# Patient Record
Sex: Female | Born: 1945
Health system: Southern US, Community
[De-identification: ages and names within clinical notes are randomized; demographics above are authoritative.]

## PROBLEM LIST (undated history)

## (undated) DIAGNOSIS — E039 Hypothyroidism, unspecified: Secondary | ICD-10-CM

## (undated) DIAGNOSIS — M1990 Unspecified osteoarthritis, unspecified site: Secondary | ICD-10-CM

## (undated) DIAGNOSIS — R011 Cardiac murmur, unspecified: Secondary | ICD-10-CM

## (undated) DIAGNOSIS — E7800 Pure hypercholesterolemia, unspecified: Secondary | ICD-10-CM

## (undated) DIAGNOSIS — Z8489 Family history of other specified conditions: Secondary | ICD-10-CM

## (undated) DIAGNOSIS — J449 Chronic obstructive pulmonary disease, unspecified: Secondary | ICD-10-CM

## (undated) DIAGNOSIS — I10 Essential (primary) hypertension: Secondary | ICD-10-CM

## (undated) DIAGNOSIS — N189 Chronic kidney disease, unspecified: Secondary | ICD-10-CM

## (undated) DIAGNOSIS — K219 Gastro-esophageal reflux disease without esophagitis: Secondary | ICD-10-CM

## (undated) HISTORY — PX: HIP SURGERY: SHX245

## (undated) HISTORY — PX: THYROID SURGERY: SHX805

## (undated) HISTORY — PX: EYE SURGERY: SHX253

## (undated) HISTORY — PX: TUBAL LIGATION: SHX77

## (undated) HISTORY — PX: BREAST CYST EXCISION: SHX579

## (undated) HISTORY — PX: TONSILLECTOMY: SUR1361

## (undated) HISTORY — PX: JOINT REPLACEMENT: SHX530

## (undated) HISTORY — PX: HEMORRHOID SURGERY: SHX153

---

## 2004-01-17 ENCOUNTER — Encounter: Admission: RE | Admit: 2004-01-17 | Discharge: 2004-01-17 | Payer: Self-pay | Admitting: Orthopedic Surgery

## 2004-03-12 ENCOUNTER — Inpatient Hospital Stay (HOSPITAL_COMMUNITY): Admission: RE | Admit: 2004-03-12 | Discharge: 2004-03-16 | Payer: Self-pay | Admitting: Orthopedic Surgery

## 2004-03-12 ENCOUNTER — Encounter (INDEPENDENT_AMBULATORY_CARE_PROVIDER_SITE_OTHER): Payer: Self-pay | Admitting: *Deleted

## 2008-06-16 ENCOUNTER — Emergency Department (HOSPITAL_BASED_OUTPATIENT_CLINIC_OR_DEPARTMENT_OTHER): Admission: EM | Admit: 2008-06-16 | Discharge: 2008-06-16 | Payer: Self-pay | Admitting: Emergency Medicine

## 2010-08-01 ENCOUNTER — Emergency Department (HOSPITAL_BASED_OUTPATIENT_CLINIC_OR_DEPARTMENT_OTHER): Admission: EM | Admit: 2010-08-01 | Discharge: 2010-08-01 | Payer: Self-pay | Admitting: Emergency Medicine

## 2011-01-16 NOTE — Discharge Summary (Signed)
NAME:  Alexandria Durham, Alexandria Durham                          ACCOUNT NO.:  [id number]   MEDICAL RECORD NO.:  [id number]                   PATIENT TYPE:  INP   LOCATION:  5040                                 FACILITY:  [facility]   PHYSICIAN:  Dyke Brackett, M.D.                 DATE OF BIRTH:  December 08, 1945   DATE OF ADMISSION:  03/12/2004  DATE OF DISCHARGE:  03/16/2004                                 DISCHARGE SUMMARY   ADMISSION DIAGNOSES:  1.  Avascular necrosis, bilateral femoral heads.  2.  Uncontrolled hypertension.  3.  Gastroesophageal reflux disease.  4.  Hypercholesterolemia.  5.  Eczema.  6.  Thyroid goiter.  7.  Sinus congestion.   DISCHARGE DIAGNOSES:  1.  Bilateral avascular necrosis femoral heads, status post right hip      arthroplasty.  2.  Right lesser trochanteric fracture, nondisplaced.  3.  Acute blood loss anemia secondary to surgery.  4.  Hypertension, uncontrolled.  5.  Gastroesophageal reflux disease.  6.  Hypercholesterolemia.  7.  Eczema.  8.  Thyroid goiter.  9.  Sinus congestion.   SURGICAL PROCEDURE:  On March 12, 2004, [name] underwent a right total  hip arthroplasty by Dr. Marcie Mowers, assisted by Jamelle Rushing, P.A.  She had a Pinnacle 100 series acetabular cup, size 48-mm, with an Apex hole  eliminator and then a Pinnacle Marathon acetabular liner lipped 48-mm outer  diameter, 28-mm inner diameter.  A Prodigy hip stem with Porocoat 13.5-mm  diameter, small stature with an Articulose femoral head 28-mm plus 5 neck  1214 cone.   COMPLICATIONS:  She did have a lesser trochanter calcar fracture that was  nondisplaced on the right hip.   CONSULTS:  1.  On March 13, 2004, she had a case management, physical therapy, and rehab      medicine consult.  2.  Occupational therapy consult, March 15, 2004.   HISTORY OF PRESENT ILLNESS:  This 65 year old black female patient presented  to Dr. Madelon Lips with a history of bilateral avascular necrosis of her  femoral  heads.  She did have a vascularized fibular graft done by Dr. Jiles Garter at  [facility] five years ago but the hip became infected.  Right hip pain has been  worse since that time.  It is a throbbing, burning sensation with radiation  to her right ankle.  The leg gives way at times.  She is unable to walk or  stand for long periods of time.  She has failed conservative treatment and  because of that she wishes to proceed with a right hip replacement.   HOSPITAL COURSE:  [name] tolerated her surgical procedure well and was  subsequently transferred to 5,000.  Post-op day #1, she was afebrile, vitals  stable.  Hemoglobin 11.6, hematocrit 33.  Right hip incision was well  approximated with staples without drainage.  Hemoglobin was low at 11.6  with  hematocrit of 33 and that was monitored.  Post-op day #2, she was feeling  better.  T-max 100, vitals stable.  Hemoglobin 10, hematocrit 29.3.  Right  hip wound benign.  She was switched to p.o. pain meds and continued on  therapy.  Post-op day #3, she was having some difficulty with nausea and  reflux.  She was started on Protonix.  She was having some constipation  which was treated effectively with meds.  Continued on therapy.  She  continued to do well over the next day or two.  It was felt she was ready  for discharge home on March 16, 2004, and was D/C'd home later that day.   DISCHARGE INSTRUCTIONS:   DIET:  She may resume her regular pre-hospitalization diet.   MEDICATIONS:  She may resume her pre-hospitalization meds with the exception  of her Darvocet, Celebrex, Motrin, Advil, and Mobic.  Other home meds were:  1.  Lipitor 10 mg p.o. q.a.m.  2.  Benicar 20 mg p.o. q.a.m.  3.  Hydroxyzine as needed p.r.n.  4.  Triamcinolone 0.1% cream apply to effected areas 1-2 times a day.   Additional medications include:  1.  Lovenox 40 mg subcu q.a.m. at 10 a.m.  2.  Protonix 40 mg p.o. q.a.m.  3.  Percocet 5/325 mg 1-2 tablets p.o.  q.4-6h. p.r.n. for pain.  4.  Robaxin 500 mg 1-2 tablets p.o. q.6-8h. p.r.n. for spasms.  5.  OxyContin 10 mg one tablet p.o. q.12h.  6.  Iron 325 mg one tablet p.o. q.a.m. for 30 days.   ACTIVITY:  She can be out of bed, partial weightbearing 50% or less on the  right leg with the use of a walker.  She is arranged for hom health PT per  [facility].   WOUND CARE:  She is to keep the right hip incision clean and dry.  She needs  to change the dressing daily and may shower after no drainage from the wound  for two days.  Please notify Dr. Madelon Lips if temp greater than 101.5, chills,  pain unrelieved by pain meds, or foul-smelling drainage from the wound.   FOLLOW UP:  1.  She needs to follow up with Dr. Madelon Lips in our office in approximately      11 days and needs to call [phone] for that appointment.  2.  She needs to follow up with her primary care doctor for her heartburn      and anemia complaints.   LABORATORY DATA:  X-ray taken of her right femur, on March 12, 2004, showed  normal alignment of the femoral stem of the hip prosthesis with no bony  fracture, alignment at the level of the knee joint is anatomic.   On March 13, 2004, hemoglobin 11.6, hematocrit 33, on the 15th white count  10.9, hemoglobin 10, hematocrit 29.3.  On March 15, 2004, white count 10.2,  hemoglobin 10.2, hematocrit 29.5, and platelets 235.  On March 10, 2004,  glucose 116.  On March 13, 2004, sodium 134, potassium 4.2, glucose 129.  On  March 14, 2004, glucose 129.  All cultures taken, on March 12, 2004, showed no  white cells, no organisms.  All other laboratory studies were within normal  limits.      Legrand Pitts Duffy, Arnetha Courser, M.D.    KED/MEDQ  D:  04/29/2004  T:  04/29/2004  Job:  [id number]   cc:   Casey Burkitt  [address]  [place]  [place] [address]  Fax: [phone]

## 2011-01-16 NOTE — H&P (Signed)
NAME:  Alexandria Durham, Alexandria Durham NO.:  [id number]   MEDICAL RECORD NO.:  [id number]                   PATIENT TYPE:  INP   LOCATION:                                       FACILITY:  [facility]   PHYSICIAN:  Dyke Brackett, M.D.                 DATE OF BIRTH:  03-02-1946   DATE OF ADMISSION:  03/12/2004  DATE OF DISCHARGE:                                HISTORY & PHYSICAL   CHIEF COMPLAINT:  My right hip pain.   HISTORY OF PRESENT ILLNESS:  Ms.  Durham is a 65 year old African American  female with bilateral AVN of her hips.  The patient has a history of  vascularized fibula graft that was done by Dr. Patric Dykes at [facility]  approximately five years ago.  The hip subsequently became infected.  The  patient states that the right hip pain was worse after surgery.  She  describes the right hip pain as a throbbing burning pain that radiates down  to her right ankle.  She denies any numbness or tingling in the right lower  extremity.  The right hip does give way at times.  The pain is worse after  sitting for a prolonged period of time or lying in bed on the right side.  She is unable to walk or stand for long periods of time due to the right hip  pain.  The pain does awaken her at night.  The pain is better with heat, and  she did get fair relief with Vioxx; however, since it has been removed from  the market, she has found no other NSAIDs that help with her pain.  She uses  a cane and/or walker as needed to ambulate.  X-rays of the right hip show  progressive arthritic changes.  The patient, therefore, is to undergo a  right total hip replacement at [facility] on March 12, 2004.   ALLERGIES:  1. ASPIRIN.  2. CODEINE.  Both cause tachycardia and sweating.   MEDICATIONS:  1. Lipitor 10 mg one p.o. every day.  2. Benicar 20 mg one p.o. every day.  3. Propoxyphene 100 p.r.n. max is six tablets a day.  4. Triamcinolone 0.1% apply to effected areas once to twice a  day.  5. Hydroxyzine p.r.n.  6. Celebrex 200 mg 1-2 p.o. every day p.r.n.  7. NSAID p.r.n. i.e., Mobic, Advil.   PAST MEDICAL HISTORY:  1. Hypertension.  2. GERD.  3. Hypercholesterolemia.  4. Eczema.  5. Thyroid goiter.  6. Sinus complaints.  The patient denies any coronary artery disease, diabetes mellitus,  neurologic, or hematologic medical issues.   PAST SURGICAL HISTORY:  1. Tonsillectomy.  2. Cyst removal, right breast.  3. Surgery for hemorrhoids.  4. Tubal ligation x 2.  5. Vascularized fibula graft, right hip.  The patient denies any problems with anesthesia with any of the above  procedures and did not require any blood products.   SOCIAL HISTORY:  The patient smokes presently one pack of cigarettes a day.  She denies any alcohol use.  She is married with three grown children.  The  patient lives in a two-story home with five-steps into the usual entrance.  Bedroom on the second floor.  The patient will move to downstairs after  surgery.  The patient works as an Production designer, theatre/television/film  __________ is employed at  the present time.   PRIMARY CARE PHYSICIAN:  Dr. Casey Burkitt in [place], [place].   FAMILY HISTORY:  Mother deceased at age 90 due to a CVA.  She had a history  of heart failure, hypertension.  Father deceased at age 40.  He had a  gastric ulcer.  The patient has a total of six siblings, three brothers and  three sisters.  One brother deceased accidentally.  Of the other two  brothers, one has hypertension.  The patient has one sister who is deceased  due to CVA and two sisters one has hypertension, the other has eczema,  asthma, coma, cataracts both eyes, questionable diabetes mellitus.   REVIEW OF SYSTEMS:  The patient denies any recent cold, cough, fever, flu  like symptoms.  She denies any chest pain, shortness of breath, PND,  orthopnea.  She has severely dry nasal mucosa, has post nasal drainage and  itchy eyes.  She does have reflux, however,  has no problems with the reflux  since being on Aciphex.  The patient denies any other GI or GU problems.   PHYSICAL EXAMINATION:  GENERAL:  The patient is a well developed well  nourished female who walks with an antalgic gait and limp on the right.  The  patient's mood and affect are appropriate.  She talks easily with examiner.  VITAL SIGNS:  The patient's approximate height is 5'6, weight 186 pounds.  Temp is 97.2, blood pressure is 160/100, repeat blood pressure 160/92, pulse  78, respiratory rate 16.  CARDIAC:  Regular rate and rhythm.  No murmurs, rubs or gallops noted.  LUNGS:  Clear to auscultation bilaterally.  No wheezing, rhonchi, or rales  are noted.  ABDOMEN:  Soft, round, nontender.  Positive bowel sounds x 4 quadrants.  BACK:  Nontender to palpation over the thoracic and lumbar spine.  NECK:  Trachea is midline.  No lymphadenopathy is noted.  Carotids are 2+  without bruits.  No tenderness on the cervical spine.  The patient has full  range of motion of the cervical spine.  BREAST/GENITOURINARY/RECTAL:  Deferred at this time.  NEUROLOGIC:  The patient is alert and oriented  x 3.  Cranial nerves II-XII  are grossly intact.  Upper and lower extremity strength testing reveals 5/5  strength against resistance bilaterally.  Deep tendon reflexes 2+  bilaterally in the upper and lower extremities.  MUSCULOSKELETAL:  Upper extremities are equal in size and shape.  The  patient's right shoulder forward flexion 170 degrees, abduction 180 degrees,  however, it is painful.  External rotation of the right shoulder is also  painful.  The left shoulder forward flexion 180 degrees, abduction 180  degrees.  Elbows, wrists, hands full range of motion.  Upper extremities are  neuromotor and vascular intact.  Lower extremities:  Left hip full range of  motion.  Right hip limited internal rotation.  External rotation approximately 40-45 degrees.  The lower extremities bilaterally are   neuromotor and vascular intact.  The patient does have  some right greater  trochanteric region of pain and some right SI joint pain.  EHL and FHL  intact bilaterally.  Dorsal pedal pulses are 2+ bilaterally.  There is no  edema in either of the lower extremities.   IMPRESSION:  1. Bilateral avascular necrosis, right hip pain greater than left.  X-rays     of the right hip show progressive arthritic changes.  2. Hypertension, uncontrolled.  3. Gastroesophageal reflux disease.  4. Hypercholesterolemia.  5. Eczema.  6. Thyroid goiter.  7. __________ complaints.   PLAN:  The patient is to be admitted to [facility] on March 12, 2004.  The patient is to undergo all preoperative lab work and testing prior  to surgery.  The patient will follow up with her primary care physician, Dr.  Lorri Frederick, prior to surgery to gain better control of her hypertension.  The  patient then will undergo a right total hip arthroplasty by Dr. Madelon Lips.      Richardean Canal, Arnetha Courser, M.D.    GC/MEDQ  D:  03/04/2004  T:  03/04/2004  Job:  [id number]

## 2011-01-16 NOTE — Op Note (Signed)
NAME:  Alexandria Durham, Alexandria Durham NO.:  [id number]   MEDICAL RECORD NO.:  [id number]                   PATIENT TYPE:  INP   LOCATION:  2899                                 FACILITY:  [facility]   PHYSICIAN:  Thera Flake., M.D.             DATE OF BIRTH:  08-Apr-1946   DATE OF PROCEDURE:  03/12/2004  DATE OF DISCHARGE:                                 OPERATIVE REPORT   PREOPERATIVE DIAGNOSIS:  Status post vascularized fibular bone graft with  failure of above.   POSTOPERATIVE DIAGNOSIS:  Status post vascularized fibular bone graft with  failure of above.   OPERATION PERFORMED:  Conversion of vascularized bone graft with removal of  pin and vascularized fibula and conversion to total hip replacement, right-  sided (Depuy Prodigy stem 13.5 narrow, +5 mm neck length, 28 mm head with 48  mm acetabulum).   SURGEON:  Dyke Brackett, M.D.   ASSISTANT:  Jamelle Rushing, P.A.   ANESTHESIA:  General.   ESTIMATED BLOOD LOSS:  Approximately 200 mL.   DESCRIPTION OF PROCEDURE:  The patient was thoroughly prepped and draped in  the lateral position.  She had a curvilinear incision made, straight skin  incision on the thigh with posterior approach to the hip.  She had the  iliotibial band split, identification of the tendon to fibula which was  removed, pin in the femur from the fibular bone graft which was removed.  The neck was cut conservatively but there was really very little neck left  relative to collapse of the femur and the previous bone graft.  Again, there  was a lot of excessive bone from the cortical screw to the fibula requiring  careful removal of the fibular bone graft with reamers to avoid any  eccentric placement of the reamer down the canal.  The head was cut, sent to  pathology.  There was a previous history of the patient having had an  infection after a vascularized bone graft but frozen section of the tissue  showed only a few polys, nothing  significant for high powered field.  Additionally, there was no evidence of gross purulence; however,  representative tissue was sent for permanent culture as well as to pathology  in terms of the bone.  Progressive reaming and rasping up.  Prosthesis had  to be placed probably about 1/2 fingerbreadth proud due to very hard bone  and a good distal fit reamed up to 13.0 to accept a 13.5 mm stem.  Acetabulum was identified.  Excess soft tissue was removed from the  acetabulum.  The acetabulum itself was moderately degenerative.  Again, it  was reamed up 47 mm to accept a 48 mm prosthesis down to good bleeding bone  with an appropriate amount of anteversion and abduction.  Excellent  stability was obtained although obviously given the size of the acetabulum,  we could only use a 28  mm head but really it was very difficult to dislocate  this head once the trials were placed with a +5 mm neck length.  Final  components were inserted after the trial acetabular followed by femur.  On  insertion of the femoral prosthesis, there was a small crack noted in the  calcar; however, plain film examination of the entire femoral canal did not  demonstrate any propagation of this calcar crack into the shaft of bone.  Closure was effected on the capsule with #1 Ethibond on the iliotibial band,  2-0 Vicryl, skin clips, Marcaine with epinephrine infiltrated in the skin.  Lightly compressive sterile dressing applied.                                               Thera Flake., M.D.    WDC/MEDQ  D:  03/12/2004  T:  03/12/2004  Job:  [id number]

## 2011-03-31 ENCOUNTER — Encounter: Payer: Self-pay | Admitting: Emergency Medicine

## 2011-03-31 ENCOUNTER — Emergency Department (HOSPITAL_BASED_OUTPATIENT_CLINIC_OR_DEPARTMENT_OTHER)
Admission: EM | Admit: 2011-03-31 | Discharge: 2011-03-31 | Disposition: A | Discharge status: Home / Self Care | Payer: Self-pay | Attending: Emergency Medicine | Admitting: Emergency Medicine

## 2011-03-31 ENCOUNTER — Emergency Department (INDEPENDENT_AMBULATORY_CARE_PROVIDER_SITE_OTHER): Payer: Self-pay

## 2011-03-31 DIAGNOSIS — J029 Acute pharyngitis, unspecified: Secondary | ICD-10-CM | POA: Insufficient documentation

## 2011-03-31 DIAGNOSIS — I10 Essential (primary) hypertension: Secondary | ICD-10-CM

## 2011-03-31 DIAGNOSIS — R05 Cough: Secondary | ICD-10-CM | POA: Insufficient documentation

## 2011-03-31 DIAGNOSIS — R0989 Other specified symptoms and signs involving the circulatory and respiratory systems: Secondary | ICD-10-CM

## 2011-03-31 DIAGNOSIS — J40 Bronchitis, not specified as acute or chronic: Secondary | ICD-10-CM | POA: Insufficient documentation

## 2011-03-31 DIAGNOSIS — R059 Cough, unspecified: Secondary | ICD-10-CM | POA: Insufficient documentation

## 2011-03-31 DIAGNOSIS — R5381 Other malaise: Secondary | ICD-10-CM

## 2011-03-31 HISTORY — DX: Essential (primary) hypertension: I10

## 2011-03-31 HISTORY — DX: Pure hypercholesterolemia, unspecified: E78.00

## 2011-03-31 MED ORDER — AZITHROMYCIN 250 MG PO TABS: 250.0000 mg | ORAL_TABLET | Freq: Every day | ORAL | Status: AC

## 2011-03-31 NOTE — ED Notes (Signed)
Pt states she has been having productive cough x 1 month.  Sore throat since yesterday.  No known fever, No SOB.

## 2011-03-31 NOTE — ED Provider Notes (Signed)
Medical screening examination/treatment/procedure(s) were performed by non-physician practitioner and as supervising physician I was immediately available for consultation/collaboration.   Charles B. Bernette Mayers, MD 03/31/11 1336

## 2011-03-31 NOTE — ED Provider Notes (Signed)
History     Chief Complaint  Patient presents with  . Sore Throat  . Cough   Patient is a 65 y.o. female presenting with pharyngitis and cough. The history is provided by the patient.  Sore Throat This is a new problem. The current episode started in the past 7 days. The problem occurs constantly. The problem has been gradually worsening. Associated symptoms include congestion, coughing and a sore throat. Pertinent negatives include no fever. The symptoms are aggravated by nothing. She has tried nothing for the symptoms. The treatment provided no relief.  Cough This is a new problem. The current episode started more than 1 week ago. The problem occurs constantly. The problem has been gradually improving. There has been no fever. Associated symptoms include rhinorrhea and sore throat. She is not a smoker.    Past Medical History  Diagnosis Date  . Hypertension   . Diabetes mellitus   . Hypercholesteremia     Past Surgical History  Procedure Date  . Tonsillectomy   . Joint replacement     hip replacement  . Tubal ligation     History reviewed. No pertinent family history.  History  Substance Use Topics  . Smoking status: Current Everyday Smoker -- 0.5 packs/day for 25 years  . Smokeless tobacco: Not on file  . Alcohol Use: No    OB History    Grav Para Term Preterm Abortions TAB SAB Ect Mult Living                  Review of Systems  Constitutional: Negative for fever.  HENT: Positive for congestion, sore throat and rhinorrhea.   Respiratory: Positive for cough.   All other systems reviewed and are negative.    Physical Exam  BP 127/57  Pulse 87  Temp(Src) 98.1 F (36.7 C) (Oral)  SpO2 99%  Physical Exam  Constitutional: She is oriented to person, place, and time. She appears well-developed and well-nourished.  HENT:  Head: Normocephalic and atraumatic.  Eyes: Conjunctivae and EOM are normal. Pupils are equal, round, and reactive to light.  Neck: Normal  range of motion. Neck supple.  Cardiovascular: Normal rate.   Pulmonary/Chest: Effort normal and breath sounds normal.  Abdominal: Soft.  Musculoskeletal: Normal range of motion.  Neurological: She is alert and oriented to person, place, and time. She has normal reflexes.  Skin: Skin is warm and dry.  Psychiatric: She has a normal mood and affect.    ED Course  Procedures  MDM Chest xray no pneumonia    Results for orders placed during the hospital encounter of 08/01/10  GLUCOSE, CAPILLARY      Component Value Range   Glucose-Capillary 110 (*) 70 - 99 (mg/dL)   Comment 1 Notify RN     Dg Chest 2 View  03/31/2011  *RADIOLOGY REPORT*  Clinical Data: Coughing.  Congestion.  Weakness.  Hypertension. Diabetes.  Tobacco smoker.  CHEST - 2 VIEW  Comparison: 03/10/2004.  Findings: The cardiac silhouette is normal size and shape. Mediastinal and hilar contours appear stable. No pleural abnormality is evident.  Lungs are free of infiltrates.  There is minimal degenerative spondylosis.  IMPRESSION: No cardiopulmonary abnormalities are identified.  Original Report Authenticated By: Crawford Givens, M.D.      Langston Masker, [place] 03/31/11 1330

## 2011-04-04 ENCOUNTER — Encounter (HOSPITAL_BASED_OUTPATIENT_CLINIC_OR_DEPARTMENT_OTHER): Payer: Self-pay | Admitting: *Deleted

## 2011-04-04 ENCOUNTER — Emergency Department (HOSPITAL_BASED_OUTPATIENT_CLINIC_OR_DEPARTMENT_OTHER)
Admission: EM | Admit: 2011-04-04 | Discharge: 2011-04-04 | Disposition: A | Discharge status: Home / Self Care | Payer: Self-pay | Attending: Emergency Medicine | Admitting: Emergency Medicine

## 2011-04-04 DIAGNOSIS — E119 Type 2 diabetes mellitus without complications: Secondary | ICD-10-CM | POA: Insufficient documentation

## 2011-04-04 DIAGNOSIS — I10 Essential (primary) hypertension: Secondary | ICD-10-CM | POA: Insufficient documentation

## 2011-04-04 DIAGNOSIS — T363X5A Adverse effect of macrolides, initial encounter: Secondary | ICD-10-CM | POA: Insufficient documentation

## 2011-04-04 DIAGNOSIS — F17200 Nicotine dependence, unspecified, uncomplicated: Secondary | ICD-10-CM | POA: Insufficient documentation

## 2011-04-04 DIAGNOSIS — T783XXA Angioneurotic edema, initial encounter: Secondary | ICD-10-CM | POA: Insufficient documentation

## 2011-04-04 DIAGNOSIS — E7800 Pure hypercholesterolemia, unspecified: Secondary | ICD-10-CM | POA: Insufficient documentation

## 2011-04-04 MED ORDER — DIPHENHYDRAMINE HCL 25 MG PO CAPS
ORAL_CAPSULE | ORAL | Status: AC
  Filled 2011-04-04: qty 1

## 2011-04-04 MED ORDER — DIPHENHYDRAMINE HCL 25 MG PO CAPS
ORAL_CAPSULE | ORAL | Status: AC
  Administered 2011-04-04: 50 mg via ORAL
  Filled 2011-04-04: qty 2

## 2011-04-04 MED ORDER — PREDNISONE 20 MG PO TABS
60.0000 mg | ORAL_TABLET | Freq: Once | ORAL | Status: AC
  Administered 2011-04-04: 60 mg via ORAL
  Filled 2011-04-04: qty 3

## 2011-04-04 MED ORDER — DIPHENHYDRAMINE HCL 25 MG PO TABS: 25.0000 mg | ORAL_TABLET | Freq: Four times a day (QID) | ORAL | Status: AC

## 2011-04-04 MED ORDER — FAMOTIDINE 20 MG PO TABS: 20.0000 mg | ORAL_TABLET | Freq: Two times a day (BID) | ORAL | Status: DC

## 2011-04-04 MED ORDER — FAMOTIDINE 20 MG PO TABS
20.0000 mg | ORAL_TABLET | Freq: Once | ORAL | Status: AC
  Administered 2011-04-04: 20 mg via ORAL
  Filled 2011-04-04: qty 1

## 2011-04-04 MED ORDER — PREDNISONE 10 MG PO TABS: 20.0000 mg | ORAL_TABLET | Freq: Every day | ORAL | Status: AC

## 2011-04-04 MED ORDER — DIPHENHYDRAMINE HCL 50 MG PO CAPS
50.0000 mg | ORAL_CAPSULE | Freq: Once | ORAL | Status: AC
  Administered 2011-04-04: 50 mg via ORAL
  Filled 2011-04-04: qty 1

## 2011-04-04 NOTE — ED Notes (Signed)
Pt reports swelling to lips since yesterday. States seen at [facility] and given Zithromax, has been taking since Tuesday. No difficulty breathing, no rash reported.

## 2011-04-04 NOTE — ED Provider Notes (Signed)
History     CSN: [id number] Arrival date & time: 04/04/2011  2:42 AM  Chief Complaint  Patient presents with  . Allergic Reaction   Patient is a 65 y.o. female presenting with allergic reaction. The history is provided by the patient and a relative.  Allergic Reaction The primary symptoms are  angioedema. The primary symptoms do not include wheezing, shortness of breath, cough, abdominal pain, nausea, vomiting, diarrhea, dizziness, palpitations, altered mental status, rash or urticaria. The current episode started yesterday. The problem has not changed since onset. The angioedema is not associated with shortness of breath.  The onset of the reaction was associated with a new medication. Significant symptoms that are not present include eye redness, flushing or itching.  STARTED ZITHROMAX ON Tuesday BUT SX DID NOT START UNTIL Friday. ALSO STARTED LISINOPRIL 7 MONTHS AGO FOR HTN. SX ISOLATED TO JUST THE UPPER LIP BEING SWOLLEN. THE ZITHROMAX WAS FOR BRONCHITIS AND CXR WAS NEGATIVE. ONLY HAS ONE DOSE LAST.   Past Medical History  Diagnosis Date  . Hypertension   . Diabetes mellitus   . Hypercholesteremia     Past Surgical History  Procedure Date  . Tonsillectomy   . Joint replacement     hip replacement  . Tubal ligation     History reviewed. No pertinent family history.  History  Substance Use Topics  . Smoking status: Current Everyday Smoker -- 0.5 packs/day for 25 years  . Smokeless tobacco: Not on file  . Alcohol Use: No    OB History    Grav Para Term Preterm Abortions TAB SAB Ect Mult Living                  Review of Systems  Constitutional: Negative for fever.  HENT: Positive for facial swelling. Negative for congestion, sneezing and neck pain.   Eyes: Negative for redness.  Respiratory: Negative for cough, shortness of breath and wheezing.   Cardiovascular: Negative for chest pain and palpitations.  Gastrointestinal: Negative for nausea, vomiting, abdominal  pain and diarrhea.  Genitourinary: Negative for dysuria and hematuria.  Musculoskeletal: Negative for back pain, joint swelling and arthralgias.  Skin: Negative for flushing, itching and rash.  Neurological: Negative for dizziness, speech difficulty and headaches.  Hematological: Negative for adenopathy.  Psychiatric/Behavioral: Negative for altered mental status.    Physical Exam  BP 143/68  Pulse 89  Temp(Src) 98.8 F (37.1 C) (Oral)  Resp 17  Ht 5\' 4"  (1.626 m)  Wt 160 lb (72.576 kg)  BMI 27.46 kg/m2  SpO2 99%  Physical Exam  Constitutional: She is oriented to person, place, and time. She appears well-developed and well-nourished.  HENT:  Head: Normocephalic and atraumatic.  Mouth/Throat: Oropharynx is clear and moist.       EX FOR UPPER LIP SWELLING RIGHT GREATER THAN LEFT. NO TONGUE SWELLING.   Eyes: Conjunctivae and EOM are normal. Pupils are equal, round, and reactive to light.  Neck: Normal range of motion. Neck supple.  Cardiovascular: Normal rate, regular rhythm and normal heart sounds.   Pulmonary/Chest: Effort normal and breath sounds normal. No respiratory distress. She has no wheezes.  Abdominal: Soft. Bowel sounds are normal. There is no tenderness.  Musculoskeletal: Normal range of motion. She exhibits no edema and no tenderness.  Neurological: She is alert and oriented to person, place, and time. She displays normal reflexes. No cranial nerve deficit. She exhibits normal muscle tone.  Skin: Skin is warm and dry. No rash noted. No erythema.  ED Course  Procedures  MDM ANGIOEDEMA MAYBE RELATED TO ZITHRO, CAN STOP THAT. BUT I SUSPECT RELATED TO LISINOPRIL SINCE NOT OTHER ALLERGIC SYMPTOMS AND HAS BEEN ON THE ZITHRO FOR 4 DAYS ALREADY.       Shelda Jakes, MD 04/04/11 [phone]

## 2014-05-21 ENCOUNTER — Other Ambulatory Visit: Payer: Self-pay | Admitting: Internal Medicine

## 2014-05-21 DIAGNOSIS — M170 Bilateral primary osteoarthritis of knee: Secondary | ICD-10-CM

## 2014-05-30 ENCOUNTER — Ambulatory Visit
Admission: RE | Admit: 2014-05-30 | Discharge: 2014-05-30 | Disposition: A | Discharge status: Home / Self Care | Payer: No Typology Code available for payment source | Source: Ambulatory Visit | Attending: Unknown Physician Specialty | Admitting: Unknown Physician Specialty

## 2014-05-30 DIAGNOSIS — M170 Bilateral primary osteoarthritis of knee: Secondary | ICD-10-CM

## 2014-09-19 ENCOUNTER — Other Ambulatory Visit: Payer: Self-pay | Admitting: Internal Medicine

## 2014-09-19 DIAGNOSIS — M1990 Unspecified osteoarthritis, unspecified site: Secondary | ICD-10-CM

## 2014-10-04 ENCOUNTER — Ambulatory Visit
Admission: RE | Admit: 2014-10-04 | Discharge: 2014-10-04 | Disposition: A | Discharge status: Home / Self Care | Payer: No Typology Code available for payment source | Source: Ambulatory Visit | Attending: Unknown Physician Specialty | Admitting: Unknown Physician Specialty

## 2014-10-04 DIAGNOSIS — M1990 Unspecified osteoarthritis, unspecified site: Secondary | ICD-10-CM

## 2015-02-14 ENCOUNTER — Other Ambulatory Visit: Payer: Self-pay | Admitting: Internal Medicine

## 2015-02-14 DIAGNOSIS — M179 Osteoarthritis of knee, unspecified: Secondary | ICD-10-CM

## 2015-02-14 DIAGNOSIS — M1710 Unilateral primary osteoarthritis, unspecified knee: Secondary | ICD-10-CM

## 2015-02-28 ENCOUNTER — Ambulatory Visit
Admission: RE | Admit: 2015-02-28 | Discharge: 2015-02-28 | Disposition: A | Discharge status: Home / Self Care | Payer: No Typology Code available for payment source | Source: Ambulatory Visit | Attending: Internal Medicine | Admitting: Internal Medicine

## 2015-02-28 DIAGNOSIS — M179 Osteoarthritis of knee, unspecified: Secondary | ICD-10-CM

## 2015-02-28 DIAGNOSIS — M1710 Unilateral primary osteoarthritis, unspecified knee: Secondary | ICD-10-CM

## 2015-08-28 DIAGNOSIS — E119 Type 2 diabetes mellitus without complications: Secondary | ICD-10-CM

## 2015-08-28 DIAGNOSIS — E1142 Type 2 diabetes mellitus with diabetic polyneuropathy: Secondary | ICD-10-CM | POA: Diagnosis present

## 2015-08-28 DIAGNOSIS — L309 Dermatitis, unspecified: Secondary | ICD-10-CM | POA: Insufficient documentation

## 2015-11-21 DIAGNOSIS — I10 Essential (primary) hypertension: Secondary | ICD-10-CM | POA: Diagnosis present

## 2015-12-31 DIAGNOSIS — E559 Vitamin D deficiency, unspecified: Secondary | ICD-10-CM | POA: Diagnosis present

## 2016-01-01 DIAGNOSIS — I129 Hypertensive chronic kidney disease with stage 1 through stage 4 chronic kidney disease, or unspecified chronic kidney disease: Secondary | ICD-10-CM | POA: Insufficient documentation

## 2016-02-24 DIAGNOSIS — R011 Cardiac murmur, unspecified: Secondary | ICD-10-CM | POA: Insufficient documentation

## 2016-03-01 ENCOUNTER — Encounter (HOSPITAL_BASED_OUTPATIENT_CLINIC_OR_DEPARTMENT_OTHER): Payer: Self-pay

## 2016-03-01 ENCOUNTER — Emergency Department (HOSPITAL_BASED_OUTPATIENT_CLINIC_OR_DEPARTMENT_OTHER): Payer: Medicare Other

## 2016-03-01 ENCOUNTER — Emergency Department (HOSPITAL_BASED_OUTPATIENT_CLINIC_OR_DEPARTMENT_OTHER)
Admission: EM | Admit: 2016-03-01 | Discharge: 2016-03-02 | Disposition: A | Discharge status: Other Acute Inpatient Hospital | Payer: Medicare Other | Attending: Emergency Medicine | Admitting: Emergency Medicine

## 2016-03-01 DIAGNOSIS — R197 Diarrhea, unspecified: Secondary | ICD-10-CM | POA: Diagnosis not present

## 2016-03-01 DIAGNOSIS — Z7984 Long term (current) use of oral hypoglycemic drugs: Secondary | ICD-10-CM | POA: Diagnosis not present

## 2016-03-01 DIAGNOSIS — E119 Type 2 diabetes mellitus without complications: Secondary | ICD-10-CM | POA: Insufficient documentation

## 2016-03-01 DIAGNOSIS — F17200 Nicotine dependence, unspecified, uncomplicated: Secondary | ICD-10-CM | POA: Diagnosis not present

## 2016-03-01 DIAGNOSIS — R110 Nausea: Secondary | ICD-10-CM | POA: Insufficient documentation

## 2016-03-01 DIAGNOSIS — Z79899 Other long term (current) drug therapy: Secondary | ICD-10-CM | POA: Insufficient documentation

## 2016-03-01 DIAGNOSIS — I10 Essential (primary) hypertension: Secondary | ICD-10-CM | POA: Diagnosis not present

## 2016-03-01 DIAGNOSIS — R1084 Generalized abdominal pain: Secondary | ICD-10-CM | POA: Insufficient documentation

## 2016-03-01 LAB — URINE MICROSCOPIC-ADD ON

## 2016-03-01 LAB — URINALYSIS, ROUTINE W REFLEX MICROSCOPIC
BILIRUBIN URINE: NEGATIVE
Glucose, UA: NEGATIVE mg/dL
Ketones, ur: NEGATIVE mg/dL
Nitrite: NEGATIVE
PROTEIN: 30 mg/dL — AB
Specific Gravity, Urine: 1.019 (ref 1.005–1.030)
pH: 7 (ref 5.0–8.0)

## 2016-03-01 LAB — CBC WITH DIFFERENTIAL/PLATELET
Basophils Absolute: 0 10*3/uL (ref 0.0–0.1)
Basophils Relative: 0 %
Eosinophils Absolute: 0 10*3/uL (ref 0.0–0.7)
Eosinophils Relative: 0 %
HCT: 38.6 % (ref 36.0–46.0)
Hemoglobin: 13.2 g/dL (ref 12.0–15.0)
Lymphocytes Relative: 34 %
Lymphs Abs: 2.4 10*3/uL (ref 0.7–4.0)
MCH: 30.7 pg (ref 26.0–34.0)
MCHC: 34.2 g/dL (ref 30.0–36.0)
MCV: 89.8 fL (ref 78.0–100.0)
Monocytes Absolute: 0.4 10*3/uL (ref 0.1–1.0)
Monocytes Relative: 6 %
Neutro Abs: 4.3 10*3/uL (ref 1.7–7.7)
Neutrophils Relative %: 60 %
Platelets: 287 10*3/uL (ref 150–400)
RBC: 4.3 MIL/uL (ref 3.87–5.11)
RDW: 13.6 % (ref 11.5–15.5)
WBC: 7.1 10*3/uL (ref 4.0–10.5)

## 2016-03-01 LAB — COMPREHENSIVE METABOLIC PANEL
ALT: 29 U/L (ref 14–54)
AST: 26 U/L (ref 15–41)
Albumin: 4 g/dL (ref 3.5–5.0)
Alkaline Phosphatase: 50 U/L (ref 38–126)
Anion gap: 9 (ref 5–15)
BUN: 16 mg/dL (ref 6–20)
CO2: 25 mmol/L (ref 22–32)
Calcium: 9.9 mg/dL (ref 8.9–10.3)
Chloride: 103 mmol/L (ref 101–111)
Creatinine, Ser: 1.07 mg/dL — ABNORMAL HIGH (ref 0.44–1.00)
GFR calc Af Amer: >60 mL/min (ref >60)
GFR calc non Af Amer: 52 mL/min — ABNORMAL LOW (ref >60)
Glucose, Bld: 117 mg/dL — ABNORMAL HIGH (ref 65–99)
Potassium: 3.8 mmol/L (ref 3.5–5.1)
Sodium: 137 mmol/L (ref 135–145)
Total Bilirubin: 0.6 mg/dL (ref 0.3–1.2)
Total Protein: 8.3 g/dL — ABNORMAL HIGH (ref 6.5–8.1)

## 2016-03-01 LAB — LIPASE, BLOOD: Lipase: 28 U/L (ref 11–51)

## 2016-03-01 MED ORDER — MORPHINE SULFATE (PF) 2 MG/ML IV SOLN
2.0000 mg | Freq: Once | INTRAVENOUS | Status: AC
  Administered 2016-03-01: 2 mg via INTRAVENOUS
  Filled 2016-03-01: qty 1

## 2016-03-01 MED ORDER — DEXTROSE 5 % IV SOLN
1.0000 g | Freq: Once | INTRAVENOUS | Status: AC
  Administered 2016-03-01: 1 g via INTRAVENOUS
  Filled 2016-03-01: qty 10

## 2016-03-01 MED ORDER — FENTANYL CITRATE (PF) 100 MCG/2ML IJ SOLN
50.0000 ug | Freq: Once | INTRAMUSCULAR | Status: AC
  Administered 2016-03-01: 50 ug via INTRAVENOUS
  Filled 2016-03-01: qty 2

## 2016-03-01 MED ORDER — IOPAMIDOL (ISOVUE-300) INJECTION 61%
100.0000 mL | Freq: Once | INTRAVENOUS | Status: AC | PRN
  Administered 2016-03-01: 100 mL via INTRAVENOUS

## 2016-03-01 MED ORDER — PROMETHAZINE HCL 25 MG/ML IJ SOLN
12.5000 mg | Freq: Once | INTRAMUSCULAR | Status: AC
  Administered 2016-03-01: 12.5 mg via INTRAVENOUS
  Filled 2016-03-01: qty 1

## 2016-03-01 MED ORDER — SODIUM CHLORIDE 0.9 % IV BOLUS (SEPSIS)
1000.0000 mL | Freq: Once | INTRAVENOUS | Status: AC
  Administered 2016-03-01: 1000 mL via INTRAVENOUS

## 2016-03-01 MED ORDER — ONDANSETRON HCL 4 MG/2ML IJ SOLN
4.0000 mg | Freq: Once | INTRAMUSCULAR | Status: AC
  Administered 2016-03-01: 4 mg via INTRAVENOUS
  Filled 2016-03-01: qty 2

## 2016-03-01 NOTE — ED Notes (Signed)
Pt ambulated to bathroom and back with standby-assist from family.

## 2016-03-01 NOTE — ED Notes (Signed)
abd pain, nausea x 3 days-NAD-steady gait

## 2016-03-01 NOTE — ED Provider Notes (Signed)
CSN: [id number]     Arrival date & time 03/01/16  1628 History  By signing my name below, I, Soijett Blue, attest that this documentation has been prepared under the direction and in the presence of Burna FortsJeff Rylin Seavey, PA-C Electronically Signed: Soijett Blue, ED Scribe. 03/01/2016. 4:54 PM.   Chief Complaint  Patient presents with  . Abdominal Pain   The history is provided by the patient. No language interpreter was used.    HPI Comments: Alexandria Durham is a 70 y.o. female with a medical hx of HTN, DM, and hypercholesteremia, who presents to the Emergency Department complaining of constant, moderate, generalized, abdominal pain onset 3 days. Pt states that she had cold-like symptoms prior to the onset of her abdominal pain. Pt reports that she self-treated her cold-like symptoms with OTC medication with her last dose being 2 days ago. Pt states that her last colonoscopy at the medical center in [place] and she was informed that her next one would be in 10 years. She states that she is having associated symptoms of nausea, diarrhea, and appetite change due to pain. She states that she has not tried any medications for the relief for her symptoms. She denies constipation, dysuria, fever, vomiting, and any other symptoms. Denies drinking ETOH. Denies sick contacts. Pt notes that she has had two tubal ligations in the past.   Past Medical History  Diagnosis Date  . Hypertension   . Diabetes mellitus   . Hypercholesteremia    Past Surgical History  Procedure Laterality Date  . Tonsillectomy    . Joint replacement      hip replacement  . Tubal ligation    . Breast cyst excision    . Hemorrhoid surgery     No family history on file. Social History  Substance Use Topics  . Smoking status: Current Every Day Smoker -- 0.00 packs/day for 0 years  . Smokeless tobacco: None  . Alcohol Use: No   OB History    No data available     Review of Systems  Constitutional: Positive for appetite  change (due to pain). Negative for fever.  Gastrointestinal: Positive for nausea, abdominal pain and diarrhea. Negative for vomiting and constipation.  Genitourinary: Negative for dysuria.     Allergies  Aspirin; Codeine; and Penicillins  Home Medications   Prior to Admission medications   Medication Sig Start Date End Date Taking? Authorizing Provider  atorvastatin (LIPITOR) 20 MG tablet Take 20 mg by mouth daily.   Yes Historical Provider, MD  buPROPion (WELLBUTRIN SR) 150 MG 12 hr tablet Take 150 mg by mouth 2 (two) times daily.   Yes Historical Provider, MD  hydrochlorothiazide (HYDRODIURIL) 50 MG tablet Take 50 mg by mouth daily.   Yes Historical Provider, MD  metFORMIN (GLUMETZA) 500 MG (MOD) 24 hr tablet Take 500 mg by mouth daily with breakfast.     Yes Historical Provider, MD  omeprazole (PRILOSEC) 20 MG capsule Take 20 mg by mouth daily.   Yes Historical Provider, MD  promethazine-phenylephrine (PROMETHAZINE VC) 6.25-5 MG/5ML SYRP Take by mouth every 4 (four) hours as needed for congestion.   Yes Historical Provider, MD  valsartan (DIOVAN) 80 MG tablet Take 80 mg by mouth daily.   Yes Historical Provider, MD   BP 164/81 mmHg  Pulse 90  Temp(Src) 98.7 F (37.1 C) (Oral)  Resp 18  Ht 5\' 3"  (1.6 m)  Wt 78.019 kg  BMI 30.48 kg/m2  SpO2 96%   Physical Exam  Constitutional: She is oriented to person, place, and time. She appears well-developed and well-nourished. No distress.  HENT:  Head: Normocephalic and atraumatic.  Eyes: EOM are normal.  Neck: Neck supple.  Cardiovascular: Normal rate.   Pulmonary/Chest: Effort normal. No respiratory distress.  Abdominal: Soft. She exhibits no distension. There is tenderness.  Diffusely tender. No focal abnormalities.  Musculoskeletal: Normal range of motion.  Neurological: She is alert and oriented to person, place, and time.  Skin: Skin is warm and dry.  Psychiatric: She has a normal mood and affect. Her behavior is normal.   Nursing note and vitals reviewed.   ED Course  Procedures (including critical care time) DIAGNOSTIC STUDIES: Oxygen Saturation is 100% on RA, nl by my interpretation.    COORDINATION OF CARE: 4:51 PM Discussed treatment plan with pt at bedside which includes UA, CT abdomen pelvis, and labs, and pt agreed to plan.   Labs Review Labs Reviewed  URINALYSIS, ROUTINE W REFLEX MICROSCOPIC (NOT AT [facility]) - Abnormal; Notable for the following:    APPearance CLOUDY (*)    Hgb urine dipstick MODERATE (*)    Protein, ur 30 (*)    Leukocytes, UA MODERATE (*)    All other components within normal limits  COMPREHENSIVE METABOLIC PANEL - Abnormal; Notable for the following:    Glucose, Bld 117 (*)    Creatinine, Ser 1.07 (*)    Total Protein 8.3 (*)    GFR calc non Af Amer 52 (*)    All other components within normal limits  URINE MICROSCOPIC-ADD ON - Abnormal; Notable for the following:    Squamous Epithelial / LPF 6-30 (*)    Bacteria, UA MANY (*)    All other components within normal limits  CBC WITH DIFFERENTIAL/PLATELET  LIPASE, BLOOD   Imaging Review Ct Abdomen Pelvis W Contrast  03/01/2016  CLINICAL DATA:  Acute onset of generalized abdominal pain and nausea. Initial encounter. EXAM: CT ABDOMEN AND PELVIS WITH CONTRAST TECHNIQUE: Multidetector CT imaging of the abdomen and pelvis was performed using the standard protocol following bolus administration of intravenous contrast. CONTRAST:  ISOVUE-300 IOPAMIDOL (ISOVUE-300) INJECTION 61% COMPARISON:  Right renal ultrasound performed 06/06/2014 FINDINGS: The visualized lung bases are clear. The liver and spleen are unremarkable in appearance. The gallbladder is within normal limits. The pancreas and adrenal glands are unremarkable. The kidneys are unremarkable in appearance. There is no evidence of hydronephrosis. No renal or ureteral stones are seen. No perinephric stranding is appreciated. No free fluid is identified. The small bowel is  unremarkable in appearance. The stomach is within normal limits. No acute vascular abnormalities are seen. Scattered calcification is seen along the abdominal aorta and its branches. The appendix is normal in caliber, without evidence of appendicitis. Diffuse diverticulosis is noted along the entirety of the colon, without evidence of diverticulitis. The bladder is moderately distended and grossly unremarkable. The uterus is grossly unremarkable in appearance. The ovaries are relatively symmetric. No suspicious adnexal masses are seen. No inguinal lymphadenopathy is seen. No acute osseous abnormalities are identified. The patient's right hip arthroplasty is grossly unremarkable in appearance. Facet disease is noted at the lower lumbar spine, with grade 1 anterolisthesis of L4 on L5. IMPRESSION: 1. No acute abnormality seen within the abdomen or pelvis. 2. Diffuse diverticulosis along the entirety of the colon, without evidence of diverticulitis. 3. Scattered calcification along the abdominal aorta and its branches. Electronically Signed   By: Roanna Raider M.D.   On: 03/01/2016 18:57  I have personally reviewed and evaluated these images and lab results as part of my medical decision-making.    MDM   Final diagnoses:  Generalized abdominal pain    Labs: UA significant for cloudy appearance, moderate amount of hgb, elevated protien, many bacteria, and moderate amount of leukocytes.  Imaging: CT abdomen pelvis significant for 1. No acute abnormality. 2. Diffuse diverticulosis along entirety of colon without evidence of diverticulitis. 3. Scattered calcification along abdominal aorta and its branches.  Consults: [facility]   Therapeutics: IV fluids, morphine, zofran, iopamidol, fentanyl, rocephin, and phenergan  Discharge Meds:   Assessment/Plan:  Pt presents Today with abdominal pain, nausea and vomiting. Patient has been unable tolerate by mouth here in the ED after numerous  attempts at antiemetics. Patient labs significant for urinary tract infection, no other significant etiology noted on today's exam. Due to inability to tolerate by mouth continued discomfort, and vomiting patient will be [facility] at her request. Patient vital signs stable here, she is afebrile.  I personally performed the services described in this documentation, which was scribed in my presence. The recorded information has been reviewed and is accurate.    Eyvonne MechanicJeffrey Marleena Shubert, PA-C 03/02/16 [id number]  Loren Raceravid Yelverton, MD 03/03/16 [phone]

## 2016-03-01 NOTE — ED Notes (Signed)
PA at bedside.

## 2016-03-02 DIAGNOSIS — K219 Gastro-esophageal reflux disease without esophagitis: Secondary | ICD-10-CM | POA: Diagnosis present

## 2016-03-02 DIAGNOSIS — R197 Diarrhea, unspecified: Secondary | ICD-10-CM | POA: Diagnosis not present

## 2016-03-02 DIAGNOSIS — R1084 Generalized abdominal pain: Secondary | ICD-10-CM | POA: Diagnosis present

## 2016-03-02 DIAGNOSIS — R110 Nausea: Secondary | ICD-10-CM | POA: Diagnosis not present

## 2016-03-02 DIAGNOSIS — Z7984 Long term (current) use of oral hypoglycemic drugs: Secondary | ICD-10-CM | POA: Diagnosis not present

## 2016-03-02 DIAGNOSIS — Z79899 Other long term (current) drug therapy: Secondary | ICD-10-CM | POA: Diagnosis not present

## 2016-03-02 DIAGNOSIS — E119 Type 2 diabetes mellitus without complications: Secondary | ICD-10-CM | POA: Diagnosis not present

## 2016-03-02 DIAGNOSIS — F17200 Nicotine dependence, unspecified, uncomplicated: Secondary | ICD-10-CM | POA: Diagnosis not present

## 2016-03-02 DIAGNOSIS — I10 Essential (primary) hypertension: Secondary | ICD-10-CM | POA: Diagnosis not present

## 2016-03-02 MED ORDER — ONDANSETRON HCL 4 MG/2ML IJ SOLN
4.0000 mg | Freq: Once | INTRAMUSCULAR | Status: AC
Start: 2016-03-02 — End: 2016-03-02
  Administered 2016-03-02: 4 mg via INTRAVENOUS
  Filled 2016-03-02: qty 2

## 2016-03-06 ENCOUNTER — Emergency Department (HOSPITAL_BASED_OUTPATIENT_CLINIC_OR_DEPARTMENT_OTHER)
Admission: EM | Admit: 2016-03-06 | Discharge: 2016-03-06 | Disposition: A | Discharge status: Home / Self Care | Payer: Medicare Other | Attending: Emergency Medicine | Admitting: Emergency Medicine

## 2016-03-06 ENCOUNTER — Emergency Department (HOSPITAL_BASED_OUTPATIENT_CLINIC_OR_DEPARTMENT_OTHER): Payer: Medicare Other

## 2016-03-06 ENCOUNTER — Encounter (HOSPITAL_BASED_OUTPATIENT_CLINIC_OR_DEPARTMENT_OTHER): Payer: Self-pay | Admitting: *Deleted

## 2016-03-06 DIAGNOSIS — A599 Trichomoniasis, unspecified: Secondary | ICD-10-CM

## 2016-03-06 DIAGNOSIS — F17210 Nicotine dependence, cigarettes, uncomplicated: Secondary | ICD-10-CM | POA: Insufficient documentation

## 2016-03-06 DIAGNOSIS — E119 Type 2 diabetes mellitus without complications: Secondary | ICD-10-CM | POA: Diagnosis not present

## 2016-03-06 DIAGNOSIS — I10 Essential (primary) hypertension: Secondary | ICD-10-CM | POA: Diagnosis not present

## 2016-03-06 DIAGNOSIS — Z79899 Other long term (current) drug therapy: Secondary | ICD-10-CM | POA: Insufficient documentation

## 2016-03-06 DIAGNOSIS — K5732 Diverticulitis of large intestine without perforation or abscess without bleeding: Secondary | ICD-10-CM | POA: Diagnosis not present

## 2016-03-06 DIAGNOSIS — B373 Candidiasis of vulva and vagina: Secondary | ICD-10-CM

## 2016-03-06 DIAGNOSIS — Z7984 Long term (current) use of oral hypoglycemic drugs: Secondary | ICD-10-CM | POA: Diagnosis not present

## 2016-03-06 DIAGNOSIS — R1031 Right lower quadrant pain: Secondary | ICD-10-CM | POA: Diagnosis present

## 2016-03-06 DIAGNOSIS — B3731 Acute candidiasis of vulva and vagina: Secondary | ICD-10-CM

## 2016-03-06 LAB — URINALYSIS, ROUTINE W REFLEX MICROSCOPIC
BILIRUBIN URINE: NEGATIVE
GLUCOSE, UA: NEGATIVE mg/dL
KETONES UR: NEGATIVE mg/dL
NITRITE: NEGATIVE
PH: 6.5 (ref 5.0–8.0)
PROTEIN: NEGATIVE mg/dL
Specific Gravity, Urine: 1.012 (ref 1.005–1.030)

## 2016-03-06 LAB — COMPREHENSIVE METABOLIC PANEL
ALBUMIN: 3.9 g/dL (ref 3.5–5.0)
ALK PHOS: 46 U/L (ref 38–126)
ALT: 26 U/L (ref 14–54)
ANION GAP: 8 (ref 5–15)
AST: 27 U/L (ref 15–41)
BILIRUBIN TOTAL: 0.7 mg/dL (ref 0.3–1.2)
BUN: 11 mg/dL (ref 6–20)
CALCIUM: 9.6 mg/dL (ref 8.9–10.3)
CO2: 26 mmol/L (ref 22–32)
Chloride: 104 mmol/L (ref 101–111)
Creatinine, Ser: 1.15 mg/dL — ABNORMAL HIGH (ref 0.44–1.00)
GFR calc Af Amer: 55 mL/min — ABNORMAL LOW (ref >60)
GFR, EST NON AFRICAN AMERICAN: 48 mL/min — AB (ref >60)
GLUCOSE: 110 mg/dL — AB (ref 65–99)
POTASSIUM: 3.3 mmol/L — AB (ref 3.5–5.1)
Sodium: 138 mmol/L (ref 135–145)
TOTAL PROTEIN: 7.8 g/dL (ref 6.5–8.1)

## 2016-03-06 LAB — CBC
HEMATOCRIT: 36 % (ref 36.0–46.0)
HEMOGLOBIN: 12.8 g/dL (ref 12.0–15.0)
MCH: 31.3 pg (ref 26.0–34.0)
MCHC: 35.6 g/dL (ref 30.0–36.0)
MCV: 88 fL (ref 78.0–100.0)
Platelets: 290 10*3/uL (ref 150–400)
RBC: 4.09 MIL/uL (ref 3.87–5.11)
RDW: 13.2 % (ref 11.5–15.5)
WBC: 7.2 10*3/uL (ref 4.0–10.5)

## 2016-03-06 LAB — WET PREP, GENITAL
CLUE CELLS WET PREP: NONE SEEN
Sperm: NONE SEEN

## 2016-03-06 LAB — URINE MICROSCOPIC-ADD ON

## 2016-03-06 LAB — LIPASE, BLOOD: LIPASE: 22 U/L (ref 11–51)

## 2016-03-06 MED ORDER — HYDROMORPHONE HCL 1 MG/ML IJ SOLN
1.0000 mg | Freq: Once | INTRAMUSCULAR | Status: AC
  Administered 2016-03-06: 1 mg via INTRAVENOUS
  Filled 2016-03-06: qty 1

## 2016-03-06 MED ORDER — OXYCODONE HCL 5 MG PO TABS: 5.0000 mg | ORAL_TABLET | Freq: Four times a day (QID) | ORAL | Status: DC | PRN

## 2016-03-06 MED ORDER — METRONIDAZOLE 250 MG PO TABS: 250.0000 mg | ORAL_TABLET | Freq: Three times a day (TID) | ORAL | Status: DC

## 2016-03-06 MED ORDER — MORPHINE SULFATE (PF) 4 MG/ML IV SOLN
4.0000 mg | Freq: Once | INTRAVENOUS | Status: DC
  Filled 2016-03-06: qty 1

## 2016-03-06 MED ORDER — AZITHROMYCIN 250 MG PO TABS
1000.0000 mg | ORAL_TABLET | Freq: Once | ORAL | Status: AC
  Administered 2016-03-06: 1000 mg via ORAL
  Filled 2016-03-06: qty 4

## 2016-03-06 MED ORDER — CEFTRIAXONE SODIUM 250 MG IJ SOLR
250.0000 mg | Freq: Once | INTRAMUSCULAR | Status: AC
  Administered 2016-03-06: 250 mg via INTRAMUSCULAR
  Filled 2016-03-06: qty 250

## 2016-03-06 MED ORDER — FLUCONAZOLE 150 MG PO TABS: 150.0000 mg | ORAL_TABLET | Freq: Once | ORAL | Status: AC

## 2016-03-06 MED ORDER — LIDOCAINE HCL (PF) 1 % IJ SOLN
INTRAMUSCULAR | Status: AC
  Filled 2016-03-06: qty 5

## 2016-03-06 MED ORDER — SODIUM CHLORIDE 0.9 % IV BOLUS (SEPSIS)
1000.0000 mL | Freq: Once | INTRAVENOUS | Status: AC
  Administered 2016-03-06: 1000 mL via INTRAVENOUS

## 2016-03-06 MED ORDER — IOPAMIDOL (ISOVUE-300) INJECTION 61%
100.0000 mL | Freq: Once | INTRAVENOUS | Status: AC | PRN
  Administered 2016-03-06: 100 mL via INTRAVENOUS

## 2016-03-06 MED FILL — metroNIDAZOLE 250 MG TABS: 250 | 7 days supply | Qty: 21 | Fill #0

## 2016-03-06 MED FILL — FLUCONAZOLE 150 MG TABLET: 150 | 1 days supply | Qty: 1 | Fill #0

## 2016-03-06 MED FILL — oxyCODONE HCL 5 MG TABS: 5 | 1 days supply | Qty: 5 | Fill #0

## 2016-03-06 NOTE — ED Provider Notes (Signed)
CSN: [id number]     Arrival date & time 03/06/16  1191 History   First MD Initiated Contact with Patient 03/06/16 [phone]     No chief complaint on file.  (Consider location/radiation/quality/duration/timing/severity/associated sxs/prior Treatment) HPI 70 y.o. female with a hx of HTN, DM, Hypercholesterolemia, presents to the Emergency Department today complaining of right lower quadrant pain since Friday. Pt was seen on 03-02-16 for same complaint in ED. At the time, she was having N/V/D and appetite change due to pain. A CT scan was performed and showed no acute abnormalities. Due to continued pain and inability to tolerate PO, she was admitted to [facility] for further diagnostics and supportive care. She was [facility] with Dx gastroenteritis. Pt states that she still had symptoms when she left the hospital. No N/V/D. No fevers. No CP/SOB. No headaches. Notes pain is 10/10 and a dull ache all over. No other symptoms noted.    Past Medical History  Diagnosis Date  . Hypertension   . Diabetes mellitus   . Hypercholesteremia    Past Surgical History  Procedure Laterality Date  . Tonsillectomy    . Joint replacement      hip replacement  . Tubal ligation    . Breast cyst excision    . Hemorrhoid surgery     No family history on file. Social History  Substance Use Topics  . Smoking status: Current Every Day Smoker -- 0.50 packs/day for 0 years    Types: Cigarettes  . Smokeless tobacco: None  . Alcohol Use: No   OB History    No data available     Review of Systems ROS reviewed and all are negative for acute change except as noted in the HPI.  Allergies  Aspirin; Codeine; and Penicillins  Home Medications   Prior to Admission medications   Medication Sig Start Date End Date Taking? Authorizing Provider  atorvastatin (LIPITOR) 20 MG tablet Take 20 mg by mouth daily.   Yes Historical Provider, MD  buPROPion (WELLBUTRIN SR) 150 MG 12 hr tablet Take 150 mg by mouth 2 (two) times  daily.   Yes Historical Provider, MD  hydrochlorothiazide (HYDRODIURIL) 50 MG tablet Take 50 mg by mouth daily.   Yes Historical Provider, MD  metFORMIN (GLUMETZA) 500 MG (MOD) 24 hr tablet Take 500 mg by mouth daily with breakfast.     Yes Historical Provider, MD  omeprazole (PRILOSEC) 20 MG capsule Take 20 mg by mouth daily.   Yes Historical Provider, MD  valsartan (DIOVAN) 80 MG tablet Take 80 mg by mouth daily.   Yes Historical Provider, MD   BP 158/98 mmHg  Pulse 84  Temp(Src) 98.4 F (36.9 C) (Oral)  Resp 18  Ht  (1.6 m)  Wt 78.019 kg  BMI 30.48 kg/m2  SpO2 98%   Physical Exam  Constitutional: She is oriented to person, place, and time. She appears well-developed and well-nourished.  HENT:  Head: Normocephalic and atraumatic.  Eyes: EOM are normal. Pupils are equal, round, and reactive to light.  Neck: Normal range of motion. Neck supple. No tracheal deviation present.  Cardiovascular: Normal rate, regular rhythm, normal heart sounds and intact distal pulses.   No murmur heard. Pulmonary/Chest: Effort normal and breath sounds normal. No respiratory distress. She has no wheezes. She has no rales. She exhibits no tenderness.  Abdominal: Soft. Normal appearance and bowel sounds are normal. There is generalized tenderness.  Diffusely tender throughout abdomen. Soft to palpation  Genitourinary: Pelvic exam was  performed with patient prone. Cervix exhibits no motion tenderness. Right adnexum displays no mass, no tenderness and no fullness. Left adnexum displays no mass, no tenderness and no fullness. No signs of injury around the vagina. Vaginal discharge found.  Chaperone present. White discharge noted from Cervix   Musculoskeletal: Normal range of motion.  Neurological: She is alert and oriented to person, place, and time.  Skin: Skin is warm and dry.  Psychiatric: She has a normal mood and affect. Her behavior is normal. Thought content normal.  Nursing note and vitals  reviewed.  ED Course  Procedures (including critical care time) Labs Review Labs Reviewed  COMPREHENSIVE METABOLIC PANEL - Abnormal; Notable for the following:    Potassium 3.3 (*)    Glucose, Bld 110 (*)    Creatinine, Ser 1.15 (*)    GFR calc non Af Amer 48 (*)    GFR calc Af Amer 55 (*)    All other components within normal limits  URINALYSIS, ROUTINE W REFLEX MICROSCOPIC (NOT AT [facility]) - Abnormal; Notable for the following:    APPearance CLOUDY (*)    Hgb urine dipstick MODERATE (*)    Leukocytes, UA LARGE (*)    All other components within normal limits  URINE MICROSCOPIC-ADD ON - Abnormal; Notable for the following:    Squamous Epithelial / LPF 6-30 (*)    Bacteria, UA MANY (*)    All other components within normal limits  URINE CULTURE  WET PREP, GENITAL  CBC  LIPASE, BLOOD  GC/CHLAMYDIA PROBE AMP ([place]) NOT AT [facility]   Imaging Review Ct Abdomen Pelvis W Contrast  03/06/2016  CLINICAL DATA:  Right-sided abdominal pain beginning 1 week ago. Etiology indeterminate. EXAM: CT ABDOMEN AND PELVIS WITH CONTRAST TECHNIQUE: Multidetector CT imaging of the abdomen and pelvis was performed using the standard protocol following bolus administration of intravenous contrast. CONTRAST:  100mL ISOVUE-300 IOPAMIDOL (ISOVUE-300) INJECTION 61% COMPARISON:  03/01/2016 FINDINGS: Lower chest:  Normal Hepatobiliary: Normal Pancreas: Normal Spleen: Normal Adrenals/Urinary Tract: Normal Stomach/Bowel: No small bowel abnormality. Extensive diverticulosis throughout the entire colon including the right colon. No visible inflammatory changes. Low level diverticulitis can be in apparent at imaging. The appendix is well seen and appears normal. Vascular/Lymphatic: No adenopathy. Aortic atherosclerosis. No aneurysm. Reproductive: Normal for age. Other: No free fluid or air. Musculoskeletal: Chronic lumbar degenerative changes. No acute finding. IMPRESSION: No change. No specific cause of the presenting  symptoms is identified. Advanced diverticulosis of the colon without imaging evidence of diverticulitis. Low level diverticulitis can be in apparent at imaging. Aortic atherosclerosis. Lumbar spine degenerative change. Electronically Signed   By: Paulina FusiMark  Shogry M.D.   On: 03/06/2016 12:02   I have personally reviewed and evaluated these images and lab results as part of my medical decision-making.   EKG Interpretation None      MDM  I have reviewed and evaluated the relevant laboratory values I have reviewed and evaluated the relevant imaging studies.  I have reviewed the relevant previous healthcare records. I obtained HPI from historian. Patient discussed with supervising physician  ED Course:  Assessment: Pt is a 70yF with hx HTN, DM, Hypercholesterolemia who presents with abdominal pain since Friday. Seen in ED for same. CT negative. Admitted to [facility] due to inability to tolerate PO. Supportive care initiated. DCed yesterday with Dx acute gastritis. On exam, pt in NAD. Nontoxic/nonseptic appearing. VSS. Afebrile. Lungs CTA. Heart RRR. Abdomen diffusely tender/soft. Labs with no leukocytosis. UA positive for Trichomoniasis. No vaginal bleeding/discharge. Pelvic  exam with white discharge. GC obtained. No CMT/Adnexal tenderness. CT ABD/Pelvis unremarkable. Possible diverticulitis unable to be captured on CT? Given analgesia and fluids in ED. Will treat for GC prophylactic. Wet prep shows yeast and Trich. Will treat for Trich and Yeast.  Plan is to DC home with follow up to PCP. At time of discharge, Patient is in no acute distress. Vital Signs are stable. Patient is able to ambulate. Patient able to tolerate PO.    Disposition/Plan:  DC Home Additional Verbal discharge instructions given and discussed with patient.  Pt Instructed to f/u with PCP in the next week for evaluation and treatment of symptoms. Return precautions given Pt acknowledges and agrees with plan  Supervising Physician Pricilla LovelessScott  Goldston, MD   Final diagnoses:  Trichimoniasis  Diverticulitis of large intestine without perforation or abscess without bleeding      Audry Piliyler Elenie Coven, PA-C 03/06/16 1310  Pricilla LovelessScott Goldston, MD 03/07/16 [phone]

## 2016-03-06 NOTE — ED Notes (Addendum)
  Pt family came to desk to ask for socks for pt and coffee for herself, given.

## 2016-03-06 NOTE — Discharge Instructions (Signed)
Please read and follow all provided instructions.  Your diagnoses today include:  1. Trichimoniasis   2. Vulvovaginal candidiasis    Tests performed today include:  Vital signs. See below for your results today.   Medications prescribed:   Take as prescribed. Take the Diflucan AFTER you take the full course of Flagyl.  Home care instructions:  Follow any educational materials contained in this packet.  Follow-up instructions: Please follow-up with your primary care provider for further evaluation of symptoms and treatment   Return instructions:   Please return to the Emergency Department if you do not get better, if you get worse, or new symptoms OR  - Fever (temperature greater than 101.3F)  - Bleeding that does not stop with holding pressure to the area    -Severe pain (please note that you may be more sore the day after your accident)  - Chest Pain  - Difficulty breathing  - Severe nausea or vomiting  - Inability to tolerate food and liquids  - Passing out  - Skin becoming red around your wounds  - Change in mental status (confusion or lethargy)  - New numbness or weakness     Please return if you have any other emergent concerns.  Additional Information:  Your vital signs today were: BP 165/87 mmHg   Pulse 69   Temp(Src) 98.4 F (36.9 C) (Oral)   Resp 18   Ht 5\' 3"  (1.6 m)   Wt 78.019 kg   BMI 30.48 kg/m2   SpO2 97% If your blood pressure (BP) was elevated above 135/85 this visit, please have this repeated by your doctor within one month. ---------------

## 2016-03-06 NOTE — ED Notes (Signed)
PT back in room. Asked pt if she was allergic to Morphine and she states no " it doesn't do anything for me, I have to have delala." Asked if morphine made her have n/v. Pt states "no". PA aware.

## 2016-03-06 NOTE — ED Notes (Signed)
PT in BR, family in room states pt cannot take morphine it makes her have n/v. Talked with daughter on phone and daughter confirms this. PA aware.

## 2016-03-06 NOTE — ED Notes (Signed)
Pt has had abd pain since Friday and [facility] with [facility] and stomach viris and was discharged from hospital yesterday. C/o today of lower right abd pain with no other sx.

## 2016-03-07 LAB — URINE CULTURE: Culture: 10000 — AB

## 2016-03-08 ENCOUNTER — Emergency Department (HOSPITAL_COMMUNITY)
Admission: EM | Admit: 2016-03-08 | Discharge: 2016-03-08 | Disposition: A | Discharge status: Home / Self Care | Payer: Medicare Other | Attending: Emergency Medicine | Admitting: Emergency Medicine

## 2016-03-08 ENCOUNTER — Telehealth (HOSPITAL_BASED_OUTPATIENT_CLINIC_OR_DEPARTMENT_OTHER): Payer: Self-pay

## 2016-03-08 ENCOUNTER — Encounter (HOSPITAL_COMMUNITY): Payer: Self-pay

## 2016-03-08 ENCOUNTER — Emergency Department (HOSPITAL_COMMUNITY): Payer: Medicare Other

## 2016-03-08 DIAGNOSIS — Z7984 Long term (current) use of oral hypoglycemic drugs: Secondary | ICD-10-CM | POA: Diagnosis not present

## 2016-03-08 DIAGNOSIS — F17210 Nicotine dependence, cigarettes, uncomplicated: Secondary | ICD-10-CM | POA: Insufficient documentation

## 2016-03-08 DIAGNOSIS — R1031 Right lower quadrant pain: Secondary | ICD-10-CM | POA: Diagnosis not present

## 2016-03-08 DIAGNOSIS — Z96649 Presence of unspecified artificial hip joint: Secondary | ICD-10-CM | POA: Diagnosis not present

## 2016-03-08 DIAGNOSIS — E119 Type 2 diabetes mellitus without complications: Secondary | ICD-10-CM | POA: Diagnosis not present

## 2016-03-08 DIAGNOSIS — I10 Essential (primary) hypertension: Secondary | ICD-10-CM | POA: Diagnosis not present

## 2016-03-08 DIAGNOSIS — E876 Hypokalemia: Secondary | ICD-10-CM

## 2016-03-08 DIAGNOSIS — R7989 Other specified abnormal findings of blood chemistry: Secondary | ICD-10-CM

## 2016-03-08 DIAGNOSIS — Z79899 Other long term (current) drug therapy: Secondary | ICD-10-CM | POA: Insufficient documentation

## 2016-03-08 LAB — URINE MICROSCOPIC-ADD ON

## 2016-03-08 LAB — COMPREHENSIVE METABOLIC PANEL
ALBUMIN: 4.1 g/dL (ref 3.5–5.0)
ALT: 25 U/L (ref 14–54)
AST: 22 U/L (ref 15–41)
Alkaline Phosphatase: 44 U/L (ref 38–126)
Anion gap: 8 (ref 5–15)
BUN: 9 mg/dL (ref 6–20)
CHLORIDE: 103 mmol/L (ref 101–111)
CO2: 26 mmol/L (ref 22–32)
CREATININE: 1.24 mg/dL — AB (ref 0.44–1.00)
Calcium: 10 mg/dL (ref 8.9–10.3)
GFR calc Af Amer: 51 mL/min — ABNORMAL LOW (ref >60)
GFR calc non Af Amer: 44 mL/min — ABNORMAL LOW (ref >60)
GLUCOSE: 81 mg/dL (ref 65–99)
POTASSIUM: 3.1 mmol/L — AB (ref 3.5–5.1)
Sodium: 137 mmol/L (ref 135–145)
Total Bilirubin: 0.5 mg/dL (ref 0.3–1.2)
Total Protein: 8.2 g/dL — ABNORMAL HIGH (ref 6.5–8.1)

## 2016-03-08 LAB — URINALYSIS, ROUTINE W REFLEX MICROSCOPIC
BILIRUBIN URINE: NEGATIVE
GLUCOSE, UA: NEGATIVE mg/dL
KETONES UR: NEGATIVE mg/dL
Leukocytes, UA: NEGATIVE
Nitrite: NEGATIVE
PH: 7 (ref 5.0–8.0)
PROTEIN: NEGATIVE mg/dL
Specific Gravity, Urine: 1.013 (ref 1.005–1.030)

## 2016-03-08 LAB — CBC
HEMATOCRIT: 40.9 % (ref 36.0–46.0)
Hemoglobin: 13.9 g/dL (ref 12.0–15.0)
MCH: 30.7 pg (ref 26.0–34.0)
MCHC: 34 g/dL (ref 30.0–36.0)
MCV: 90.3 fL (ref 78.0–100.0)
PLATELETS: 337 10*3/uL (ref 150–400)
RBC: 4.53 MIL/uL (ref 3.87–5.11)
RDW: 13.6 % (ref 11.5–15.5)
WBC: 6.6 10*3/uL (ref 4.0–10.5)

## 2016-03-08 LAB — LIPASE, BLOOD: LIPASE: 38 U/L (ref 11–51)

## 2016-03-08 MED ORDER — SODIUM CHLORIDE 0.9 % IV BOLUS (SEPSIS)
1000.0000 mL | Freq: Once | INTRAVENOUS | Status: AC
  Administered 2016-03-08: 1000 mL via INTRAVENOUS

## 2016-03-08 MED ORDER — HYDROMORPHONE HCL 1 MG/ML IJ SOLN
0.5000 mg | Freq: Once | INTRAMUSCULAR | Status: AC
  Administered 2016-03-08: 0.5 mg via INTRAVENOUS
  Filled 2016-03-08: qty 1

## 2016-03-08 MED ORDER — POTASSIUM CHLORIDE CRYS ER 20 MEQ PO TBCR
40.0000 meq | EXTENDED_RELEASE_TABLET | Freq: Once | ORAL | Status: AC
  Administered 2016-03-08: 40 meq via ORAL
  Filled 2016-03-08: qty 2

## 2016-03-08 MED ORDER — POTASSIUM CHLORIDE CRYS ER 20 MEQ PO TBCR: 20.0000 meq | EXTENDED_RELEASE_TABLET | Freq: Every day | ORAL | Status: DC

## 2016-03-08 MED ORDER — OXYCODONE HCL 5 MG PO TABS: 5.0000 mg | ORAL_TABLET | Freq: Three times a day (TID) | ORAL | Status: DC | PRN

## 2016-03-08 NOTE — ED Notes (Signed)
PA hannah at bedside.

## 2016-03-08 NOTE — Discharge Instructions (Signed)
1. Medications: oxycodone, usual home medications 2. Treatment: rest, drink plenty of fluids, advance diet slowly; discussed concurrent use of stool softener or MiraLAX for constipation with narcotics 3. Follow Up: Please followup with your primary doctor in 2 days and gastroenterology as soon as possible for discussion of your diagnoses and further evaluation after today's visit; if you do not have a primary care doctor use the resource guide provided to find one; Please return to the ER for persistent vomiting, high fevers or worsening symptoms

## 2016-03-08 NOTE — Telephone Encounter (Signed)
Post ED Visit - Positive Culture Follow-up  Culture report reviewed by antimicrobial stewardship pharmacist:  []  Alexandria Durham, Pharm.D. []  Alexandria Durham, Pharm.D., BCPS []  Alexandria Durham, Pharm.D. []  Alexandria Durham, Pharm.D., BCPS []  Alexandria Durham, [place]D., BCPS, AAHIVP []  Alexandria Durham, Pharm.D., BCPS, AAHIVP []  Alexandria Durham, Pharm.D. []  Alexandria Durham, [place]arm.Novella Rob. Jennifer Marple Pharm D Positive urine culture Treated with Metronidazole and fluconazole, organism sensitive to the same and no further patient follow-up is required at this time.  Alexandria Durham, Alexandria Durham 03/08/2016, 11:05 AM

## 2016-03-08 NOTE — ED Provider Notes (Signed)
Complains of right-sided abdominal pain onset 10 days ago, constant. Not made with better or worse by anything. Last bowel movement yesterday, normal. No fever. Patient reports that she's been told she has diverticulitis however has had CT scans of the abdomen and pelvis on 03/01/2016 and 03/06/2016 showing diverticulosis without evidence of diverticulitis. Her pain is mild at present. She required oxycodone yesterday afternoon and took Tylenol PM last night has had no analgesics today. She is presently hungry. On exam alert and in no distress lungs clear auscultation heart regular rate and rhythm abdomen nondistended normal bowel sounds mild tenderness at right upper quadrant and right lower quadrants no guarding rigidity or rebound  Doug SouSam Margerite Impastato, MD 03/08/16 1802

## 2016-03-08 NOTE — ED Notes (Addendum)
Patient here with 10 days of ongoing abdominal pain. [facility] and then evaluated again at urgent care and taking meds for diverticulitis. Patient states that her pain is now more on right side with soreness across middle abdomen.Marland Kitchen. Describes the pain as dull, no further vomiting or diarrhea, here for the persistent pain.

## 2016-03-08 NOTE — ED Provider Notes (Signed)
CSN: [id number]     Arrival date & time 03/08/16  1604 History   First MD Initiated Contact with Patient 03/08/16 1645     Chief Complaint  Patient presents with  . Abdominal Pain     (Consider location/radiation/quality/duration/timing/severity/associated sxs/prior Treatment) The history is provided by the patient, medical records and a relative. No language interpreter was used.     Alexandria Durham is a 70 y.o. female  with a hx of HTN, diabetes, high cholesterol presents to the Emergency Department complaining of Persistent but somewhat improved right lower quadrant and epigastric abdominal pain. Patient reports that pain is constant. She has been taking oxycodone and Tylenol No. 3 with improvement. She reports that at the onset of this she had nausea and vomiting and was hospitalized but that has resolved.  She states she is currently hungry.  No aggravating or alleviating factors. Patient reports that pain does not increase after she eats.  She denies fever, chills, headache, neck pain, chest pain, shortness of breath, syncope, dysuria, hematuria, leg pain, back pain.  Family bedside reports frustration the patient continues to have pain and no answer has been obtained.  Record review shows that during patient's visit on 7/7 she received a CT scan which had no evidence of appendicitis or diverticulitis. She also received a pelvic exam for which numerous white blood cells and trichomonas were found along with white discharge. Patient was treated in the emergency department with azithromycin and Rocephin. She was discharged home with Flagyl and Diflucan. Her urinalysis cultured out yeast.  I do not see the results of the gonorrhea or chlamydia culture.  Patient also had CT scan on 7/2 which was also reassuring.   Past Medical History  Diagnosis Date  . Hypertension   . Diabetes mellitus   . Hypercholesteremia    Past Surgical History  Procedure Laterality Date  . Tonsillectomy    .  Joint replacement      hip replacement  . Tubal ligation    . Breast cyst excision    . Hemorrhoid surgery     No family history on file. Social History  Substance Use Topics  . Smoking status: Current Every Day Smoker -- 0.50 packs/day for 0 years    Types: Cigarettes  . Smokeless tobacco: None  . Alcohol Use: No   OB History    No data available     Review of Systems  Constitutional: Negative for fever, diaphoresis, appetite change, fatigue and unexpected weight change.  HENT: Negative for mouth sores.   Eyes: Negative for visual disturbance.  Respiratory: Negative for cough, chest tightness, shortness of breath and wheezing.   Cardiovascular: Negative for chest pain.  Gastrointestinal: Positive for abdominal pain. Negative for nausea, vomiting, diarrhea and constipation.  Endocrine: Negative for polydipsia, polyphagia and polyuria.  Genitourinary: Negative for dysuria, urgency, frequency and hematuria.  Musculoskeletal: Negative for back pain and neck stiffness.  Skin: Negative for rash.  Allergic/Immunologic: Negative for immunocompromised state.  Neurological: Negative for syncope, light-headedness and headaches.  Hematological: Does not bruise/bleed easily.  Psychiatric/Behavioral: Negative for sleep disturbance. The patient is not nervous/anxious.       Allergies  Ace inhibitors; Aspirin; Codeine; and Penicillins  Home Medications   Prior to Admission medications   Medication Sig Start Date End Date Taking? Authorizing Provider  acetaminophen (TYLENOL) 500 MG tablet Take 1,000 mg by mouth every 6 (six) hours as needed for moderate pain.   Yes Historical Provider, MD  atorvastatin (LIPITOR) 20  MG tablet Take 20 mg by mouth daily.   Yes Historical Provider, MD  buPROPion (WELLBUTRIN XL) 150 MG 24 hr tablet Take 150 mg by mouth every evening.   Yes Historical Provider, MD  diphenhydramine-acetaminophen (TYLENOL PM) 25-500 MG TABS tablet Take 2 tablets by mouth at  bedtime as needed.   Yes Historical Provider, MD  ergocalciferol (VITAMIN D2) 50000 units capsule Take 50,000 Units by mouth every Monday.   Yes Historical Provider, MD  hydrochlorothiazide (HYDRODIURIL) 50 MG tablet Take 50 mg by mouth daily.   Yes Historical Provider, MD  metFORMIN (GLUMETZA) 500 MG (MOD) 24 hr tablet Take 500 mg by mouth every evening.    Yes Historical Provider, MD  metroNIDAZOLE (FLAGYL) 250 MG tablet Take 1 tablet (250 mg total) by mouth 3 (three) times daily. 03/06/16  Yes Audry Piliyler Mohr, PA-C  omeprazole (PRILOSEC) 20 MG capsule Take 20 mg by mouth daily.   Yes Historical Provider, MD  valsartan (DIOVAN) 80 MG tablet Take 80 mg by mouth daily.   Yes Historical Provider, MD  fluconazole (DIFLUCAN) 150 MG tablet Take 1 tablet (150 mg total) by mouth once. 03/06/16 03/13/16  Audry Piliyler Mohr, PA-C  oxyCODONE (ROXICODONE) 5 MG immediate release tablet Take 1 tablet (5 mg total) by mouth every 8 (eight) hours as needed for severe pain. 03/08/16   Kambre Messner, PA-C  potassium chloride SA (K-DUR,KLOR-CON) 20 MEQ tablet Take 1 tablet (20 mEq total) by mouth daily. 03/08/16   Yasemin Rabon, PA-C   BP 157/69 mmHg  Pulse 80  Temp(Src) 98.5 F (36.9 C) (Oral)  Resp 16  Ht 5\' 4"  (1.626 m)  Wt 78.019 kg  BMI 29.51 kg/m2  SpO2 99% Physical Exam  Constitutional: She appears well-developed and well-nourished. No distress.  Awake, alert, nontoxic appearance  HENT:  Head: Normocephalic and atraumatic.  Mouth/Throat: Oropharynx is clear and moist. No oropharyngeal exudate.  Eyes: Conjunctivae are normal. No scleral icterus.  Neck: Normal range of motion. Neck supple.  Cardiovascular: Normal rate, regular rhythm and intact distal pulses.   Pulmonary/Chest: Effort normal and breath sounds normal. No respiratory distress. She has no wheezes.  Equal chest expansion  Abdominal: Soft. Bowel sounds are normal. She exhibits no distension and no mass. There is tenderness in the right upper  quadrant, right lower quadrant and epigastric area. There is guarding ( mild). There is no rebound.  Musculoskeletal: Normal range of motion. She exhibits no edema.  Neurological: She is alert.  Speech is clear and goal oriented Moves extremities without ataxia  Skin: Skin is warm and dry. She is not diaphoretic.  Psychiatric: She has a normal mood and affect.  Nursing note and vitals reviewed.   ED Course  Procedures (including critical care time) Labs Review Labs Reviewed  COMPREHENSIVE METABOLIC PANEL - Abnormal; Notable for the following:    Potassium 3.1 (*)    Creatinine, Ser 1.24 (*)    Total Protein 8.2 (*)    GFR calc non Af Amer 44 (*)    GFR calc Af Amer 51 (*)    All other components within normal limits  URINALYSIS, ROUTINE W REFLEX MICROSCOPIC (NOT AT [facility]) - Abnormal; Notable for the following:    Hgb urine dipstick SMALL (*)    All other components within normal limits  URINE MICROSCOPIC-ADD ON - Abnormal; Notable for the following:    Squamous Epithelial / LPF 0-5 (*)    Bacteria, UA RARE (*)    All other components within normal limits  LIPASE, BLOOD  CBC    Imaging Review Dg Abd Acute W/chest  03/08/2016  CLINICAL DATA:  Acute onset of right upper quadrant abdominal pain. Initial encounter. EXAM: DG ABDOMEN ACUTE W/ 1V CHEST COMPARISON:  Chest radiograph performed 03/31/2011, and CT of the abdomen and pelvis from 03/06/2016 FINDINGS: The lungs are well-aerated and clear. There is no evidence of focal opacification, pleural effusion or pneumothorax. The cardiomediastinal silhouette is within normal limits. The visualized bowel gas pattern is unremarkable. Scattered contrast, stool and air are seen within the colon; there is no evidence of small bowel dilatation to suggest obstruction. Diffuse diverticulosis is noted along the colon. No free intra-abdominal air is identified on the provided upright view. No acute osseous abnormalities are seen; the sacroiliac joints  are unremarkable in appearance. A right hip arthroplasty is grossly unremarkable in appearance, though incompletely imaged. IMPRESSION: 1. Diffuse diverticulosis noted about the colon, with contrast filled diverticula. Unremarkable bowel gas pattern; no free intra-abdominal air seen. 2. No acute cardiopulmonary process seen. Electronically Signed   By: Roanna Raider M.D.   On: 03/08/2016 18:35   I have personally reviewed and evaluated these images and lab results as part of my medical decision-making.    MDM   Final diagnoses:  Right lower quadrant abdominal pain  Elevated serum creatinine  Hypokalemia   Alexandria Durham presents with Persistent abdominal pain 10 days. Patient appears well. No clinical signs of dehydration. She's had multiple CT scans in the last week all without acute abnormalities.  At this time I do not want to repeat a CT scan as her clinical exam is not concerning for appendicitis, cholecystitis or intra-abdominal infection. Elevated serum creatinine likely secondary to multiple CT scans. Urinalysis without evidence of urinary tract infection. No leukocytosis. Patient is afebrile. No evidence of sepsis.   8:08 PM Patient reports she is feeling much better. Her abdominal pain has resolved. She is eating and drinking without difficulty. Potassium 3.1 and repleted in the emergency department. She will be discharged home with several days.  Acute abdominal films are without perforation or other acute abnormalities.  Discussed plan with patient and family.  They are comfortable with discharge home, gastro-neurology follow-up and return as needed. They are in agreement that this is the best option for the patient at this time.  The patient was discussed with and seen by Dr. Ethelda Chick who agrees with the treatment plan.  BP 146/76 mmHg  Pulse 73  Temp(Src) 98.5 F (36.9 C) (Oral)  Resp 16  Ht  (1.626 m)  Wt 78.019 kg  BMI 29.51 kg/m2  SpO2 100%    Dierdre Forth, PA-C 03/09/16 0056  Doug Sou, MD 03/09/16 [phone]

## 2016-03-09 LAB — GC/CHLAMYDIA PROBE AMP (~~LOC~~) NOT AT ARMC
Chlamydia: NEGATIVE
Neisseria Gonorrhea: NEGATIVE

## 2016-04-02 ENCOUNTER — Telehealth (HOSPITAL_BASED_OUTPATIENT_CLINIC_OR_DEPARTMENT_OTHER): Payer: Self-pay | Admitting: Emergency Medicine

## 2016-04-30 DIAGNOSIS — I517 Cardiomegaly: Secondary | ICD-10-CM | POA: Insufficient documentation

## 2016-10-16 ENCOUNTER — Emergency Department (HOSPITAL_BASED_OUTPATIENT_CLINIC_OR_DEPARTMENT_OTHER)
Admission: EM | Admit: 2016-10-16 | Discharge: 2016-10-16 | Disposition: A | Discharge status: Home / Self Care | Payer: Medicare Other | Attending: Emergency Medicine | Admitting: Emergency Medicine

## 2016-10-16 ENCOUNTER — Encounter (HOSPITAL_BASED_OUTPATIENT_CLINIC_OR_DEPARTMENT_OTHER): Payer: Self-pay

## 2016-10-16 ENCOUNTER — Emergency Department (HOSPITAL_BASED_OUTPATIENT_CLINIC_OR_DEPARTMENT_OTHER): Payer: Medicare Other

## 2016-10-16 DIAGNOSIS — Z7984 Long term (current) use of oral hypoglycemic drugs: Secondary | ICD-10-CM | POA: Insufficient documentation

## 2016-10-16 DIAGNOSIS — Z79899 Other long term (current) drug therapy: Secondary | ICD-10-CM | POA: Diagnosis not present

## 2016-10-16 DIAGNOSIS — I10 Essential (primary) hypertension: Secondary | ICD-10-CM | POA: Insufficient documentation

## 2016-10-16 DIAGNOSIS — E119 Type 2 diabetes mellitus without complications: Secondary | ICD-10-CM | POA: Diagnosis not present

## 2016-10-16 DIAGNOSIS — R05 Cough: Secondary | ICD-10-CM | POA: Diagnosis present

## 2016-10-16 DIAGNOSIS — J329 Chronic sinusitis, unspecified: Secondary | ICD-10-CM | POA: Diagnosis not present

## 2016-10-16 DIAGNOSIS — F17210 Nicotine dependence, cigarettes, uncomplicated: Secondary | ICD-10-CM | POA: Insufficient documentation

## 2016-10-16 LAB — CBG MONITORING, ED: GLUCOSE-CAPILLARY: 117 mg/dL — AB (ref 65–99)

## 2016-10-16 MED ORDER — FLUTICASONE PROPIONATE 50 MCG/ACT NA SUSP: 2.0000 | Freq: Every day | NASAL | 2 refills | Status: DC

## 2016-10-16 MED ORDER — DOXYCYCLINE HYCLATE 100 MG PO CAPS: 100.0000 mg | ORAL_CAPSULE | Freq: Two times a day (BID) | ORAL | 0 refills | Status: DC

## 2016-10-16 MED FILL — FLUTICASONE PROP 50 MCG SPR: 50 | 30 days supply | Qty: 16 | Fill #0

## 2016-10-16 MED FILL — DOXYCYCLINE HYC 100 MG CAP: 100 | 7 days supply | Qty: 14 | Fill #0

## 2016-10-16 NOTE — ED Provider Notes (Signed)
Complains of cough and nasal congestion and a pressure sensation in her head for the past 5 weeks. No fever. No other associated symptoms. Patient continues to smoke. On exam alert and in no distress. HEENT exam nasal congestion oropharynx normal bilateral tympanic membranes normal lungs clear to auscultation. In light of symptoms lasting 5 weeks, patient diabetic, we'll treat with antibiotic. I counseled patient for 5 minutes on smoking cessation chest x-ray viewed by me   Doug SouSam Morgen Linebaugh, MD 10/16/16 1313

## 2016-10-16 NOTE — Discharge Instructions (Addendum)
You may have a combination of inflammation in the nasal passages and a sinus infection. Please take all of your antibiotics until finished!   You may develop abdominal discomfort or diarrhea from the antibiotic.  You may help offset this with probiotics which you can buy or get in yogurt. Do not eat or take the probiotics until 2 hours after your antibiotic.    Be sure to stay well hydrated. Drink enough water and electrolyte drinks, such as Gatorade, to assure your urine is light yellow or almost clear.   Please follow up with your primary care provider as planned next week. Please also speak with your PCP on smoking cessation assistance as well.

## 2016-10-16 NOTE — ED Triage Notes (Signed)
C/o flu like sx x 5 weeks-NAD-steady gait

## 2016-10-16 NOTE — ED Provider Notes (Signed)
[facility]   CSN: [id number] Arrival date & time: 10/16/16  1057     History   Chief Complaint Chief Complaint  Patient presents with  . Cough    HPI Alexandria Durham is a 71 y.o. female.  HPI    Alexandria Durham is a 71 y.o. female, with a history of HTN and DM, presenting to the ED with nonproductive cough, nasal congestion, rhinorrhea, sneezing, and "ears stopped up," intermittently for the last 5 weeks. She has tried Tylenol cold and sinus intermittently without relief. She has also tried saline nasal spray. Pt has not seen her PCP for this issue, but has an appointment scheduled for Feb 21. Denies fever/chills, N/V/D, SOB, CP, or any other complaints.      Past Medical History:  Diagnosis Date  . Diabetes mellitus   . Hypercholesteremia   . Hypertension     There are no active problems to display for this patient.   Past Surgical History:  Procedure Laterality Date  . BREAST CYST EXCISION    . HEMORRHOID SURGERY    . HIP SURGERY    . JOINT REPLACEMENT     hip replacement  . TONSILLECTOMY    . TUBAL LIGATION      OB History    No data available       Home Medications    Prior to Admission medications   Medication Sig Start Date End Date Taking? Authorizing Provider  acetaminophen (TYLENOL) 500 MG tablet Take 1,000 mg by mouth every 6 (six) hours as needed for moderate pain.    Historical Provider, MD  atorvastatin (LIPITOR) 20 MG tablet Take 20 mg by mouth daily.    Historical Provider, MD  buPROPion (WELLBUTRIN XL) 150 MG 24 hr tablet Take 150 mg by mouth every evening.    Historical Provider, MD  diphenhydramine-acetaminophen (TYLENOL PM) 25-500 MG TABS tablet Take 2 tablets by mouth at bedtime as needed.    Historical Provider, MD  doxycycline (VIBRAMYCIN) 100 MG capsule Take 1 capsule (100 mg total) by mouth 2 (two) times daily. 10/16/16   Uilani Sanville C Karon Heckendorn, PA-C  ergocalciferol (VITAMIN D2) 50000 units capsule Take 50,000 Units  by mouth every Monday.    Historical Provider, MD  fluticasone (FLONASE) 50 MCG/ACT nasal spray Place 2 sprays into both nostrils daily. 10/16/16   Eduarda Scrivens C Jaekwon Mcclune, PA-C  hydrochlorothiazide (HYDRODIURIL) 50 MG tablet Take 50 mg by mouth daily.    Historical Provider, MD  metFORMIN (GLUMETZA) 500 MG (MOD) 24 hr tablet Take 500 mg by mouth every evening.     Historical Provider, MD  omeprazole (PRILOSEC) 20 MG capsule Take 20 mg by mouth daily.    Historical Provider, MD  valsartan (DIOVAN) 80 MG tablet Take 80 mg by mouth daily.    Historical Provider, MD    Family History No family history on file.  Social History Social History  Substance Use Topics  . Smoking status: Current Every Day Smoker    Packs/day: 0.50    Years: 0.00    Types: Cigarettes  . Smokeless tobacco: Never Used  . Alcohol use No     Allergies   Ace inhibitors; Aspirin; Codeine; and Penicillins   Review of Systems Review of Systems  Constitutional: Negative for chills and fever.  HENT: Positive for congestion, rhinorrhea, sinus pressure and sneezing. Negative for trouble swallowing.   Respiratory: Positive for cough. Negative for shortness of breath.   Cardiovascular: Negative for chest pain.  Gastrointestinal: Negative for abdominal pain, diarrhea, nausea and vomiting.  Neurological: Negative for dizziness, light-headedness and headaches.  All other systems reviewed and are negative.    Physical Exam Updated Vital Signs BP 126/66 (BP Location: Left Arm)   Pulse 84   Temp 98.5 F (36.9 C) (Oral)   Resp 18   Ht 5\' 5"  (1.651 m)   Wt 78.5 kg   SpO2 100%   BMI 28.79 kg/m   Physical Exam  Constitutional: She appears well-developed and well-nourished. No distress.  HENT:  Head: Normocephalic and atraumatic.  Nose: Mucosal edema and rhinorrhea present.  Mouth/Throat: Oropharynx is clear and moist.  Tenderness over the frontal sinuses.   Eyes: Conjunctivae and EOM are normal. Pupils are equal, round,  and reactive to light.  Neck: Normal range of motion. Neck supple.  Cardiovascular: Normal rate, regular rhythm, normal heart sounds and intact distal pulses.   Pulmonary/Chest: Effort normal and breath sounds normal. No respiratory distress.  Abdominal: Soft. There is no tenderness. There is no guarding.  Musculoskeletal: She exhibits no edema.  Lymphadenopathy:    She has no cervical adenopathy.  Neurological: She is alert.  Skin: Skin is warm and dry. She is not diaphoretic.  Psychiatric: She has a normal mood and affect. Her behavior is normal.  Nursing note and vitals reviewed.    ED Treatments / Results  Labs (all labs ordered are listed, but only abnormal results are displayed) Labs Reviewed  CBG MONITORING, ED - Abnormal; Notable for the following:       Result Value   Glucose-Capillary 117 (*)    All other components within normal limits    EKG  EKG Interpretation None       Radiology Dg Chest 2 View  Result Date: 10/16/2016 CLINICAL DATA:  Productive cough, smoker EXAM: CHEST  2 VIEW COMPARISON:  03/08/2016 FINDINGS: Cardiomediastinal silhouette is unremarkable. No infiltrate or pleural effusion. No pulmonary edema. Mild infrahilar bronchitic changes. Mild degenerative changes mid thoracic spine. IMPRESSION: No infiltrate or pulmonary edema. Mild infrahilar bronchitic changes. Electronically Signed   By: Natasha Mead M.D.   On: 10/16/2016 11:33    Procedures Procedures (including critical care time)  Medications Ordered in ED Medications - No data to display   Initial Impression / Assessment and Plan / ED Course  I have reviewed the triage vital signs and the nursing notes.  Pertinent labs & imaging results that were available during my care of the patient were reviewed by me and considered in my medical decision making (see chart for details).     Patient presents with nasal and sinus congestion as well as a nonproductive cough for the last 5 weeks.  Symptoms consistent with possible rhinosinusitis. Patient has close PCP follow-up. Further home care and return precautions discussed. Patient voices understanding of all instructions and is comfortable with discharge.  Findings and plan of care discussed with Doug Sou, MD. Dr. Ethelda Chick personally evaluated and examined this patient.   Final Clinical Impressions(s) / ED Diagnoses   Final diagnoses:  Rhinosinusitis    New Prescriptions Discharge Medication List as of 10/16/2016  1:15 PM    START taking these medications   Details  doxycycline (VIBRAMYCIN) 100 MG capsule Take 1 capsule (100 mg total) by mouth 2 (two) times daily., Starting Fri 10/16/2016, Print    fluticasone (FLONASE) 50 MCG/ACT nasal spray Place 2 sprays into both nostrils daily., Starting Fri 10/16/2016, Print         Anselm Pancoast,  PA-C 10/16/16 1644    Doug Sou, MD 10/16/16 1651

## 2016-11-11 ENCOUNTER — Encounter (HOSPITAL_BASED_OUTPATIENT_CLINIC_OR_DEPARTMENT_OTHER): Payer: Self-pay | Admitting: Emergency Medicine

## 2016-11-11 ENCOUNTER — Emergency Department (HOSPITAL_BASED_OUTPATIENT_CLINIC_OR_DEPARTMENT_OTHER): Payer: Medicare Other

## 2016-11-11 ENCOUNTER — Emergency Department (HOSPITAL_BASED_OUTPATIENT_CLINIC_OR_DEPARTMENT_OTHER)
Admission: EM | Admit: 2016-11-11 | Discharge: 2016-11-11 | Disposition: A | Discharge status: Home / Self Care | Payer: Medicare Other | Attending: Emergency Medicine | Admitting: Emergency Medicine

## 2016-11-11 DIAGNOSIS — M25562 Pain in left knee: Secondary | ICD-10-CM | POA: Diagnosis present

## 2016-11-11 DIAGNOSIS — M5416 Radiculopathy, lumbar region: Secondary | ICD-10-CM | POA: Diagnosis not present

## 2016-11-11 DIAGNOSIS — F17210 Nicotine dependence, cigarettes, uncomplicated: Secondary | ICD-10-CM | POA: Insufficient documentation

## 2016-11-11 DIAGNOSIS — I10 Essential (primary) hypertension: Secondary | ICD-10-CM | POA: Diagnosis not present

## 2016-11-11 DIAGNOSIS — E119 Type 2 diabetes mellitus without complications: Secondary | ICD-10-CM | POA: Diagnosis not present

## 2016-11-11 DIAGNOSIS — Z79899 Other long term (current) drug therapy: Secondary | ICD-10-CM | POA: Diagnosis not present

## 2016-11-11 HISTORY — DX: Unspecified osteoarthritis, unspecified site: M19.90

## 2016-11-11 MED ORDER — CYCLOBENZAPRINE HCL 10 MG PO TABS: 10.0000 mg | ORAL_TABLET | Freq: Three times a day (TID) | ORAL | 0 refills | Status: DC | PRN

## 2016-11-11 MED ORDER — METHOCARBAMOL 500 MG PO TABS: 500.0000 mg | ORAL_TABLET | Freq: Two times a day (BID) | ORAL | 0 refills | Status: DC

## 2016-11-11 MED ORDER — TRAMADOL HCL 50 MG PO TABS
50.0000 mg | ORAL_TABLET | Freq: Once | ORAL | Status: AC
  Administered 2016-11-11: 50 mg via ORAL
  Filled 2016-11-11: qty 1

## 2016-11-11 MED ORDER — TRAMADOL HCL 50 MG PO TABS: 50.0000 mg | ORAL_TABLET | Freq: Four times a day (QID) | ORAL | 0 refills | Status: DC | PRN

## 2016-11-11 MED FILL — traMADol HCL 50 MG TABS: 50 | 3 days supply | Qty: 15 | Fill #0

## 2016-11-11 MED FILL — CYCLOBENZAPRINE 10 MG TAB: 10 | 6 days supply | Qty: 20 | Fill #0

## 2016-11-11 NOTE — ED Provider Notes (Signed)
[facility] Provider Note   CSN: [id number] Arrival date & time: 11/11/16  [id number]     History   Chief Complaint Chief Complaint  Patient presents with  . Knee Pain    HPI Alexandria SellsMary E Stiverson is a 71 y.o. female.  HPI Alexandria Durham is a 71 y.o. female with history of hypertension, diabetes, osteoarthritis, presents to emergency department complaining of left back, left hip, left knee pain. Patient states all of this pain is chronic and she used to be seen by an [facility]opedic specialist [facility]. She states that symptoms that get worse over the last week. She denies any injuries. She states that she is having trouble ambulating due to pain. She is taking Tylenol 500 mg every 6 hours with no relief of her symptoms. She reports swelling to the left knee. Denies numbness or weakness in extremities. Denies any fever or chills. She has not seen her orthopedist doctor in over a year. Reports history of injections into the left knee, last about a year ago. Pain is sharp. Worse with walking. No other complaints.   Past Medical History:  Diagnosis Date  . Arthritis   . Diabetes mellitus   . Hypercholesteremia   . Hypertension     There are no active problems to display for this patient.   Past Surgical History:  Procedure Laterality Date  . BREAST CYST EXCISION    . HEMORRHOID SURGERY    . HIP SURGERY    . JOINT REPLACEMENT     hip replacement  . TONSILLECTOMY    . TUBAL LIGATION      OB History    No data available       Home Medications    Prior to Admission medications   Medication Sig Start Date End Date Taking? Authorizing Provider  acetaminophen (TYLENOL) 500 MG tablet Take 1,000 mg by mouth every 6 (six) hours as needed for moderate pain.    Historical Provider, MD  atorvastatin (LIPITOR) 20 MG tablet Take 20 mg by mouth daily.    Historical Provider, MD  buPROPion (WELLBUTRIN XL) 150 MG 24 hr tablet Take 150 mg by mouth every evening.    Historical  Provider, MD  diphenhydramine-acetaminophen (TYLENOL PM) 25-500 MG TABS tablet Take 2 tablets by mouth at bedtime as needed.    Historical Provider, MD  ergocalciferol (VITAMIN D2) 50000 units capsule Take 50,000 Units by mouth every Monday.    Historical Provider, MD  fluticasone (FLONASE) 50 MCG/ACT nasal spray Place 2 sprays into both nostrils daily. 10/16/16   Shawn C Joy, PA-C  hydrochlorothiazide (HYDRODIURIL) 50 MG tablet Take 50 mg by mouth daily.    Historical Provider, MD  metFORMIN (GLUMETZA) 500 MG (MOD) 24 hr tablet Take 500 mg by mouth every evening.     Historical Provider, MD  omeprazole (PRILOSEC) 20 MG capsule Take 20 mg by mouth daily.    Historical Provider, MD  valsartan (DIOVAN) 80 MG tablet Take 80 mg by mouth daily.    Historical Provider, MD    Family History No family history on file.  Social History Social History  Substance Use Topics  . Smoking status: Current Every Day Smoker    Packs/day: 0.50    Years: 0.00    Types: Cigarettes  . Smokeless tobacco: Never Used  . Alcohol use No     Allergies   Ace inhibitors; Aspirin; Codeine; and Penicillins   Review of Systems Review of Systems  Constitutional: Negative for chills  and fever.  Respiratory: Negative for cough, chest tightness and shortness of breath.   Cardiovascular: Negative for chest pain, palpitations and leg swelling.  Musculoskeletal: Positive for arthralgias and back pain. Negative for myalgias, neck pain and neck stiffness.  Skin: Negative for rash.  Neurological: Negative for dizziness, weakness, numbness and headaches.  All other systems reviewed and are negative.    Physical Exam Updated Vital Signs BP 125/70 (BP Location: Right Arm)   Pulse 82   Temp 99 F (37.2 C) (Oral)   Resp 18   Ht 5\' 4"  (1.626 m)   Wt 78 kg   SpO2 98%   BMI 29.52 kg/m   Physical Exam  Constitutional: She is oriented to person, place, and time. She appears well-developed and well-nourished. No  distress.  Eyes: Conjunctivae are normal.  Neck: Neck supple.  Musculoskeletal:  Mild swelling noted to the left knee. No erythema, no warmth to the touch. Patient is able to flex her knee up to 90 and extend completely with pain. Negative anterior-posterior drawer signs, no laxity of medial lateral stress. Diffuse tenderness over the joint, more specifically over the lateral and posterior knee. Tenderness to palpation over midline lumbar spine and left SI joint. Pain with left straight leg raise. No pain at the hip joint with internal/external rotation. No tenderness to palpation over the hip joint. Distal radial pulses intact and equal bilaterally.  Neurological: She is alert and oriented to person, place, and time.  5/5 and equal lower extremity strength. 2+ and equal patellar reflexes bilaterally. Pt able to dorsiflex bilateral toes and feet with good strength against resistance. Equal sensation bilaterally over thighs and lower legs.   Skin: Skin is warm and dry.  Nursing note and vitals reviewed.    ED Treatments / Results  Labs (all labs ordered are listed, but only abnormal results are displayed) Labs Reviewed - No data to display  EKG  EKG Interpretation None       Radiology Dg Knee Complete 4 Views Left  Result Date: 11/11/2016 CLINICAL DATA:  Four days of lateralknee pain with no known injury EXAM: LEFT KNEE - COMPLETE 4+ VIEW COMPARISON:  MRI of the left knee dated February 28, 2015 FINDINGS: The bones are subjectively adequately mineralized. There is very mild narrowing of both medial and lateral joint compartments. Small spurs arise from the articular margins of the tibial plateaus as well as from the tibial spines. Spurs arise from the superior and inferior articular margins of the patella. There is no joint effusion or acute fracture. There is calcification in the wall of the popliteal artery. IMPRESSION: Mild tricompartmental osteoarthritic change. No acute bony abnormality.  Electronically Signed   By: David  [place] M.D.   On: 11/11/2016 10:24    Procedures Procedures (including critical care time)  Medications Ordered in ED Medications  traMADol (ULTRAM) tablet 50 mg (not administered)     Initial Impression / Assessment and Plan / ED Course  I have reviewed the triage vital signs and the nursing notes.  Pertinent labs & imaging results that were available during my care of the patient were reviewed by me and considered in my medical decision making (see chart for details).     Patient in emergency department with acute on chronic left knee and lower back pain. No evidence of joint infection based on the exam, x-ray showing minimal tricompartmental arthritis. Patient was given tramadol emergency department and feels better. This is a chronic issue, we will treat with tramadol,  robaxin. discharge home with outpatient follow-up.  Vitals:   11/11/16 0855  BP: 125/70  Pulse: 82  Resp: 18  Temp: 99 F (37.2 C)  TempSrc: Oral  SpO2: 98%  Weight: 78 kg  Height: 5\' 4"  (1.626 m)     Final Clinical Impressions(s) / ED Diagnoses   Final diagnoses:  Acute pain of left knee  Lumbar radiculopathy    New Prescriptions New Prescriptions   METHOCARBAMOL (ROBAXIN) 500 MG TABLET    Take 1 tablet (500 mg total) by mouth 2 (two) times daily.   TRAMADOL (ULTRAM) 50 MG TABLET    Take 1 tablet (50 mg total) by mouth every 6 (six) hours as needed.     Jaynie Crumble, PA-C 11/11/16 1103    Pricilla Loveless, MD 11/11/16 1125

## 2016-11-11 NOTE — Discharge Instructions (Signed)
Ice and elevate your knee. Continue to take Tylenol for pain. Take tramadol for additional pain relief. Robaxin for muscle spasms. Follow-up with the orthopedic doctor for further evaluation and treatment.

## 2016-11-11 NOTE — ED Triage Notes (Signed)
Pt having left sided knee pain since Saturday.  Pt tried wearing a brace which only helped for one day.  Pt has history of knee pain.

## 2016-11-25 DIAGNOSIS — H25013 Cortical age-related cataract, bilateral: Secondary | ICD-10-CM | POA: Insufficient documentation

## 2017-03-16 DIAGNOSIS — Z9889 Other specified postprocedural states: Secondary | ICD-10-CM | POA: Insufficient documentation

## 2017-04-14 DIAGNOSIS — E890 Postprocedural hypothyroidism: Secondary | ICD-10-CM | POA: Diagnosis present

## 2017-05-06 DIAGNOSIS — Z87891 Personal history of nicotine dependence: Secondary | ICD-10-CM | POA: Insufficient documentation

## 2017-09-29 DIAGNOSIS — M5412 Radiculopathy, cervical region: Secondary | ICD-10-CM | POA: Insufficient documentation

## 2018-01-09 ENCOUNTER — Other Ambulatory Visit: Payer: Self-pay

## 2018-01-09 ENCOUNTER — Emergency Department (HOSPITAL_BASED_OUTPATIENT_CLINIC_OR_DEPARTMENT_OTHER)
Admission: EM | Admit: 2018-01-09 | Discharge: 2018-01-09 | Disposition: A | Discharge status: Home / Self Care | Payer: Medicare Other | Attending: Emergency Medicine | Admitting: Emergency Medicine

## 2018-01-09 ENCOUNTER — Encounter (HOSPITAL_BASED_OUTPATIENT_CLINIC_OR_DEPARTMENT_OTHER): Payer: Self-pay | Admitting: Emergency Medicine

## 2018-01-09 ENCOUNTER — Emergency Department (HOSPITAL_BASED_OUTPATIENT_CLINIC_OR_DEPARTMENT_OTHER): Payer: Medicare Other

## 2018-01-09 DIAGNOSIS — E119 Type 2 diabetes mellitus without complications: Secondary | ICD-10-CM | POA: Insufficient documentation

## 2018-01-09 DIAGNOSIS — I10 Essential (primary) hypertension: Secondary | ICD-10-CM | POA: Diagnosis not present

## 2018-01-09 DIAGNOSIS — J209 Acute bronchitis, unspecified: Secondary | ICD-10-CM | POA: Diagnosis not present

## 2018-01-09 DIAGNOSIS — R05 Cough: Secondary | ICD-10-CM | POA: Diagnosis present

## 2018-01-09 DIAGNOSIS — Z79899 Other long term (current) drug therapy: Secondary | ICD-10-CM | POA: Insufficient documentation

## 2018-01-09 DIAGNOSIS — F17210 Nicotine dependence, cigarettes, uncomplicated: Secondary | ICD-10-CM | POA: Diagnosis not present

## 2018-01-09 MED ORDER — HYDROCODONE-ACETAMINOPHEN 5-325 MG PO TABS
1.0000 | ORAL_TABLET | Freq: Once | ORAL | Status: AC
  Administered 2018-01-09: 1 via ORAL
  Filled 2018-01-09: qty 1

## 2018-01-09 MED ORDER — IPRATROPIUM BROMIDE 0.02 % IN SOLN
0.5000 mg | Freq: Once | RESPIRATORY_TRACT | Status: AC
  Administered 2018-01-09: 0.5 mg via RESPIRATORY_TRACT
  Filled 2018-01-09: qty 2.5

## 2018-01-09 MED ORDER — ALBUTEROL SULFATE (2.5 MG/3ML) 0.083% IN NEBU
5.0000 mg | INHALATION_SOLUTION | Freq: Once | RESPIRATORY_TRACT | Status: AC
  Administered 2018-01-09: 5 mg via RESPIRATORY_TRACT
  Filled 2018-01-09: qty 6

## 2018-01-09 MED ORDER — HYDROCODONE-HOMATROPINE 5-1.5 MG/5ML PO SYRP: 5.0000 mL | ORAL_SOLUTION | Freq: Four times a day (QID) | ORAL | 0 refills | Status: DC | PRN

## 2018-01-09 MED ORDER — AZITHROMYCIN 250 MG PO TABS: 250.0000 mg | ORAL_TABLET | Freq: Every day | ORAL | 0 refills | Status: DC

## 2018-01-09 MED ORDER — ALBUTEROL SULFATE HFA 108 (90 BASE) MCG/ACT IN AERS
2.0000 | INHALATION_SPRAY | RESPIRATORY_TRACT | Status: DC | PRN
  Administered 2018-01-09: 2 via RESPIRATORY_TRACT
  Filled 2018-01-09: qty 6.7

## 2018-01-09 MED ORDER — PREDNISONE 20 MG PO TABS: 40.0000 mg | ORAL_TABLET | Freq: Every day | ORAL | 0 refills | Status: DC

## 2018-01-09 NOTE — ED Notes (Signed)
Patient transported to X-ray 

## 2018-01-09 NOTE — ED Notes (Signed)
O2 sats 88% on room air. Pt repositioned and encouraged to cough and take a few deep breaths. Sats only increased to 91%. Placed on 2L [place]. Dr. Anitra Lauth made aware

## 2018-01-09 NOTE — ED Provider Notes (Addendum)
[facility] EMERGENCY DEPARTMENT Provider Note   CSN: [id number] Arrival date & time: 01/09/18  1119     History   Chief Complaint Chief Complaint  Patient presents with  . Cough    HPI Alexandria Durham is a 72 y.o. female.  The history is provided by the patient.  Cough  This is a new problem. Episode onset: 1 week. The problem occurs constantly. The problem has been gradually worsening. The cough is productive of sputum. There has been no fever. Associated symptoms include chest pain and shortness of breath. Pertinent negatives include no wheezing. Associated symptoms comments: Tightness and soreness with coughing.  All symptoms seem to be worse at night she is coughing all night long.. She has tried decongestants for the symptoms. The treatment provided no relief. She is a smoker. Her past medical history does not include COPD or asthma.    Past Medical History:  Diagnosis Date  . Arthritis   . Diabetes mellitus   . Hypercholesteremia   . Hypertension     There are no active problems to display for this patient.   Past Surgical History:  Procedure Laterality Date  . BREAST CYST EXCISION    . HEMORRHOID SURGERY    . HIP SURGERY    . JOINT REPLACEMENT     hip replacement  . TONSILLECTOMY    . TUBAL LIGATION       OB History   None      Home Medications    Prior to Admission medications   Medication Sig Start Date End Date Taking? Authorizing Provider  acetaminophen (TYLENOL) 500 MG tablet Take 1,000 mg by mouth every 6 (six) hours as needed for moderate pain.    [provider]  atorvastatin (LIPITOR) 20 MG tablet Take 20 mg by mouth daily.    [provider]  azithromycin (ZITHROMAX) 250 MG tablet Take 1 tablet (250 mg total) by mouth daily. Take first 2 tablets together, then 1 every day until finished. 01/09/18   Gwyneth Sprout, MD  buPROPion (WELLBUTRIN XL) 150 MG 24 hr tablet Take 150 mg by mouth every evening.    [provider]  cyclobenzaprine (FLEXERIL) 10 MG tablet Take 1 tablet (10 mg total) by mouth 3 (three) times daily as needed for muscle spasms. 11/11/16   Kirichenko, Tatyana, PA-C  diphenhydramine-acetaminophen (TYLENOL PM) 25-500 MG TABS tablet Take 2 tablets by mouth at bedtime as needed.    [provider]  ergocalciferol (VITAMIN D2) 50000 units capsule Take 50,000 Units by mouth every Monday.    [provider]  fluticasone (FLONASE) 50 MCG/ACT nasal spray Place 2 sprays into both nostrils daily. 10/16/16   Joy, Shawn C, PA-C  hydrochlorothiazide (HYDRODIURIL) 50 MG tablet Take 50 mg by mouth daily.    [provider]  HYDROcodone-homatropine (HYCODAN) 5-1.5 MG/5ML syrup Take 5 mLs by mouth every 6 (six) hours as needed for cough. 01/09/18   Gwyneth Sprout, MD  metFORMIN (GLUMETZA) 500 MG (MOD) 24 hr tablet Take 500 mg by mouth every evening.     [provider]  omeprazole (PRILOSEC) 20 MG capsule Take 20 mg by mouth daily.    [provider]  predniSONE (DELTASONE) 20 MG tablet Take 2 tablets (40 mg total) by mouth daily. 01/09/18   Gwyneth Sprout, MD  traMADol (ULTRAM) 50 MG tablet Take 1 tablet (50 mg total) by mouth every 6 (six) hours as needed. 11/11/16   Kirichenko, Tatyana, PA-C  valsartan (DIOVAN)  80 MG tablet Take 80 mg by mouth daily.    [provider]    Family History No family history on file.  Social History Social History   Tobacco Use  . Smoking status: Current Every Day Smoker    Packs/day: 0.50    Years: 0.00    Pack years: 0.00    Types: Cigarettes  . Smokeless tobacco: Never Used  Substance Use Topics  . Alcohol use: No  . Drug use: No     Allergies   Ace inhibitors; Aspirin; Codeine; and Penicillins   Review of Systems Review of Systems  Respiratory: Positive for cough and shortness of breath. Negative for wheezing.   Cardiovascular: Positive for chest pain.  All other systems reviewed and  are negative.    Physical Exam Updated Vital Signs BP (!) 113/56 (BP Location: Left Arm)   Pulse 74   Temp 98.1 F (36.7 C) (Oral)   Resp 14   Ht  (1.626 m)   Wt 73.5 kg (162 lb)   SpO2 96%   BMI 27.81 kg/m   Physical Exam  Constitutional: She is oriented to person, place, and time. She appears well-developed and well-nourished. No distress.  HENT:  Head: Normocephalic and atraumatic.  Mouth/Throat: Oropharynx is clear and moist.  Eyes: Pupils are equal, round, and reactive to light. Conjunctivae and EOM are normal.  Neck: Normal range of motion. Neck supple.  Cardiovascular: Normal rate, regular rhythm and intact distal pulses.  Murmur heard.  Systolic murmur is present with a grade of 2/6. Pulmonary/Chest: Effort normal. No respiratory distress. She has decreased breath sounds. She has no wheezes. She has no rales.  Abdominal: Soft. She exhibits no distension. There is no tenderness. There is no rebound and no guarding.  Musculoskeletal: Normal range of motion. She exhibits no edema or tenderness.  Neurological: She is alert and oriented to person, place, and time.  Skin: Skin is warm and dry. No rash noted. No erythema.  Psychiatric: She has a normal mood and affect. Her behavior is normal.  Nursing note and vitals reviewed.    ED Treatments / Results  Labs (all labs ordered are listed, but only abnormal results are displayed) Labs Reviewed - No data to display  EKG EKG Interpretation  Date/Time:  Sunday Jan 09 2018 11:29:40 EDT Ventricular Rate:  83 PR Interval:    QRS Duration: 89 QT Interval:  372 QTC Calculation: 438 R Axis:   -7 Text Interpretation:  Sinus rhythm Abnormal R-wave progression, early transition No significant change since last tracing Confirmed by Gwyneth Sprout ([id number]) on 01/09/2018 11:49:33 AM   Radiology Dg Chest 2 View  Result Date: 01/09/2018 CLINICAL DATA:  Cough and increased chest pressure for 1 week. EXAM: CHEST - 2 VIEW  COMPARISON:  October 16, 2016 FINDINGS: The heart size and mediastinal contours are within normal limits. Both lungs are clear. Mild degenerative joint changes of the spine are noted. IMPRESSION: No active cardiopulmonary disease. Electronically Signed   By: Sherian Rein M.D.   On: 01/09/2018 11:50    Procedures Procedures (including critical care time)  Medications Ordered in ED Medications  albuterol (PROVENTIL HFA;VENTOLIN HFA) 108 (90 Base) MCG/ACT inhaler 2 puff (has no administration in time range)  albuterol (PROVENTIL) (2.5 MG/3ML) 0.083% nebulizer solution 5 mg (5 mg Nebulization Given 01/09/18 1213)  ipratropium (ATROVENT) nebulizer solution 0.5 mg (0.5 mg Nebulization Given 01/09/18 1213)  HYDROcodone-acetaminophen (NORCO/VICODIN) 5-325 MG per tablet 1 tablet (1 tablet Oral Given 01/09/18  1347)     Initial Impression / Assessment and Plan / ED Course  I have reviewed the triage vital signs and the nursing notes.  Pertinent labs & imaging results that were available during my care of the patient were reviewed by me and considered in my medical decision making (see chart for details).     Patient is a 72 year old female with a history of diabetes/htn who is presenting today with 1 week of URI symptoms with chest congestion, cough and runny nose.  Her cough is been productive of a green and yellow sputum.  She states things seem to be okay during the day but everything is much worse at night.  She is coughing all night long and is making her chest sore.  Today she also experienced some shortness of breath.  She denies any lower extremity swelling, history of cardiac issues and no recent medication changes.  Patient given albuterol and Atrovent.  This made her cough continually which made her chest hurt worse and she started splinting with breathing.  Initially sats were 88 to 89% and she felt short of breath.  She was given oxygen with improvement.  She was given a Vicodin for her chest  pain.  Chest x-ray is negative for infiltrate.  Low suspicion for PE, dissection or ACS.  She had a screening EKG without acute findings. After nebulizer patient's breath sounds improved.  No wheezing.  After pain medication patient felt much better.  Oxygen was removed and oxygen saturation remained stable.  Patient was ambulated and O2 sats remained at 96%.  She states she felt much better.  Given her smoking history and symptoms will cover for possible atypical bacteria.  Patient given azithromycin, prednisone, and inhaler and cough suppressant. Final Clinical Impressions(s) / ED Diagnoses   Final diagnoses:  Acute bronchitis, unspecified organism    ED Discharge Orders        Ordered    azithromycin (ZITHROMAX) 250 MG tablet  Daily     01/09/18 1531    predniSONE (DELTASONE) 20 MG tablet  Daily     01/09/18 1531    HYDROcodone-homatropine (HYCODAN) 5-1.5 MG/5ML syrup  Every 6 hours PRN     01/09/18 1531       Gwyneth Sprout, MD 01/09/18 1542    Gwyneth Sprout, MD 01/09/18 1549

## 2018-01-09 NOTE — Discharge Instructions (Signed)
If symptoms worsen you develop more shortness of breath, high fever or nausea or vomiting please return for recheck

## 2018-01-09 NOTE — ED Notes (Signed)
ED Provider at bedside and removed 2L nasal cannula

## 2018-01-09 NOTE — ED Triage Notes (Signed)
States," I have a bad cold and it hurts in my chest when I cough" Productive cough, denies chest pain at present

## 2018-02-24 DIAGNOSIS — E782 Mixed hyperlipidemia: Secondary | ICD-10-CM | POA: Diagnosis present

## 2018-06-06 DIAGNOSIS — N1831 Chronic kidney disease, stage 3a: Secondary | ICD-10-CM | POA: Diagnosis present

## 2018-07-06 DIAGNOSIS — M47816 Spondylosis without myelopathy or radiculopathy, lumbar region: Secondary | ICD-10-CM | POA: Insufficient documentation

## 2018-07-06 DIAGNOSIS — M5416 Radiculopathy, lumbar region: Secondary | ICD-10-CM | POA: Insufficient documentation

## 2019-05-10 DIAGNOSIS — I6523 Occlusion and stenosis of bilateral carotid arteries: Secondary | ICD-10-CM | POA: Insufficient documentation

## 2019-05-10 DIAGNOSIS — I739 Peripheral vascular disease, unspecified: Secondary | ICD-10-CM | POA: Diagnosis present

## 2019-09-20 DIAGNOSIS — G5602 Carpal tunnel syndrome, left upper limb: Secondary | ICD-10-CM | POA: Insufficient documentation

## 2021-05-25 ENCOUNTER — Other Ambulatory Visit: Payer: Self-pay

## 2021-05-25 ENCOUNTER — Emergency Department (HOSPITAL_BASED_OUTPATIENT_CLINIC_OR_DEPARTMENT_OTHER): Payer: Medicare HMO

## 2021-05-25 ENCOUNTER — Emergency Department (HOSPITAL_BASED_OUTPATIENT_CLINIC_OR_DEPARTMENT_OTHER)
Admission: EM | Admit: 2021-05-25 | Discharge: 2021-05-25 | Disposition: A | Discharge status: Home / Self Care | Payer: Medicare HMO | Attending: Emergency Medicine | Admitting: Emergency Medicine

## 2021-05-25 ENCOUNTER — Encounter (HOSPITAL_BASED_OUTPATIENT_CLINIC_OR_DEPARTMENT_OTHER): Payer: Self-pay

## 2021-05-25 DIAGNOSIS — I10 Essential (primary) hypertension: Secondary | ICD-10-CM | POA: Insufficient documentation

## 2021-05-25 DIAGNOSIS — W010XXA Fall on same level from slipping, tripping and stumbling without subsequent striking against object, initial encounter: Secondary | ICD-10-CM | POA: Diagnosis not present

## 2021-05-25 DIAGNOSIS — Z7984 Long term (current) use of oral hypoglycemic drugs: Secondary | ICD-10-CM | POA: Diagnosis not present

## 2021-05-25 DIAGNOSIS — F17210 Nicotine dependence, cigarettes, uncomplicated: Secondary | ICD-10-CM | POA: Diagnosis not present

## 2021-05-25 DIAGNOSIS — Z79899 Other long term (current) drug therapy: Secondary | ICD-10-CM | POA: Diagnosis not present

## 2021-05-25 DIAGNOSIS — Z96649 Presence of unspecified artificial hip joint: Secondary | ICD-10-CM | POA: Diagnosis not present

## 2021-05-25 DIAGNOSIS — E119 Type 2 diabetes mellitus without complications: Secondary | ICD-10-CM | POA: Insufficient documentation

## 2021-05-25 DIAGNOSIS — Y9222 Religious institution as the place of occurrence of the external cause: Secondary | ICD-10-CM | POA: Diagnosis not present

## 2021-05-25 DIAGNOSIS — M25552 Pain in left hip: Secondary | ICD-10-CM | POA: Insufficient documentation

## 2021-05-25 DIAGNOSIS — M25562 Pain in left knee: Secondary | ICD-10-CM

## 2021-05-25 MED ORDER — MORPHINE SULFATE (PF) 2 MG/ML IV SOLN
2.0000 mg | Freq: Once | INTRAVENOUS | Status: AC
  Administered 2021-05-25: 2 mg via INTRAMUSCULAR
  Filled 2021-05-25: qty 1

## 2021-05-25 MED ORDER — MORPHINE SULFATE (PF) 4 MG/ML IV SOLN: 4.0000 mg | Freq: Once | INTRAVENOUS | Status: DC

## 2021-05-25 MED ORDER — ONDANSETRON 4 MG PO TBDP
8.0000 mg | ORAL_TABLET | Freq: Once | ORAL | Status: AC
  Administered 2021-05-25: 8 mg via ORAL
  Filled 2021-05-25: qty 2

## 2021-05-25 MED ORDER — METHOCARBAMOL 500 MG PO TABS: 500.0000 mg | ORAL_TABLET | Freq: Two times a day (BID) | ORAL | 0 refills | Status: AC | PRN

## 2021-05-25 NOTE — ED Notes (Signed)
PT DEVELOPED THIS PAIN 3 WEEKS AGO, NO INJURY.   PAIN FROM RT. HIP TO KNEE, POSSIBLE SCIATICA PER PT.   PT HAS TAKEN OXYCODONE PTA. HAD TO HAVE ASSISTANCE TRANSFERRING FROM WC TO BED & HAVING TO USE CANE TO AMBULATE

## 2021-05-25 NOTE — ED Triage Notes (Signed)
Pt arrives in wheelchair to triage with c/o pain to left hip and leg X3 weeks, states that she fell yesterday causing more pain to her left knee states that she took a hydrocodone at home with slight pain improvement.

## 2021-05-25 NOTE — Discharge Instructions (Addendum)
You can continue to take your oxycodone as prescribed.  Please make sure you take this with food to help prevent nausea and vomiting.   I am prescribing you a strong muscle relaxer called Robaxin.  You can take this up to 2 times a day for management of your pain.  This medication can be sedating so do not mix it with alcohol.  Do not drive a car after taking it.  Please follow-up with your orthopedist at your scheduled appointment in 4 days.  If you develop any new or worsening symptoms please come back to the emergency department.  It was a pleasure to meet you.

## 2021-05-25 NOTE — ED Provider Notes (Signed)
[facility] EMERGENCY DEPARTMENT Provider Note   CSN: [id number] Arrival date & time: 05/25/21  1209     History Chief Complaint  Patient presents with   Hip Pain    Alexandria Durham is a 75 y.o. female.  HPI Patient is a 75 year old female with a medical history as noted below.  She presents to the emergency department due to left hip and knee pain.  Patient states that her symptoms started about 3 weeks ago.  Patient notes that yesterday while at church she slipped and fell landing on her left knee and her symptoms began worsening.  She now reports difficulty bearing weight left leg due to her pain.  Denies any numbness.  Denies any other regions of pain or trauma.    Past Medical History:  Diagnosis Date   Arthritis    Diabetes mellitus    Hypercholesteremia    Hypertension     There are no problems to display for this patient.   Past Surgical History:  Procedure Laterality Date   BREAST CYST EXCISION     HEMORRHOID SURGERY     HIP SURGERY     JOINT REPLACEMENT     hip replacement   TONSILLECTOMY     TUBAL LIGATION       OB History   No obstetric history on file.     No family history on file.  Social History   Tobacco Use   Smoking status: Every Day    Packs/day: 0.50    Years: 0.00    Pack years: 0.00    Types: Cigarettes   Smokeless tobacco: Never  Substance Use Topics   Alcohol use: No   Drug use: No    Home Medications Prior to Admission medications   Medication Sig Start Date End Date Taking? Authorizing Provider  methocarbamol (ROBAXIN) 500 MG tablet Take 1 tablet (500 mg total) by mouth 2 (two) times daily as needed for muscle spasms. 05/25/21  Yes Placido Sou, PA-C  acetaminophen (TYLENOL) 500 MG tablet Take 1,000 mg by mouth every 6 (six) hours as needed for moderate pain.    [provider]  atorvastatin (LIPITOR) 20 MG tablet Take 20 mg by mouth daily.    [provider]  azithromycin (ZITHROMAX) 250 MG  tablet Take 1 tablet (250 mg total) by mouth daily. Take first 2 tablets together, then 1 every day until finished. 01/09/18   Gwyneth Sprout, MD  buPROPion (WELLBUTRIN XL) 150 MG 24 hr tablet Take 150 mg by mouth every evening.    [provider]  diphenhydramine-acetaminophen (TYLENOL PM) 25-500 MG TABS tablet Take 2 tablets by mouth at bedtime as needed.    [provider]  ergocalciferol (VITAMIN D2) 50000 units capsule Take 50,000 Units by mouth every Monday.    [provider]  fluticasone (FLONASE) 50 MCG/ACT nasal spray Place 2 sprays into both nostrils daily. 10/16/16   Joy, Shawn C, PA-C  hydrochlorothiazide (HYDRODIURIL) 50 MG tablet Take 50 mg by mouth daily.    [provider]  HYDROcodone-homatropine (HYCODAN) 5-1.5 MG/5ML syrup Take 5 mLs by mouth every 6 (six) hours as needed for cough. 01/09/18   Gwyneth Sprout, MD  metFORMIN (GLUMETZA) 500 MG (MOD) 24 hr tablet Take 500 mg by mouth every evening.     [provider]  omeprazole (PRILOSEC) 20 MG capsule Take 20 mg by mouth daily.    [provider]  predniSONE (DELTASONE) 20 MG tablet Take 2 tablets (40 mg  total) by mouth daily. 01/09/18   Gwyneth Sprout, MD  traMADol (ULTRAM) 50 MG tablet Take 1 tablet (50 mg total) by mouth every 6 (six) hours as needed. 11/11/16   Kirichenko, Tatyana, PA-C  valsartan (DIOVAN) 80 MG tablet Take 80 mg by mouth daily.    [provider]    Allergies    Ace inhibitors, Aspirin, Codeine, and Penicillins  Review of Systems   Review of Systems  All other systems reviewed and are negative. Ten systems reviewed and are negative for acute change, except as noted in the HPI.   Physical Exam Updated Vital Signs BP (!) 156/83 (BP Location: Right Arm)   Pulse 81   Temp 97.8 F (36.6 C) (Oral)   Resp 18   Ht 5\' 4"  (1.626 m)   Wt 73 kg   SpO2 100%   BMI 27.64 kg/m   Physical Exam Vitals and nursing note reviewed.   Constitutional:      General: She is not in acute distress.    Appearance: Normal appearance. She is not ill-appearing, toxic-appearing or diaphoretic.  HENT:     Head: Normocephalic and atraumatic.     Right Ear: External ear normal.     Left Ear: External ear normal.     Nose: Nose normal.     Mouth/Throat:     Mouth: Mucous membranes are moist.     Pharynx: Oropharynx is clear. No oropharyngeal exudate or posterior oropharyngeal erythema.  Eyes:     Extraocular Movements: Extraocular movements intact.  Cardiovascular:     Rate and Rhythm: Normal rate.     Pulses: Normal pulses.  Pulmonary:     Effort: Pulmonary effort is normal.  Abdominal:     General: Abdomen is flat. There is no distension.  Musculoskeletal:        General: Tenderness present. Normal range of motion.     Cervical back: Normal range of motion and neck supple. No tenderness.     Comments: Moderate tenderness appreciated along the left lateral hip.  Additional moderate tenderness noted diffusely along the anterior knee.  No visible or palpable effusion noted in either joint.  Patient is able to flex the left knee to about 30 degrees.  Otherwise unable to assess range of motion due to patient's pain.  2+ pedal pulses.  Distal sensation intact.  No tenderness appreciated in the left ankle or foot.  Skin:    General: Skin is warm and dry.  Neurological:     General: No focal deficit present.     Mental Status: She is alert and oriented to person, place, and time.  Psychiatric:        Mood and Affect: Mood normal.        Behavior: Behavior normal.   ED Results / Procedures / Treatments   Labs (all labs ordered are listed, but only abnormal results are displayed) Labs Reviewed - No data to display  EKG None  Radiology DG Knee Complete 4 Views Left  Result Date: 05/25/2021 CLINICAL DATA:  Fall, left knee pain EXAM: LEFT KNEE - COMPLETE 4+ VIEW COMPARISON:  11/11/2016 FINDINGS: No evidence of acute fracture.  No malalignment. Small knee joint effusion, nonspecific. Mild tricompartmental osteoarthritis, not significantly progressed from prior. No focal soft tissue swelling. Atherosclerotic vascular calcifications are noted. IMPRESSION: 1. No acute fracture or malalignment. 2. Mild tricompartmental osteoarthritis. 3. Small knee joint effusion, nonspecific. Electronically Signed   By: 11/13/2016 D.O.   On: 05/25/2021 13:02  DG Hip Unilat W or Wo Pelvis 2-3 Views Left  Result Date: 05/25/2021 CLINICAL DATA:  Fall with left hip pain EXAM: DG HIP (WITH OR WITHOUT PELVIS) 2-3V LEFT COMPARISON:  None. FINDINGS: No acute fracture or dislocation of the left hip. Joint space is relatively well preserved. 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 Pelvic bony ring appears intact. No soft tissue abnormality. Scattered vascular calcifications. IMPRESSION: No acute osseous abnormality of the left hip. Electronically Signed   By: Duanne Guess D.O.   On: 05/25/2021 13:03    Procedures Procedures   Medications Ordered in ED Medications  ondansetron (ZOFRAN-ODT) disintegrating tablet 8 mg (8 mg Oral Given 05/25/21 1515)  morphine 2 MG/ML injection 2 mg (2 mg Intramuscular Given 05/25/21 1516)    ED Course  I have reviewed the triage vital signs and the nursing notes.  Pertinent labs & imaging results that were available during my care of the patient were reviewed by me and considered in my medical decision making (see chart for details).    MDM Rules/Calculators/A&P                          Pt is a 75 y.o. female who presents to the emergency department due to acute on chronic left hip and knee pain  Imaging: X-ray of the left hip is negative. X-ray of the left knee shows no acute fracture or malalignment.  Mild tricompartmental osteoarthritis.  Small knee joint effusion, nonspecific.  I, Placido Sou, PA-C, personally reviewed and evaluated these images and lab results as  part of my medical decision-making.  Patient is neurovascularly bilateral lower extremities.  Tenderness appreciated to the left lateral hip as well as right anterior knee.  No visible or palpable joint effusion noted.  No increased warmth.  No skin changes.  Patient afebrile and not tachycardic.  Doubt septic joint.  Patient additionally notes pain radiating down from the left to the left posterior leg.  Unable to perform straight leg raise due to patient's pain.  Possible concomitant sciatica.  Patient has a history of diabetes mellitus so will not move forward with systemic steroids at this time.  Patient given a dose of IM morphine as well as p.o. Zofran.  Reassessed and notes moderate improvement in her symptoms.  She is now sleeping in bed.  Feel patient is stable for discharge at this time and she is agreeable.  Patient was prescribed 10 mg oxycodone by her PCP.  Urged her to continue taking it as prescribed for breakthrough symptoms.  Will also discharge on a short course of robaxin. Recommended that she follow-up with her orthopedist next week at her scheduled appointment.  We discussed return precautions.  Her questions were answered and she was amicable at the time of discharge.  Note: Portions of this report may have been transcribed using voice recognition software. Every effort was made to ensure accuracy; however, inadvertent computerized transcription errors may be present.   Final Clinical Impression(s) / ED Diagnoses Final diagnoses:  Left hip pain  Acute pain of left knee   Rx / DC Orders ED Discharge Orders          Ordered    methocarbamol (ROBAXIN) 500 MG tablet  2 times daily PRN        05/25/21 1640             Placido Sou, PA-C 05/25/21 1706    Alvira Monday, MD 05/26/21 [phone]

## 2022-11-27 ENCOUNTER — Encounter (HOSPITAL_BASED_OUTPATIENT_CLINIC_OR_DEPARTMENT_OTHER): Payer: Self-pay | Admitting: Emergency Medicine

## 2022-11-27 ENCOUNTER — Other Ambulatory Visit: Payer: Self-pay

## 2022-11-27 ENCOUNTER — Emergency Department (HOSPITAL_BASED_OUTPATIENT_CLINIC_OR_DEPARTMENT_OTHER)
Admission: EM | Admit: 2022-11-27 | Discharge: 2022-11-27 | Disposition: A | Discharge status: Home / Self Care | Payer: Medicare HMO | Attending: Emergency Medicine | Admitting: Emergency Medicine

## 2022-11-27 DIAGNOSIS — R252 Cramp and spasm: Secondary | ICD-10-CM | POA: Diagnosis present

## 2022-11-27 LAB — CBC WITH DIFFERENTIAL/PLATELET
Abs Immature Granulocytes: 0.02 10*3/uL (ref 0.00–0.07)
Basophils Absolute: 0 10*3/uL (ref 0.0–0.1)
Basophils Relative: 0 %
Eosinophils Absolute: 0.1 10*3/uL (ref 0.0–0.5)
Eosinophils Relative: 1 %
HCT: 39.7 % (ref 36.0–46.0)
Hemoglobin: 13.6 g/dL (ref 12.0–15.0)
Immature Granulocytes: 0 %
Lymphocytes Relative: 28 %
Lymphs Abs: 2.4 10*3/uL (ref 0.7–4.0)
MCH: 31 pg (ref 26.0–34.0)
MCHC: 34.3 g/dL (ref 30.0–36.0)
MCV: 90.4 fL (ref 80.0–100.0)
Monocytes Absolute: 0.7 10*3/uL (ref 0.1–1.0)
Monocytes Relative: 8 %
Neutro Abs: 5.3 10*3/uL (ref 1.7–7.7)
Neutrophils Relative %: 63 %
Platelets: 272 10*3/uL (ref 150–400)
RBC: 4.39 MIL/uL (ref 3.87–5.11)
RDW: 14.4 % (ref 11.5–15.5)
WBC: 8.5 10*3/uL (ref 4.0–10.5)
nRBC: 0 % (ref 0.0–0.2)

## 2022-11-27 LAB — COMPREHENSIVE METABOLIC PANEL
ALT: 19 U/L (ref 0–44)
AST: 24 U/L (ref 15–41)
Albumin: 3.9 g/dL (ref 3.5–5.0)
Alkaline Phosphatase: 63 U/L (ref 38–126)
Anion gap: 13 (ref 5–15)
BUN: 29 mg/dL — ABNORMAL HIGH (ref 8–23)
CO2: 20 mmol/L — ABNORMAL LOW (ref 22–32)
Calcium: 9.5 mg/dL (ref 8.9–10.3)
Chloride: 101 mmol/L (ref 98–111)
Creatinine, Ser: 1.58 mg/dL — ABNORMAL HIGH (ref 0.44–1.00)
GFR, Estimated: 34 mL/min — ABNORMAL LOW (ref >60)
Glucose, Bld: 121 mg/dL — ABNORMAL HIGH (ref 70–99)
Potassium: 3.9 mmol/L (ref 3.5–5.1)
Sodium: 134 mmol/L — ABNORMAL LOW (ref 135–145)
Total Bilirubin: 0.3 mg/dL (ref 0.3–1.2)
Total Protein: 8.1 g/dL (ref 6.5–8.1)

## 2022-11-27 LAB — TROPONIN I (HIGH SENSITIVITY): Troponin I (High Sensitivity): 6 ng/L (ref <18)

## 2022-11-27 MED ORDER — ONDANSETRON 4 MG PO TBDP
4.0000 mg | ORAL_TABLET | Freq: Once | ORAL | Status: DC
  Filled 2022-11-27: qty 1

## 2022-11-27 MED ORDER — DICLOFENAC SODIUM 1 % EX GEL: 2.0000 g | Freq: Four times a day (QID) | CUTANEOUS | 1 refills | Status: DC | PRN

## 2022-11-27 NOTE — Discharge Instructions (Addendum)
Your labs were overall reassuring. You do not have significant electrolyte abnormalities.  For your cramping pain, you can take Tylenol and use voltaren gel as needed.  Follow up with your primary care provider.   I would encourage you to eat regular meals and stay hydrated.

## 2022-11-27 NOTE — ED Triage Notes (Signed)
Patient here with cramps in bilateral feet, legs and hands.  Patient states that it has been going on all night long, states that the pain has not released after taking her magnesium pills.

## 2022-11-27 NOTE — ED Provider Notes (Signed)
[place] [facility]ER[facility] Provider Note   CSN: [id number] Arrival date & time: 11/27/22  0038     History  Chief Complaint  Patient presents with   Body Cramps    Alexandria Durham is a 77 y.o. female.  Patient presents for evaluation of intermittent cramping in her bilateral feet, legs, hands, arms.  Has been happening more often this last week.  Seems to be worse when laying in bed.  She typically takes magnesium oxide and does theraworx with relief but tonight it was not helping.  She admits to having decreased appetite lately but has been trying to work on hydration with water. She did notice that after she drank water she vomited it up.  She called over to [employer] and explained her symptoms of diffuse cramping and was advised to try 2 Tylenol tablets.  She feels that perhaps this has "kicked in" as her symptoms have resolved at this time.  She also notes some right-sided chest discomfort, but it is not a pain.  No associated shortness of breath, nausea, radiation. She reports having a cardiologist whom she follows up with, next appointment in May.       Home Medications Prior to Admission medications   Medication Sig Start Date End Date Taking? Authorizing Provider  diclofenac Sodium (VOLTAREN) 1 % GEL Apply 2 g topically 4 (four) times daily as needed (pain). 11/27/22  Yes Sharion Settler, DO  acetaminophen (TYLENOL) 500 MG tablet Take 1,000 mg by mouth every 6 (six) hours as needed for moderate pain.    [provider]  atorvastatin (LIPITOR) 20 MG tablet Take 20 mg by mouth daily.    [provider]  azithromycin (ZITHROMAX) 250 MG tablet Take 1 tablet (250 mg total) by mouth daily. Take first 2 tablets together, then 1 every day until finished. 01/09/18   Blanchie Dessert, MD  buPROPion (WELLBUTRIN XL) 150 MG 24 hr tablet Take 150 mg by mouth every evening.    [provider]  diphenhydramine-acetaminophen  (TYLENOL PM) 25-500 MG TABS tablet Take 2 tablets by mouth at bedtime as needed.    [provider]  ergocalciferol (VITAMIN D2) 50000 units capsule Take 50,000 Units by mouth every Monday.    [provider]  fluticasone (FLONASE) 50 MCG/ACT nasal spray Place 2 sprays into both nostrils daily. 10/16/16   Joy, Shawn C, PA-C  hydrochlorothiazide (HYDRODIURIL) 50 MG tablet Take 50 mg by mouth daily.    [provider]  HYDROcodone-homatropine (HYCODAN) 5-1.5 MG/5ML syrup Take 5 mLs by mouth every 6 (six) hours as needed for cough. 01/09/18   Blanchie Dessert, MD  metFORMIN (GLUMETZA) 500 MG (MOD) 24 hr tablet Take 500 mg by mouth every evening.     [provider]  methocarbamol (ROBAXIN) 500 MG tablet Take 1 tablet (500 mg total) by mouth 2 (two) times daily as needed for muscle spasms. 05/25/21   Rayna Sexton, PA-C  omeprazole (PRILOSEC) 20 MG capsule Take 20 mg by mouth daily.    [provider]  predniSONE (DELTASONE) 20 MG tablet Take 2 tablets (40 mg total) by mouth daily. 01/09/18   Blanchie Dessert, MD  traMADol (ULTRAM) 50 MG tablet Take 1 tablet (50 mg total) by mouth every 6 (six) hours as needed. 11/11/16   Kirichenko, Tatyana, PA-C  valsartan (DIOVAN) 80 MG tablet Take 80 mg by mouth daily.    [provider]      Allergies  Ace inhibitors, Aspirin, Codeine, and Penicillins    Review of Systems   Review of Systems  Respiratory:  Negative for shortness of breath.   Cardiovascular:  Negative for chest pain.  Musculoskeletal:  Positive for arthralgias.   Physical Exam Updated Vital Signs BP (!) 161/84 (BP Location: Left Arm)   Pulse 88   Temp 97.8 F (36.6 C) (Oral)   Resp 18   SpO2 96%  Physical Exam Constitutional:      General: She is not in acute distress.    Appearance: Normal appearance. She is not ill-appearing.  HENT:     Head: Normocephalic.  Eyes:     Conjunctiva/sclera: Conjunctivae normal.   Cardiovascular:     Rate and Rhythm: Normal rate and regular rhythm.     Pulses: Normal pulses.     Heart sounds: Normal heart sounds.  Pulmonary:     Effort: Pulmonary effort is normal. No respiratory distress.     Breath sounds: Normal breath sounds.  Abdominal:     General: Bowel sounds are normal. There is no distension.     Palpations: Abdomen is soft.  Musculoskeletal:        General: No swelling or deformity. Normal range of motion.     Cervical back: Neck supple.     Comments: 5/5 strength upper and lower extremities b/l Reproducible ttp to right-chest wall.   Skin:    General: Skin is warm and dry.     Capillary Refill: Capillary refill takes less than 2 seconds.  Neurological:     General: No focal deficit present.     Mental Status: She is alert and oriented to person, place, and time. Mental status is at baseline.  Psychiatric:        Mood and Affect: Mood normal.        Behavior: Behavior normal.     ED Results / Procedures / Treatments   Labs (all labs ordered are listed, but only abnormal results are displayed) Labs Reviewed  COMPREHENSIVE METABOLIC PANEL - Abnormal; Notable for the following components:      Result Value   Sodium 134 (*)    CO2 20 (*)    Glucose, Bld 121 (*)    BUN 29 (*)    Creatinine, Ser 1.58 (*)    GFR, Estimated 34 (*)    All other components within normal limits  CBC WITH DIFFERENTIAL/PLATELET  TROPONIN I (HIGH SENSITIVITY)    EKG EKG Interpretation  Date/Time:  Friday November 27 2022 02:19:44 EDT Ventricular Rate:  77 PR Interval:  176 QRS Duration: 92 QT Interval:  387 QTC Calculation: 438 R Axis:   -4 Text Interpretation: Sinus rhythm Confirmed by Randal Buba, April ([id number]) on 11/27/2022 3:36:33 AM  Radiology No results found.  Procedures Procedures    Medications Ordered in ED Medications  ondansetron (ZOFRAN-ODT) disintegrating tablet 4 mg (4 mg Oral Not Given 11/27/22 0222)    ED Course/ Medical Decision  Making/ A&P                             Medical Decision Making 77 year old female with history of hypertension, hyperlipidemia, well-controlled diabetes, acquired hypothyroidism s/p thyroidectomy who presents for diffuse intermittent cramping of body.  Vitals notable for hypertension 161/84.  Initial labs show reassuring CBC without leukocytosis, anemia.  CMP with normal liver function.  Her creatinine appears at her baseline.  Sodium is mildly low at 134. Labs largely reassuring. No urinary  symptoms. Examination benign and symptoms seemed to resolve with Tylenol. Most likely restless legs. Given her right-sided chest discomfort will order EKG, trop though low suspicion for ACS as pain seems more MSK. Low suspicion for PE given no pleuritic pain, no hypoxia or tachycardia. Will order dose of Zofran for her nausea. Reassess after labs.   3:26 AM Initial trop 6. EKG reassuring without ST changes.   3:42 AM discussed findings with patient. She has not had any recurrence of her symptoms since being here. Seems to have had good relief with Tylenol. Will not start ropinerole or other muscle spasm medication as she is already on zanaflex, to prevent polypharmacy given age. Recommend Tylenol and Voltaren gel PRN. F/u with PCP. Stable for d/c.    Amount and/or Complexity of Data Reviewed Labs: ordered.  Risk Prescription drug management.         Final Clinical Impression(s) / ED Diagnoses Final diagnoses:  Muscle cramping   Rx / DC Orders ED Discharge Orders          Ordered    diclofenac Sodium (VOLTAREN) 1 % GEL  4 times daily PRN        11/27/22 0341              Sharion Settler, DO 11/27/22 0344    Palumbo, April, MD 11/27/22 [id number]

## 2023-03-01 DEATH — deceased

## 2023-08-02 IMAGING — CR DG KNEE COMPLETE 4+V*L*
4 series · 4 of 4 positions shown · non-contrast
Comparison: 11/11/2016

CLINICAL DATA: Fall, left knee pain

EXAM:
LEFT KNEE - COMPLETE 4+ VIEW

[t knee ap left]
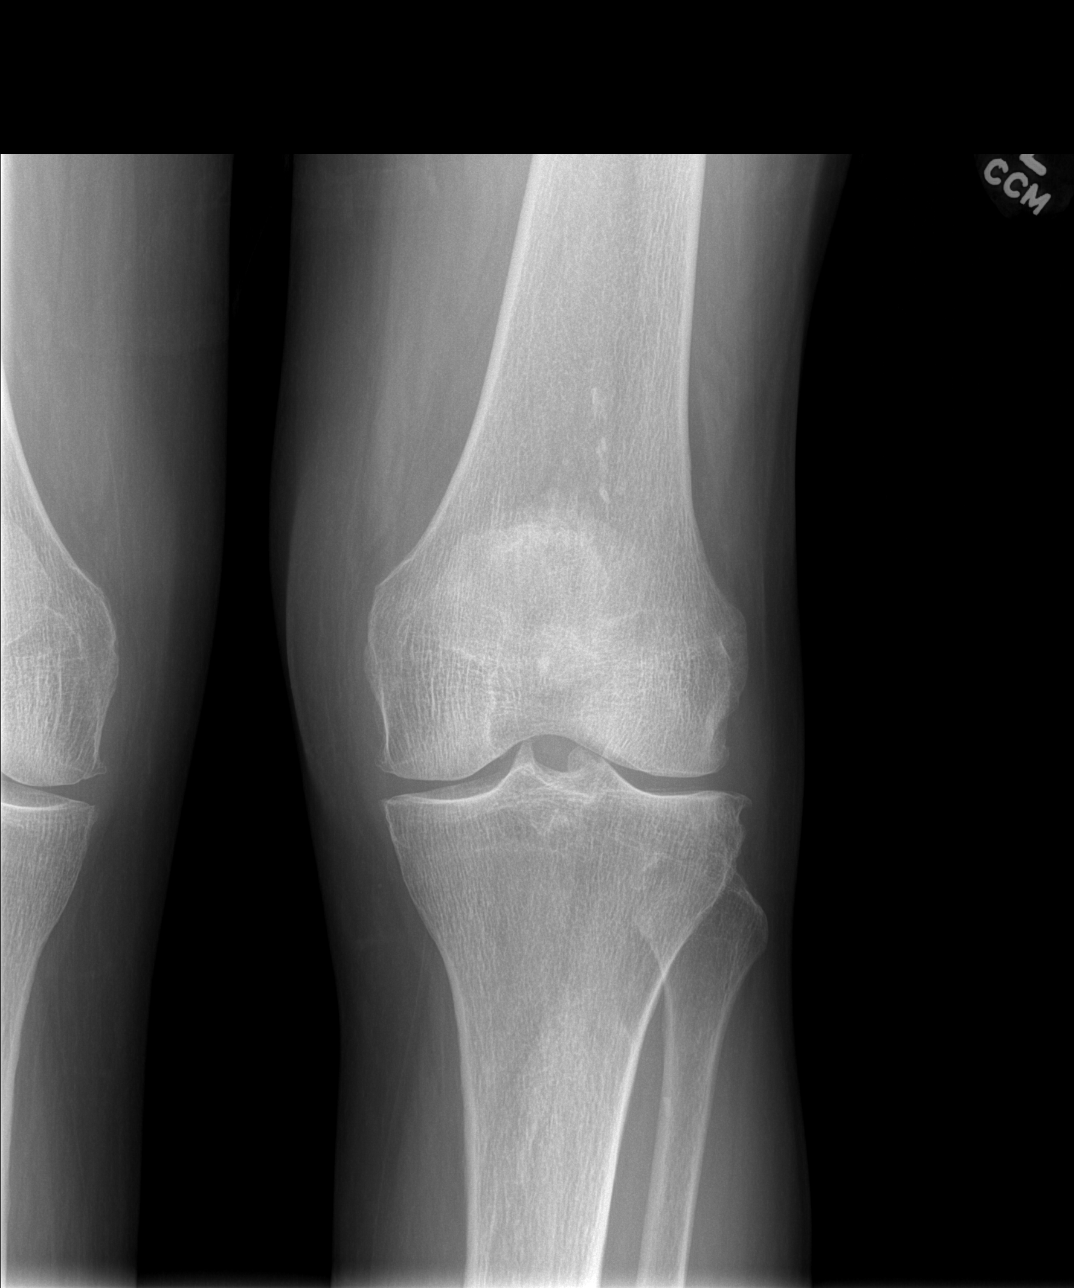

[t knee oblique left (1 of 2)]
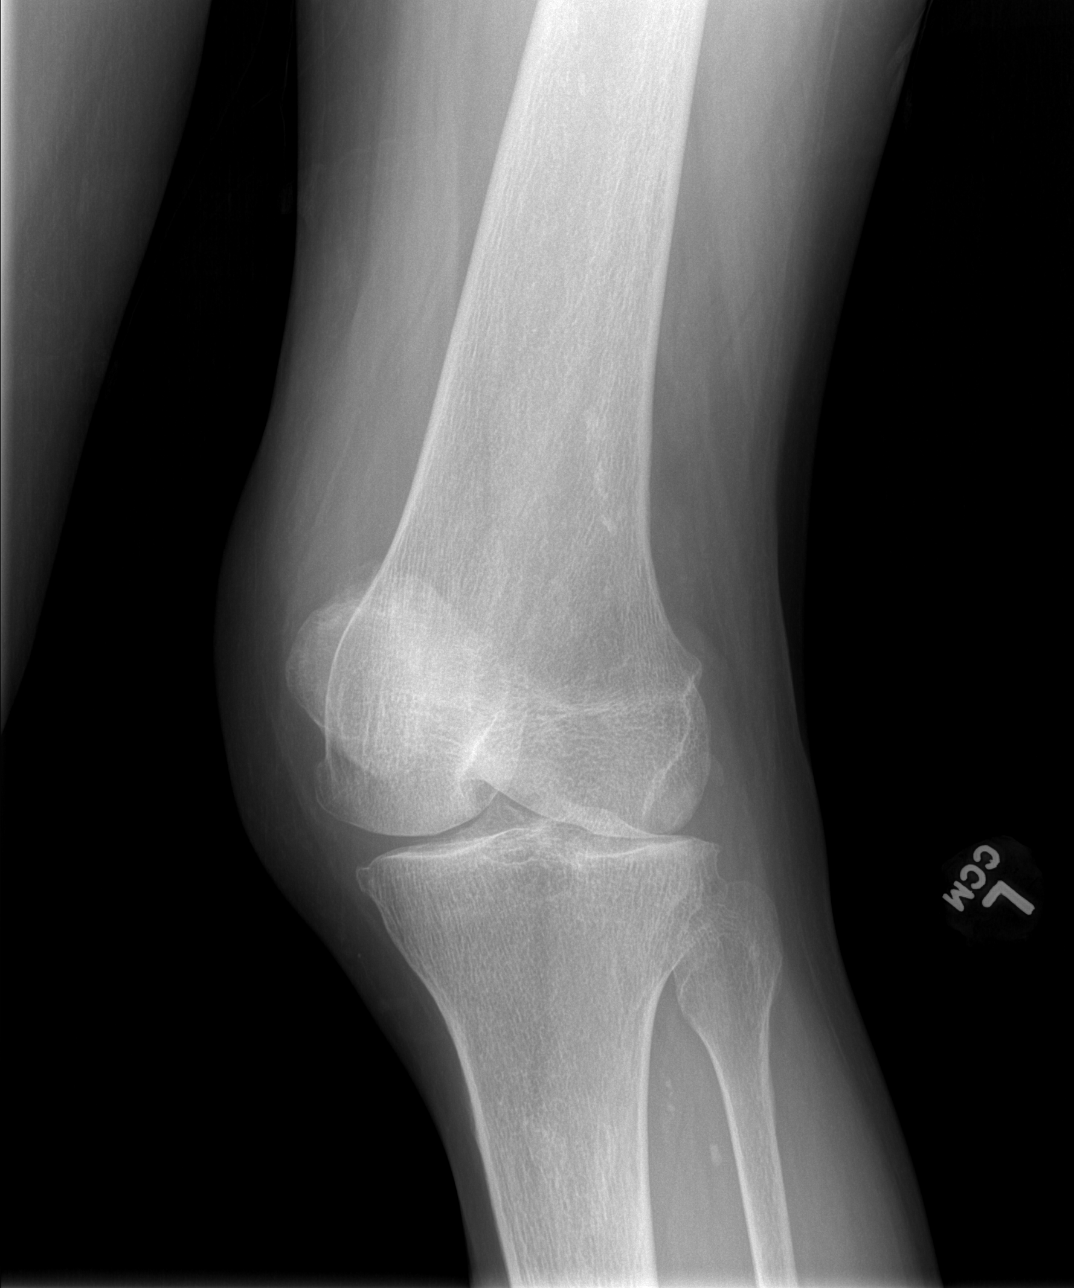

[t knee oblique left (2 of 2)]
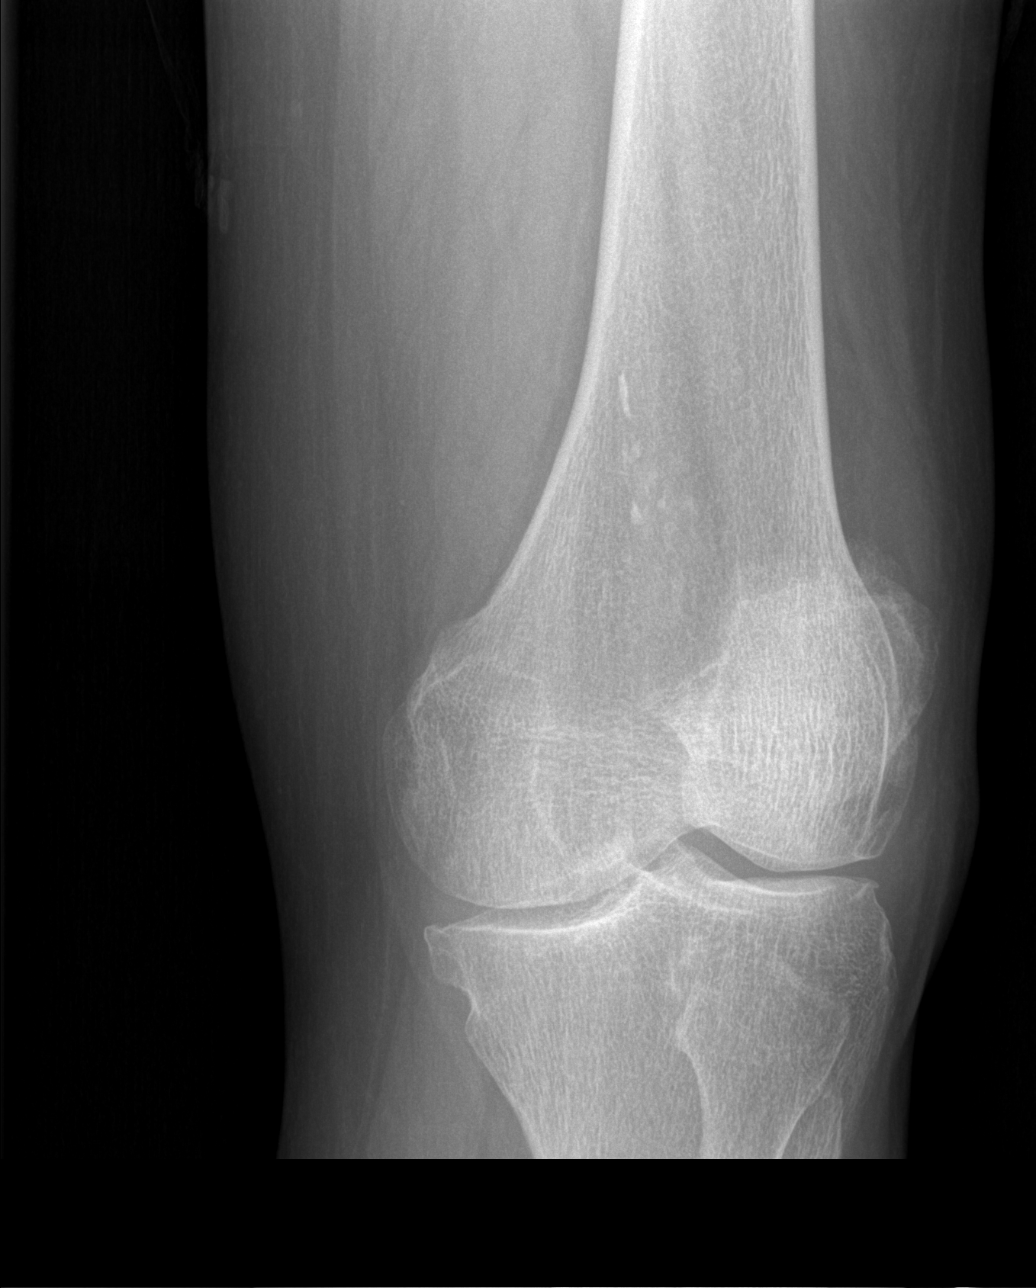

[t knee lat left]
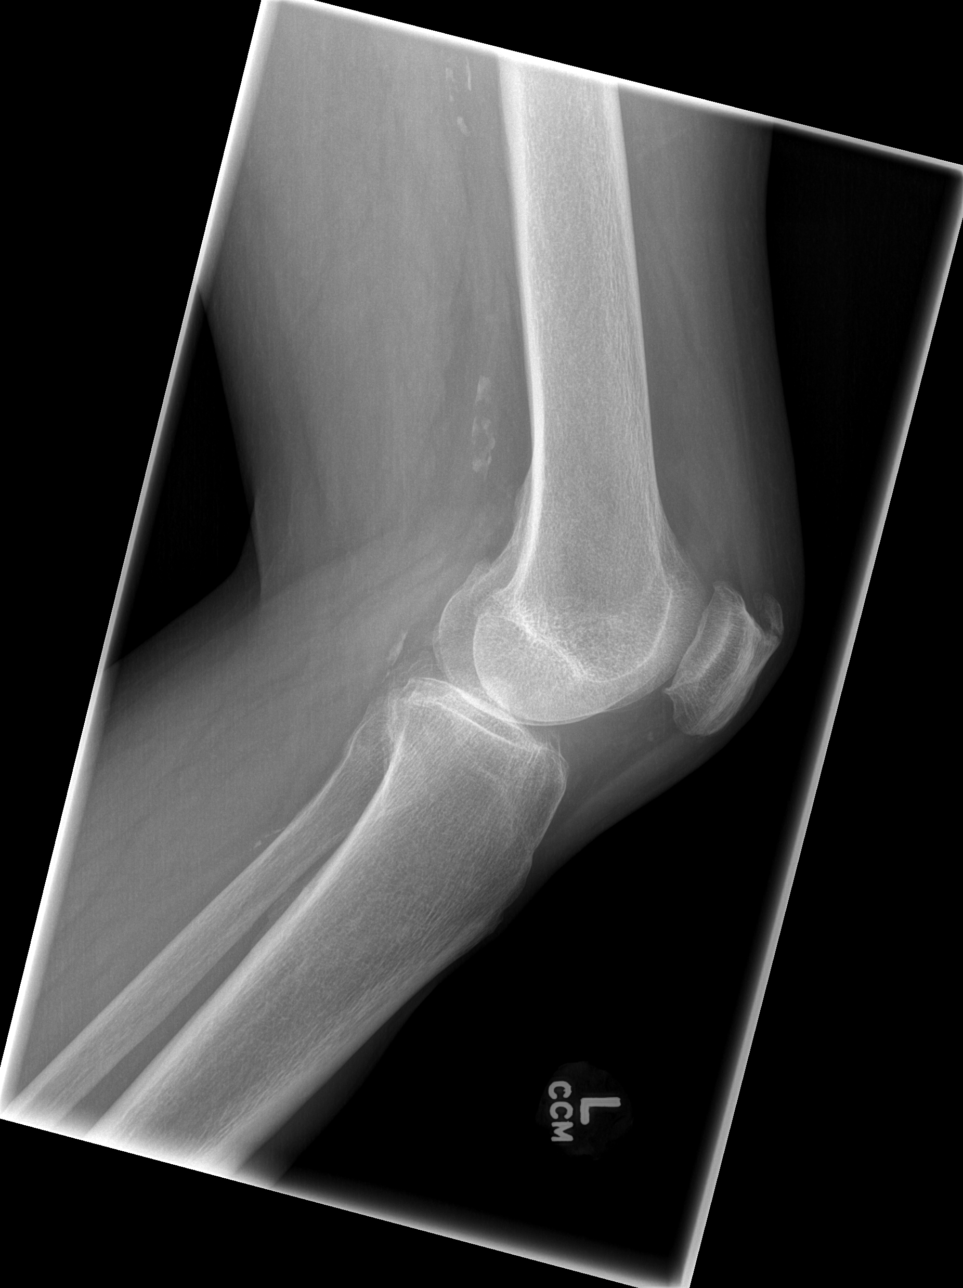

[4 of 4 positions shown; findings below may reference images not displayed]

FINDINGS: No evidence of acute fracture. No malalignment. Small knee joint
effusion, nonspecific. Mild tricompartmental osteoarthritis, not
significantly progressed from prior. No focal soft tissue swelling.
Atherosclerotic vascular calcifications are noted.
IMPRESSION: 1. No acute fracture or malalignment.
2. Mild tricompartmental osteoarthritis.
3. Small knee joint effusion, nonspecific.

## 2023-08-02 IMAGING — CR DG HIP (WITH OR WITHOUT PELVIS) 2-3V*L*
3 series · 3 of 3 positions shown · non-contrast
Comparison: None.

CLINICAL DATA: Fall with left hip pain

EXAM:
DG HIP (WITH OR WITHOUT PELVIS) 2-3V LEFT

[t pelvis a.p.]
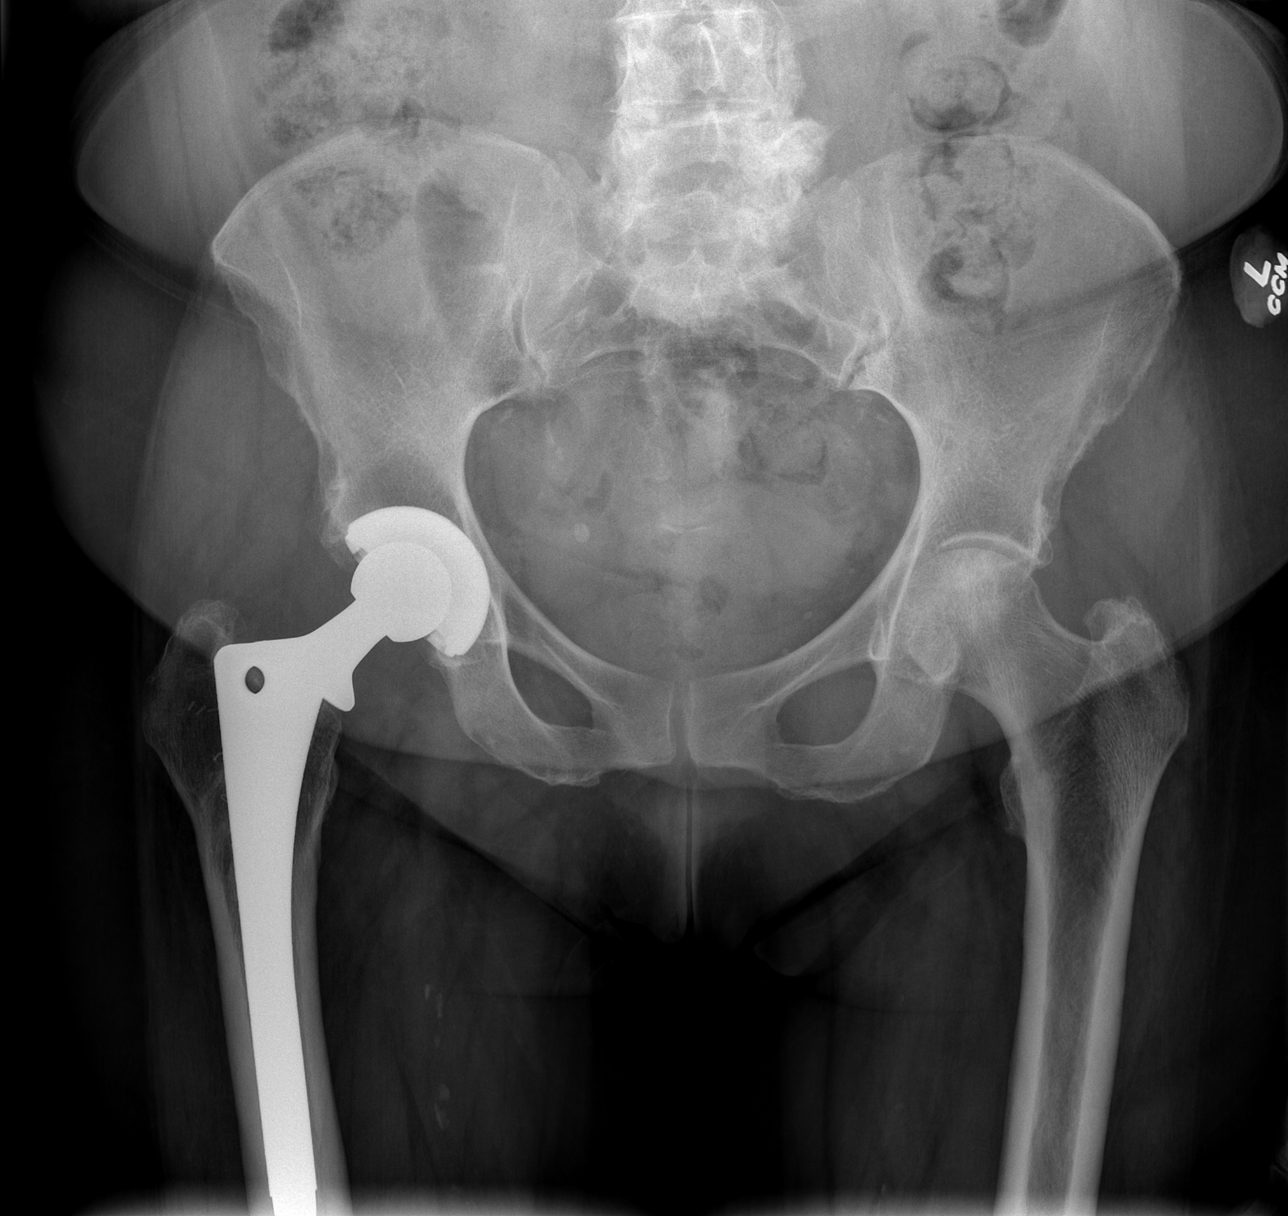

[t hip ap left]
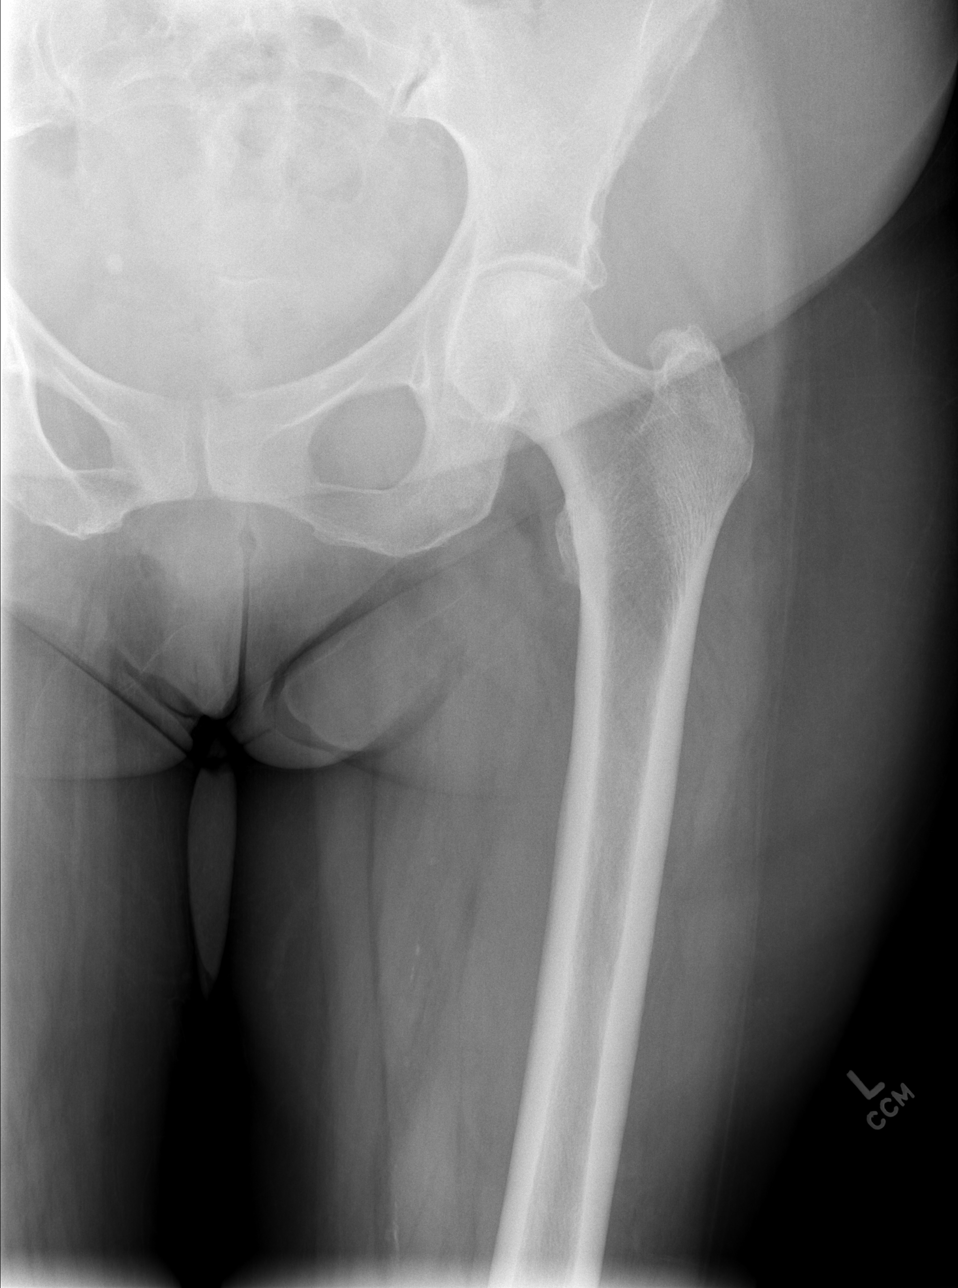

[t hip frog leg left]
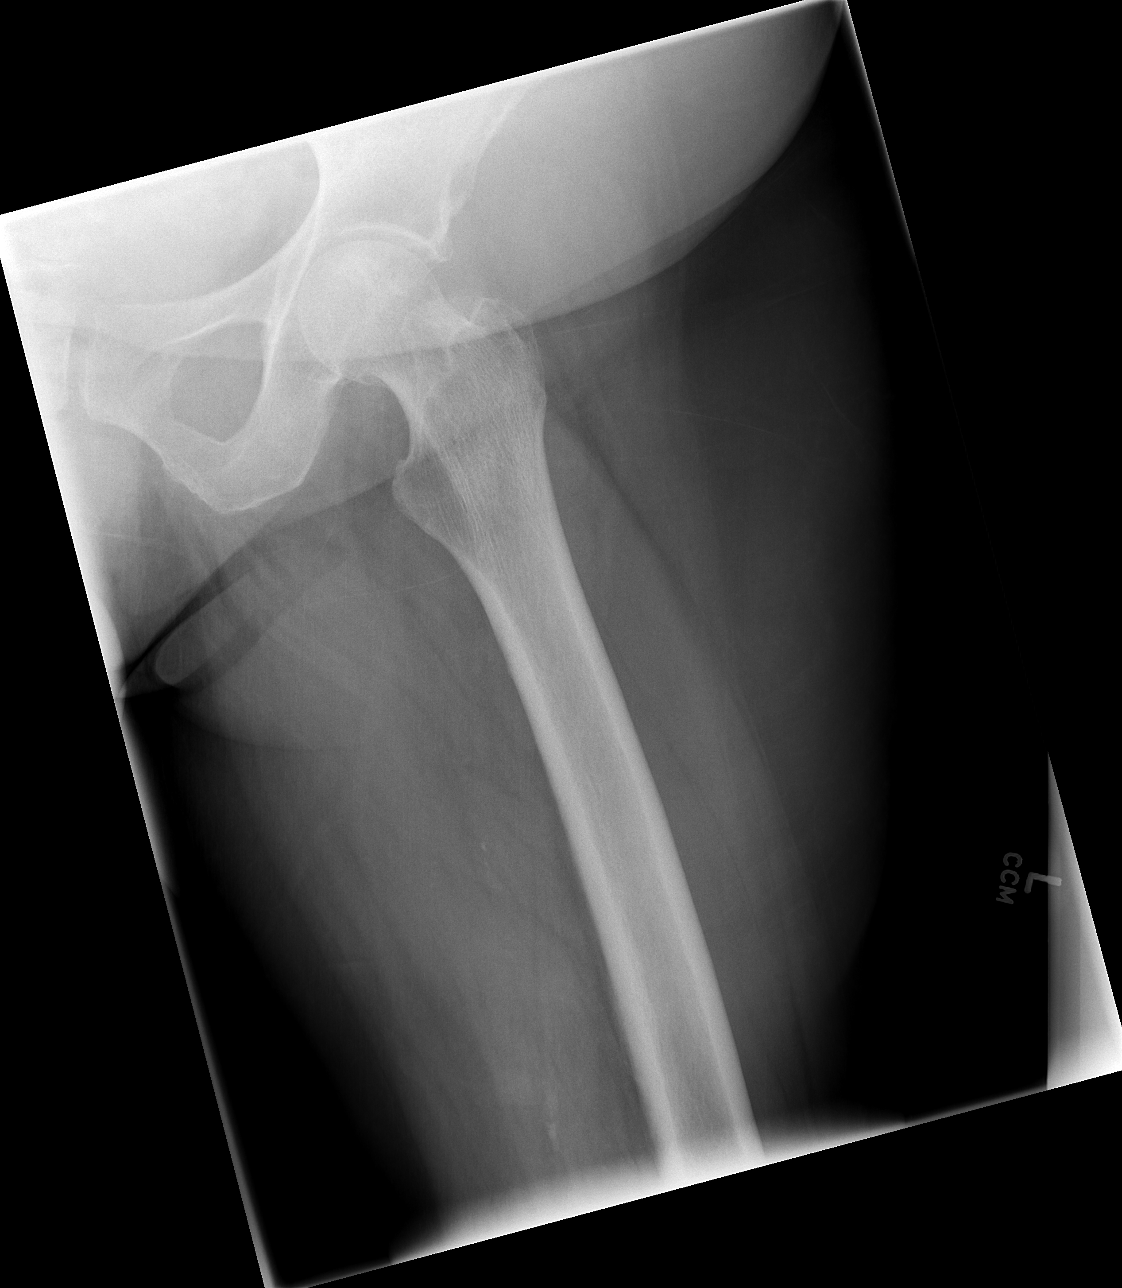

[3 of 3 positions shown; findings below may reference images not displayed]

FINDINGS: No acute fracture or dislocation of the left hip. Joint space is
relatively well preserved. Prior right total hip arthroplasty
without apparent complication. Pelvic bony ring appears intact. No
soft tissue abnormality. Scattered vascular calcifications.
IMPRESSION: No acute osseous abnormality of the left hip.

## 2023-12-23 DIAGNOSIS — M10072 Idiopathic gout, left ankle and foot: Secondary | ICD-10-CM | POA: Insufficient documentation

## 2023-12-23 DIAGNOSIS — M87052 Idiopathic aseptic necrosis of left femur: Secondary | ICD-10-CM | POA: Insufficient documentation

## 2024-01-02 ENCOUNTER — Emergency Department (HOSPITAL_BASED_OUTPATIENT_CLINIC_OR_DEPARTMENT_OTHER)

## 2024-01-02 ENCOUNTER — Inpatient Hospital Stay (HOSPITAL_BASED_OUTPATIENT_CLINIC_OR_DEPARTMENT_OTHER)
Admission: EM | Admit: 2024-01-02 | Discharge: 2024-01-09 | DRG: 940 | Disposition: A | Discharge status: Home Health | Attending: Internal Medicine | Admitting: Internal Medicine

## 2024-01-02 ENCOUNTER — Encounter (HOSPITAL_BASED_OUTPATIENT_CLINIC_OR_DEPARTMENT_OTHER): Payer: Self-pay | Admitting: Emergency Medicine

## 2024-01-02 DIAGNOSIS — E1142 Type 2 diabetes mellitus with diabetic polyneuropathy: Secondary | ICD-10-CM | POA: Diagnosis present

## 2024-01-02 DIAGNOSIS — E1122 Type 2 diabetes mellitus with diabetic chronic kidney disease: Secondary | ICD-10-CM | POA: Diagnosis present

## 2024-01-02 DIAGNOSIS — Z79899 Other long term (current) drug therapy: Secondary | ICD-10-CM

## 2024-01-02 DIAGNOSIS — I129 Hypertensive chronic kidney disease with stage 1 through stage 4 chronic kidney disease, or unspecified chronic kidney disease: Secondary | ICD-10-CM | POA: Diagnosis not present

## 2024-01-02 DIAGNOSIS — N9971 Accidental puncture and laceration of a genitourinary system organ or structure during a genitourinary system procedure: Secondary | ICD-10-CM | POA: Diagnosis not present

## 2024-01-02 DIAGNOSIS — R9389 Abnormal findings on diagnostic imaging of other specified body structures: Principal | ICD-10-CM | POA: Diagnosis present

## 2024-01-02 DIAGNOSIS — Z888 Allergy status to other drugs, medicaments and biological substances status: Secondary | ICD-10-CM

## 2024-01-02 DIAGNOSIS — K5900 Constipation, unspecified: Secondary | ICD-10-CM | POA: Diagnosis present

## 2024-01-02 DIAGNOSIS — K219 Gastro-esophageal reflux disease without esophagitis: Secondary | ICD-10-CM | POA: Diagnosis not present

## 2024-01-02 DIAGNOSIS — Z7984 Long term (current) use of oral hypoglycemic drugs: Secondary | ICD-10-CM

## 2024-01-02 DIAGNOSIS — R262 Difficulty in walking, not elsewhere classified: Secondary | ICD-10-CM | POA: Diagnosis present

## 2024-01-02 DIAGNOSIS — E782 Mixed hyperlipidemia: Secondary | ICD-10-CM | POA: Diagnosis present

## 2024-01-02 DIAGNOSIS — K660 Peritoneal adhesions (postprocedural) (postinfection): Secondary | ICD-10-CM | POA: Diagnosis present

## 2024-01-02 DIAGNOSIS — I739 Peripheral vascular disease, unspecified: Secondary | ICD-10-CM | POA: Diagnosis present

## 2024-01-02 DIAGNOSIS — Z885 Allergy status to narcotic agent status: Secondary | ICD-10-CM | POA: Diagnosis not present

## 2024-01-02 DIAGNOSIS — E890 Postprocedural hypothyroidism: Secondary | ICD-10-CM | POA: Diagnosis not present

## 2024-01-02 DIAGNOSIS — S3769XD Other injury of uterus, subsequent encounter: Secondary | ICD-10-CM

## 2024-01-02 DIAGNOSIS — Z880 Allergy status to penicillin: Secondary | ICD-10-CM

## 2024-01-02 DIAGNOSIS — N1831 Chronic kidney disease, stage 3a: Secondary | ICD-10-CM | POA: Diagnosis not present

## 2024-01-02 DIAGNOSIS — F17210 Nicotine dependence, cigarettes, uncomplicated: Secondary | ICD-10-CM | POA: Diagnosis not present

## 2024-01-02 DIAGNOSIS — K5732 Diverticulitis of large intestine without perforation or abscess without bleeding: Secondary | ICD-10-CM | POA: Diagnosis present

## 2024-01-02 DIAGNOSIS — E559 Vitamin D deficiency, unspecified: Secondary | ICD-10-CM | POA: Diagnosis present

## 2024-01-02 DIAGNOSIS — E1151 Type 2 diabetes mellitus with diabetic peripheral angiopathy without gangrene: Secondary | ICD-10-CM | POA: Diagnosis not present

## 2024-01-02 DIAGNOSIS — R102 Pelvic and perineal pain: Secondary | ICD-10-CM | POA: Diagnosis not present

## 2024-01-02 DIAGNOSIS — S3769XA Other injury of uterus, initial encounter: Secondary | ICD-10-CM | POA: Diagnosis present

## 2024-01-02 DIAGNOSIS — N719 Inflammatory disease of uterus, unspecified: Secondary | ICD-10-CM | POA: Diagnosis present

## 2024-01-02 DIAGNOSIS — Z886 Allergy status to analgesic agent status: Secondary | ICD-10-CM | POA: Diagnosis not present

## 2024-01-02 DIAGNOSIS — E119 Type 2 diabetes mellitus without complications: Secondary | ICD-10-CM

## 2024-01-02 DIAGNOSIS — F39 Unspecified mood [affective] disorder: Secondary | ICD-10-CM | POA: Diagnosis not present

## 2024-01-02 DIAGNOSIS — Z79890 Hormone replacement therapy (postmenopausal): Secondary | ICD-10-CM | POA: Diagnosis not present

## 2024-01-02 DIAGNOSIS — R109 Unspecified abdominal pain: Secondary | ICD-10-CM | POA: Diagnosis present

## 2024-01-02 DIAGNOSIS — I10 Essential (primary) hypertension: Secondary | ICD-10-CM | POA: Diagnosis present

## 2024-01-02 DIAGNOSIS — K5792 Diverticulitis of intestine, part unspecified, without perforation or abscess without bleeding: Principal | ICD-10-CM | POA: Insufficient documentation

## 2024-01-02 DIAGNOSIS — Z124 Encounter for screening for malignant neoplasm of cervix: Secondary | ICD-10-CM

## 2024-01-02 DIAGNOSIS — Z96649 Presence of unspecified artificial hip joint: Secondary | ICD-10-CM | POA: Diagnosis present

## 2024-01-02 DIAGNOSIS — Z602 Problems related to living alone: Secondary | ICD-10-CM | POA: Diagnosis present

## 2024-01-02 LAB — CBC WITH DIFFERENTIAL/PLATELET
Abs Immature Granulocytes: 0.05 10*3/uL (ref 0.00–0.07)
Basophils Absolute: 0 10*3/uL (ref 0.0–0.1)
Basophils Relative: 0 %
Eosinophils Absolute: 0.1 10*3/uL (ref 0.0–0.5)
Eosinophils Relative: 1 %
HCT: 43 % (ref 36.0–46.0)
Hemoglobin: 14.6 g/dL (ref 12.0–15.0)
Immature Granulocytes: 1 %
Lymphocytes Relative: 19 %
Lymphs Abs: 1.9 10*3/uL (ref 0.7–4.0)
MCH: 30.4 pg (ref 26.0–34.0)
MCHC: 34 g/dL (ref 30.0–36.0)
MCV: 89.6 fL (ref 80.0–100.0)
Monocytes Absolute: 0.7 10*3/uL (ref 0.1–1.0)
Monocytes Relative: 7 %
Neutro Abs: 7 10*3/uL (ref 1.7–7.7)
Neutrophils Relative %: 72 %
Platelets: 256 10*3/uL (ref 150–400)
RBC: 4.8 MIL/uL (ref 3.87–5.11)
RDW: 14.1 % (ref 11.5–15.5)
WBC: 9.7 10*3/uL (ref 4.0–10.5)
nRBC: 0 % (ref 0.0–0.2)

## 2024-01-02 LAB — COMPREHENSIVE METABOLIC PANEL WITH GFR
ALT: 29 U/L (ref 0–44)
AST: 19 U/L (ref 15–41)
Albumin: 4.2 g/dL (ref 3.5–5.0)
Alkaline Phosphatase: 70 U/L (ref 38–126)
Anion gap: 18 — ABNORMAL HIGH (ref 5–15)
BUN: 16 mg/dL (ref 8–23)
CO2: 21 mmol/L — ABNORMAL LOW (ref 22–32)
Calcium: 10.4 mg/dL — ABNORMAL HIGH (ref 8.9–10.3)
Chloride: 97 mmol/L — ABNORMAL LOW (ref 98–111)
Creatinine, Ser: 1.62 mg/dL — ABNORMAL HIGH (ref 0.44–1.00)
GFR, Estimated: 33 mL/min — ABNORMAL LOW (ref >60)
Glucose, Bld: 106 mg/dL — ABNORMAL HIGH (ref 70–99)
Potassium: 3.8 mmol/L (ref 3.5–5.1)
Sodium: 136 mmol/L (ref 135–145)
Total Bilirubin: 0.4 mg/dL (ref 0.0–1.2)
Total Protein: 8.5 g/dL — ABNORMAL HIGH (ref 6.5–8.1)

## 2024-01-02 LAB — URINALYSIS, MICROSCOPIC (REFLEX)

## 2024-01-02 LAB — URINALYSIS, ROUTINE W REFLEX MICROSCOPIC
Bilirubin Urine: NEGATIVE
Glucose, UA: NEGATIVE mg/dL
Ketones, ur: 15 mg/dL — AB
Leukocytes,Ua: NEGATIVE
Nitrite: NEGATIVE
Protein, ur: NEGATIVE mg/dL
Specific Gravity, Urine: 1.015 (ref 1.005–1.030)
pH: 5 (ref 5.0–8.0)

## 2024-01-02 LAB — LIPASE, BLOOD: Lipase: 32 U/L (ref 11–51)

## 2024-01-02 MED ORDER — ENOXAPARIN SODIUM 30 MG/0.3ML IJ SOSY
30.0000 mg | PREFILLED_SYRINGE | INTRAMUSCULAR | Status: DC
  Administered 2024-01-03: 30 mg via SUBCUTANEOUS
  Filled 2024-01-02: qty 0.3

## 2024-01-02 MED ORDER — LEVOTHYROXINE SODIUM 75 MCG PO TABS
75.0000 ug | ORAL_TABLET | Freq: Every day | ORAL | Status: DC
  Administered 2024-01-03 – 2024-01-09 (×7): 75 ug via ORAL
  Filled 2024-01-02 (×7): qty 1

## 2024-01-02 MED ORDER — FENTANYL CITRATE PF 50 MCG/ML IJ SOSY
12.5000 ug | PREFILLED_SYRINGE | INTRAMUSCULAR | Status: DC | PRN
  Administered 2024-01-06: 50 ug via INTRAVENOUS
  Filled 2024-01-02: qty 1

## 2024-01-02 MED ORDER — PANTOPRAZOLE SODIUM 40 MG PO TBEC
40.0000 mg | DELAYED_RELEASE_TABLET | Freq: Every day | ORAL | Status: DC
  Administered 2024-01-03 – 2024-01-09 (×7): 40 mg via ORAL
  Filled 2024-01-02 (×7): qty 1

## 2024-01-02 MED ORDER — ACETAMINOPHEN 500 MG PO TABS
1000.0000 mg | ORAL_TABLET | Freq: Once | ORAL | Status: AC
  Administered 2024-01-02: 1000 mg via ORAL
  Filled 2024-01-02: qty 2

## 2024-01-02 MED ORDER — ONDANSETRON HCL 4 MG/2ML IJ SOLN
4.0000 mg | Freq: Once | INTRAMUSCULAR | Status: AC
  Administered 2024-01-02: 4 mg via INTRAVENOUS
  Filled 2024-01-02: qty 2

## 2024-01-02 MED ORDER — SODIUM CHLORIDE 0.9 % IV BOLUS
500.0000 mL | Freq: Once | INTRAVENOUS | Status: AC
  Administered 2024-01-02: 500 mL via INTRAVENOUS

## 2024-01-02 MED ORDER — ACETAMINOPHEN 325 MG PO TABS
650.0000 mg | ORAL_TABLET | Freq: Four times a day (QID) | ORAL | Status: DC | PRN
  Administered 2024-01-03: 650 mg via ORAL
  Filled 2024-01-02: qty 2

## 2024-01-02 MED ORDER — ONDANSETRON HCL 4 MG PO TABS
4.0000 mg | ORAL_TABLET | Freq: Four times a day (QID) | ORAL | Status: DC | PRN
  Administered 2024-01-07 – 2024-01-08 (×2): 4 mg via ORAL
  Filled 2024-01-02 (×3): qty 1

## 2024-01-02 MED ORDER — VITAMIN D 25 MCG (1000 UNIT) PO TABS
1000.0000 [IU] | ORAL_TABLET | Freq: Every day | ORAL | Status: DC
  Administered 2024-01-03 – 2024-01-09 (×7): 1000 [IU] via ORAL
  Filled 2024-01-02 (×7): qty 1

## 2024-01-02 MED ORDER — FLUTICASONE PROPIONATE 50 MCG/ACT NA SUSP
2.0000 | Freq: Every day | NASAL | Status: DC
  Administered 2024-01-03 – 2024-01-09 (×7): 2 via NASAL
  Filled 2024-01-02: qty 16

## 2024-01-02 MED ORDER — METHOCARBAMOL 500 MG PO TABS
500.0000 mg | ORAL_TABLET | Freq: Two times a day (BID) | ORAL | Status: DC | PRN
  Administered 2024-01-07 – 2024-01-08 (×2): 500 mg via ORAL
  Filled 2024-01-02 (×2): qty 1

## 2024-01-02 MED ORDER — METOPROLOL TARTRATE 5 MG/5ML IV SOLN: 5.0000 mg | Freq: Four times a day (QID) | INTRAVENOUS | Status: DC | PRN

## 2024-01-02 MED ORDER — ACETAMINOPHEN 650 MG RE SUPP: 650.0000 mg | Freq: Four times a day (QID) | RECTAL | Status: DC | PRN

## 2024-01-02 MED ORDER — EZETIMIBE 10 MG PO TABS
10.0000 mg | ORAL_TABLET | Freq: Every day | ORAL | Status: DC
  Administered 2024-01-03 – 2024-01-09 (×7): 10 mg via ORAL
  Filled 2024-01-02 (×7): qty 1

## 2024-01-02 MED ORDER — MAGNESIUM OXIDE -MG SUPPLEMENT 400 (240 MG) MG PO TABS
400.0000 mg | ORAL_TABLET | Freq: Every day | ORAL | Status: DC
  Administered 2024-01-03 – 2024-01-09 (×7): 400 mg via ORAL
  Filled 2024-01-02 (×7): qty 1

## 2024-01-02 MED ORDER — SODIUM CHLORIDE 0.9 % IV SOLN
2.0000 g | Freq: Once | INTRAVENOUS | Status: AC
  Administered 2024-01-02: 2 g via INTRAVENOUS
  Filled 2024-01-02: qty 12.5

## 2024-01-02 MED ORDER — METRONIDAZOLE 500 MG/100ML IV SOLN
500.0000 mg | Freq: Two times a day (BID) | INTRAVENOUS | Status: DC
  Administered 2024-01-03 – 2024-01-08 (×11): 500 mg via INTRAVENOUS
  Filled 2024-01-02 (×11): qty 100

## 2024-01-02 MED ORDER — LOSARTAN POTASSIUM 50 MG PO TABS
50.0000 mg | ORAL_TABLET | Freq: Every day | ORAL | Status: DC
  Administered 2024-01-03 – 2024-01-09 (×7): 50 mg via ORAL
  Filled 2024-01-02 (×7): qty 1

## 2024-01-02 MED ORDER — METRONIDAZOLE 500 MG/100ML IV SOLN
500.0000 mg | Freq: Once | INTRAVENOUS | Status: AC
  Administered 2024-01-02: 500 mg via INTRAVENOUS
  Filled 2024-01-02: qty 100

## 2024-01-02 MED ORDER — LACTATED RINGERS IV SOLN: INTRAVENOUS | Status: AC

## 2024-01-02 MED ORDER — HYDROCHLOROTHIAZIDE 25 MG PO TABS
50.0000 mg | ORAL_TABLET | Freq: Every day | ORAL | Status: DC
  Administered 2024-01-03 – 2024-01-08 (×6): 50 mg via ORAL
  Filled 2024-01-02 (×6): qty 2

## 2024-01-02 MED ORDER — GABAPENTIN 100 MG PO CAPS
300.0000 mg | ORAL_CAPSULE | Freq: Three times a day (TID) | ORAL | Status: DC
  Administered 2024-01-02 – 2024-01-09 (×20): 300 mg via ORAL
  Filled 2024-01-02 (×20): qty 3

## 2024-01-02 MED ORDER — IOHEXOL 300 MG/ML  SOLN
60.0000 mL | Freq: Once | INTRAMUSCULAR | Status: AC | PRN
  Administered 2024-01-02: 60 mL via INTRAVENOUS

## 2024-01-02 MED ORDER — MORPHINE SULFATE (PF) 2 MG/ML IV SOLN
2.0000 mg | Freq: Once | INTRAVENOUS | Status: AC
  Administered 2024-01-02: 2 mg via INTRAVENOUS
  Filled 2024-01-02: qty 1

## 2024-01-02 MED ORDER — ONDANSETRON HCL 4 MG/2ML IJ SOLN
4.0000 mg | Freq: Four times a day (QID) | INTRAMUSCULAR | Status: DC | PRN
  Administered 2024-01-03 – 2024-01-07 (×5): 4 mg via INTRAVENOUS
  Filled 2024-01-02 (×5): qty 2

## 2024-01-02 MED ORDER — SODIUM CHLORIDE 0.9 % IV SOLN
2.0000 g | INTRAVENOUS | Status: DC
  Administered 2024-01-03 – 2024-01-08 (×6): 2 g via INTRAVENOUS
  Filled 2024-01-02 (×6): qty 20

## 2024-01-02 MED ORDER — INSULIN ASPART 100 UNIT/ML IJ SOLN
0.0000 [IU] | Freq: Three times a day (TID) | INTRAMUSCULAR | Status: DC
  Administered 2024-01-05 – 2024-01-08 (×5): 2 [IU] via SUBCUTANEOUS

## 2024-01-02 MED ORDER — LINAGLIPTIN 5 MG PO TABS
5.0000 mg | ORAL_TABLET | Freq: Every day | ORAL | Status: DC
  Administered 2024-01-03 – 2024-01-09 (×7): 5 mg via ORAL
  Filled 2024-01-02 (×7): qty 1

## 2024-01-02 MED ORDER — BUPROPION HCL ER (XL) 150 MG PO TB24
150.0000 mg | ORAL_TABLET | Freq: Every evening | ORAL | Status: DC
  Administered 2024-01-02 – 2024-01-08 (×7): 150 mg via ORAL
  Filled 2024-01-02 (×7): qty 1

## 2024-01-02 MED ORDER — HYDROCODONE-ACETAMINOPHEN 5-325 MG PO TABS
1.0000 | ORAL_TABLET | ORAL | Status: DC | PRN
Start: 2024-01-02 — End: 2024-01-07
  Administered 2024-01-03 – 2024-01-07 (×15): 2 via ORAL
  Filled 2024-01-02 (×17): qty 2

## 2024-01-02 NOTE — Assessment & Plan Note (Signed)
 Continue Januvia Sliding scale insulin

## 2024-01-02 NOTE — Assessment & Plan Note (Signed)
 Continue gabapentin.

## 2024-01-02 NOTE — Assessment & Plan Note (Signed)
 Continue ARB, hydrochlorothiazide Lopressor as needed

## 2024-01-02 NOTE — Assessment & Plan Note (Addendum)
-   Continue vitamin D repletion. 

## 2024-01-02 NOTE — ED Triage Notes (Signed)
 Pt c/o generalized abd pain since last Tues; some nausea; denies vomiting/diarrhea; last BM was this past Monday

## 2024-01-02 NOTE — ED Notes (Addendum)
 ED TO INPATIENT HANDOFF REPORT  ED Nurse Name and Phone #: Diedra Fowler RN [phone]  S Name/Age/Gender Alexandria Durham 78 y.o. female Room/Bed: MH07/MH07  Code Status   Code Status: Not on file  Home/SNF/Other Home Patient oriented to: self, place, time, and situation Is this baseline? Yes   Triage Complete: Triage complete  Chief Complaint Diverticulitis [K57.92]  Triage Note Pt c/o generalized abd pain since last Tues; some nausea; denies vomiting/diarrhea; last BM was this past Monday   Allergies Allergies  Allergen Reactions   Ace Inhibitors Hives and Itching   Aspirin Palpitations   Codeine Palpitations   Penicillins Rash    Level of Care/Admitting Diagnosis ED Disposition     ED Disposition  Admit   Condition  --   Comment  Hospital Area: [facility] [[id number]]  Level of Care: Med-Surg [16]  Interfacility transfer: Yes  May place patient in observation at [facility] or [facility] if equivalent level of care is available:: Yes  Covid Evaluation: Asymptomatic - no recent exposure (last 10 days) testing not required  Diagnosis: Diverticulitis [098119]  Admitting Physician: Lisette Ridgel  Attending Physician: Lisette Ridgel          B Medical/Surgery History Past Medical History:  Diagnosis Date   Arthritis    Diabetes mellitus    Hypercholesteremia    Hypertension    Past Surgical History:  Procedure Laterality Date   BREAST CYST EXCISION     HEMORRHOID SURGERY     HIP SURGERY     JOINT REPLACEMENT     hip replacement   TONSILLECTOMY     TUBAL LIGATION       A IV Location/Drains/Wounds Patient Lines/Drains/Airways Status     Active Line/Drains/Airways     Name Placement date Placement time Site Days   Peripheral IV 01/02/24 20 G Right Antecubital 01/02/24  1401  Antecubital  less than 1            Intake/Output Last 24 hours  Intake/Output Summary (Last 24 hours) at 01/02/2024  1943 Last data filed at 01/02/2024 1857 Gross per 24 hour  Intake 600.77 ml  Output --  Net 600.77 ml    Labs/Imaging Results for orders placed or performed during the hospital encounter of 01/02/24 (from the past 48 hours)  CBC with Differential     Status: None   Collection Time: 01/02/24  1:59 PM  Result Value Ref Range   WBC 9.7 4.0 - 10.5 K/uL   RBC 4.80 3.87 - 5.11 MIL/uL   Hemoglobin 14.6 12.0 - 15.0 g/dL   HCT 14.7 82.9 - 56.2 %   MCV 89.6 80.0 - 100.0 fL   MCH 30.4 26.0 - 34.0 pg   MCHC 34.0 30.0 - 36.0 g/dL   RDW 13.0 86.5 - 78.4 %   Platelets 256 150 - 400 K/uL   nRBC 0.0 0.0 - 0.2 %   Neutrophils Relative % 72 %   Neutro Abs 7.0 1.7 - 7.7 K/uL   Lymphocytes Relative 19 %   Lymphs Abs 1.9 0.7 - 4.0 K/uL   Monocytes Relative 7 %   Monocytes Absolute 0.7 0.1 - 1.0 K/uL   Eosinophils Relative 1 %   Eosinophils Absolute 0.1 0.0 - 0.5 K/uL   Basophils Relative 0 %   Basophils Absolute 0.0 0.0 - 0.1 K/uL   Immature Granulocytes 1 %   Abs Immature Granulocytes 0.05 0.00 - 0.07 K/uL    Comment:  Performed at [facility], [address] [place], [place] [address]  Comprehensive metabolic panel     Status: Abnormal   Collection Time: 01/02/24  1:59 PM  Result Value Ref Range   Sodium 136 135 - 145 mmol/L   Potassium 3.8 3.5 - 5.1 mmol/L   Chloride 97 (L) 98 - 111 mmol/L   CO2 21 (L) 22 - 32 mmol/L   Glucose, Bld 106 (H) 70 - 99 mg/dL    Comment: Glucose reference range applies only to samples taken after fasting for at least 8 hours.   BUN 16 8 - 23 mg/dL   Creatinine, Ser 6.04 (H) 0.44 - 1.00 mg/dL   Calcium 54.0 (H) 8.9 - 10.3 mg/dL   Total Protein 8.5 (H) 6.5 - 8.1 g/dL   Albumin 4.2 3.5 - 5.0 g/dL   AST 19 15 - 41 U/L   ALT 29 0 - 44 U/L   Alkaline Phosphatase 70 38 - 126 U/L   Total Bilirubin 0.4 0.0 - 1.2 mg/dL   GFR, Estimated 33 (L) >60 mL/min    Comment: (NOTE) Calculated using the CKD-EPI Creatinine Equation (2021)    Anion gap 18 (H) 5 -  15    Comment: Performed at [facility], [address] [place], [place] [address]  Lipase, blood     Status: None   Collection Time: 01/02/24  1:59 PM  Result Value Ref Range   Lipase 32 11 - 51 U/L    Comment: Performed at [facility], [address] [place], [place] [address]  Urinalysis, Routine w reflex microscopic -Urine, Clean Catch     Status: Abnormal   Collection Time: 01/02/24  5:15 PM  Result Value Ref Range   Color, Urine YELLOW YELLOW   APPearance CLEAR CLEAR   Specific Gravity, Urine 1.015 1.005 - 1.030   pH 5.0 5.0 - 8.0   Glucose, UA NEGATIVE NEGATIVE mg/dL   Hgb urine dipstick SMALL (A) NEGATIVE   Bilirubin Urine NEGATIVE NEGATIVE   Ketones, ur 15 (A) NEGATIVE mg/dL   Protein, ur NEGATIVE NEGATIVE mg/dL   Nitrite NEGATIVE NEGATIVE   Leukocytes,Ua NEGATIVE NEGATIVE    Comment: Performed at [facility], [address] [place], [place] [address]  Urinalysis, Microscopic (reflex)     Status: Abnormal   Collection Time: 01/02/24  5:15 PM  Result Value Ref Range   RBC / HPF 6-10 0 - 5 RBC/hpf   WBC, UA 0-5 0 - 5 WBC/hpf   Bacteria, UA FEW (A) NONE SEEN   Squamous Epithelial / HPF 11-20 0 - 5 /HPF    Comment: Performed at [facility], [address] [place], [place] [address]   CT ABDOMEN PELVIS W CONTRAST Result Date: 01/02/2024 CLINICAL DATA:  Diffuse abdominal pain and nausea for several days. EXAM: CT ABDOMEN AND PELVIS WITH CONTRAST TECHNIQUE: Multidetector CT imaging of the abdomen and pelvis was performed using the standard protocol following bolus administration of intravenous contrast. RADIATION DOSE REDUCTION: This exam was performed according to the departmental dose-optimization program which includes automated exposure control, adjustment of the mA and/or kV according to patient size and/or use of iterative reconstruction technique. CONTRAST:  60mL OMNIPAQUE IOHEXOL 300 MG/ML  SOLN COMPARISON:  12/07/2023  FINDINGS: Lower Chest: No acute findings. Hepatobiliary: No suspicious hepatic masses identified. Gallbladder is unremarkable. No evidence of biliary ductal dilatation. Pancreas:  No mass or inflammatory changes. Spleen: Within  normal limits in size and appearance. Adrenals/Urinary Tract: No suspicious masses identified. No evidence of ureteral calculi or hydronephrosis. Bladder is poorly visualized due to severe beam hardening artifact from left hip prosthesis. Stomach/Bowel: Severe diffuse colonic diverticulosis is again seen. Mild diverticulitis is seen near the junction of the descending and sigmoid colon. No evidence of perforation or abscess. Vascular/Lymphatic: No pathologically enlarged lymph nodes. No acute vascular findings. Retroaortic left renal vein again noted. Reproductive: Diffuse distension of the endometrial cavity is again seen which may be due to diffuse endometrial thickening or hydrometros. Adnexal regions are unremarkable. Other:  None. Musculoskeletal: No suspicious bone lesions identified. 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 IMPRESSION: Mild diverticulitis near the junction of the descending and sigmoid colon. No evidence of perforation or abscess. Distension of endometrial cavity again seen, which may be due to diffuse endometrial thickening or hydrometros. Endometrial carcinoma cannot be excluded in a postmenopausal female. GYN consultation is recommended. Electronically Signed   By: Marlyce Sine M.D.   On: 01/02/2024 16:18    Pending Labs Unresulted Labs (From admission, onward)    None       Vitals/Pain Today's Vitals   01/02/24 1526 01/02/24 1527 01/02/24 1607 01/02/24 1803  BP:    (!) 143/72  Pulse:    81  Resp:    18  Temp:    98.3 F (36.8 C)  TempSrc:    Oral  SpO2:    99%  Weight:      Height:      PainSc: 9  9  8       Isolation Precautions No active isolations  Medications Medications  ceFEPIme  (MAXIPIME) 2 g in sodium chloride  0.9 % 100 mL IVPB (0 g Intravenous Stopped 01/02/24 1857)    And  metroNIDAZOLE  (FLAGYL ) IVPB 500 mg (500 mg Intravenous New Bag/Given 01/02/24 1858)  morphine  (PF) 2 MG/ML injection 2 mg (2 mg Intravenous Given 01/02/24 1527)  ondansetron  (ZOFRAN ) injection 4 mg (4 mg Intravenous Given 01/02/24 1526)  acetaminophen  (TYLENOL ) tablet 1,000 mg (1,000 mg Oral Given 01/02/24 1526)  sodium chloride  0.9 % bolus 500 mL (0 mLs Intravenous Stopped 01/02/24 1801)  iohexol (OMNIPAQUE) 300 MG/ML solution 60 mL (60 mLs Intravenous Contrast Given 01/02/24 1549)  ondansetron  (ZOFRAN ) injection 4 mg (4 mg Intravenous Given 01/02/24 1804)    Mobility walks with device     Focused Assessments Abdominal pain - Diverticulitis- Unable to tolerate Po's    R Recommendations: See Admitting Provider Note  Report given to: Bishue , RN  Additional Notes:

## 2024-01-02 NOTE — ED Notes (Signed)
 Report Has been called to assigned nurse- Bishu. No questions from nurse at this time. [facility] in route .

## 2024-01-02 NOTE — Assessment & Plan Note (Signed)
 Continue PPI ?

## 2024-01-02 NOTE — Assessment & Plan Note (Signed)
 Continue levothyroxine at current dose Appears to be managed by her PCP and was normal 8 months ago.

## 2024-01-02 NOTE — Progress Notes (Signed)
 Plan of Care Note for accepted transfer   Patient: Alexandria Durham MRN: [id number]   DOA: 01/02/2024  Facility requesting transfer: [facility] Requesting Provider: Dorisann Garre, PA Reason for transfer: Diverticulitis Facility course: Patient is a 78 year old with history of OA, T2DM, HTN, HLD who presents with constipation for the last 5 days.  She notes abdominal pain and nausea.  She has had very poor p.o. intake for the last 5 to 7 days.  In the ED was noted to have no significant white count but a diffusely tender abdomen.  CT scan revealed diverticulitis.  She was given cefepime plus Flagyl  in the ED as well as Zofran  for p.o. challenge which she failed.  Patient lives alone and family thought it best that she be admitted to ensure improvement.  Plan of care: The patient is accepted for admission to Med-surg  unit, at [facility]..  Patient is placed in observation.  Author: Granville Layer, MD 01/02/2024  Check [email or web address] for on-call coverage.  Nursing staff, Please call [facility] Admits & Consults System-Wide number on Amion as soon as patient's arrival, so appropriate admitting provider can evaluate the pt.

## 2024-01-02 NOTE — Assessment & Plan Note (Signed)
 Continue lipid-lowering agents

## 2024-01-02 NOTE — Assessment & Plan Note (Signed)
 IV Rocephin  plus Flagyl  IV fluids Clear liquid diet Once able to tolerate p.o. and improved exam should be ready for discharge.

## 2024-01-02 NOTE — H&P (Signed)
 History and Physical    Patient: Alexandria Durham:[id number] DOB: 1946/04/22 DOA: 01/02/2024 DOS: the patient was seen and examined on 01/02/2024 PCP: Bayard Limbo, NP  Patient coming from: Home  Chief Complaint:  Chief Complaint  Patient presents with   Abdominal Pain   Constipation   HPI: Alexandria Durham is a 78 y.o. female with medical history significant of  T2DM, HTN, HLD, CKD stage IIIa, GERD who presents with 5-day history of lower abdominal pain.  Patient has been quite constipated and has had minimal appetite.  She reports that every time she tries to eat it makes her abdomen hurt so she has been avoiding all food.  She had nausea and vomiting this morning.  She denies any hematemesis.  She denies fever, chills. Patient was seen at the [facility] and noted to have acute diverticulitis and significant abdominal tenderness.  She was given IV antibiotics, antiemetics, IV fluids and a p.o. challenge.  The patient failed her p.o. challenge and we were asked to admit for continued pain control and IV antibiotics. Review of Systems: As mentioned in the history of present illness. All other systems reviewed and are negative. Past Medical History:  Diagnosis Date   Arthritis    Diabetes mellitus    Hypercholesteremia    Hypertension    Past Surgical History:  Procedure Laterality Date   BREAST CYST EXCISION     HEMORRHOID SURGERY     HIP SURGERY     JOINT REPLACEMENT     hip replacement   TONSILLECTOMY     TUBAL LIGATION     Social History:  reports that she has been smoking cigarettes. She has never used smokeless tobacco. She reports that she does not drink alcohol and does not use drugs.  Allergies  Allergen Reactions   Ace Inhibitors Hives and Itching   Aspirin Palpitations   Codeine Palpitations   Penicillins Rash    History reviewed. No pertinent family history.  Prior to Admission medications   Medication Sig Start Date End Date Taking? Authorizing  Provider  ammonium lactate (AMLACTIN) 12 % cream Apply 1 Application topically as needed for dry skin. 11/21/15  Yes [provider]  ezetimibe (ZETIA) 10 MG tablet Take 1 tablet by mouth daily. 10/29/23 10/28/24 Yes [provider]  gabapentin (NEURONTIN) 300 MG capsule Take 300 mg by mouth 3 (three) times daily. 08/03/20  Yes [provider]  hydrocortisone 2.5 % cream Apply 1 Application topically 2 (two) times daily as needed (dry hands). 09/29/18  Yes [provider]  levothyroxine (SYNTHROID) 75 MCG tablet Take 1 tablet by mouth daily. 09/22/22  Yes [provider]  magnesium oxide (MAG-OX) 400 MG tablet Take 400 mg by mouth daily. 03/01/17  Yes [provider]  mometasone (ELOCON) 0.1 % cream Apply 1 Application topically daily as needed. 10/27/23  Yes [provider]  polyethylene glycol powder (GLYCOLAX/MIRALAX) 17 GM/SCOOP powder Take 17 g by mouth 2 (two) times daily as needed for moderate constipation. 08/15/15  Yes [provider]  sitaGLIPtin (JANUVIA) 50 MG tablet Take 1 tablet by mouth daily. 10/28/22  Yes [provider]  acetaminophen  (TYLENOL ) 500 MG tablet Take 1,000 mg by mouth every 6 (six) hours as needed for moderate pain.    [provider]  buPROPion (WELLBUTRIN XL) 150 MG 24 hr tablet Take 150 mg by mouth every evening.    [provider]  Cholecalciferol (VITAMIN D3) 25 MCG (1000 UT) CAPS  Take 1,000 Units by mouth daily.    [provider]  diclofenac  Sodium (VOLTAREN ) 1 % GEL Apply 2 g topically 4 (four) times daily as needed (pain). 11/27/22   Espinoza, Alejandra, DO  diphenhydramine -acetaminophen  (TYLENOL  PM) 25-500 MG TABS tablet Take 2 tablets by mouth at bedtime as needed.    [provider]  ergocalciferol (VITAMIN D2) 50000 units capsule Take 50,000 Units by mouth every Monday.    [provider]  fluticasone  (FLONASE ) 50 MCG/ACT nasal spray Place 2  sprays into both nostrils daily. 10/16/16   Joy, Shawn C, PA-C  hydrochlorothiazide (HYDRODIURIL) 50 MG tablet Take 50 mg by mouth daily.    [provider]  HYDROcodone -homatropine (HYCODAN) 5-1.5 MG/5ML syrup Take 5 mLs by mouth every 6 (six) hours as needed for cough. 01/09/18   Almond Army, MD  metFORMIN (GLUMETZA) 500 MG (MOD) 24 hr tablet Take 500 mg by mouth every evening.     [provider]  methocarbamol  (ROBAXIN ) 500 MG tablet Take 1 tablet (500 mg total) by mouth 2 (two) times daily as needed for muscle spasms. 05/25/21   Joldersma, Logan, PA-C  omeprazole (PRILOSEC) 20 MG capsule Take 20 mg by mouth daily.    [provider]  predniSONE  (DELTASONE ) 20 MG tablet Take 2 tablets (40 mg total) by mouth daily. 01/09/18   Almond Army, MD  valsartan (DIOVAN) 80 MG tablet Take 80 mg by mouth daily.    [provider]    Physical Exam: Vitals:   01/02/24 1331 01/02/24 1803 01/02/24 1947 01/02/24 2041  BP: (!) 135/95 (!) 143/72 (!) 148/70 (!) 141/113  Pulse: 83 81 73 82  Resp: 20 18 18 16   Temp: 98.3 F (36.8 C) 98.3 F (36.8 C) 98 F (36.7 C) 98.2 F (36.8 C)  TempSrc:  Oral Oral Oral  SpO2: 99% 99% 99% 100%  Weight:    71.3 kg  Height:    5\' 4"  (1.626 m)   Physical Examination: General appearance - alert, well appearing, and in no distress Chest - clear to auscultation, no wheezes, rales or rhonchi, symmetric air entry Heart - normal rate, regular rhythm, normal S1, S2, no murmurs, rubs, clicks or gallops Abdomen - soft, fairly tender especially in left lower quadrant with guarding, negative rebound, nondistended, no masses or organomegaly Normal bowel sounds  Data Reviewed: Results for orders placed or performed during the hospital encounter of 01/02/24 (from the past 24 hours)  CBC with Differential     Status: None   Collection Time: 01/02/24  1:59 PM  Result Value Ref Range   WBC 9.7 4.0 - 10.5 K/uL   RBC 4.80 3.87 - 5.11  MIL/uL   Hemoglobin 14.6 12.0 - 15.0 g/dL   HCT 16.1 09.6 - 04.5 %   MCV 89.6 80.0 - 100.0 fL   MCH 30.4 26.0 - 34.0 pg   MCHC 34.0 30.0 - 36.0 g/dL   RDW 40.9 81.1 - 91.4 %   Platelets 256 150 - 400 K/uL   nRBC 0.0 0.0 - 0.2 %   Neutrophils Relative % 72 %   Neutro Abs 7.0 1.7 - 7.7 K/uL   Lymphocytes Relative 19 %   Lymphs Abs 1.9 0.7 - 4.0 K/uL   Monocytes Relative 7 %   Monocytes Absolute 0.7 0.1 - 1.0 K/uL   Eosinophils Relative 1 %   Eosinophils Absolute 0.1 0.0 - 0.5 K/uL   Basophils Relative 0 %   Basophils Absolute 0.0 0.0 - 0.1  K/uL   Immature Granulocytes 1 %   Abs Immature Granulocytes 0.05 0.00 - 0.07 K/uL  Comprehensive metabolic panel     Status: Abnormal   Collection Time: 01/02/24  1:59 PM  Result Value Ref Range   Sodium 136 135 - 145 mmol/L   Potassium 3.8 3.5 - 5.1 mmol/L   Chloride 97 (L) 98 - 111 mmol/L   CO2 21 (L) 22 - 32 mmol/L   Glucose, Bld 106 (H) 70 - 99 mg/dL   BUN 16 8 - 23 mg/dL   Creatinine, Ser 0.98 (H) 0.44 - 1.00 mg/dL   Calcium 11.9 (H) 8.9 - 10.3 mg/dL   Total Protein 8.5 (H) 6.5 - 8.1 g/dL   Albumin 4.2 3.5 - 5.0 g/dL   AST 19 15 - 41 U/L   ALT 29 0 - 44 U/L   Alkaline Phosphatase 70 38 - 126 U/L   Total Bilirubin 0.4 0.0 - 1.2 mg/dL   GFR, Estimated 33 (L) >60 mL/min   Anion gap 18 (H) 5 - 15  Lipase, blood     Status: None   Collection Time: 01/02/24  1:59 PM  Result Value Ref Range   Lipase 32 11 - 51 U/L  Urinalysis, Routine w reflex microscopic -Urine, Clean Catch     Status: Abnormal   Collection Time: 01/02/24  5:15 PM  Result Value Ref Range   Color, Urine YELLOW YELLOW   APPearance CLEAR CLEAR   Specific Gravity, Urine 1.015 1.005 - 1.030   pH 5.0 5.0 - 8.0   Glucose, UA NEGATIVE NEGATIVE mg/dL   Hgb urine dipstick SMALL (A) NEGATIVE   Bilirubin Urine NEGATIVE NEGATIVE   Ketones, ur 15 (A) NEGATIVE mg/dL   Protein, ur NEGATIVE NEGATIVE mg/dL   Nitrite NEGATIVE NEGATIVE   Leukocytes,Ua NEGATIVE NEGATIVE   Urinalysis, Microscopic (reflex)     Status: Abnormal   Collection Time: 01/02/24  5:15 PM  Result Value Ref Range   RBC / HPF 6-10 0 - 5 RBC/hpf   WBC, UA 0-5 0 - 5 WBC/hpf   Bacteria, UA FEW (A) NONE SEEN   Squamous Epithelial / HPF 11-20 0 - 5 /HPF   CT ABDOMEN PELVIS W CONTRAST Result Date: 01/02/2024 CLINICAL DATA:  Diffuse abdominal pain and nausea for several days. EXAM: CT ABDOMEN AND PELVIS WITH CONTRAST TECHNIQUE: Multidetector CT imaging of the abdomen and pelvis was performed using the standard protocol following bolus administration of intravenous contrast. RADIATION DOSE REDUCTION: This exam was performed according to the departmental dose-optimization program which includes automated exposure control, adjustment of the mA and/or kV according to patient size and/or use of iterative reconstruction technique. CONTRAST:  60mL OMNIPAQUE IOHEXOL 300 MG/ML  SOLN COMPARISON:  12/07/2023 FINDINGS: Lower Chest: No acute findings. Hepatobiliary: No suspicious hepatic masses identified. Gallbladder is unremarkable. No evidence of biliary ductal dilatation. Pancreas:  No mass or inflammatory changes. Spleen: Within normal limits in size and appearance. Adrenals/Urinary Tract: No suspicious masses identified. No evidence of ureteral calculi or hydronephrosis. Bladder is poorly visualized due to severe beam hardening artifact from left hip prosthesis. Stomach/Bowel: Severe diffuse colonic diverticulosis is again seen. Mild diverticulitis is seen near the junction of the descending and sigmoid colon. No evidence of perforation or abscess. Vascular/Lymphatic: No pathologically enlarged lymph nodes. No acute vascular findings. Retroaortic left renal vein again noted. Reproductive: Diffuse distension of the endometrial cavity is again seen which may be due to diffuse endometrial thickening or hydrometros. Adnexal regions are unremarkable. Other:  None. Musculoskeletal: No suspicious bone lesions identified.  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 IMPRESSION: Mild diverticulitis near the junction of the descending and sigmoid colon. No evidence of perforation or abscess. Distension of endometrial cavity again seen, which may be due to diffuse endometrial thickening or hydrometros. Endometrial carcinoma cannot be excluded in a postmenopausal female. GYN consultation is recommended. Electronically Signed   By: Marlyce Sine M.D.   On: 01/02/2024 16:18     Assessment and Plan: * Diverticulitis IV Rocephin  plus Flagyl  IV fluids Clear liquid diet Once able to tolerate p.o. and improved exam should be ready for discharge.  Vitamin D deficiency Continue vitamin D repletion  Type 2 diabetes mellitus with diabetic polyneuropathy, without long-term current use of insulin (HCC) Continue gabapentin  Type 2 diabetes mellitus (HCC) Continue Januvia Sliding scale insulin  PVD (peripheral vascular disease) (HCC) Continue lipid-lowering agents  Postoperative hypothyroidism Continue levothyroxine at current dose Appears to be managed by her PCP and was normal 8 months ago.  Mixed hyperlipidemia Has been intolerant of statins Continue Zetia  Gastroesophageal reflux disease without esophagitis Continue PPI  Essential hypertension Continue ARB, hydrochlorothiazide Lopressor as needed  Stage 3a chronic kidney disease (HCC) Serum creatinine appears at baseline at 1.62.  Patient was 1.58 approximately 1 year ago.   Avoid nephrotoxic agents Trend IV fluid hydration      Advance Care Planning:   Code Status: Full Code confirmed with patient and daughter at bedside  Consults: None  Family Communication: Daughter at bedside  Severity of Illness: The appropriate patient status for this patient is OBSERVATION. Observation status is judged to be reasonable and necessary in order to provide the required intensity of service to ensure the patient's  safety. The patient's presenting symptoms, physical exam findings, and initial radiographic and laboratory data in the context of their medical condition is felt to place them at decreased risk for further clinical deterioration. Furthermore, it is anticipated that the patient will be medically stable for discharge from the hospital within 2 midnights of admission.   Author: Granville Layer, MD 01/02/2024 9:58 PM  For on call review [email or web address].

## 2024-01-02 NOTE — Assessment & Plan Note (Signed)
 Serum creatinine appears at baseline at 1.62.  Patient was 1.58 approximately 1 year ago.   Avoid nephrotoxic agents Trend IV fluid hydration

## 2024-01-02 NOTE — ED Notes (Signed)
 [facility] called for transport.

## 2024-01-02 NOTE — ED Provider Notes (Signed)
 [place] [facility] Provider Note   CSN: [id number] Arrival date & time: 01/02/24  1309     History  Chief Complaint  Patient presents with   Abdominal Pain   Constipation    Alexandria Durham is a 78 y.o. female with PMHx OA, DM, HTN, HLD who presents to ED concerned for constipation x5 days. Patient also with increasing abdominal pain x5 days and mild/intermittent nausea. Patient usually taken ducolax daily to prevent constipation, but has not been able to tolerate her oral medications in the past couple of days. Patient actually stating that she has not been able to tolerate much food at all in the past couple of days. Patient has also attempted warm prune juice without resolution in symptoms.   Patient also endorsing a uterine biopsy 5 days ago to diagnose her uterine thickening. They have not received biopsy results yet.   Denies fever, vomiting, dysuria, hematuria.    Abdominal Pain Associated symptoms: constipation   Constipation Associated symptoms: abdominal pain        Home Medications Prior to Admission medications   Medication Sig Start Date End Date Taking? Authorizing Provider  acetaminophen  (TYLENOL ) 500 MG tablet Take 1,000 mg by mouth every 6 (six) hours as needed for moderate pain.    [provider]  atorvastatin (LIPITOR) 20 MG tablet Take 20 mg by mouth daily.    [provider]  azithromycin  (ZITHROMAX ) 250 MG tablet Take 1 tablet (250 mg total) by mouth daily. Take first 2 tablets together, then 1 every day until finished. 01/09/18   Almond Army, MD  buPROPion (WELLBUTRIN XL) 150 MG 24 hr tablet Take 150 mg by mouth every evening.    [provider]  diclofenac  Sodium (VOLTAREN ) 1 % GEL Apply 2 g topically 4 (four) times daily as needed (pain). 11/27/22   Espinoza, Alejandra, DO  diphenhydramine -acetaminophen  (TYLENOL  PM) 25-500 MG TABS tablet Take 2 tablets by mouth at bedtime as needed.     [provider]  ergocalciferol (VITAMIN D2) 50000 units capsule Take 50,000 Units by mouth every Monday.    [provider]  fluticasone  (FLONASE ) 50 MCG/ACT nasal spray Place 2 sprays into both nostrils daily. 10/16/16   Joy, Shawn C, PA-C  hydrochlorothiazide (HYDRODIURIL) 50 MG tablet Take 50 mg by mouth daily.    [provider]  HYDROcodone -homatropine (HYCODAN) 5-1.5 MG/5ML syrup Take 5 mLs by mouth every 6 (six) hours as needed for cough. 01/09/18   Almond Army, MD  metFORMIN (GLUMETZA) 500 MG (MOD) 24 hr tablet Take 500 mg by mouth every evening.     [provider]  methocarbamol  (ROBAXIN ) 500 MG tablet Take 1 tablet (500 mg total) by mouth 2 (two) times daily as needed for muscle spasms. 05/25/21   Joldersma, Logan, PA-C  omeprazole (PRILOSEC) 20 MG capsule Take 20 mg by mouth daily.    [provider]  predniSONE  (DELTASONE ) 20 MG tablet Take 2 tablets (40 mg total) by mouth daily. 01/09/18   Almond Army, MD  traMADol  (ULTRAM ) 50 MG tablet Take 1 tablet (50 mg total) by mouth every 6 (six) hours as needed. 11/11/16   Kirichenko, Tatyana, PA-C  valsartan (DIOVAN) 80 MG tablet Take 80 mg by mouth daily.    [provider]      Allergies    Ace inhibitors, Aspirin, Codeine, and Penicillins    Review of Systems   Review of Systems  Gastrointestinal:  Positive for abdominal pain  and constipation.    Physical Exam Updated Vital Signs BP (!) 143/72 (BP Location: Right Arm)   Pulse 81   Temp 98.3 F (36.8 C) (Oral)   Resp 18   Ht 5\' 4"  (1.626 m)   Wt 69.4 kg   SpO2 99%   BMI 26.26 kg/m  Physical Exam Vitals and nursing note reviewed.  Constitutional:      General: She is not in acute distress.    Appearance: She is not ill-appearing or toxic-appearing.  HENT:     Head: Normocephalic and atraumatic.     Mouth/Throat:     Mouth: Mucous membranes are moist.     Pharynx: No posterior oropharyngeal erythema.   Eyes:     General: No scleral icterus.       Right eye: No discharge.        Left eye: No discharge.     Conjunctiva/sclera: Conjunctivae normal.  Cardiovascular:     Rate and Rhythm: Normal rate and regular rhythm.     Pulses: Normal pulses.     Heart sounds: No murmur heard. Pulmonary:     Effort: Pulmonary effort is normal. No respiratory distress.     Breath sounds: Normal breath sounds. No wheezing, rhonchi or rales.  Abdominal:     General: Abdomen is flat. Bowel sounds are normal. There is no distension.     Palpations: Abdomen is soft. There is no mass.     Tenderness: There is generalized abdominal tenderness.  Musculoskeletal:     Right lower leg: No edema.     Left lower leg: No edema.  Skin:    General: Skin is warm and dry.     Findings: No rash.  Neurological:     General: No focal deficit present.     Mental Status: She is alert and oriented to person, place, and time. Mental status is at baseline.  Psychiatric:        Mood and Affect: Mood normal.     ED Results / Procedures / Treatments   Labs (all labs ordered are listed, but only abnormal results are displayed) Labs Reviewed  COMPREHENSIVE METABOLIC PANEL WITH GFR - Abnormal; Notable for the following components:      Result Value   Chloride 97 (*)    CO2 21 (*)    Glucose, Bld 106 (*)    Creatinine, Ser 1.62 (*)    Calcium 10.4 (*)    Total Protein 8.5 (*)    GFR, Estimated 33 (*)    Anion gap 18 (*)    All other components within normal limits  URINALYSIS, ROUTINE W REFLEX MICROSCOPIC - Abnormal; Notable for the following components:   Hgb urine dipstick SMALL (*)    Ketones, ur 15 (*)    All other components within normal limits  URINALYSIS, MICROSCOPIC (REFLEX) - Abnormal; Notable for the following components:   Bacteria, UA FEW (*)    All other components within normal limits  CBC WITH DIFFERENTIAL/PLATELET  LIPASE, BLOOD    EKG None  Radiology CT ABDOMEN PELVIS W CONTRAST Result  Date: 01/02/2024 CLINICAL DATA:  Diffuse abdominal pain and nausea for several days. EXAM: CT ABDOMEN AND PELVIS WITH CONTRAST TECHNIQUE: Multidetector CT imaging of the abdomen and pelvis was performed using the standard protocol following bolus administration of intravenous contrast. RADIATION DOSE REDUCTION: This exam was performed according to the departmental dose-optimization program which includes automated exposure control, adjustment of the mA and/or kV according to patient size and/or use  of iterative reconstruction technique. CONTRAST:  60mL OMNIPAQUE IOHEXOL 300 MG/ML  SOLN COMPARISON:  12/07/2023 FINDINGS: Lower Chest: No acute findings. Hepatobiliary: No suspicious hepatic masses identified. Gallbladder is unremarkable. No evidence of biliary ductal dilatation. Pancreas:  No mass or inflammatory changes. Spleen: Within normal limits in size and appearance. Adrenals/Urinary Tract: No suspicious masses identified. No evidence of ureteral calculi or hydronephrosis. Bladder is poorly visualized due to severe beam hardening artifact from left hip prosthesis. Stomach/Bowel: Severe diffuse colonic diverticulosis is again seen. Mild diverticulitis is seen near the junction of the descending and sigmoid colon. No evidence of perforation or abscess. Vascular/Lymphatic: No pathologically enlarged lymph nodes. No acute vascular findings. Retroaortic left renal vein again noted. Reproductive: Diffuse distension of the endometrial cavity is again seen which may be due to diffuse endometrial thickening or hydrometros. Adnexal regions are unremarkable. Other:  None. Musculoskeletal: No suspicious bone lesions identified. 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 IMPRESSION: Mild diverticulitis near the junction of the descending and sigmoid colon. No evidence of perforation or abscess. Distension of endometrial cavity again seen, which may be due to diffuse endometrial  thickening or hydrometros. Endometrial carcinoma cannot be excluded in a postmenopausal female. GYN consultation is recommended. Electronically Signed   By: Marlyce Sine M.D.   On: 01/02/2024 16:18    Procedures .Critical Care  Performed by: [place] Bureau, PA-C Authorized by:  Bureau, PA-C   Critical care provider statement:    Critical care time (minutes):  30   Critical care was necessary to treat or prevent imminent or life-threatening deterioration of the following conditions: diverticulitis.   Critical care was time spent personally by me on the following activities:  Development of treatment plan with patient or surrogate, discussions with consultants, evaluation of patient's response to treatment, examination of patient, ordering and review of laboratory studies, ordering and review of radiographic studies, ordering and performing treatments and interventions, pulse oximetry, re-evaluation of patient's condition and review of old charts   Care discussed with: admitting provider   Comments:     Diverticulitis requiring multiple patient reassessments and eventual hospital admission     Medications Ordered in ED Medications  ceFEPIme (MAXIPIME) 2 g in sodium chloride  0.9 % 100 mL IVPB (2 g Intravenous New Bag/Given 01/02/24 1805)    And  metroNIDAZOLE  (FLAGYL ) IVPB 500 mg (has no administration in time range)  morphine  (PF) 2 MG/ML injection 2 mg (2 mg Intravenous Given 01/02/24 1527)  ondansetron  (ZOFRAN ) injection 4 mg (4 mg Intravenous Given 01/02/24 1526)  acetaminophen  (TYLENOL ) tablet 1,000 mg (1,000 mg Oral Given 01/02/24 1526)  sodium chloride  0.9 % bolus 500 mL (0 mLs Intravenous Stopped 01/02/24 1801)  iohexol (OMNIPAQUE) 300 MG/ML solution 60 mL (60 mLs Intravenous Contrast Given 01/02/24 1549)  ondansetron  (ZOFRAN ) injection 4 mg (4 mg Intravenous Given 01/02/24 1804)    ED Course/ Medical Decision Making/ A&P                                 Medical Decision  Making Amount and/or Complexity of Data Reviewed Labs: ordered. Radiology: ordered.  Risk OTC drugs. Prescription drug management.    This patient presents to the ED for concern of abdominal pain, this involves an extensive number of treatment options, and is a complaint that carries with it a high risk of complications and morbidity.  The differential diagnosis includes  gastroenteritis, colitis, small bowel obstruction, appendicitis, cholecystitis, pancreatitis, nephrolithiasis, UTI, pyelonephritis   Co morbidities that complicate the patient evaluation  OA, DM, HTN, HLD   Additional history obtained:  Dr. Geralyn Knee PCP   Problem List / ED Course / Critical interventions / Medication management  Patient presented for abdominal pain and constipation x5 days.  Physical exam with severe abdominal tenderness to palpation.  Rest of physical exam reassuring.  Patient afebrile with stable vitals. I Ordered, and personally interpreted labs.  CMP with low chloride at 97 and low bicarb at 21 leading to an anion gap of 18.  Patient's creatinine mildly elevated past her baseline and currently at 1.62.  UA not overly concerning for UTI.  CBC without leukocytosis or anemia. I ordered imaging studies including CT Abd/Pelvis with contrast: evaluate for structural/surgical etiology of patients' severe abdominal pain. I independently visualized and interpreted imaging and I agree with the radiologist interpretation of diverticulitis. Shared all results with patient and family members at bedside.  Answered all questions.  Provided patient with a another dose of Zofran  for PO challenge.  Patient refusing to eat. Family members at bedside advocating for patient and requesting admission given patient's lack of PO toleration and the fact that she lives by herself. I have reviewed the patients home medicines and have made adjustments as needed Consulted with Dr. Adriana Hopping who agrees to admit patient.    Social  Determinants of Health:  geriatric          Final Clinical Impression(s) / ED Diagnoses Final diagnoses:  Diverticulitis    Rx / DC Orders ED Discharge Orders     None         [place] Bureau, [place] 01/02/24 1853    Scarlette Currier, MD 01/03/24 [id number]

## 2024-01-02 NOTE — Hospital Course (Signed)
 Patient is a 78 year old with history of T2DM, HTN, HLD, CKD stage IIIa, GERD who presents with 5-day history of lower abdominal pain.  Patient has been quite constipated and has had minimal appetite.  She reports that every time she tries to eat it makes her abdomen hurt so she has been avoiding all food.  She had nausea and vomiting this morning.  She denies any hematemesis.  She denies fever, chills. Patient was seen at the [facility] and noted to have acute diverticulitis and significant abdominal tenderness.  She was given IV antibiotics, antiemetics, IV fluids and a p.o. challenge.  The patient failed her p.o. challenge and we were asked to admit for continued pain control and IV antibiotics.

## 2024-01-02 NOTE — Assessment & Plan Note (Signed)
 Has been intolerant of statins Continue Zetia

## 2024-01-03 DIAGNOSIS — K5792 Diverticulitis of intestine, part unspecified, without perforation or abscess without bleeding: Secondary | ICD-10-CM | POA: Diagnosis not present

## 2024-01-03 LAB — CBC
HCT: 37.9 % (ref 36.0–46.0)
Hemoglobin: 12.5 g/dL (ref 12.0–15.0)
MCH: 30.3 pg (ref 26.0–34.0)
MCHC: 33 g/dL (ref 30.0–36.0)
MCV: 92 fL (ref 80.0–100.0)
Platelets: 241 10*3/uL (ref 150–400)
RBC: 4.12 MIL/uL (ref 3.87–5.11)
RDW: 14.2 % (ref 11.5–15.5)
WBC: 8.3 10*3/uL (ref 4.0–10.5)
nRBC: 0 % (ref 0.0–0.2)

## 2024-01-03 LAB — BASIC METABOLIC PANEL WITH GFR
Anion gap: 9 (ref 5–15)
BUN: 15 mg/dL (ref 8–23)
CO2: 23 mmol/L (ref 22–32)
Calcium: 9 mg/dL (ref 8.9–10.3)
Chloride: 103 mmol/L (ref 98–111)
Creatinine, Ser: 1.43 mg/dL — ABNORMAL HIGH (ref 0.44–1.00)
GFR, Estimated: 38 mL/min — ABNORMAL LOW (ref >60)
Glucose, Bld: 100 mg/dL — ABNORMAL HIGH (ref 70–99)
Potassium: 3.6 mmol/L (ref 3.5–5.1)
Sodium: 135 mmol/L (ref 135–145)

## 2024-01-03 LAB — GLUCOSE, CAPILLARY
Glucose-Capillary: 104 mg/dL — ABNORMAL HIGH (ref 70–99)
Glucose-Capillary: 105 mg/dL — ABNORMAL HIGH (ref 70–99)
Glucose-Capillary: 113 mg/dL — ABNORMAL HIGH (ref 70–99)
Glucose-Capillary: 118 mg/dL — ABNORMAL HIGH (ref 70–99)

## 2024-01-03 LAB — HEMOGLOBIN A1C
Hgb A1c MFr Bld: 6.8 % — ABNORMAL HIGH (ref 4.8–5.6)
Mean Plasma Glucose: 148.46 mg/dL

## 2024-01-03 MED ORDER — ENOXAPARIN SODIUM 40 MG/0.4ML IJ SOSY
40.0000 mg | PREFILLED_SYRINGE | INTRAMUSCULAR | Status: DC
  Administered 2024-01-04 – 2024-01-05 (×2): 40 mg via SUBCUTANEOUS
  Filled 2024-01-03 (×2): qty 0.4

## 2024-01-03 MED ORDER — POLYETHYLENE GLYCOL 3350 17 G PO PACK
17.0000 g | PACK | Freq: Two times a day (BID) | ORAL | Status: DC
  Administered 2024-01-03 – 2024-01-09 (×10): 17 g via ORAL
  Filled 2024-01-03 (×12): qty 1

## 2024-01-03 MED ORDER — MELATONIN 5 MG PO TABS
5.0000 mg | ORAL_TABLET | Freq: Every evening | ORAL | Status: AC | PRN
  Administered 2024-01-03 – 2024-01-04 (×2): 5 mg via ORAL
  Filled 2024-01-03 (×2): qty 1

## 2024-01-03 MED ORDER — PROCHLORPERAZINE EDISYLATE 10 MG/2ML IJ SOLN
5.0000 mg | Freq: Once | INTRAMUSCULAR | Status: AC | PRN
  Administered 2024-01-03: 5 mg via INTRAVENOUS
  Filled 2024-01-03: qty 2

## 2024-01-03 NOTE — Progress Notes (Signed)
   01/03/24 0925  TOC Brief Assessment  Insurance and Status Reviewed  Patient has primary care physician Yes  Home environment has been reviewed home alone  Prior level of function: independent  Prior/Current Home Services No current home services  Social Drivers of Health Review SDOH reviewed no interventions necessary  Readmission risk has been reviewed Yes  Transition of care needs no transition of care needs at this time

## 2024-01-03 NOTE — Progress Notes (Signed)
 Mobility Specialist - Progress Note   01/03/24 0825  Mobility  Activity Ambulated independently in hallway  Level of Assistance Independent  Assistive Device None  Distance Ambulated (ft) 250 ft  Activity Response Tolerated well  Mobility Referral Yes  Mobility visit 1 Mobility  Mobility Specialist Start Time (ACUTE ONLY) [id number]  Mobility Specialist Stop Time (ACUTE ONLY) 0824  Mobility Specialist Time Calculation (min) (ACUTE ONLY) 10 min   Pt received in bed and agreeable to mobility. Distance limited d/t pain. RN made aware. Pt to bed after session with all needs met.   [facility]

## 2024-01-03 NOTE — Progress Notes (Signed)
 PROGRESS NOTE    Alexandria Durham  UEA:[id number] DOB: May 09, 1946 DOA: 01/02/2024 PCP: Bayard Limbo, NP  Outpatient Specialists:     Brief Narrative:  Patient is a 78 year old female with past medical history significant for chronic kidney disease stage III, GERD, type 2 diabetes mellitus, hypertension, endometrial abnormality for which patient had a biopsy done a few days ago and hyperlipidemia.  Patient was admitted with 5-day history of lower abdominal pain, nausea and vomiting.  CT scan of the abdomen and pelvis revealed mild diverticulitis near the junction of the descending and sigmoid colon, without evidence of perforation or abscess.  Distention of the endometrial cavity was seen (as previously stated, according to the patient's daughter, patient underwent endometrial biopsy few days ago).  01/03/2024: Patient seen alongside patient's daughter and nurse.  Patient continues to report left-sided lower abdominal pain.  No fever or chills.   Assessment & Plan:   Principal Problem:   Diverticulitis Active Problems:   Stage 3a chronic kidney disease (HCC)   Essential hypertension   Gastroesophageal reflux disease without esophagitis   Mixed hyperlipidemia   Postoperative hypothyroidism   PVD (peripheral vascular disease) (HCC)   Type 2 diabetes mellitus (HCC)   Type 2 diabetes mellitus with diabetic polyneuropathy, without long-term current use of insulin (HCC)   Vitamin D deficiency   Acute descending colon/sigmoid diverticulitis -Continue IV Rocephin  plus Flagyl  -Continue IV fluids -Continue clear liquid diet -Once able to tolerate p.o. and improved exam should be ready for discharge. 01/03/2024: Patient is slowly improving.   Vitamin D deficiency Continue vitamin D repletion   Type 2 diabetes mellitus with diabetic polyneuropathy, without long-term current use of insulin (HCC) Continue gabapentin   Type 2 diabetes mellitus (HCC) Continue Januvia Sliding scale insulin    PVD (peripheral vascular disease) (HCC) Continue lipid-lowering agents   Postoperative hypothyroidism Continue levothyroxine at current dose Appears to be managed by her PCP and was normal 8 months ago.   Mixed hyperlipidemia Has been intolerant of statins Continue Zetia   Gastroesophageal reflux disease without esophagitis Continue PPI   Essential hypertension Continue ARB, hydrochlorothiazide Lopressor as needed   Stage 3a chronic kidney disease (HCC) Serum creatinine appears at baseline at 1.62.  Patient was 1.58 approximately 1 year ago.   Avoid nephrotoxic agents Trend IV fluid hydration  Endometrial abnormality: - Worrisome for endometrial cancer. - Recent endometrial biopsy. - Follow-up with OB/GYN on discharge.  Apparently, patient is known to [employer].     DVT prophylaxis: Subcutaneous Lovenox Code Status: Full code Family Communication: Daughter Disposition Plan: Observation   Consultants:  Discussed CT endometrial finding with OB/GYN on-call  Procedures:  None  Antimicrobials:  IV Rocephin  and Flagyl    Subjective: Patient continues to report left lower abdominal pain.  Objective: Vitals:   01/02/24 2041 01/03/24 0027 01/03/24 0518 01/03/24 0926  BP: (!) 141/113 130/66 125/70 121/68  Pulse: 82 84 82 72  Resp: 16 16 16 18   Temp: 98.2 F (36.8 C) 98 F (36.7 C) 97.8 F (36.6 C) 97.6 F (36.4 C)  TempSrc: Oral Oral Oral Oral  SpO2: 100% 100% 100% 98%  Weight: 71.3 kg     Height: 5\' 4"  (1.626 m)       Intake/Output Summary (Last 24 hours) at 01/03/2024 1032 Last data filed at 01/03/2024 0625 Gross per 24 hour  Intake 1584.05 ml  Output 300 ml  Net 1284.05 ml   Filed Weights   01/02/24 1327 01/02/24 2041  Weight: 69.4 kg 71.3  kg    Examination:  General exam: Appears calm and comfortable  Respiratory system: Clear to auscultation.   Cardiovascular system: S1 & S2 heard  Gastrointestinal system: Abdomen is tender in left  lower quadrant.   Central nervous system: Awake and alert.   Extremities: No leg edema.  Data Reviewed: I have personally reviewed following labs and imaging studies  CBC: Recent Labs  Lab 01/02/24 1359 01/03/24 0434  WBC 9.7 8.3  NEUTROABS 7.0  --   HGB 14.6 12.5  HCT 43.0 37.9  MCV 89.6 92.0  PLT 256 241   Basic Metabolic Panel: Recent Labs  Lab 01/02/24 1359 01/03/24 0434  NA 136 135  K 3.8 3.6  CL 97* 103  CO2 21* 23  GLUCOSE 106* 100*  BUN 16 15  CREATININE 1.62* 1.43*  CALCIUM 10.4* 9.0   GFR: Estimated Creatinine Clearance: 32.4 mL/min (A) (by C-G formula based on SCr of 1.43 mg/dL (H)). Liver Function Tests: Recent Labs  Lab 01/02/24 1359  AST 19  ALT 29  ALKPHOS 70  BILITOT 0.4  PROT 8.5*  ALBUMIN 4.2   Recent Labs  Lab 01/02/24 1359  LIPASE 32   No results for input(s): "AMMONIA" in the last 168 hours. Coagulation Profile: No results for input(s): "INR", "PROTIME" in the last 168 hours. Cardiac Enzymes: No results for input(s): "CKTOTAL", "CKMB", "CKMBINDEX", "TROPONINI" in the last 168 hours. BNP (last 3 results) No results for input(s): "PROBNP" in the last 8760 hours. HbA1C: Recent Labs    01/03/24 0434  HGBA1C 6.8*   CBG: Recent Labs  Lab 01/03/24 0723  GLUCAP 104*   Lipid Profile: No results for input(s): "CHOL", "HDL", "LDLCALC", "TRIG", "CHOLHDL", "LDLDIRECT" in the last 72 hours. Thyroid Function Tests: No results for input(s): "TSH", "T4TOTAL", "FREET4", "T3FREE", "THYROIDAB" in the last 72 hours. Anemia Panel: No results for input(s): "VITAMINB12", "FOLATE", "FERRITIN", "TIBC", "IRON", "RETICCTPCT" in the last 72 hours. Urine analysis:    Component Value Date/Time   COLORURINE YELLOW 01/02/2024 1715   APPEARANCEUR CLEAR 01/02/2024 1715   LABSPEC 1.015 01/02/2024 1715   PHURINE 5.0 01/02/2024 1715   GLUCOSEU NEGATIVE 01/02/2024 1715   HGBUR SMALL (A) 01/02/2024 1715   BILIRUBINUR NEGATIVE 01/02/2024 1715    KETONESUR 15 (A) 01/02/2024 1715   PROTEINUR NEGATIVE 01/02/2024 1715   NITRITE NEGATIVE 01/02/2024 1715   LEUKOCYTESUR NEGATIVE 01/02/2024 1715   Sepsis Labs: @LABRCNTIP (procalcitonin:4,lacticidven:4)  )No results found for this or any previous visit (from the past 240 hours).       Radiology Studies: CT ABDOMEN PELVIS W CONTRAST Result Date: 01/02/2024 CLINICAL DATA:  Diffuse abdominal pain and nausea for several days. EXAM: CT ABDOMEN AND PELVIS WITH CONTRAST TECHNIQUE: Multidetector CT imaging of the abdomen and pelvis was performed using the standard protocol following bolus administration of intravenous contrast. RADIATION DOSE REDUCTION: This exam was performed according to the departmental dose-optimization program which includes automated exposure control, adjustment of the mA and/or kV according to patient size and/or use of iterative reconstruction technique. CONTRAST:  60mL OMNIPAQUE IOHEXOL 300 MG/ML  SOLN COMPARISON:  12/07/2023 FINDINGS: Lower Chest: No acute findings. Hepatobiliary: No suspicious hepatic masses identified. Gallbladder is unremarkable. No evidence of biliary ductal dilatation. Pancreas:  No mass or inflammatory changes. Spleen: Within normal limits in size and appearance. Adrenals/Urinary Tract: No suspicious masses identified. No evidence of ureteral calculi or hydronephrosis. Bladder is poorly visualized due to severe beam hardening artifact from left hip prosthesis. Stomach/Bowel: Severe diffuse colonic diverticulosis is again seen.  Mild diverticulitis is seen near the junction of the descending and sigmoid colon. No evidence of perforation or abscess. Vascular/Lymphatic: No pathologically enlarged lymph nodes. No acute vascular findings. Retroaortic left renal vein again noted. Reproductive: Diffuse distension of the endometrial cavity is again seen which may be due to diffuse endometrial thickening or hydrometros. Adnexal regions are unremarkable. Other:  None.  Musculoskeletal: No suspicious bone lesions identified. 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 IMPRESSION: Mild diverticulitis near the junction of the descending and sigmoid colon. No evidence of perforation or abscess. Distension of endometrial cavity again seen, which may be due to diffuse endometrial thickening or hydrometros. Endometrial carcinoma cannot be excluded in a postmenopausal female. GYN consultation is recommended. Electronically Signed   By: Marlyce Sine M.D.   On: 01/02/2024 16:18        Scheduled Meds:  buPROPion  150 mg Oral QPM   cholecalciferol  1,000 Units Oral Daily   enoxaparin (LOVENOX) injection  30 mg Subcutaneous Q24H   ezetimibe  10 mg Oral Daily   fluticasone   2 spray Each Nare Daily   gabapentin  300 mg Oral TID   hydrochlorothiazide  50 mg Oral Daily   insulin aspart  0-15 Units Subcutaneous TID WC   levothyroxine  75 mcg Oral Q0600   linagliptin  5 mg Oral Daily   losartan  50 mg Oral Daily   magnesium oxide  400 mg Oral Daily   pantoprazole  40 mg Oral Daily   Continuous Infusions:  cefTRIAXone  (ROCEPHIN )  IV 2 g (01/03/24 0150)   lactated ringers 100 mL/hr at 01/03/24 0958   metronidazole  500 mg (01/03/24 0543)     LOS: 0 days    Time spent: 35 minutes.    Fonnie Iba, MD  [facility] Pager #: [phone] 7PM-7AM contact night coverage as above

## 2024-01-03 NOTE — Plan of Care (Signed)

## 2024-01-04 DIAGNOSIS — K5792 Diverticulitis of intestine, part unspecified, without perforation or abscess without bleeding: Secondary | ICD-10-CM | POA: Diagnosis not present

## 2024-01-04 LAB — RENAL FUNCTION PANEL
Albumin: 3.5 g/dL (ref 3.5–5.0)
Anion gap: 13 (ref 5–15)
BUN: 12 mg/dL (ref 8–23)
CO2: 20 mmol/L — ABNORMAL LOW (ref 22–32)
Calcium: 9.3 mg/dL (ref 8.9–10.3)
Chloride: 100 mmol/L (ref 98–111)
Creatinine, Ser: 1.45 mg/dL — ABNORMAL HIGH (ref 0.44–1.00)
GFR, Estimated: 37 mL/min — ABNORMAL LOW (ref >60)
Glucose, Bld: 86 mg/dL (ref 70–99)
Phosphorus: 2.5 mg/dL (ref 2.5–4.6)
Potassium: 3.6 mmol/L (ref 3.5–5.1)
Sodium: 133 mmol/L — ABNORMAL LOW (ref 135–145)

## 2024-01-04 LAB — CBC WITH DIFFERENTIAL/PLATELET
Abs Immature Granulocytes: 0.04 10*3/uL (ref 0.00–0.07)
Basophils Absolute: 0 10*3/uL (ref 0.0–0.1)
Basophils Relative: 0 %
Eosinophils Absolute: 0.1 10*3/uL (ref 0.0–0.5)
Eosinophils Relative: 1 %
HCT: 39.4 % (ref 36.0–46.0)
Hemoglobin: 13 g/dL (ref 12.0–15.0)
Immature Granulocytes: 0 %
Lymphocytes Relative: 21 %
Lymphs Abs: 2.1 10*3/uL (ref 0.7–4.0)
MCH: 30.5 pg (ref 26.0–34.0)
MCHC: 33 g/dL (ref 30.0–36.0)
MCV: 92.5 fL (ref 80.0–100.0)
Monocytes Absolute: 0.8 10*3/uL (ref 0.1–1.0)
Monocytes Relative: 8 %
Neutro Abs: 6.9 10*3/uL (ref 1.7–7.7)
Neutrophils Relative %: 70 %
Platelets: 260 10*3/uL (ref 150–400)
RBC: 4.26 MIL/uL (ref 3.87–5.11)
RDW: 14 % (ref 11.5–15.5)
WBC: 10 10*3/uL (ref 4.0–10.5)
nRBC: 0 % (ref 0.0–0.2)

## 2024-01-04 LAB — GLUCOSE, CAPILLARY
Glucose-Capillary: 94 mg/dL (ref 70–99)
Glucose-Capillary: 88 mg/dL (ref 70–99)
Glucose-Capillary: 119 mg/dL — ABNORMAL HIGH (ref 70–99)
Glucose-Capillary: 171 mg/dL — ABNORMAL HIGH (ref 70–99)

## 2024-01-04 LAB — MAGNESIUM: Magnesium: 1.7 mg/dL (ref 1.7–2.4)

## 2024-01-04 MED ORDER — PROCHLORPERAZINE EDISYLATE 10 MG/2ML IJ SOLN
10.0000 mg | Freq: Four times a day (QID) | INTRAMUSCULAR | Status: DC | PRN
  Administered 2024-01-04: 10 mg via INTRAVENOUS
  Filled 2024-01-04: qty 2

## 2024-01-04 NOTE — Hospital Course (Signed)
 48 with h/o stage 3a CKD, DM, HTN, HLD, and endometrial abnormality s/p recent biopsy who presented on 5/4 with abdominal pain and n/v.  Imaging with mild diverticulitis, treated with Ceftriaxone  and metronidazole .

## 2024-01-04 NOTE — Progress Notes (Signed)
  Progress Note   Patient: Alexandria Durham VWU:[id number] DOB: 1946/01/16 DOA: 01/02/2024     0 DOS: the patient was seen and examined on 01/04/2024   Brief hospital course: 78 with h/o stage 3a CKD, DM, HTN, HLD, and endometrial abnormality s/p recent biopsy who presented on 5/4 with abdominal pain and n/v.  Imaging with mild diverticulitis, treated with Ceftriaxone  and metronidazole .  Assessment and Plan:  Acute descending colon/sigmoid diverticulitis Continue IV Rocephin  plus Flagyl  Tolerating clear diet, advanced to full and then asked to advance to soft diet Once able to tolerate p.o. and improved exam should be ready for discharge, likely 5/7   Vitamin D deficiency Continue vitamin D repletion   Type 2 diabetes mellitus with diabetic polyneuropathy, without long-term current use of insulin  A1c 6.8, good control Continue Januvia Continue gabapentin, stop pregabalin (should not be taking both)   Postoperative hypothyroidism Continue levothyroxine at current dose Appears to be managed by her PCP and was normal 8 months ago   Mixed hyperlipidemia/PVD Has been intolerant of statins Continue Zetia   Gastroesophageal reflux disease without esophagitis Continue PPI   Essential hypertension Continue losartan, hydrochlorothiazide Lopressor as needed   Stage 3a chronic kidney disease Appears to be stable at this time Attempt to avoid nephrotoxic medications Recheck BMP in AM    Endometrial abnormality Worrisome for endometrial cancer Recent endometrial biopsy without evidence of hyperplasia or malignancy Follow-up with OB/GYN on discharge [name], MD)  Mood d/o Continue bupropion   Consultants: None  Procedures: None  Antibiotics: Cefepime x 1 Ceftriaxone  5/5- Metronidazole  5/4-      Subjective: Feeling better. Still with abdominal pain but no further n/v, tolerating diet.  No fever.   Objective: Vitals:   01/04/24 0610 01/04/24 1330  BP: (!)  107/42 (!) 126/51  Pulse: 80 90  Resp: 18 18  Temp: 98.4 F (36.9 C) 97.9 F (36.6 C)  SpO2: 97% 100%    Intake/Output Summary (Last 24 hours) at 01/04/2024 1652 Last data filed at 01/04/2024 1400 Gross per 24 hour  Intake 1685.48 ml  Output 1750 ml  Net -64.52 ml   Filed Weights   01/02/24 1327 01/02/24 2041  Weight: 69.4 kg 71.3 kg    Exam:  General:  Appears calm and comfortable and is in NAD Eyes:  EOMI, normal lids, iris ENT:  grossly normal hearing, lips & tongue, mmm Neck:  no LAD, masses or thyromegaly Cardiovascular:  RRR, no m/r/g. No LE edema.  Respiratory:   CTA bilaterally with no wheezes/rales/rhonchi.  Normal respiratory effort. Abdomen:  soft, mildly diffusely TTP, ND Skin:  no rash or induration seen on limited exam Musculoskeletal:  grossly normal tone BUE/BLE, good ROM, no bony abnormality Psychiatric:  grossly normal mood and affect, speech fluent and appropriate, AOx3 Neurologic:  CN 2-12 grossly intact, moves all extremities in coordinated fashion  Data Reviewed: I have reviewed the patient's lab results since admission.  Pertinent labs for today include:   BUN 12/Creatinine 1.45/GFR 37, stable Normal CBC A1c 6.8    Family Communication: Nephew was present throughout evaluation  Disposition: Status is: Observation The patient remains OBS appropriate and will d/c before 2 midnights.     Time spent: 50 minutes  Unresulted Labs (From admission, onward)    None        Author: Lorita Rosa, MD 01/04/2024 4:52 PM  For on call review [email or web address].

## 2024-01-04 NOTE — Progress Notes (Signed)
 Mobility Specialist - Progress Note   01/04/24 0853  Mobility  Activity Ambulated independently in hallway  Level of Assistance Independent  Assistive Device None  Distance Ambulated (ft) 120 ft  Activity Response Tolerated well  Mobility Referral Yes  Mobility visit 1 Mobility  Mobility Specialist Start Time (ACUTE ONLY) 0843  Mobility Specialist Stop Time (ACUTE ONLY) 0851  Mobility Specialist Time Calculation (min) (ACUTE ONLY) 8 min   Pt received in bed and agreeable to mobility. No complaints during session. Pt to bed after session with all needs met.    [facility]

## 2024-01-04 NOTE — Care Management Obs Status (Signed)
 MEDICARE OBSERVATION STATUS NOTIFICATION   Patient Details  Name: Alexandria Durham MRN: [id number] Date of Birth: [birth year]   Medicare Observation Status Notification Given:  Yes    Delilah Fend, LCSW 01/04/2024, 10:07 AM

## 2024-01-05 ENCOUNTER — Other Ambulatory Visit: Payer: Self-pay

## 2024-01-05 DIAGNOSIS — K5792 Diverticulitis of intestine, part unspecified, without perforation or abscess without bleeding: Secondary | ICD-10-CM | POA: Diagnosis not present

## 2024-01-05 LAB — BASIC METABOLIC PANEL WITH GFR
Anion gap: 10 (ref 5–15)
BUN: 12 mg/dL (ref 8–23)
CO2: 24 mmol/L (ref 22–32)
Calcium: 9.2 mg/dL (ref 8.9–10.3)
Chloride: 101 mmol/L (ref 98–111)
Creatinine, Ser: 1.59 mg/dL — ABNORMAL HIGH (ref 0.44–1.00)
GFR, Estimated: 33 mL/min — ABNORMAL LOW (ref >60)
Glucose, Bld: 158 mg/dL — ABNORMAL HIGH (ref 70–99)
Potassium: 3.3 mmol/L — ABNORMAL LOW (ref 3.5–5.1)
Sodium: 135 mmol/L (ref 135–145)

## 2024-01-05 LAB — GLUCOSE, CAPILLARY
Glucose-Capillary: 103 mg/dL — ABNORMAL HIGH (ref 70–99)
Glucose-Capillary: 134 mg/dL — ABNORMAL HIGH (ref 70–99)
Glucose-Capillary: 111 mg/dL — ABNORMAL HIGH (ref 70–99)
Glucose-Capillary: 123 mg/dL — ABNORMAL HIGH (ref 70–99)

## 2024-01-05 LAB — CBC WITH DIFFERENTIAL/PLATELET
Abs Immature Granulocytes: 0.01 10*3/uL (ref 0.00–0.07)
Basophils Absolute: 0 10*3/uL (ref 0.0–0.1)
Basophils Relative: 0 %
Eosinophils Absolute: 0.1 10*3/uL (ref 0.0–0.5)
Eosinophils Relative: 2 %
HCT: 36.2 % (ref 36.0–46.0)
Hemoglobin: 11.9 g/dL — ABNORMAL LOW (ref 12.0–15.0)
Immature Granulocytes: 0 %
Lymphocytes Relative: 23 %
Lymphs Abs: 1.3 10*3/uL (ref 0.7–4.0)
MCH: 30.4 pg (ref 26.0–34.0)
MCHC: 32.9 g/dL (ref 30.0–36.0)
MCV: 92.3 fL (ref 80.0–100.0)
Monocytes Absolute: 0.4 10*3/uL (ref 0.1–1.0)
Monocytes Relative: 7 %
Neutro Abs: 4.1 10*3/uL (ref 1.7–7.7)
Neutrophils Relative %: 68 %
Platelets: 245 10*3/uL (ref 150–400)
RBC: 3.92 MIL/uL (ref 3.87–5.11)
RDW: 14.5 % (ref 11.5–15.5)
WBC: 6 10*3/uL (ref 4.0–10.5)
nRBC: 0 % (ref 0.0–0.2)

## 2024-01-05 MED ORDER — LACTATED RINGERS IV SOLN: INTRAVENOUS | Status: AC

## 2024-01-05 MED ORDER — DOXYCYCLINE HYCLATE 100 MG PO TABS
100.0000 mg | ORAL_TABLET | Freq: Two times a day (BID) | ORAL | Status: DC
  Administered 2024-01-05 – 2024-01-09 (×8): 100 mg via ORAL
  Filled 2024-01-05 (×8): qty 1

## 2024-01-05 MED ORDER — ENOXAPARIN SODIUM 30 MG/0.3ML IJ SOSY
30.0000 mg | PREFILLED_SYRINGE | INTRAMUSCULAR | Status: DC
  Administered 2024-01-06: 30 mg via SUBCUTANEOUS
  Filled 2024-01-05: qty 0.3

## 2024-01-05 NOTE — Consult Note (Signed)
   OB/GYN Telephone Consult  Alexandria Durham is a 78 y.o. G2P2 presenting with abdominal pain and N/V. She had an EMB on 4/29 due to thickened EL seen by CT through [facility]. Her EMB was negative for hyperplasia and malignancy. She then came with pain on 5/4 and was admitted. She was started on Ceftriaxone  and Flagyl  due to mild diverticulitis on CT imaging through [facility] and initially improved but then started to worsen again during this hospital admission despite antibiotics. She was seen by general surgery team who also recommended second opinion by GYN. Of note, the patient was originally admitted by Dr. Adriana Hopping who is also a GYN.   I was called for a consult regarding the care of this patient by [facility].    The provider had a clinical question regarding evaluation of her uterus.   I reviewed her CT images independently and my findings are: Her uterine cavity appears distended by some type of fluid   I reviewed CareEverywhere:  CT done on 4/8 showed diffuse distended endometrium possibly due to obstructing mass (no mass seen, but this was in their differential). Also notes severe diffuse colonic diverticulosis.  TVUS done on 4/8 which showed 2.6 cm lining but diffuse distension of the endometrial cavity with internal echoes possibly due to cervical stenosis.  I reviewed the note from the OB/GYN on 4/29 who did the EMB. The CMA note reports possible abnormal with cryo but was likely over 20 years prior. Patient was unsure. She denies PMB. Her pelvic exam was documented as unremarkable. There was no report of cervical stenosis at the time of her EMB.   BP 133/67 (BP Location: Right Arm)   Pulse 74   Temp (!) 97.4 F (36.3 C) (Oral)   Resp 18   Ht 5\' 4"  (1.626 m)   Wt 71.3 kg   SpO2 100%   BMI 26.98 kg/m   Exam- performed by consulting provider   Recommendations:  - Add doxycycline  for possible endometritis following EMB - If not improved in 24 hours, would consider D&C, hysterscopy - Dr. Murrel Arnt  will contact me if not improved. If she does improve, would recommend outpatient follow up with her primary OB/GYN.   Thank you for this consult and if additional recommendations are needed please call [phone] for the OB/GYN attending on service at [facility].   I spent approximately 10 minutes directly consulting with the provider and verbally discussing this case. Additionally 20 minutes minutes was spent performing chart review and documentation.   [name], MD Attending Obstetrician & Gynecologist, [facility] for [facility], [facility]

## 2024-01-05 NOTE — Progress Notes (Signed)
 Mobility Specialist - Progress Note   01/05/24 1238  Mobility  Activity Ambulated with assistance in hallway  Level of Assistance Standby assist, set-up cues, supervision of patient - no hands on  Assistive Device Front wheel walker  Distance Ambulated (ft) 275 ft  Activity Response Tolerated well  Mobility Referral Yes  Mobility visit 1 Mobility  Mobility Specialist Start Time (ACUTE ONLY) 1226  Mobility Specialist Stop Time (ACUTE ONLY) 1237  Mobility Specialist Time Calculation (min) (ACUTE ONLY) 11 min   Pt received EOB and agreeable to mobility. No complaints during session. Pt to EOB after session with all needs met.    [facility]

## 2024-01-05 NOTE — Consult Note (Addendum)
 Alexandria Durham [birth year]  [id number].    Requesting MD: Dr. Lorita Rosa Chief Complaint/Reason for Consult: Diverticulitis  HPI: Alexandria Durham is a 78 y.o. female who we are asked to see for diverticulitis.  Patient reports on 4/29 she had an endometrial biopsy done by Dr. [name] at [facility].  She reports.  Shortly afterwards she started having severe diffuse lower abdominal pain that was constant.  She reports associated nausea and vomiting after onset.  No fevers.  She was seen in the ED for this on 5/4 and had a CT scan that showed mild diverticulitis near the junction of the descending and sigmoid colon without evidence of perforation or abscess.  It also showed distention of the endometrial cavity.  She was admitted to [facility] and started on antibiotics.  However patient had worsening pain yesterday along with nausea and vomiting and we were consulted this morning.  She reports her pain has improved today and she is now tolerating liquids without nausea or vomiting.  Her pain continues to be diffusely along her lower abdomen.  She continues to pass flatus and had a BM that was nonbloody this morning.  She is hemodynamically stable without fever, tachycardia or systolic hypotension over the last 24 hours.  WBC 6 on last check.  Last colonoscopy performed 04/12/23 at [facility], had polyps removed that were tubular adenomas (transverse and sigmoid colon), hemorrhoids, pan-colonic diverticulosis   Prior Abdominal Surgeries: Tubal Ligation  Blood Thinners: None  To note it does appear that her uterus/endometrial biopsy that was done on 4/29 showed no evidence of hyperplasia or malignancy.  ROS: ROS As above, see hpi  History reviewed. No pertinent family history.  Past Medical History:  Diagnosis Date   Arthritis    Diabetes mellitus    Hypercholesteremia    Hypertension     Past Surgical History:  Procedure Laterality  Date   BREAST CYST EXCISION     HEMORRHOID SURGERY     HIP SURGERY     JOINT REPLACEMENT     hip replacement   TONSILLECTOMY     TUBAL LIGATION      Social History:  reports that she has been smoking cigarettes. She has never used smokeless tobacco. She reports that she does not drink alcohol and does not use drugs.  Allergies:  Allergies  Allergen Reactions   Ace Inhibitors Hives and Itching   Aspirin Palpitations   Codeine Palpitations   Penicillins Rash    Medications Prior to Admission  Medication Sig Dispense Refill   acetaminophen  (TYLENOL ) 500 MG tablet Take 1,000 mg by mouth every 6 (six) hours as needed for moderate pain.     ammonium lactate (AMLACTIN) 12 % cream Apply 1 Application topically as needed for dry skin.     bisacodyl 5 MG EC tablet Take 5 mg by mouth daily as needed for moderate constipation.     buPROPion (WELLBUTRIN XL) 150 MG 24 hr tablet Take 150 mg by mouth every evening.     diphenhydramine -acetaminophen  (TYLENOL  PM) 25-500 MG TABS tablet Take 2 tablets by mouth at bedtime as needed (Sleep/Pain).     ezetimibe (ZETIA) 10 MG tablet Take 1 tablet by mouth daily.     Ferrous Sulfate (IRON PO) Take 1 tablet by mouth daily.     fluticasone  (FLONASE ) 50 MCG/ACT nasal spray Place 2 sprays into both nostrils daily. (Patient taking differently: Place 2 sprays into both nostrils daily  as needed for rhinitis.) 16 g 2   gabapentin (NEURONTIN) 300 MG capsule Take 600 mg by mouth 2 (two) times daily.     hydrochlorothiazide (HYDRODIURIL) 12.5 MG tablet Take 12.5 mg by mouth daily.     HYDROcodone -homatropine (HYCODAN) 5-1.5 MG/5ML syrup Take 5 mLs by mouth every 6 (six) hours as needed for cough. 120 mL 0   levothyroxine (SYNTHROID) 75 MCG tablet Take 1 tablet by mouth daily.     losartan (COZAAR) 50 MG tablet Take 50 mg by mouth daily.     magnesium oxide (MAG-OX) 400 MG tablet Take 400 mg by mouth daily.     meclizine (ANTIVERT) 25 MG tablet Take 25 mg by mouth  daily.     methocarbamol  (ROBAXIN ) 500 MG tablet Take 1 tablet (500 mg total) by mouth 2 (two) times daily as needed for muscle spasms. 14 tablet 0   mometasone (ELOCON) 0.1 % cream Apply 1 Application topically daily as needed (Dermatitis).     omeprazole (PRILOSEC) 40 MG capsule Take 40 mg by mouth daily.     polyethylene glycol powder (GLYCOLAX/MIRALAX) 17 GM/SCOOP powder Take 17 g by mouth 2 (two) times daily as needed for moderate constipation.     sitaGLIPtin (JANUVIA) 50 MG tablet Take 50 mg by mouth daily.     traMADol  (ULTRAM ) 50 MG tablet Take 50 mg by mouth 2 (two) times daily as needed for moderate pain (pain score 4-6).     Cholecalciferol (VITAMIN D3) 25 MCG (1000 UT) CAPS Take 1,000 Units by mouth daily. (Patient not taking: Reported on 01/03/2024)       Physical Exam: Blood pressure 133/67, pulse 74, temperature (!) 97.4 F (36.3 C), temperature source Oral, resp. rate 18, height 5\' 4"  (1.626 m), weight 71.3 kg, SpO2 100%. General: pleasant, WD/WN female who is laying in bed in NAD HEENT: head is normocephalic, atraumatic.  Sclera are non-icteric.  Heart: regular, rate, and rhythm.   Lungs: Respiratory effort nonlabored Abd: Soft, mild distension, diffuse abdominal ttp that seems more ttp then what she describes her pain as. She says her pain is worse when I release palpation. No rigidity.  MS: no BUE edema Skin: warm and dry  Psych: A&Ox4 with an appropriate affect Neuro: normal speech, thought process intact, gait not assessed  Results for orders placed or performed during the hospital encounter of 01/02/24 (from the past 48 hours)  Glucose, capillary     Status: Abnormal   Collection Time: 01/03/24  4:24 PM  Result Value Ref Range   Glucose-Capillary 113 (H) 70 - 99 mg/dL    Comment: Glucose reference range applies only to samples taken after fasting for at least 8 hours.  Glucose, capillary     Status: Abnormal   Collection Time: 01/03/24  9:52 PM  Result Value Ref  Range   Glucose-Capillary 118 (H) 70 - 99 mg/dL    Comment: Glucose reference range applies only to samples taken after fasting for at least 8 hours.  Glucose, capillary     Status: None   Collection Time: 01/04/24  7:19 AM  Result Value Ref Range   Glucose-Capillary 94 70 - 99 mg/dL    Comment: Glucose reference range applies only to samples taken after fasting for at least 8 hours.  Renal function panel     Status: Abnormal   Collection Time: 01/04/24 11:50 AM  Result Value Ref Range   Sodium 133 (L) 135 - 145 mmol/L   Potassium 3.6 3.5 - 5.1  mmol/L   Chloride 100 98 - 111 mmol/L   CO2 20 (L) 22 - 32 mmol/L   Glucose, Bld 86 70 - 99 mg/dL    Comment: Glucose reference range applies only to samples taken after fasting for at least 8 hours.   BUN 12 8 - 23 mg/dL   Creatinine, Ser 1.61 (H) 0.44 - 1.00 mg/dL   Calcium 9.3 8.9 - 09.6 mg/dL   Phosphorus 2.5 2.5 - 4.6 mg/dL   Albumin 3.5 3.5 - 5.0 g/dL   GFR, Estimated 37 (L) >60 mL/min    Comment: (NOTE) Calculated using the CKD-EPI Creatinine Equation (2021)    Anion gap 13 5 - 15    Comment: Performed at [facility], [address] [place], [place] [address]  CBC with Differential/Platelet     Status: None   Collection Time: 01/04/24 11:50 AM  Result Value Ref Range   WBC 10.0 4.0 - 10.5 K/uL   RBC 4.26 3.87 - 5.11 MIL/uL   Hemoglobin 13.0 12.0 - 15.0 g/dL   HCT 98.1 19.1 - 47.8 %   MCV 92.5 80.0 - 100.0 fL   MCH 30.5 26.0 - 34.0 pg   MCHC 33.0 30.0 - 36.0 g/dL   RDW 29.5 62.1 - 30.8 %   Platelets 260 150 - 400 K/uL   nRBC 0.0 0.0 - 0.2 %   Neutrophils Relative % 70 %   Neutro Abs 6.9 1.7 - 7.7 K/uL   Lymphocytes Relative 21 %   Lymphs Abs 2.1 0.7 - 4.0 K/uL   Monocytes Relative 8 %   Monocytes Absolute 0.8 0.1 - 1.0 K/uL   Eosinophils Relative 1 %   Eosinophils Absolute 0.1 0.0 - 0.5 K/uL   Basophils Relative 0 %   Basophils Absolute 0.0 0.0 - 0.1 K/uL   Immature Granulocytes 0 %   Abs Immature  Granulocytes 0.04 0.00 - 0.07 K/uL    Comment: Performed at [facility], [address] [place], [place] [address]  Magnesium     Status: None   Collection Time: 01/04/24 11:50 AM  Result Value Ref Range   Magnesium 1.7 1.7 - 2.4 mg/dL    Comment: Performed at [facility], [address] [place], [place] [address]  Glucose, capillary     Status: None   Collection Time: 01/04/24 11:59 AM  Result Value Ref Range   Glucose-Capillary 88 70 - 99 mg/dL    Comment: Glucose reference range applies only to samples taken after fasting for at least 8 hours.  Glucose, capillary     Status: Abnormal   Collection Time: 01/04/24  4:42 PM  Result Value Ref Range   Glucose-Capillary 119 (H) 70 - 99 mg/dL    Comment: Glucose reference range applies only to samples taken after fasting for at least 8 hours.  Glucose, capillary     Status: Abnormal   Collection Time: 01/04/24  8:19 PM  Result Value Ref Range   Glucose-Capillary 171 (H) 70 - 99 mg/dL    Comment: Glucose reference range applies only to samples taken after fasting for at least 8 hours.  Glucose, capillary     Status: Abnormal   Collection Time: 01/05/24  7:27 AM  Result Value Ref Range   Glucose-Capillary 103 (H) 70 - 99 mg/dL    Comment: Glucose reference range applies only to samples taken after fasting for at least 8 hours.  CBC with Differential/Platelet     Status: Abnormal  Collection Time: 01/05/24 11:05 AM  Result Value Ref Range   WBC 6.0 4.0 - 10.5 K/uL   RBC 3.92 3.87 - 5.11 MIL/uL   Hemoglobin 11.9 (L) 12.0 - 15.0 g/dL   HCT 40.9 81.1 - 91.4 %   MCV 92.3 80.0 - 100.0 fL   MCH 30.4 26.0 - 34.0 pg   MCHC 32.9 30.0 - 36.0 g/dL   RDW 78.2 95.6 - 21.3 %   Platelets 245 150 - 400 K/uL   nRBC 0.0 0.0 - 0.2 %   Neutrophils Relative % 68 %   Neutro Abs 4.1 1.7 - 7.7 K/uL   Lymphocytes Relative 23 %   Lymphs Abs 1.3 0.7 - 4.0 K/uL   Monocytes Relative 7 %   Monocytes Absolute 0.4 0.1  - 1.0 K/uL   Eosinophils Relative 2 %   Eosinophils Absolute 0.1 0.0 - 0.5 K/uL   Basophils Relative 0 %   Basophils Absolute 0.0 0.0 - 0.1 K/uL   Immature Granulocytes 0 %   Abs Immature Granulocytes 0.01 0.00 - 0.07 K/uL    Comment: Performed at [facility], [address], [place], [place] [address]  Basic metabolic panel     Status: Abnormal   Collection Time: 01/05/24 11:05 AM  Result Value Ref Range   Sodium 135 135 - 145 mmol/L   Potassium 3.3 (L) 3.5 - 5.1 mmol/L   Chloride 101 98 - 111 mmol/L   CO2 24 22 - 32 mmol/L   Glucose, Bld 158 (H) 70 - 99 mg/dL    Comment: Glucose reference range applies only to samples taken after fasting for at least 8 hours.   BUN 12 8 - 23 mg/dL   Creatinine, Ser 8.46 (H) 0.44 - 1.00 mg/dL   Calcium 9.2 8.9 - 96.2 mg/dL   GFR, Estimated 33 (L) >60 mL/min    Comment: (NOTE) Calculated using the CKD-EPI Creatinine Equation (2021)    Anion gap 10 5 - 15    Comment: Performed at [facility], [address], [place], [place] [address]  Glucose, capillary     Status: Abnormal   Collection Time: 01/05/24 11:28 AM  Result Value Ref Range   Glucose-Capillary 134 (H) 70 - 99 mg/dL    Comment: Glucose reference range applies only to samples taken after fasting for at least 8 hours.   No results found.  Anti-infectives (From admission, onward)    Start     Dose/Rate Route Frequency Ordered Stop   01/03/24 0600  metroNIDAZOLE  (FLAGYL ) IVPB 500 mg        500 mg 100 mL/hr over 60 Minutes Intravenous Every 12 hours 01/02/24 2157     01/03/24 0200  cefTRIAXone  (ROCEPHIN ) 2 g in sodium chloride  0.9 % 100 mL IVPB        2 g 200 mL/hr over 30 Minutes Intravenous Every 24 hours 01/02/24 2157     01/02/24 1730  ceFEPIme (MAXIPIME) 2 g in sodium chloride  0.9 % 100 mL IVPB       Placed in "And" Linked Group   2 g 200 mL/hr over 30 Minutes Intravenous  Once 01/02/24 1727 01/02/24 1857   01/02/24 1730  metroNIDAZOLE   (FLAGYL ) IVPB 500 mg       Placed in "And" Linked Group   500 mg 100 mL/hr over 60 Minutes Intravenous  Once 01/02/24 1727 01/02/24 1948       Assessment/Plan Alexandria Durham is a 78 year old female who we are asked to  see for diverticulitis.  She reports that her abdominal pain began most immediately following her endometrial biopsy that was done at a outside hospital on 4/29.  Her CT scan that was done on 5/4 showed mild diverticulitis near the junction of the descending and sigmoid colon without evidence of perforation or abscess.  She is hemodynamically stable here without fever, tachycardia or hypotension.  Her WBC has normalized.  I do not think she needs emergency surgery at this time, however I do agree with [facility] that I am concerned about her abdominal exam.  After discussion with my attending I think the best first step would be to get GYN involved given the timeline of her abdominal pain onset to see if this is related all to her recent endometrial biopsy.  Could consider a repeat CT scan in the near future pending their input.  She would back down to n.p.o. until GYN has seen her.  I would not advance past liquids from our standpoint.  Continue antibiotics.  I discussed above with [facility] over the phone.  FEN - NPO as above, IVF per primary  VTE - SCDs, okay for chem ppx from a general surgery standpoint ID - Rocephin /Flagyl   I reviewed nursing notes, hospitalist notes, last 24 h vitals and pain scores, last 48 h intake and output, last 24 h labs and trends, and last 24 h imaging results  Delton Filbert, [facility] 01/05/2024, 3:04 PM Please see Amion for pager number during day hours 7:00am-4:30pm

## 2024-01-05 NOTE — Plan of Care (Signed)
 ?  Problem: Clinical Measurements: ?Goal: Will remain free from infection ?Outcome: Progressing ?  ?

## 2024-01-05 NOTE — Progress Notes (Signed)
 Progress Note   Patient: Alexandria Durham UJW:[id number] DOB: 1946-07-21 DOA: 01/02/2024     0 DOS: the patient was seen and examined on 01/05/2024   Brief hospital course: 65 with h/o stage 3a CKD, DM, HTN, HLD, and endometrial abnormality s/p recent biopsy who presented on 5/4 with abdominal pain and n/v.  Imaging with mild diverticulitis, treated with Ceftriaxone  and metronidazole .  Assessment and Plan:  Acute descending colon/sigmoid diverticulitis Continue IV Rocephin  plus Flagyl  Tolerating clear diet, advanced to full and then asked to advance to soft diet Advanced possibly too quickly, got n/v and worsening pain so backed down again   Vitamin D deficiency Continue vitamin D repletion   Type 2 diabetes mellitus with diabetic polyneuropathy, without long-term current use of insulin  A1c 6.8, good control Continue Januvia Continue gabapentin, stop pregabalin (should not be taking both)   Postoperative hypothyroidism Continue levothyroxine at current dose Appears to be managed by her PCP and was normal 8 months ago   Mixed hyperlipidemia/PVD Has been intolerant of statins Continue Zetia   Gastroesophageal reflux disease without esophagitis Continue PPI   Essential hypertension Continue losartan, hydrochlorothiazide Lopressor as needed   Stage 3a chronic kidney disease Appears to be stable at this time Attempt to avoid nephrotoxic medications Recheck BMP in AM    Endometrial abnormality Worrisome for endometrial cancer Recent endometrial biopsy without evidence of hyperplasia or malignancy Follow-up with OB/GYN on discharge [name], MD), likely needs outpatient D&C with hysteroscopy Discussed with GYN - recommend adding oral doxy to see if this helps for possible endometritis Possibly would benefit from [facility] Unlikely to benefit from further imaging If not quickly improving, can transfer over to [facility] for D&C   Mood d/o Continue bupropion        Consultants: None   Procedures: None   Antibiotics: Cefepime x 1 Ceftriaxone  5/5- Metronidazole  5/4- Doxycycline  5/7-       Subjective: Continuing to have significant and worsening abdominal pain.  No further n/v since yesterday.   Objective: Vitals:   01/05/24 0511 01/05/24 1410  BP: 131/65 133/67  Pulse: 86 74  Resp: 19 18  Temp: 97.7 F (36.5 C) (!) 97.4 F (36.3 C)  SpO2: 100% 100%    Intake/Output Summary (Last 24 hours) at 01/05/2024 1511 Last data filed at 01/05/2024 1400 Gross per 24 hour  Intake 1039.86 ml  Output 1400 ml  Net -360.14 ml   Filed Weights   01/02/24 1327 01/02/24 2041  Weight: 69.4 kg 71.3 kg    Exam:  General:  Appears calm and comfortable and is in NAD Eyes:  EOMI, normal lids, iris ENT:  grossly normal hearing, lips & tongue, mmm Neck:  no LAD, masses or thyromegaly Cardiovascular:  RRR, no m/r/g. No LE edema.  Respiratory:   CTA bilaterally with no wheezes/rales/rhonchi.  Normal respiratory effort. Abdomen:  soft, mildly diffusely TTP, ND Skin:  no rash or induration seen on limited exam Musculoskeletal:  grossly normal tone BUE/BLE, good ROM, no bony abnormality Psychiatric:  grossly normal mood and affect, speech fluent and appropriate, AOx3 Neurologic:  CN 2-12 grossly intact, moves all extremities in coordinated fashion  Data Reviewed: I have reviewed the patient's lab results since admission.  Pertinent labs for today include:    Stable on 5/6      Family Communication: None present today  Disposition: Status is: Observation The patient remains OBS appropriate and will d/c before 2 midnights.     Time spent: 50 minutes  Unresulted  Labs (From admission, onward)     Start     Ordered   01/06/24 0500  CBC with Differential/Platelet  Tomorrow morning,   R        01/05/24 1511   01/06/24 0500  Basic metabolic panel with GFR  Tomorrow morning,   R        01/05/24 1511             Author: Lorita Rosa, MD 01/05/2024 3:11 PM  For on call review [email or web address].

## 2024-01-06 ENCOUNTER — Other Ambulatory Visit: Payer: Self-pay

## 2024-01-06 ENCOUNTER — Inpatient Hospital Stay (HOSPITAL_COMMUNITY): Admitting: Certified Registered Nurse Anesthetist

## 2024-01-06 ENCOUNTER — Encounter (HOSPITAL_COMMUNITY): Payer: Self-pay | Admitting: Internal Medicine

## 2024-01-06 ENCOUNTER — Encounter (HOSPITAL_COMMUNITY)
Admission: EM | Disposition: A | Discharge status: Home Health | Payer: Self-pay | Source: Home / Self Care | Attending: Internal Medicine

## 2024-01-06 DIAGNOSIS — E1142 Type 2 diabetes mellitus with diabetic polyneuropathy: Secondary | ICD-10-CM | POA: Diagnosis present

## 2024-01-06 DIAGNOSIS — N719 Inflammatory disease of uterus, unspecified: Secondary | ICD-10-CM | POA: Diagnosis present

## 2024-01-06 DIAGNOSIS — R9389 Abnormal findings on diagnostic imaging of other specified body structures: Secondary | ICD-10-CM | POA: Diagnosis present

## 2024-01-06 DIAGNOSIS — K5900 Constipation, unspecified: Secondary | ICD-10-CM | POA: Diagnosis present

## 2024-01-06 DIAGNOSIS — N9971 Accidental puncture and laceration of a genitourinary system organ or structure during a genitourinary system procedure: Secondary | ICD-10-CM | POA: Diagnosis not present

## 2024-01-06 DIAGNOSIS — F17210 Nicotine dependence, cigarettes, uncomplicated: Secondary | ICD-10-CM | POA: Diagnosis present

## 2024-01-06 DIAGNOSIS — Z888 Allergy status to other drugs, medicaments and biological substances status: Secondary | ICD-10-CM | POA: Diagnosis not present

## 2024-01-06 DIAGNOSIS — E119 Type 2 diabetes mellitus without complications: Secondary | ICD-10-CM

## 2024-01-06 DIAGNOSIS — K5792 Diverticulitis of intestine, part unspecified, without perforation or abscess without bleeding: Secondary | ICD-10-CM | POA: Diagnosis present

## 2024-01-06 DIAGNOSIS — S3769XA Other injury of uterus, initial encounter: Secondary | ICD-10-CM | POA: Diagnosis present

## 2024-01-06 DIAGNOSIS — N1831 Chronic kidney disease, stage 3a: Secondary | ICD-10-CM | POA: Diagnosis present

## 2024-01-06 DIAGNOSIS — I129 Hypertensive chronic kidney disease with stage 1 through stage 4 chronic kidney disease, or unspecified chronic kidney disease: Secondary | ICD-10-CM | POA: Diagnosis present

## 2024-01-06 DIAGNOSIS — Z7984 Long term (current) use of oral hypoglycemic drugs: Secondary | ICD-10-CM | POA: Diagnosis not present

## 2024-01-06 DIAGNOSIS — E1122 Type 2 diabetes mellitus with diabetic chronic kidney disease: Secondary | ICD-10-CM | POA: Diagnosis present

## 2024-01-06 DIAGNOSIS — Z885 Allergy status to narcotic agent status: Secondary | ICD-10-CM | POA: Diagnosis not present

## 2024-01-06 DIAGNOSIS — I10 Essential (primary) hypertension: Secondary | ICD-10-CM

## 2024-01-06 DIAGNOSIS — Z880 Allergy status to penicillin: Secondary | ICD-10-CM | POA: Diagnosis not present

## 2024-01-06 DIAGNOSIS — F39 Unspecified mood [affective] disorder: Secondary | ICD-10-CM | POA: Diagnosis present

## 2024-01-06 DIAGNOSIS — E890 Postprocedural hypothyroidism: Secondary | ICD-10-CM | POA: Diagnosis present

## 2024-01-06 DIAGNOSIS — Z79899 Other long term (current) drug therapy: Secondary | ICD-10-CM | POA: Diagnosis not present

## 2024-01-06 DIAGNOSIS — E1151 Type 2 diabetes mellitus with diabetic peripheral angiopathy without gangrene: Secondary | ICD-10-CM | POA: Diagnosis present

## 2024-01-06 DIAGNOSIS — K219 Gastro-esophageal reflux disease without esophagitis: Secondary | ICD-10-CM | POA: Diagnosis present

## 2024-01-06 DIAGNOSIS — K5732 Diverticulitis of large intestine without perforation or abscess without bleeding: Secondary | ICD-10-CM | POA: Diagnosis present

## 2024-01-06 DIAGNOSIS — R262 Difficulty in walking, not elsewhere classified: Secondary | ICD-10-CM | POA: Diagnosis present

## 2024-01-06 DIAGNOSIS — Z886 Allergy status to analgesic agent status: Secondary | ICD-10-CM | POA: Diagnosis not present

## 2024-01-06 DIAGNOSIS — R102 Pelvic and perineal pain: Secondary | ICD-10-CM | POA: Diagnosis present

## 2024-01-06 DIAGNOSIS — R109 Unspecified abdominal pain: Secondary | ICD-10-CM | POA: Diagnosis present

## 2024-01-06 DIAGNOSIS — E782 Mixed hyperlipidemia: Secondary | ICD-10-CM | POA: Diagnosis present

## 2024-01-06 DIAGNOSIS — K660 Peritoneal adhesions (postprocedural) (postinfection): Secondary | ICD-10-CM

## 2024-01-06 DIAGNOSIS — Z79890 Hormone replacement therapy (postmenopausal): Secondary | ICD-10-CM | POA: Diagnosis not present

## 2024-01-06 HISTORY — PX: HYSTEROSCOPY WITH D & C: SHX1775

## 2024-01-06 HISTORY — PX: LAPAROSCOPIC LYSIS OF ADHESIONS: SHX5905

## 2024-01-06 HISTORY — PX: LAPAROSCOPY: SHX197

## 2024-01-06 LAB — CBC WITH DIFFERENTIAL/PLATELET
Abs Immature Granulocytes: 0.02 10*3/uL (ref 0.00–0.07)
Basophils Absolute: 0 10*3/uL (ref 0.0–0.1)
Basophils Relative: 0 %
Eosinophils Absolute: 0.1 10*3/uL (ref 0.0–0.5)
Eosinophils Relative: 2 %
HCT: 33.6 % — ABNORMAL LOW (ref 36.0–46.0)
Hemoglobin: 11 g/dL — ABNORMAL LOW (ref 12.0–15.0)
Immature Granulocytes: 0 %
Lymphocytes Relative: 33 %
Lymphs Abs: 1.7 10*3/uL (ref 0.7–4.0)
MCH: 30.5 pg (ref 26.0–34.0)
MCHC: 32.7 g/dL (ref 30.0–36.0)
MCV: 93.1 fL (ref 80.0–100.0)
Monocytes Absolute: 0.6 10*3/uL (ref 0.1–1.0)
Monocytes Relative: 11 %
Neutro Abs: 2.7 10*3/uL (ref 1.7–7.7)
Neutrophils Relative %: 54 %
Platelets: 240 10*3/uL (ref 150–400)
RBC: 3.61 MIL/uL — ABNORMAL LOW (ref 3.87–5.11)
RDW: 14.3 % (ref 11.5–15.5)
WBC: 5.1 10*3/uL (ref 4.0–10.5)
nRBC: 0 % (ref 0.0–0.2)

## 2024-01-06 LAB — BASIC METABOLIC PANEL WITH GFR
Anion gap: 6 (ref 5–15)
BUN: 8 mg/dL (ref 8–23)
CO2: 26 mmol/L (ref 22–32)
Calcium: 9 mg/dL (ref 8.9–10.3)
Chloride: 106 mmol/L (ref 98–111)
Creatinine, Ser: 1.19 mg/dL — ABNORMAL HIGH (ref 0.44–1.00)
GFR, Estimated: 47 mL/min — ABNORMAL LOW (ref >60)
Glucose, Bld: 96 mg/dL (ref 70–99)
Potassium: 3.5 mmol/L (ref 3.5–5.1)
Sodium: 138 mmol/L (ref 135–145)

## 2024-01-06 LAB — GLUCOSE, CAPILLARY
Glucose-Capillary: 125 mg/dL — ABNORMAL HIGH (ref 70–99)
Glucose-Capillary: 87 mg/dL (ref 70–99)
Glucose-Capillary: 105 mg/dL — ABNORMAL HIGH (ref 70–99)
Glucose-Capillary: 133 mg/dL — ABNORMAL HIGH (ref 70–99)
Glucose-Capillary: 147 mg/dL — ABNORMAL HIGH (ref 70–99)
Glucose-Capillary: 102 mg/dL — ABNORMAL HIGH (ref 70–99)

## 2024-01-06 SURGERY — DILATATION AND CURETTAGE /HYSTEROSCOPY
Anesthesia: General | Site: Abdomen

## 2024-01-06 MED ORDER — PHENYLEPHRINE 80 MCG/ML (10ML) SYRINGE FOR IV PUSH (FOR BLOOD PRESSURE SUPPORT)
PREFILLED_SYRINGE | INTRAVENOUS | Status: DC | PRN
  Administered 2024-01-06 (×2): 160 ug via INTRAVENOUS

## 2024-01-06 MED ORDER — FENTANYL CITRATE (PF) 100 MCG/2ML IJ SOLN
INTRAMUSCULAR | Status: AC
  Filled 2024-01-06: qty 2

## 2024-01-06 MED ORDER — FENTANYL CITRATE PF 50 MCG/ML IJ SOSY
PREFILLED_SYRINGE | INTRAMUSCULAR | Status: AC
  Filled 2024-01-06: qty 3

## 2024-01-06 MED ORDER — ROCURONIUM BROMIDE 10 MG/ML (PF) SYRINGE
PREFILLED_SYRINGE | INTRAVENOUS | Status: DC | PRN
  Administered 2024-01-06: 60 mg via INTRAVENOUS

## 2024-01-06 MED ORDER — ONDANSETRON HCL 4 MG/2ML IJ SOLN
INTRAMUSCULAR | Status: DC | PRN
  Administered 2024-01-06: 4 mg via INTRAVENOUS

## 2024-01-06 MED ORDER — SUGAMMADEX SODIUM 200 MG/2ML IV SOLN
INTRAVENOUS | Status: DC | PRN
Start: 2024-01-06 — End: 2024-01-06
  Administered 2024-01-06: 200 mg via INTRAVENOUS

## 2024-01-06 MED ORDER — PROPOFOL 10 MG/ML IV BOLUS
INTRAVENOUS | Status: DC | PRN
  Administered 2024-01-06: 150 mg via INTRAVENOUS

## 2024-01-06 MED ORDER — ENOXAPARIN SODIUM 40 MG/0.4ML IJ SOSY
40.0000 mg | PREFILLED_SYRINGE | INTRAMUSCULAR | Status: DC
  Administered 2024-01-07 – 2024-01-09 (×3): 40 mg via SUBCUTANEOUS
  Filled 2024-01-06 (×3): qty 0.4

## 2024-01-06 MED ORDER — FENTANYL CITRATE PF 50 MCG/ML IJ SOSY
25.0000 ug | PREFILLED_SYRINGE | INTRAMUSCULAR | Status: DC | PRN
  Administered 2024-01-06 (×3): 50 ug via INTRAVENOUS

## 2024-01-06 MED ORDER — PHENYLEPHRINE HCL (PRESSORS) 10 MG/ML IV SOLN
INTRAVENOUS | Status: AC
  Filled 2024-01-06: qty 1

## 2024-01-06 MED ORDER — OXYCODONE HCL 5 MG/5ML PO SOLN
ORAL | Status: AC
Start: 2024-01-06 — End: 2024-01-07
  Filled 2024-01-06: qty 5

## 2024-01-06 MED ORDER — FENTANYL CITRATE (PF) 100 MCG/2ML IJ SOLN
INTRAMUSCULAR | Status: DC | PRN
  Administered 2024-01-06: 50 ug via INTRAVENOUS
  Administered 2024-01-06: 25 ug via INTRAVENOUS
  Administered 2024-01-06: 50 ug via INTRAVENOUS
  Administered 2024-01-06: 25 ug via INTRAVENOUS
  Administered 2024-01-06 (×2): 50 ug via INTRAVENOUS

## 2024-01-06 MED ORDER — OXYCODONE HCL 5 MG PO TABS: 5.0000 mg | ORAL_TABLET | Freq: Once | ORAL | Status: AC | PRN

## 2024-01-06 MED ORDER — HEMOSTATIC AGENTS (NO CHARGE) OPTIME
TOPICAL | Status: DC | PRN
  Administered 2024-01-06: 1 via TOPICAL

## 2024-01-06 MED ORDER — 0.9 % SODIUM CHLORIDE (POUR BTL) OPTIME
TOPICAL | Status: DC | PRN
  Administered 2024-01-06: 1000 mL

## 2024-01-06 MED ORDER — ONDANSETRON HCL 4 MG/2ML IJ SOLN
INTRAMUSCULAR | Status: AC
  Filled 2024-01-06: qty 2

## 2024-01-06 MED ORDER — EPHEDRINE SULFATE-NACL 50-0.9 MG/10ML-% IV SOSY
PREFILLED_SYRINGE | INTRAVENOUS | Status: DC | PRN
  Administered 2024-01-06 (×2): 5 mg via INTRAVENOUS

## 2024-01-06 MED ORDER — CEFAZOLIN SODIUM 1 G IJ SOLR
INTRAMUSCULAR | Status: AC
  Filled 2024-01-06: qty 20

## 2024-01-06 MED ORDER — OXYCODONE HCL 5 MG/5ML PO SOLN
5.0000 mg | Freq: Once | ORAL | Status: AC | PRN
  Administered 2024-01-06: 5 mg via ORAL

## 2024-01-06 MED ORDER — FENTANYL CITRATE (PF) 250 MCG/5ML IJ SOLN
INTRAMUSCULAR | Status: AC
  Filled 2024-01-06: qty 5

## 2024-01-06 MED ORDER — PROPOFOL 10 MG/ML IV BOLUS
INTRAVENOUS | Status: AC
  Filled 2024-01-06: qty 20

## 2024-01-06 MED ORDER — LIDOCAINE HCL (PF) 2 % IJ SOLN
INTRAMUSCULAR | Status: DC | PRN
  Administered 2024-01-06: 20 mg via INTRADERMAL

## 2024-01-06 MED ORDER — CHLORHEXIDINE GLUCONATE 0.12 % MT SOLN
15.0000 mL | Freq: Once | OROMUCOSAL | Status: AC
  Administered 2024-01-06: 15 mL via OROMUCOSAL
  Filled 2024-01-06: qty 15

## 2024-01-06 MED ORDER — LIDOCAINE 2% (20 MG/ML) 5 ML SYRINGE: INTRAMUSCULAR | Status: DC | PRN

## 2024-01-06 MED ORDER — LIDOCAINE HCL (PF) 2 % IJ SOLN
INTRAMUSCULAR | Status: AC
  Filled 2024-01-06: qty 5

## 2024-01-06 MED ORDER — POVIDONE-IODINE 10 % EX SWAB
2.0000 | Freq: Once | CUTANEOUS | Status: AC
  Administered 2024-01-06: 2 via TOPICAL

## 2024-01-06 MED ORDER — BUPIVACAINE HCL 0.25 % IJ SOLN
INTRAMUSCULAR | Status: DC | PRN
  Administered 2024-01-06: 10 mL

## 2024-01-06 MED ORDER — EPHEDRINE 5 MG/ML INJ
INTRAVENOUS | Status: AC
  Filled 2024-01-06: qty 5

## 2024-01-06 MED ORDER — PHENYLEPHRINE 80 MCG/ML (10ML) SYRINGE FOR IV PUSH (FOR BLOOD PRESSURE SUPPORT)
PREFILLED_SYRINGE | INTRAVENOUS | Status: AC
  Filled 2024-01-06: qty 10

## 2024-01-06 MED ORDER — SODIUM CHLORIDE 0.9 % IR SOLN
Status: DC | PRN
  Administered 2024-01-06: 3000 mL

## 2024-01-06 MED ORDER — MIDAZOLAM HCL 2 MG/2ML IJ SOLN: 0.5000 mg | Freq: Once | INTRAMUSCULAR | Status: DC | PRN

## 2024-01-06 MED ORDER — INSULIN ASPART 100 UNIT/ML IJ SOLN
0.0000 [IU] | INTRAMUSCULAR | Status: DC | PRN
Start: 2024-01-06 — End: 2024-01-06

## 2024-01-06 MED ORDER — CEFAZOLIN SODIUM-DEXTROSE 2-4 GM/100ML-% IV SOLN
2.0000 g | Freq: Once | INTRAVENOUS | Status: AC
Start: 2024-01-06 — End: 2024-01-06
  Administered 2024-01-06: 2 g via INTRAVENOUS

## 2024-01-06 MED ORDER — LACTATED RINGERS IV SOLN: INTRAVENOUS | Status: AC

## 2024-01-06 SURGICAL SUPPLY — 34 items
BAG COUNTER SPONGE SURGICOUNT (BAG) ×3 IMPLANT
CABLE HIGH FREQUENCY MONO STRZ (ELECTRODE) IMPLANT
CHLORAPREP W/TINT 26 (MISCELLANEOUS) IMPLANT
DEVICE MYOSURE LITE (MISCELLANEOUS) IMPLANT
DEVICE MYOSURE REACH (MISCELLANEOUS) IMPLANT
DILATOR CANAL MILEX (MISCELLANEOUS) IMPLANT
GAUZE 4X4 16PLY ~~LOC~~+RFID DBL (SPONGE) IMPLANT
GLOVE BIO SURGEON STRL SZ 6 (GLOVE) ×6 IMPLANT
GOWN STRL REUS W/ TWL LRG LVL3 (GOWN DISPOSABLE) ×3 IMPLANT
IRRIGATION SUCT STRKRFLW 2 WTP (MISCELLANEOUS) IMPLANT
IV NS IRRIG 3000ML ARTHROMATIC (IV SOLUTION) ×3 IMPLANT
KIT BASIN OR (CUSTOM PROCEDURE TRAY) IMPLANT
KIT PROCEDURE FLUENT (KITS) IMPLANT
KIT TURNOVER KIT A (KITS) IMPLANT
LOOP CUTTING BIPOLAR 21FR (ELECTRODE) IMPLANT
NDL INSUFFLATION 14GA 120MM (NEEDLE) IMPLANT
NEEDLE INSUFFLATION 14GA 120MM (NEEDLE) ×3 IMPLANT
PACK GENERAL/GYN (CUSTOM PROCEDURE TRAY) IMPLANT
PACK VAGINAL WOMENS (CUSTOM PROCEDURE TRAY) ×3 IMPLANT
PAD OB MATERNITY 11 LF (PERSONAL CARE ITEMS) IMPLANT
POWDER SURGICEL 3.0 GRAM (HEMOSTASIS) IMPLANT
SCISSORS LAP 5X35 DISP (ENDOMECHANICALS) IMPLANT
SEAL ROD LENS SCOPE MYOSURE (ABLATOR) IMPLANT
SET TUBE SMOKE EVAC HIGH FLOW (TUBING) IMPLANT
SLEEVE ADV FIXATION 5X100MM (TROCAR) IMPLANT
SLEEVE Z-THREAD 5X100MM (TROCAR) IMPLANT
SUT VIC AB 4-0 PS2 18 (SUTURE) IMPLANT
SYR BULB IRRIG 60ML STRL (SYRINGE) ×3 IMPLANT
SYSTEM TISS REMOVAL MYOSURE XL (MISCELLANEOUS) IMPLANT
TIP ENDOSCOPIC SURGICEL (TIP) IMPLANT
TOWEL OR 17X26 10 PK STRL BLUE (TOWEL DISPOSABLE) ×3 IMPLANT
TROCAR XCEL NON-BLD 5MMX100MML (ENDOMECHANICALS) IMPLANT
TROCAR Z-THREAD OPTICAL 5X100M (TROCAR) IMPLANT
WATER STERILE IRR 500ML POUR (IV SOLUTION) ×3 IMPLANT

## 2024-01-06 NOTE — Anesthesia Preprocedure Evaluation (Addendum)
 Anesthesia Evaluation  Patient identified by MRN, date of birth, ID band Patient awake    Reviewed: Allergy & Precautions, NPO status , Patient's Chart, lab work & pertinent test results  History of Anesthesia Complications Negative for: history of anesthetic complications  Airway Mallampati: II  TM Distance: >3 FB Neck ROM: Full    Dental  (+) Dental Advisory Given, Missing   Pulmonary Current Smoker and Patient abstained from smoking.   breath sounds clear to auscultation       Cardiovascular hypertension, Pt. on medications (-) angina + Peripheral Vascular Disease   Rhythm:Regular Rate:Normal     Neuro/Psych negative neurological ROS     GI/Hepatic Neg liver ROS,GERD  Medicated and Controlled,,  Endo/Other  diabetes (glu 87), Oral Hypoglycemic AgentsHypothyroidism    Renal/GU Renal InsufficiencyRenal disease     Musculoskeletal   Abdominal   Peds  Hematology Hb 11.0, plt 240k   Anesthesia Other Findings   Reproductive/Obstetrics                             Anesthesia Physical Anesthesia Plan  ASA: 2  Anesthesia Plan: General   Post-op Pain Management: Tylenol  PO (pre-op)*   Induction: Intravenous  PONV Risk Score and Plan: 2 and Dexamethasone and Ondansetron   Airway Management Planned: LMA  Additional Equipment: None  Intra-op Plan:   Post-operative Plan:   Informed Consent: I have reviewed the patients History and Physical, chart, labs and discussed the procedure including the risks, benefits and alternatives for the proposed anesthesia with the patient or authorized representative who has indicated his/her understanding and acceptance.     Dental advisory given  Plan Discussed with: CRNA and Surgeon  Anesthesia Plan Comments:         Anesthesia Quick Evaluation

## 2024-01-06 NOTE — Consult Note (Signed)
 OBSTETRICS AND GYNECOLOGY ATTENDING CONSULT NOTE  Consult Date: 01/06/2024  Assessment/Plan: Plan is for [facility], hysteroscopy and pap smear, with EUA. Discussed surgery with patient and all questions answered. Reviewed risks/benefits.  Reviewed multiple specimens would be taken. Reviewed that her EMB was negative for hyperplasia and malignancy but that we would re-confirm that today.    Appreciate care of Okey Berg by her primary team  [name] MD, FACOG Obstetrician & Gynecologist, [facility] for [facility], [facility] Consult Phone: [phone]  History of Present Illness: Alexandria Durham is an 78 y.o. [phone] female who was admitted with abdominal pain and N/V. She had an EMB on 4/29 due to thickened EL seen by CT through [facility]. Her EMB was negative for hyperplasia and malignancy. She then came with pain on 5/4 and was admitted. She was started on Ceftriaxone  and Flagyl  due to mild diverticulitis on CT imaging through [facility] and initially improved but then started to worsen again during this hospital admission despite antibiotics. She was seen by general surgery team who also recommended second opinion by GYN.  She had ongoing and worse pain today despite the addition of Doxycycline . She was NPO overnight.   I reviewed her CT images independently and my findings are: Her uterine cavity appears distended by some type of fluid    I reviewed CareEverywhere:  CT done on 4/8 showed diffuse distended endometrium possibly due to obstructing mass (no mass seen, but this was in their differential). Also notes severe diffuse colonic diverticulosis.  TVUS done on 4/8 which showed 2.6 cm lining but diffuse distension of the endometrial cavity with internal echoes possibly due to cervical stenosis.  I reviewed the note from the OB/GYN on 4/29 who did the EMB. The CMA note reports possible abnormal with cryo but was likely over 20 years prior. Patient was unsure. She  denies PMB. Her pelvic exam was documented as unremarkable. There was no report of cervical stenosis at the time of her EMB.   Pertinent OB/GYN History: No LMP recorded. Patient is postmenopausal. OB History  Gravida Para Term Preterm AB Living  4 2 2  0 2 2  SAB IAB Ectopic Multiple Live Births  0 0 2 0 2    # Outcome Date GA Lbr Len/2nd Weight Sex Type Anes PTL Lv  4 Ectopic           3 Ectopic           2 Term           1 Term             GYN issues - h/o abnormal pap for which she had cryotherapy. She had 2 ectopic pregnancies and had to have an ex lap for at least one of them.   Patient Active Problem List   Diagnosis Date Noted   Abdominal pain 01/06/2024   Diverticulitis 01/02/2024   Acute idiopathic gout of left foot 12/23/2023   Avascular necrosis of bone of hip, left (HCC) 12/23/2023   Carpal tunnel syndrome of left wrist 09/20/2019   Bilateral carotid artery stenosis 05/10/2019   PVD (peripheral vascular disease) (HCC) 05/10/2019   Lumbar radiculopathy 07/06/2018   Lumbar spondylosis 07/06/2018   Stage 3a chronic kidney disease (HCC) 06/06/2018   Mixed hyperlipidemia 02/24/2018   Cervical radiculopathy 09/29/2017   Former smoker 05/06/2017   Postoperative hypothyroidism 04/14/2017   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   Cortical age-related cataract of both eyes 11/25/2016  Asymmetric septal hypertrophy 04/30/2016   Gastroesophageal reflux disease without esophagitis 03/02/2016   Cardiac murmur, unspecified 02/24/2016   Benign hypertension with CKD (chronic kidney disease) stage III (HCC) 01/01/2016   Vitamin D deficiency 12/31/2015   Essential hypertension 11/21/2015   Eczema 08/28/2015   Type 2 diabetes mellitus (HCC) 08/28/2015   Type 2 diabetes mellitus with diabetic polyneuropathy, without long-term current use of insulin (HCC) 08/28/2015    Past Medical History:  Diagnosis Date   Arthritis    Diabetes mellitus    Hypercholesteremia    Hypertension      Past Surgical History:  Procedure Laterality Date   BREAST CYST EXCISION     HEMORRHOID SURGERY     HIP SURGERY     JOINT REPLACEMENT     hip replacement   TONSILLECTOMY     TUBAL LIGATION      History reviewed. No pertinent family history.  Social History:  reports that she has been smoking cigarettes. She has never been exposed to tobacco smoke. She has never used smokeless tobacco. She reports that she does not drink alcohol and does not use drugs.  Allergies:  Allergies  Allergen Reactions   Ace Inhibitors Hives and Itching   Aspirin Palpitations   Codeine Palpitations   Penicillins Rash    Medications: I have reviewed the patient's current medications.  Review of Systems: Pertinent items are noted in HPI.  Focused Physical Examination: BP (!) 168/75 (BP Location: Right Arm)   Pulse 76   Temp 97.9 F (36.6 C) (Oral)   Resp 16   Ht 5\' 4"  (1.626 m)   Wt 71.3 kg   SpO2 97%   BMI 26.98 kg/m  CONSTITUTIONAL: Well-developed, well-nourished female in no acute distress.  NEUROLOGIC: Alert and oriented to person, place, and time. Normal reflexes, muscle tone coordination. No cranial nerve deficit noted. PSYCHIATRIC: Normal mood and affect. Normal behavior. Normal judgment and thought content. CARDIOVASCULAR: Normal heart rate noted, regular rhythm RESPIRATORY: Clear to auscultation bilaterally. Effort and breath sounds normal, no problems with respiration noted. ABDOMEN: Soft, normal bowel sounds, no distention noted.  Tenderness noted PELVIC: Not examined MUSCULOSKELETAL: Normal range of motion. No tenderness.  No cyanosis, clubbing, or edema.  2+ distal pulses.  Labs and Imaging: Results for orders placed or performed during the hospital encounter of 01/02/24 (from the past 72 hours)  Glucose, capillary     Status: Abnormal   Collection Time: 01/03/24 11:51 AM  Result Value Ref Range   Glucose-Capillary 105 (H) 70 - 99 mg/dL    Comment: Glucose reference  range applies only to samples taken after fasting for at least 8 hours.  Glucose, capillary     Status: Abnormal   Collection Time: 01/03/24  4:24 PM  Result Value Ref Range   Glucose-Capillary 113 (H) 70 - 99 mg/dL    Comment: Glucose reference range applies only to samples taken after fasting for at least 8 hours.  Glucose, capillary     Status: Abnormal   Collection Time: 01/03/24  9:52 PM  Result Value Ref Range   Glucose-Capillary 118 (H) 70 - 99 mg/dL    Comment: Glucose reference range applies only to samples taken after fasting for at least 8 hours.  Glucose, capillary     Status: None   Collection Time: 01/04/24  7:19 AM  Result Value Ref Range   Glucose-Capillary 94 70 - 99 mg/dL    Comment: Glucose reference range applies only to samples taken after fasting for  at least 8 hours.  Renal function panel     Status: Abnormal   Collection Time: 01/04/24 11:50 AM  Result Value Ref Range   Sodium 133 (L) 135 - 145 mmol/L   Potassium 3.6 3.5 - 5.1 mmol/L   Chloride 100 98 - 111 mmol/L   CO2 20 (L) 22 - 32 mmol/L   Glucose, Bld 86 70 - 99 mg/dL    Comment: Glucose reference range applies only to samples taken after fasting for at least 8 hours.   BUN 12 8 - 23 mg/dL   Creatinine, Ser 7.82 (H) 0.44 - 1.00 mg/dL   Calcium 9.3 8.9 - 95.6 mg/dL   Phosphorus 2.5 2.5 - 4.6 mg/dL   Albumin 3.5 3.5 - 5.0 g/dL   GFR, Estimated 37 (L) >60 mL/min    Comment: (NOTE) Calculated using the CKD-EPI Creatinine Equation (2021)    Anion gap 13 5 - 15    Comment: Performed at [facility], [address] [place], [place] [address]  CBC with Differential/Platelet     Status: None   Collection Time: 01/04/24 11:50 AM  Result Value Ref Range   WBC 10.0 4.0 - 10.5 K/uL   RBC 4.26 3.87 - 5.11 MIL/uL   Hemoglobin 13.0 12.0 - 15.0 g/dL   HCT 65.7 84.6 - 96.2 %   MCV 92.5 80.0 - 100.0 fL   MCH 30.5 26.0 - 34.0 pg   MCHC 33.0 30.0 - 36.0 g/dL   RDW 95.2 84.1 - 32.4 %    Platelets 260 150 - 400 K/uL   nRBC 0.0 0.0 - 0.2 %   Neutrophils Relative % 70 %   Neutro Abs 6.9 1.7 - 7.7 K/uL   Lymphocytes Relative 21 %   Lymphs Abs 2.1 0.7 - 4.0 K/uL   Monocytes Relative 8 %   Monocytes Absolute 0.8 0.1 - 1.0 K/uL   Eosinophils Relative 1 %   Eosinophils Absolute 0.1 0.0 - 0.5 K/uL   Basophils Relative 0 %   Basophils Absolute 0.0 0.0 - 0.1 K/uL   Immature Granulocytes 0 %   Abs Immature Granulocytes 0.04 0.00 - 0.07 K/uL    Comment: Performed at [facility], [address] [place], [place] [address]  Magnesium     Status: None   Collection Time: 01/04/24 11:50 AM  Result Value Ref Range   Magnesium 1.7 1.7 - 2.4 mg/dL    Comment: Performed at [facility] [place], [address] [place], [place] [address]  Glucose, capillary     Status: None   Collection Time: 01/04/24 11:59 AM  Result Value Ref Range   Glucose-Capillary 88 70 - 99 mg/dL    Comment: Glucose reference range applies only to samples taken after fasting for at least 8 hours.  Glucose, capillary     Status: Abnormal   Collection Time: 01/04/24  4:42 PM  Result Value Ref Range   Glucose-Capillary 119 (H) 70 - 99 mg/dL    Comment: Glucose reference range applies only to samples taken after fasting for at least 8 hours.  Glucose, capillary     Status: Abnormal   Collection Time: 01/04/24  8:19 PM  Result Value Ref Range   Glucose-Capillary 171 (H) 70 - 99 mg/dL    Comment: Glucose reference range applies only to samples taken after fasting for at least 8 hours.  Glucose, capillary     Status: Abnormal   Collection Time: 01/05/24  7:27 AM  Result  Value Ref Range   Glucose-Capillary 103 (H) 70 - 99 mg/dL    Comment: Glucose reference range applies only to samples taken after fasting for at least 8 hours.  CBC with Differential/Platelet     Status: Abnormal   Collection Time: 01/05/24 11:05 AM  Result Value Ref Range   WBC 6.0 4.0 - 10.5 K/uL   RBC 3.92 3.87  - 5.11 MIL/uL   Hemoglobin 11.9 (L) 12.0 - 15.0 g/dL   HCT 16.1 09.6 - 04.5 %   MCV 92.3 80.0 - 100.0 fL   MCH 30.4 26.0 - 34.0 pg   MCHC 32.9 30.0 - 36.0 g/dL   RDW 40.9 81.1 - 91.4 %   Platelets 245 150 - 400 K/uL   nRBC 0.0 0.0 - 0.2 %   Neutrophils Relative % 68 %   Neutro Abs 4.1 1.7 - 7.7 K/uL   Lymphocytes Relative 23 %   Lymphs Abs 1.3 0.7 - 4.0 K/uL   Monocytes Relative 7 %   Monocytes Absolute 0.4 0.1 - 1.0 K/uL   Eosinophils Relative 2 %   Eosinophils Absolute 0.1 0.0 - 0.5 K/uL   Basophils Relative 0 %   Basophils Absolute 0.0 0.0 - 0.1 K/uL   Immature Granulocytes 0 %   Abs Immature Granulocytes 0.01 0.00 - 0.07 K/uL    Comment: Performed at [facility], [address], [place], [place] [address]  Basic metabolic panel     Status: Abnormal   Collection Time: 01/05/24 11:05 AM  Result Value Ref Range   Sodium 135 135 - 145 mmol/L   Potassium 3.3 (L) 3.5 - 5.1 mmol/L   Chloride 101 98 - 111 mmol/L   CO2 24 22 - 32 mmol/L   Glucose, Bld 158 (H) 70 - 99 mg/dL    Comment: Glucose reference range applies only to samples taken after fasting for at least 8 hours.   BUN 12 8 - 23 mg/dL   Creatinine, Ser 6.21 (H) 0.44 - 1.00 mg/dL   Calcium 9.2 8.9 - 30.8 mg/dL   GFR, Estimated 33 (L) >60 mL/min    Comment: (NOTE) Calculated using the CKD-EPI Creatinine Equation (2021)    Anion gap 10 5 - 15    Comment: Performed at [facility], [address] [place], [place] [address]  Glucose, capillary     Status: Abnormal   Collection Time: 01/05/24 11:28 AM  Result Value Ref Range   Glucose-Capillary 134 (H) 70 - 99 mg/dL    Comment: Glucose reference range applies only to samples taken after fasting for at least 8 hours.  Glucose, capillary     Status: Abnormal   Collection Time: 01/05/24  4:19 PM  Result Value Ref Range   Glucose-Capillary 111 (H) 70 - 99 mg/dL    Comment: Glucose reference range applies only to samples taken after  fasting for at least 8 hours.  Glucose, capillary     Status: Abnormal   Collection Time: 01/05/24 10:45 PM  Result Value Ref Range   Glucose-Capillary 123 (H) 70 - 99 mg/dL    Comment: Glucose reference range applies only to samples taken after fasting for at least 8 hours.  CBC with Differential/Platelet     Status: Abnormal   Collection Time: 01/06/24  5:05 AM  Result Value Ref Range   WBC 5.1 4.0 - 10.5 K/uL   RBC 3.61 (L) 3.87 - 5.11 MIL/uL   Hemoglobin 11.0 (L) 12.0 - 15.0 g/dL  HCT 33.6 (L) 36.0 - 46.0 %   MCV 93.1 80.0 - 100.0 fL   MCH 30.5 26.0 - 34.0 pg   MCHC 32.7 30.0 - 36.0 g/dL   RDW 28.4 13.2 - 44.0 %   Platelets 240 150 - 400 K/uL   nRBC 0.0 0.0 - 0.2 %   Neutrophils Relative % 54 %   Neutro Abs 2.7 1.7 - 7.7 K/uL   Lymphocytes Relative 33 %   Lymphs Abs 1.7 0.7 - 4.0 K/uL   Monocytes Relative 11 %   Monocytes Absolute 0.6 0.1 - 1.0 K/uL   Eosinophils Relative 2 %   Eosinophils Absolute 0.1 0.0 - 0.5 K/uL   Basophils Relative 0 %   Basophils Absolute 0.0 0.0 - 0.1 K/uL   Immature Granulocytes 0 %   Abs Immature Granulocytes 0.02 0.00 - 0.07 K/uL    Comment: Performed at [facility], [address] [place], [place] [address]  Basic metabolic panel with GFR     Status: Abnormal   Collection Time: 01/06/24  5:05 AM  Result Value Ref Range   Sodium 138 135 - 145 mmol/L   Potassium 3.5 3.5 - 5.1 mmol/L   Chloride 106 98 - 111 mmol/L   CO2 26 22 - 32 mmol/L   Glucose, Bld 96 70 - 99 mg/dL    Comment: Glucose reference range applies only to samples taken after fasting for at least 8 hours.   BUN 8 8 - 23 mg/dL   Creatinine, Ser 5.36 (H) 0.44 - 1.00 mg/dL   Calcium 9.0 8.9 - 64.4 mg/dL   GFR, Estimated 47 (L) >60 mL/min    Comment: (NOTE) Calculated using the CKD-EPI Creatinine Equation (2021)    Anion gap 6 5 - 15    Comment: Performed at [facility], [address] [place], [place] [address]  Glucose, capillary      Status: Abnormal   Collection Time: 01/06/24  7:08 AM  Result Value Ref Range   Glucose-Capillary 125 (H) 70 - 99 mg/dL    Comment: Glucose reference range applies only to samples taken after fasting for at least 8 hours.  Glucose, capillary     Status: None   Collection Time: 01/06/24 10:32 AM  Result Value Ref Range   Glucose-Capillary 87 70 - 99 mg/dL    Comment: Glucose reference range applies only to samples taken after fasting for at least 8 hours.   Comment 1 Notify RN    Comment 2 Document in Chart     No results found.

## 2024-01-06 NOTE — Op Note (Signed)
 Alexandria Durham PROCEDURE DATE: 01/06/2024   PREOPERATIVE DIAGNOSIS:  Thickened endometrium, pelvic pain  POSTOPERATIVE DIAGNOSIS:  Same plus uterine perforation  PROCEDURE:  D&C, hysteroscopy, Pap smear, EUA, Diagnostic laparoscopy.   SURGEON:  Dr. [name]  ASSISTANT:  None  ANESTHESIA:  LMA converted to General endotracheal  COMPLICATIONS:  None immediate.  ESTIMATED BLOOD LOSS:  50 ml.  FLUIDS: 1300 ml LR.  URINE OUTPUT:  350 ml of clear urine.  INDICATIONS: 78 y.o. [id number] with thickened endometrium and pelvic pain despite antibiotics with recent EMB that was negative for hyperplasia/malignancy. She does not have a recent pap smear.     FINDINGS:  Cervix has a small brown growth on it about 1-2 mm in size. On hysteroscopy, at some point after I had performed curettings, there was a clear uterine perforation. Camera was advanced through this defect and minimal bleeding and no evidence of bowel injury. On laparoscopy, normal uterus, fallopian tubes, and ovaries. Small defect in anterior left side of the uterus with very little bleeding. Bowel inspected around this area and no evidence of bowel injury or trauma to surrounding area. Small bowel noted to be adherent in the periumbilical area close to my umbilical port. Otherwise, all findings within her abdominal survey were normal.   TECHNIQUE:  The patient was taken to the operating room where LMA anesthesia was obtained without difficulty.  She was then placed in the dorsal lithotomy position and prepared and draped in sterile fashion.  An adequate time out was performed.   An open-sided speculum was inserted into her vagina. Cervix was grasped with the single tooth tenaculum at the 12 oclock position. The cervix was easily dilated with the [facility] dilators. 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 I removed the camera and noted a  fluid deficit for 800 cc, however, the fluid was leaking throughout onto the floor as the bag was not set up correctly. We reset the device and machine at this time as there was no evidence of perforation at this time by hysteroscopy. I then performed an ECC and cervical biopsies. I then performed a gentle curetting with minimal tissue return. I then dilated the cervix with the 19 Pratt. I inserted the hysteroscope again and then noted a perforation at what appeared to be the top of the uterus (the fundus) - Fluid deficit for this portion of the procedure was 415 cc. Given it being unclear when the perforation was made, I made the decision to proceed with diagnostic laparoscopy. I collected a pap smear as well and placed a foley catheter.   The patient was then placed in dorsal supine position. She was prepared and draped in the normal sterile fashion. A skin incision was made with the 11 blade scalpel in the umbilicus. I entered her abdominal cavity with a veress needle using closed technique. Opening pressure was 6.  Pneumoperitoneum achieved to a pressure of 15.  The 5 mm optiview port was inserted into the abdomen under direct visualization. Below the point of entry and the pelvis was inspected with no evidence of injury however it appeared there was small bowel posterolateral to my port. At this point I placed a LLQ and RLQ 5mm port under direct visualization and my umbilical port was directly adjacent to some scarred small bowel. Intra-op general surgery consult was done. While awaiting general surgery, I inspected the perforation and suctioned out  the fluid from the pelvis (about 300 cc c/w the fluid deficit for the second part of the procedure). The surrounding bowel was inspected and no evidence of any surrounding injury. Slight ooze noted from the uterus but no active bleeding. Inspected posterior cul-de-sac and no abnormal findings noted and no injury.   At this point Dr. Andy Bannister arrived to inspect the  small bowel. Please refer to her operative note for details for her portion of the procedure.   After she left, I placed surgiseal over the perforation site and no bleeding noted. I removed the ports under direct visualization. Gas removed from the abdomen. Port sites injected with 0.25% marcaine for total of 10 cc (she had 5 total L/S incisions -- umbilical, LUQ, LLQ, suprapubic and RLQ). The incisions were closed with 4-0 vicryl in subcuticular fashion and the incisions were covered with dermabond.   I reviewed all postoperative findings and complications with the patient's daughter as well as Dr. Lorita Rosa. Reviewed postoperative recommendations at this time would be for doxcycline for 2 weeks and reevaluate with her regular GYN. Consider repeat D&C, hysteroscopy once healed from this perforation vs hysterectomy. Message also sent to Dr. Orvil Bland for her opinion.     [name], MD Attending Obstetrician & Gynecologist, [facility] for [facility], [facility]

## 2024-01-06 NOTE — Anesthesia Postprocedure Evaluation (Signed)
 Anesthesia Post Note  Patient: Alexandria Durham  Procedure(s) Performed: DILATATION AND CURETTAGE /HYSTEROSCOPY LAPAROSCOPY, DIAGNOSTIC (Abdomen) LYSIS, ADHESIONS, LAPAROSCOPIC (Abdomen)     Patient location during evaluation: PACU Anesthesia Type: General Level of consciousness: awake and alert Pain management: pain level controlled Vital Signs Assessment: post-procedure vital signs reviewed and stable Respiratory status: spontaneous breathing, nonlabored ventilation, respiratory function stable and patient connected to nasal cannula oxygen Cardiovascular status: blood pressure returned to baseline and stable Postop Assessment: no apparent nausea or vomiting Anesthetic complications: no  No notable events documented.  Last Vitals:  Vitals:   01/06/24 1515 01/06/24 1530  BP: (!) 148/63 (!) 146/80  Pulse: 85 87  Resp: 10 12  Temp:    SpO2: 96% 98%    Last Pain:  Vitals:   01/06/24 1530  TempSrc:   PainSc: 3                  Cherril Hett L Hayven Croy

## 2024-01-06 NOTE — Transfer of Care (Signed)
 Immediate Anesthesia Transfer of Care Note  Patient: Alexandria Durham  Procedure(s) Performed: DILATATION AND CURETTAGE /HYSTEROSCOPY LAPAROSCOPY, DIAGNOSTIC (Abdomen) LYSIS, ADHESIONS, LAPAROSCOPIC (Abdomen)  Patient Location: PACU  Anesthesia Type:General  Level of Consciousness: awake, alert , and oriented  Airway & Oxygen Therapy: Patient Spontanous Breathing and Patient connected to face mask oxygen  Post-op Assessment: Report given to RN and Post -op Vital signs reviewed and stable  Post vital signs: Reviewed and stable  Last Vitals:  Vitals Value Taken Time  BP 187/94 01/06/24 1433  Temp    Pulse 87 01/06/24 1437  Resp 15 01/06/24 1437  SpO2 100 % 01/06/24 1437  Vitals shown include unfiled device data.  Last Pain:  Vitals:   01/06/24 1032  TempSrc:   PainSc: 0-No pain      Patients Stated Pain Goal: 6 (01/06/24 1032)  Complications: No notable events documented.

## 2024-01-06 NOTE — Plan of Care (Signed)

## 2024-01-06 NOTE — Progress Notes (Signed)
 Progress Note   Patient: Alexandria Durham WGN:[id number] DOB: June 19, 1946 DOA: 01/02/2024     0 DOS: the patient was seen and examined on 01/06/2024   Brief hospital course: 81 with h/o stage 3a CKD, DM, HTN, HLD, and endometrial abnormality s/p recent biopsy who presented on 5/4 with abdominal pain and n/v.  Imaging with mild diverticulitis, treated with Ceftriaxone  and metronidazole .  Persistent significant abdominal TTP so surgery and then GYN were called.  Added doxy for possible endometritis.  Not improving after 24 hours so underwent D&C on 5/8, complicated by uterine perforation.  Assessment and Plan:  Acute descending colon/sigmoid diverticulitis Continue IV Rocephin  plus Flagyl  Tolerating clear diet, advanced to full and then asked to advance to soft diet Advanced possibly too quickly, got n/v and worsening pain so backed down again Due to lack of improvement with antibiotics, surgery and then GYN were consulted   Endometrial abnormality Worrisome for endometrial cancer Recent endometrial biopsy without evidence of hyperplasia or malignancy Discussed with GYN - recommend adding oral doxy on 5/8 to see if this helps for possible endometritis Underwent D&C with unfortunate complication of uterine perforation Needs prolonged course of doxycycline  x 14 days Pap, EMB, and endocervical curette were performed Follow-up with OB/GYN on discharge [name], MD) She may need an additional ultrasound-guided D&C and hysteroscopy vs. Hysterectomy if cervical tests are negative  Vitamin D deficiency Continue vitamin D repletion   Type 2 diabetes mellitus with diabetic polyneuropathy, without long-term current use of insulin  A1c 6.8, good control Continue Januvia Continue gabapentin, stop pregabalin (should not be taking both)   Postoperative hypothyroidism Continue levothyroxine at current dose Appears to be managed by her PCP and was normal 8 months ago   Mixed  hyperlipidemia/PVD Has been intolerant of statins Continue Zetia   Gastroesophageal reflux disease without esophagitis Continue PPI   Essential hypertension Continue losartan, hydrochlorothiazide Lopressor as needed   Stage 3a chronic kidney disease Appears to be stable at this time Attempt to avoid nephrotoxic medications Recheck BMP in AM     Mood d/o Continue bupropion         Consultants: None   Procedures: None   Antibiotics: Cefepime x 1 Ceftriaxone  5/5- Metronidazole  5/4- Doxycycline  5/7-        Subjective: Still having significant abdominal TTP, wants to have procedure to get this taken care of.   Objective: Vitals:   01/06/24 0924 01/06/24 1028  BP: (!) 143/69 (!) 168/75  Pulse: 69 76  Resp:  16  Temp:  97.9 F (36.6 C)  SpO2: 100% 97%    Intake/Output Summary (Last 24 hours) at 01/06/2024 1450 Last data filed at 01/06/2024 1437 Gross per 24 hour  Intake 2496.85 ml  Output 1150 ml  Net 1346.85 ml   Filed Weights   01/02/24 1327 01/02/24 2041 01/06/24 1032  Weight: 69.4 kg 71.3 kg 71.3 kg    Exam:  General:  Appears calm and comfortable and is in NAD Eyes:  EOMI, normal lids, iris ENT:  grossly normal hearing, lips & tongue, mmm Neck:  no LAD, masses or thyromegaly Cardiovascular:  RRR, no m/r/g. No LE edema.  Respiratory:   CTA bilaterally with no wheezes/rales/rhonchi.  Normal respiratory effort. Abdomen:  soft, moderately diffusely TTP, ND Skin:  no rash or induration seen on limited exam Musculoskeletal:  grossly normal tone BUE/BLE, good ROM, no bony abnormality Psychiatric:  grossly normal mood and affect, speech fluent and appropriate, AOx3 Neurologic:  CN 2-12 grossly intact, moves  all extremities in coordinated fashion  Data Reviewed: I have reviewed the patient's lab results since admission.  Pertinent labs for today include:  BUN 8/Creatinine 1.19/GFR 47, stable WBC 5.1 Hgb 11, down from 13 on 5/6    Family  Communication: None present; we spoke with her daughter by telephone in the room  Disposition: Status is: Inpatient Admit - It is my clinical opinion that admission to INPATIENT is reasonable and necessary because of the expectation that this patient will require hospital care that crosses at least 2 midnights to treat this condition based on the medical complexity of the problems presented.  Given the aforementioned information, the predictability of an adverse outcome is felt to be significant.      Time spent: 50 minutes  Unresulted Labs (From admission, onward)     Start     Ordered   01/07/24 0500  CBC with Differential/Platelet  Tomorrow morning,   R        01/06/24 1450   01/07/24 0500  Basic metabolic panel with GFR  Tomorrow morning,   R        01/06/24 1450             Author: Lorita Rosa, MD 01/06/2024 2:50 PM  For on call review [email or web address].

## 2024-01-06 NOTE — Anesthesia Procedure Notes (Signed)
 Procedure Name: LMA Insertion Date/Time: 01/06/2024 12:13 PM  Performed by: Jenene Miser, CRNAPre-anesthesia Checklist: Patient identified, Emergency Drugs available, Suction available and Patient being monitored Patient Re-evaluated:Patient Re-evaluated prior to induction Oxygen Delivery Method: Circle system utilized Preoxygenation: Pre-oxygenation with 100% oxygen Induction Type: IV induction LMA: LMA with gastric port inserted LMA Size: 4.0 Number of attempts: 1 Placement Confirmation: positive ETCO2 and breath sounds checked- equal and bilateral Tube secured with: Tape Dental Injury: Teeth and Oropharynx as per pre-operative assessment

## 2024-01-06 NOTE — Anesthesia Procedure Notes (Signed)
 Procedure Name: Intubation Date/Time: 01/06/2024 12:49 PM  Performed by: Jenene Miser, CRNAPre-anesthesia Checklist: Patient identified, Emergency Drugs available, Suction available and Patient being monitored Patient Re-evaluated:Patient Re-evaluated prior to induction Oxygen Delivery Method: Circle system utilized Preoxygenation: Pre-oxygenation with 100% oxygen Induction Type: IV induction Ventilation: Mask ventilation without difficulty Laryngoscope Size: Mac and 4 Grade View: Grade II Tube type: Oral Tube size: 7.0 mm Number of attempts: 1 Airway Equipment and Method: Stylet Placement Confirmation: ETT inserted through vocal cords under direct vision, positive ETCO2 and breath sounds checked- equal and bilateral Secured at: 21 cm Tube secured with: Tape Dental Injury: Teeth and Oropharynx as per pre-operative assessment

## 2024-01-06 NOTE — Op Note (Signed)
 01/02/2024 - 01/06/2024  2:07 PM  PATIENT:  Alexandria Durham  78 y.o. female  Patient Care Team: Bayard Limbo, NP as PCP - General (Nurse Practitioner)  PRE-OPERATIVE DIAGNOSIS:  THICKENED ENDOMETRIAL LINING  POST-OPERATIVE DIAGNOSIS:  THICKENED ENDOMETRIAL LINING  PROCEDURE:  LYSIS, ADHESIONS, LAPAROSCOPIC  SURGEON:  Surgeon(s): [name], MD Joyce Nixon, MD  ASSISTANT: Dr Vallarie Gauze   ANESTHESIA:   general  EBL: min Total I/O In: 1100 [I.V.:1000; IV Piggyback:100] Out: 500 [Urine:400; Blood:100]   INDICATION: Patient undergoing D&C with hysteroscopy.  Uterus was perforated and a diagnostic laparoscopy was performed to inspect for any other injury.   OR FINDINGS: Adhesions to the abdominal wall at the umbilicus.  Trocar site nearby but did not appear to penetrate the small bowel  DESCRIPTION: I was called to the OR for an urgent consult by Dr. Vallarie Gauze.  She was doing a diagnostic laparoscopy to evaluate a uterine perforation.  A trocar was placed at the umbilicus near an area of adhesed small bowel.  There was concern for perforation of the bowel and I was asked to evaluate.  We laparoscopically evaluated the abdomen.  The port appeared to be within 2 loops of adhesed small bowel at the umbilicus.  There was no active bleeding or drainage of GI contents.  A couple more 5 mm ports were placed for additional visualization.  The small bowel adhesions were taken down using laparoscopic scissors.  Both loops were evaluated thoroughly.  There were no full-thickness injuries noted.  There was no leak of GI contents or active bleeding noted.  The case was turned back over to Dr. Vallarie Gauze for completion.  Fernande Howells, MD  Colorectal and General Surgery [facility]

## 2024-01-06 NOTE — Progress Notes (Signed)
 Subjective: CC: Lower abdominal pain worse since I saw her yesterday.  NPO currently. No n/v. Non-bloody bm yesterday.   Afebrile. No tachycardia or hypotension. WBC wnl.   Objective: Vital signs in last 24 hours: Temp:  [97.4 F (36.3 C)-97.9 F (36.6 C)] 97.9 F (36.6 C) (05/08 0545) Pulse Rate:  [74-87] 85 (05/08 0545) Resp:  [17-18] 17 (05/08 0545) BP: (112-137)/(67-75) 112/71 (05/08 0545) SpO2:  [95 %-100 %] 95 % (05/08 0545) Last BM Date : 01/05/24  Intake/Output from previous day: 05/07 0701 - 05/08 0700 In: 2030.5 [P.O.:840; I.V.:874.4; IV Piggyback:316.1] Out: 950 [Urine:950] Intake/Output this shift: No intake/output data recorded.  PE: Gen:  Alert, NAD, pleasant Abd: Soft, mild distension, she is fairly ttp in the lower abdomen, +BS  Lab Results:  Recent Labs    01/05/24 1105 01/06/24 0505  WBC 6.0 5.1  HGB 11.9* 11.0*  HCT 36.2 33.6*  PLT 245 240   BMET Recent Labs    01/05/24 1105 01/06/24 0505  NA 135 138  K 3.3* 3.5  CL 101 106  CO2 24 26  GLUCOSE 158* 96  BUN 12 8  CREATININE 1.59* 1.19*  CALCIUM 9.2 9.0   PT/INR No results for input(s): "LABPROT", "INR" in the last 72 hours. CMP     Component Value Date/Time   NA 138 01/06/2024 0505   K 3.5 01/06/2024 0505   CL 106 01/06/2024 0505   CO2 26 01/06/2024 0505   GLUCOSE 96 01/06/2024 0505   BUN 8 01/06/2024 0505   CREATININE 1.19 (H) 01/06/2024 0505   CALCIUM 9.0 01/06/2024 0505   PROT 8.5 (H) 01/02/2024 1359   ALBUMIN 3.5 01/04/2024 1150   AST 19 01/02/2024 1359   ALT 29 01/02/2024 1359   ALKPHOS 70 01/02/2024 1359   BILITOT 0.4 01/02/2024 1359   GFRNONAA 47 (L) 01/06/2024 0505   GFRAA 51 (L) 03/08/2016 1618   Lipase     Component Value Date/Time   LIPASE 32 01/02/2024 1359    Studies/Results: No results found.  Anti-infectives: Anti-infectives (From admission, onward)    Start     Dose/Rate Route Frequency Ordered Stop   01/05/24 2200  doxycycline   (VIBRA -TABS) tablet 100 mg        100 mg Oral Every 12 hours 01/05/24 1511     01/03/24 0600  metroNIDAZOLE  (FLAGYL ) IVPB 500 mg        500 mg 100 mL/hr over 60 Minutes Intravenous Every 12 hours 01/02/24 2157     01/03/24 0200  cefTRIAXone  (ROCEPHIN ) 2 g in sodium chloride  0.9 % 100 mL IVPB        2 g 200 mL/hr over 30 Minutes Intravenous Every 24 hours 01/02/24 2157     01/02/24 1730  ceFEPIme (MAXIPIME) 2 g in sodium chloride  0.9 % 100 mL IVPB       Placed in "And" Linked Group   2 g 200 mL/hr over 30 Minutes Intravenous  Once 01/02/24 1727 01/02/24 1857   01/02/24 1730  metroNIDAZOLE  (FLAGYL ) IVPB 500 mg       Placed in "And" Linked Group   500 mg 100 mL/hr over 60 Minutes Intravenous  Once 01/02/24 1727 01/02/24 1948        Assessment/Plan Kourtnie Monestime is a 78 year old female who we are asked to see for diverticulitis.  She reports that her abdominal pain began most immediately following her endometrial biopsy that was done at a outside hospital on 4/29.  Her CT  scan that was done on 5/4 showed mild diverticulitis near the junction of the descending and sigmoid colon without evidence of perforation or abscess.  She is hemodynamically stable here without fever, tachycardia or hypotension.  Her WBC has normalized.  I do not think she needs emergency surgery at this time and agree w/ further w/u by GYN for possible endometritis following EMB. She is currently on appropriate abx that would cover her diverticulitis noted on CT 5/4 and GYN has added Doxy for possible endometritis coverage. GYN considering D&C, hysterscopy. If this w/u is inconclusive or negative would repeat CT A/P. Further recs to follow following w/u by GYN.    FEN - NPO till final GYN recs, IVF per primary  VTE - SCDs, okay for chem ppx from a general surgery standpoint ID - Rocephin /Flagyl /Doxy  I reviewed nursing notes, Consultant (GYN) notes, hospitalist notes, last 24 h vitals and pain scores, last 48 h intake and  output, last 24 h labs and trends, and last 24 h imaging results.   LOS: 0 days    Delton Filbert, [facility] 01/06/2024, 8:18 AM Please see Amion for pager number during day hours 7:00am-4:30pm

## 2024-01-07 ENCOUNTER — Encounter (HOSPITAL_COMMUNITY): Payer: Self-pay | Admitting: Obstetrics and Gynecology

## 2024-01-07 DIAGNOSIS — K5792 Diverticulitis of intestine, part unspecified, without perforation or abscess without bleeding: Secondary | ICD-10-CM | POA: Diagnosis not present

## 2024-01-07 LAB — CBC WITH DIFFERENTIAL/PLATELET
Abs Immature Granulocytes: 0.02 10*3/uL (ref 0.00–0.07)
Basophils Absolute: 0 10*3/uL (ref 0.0–0.1)
Basophils Relative: 0 %
Eosinophils Absolute: 0.1 10*3/uL (ref 0.0–0.5)
Eosinophils Relative: 1 %
HCT: 33.9 % — ABNORMAL LOW (ref 36.0–46.0)
Hemoglobin: 10.8 g/dL — ABNORMAL LOW (ref 12.0–15.0)
Immature Granulocytes: 0 %
Lymphocytes Relative: 27 %
Lymphs Abs: 1.5 10*3/uL (ref 0.7–4.0)
MCH: 29.8 pg (ref 26.0–34.0)
MCHC: 31.9 g/dL (ref 30.0–36.0)
MCV: 93.6 fL (ref 80.0–100.0)
Monocytes Absolute: 0.5 10*3/uL (ref 0.1–1.0)
Monocytes Relative: 9 %
Neutro Abs: 3.6 10*3/uL (ref 1.7–7.7)
Neutrophils Relative %: 63 %
Platelets: 253 10*3/uL (ref 150–400)
RBC: 3.62 MIL/uL — ABNORMAL LOW (ref 3.87–5.11)
RDW: 14.5 % (ref 11.5–15.5)
WBC: 5.7 10*3/uL (ref 4.0–10.5)
nRBC: 0 % (ref 0.0–0.2)

## 2024-01-07 LAB — BASIC METABOLIC PANEL WITH GFR
Anion gap: 8 (ref 5–15)
BUN: 6 mg/dL — ABNORMAL LOW (ref 8–23)
CO2: 23 mmol/L (ref 22–32)
Calcium: 8.5 mg/dL — ABNORMAL LOW (ref 8.9–10.3)
Chloride: 104 mmol/L (ref 98–111)
Creatinine, Ser: 1.06 mg/dL — ABNORMAL HIGH (ref 0.44–1.00)
GFR, Estimated: 54 mL/min — ABNORMAL LOW (ref >60)
Glucose, Bld: 103 mg/dL — ABNORMAL HIGH (ref 70–99)
Potassium: 3.4 mmol/L — ABNORMAL LOW (ref 3.5–5.1)
Sodium: 135 mmol/L (ref 135–145)

## 2024-01-07 LAB — GLUCOSE, CAPILLARY
Glucose-Capillary: 99 mg/dL (ref 70–99)
Glucose-Capillary: 135 mg/dL — ABNORMAL HIGH (ref 70–99)
Glucose-Capillary: 106 mg/dL — ABNORMAL HIGH (ref 70–99)
Glucose-Capillary: 145 mg/dL — ABNORMAL HIGH (ref 70–99)

## 2024-01-07 MED ORDER — CALCIUM POLYCARBOPHIL 625 MG PO TABS
625.0000 mg | ORAL_TABLET | Freq: Two times a day (BID) | ORAL | Status: DC
  Administered 2024-01-07 – 2024-01-09 (×5): 625 mg via ORAL
  Filled 2024-01-07 (×5): qty 1

## 2024-01-07 MED ORDER — OXYCODONE HCL 5 MG PO TABS
5.0000 mg | ORAL_TABLET | ORAL | Status: DC | PRN
  Administered 2024-01-07 – 2024-01-09 (×3): 5 mg via ORAL
  Filled 2024-01-07 (×3): qty 1

## 2024-01-07 MED ORDER — POTASSIUM CHLORIDE CRYS ER 20 MEQ PO TBCR
40.0000 meq | EXTENDED_RELEASE_TABLET | Freq: Once | ORAL | Status: AC
  Administered 2024-01-07: 40 meq via ORAL
  Filled 2024-01-07: qty 2

## 2024-01-07 MED ORDER — ACETAMINOPHEN 500 MG PO TABS
500.0000 mg | ORAL_TABLET | Freq: Three times a day (TID) | ORAL | Status: DC
  Administered 2024-01-07 – 2024-01-09 (×9): 500 mg via ORAL
  Filled 2024-01-07 (×9): qty 1

## 2024-01-07 NOTE — Progress Notes (Signed)
 Progress Note   Patient: Alexandria Durham WGN:[id number] DOB: [birth year] DOA: 01/02/2024     1 DOS: the patient was seen and examined on 01/07/2024   Brief hospital course: 38 with h/o stage 3a CKD, DM, HTN, HLD, and endometrial abnormality s/p recent biopsy who presented on 5/4 with abdominal pain and n/v.  Imaging with mild diverticulitis, treated with Ceftriaxone  and metronidazole .  Persistent significant abdominal TTP so surgery and then GYN were called.  Added doxy for possible endometritis.  Not improving after 24 hours so underwent D&C on 5/8, complicated by uterine perforation.  Assessment and Plan:  Endometrial abnormality Worrisome for endometrial cancer Recent endometrial biopsy without evidence of hyperplasia or malignancy Discussed with GYN - recommend adding oral doxy on 5/8 to see if this helps for possible endometritis Underwent D&C with unfortunate complication of uterine perforation Needs prolonged course of doxycycline  x 14 days Pap, EMB, and endocervical curette were performed Follow-up with OB/GYN on discharge [name], MD) She may need an additional ultrasound-guided D&C and hysteroscopy vs. Hysterectomy if cervical tests are negative  Acute descending colon/sigmoid diverticulitis Continue IV Rocephin  plus Flagyl  Tolerating clear diet, advanced to full and then asked to advance to soft diet Advanced possibly too quickly, got n/v and worsening pain so backed down again Due to lack of improvement with antibiotics, surgery and then GYN were consulted   Vitamin D  deficiency Continue vitamin D  repletion   Type 2 diabetes mellitus with diabetic polyneuropathy, without long-term current use of insulin   A1c 6.8, good control Continue Januvia Continue gabapentin , stop pregabalin (should not be taking both)   Postoperative hypothyroidism Continue levothyroxine  at current dose Appears to be managed by her PCP and was normal 8 months ago   Mixed  hyperlipidemia/PVD Has been intolerant of statins Continue Zetia    Gastroesophageal reflux disease without esophagitis Continue PPI   Essential hypertension Continue losartan , hydrochlorothiazide  Lopressor  as needed   Stage 3a chronic kidney disease Appears to be stable at this time Attempt to avoid nephrotoxic medications Recheck BMP in AM     Mood d/o Continue bupropion          Consultants: None   Procedures: None   Antibiotics: Cefepime  x 1 Ceftriaxone  5/5- Metronidazole  5/4- Doxycycline  5/7-      30 Day Unplanned Readmission Risk Score    Flowsheet Row ED to Hosp-Admission (Current) from 01/02/2024 in [facility] [place] [facility]  30 Day Unplanned Readmission Risk Score (%) 18.87 Filed at 01/07/2024 0401       This score is the patient's risk of an unplanned readmission within 30 days of being discharged (0 -100%). The score is based on dignosis, age, lab data, medications, orders, and past utilization.   Low:  0-14.9   Medium: 15-21.9   High: 22-29.9   Extreme: 30 and above           Subjective: Still having significant abdominal pain.  N/V this AM so diet was scaled back.  + BMs.   Objective: Vitals:   01/07/24 0534 01/07/24 1336  BP: (!) 142/64 139/60  Pulse: 82 91  Resp: 18 18  Temp: (!) 97.3 F (36.3 C) 98 F (36.7 C)  SpO2: 96% 96%    Intake/Output Summary (Last 24 hours) at 01/07/2024 1652 Last data filed at 01/07/2024 1400 Gross per 24 hour  Intake 997.61 ml  Output 300 ml  Net 697.61 ml   Filed Weights   01/02/24 1327 01/02/24 2041 01/06/24 1032  Weight: 69.4 kg 71.3 kg 71.3  kg    Exam:  General:  Appears calm and comfortable and is in NAD Eyes:  EOMI, normal lids, iris ENT:  grossly normal hearing, lips & tongue, mmm Neck:  no LAD, masses or thyromegaly Cardiovascular:  RRR, no m/r/g. No LE edema.  Respiratory:   CTA bilaterally with no wheezes/rales/rhonchi.  Normal respiratory effort. Abdomen:  soft, significantly  diffusely TTP, ND Skin:  no rash or induration seen on limited exam Musculoskeletal:  grossly normal tone BUE/BLE, good ROM, no bony abnormality Psychiatric:  grossly normal mood and affect, speech fluent and appropriate, AOx3 Neurologic:  CN 2-12 grossly intact, moves all extremities in coordinated fashion  Data Reviewed: I have reviewed the patient's lab results since admission.  Pertinent labs for today include:   K+ 3.4 Glucose 103 BUN 6/Creatinine 1.06/GFR 54, stable, improving WBC 5.7 Hgb 10.8     Family Communication: None present  Disposition: Status is: Inpatient Remains inpatient appropriate because: ongoing monitoring     Time spent: 50 minutes  Unresulted Labs (From admission, onward)     Start     Ordered   01/08/24 0500  CBC with Differential/Platelet  Tomorrow morning,   R        01/07/24 1652   01/08/24 0500  Basic metabolic panel with GFR  Tomorrow morning,   R        01/07/24 1652             Author: Lorita Rosa, MD 01/07/2024 4:52 PM  For on call review [email or web address].

## 2024-01-07 NOTE — Progress Notes (Signed)
 01/07/2024  Alexandria Durham [id number] 1946-04-01  CARE TEAM: PCP: Bayard Limbo, NP  Outpatient Care Team: Patient Care Team: Bayard Limbo, NP as PCP - General (Nurse Practitioner)  Inpatient Treatment Team: Treatment Team:  Lorita Rosa, MD Arley Bending, RN Harrell Lima, MD [facility], Md, MD Gae Jointer, RN Spurgeon Dyer, RPH Kusnitz, Lacinda Pica, RN Holley Lung, NT Kristine Phalen, RN   Problem List:   Principal Problem:   Diverticulitis Active Problems:   Stage 3a chronic kidney disease [facility])   Essential hypertension   Gastroesophageal reflux disease without esophagitis   Mixed hyperlipidemia   Postoperative hypothyroidism   PVD (peripheral vascular disease) (HCC)   Type 2 diabetes mellitus (HCC)   Type 2 diabetes mellitus with diabetic polyneuropathy, without long-term current use of insulin  (HCC)   Vitamin D  deficiency   Abdominal pain   Thickened endometrium   Uterine perforation  01/06/2024  POST-OPERATIVE DIAGNOSIS:  THICKENED ENDOMETRIAL LINING   PROCEDURE:  LAPAROSCOPIC LYSIS OF ADHESIONS   SURGEON:  Joyce Nixon, MD  OR FINDINGS: Adhesions to the abdominal wall at the umbilicus.  Trocar site nearby but did not appear to penetrate the small bowel     01/06/2024  PREOPERATIVE DIAGNOSIS:  Thickened endometrium, pelvic pain   POSTOPERATIVE DIAGNOSIS:  Same plus uterine perforation   PROCEDURE:  D&C, hysteroscopy, Pap smear, EUA, Diagnostic laparoscopy.    SURGEON:  Dr. [name]  FINDINGS:   Cervix has a small brown growth on it about 1-2 mm in size. On hysteroscopy, at some point after I had performed curettings, there was a clear uterine perforation. Camera was advanced through this defect and minimal bleeding and no evidence of bowel injury. On laparoscopy, normal uterus, fallopian tubes, and ovaries. Small defect in anterior left side of the uterus with very little bleeding. Bowel inspected around this area and no evidence of  bowel injury or trauma to surrounding area. Small bowel noted to be adherent in the periumbilical area close to my umbilical port. Otherwise, all findings within her abdominal survey were normal.      Assessment [facility] Stay = 1 days) 1 Day Post-Op    Stable    Plan:  -Tolerating liquids.  Follow diet as tolerated.  Multimodal pain control.  Made some low-dose scheduled Tylenol .  Encouraged her to try oral meds.  She thinks hydrocodone  is helping.  Bowel regimen.  -monitor electrolytes & replace as needed  Keep K>4, Mg>2, Phos>3  -VTE prophylaxis- SCDs.  Anticoagulation prophyllaxis SQ as appropriate  -mobilize as tolerated to help recovery.  Enlist therapies in moderate/high risk patients as appropriate  I updated the patient's status to the patient and nurse  Recommendations were made.  Questions were answered.  They expressed understanding & appreciation.  -Disposition: To be determined by primary service.  Okay to discharge from surgery standpoint if tolerates solid diet later today.      I reviewed nursing notes, last 24 h vitals and pain scores, last 48 h intake and output, last 24 h labs and trends, and last 24 h imaging results.  I have reviewed this patient's available data, including medical history, events of note, test results, etc as part of my evaluation.   A significant portion of that time was spent in counseling. Care during the described time interval was provided by me.  This care required moderate level of medical decision making.  01/07/2024    Subjective: (Chief complaint)  No major events last night.  Some  soreness.  Trying cortisone.  Tolerating liquids.  No nausea or vomiting.  Objective:  Vital signs:  Vitals:   01/06/24 1750 01/06/24 2119 01/07/24 0127 01/07/24 0534  BP: (!) 153/75 (!) 146/62 (!) 131/59 (!) 142/64  Pulse: 92 90 87 82  Resp:  18 18 18   Temp: (!) 97.4 F (36.3 C) 98.1 F (36.7 C) 98.1 F (36.7 C) (!) 97.3 F  (36.3 C)  TempSrc: Oral Oral Oral Oral  SpO2: 95% 94% 95% 96%  Weight:      Height:        Last BM Date : 01/05/24  Intake/Output   Yesterday:  05/08 0701 - 05/09 0700 In: 2042.6 [P.O.:340; I.V.:1339.7; IV Piggyback:362.9] Out: 1300 [Urine:1250; Blood:50] This shift:  No intake/output data recorded.  Bowel function:  Flatus: YES  BM:  No  Drain:    Physical Exam:  General: Pt awake/alert in no acute distress Eyes: PERRL, normal EOM.  Sclera clear.  No icterus Neuro: CN II-XII intact w/o focal sensory/motor deficits. Lymph: No head/neck/groin lymphadenopathy Psych:  No delerium/psychosis/paranoia.  Oriented x 4 HENT: Normocephalic, Mucus membranes moist.  No thrush Neck: Supple, No tracheal deviation.  No obvious thyromegaly Chest: No pain to chest wall compression.  Good respiratory excursion.  No audible wheezing CV:  Pulses intact.  Regular rhythm.  No major extremity edema MS: Normal AROM mjr joints.  No obvious deformity  Abdomen: Soft.  Nondistended.  Mildly tender at incisions only.  No evidence of peritonitis.  No incarcerated hernias.  Ext:   No deformity.  No mjr edema.  No cyanosis Skin: No petechiae / purpurea.  No major sores.  Warm and dry    Results:   Cultures: No results found for this or any previous visit (from the past 720 hours).  Labs: Results for orders placed or performed during the hospital encounter of 01/02/24 (from the past 48 hours)  Glucose, capillary     Status: Abnormal   Collection Time: 01/05/24  7:27 AM  Result Value Ref Range   Glucose-Capillary 103 (H) 70 - 99 mg/dL    Comment: Glucose reference range applies only to samples taken after fasting for at least 8 hours.  CBC with Differential/Platelet     Status: Abnormal   Collection Time: 01/05/24 11:05 AM  Result Value Ref Range   WBC 6.0 4.0 - 10.5 K/uL   RBC 3.92 3.87 - 5.11 MIL/uL   Hemoglobin 11.9 (L) 12.0 - 15.0 g/dL   HCT 62.1 30.8 - 65.7 %   MCV 92.3 80.0 -  100.0 fL   MCH 30.4 26.0 - 34.0 pg   MCHC 32.9 30.0 - 36.0 g/dL   RDW 84.6 96.2 - 95.2 %   Platelets 245 150 - 400 K/uL   nRBC 0.0 0.0 - 0.2 %   Neutrophils Relative % 68 %   Neutro Abs 4.1 1.7 - 7.7 K/uL   Lymphocytes Relative 23 %   Lymphs Abs 1.3 0.7 - 4.0 K/uL   Monocytes Relative 7 %   Monocytes Absolute 0.4 0.1 - 1.0 K/uL   Eosinophils Relative 2 %   Eosinophils Absolute 0.1 0.0 - 0.5 K/uL   Basophils Relative 0 %   Basophils Absolute 0.0 0.0 - 0.1 K/uL   Immature Granulocytes 0 %   Abs Immature Granulocytes 0.01 0.00 - 0.07 K/uL    Comment: Performed at [facility], [address] [place], [place] [address]  Basic metabolic panel     Status: Abnormal  Collection Time: 01/05/24 11:05 AM  Result Value Ref Range   Sodium 135 135 - 145 mmol/L   Potassium 3.3 (L) 3.5 - 5.1 mmol/L   Chloride 101 98 - 111 mmol/L   CO2 24 22 - 32 mmol/L   Glucose, Bld 158 (H) 70 - 99 mg/dL    Comment: Glucose reference range applies only to samples taken after fasting for at least 8 hours.   BUN 12 8 - 23 mg/dL   Creatinine, Ser 1.61 (H) 0.44 - 1.00 mg/dL   Calcium 9.2 8.9 - 09.6 mg/dL   GFR, Estimated 33 (L) >60 mL/min    Comment: (NOTE) Calculated using the CKD-EPI Creatinine Equation (2021)    Anion gap 10 5 - 15    Comment: Performed at [facility], [address], [place], [place] [address]  Glucose, capillary     Status: Abnormal   Collection Time: 01/05/24 11:28 AM  Result Value Ref Range   Glucose-Capillary 134 (H) 70 - 99 mg/dL    Comment: Glucose reference range applies only to samples taken after fasting for at least 8 hours.  Glucose, capillary     Status: Abnormal   Collection Time: 01/05/24  4:19 PM  Result Value Ref Range   Glucose-Capillary 111 (H) 70 - 99 mg/dL    Comment: Glucose reference range applies only to samples taken after fasting for at least 8 hours.  Glucose, capillary     Status: Abnormal   Collection Time: 01/05/24  10:45 PM  Result Value Ref Range   Glucose-Capillary 123 (H) 70 - 99 mg/dL    Comment: Glucose reference range applies only to samples taken after fasting for at least 8 hours.  CBC with Differential/Platelet     Status: Abnormal   Collection Time: 01/06/24  5:05 AM  Result Value Ref Range   WBC 5.1 4.0 - 10.5 K/uL   RBC 3.61 (L) 3.87 - 5.11 MIL/uL   Hemoglobin 11.0 (L) 12.0 - 15.0 g/dL   HCT 98.1 (L) 19.1 - 47.8 %   MCV 93.1 80.0 - 100.0 fL   MCH 30.5 26.0 - 34.0 pg   MCHC 32.7 30.0 - 36.0 g/dL   RDW 29.5 62.1 - 30.8 %   Platelets 240 150 - 400 K/uL   nRBC 0.0 0.0 - 0.2 %   Neutrophils Relative % 54 %   Neutro Abs 2.7 1.7 - 7.7 K/uL   Lymphocytes Relative 33 %   Lymphs Abs 1.7 0.7 - 4.0 K/uL   Monocytes Relative 11 %   Monocytes Absolute 0.6 0.1 - 1.0 K/uL   Eosinophils Relative 2 %   Eosinophils Absolute 0.1 0.0 - 0.5 K/uL   Basophils Relative 0 %   Basophils Absolute 0.0 0.0 - 0.1 K/uL   Immature Granulocytes 0 %   Abs Immature Granulocytes 0.02 0.00 - 0.07 K/uL    Comment: Performed at [facility], [address] [place], [place] [address]  Basic metabolic panel with GFR     Status: Abnormal   Collection Time: 01/06/24  5:05 AM  Result Value Ref Range   Sodium 138 135 - 145 mmol/L   Potassium 3.5 3.5 - 5.1 mmol/L   Chloride 106 98 - 111 mmol/L   CO2 26 22 - 32 mmol/L   Glucose, Bld 96 70 - 99 mg/dL    Comment: Glucose reference range applies only to samples taken after fasting for at least 8 hours.   BUN 8 8 - 23  mg/dL   Creatinine, Ser 1.61 (H) 0.44 - 1.00 mg/dL   Calcium 9.0 8.9 - 09.6 mg/dL   GFR, Estimated 47 (L) >60 mL/min    Comment: (NOTE) Calculated using the CKD-EPI Creatinine Equation (2021)    Anion gap 6 5 - 15    Comment: Performed at [facility], [address] [place], [place] [address]  Glucose, capillary     Status: Abnormal   Collection Time: 01/06/24  7:08 AM  Result Value Ref Range   Glucose-Capillary  125 (H) 70 - 99 mg/dL    Comment: Glucose reference range applies only to samples taken after fasting for at least 8 hours.  Glucose, capillary     Status: None   Collection Time: 01/06/24 10:32 AM  Result Value Ref Range   Glucose-Capillary 87 70 - 99 mg/dL    Comment: Glucose reference range applies only to samples taken after fasting for at least 8 hours.   Comment 1 Notify RN    Comment 2 Document in Chart   Glucose, capillary     Status: Abnormal   Collection Time: 01/06/24 12:29 PM  Result Value Ref Range   Glucose-Capillary 105 (H) 70 - 99 mg/dL    Comment: Glucose reference range applies only to samples taken after fasting for at least 8 hours.   Comment 1 Document in Chart   Glucose, capillary     Status: Abnormal   Collection Time: 01/06/24  2:40 PM  Result Value Ref Range   Glucose-Capillary 133 (H) 70 - 99 mg/dL    Comment: Glucose reference range applies only to samples taken after fasting for at least 8 hours.  Glucose, capillary     Status: Abnormal   Collection Time: 01/06/24  4:25 PM  Result Value Ref Range   Glucose-Capillary 147 (H) 70 - 99 mg/dL    Comment: Glucose reference range applies only to samples taken after fasting for at least 8 hours.  Glucose, capillary     Status: Abnormal   Collection Time: 01/06/24  9:15 PM  Result Value Ref Range   Glucose-Capillary 102 (H) 70 - 99 mg/dL    Comment: Glucose reference range applies only to samples taken after fasting for at least 8 hours.  CBC with Differential/Platelet     Status: Abnormal   Collection Time: 01/07/24  4:31 AM  Result Value Ref Range   WBC 5.7 4.0 - 10.5 K/uL   RBC 3.62 (L) 3.87 - 5.11 MIL/uL   Hemoglobin 10.8 (L) 12.0 - 15.0 g/dL   HCT 98.1 (L) 19.1 - 47.8 %   MCV 93.6 80.0 - 100.0 fL   MCH 29.8 26.0 - 34.0 pg   MCHC 31.9 30.0 - 36.0 g/dL   RDW 29.5 62.1 - 30.8 %   Platelets 253 150 - 400 K/uL   nRBC 0.0 0.0 - 0.2 %   Neutrophils Relative % 63 %   Neutro Abs 3.6 1.7 - 7.7 K/uL    Lymphocytes Relative 27 %   Lymphs Abs 1.5 0.7 - 4.0 K/uL   Monocytes Relative 9 %   Monocytes Absolute 0.5 0.1 - 1.0 K/uL   Eosinophils Relative 1 %   Eosinophils Absolute 0.1 0.0 - 0.5 K/uL   Basophils Relative 0 %   Basophils Absolute 0.0 0.0 - 0.1 K/uL   Immature Granulocytes 0 %   Abs Immature Granulocytes 0.02 0.00 - 0.07 K/uL    Comment: Performed at [facility], [address] [place],  [place] [address]  Basic metabolic panel with GFR     Status: Abnormal   Collection Time: 01/07/24  4:31 AM  Result Value Ref Range   Sodium 135 135 - 145 mmol/L   Potassium 3.4 (L) 3.5 - 5.1 mmol/L   Chloride 104 98 - 111 mmol/L   CO2 23 22 - 32 mmol/L   Glucose, Bld 103 (H) 70 - 99 mg/dL    Comment: Glucose reference range applies only to samples taken after fasting for at least 8 hours.   BUN 6 (L) 8 - 23 mg/dL   Creatinine, Ser 0.98 (H) 0.44 - 1.00 mg/dL   Calcium 8.5 (L) 8.9 - 10.3 mg/dL   GFR, Estimated 54 (L) >60 mL/min    Comment: (NOTE) Calculated using the CKD-EPI Creatinine Equation (2021)    Anion gap 8 5 - 15    Comment: Performed at [facility], [address], [place], [place] [address]    Imaging / Studies: No results found.  Medications / Allergies: per chart  Antibiotics: Anti-infectives (From admission, onward)    Start     Dose/Rate Route Frequency Ordered Stop   01/06/24 1315  ceFAZolin  (ANCEF ) IVPB 2g/100 mL premix        2 g 200 mL/hr over 30 Minutes Intravenous  Once 01/06/24 1312 01/06/24 1309   01/05/24 2200  doxycycline  (VIBRA -TABS) tablet 100 mg        100 mg Oral Every 12 hours 01/05/24 1511     01/03/24 0600  metroNIDAZOLE  (FLAGYL ) IVPB 500 mg        500 mg 100 mL/hr over 60 Minutes Intravenous Every 12 hours 01/02/24 2157     01/03/24 0200  cefTRIAXone  (ROCEPHIN ) 2 g in sodium chloride  0.9 % 100 mL IVPB        2 g 200 mL/hr over 30 Minutes Intravenous Every 24 hours 01/02/24 2157     01/02/24 1730   ceFEPIme  (MAXIPIME ) 2 g in sodium chloride  0.9 % 100 mL IVPB       Placed in "And" Linked Group   2 g 200 mL/hr over 30 Minutes Intravenous  Once 01/02/24 1727 01/02/24 1857   01/02/24 1730  metroNIDAZOLE  (FLAGYL ) IVPB 500 mg       Placed in "And" Linked Group   500 mg 100 mL/hr over 60 Minutes Intravenous  Once 01/02/24 1727 01/02/24 1948         Note: Portions of this report may have been transcribed using voice recognition software. Every effort was made to ensure accuracy; however, inadvertent computerized transcription errors may be present.   Any transcriptional errors that result from this process are unintentional.    Eddye Goodie, MD, FACS, MASCRS Esophageal, Gastrointestinal & Colorectal Surgery Robotic and Minimally Invasive Surgery  [facility] [address] [place], [place] [address] [phone] Fax [phone] Main  CONTACT INFORMATION: Weekday (9AM-5PM): Call [facility] main office at [phone] Weeknight (5PM-9AM) or Weekend/Holiday: Check EPIC "Web Links" tab & use "AMION" (password " [email or web address]") for General Surgery [facility] coverage  Please, DO NOT use SecureChat  (it is not reliable communication to reach operating surgeons & will lead to a delay in care).   Epic staff messaging available for outptient concerns needing 1-2 business day response.      01/07/2024  7:18 AM

## 2024-01-07 NOTE — Progress Notes (Signed)
 Gynecology Progress Note  Admission Date: 01/02/2024 Current Date: 01/07/2024 3:17 PM  Alexandria Durham is a 78 y.o. [phone] admitted for N/V abdominal pain with thickened endometrium with fluid. Recent EMB in April was negative for hyperplasia and malignancy.   History complicated by: Patient Active Problem List   Diagnosis Date Noted   Abdominal pain 01/06/2024   Thickened endometrium 01/06/2024   Uterine perforation 01/06/2024   Diverticulitis 01/02/2024   Acute idiopathic gout of left foot 12/23/2023   Avascular necrosis of bone of hip, left (HCC) 12/23/2023   Carpal tunnel syndrome of left wrist 09/20/2019   Bilateral carotid artery stenosis 05/10/2019   PVD (peripheral vascular disease) (HCC) 05/10/2019   Lumbar radiculopathy 07/06/2018   Lumbar spondylosis 07/06/2018   Stage 3a chronic kidney disease (HCC) 06/06/2018   Mixed hyperlipidemia 02/24/2018   Cervical radiculopathy 09/29/2017   Former smoker 05/06/2017   Postoperative hypothyroidism 04/14/2017   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   Cortical age-related cataract of both eyes 11/25/2016   Asymmetric septal hypertrophy 04/30/2016   Gastroesophageal reflux disease without esophagitis 03/02/2016   Cardiac murmur, unspecified 02/24/2016   Benign hypertension with CKD (chronic kidney disease) stage III (HCC) 01/01/2016   Vitamin D  deficiency 12/31/2015   Essential hypertension 11/21/2015   Eczema 08/28/2015   Type 2 diabetes mellitus (HCC) 08/28/2015   Type 2 diabetes mellitus with diabetic polyneuropathy, without long-term current use of insulin  (HCC) 08/28/2015    ROS and patient/family/surgical history, located on admission H&P note dated 01/02/2024, have been reviewed, and there are no changes except as noted below Yesterday/Overnight Events:  POD#1 s/p D&C, hysteroscopy, pap c/b uterine perf >> Dx laparoscopy, LOA  Subjective:  Patient doing okay today. Still reports ongoing emesis but unchanged from before surgery.  Reports mild pain. Incisions are not bothering her.   Objective:     24h Vital Sign Ranges  T 98 F (36.7 C) Temp  Avg: 97.8 F (36.6 C)  Min: 97.3 F (36.3 C)  Max: 98.1 F (36.7 C)  BP 139/60 BP  Min: 131/59  Max: 169/71  HR 91 Pulse  Avg: 89  Min: 82  Max: 93  RR 18 Resp  Avg: 16.8  Min: 12  Max: 18  SaO2 96 % Room Air SpO2  Avg: 95.8 %  Min: 94 %  Max: 98 %       24 Hour I/O Current Shift I/O  Time Ins Outs 05/08 0701 - 05/09 0700 In: 2042.6 [P.O.:340; I.V.:1339.7] Out: 1300 [Urine:1250] 05/09 0701 - 05/09 1900 In: 340 [P.O.:340] Out: -     Patient Vitals for the past 24 hrs:  BP Temp Temp src Pulse Resp SpO2  01/07/24 1336 139/60 98 F (36.7 C) Oral 91 18 96 %  01/07/24 0534 (!) 142/64 (!) 97.3 F (36.3 C) Oral 82 18 96 %  01/07/24 0127 (!) 131/59 98.1 F (36.7 C) Oral 87 18 95 %  01/06/24 2119 (!) 146/62 98.1 F (36.7 C) Oral 90 18 94 %  01/06/24 1750 (!) 153/75 (!) 97.4 F (36.3 C) Oral 92 -- 95 %  01/06/24 1716 (!) 169/71 -- -- 93 -- 97 %  01/06/24 1617 (!) 149/90 -- -- 90 -- 95 %  01/06/24 1530 (!) 146/80 -- -- 87 12 98 %    Physical exam: General appearance: alert, cooperative, and appears stated age Abdomen: normal findings: bowel sounds normal, no masses palpable, no organomegaly, and soft and abnormal findings:  mildly tender to palpation GU: Not examined  Lungs: Normal effort Heart: Regular rate Extremities: no lower extremity edema Incisions: healing well, dermabond doing well.  Psych: appropriate Neurologic: Grossly normal  Medications Current Facility-Administered Medications  Medication Dose Route Frequency Provider Last Rate Last Admin   acetaminophen  (TYLENOL ) tablet 500 mg  500 mg Oral TID WC & HS Candyce Champagne, MD   500 mg at 01/07/24 1200   buPROPion  (WELLBUTRIN  XL) 24 hr tablet 150 mg  150 mg Oral QPM Pratt, Tanya S, MD   150 mg at 01/06/24 1714   cefTRIAXone  (ROCEPHIN ) 2 g in sodium chloride  0.9 % 100 mL IVPB  2 g Intravenous Q24H  Pratt, Tanya S, MD 200 mL/hr at 01/07/24 0254 2 g at 01/07/24 0254   cholecalciferol  (VITAMIN D3) 25 MCG (1000 UNIT) tablet 1,000 Units  1,000 Units Oral Daily Pratt, Tanya S, MD   1,000 Units at 01/07/24 1610   doxycycline  (VIBRA -TABS) tablet 100 mg  100 mg Oral Q12H Yates, Jennifer, MD   100 mg at 01/07/24 0958   enoxaparin  (LOVENOX ) injection 40 mg  40 mg Subcutaneous Q24H Scheryl Cushing, RPH   40 mg at 01/07/24 9604   ezetimibe  (ZETIA ) tablet 10 mg  10 mg Oral Daily Pratt, Tanya S, MD   10 mg at 01/07/24 0957   fentaNYL  (SUBLIMAZE ) injection 12.5-50 mcg  12.5-50 mcg Intravenous Q2H PRN Pratt, Tanya S, MD   50 mcg at 01/06/24 1759   fluticasone  (FLONASE ) 50 MCG/ACT nasal spray 2 spray  2 spray Each Nare Daily Granville Layer, MD   2 spray at 01/07/24 1019   gabapentin  (NEURONTIN ) capsule 300 mg  300 mg Oral TID Pratt, Tanya S, MD   300 mg at 01/07/24 0957   hydrochlorothiazide  (HYDRODIURIL ) tablet 50 mg  50 mg Oral Daily Pratt, Tanya S, MD   50 mg at 01/07/24 0957   HYDROcodone -acetaminophen  (NORCO/VICODIN) 5-325 MG per tablet 1-2 tablet  1-2 tablet Oral Q4H PRN Pratt, Tanya S, MD   2 tablet at 01/07/24 1115   insulin  aspart (novoLOG ) injection 0-15 Units  0-15 Units Subcutaneous TID WC Pratt, Tanya S, MD   2 Units at 01/07/24 1201   levothyroxine  (SYNTHROID ) tablet 75 mcg  75 mcg Oral Q0600 Pratt, Tanya S, MD   75 mcg at 01/07/24 0600   linagliptin  (TRADJENTA ) tablet 5 mg  5 mg Oral Daily Pratt, Tanya S, MD   5 mg at 01/07/24 0957   losartan  (COZAAR ) tablet 50 mg  50 mg Oral Daily Pratt, Tanya S, MD   50 mg at 01/07/24 0957   magnesium  oxide (MAG-OX) tablet 400 mg  400 mg Oral Daily Pratt, Tanya S, MD   400 mg at 01/07/24 5409   methocarbamol  (ROBAXIN ) tablet 500 mg  500 mg Oral BID PRN Pratt, Tanya S, MD   500 mg at 01/07/24 1116   metoprolol  tartrate (LOPRESSOR ) injection 5 mg  5 mg Intravenous Q6H PRN Pratt, Tanya S, MD       metroNIDAZOLE  (FLAGYL ) IVPB 500 mg  500 mg Intravenous Q12H  [place] Layer, MD 100 mL/hr at 01/07/24 0602 500 mg at 01/07/24 0602   ondansetron  (ZOFRAN ) tablet 4 mg  4 mg Oral Q6H PRN Pratt, Tanya S, MD   4 mg at 01/07/24 0955   Or   ondansetron  (ZOFRAN ) injection 4 mg  4 mg Intravenous Q6H PRN Pratt, Tanya S, MD   4 mg at 01/04/24 1715   pantoprazole  (PROTONIX ) EC tablet 40 mg  40 mg Oral Daily Tiffany Foerster  S, MD   40 mg at 01/07/24 0958   polycarbophil (FIBERCON) tablet 625 mg  625 mg Oral BID Candyce Champagne, MD   625 mg at 01/07/24 1610   polyethylene glycol (MIRALAX  / GLYCOLAX ) packet 17 g  17 g Oral BID Fonnie Iba I, MD   17 g at 01/06/24 2125   prochlorperazine  (COMPAZINE ) injection 10 mg  10 mg Intravenous Q6H PRN Lorita Rosa, MD   10 mg at 01/04/24 1752      Labs  Recent Labs  Lab 01/05/24 1105 01/06/24 0505 01/07/24 0431  WBC 6.0 5.1 5.7  HGB 11.9* 11.0* 10.8*  HCT 36.2 33.6* 33.9*  PLT 245 240 253    Recent Labs  Lab 01/02/24 1359 01/03/24 0434 01/05/24 1105 01/06/24 0505 01/07/24 0431  NA 136   < > 135 138 135  K 3.8   < > 3.3* 3.5 3.4*  CL 97*   < > 101 106 104  CO2 21*   < > 24 26 23   BUN 16   < > 12 8 6*  CREATININE 1.62*   < > 1.59* 1.19* 1.06*  CALCIUM 10.4*   < > 9.2 9.0 8.5*  PROT 8.5*  --   --   --   --   BILITOT 0.4  --   --   --   --   ALKPHOS 70  --   --   --   --   ALT 29  --   --   --   --   AST 19  --   --   --   --   GLUCOSE 106*   < > 158* 96 103*   < > = values in this interval not displayed.    Assessment & Plan:  POD#1 s/p D&C, hysteroscopy, pap c/b uterine perf >> Dx laparoscopy, LOA *GYN:  - Reviewed intraoperative complication and subsequent findings with Adriana Hopping, - daughter Elliot Gutting was present as well and is the daughter I spoke with yesterday regarding these findings. Discussed uterus unfortunately still has fluid in it. Spoke with Dr. Orvil Bland who would be happy to see her at the 4 week mark. She agreed with the doxycycline  for possible endometritis for 2 weeks and repeat US  at the 3-4  week mark with the appointment with Dr. Orvil Bland to follow.  - Reviewed pathology and pap are still pending as of now but I did place it as "rush" - Discussed that once able to tolerate PO, that doxycycline  works just as well PO as IV.  - I put in the outpatient order for US  and have contacted my office to arrange out pt US  for her.   *Pain: Controlled and comfortable.  *FEN/GI: Still with issues with emesis. Main barrier to discharge at this time.  *PPx: SCDs *Dispo: per primary team.   We will follow along peripherally until pathology is back.   Code Status: Full Code  Total time taking care of the patient was 30 minutes, with greater than 50% of the time spent in face to face interaction with the patient.  [name], MD Attending Center for [facility])

## 2024-01-08 DIAGNOSIS — S3769XA Other injury of uterus, initial encounter: Secondary | ICD-10-CM | POA: Diagnosis not present

## 2024-01-08 LAB — CBC WITH DIFFERENTIAL/PLATELET
Abs Immature Granulocytes: 0.02 10*3/uL (ref 0.00–0.07)
Basophils Absolute: 0 10*3/uL (ref 0.0–0.1)
Basophils Relative: 0 %
Eosinophils Absolute: 0.1 10*3/uL (ref 0.0–0.5)
Eosinophils Relative: 2 %
HCT: 33 % — ABNORMAL LOW (ref 36.0–46.0)
Hemoglobin: 10.9 g/dL — ABNORMAL LOW (ref 12.0–15.0)
Immature Granulocytes: 0 %
Lymphocytes Relative: 32 %
Lymphs Abs: 1.6 10*3/uL (ref 0.7–4.0)
MCH: 30.5 pg (ref 26.0–34.0)
MCHC: 33 g/dL (ref 30.0–36.0)
MCV: 92.4 fL (ref 80.0–100.0)
Monocytes Absolute: 0.6 10*3/uL (ref 0.1–1.0)
Monocytes Relative: 11 %
Neutro Abs: 2.8 10*3/uL (ref 1.7–7.7)
Neutrophils Relative %: 55 %
Platelets: 271 10*3/uL (ref 150–400)
RBC: 3.57 MIL/uL — ABNORMAL LOW (ref 3.87–5.11)
RDW: 14.6 % (ref 11.5–15.5)
WBC: 5.2 10*3/uL (ref 4.0–10.5)
nRBC: 0 % (ref 0.0–0.2)

## 2024-01-08 LAB — BASIC METABOLIC PANEL WITH GFR
Anion gap: 8 (ref 5–15)
BUN: 6 mg/dL — ABNORMAL LOW (ref 8–23)
CO2: 25 mmol/L (ref 22–32)
Calcium: 9 mg/dL (ref 8.9–10.3)
Chloride: 104 mmol/L (ref 98–111)
Creatinine, Ser: 1.14 mg/dL — ABNORMAL HIGH (ref 0.44–1.00)
GFR, Estimated: 50 mL/min — ABNORMAL LOW (ref >60)
Glucose, Bld: 88 mg/dL (ref 70–99)
Potassium: 3.7 mmol/L (ref 3.5–5.1)
Sodium: 137 mmol/L (ref 135–145)

## 2024-01-08 LAB — GLUCOSE, CAPILLARY
Glucose-Capillary: 80 mg/dL (ref 70–99)
Glucose-Capillary: 94 mg/dL (ref 70–99)
Glucose-Capillary: 142 mg/dL — ABNORMAL HIGH (ref 70–99)
Glucose-Capillary: 126 mg/dL — ABNORMAL HIGH (ref 70–99)

## 2024-01-08 MED ORDER — HYDROCHLOROTHIAZIDE 25 MG PO TABS
25.0000 mg | ORAL_TABLET | Freq: Every day | ORAL | Status: DC
  Administered 2024-01-09: 25 mg via ORAL
  Filled 2024-01-08: qty 1

## 2024-01-08 NOTE — Progress Notes (Signed)
CBG 126

## 2024-01-08 NOTE — Progress Notes (Signed)
 Progress Note   Patient: Alexandria Durham:[id number] DOB: 03/16/1946 DOA: 01/02/2024     2 DOS: the patient was seen and examined on 01/08/2024   Brief hospital course: 78 with h/o stage 3a CKD, DM, HTN, HLD, and endometrial abnormality s/p recent biopsy who presented on 5/4 with abdominal pain and n/v.  Imaging with mild diverticulitis, treated with Ceftriaxone  and metronidazole .  Persistent significant abdominal TTP so surgery and then GYN were called.  Added doxy for possible endometritis.  Not improving after 24 hours so underwent D&C on 5/8, complicated by uterine perforation.  Assessment and Plan:  Endometrial abnormality Worrisome for endometrial cancer Recent endometrial biopsy without evidence of hyperplasia or malignancy Discussed with GYN - recommend adding oral doxy on 5/8 for endometritis, x 14 days Underwent D&C with unfortunate complication of uterine perforation Pap, EMB, and endocervical curette were performed, pathology still pending Follow-up with OB/GYN on discharge Alexandria Maize, MD) She may need an additional ultrasound-guided D&C and hysteroscopy vs. Hysterectomy if cervical tests are negative   Acute descending colon/sigmoid diverticulitis Continue IV Rocephin  plus Flagyl  Due to lack of improvement with antibiotics, surgery and then GYN were consulted Given mild findings on imaging, abdominal symptoms are primarily thought to be related to endometrial issues She has completed 5 days of Ceftriaxone  and Flagyl , which is likely sufficient so will stop   Vitamin D  deficiency Continue vitamin D  repletion   Type 2 diabetes mellitus with diabetic polyneuropathy, without long-term current use of insulin   A1c 6.8, good control Continue Januvia Continue gabapentin , stop pregabalin (should not be taking both)   Postoperative hypothyroidism Continue levothyroxine  at current dose Appears to be managed by her PCP and was normal 8 months ago   Mixed  hyperlipidemia/PVD Has been intolerant of statins Continue Zetia    Gastroesophageal reflux disease without esophagitis Continue PPI   Essential hypertension Continue losartan , hydrochlorothiazide  (dose changed from 50 mg to 25 mg as there is no significant increased utility with the higher dose but it is associated with significantly more side effects) Lopressor  as needed   Stage 3a chronic kidney disease Appears to be stable at this time Attempt to avoid nephrotoxic medications    Mood d/o Continue bupropion          Consultants: None   Procedures: None   Antibiotics: Cefepime  x 1 Ceftriaxone  5/5-10 Metronidazole  5/4-10 Doxycycline  5/7-21     30 Day Unplanned Readmission Risk Score    Flowsheet Row ED to Hosp-Admission (Current) from 01/02/2024 in [facility] [place] [facility]  30 Day Unplanned Readmission Risk Score (%) 17.42 Filed at 01/08/2024 1200       This score is the patient's risk of an unplanned readmission within 30 days of being discharged (0 -100%). The score is based on dignosis, age, lab data, medications, orders, and past utilization.   Low:  0-14.9   Medium: 15-21.9   High: 22-29.9   Extreme: 30 and above           Subjective: Still with some abdominal aching and periodic spasmotic pain.  Ate turkey sausage and 1/2 biscuit this AM without difficulty.  Had a BM last night and another this AM.  Thought she was staying in the hospital for 2 more weeks of antibiotics so she does not feel ready for dc today.   Objective: Vitals:   01/08/24 0509 01/08/24 1304  BP: (!) 152/72 (!) 158/72  Pulse: 87 80  Resp: 16   Temp: 98 F (36.7 C)   SpO2: 100%  99%    Intake/Output Summary (Last 24 hours) at 01/08/2024 1512 Last data filed at 01/08/2024 1500 Gross per 24 hour  Intake 1450.43 ml  Output 3000 ml  Net -1549.57 ml   Filed Weights   01/02/24 1327 01/02/24 2041 01/06/24 1032  Weight: 69.4 kg 71.3 kg 71.3 kg    Exam:  General:  Appears  calm and comfortable and is in NAD Eyes:  EOMI, normal lids, iris ENT:  grossly normal hearing, lips & tongue, mmm Neck:  no LAD, masses or thyromegaly Cardiovascular:  RRR, no m/r/g. No LE edema.  Respiratory:   CTA bilaterally with no wheezes/rales/rhonchi.  Normal respiratory effort. Abdomen:  soft, improving but still significant diffuse TTP, ND Skin:  no rash or induration seen on limited exam Musculoskeletal:  grossly normal tone BUE/BLE, good ROM, no bony abnormality Psychiatric:  grossly normal mood and affect, speech fluent and appropriate, AOx3 Neurologic:  CN 2-12 grossly intact, moves all extremities in coordinated fashion  Data Reviewed: I have reviewed the patient's lab results since admission.  Pertinent labs for today include:   BUN 6/Creatinine 1.14/GFR 50 WBC 5.2 WBC 10.9     Family Communication: None present  Disposition: Status is: Inpatient Remains inpatient appropriate because: transitioning to all PO medications, anticipate dc on 5/11     Time spent: 50 minutes  Unresulted Labs (From admission, onward)    None        Author: Lorita Rosa, MD 01/08/2024 3:12 PM  For on call review [email or web address].

## 2024-01-08 NOTE — Progress Notes (Signed)
 2 Days Post-Op   Subjective/Chief Complaint: Pain is improving   Objective: Vital signs in last 24 hours: Temp:  [98 F (36.7 C)] 98 F (36.7 C) (05/10 0509) Pulse Rate:  [87-95] 87 (05/10 0509) Resp:  [16-19] 16 (05/10 0509) BP: (139-152)/(60-72) 152/72 (05/10 0509) SpO2:  [93 %-100 %] 100 % (05/10 0509) Last BM Date : 01/07/24  Intake/Output from previous day: 05/09 0701 - 05/10 0700 In: 1025.5 [P.O.:610; IV Piggyback:415.5] Out: 1600 [Urine:1600] Intake/Output this shift: Total I/O In: 640 [P.O.:640] Out: 500 [Urine:500]  General appearance: alert and cooperative Resp: clear to auscultation bilaterally Cardio: regular rate and rhythm GI: Soft with mild to moderate tenderness.  Good bowel sounds.  Lab Results:  Recent Labs    01/07/24 0431 01/08/24 0544  WBC 5.7 5.2  HGB 10.8* 10.9*  HCT 33.9* 33.0*  PLT 253 271   BMET Recent Labs    01/07/24 0431 01/08/24 0544  NA 135 137  K 3.4* 3.7  CL 104 104  CO2 23 25  GLUCOSE 103* 88  BUN 6* 6*  CREATININE 1.06* 1.14*  CALCIUM 8.5* 9.0   PT/INR No results for input(s): "LABPROT", "INR" in the last 72 hours. ABG No results for input(s): "PHART", "HCO3" in the last 72 hours.  Invalid input(s): "PCO2", "PO2"  Studies/Results: No results found.  Anti-infectives: Anti-infectives (From admission, onward)    Start     Dose/Rate Route Frequency Ordered Stop   01/06/24 1315  ceFAZolin  (ANCEF ) IVPB 2g/100 mL premix        2 g 200 mL/hr over 30 Minutes Intravenous  Once 01/06/24 1312 01/06/24 1309   01/05/24 2200  doxycycline  (VIBRA -TABS) tablet 100 mg        100 mg Oral Every 12 hours 01/05/24 1511     01/03/24 0600  metroNIDAZOLE  (FLAGYL ) IVPB 500 mg        500 mg 100 mL/hr over 60 Minutes Intravenous Every 12 hours 01/02/24 2157     01/03/24 0200  cefTRIAXone  (ROCEPHIN ) 2 g in sodium chloride  0.9 % 100 mL IVPB        2 g 200 mL/hr over 30 Minutes Intravenous Every 24 hours 01/02/24 2157     01/02/24  1730  ceFEPIme  (MAXIPIME ) 2 g in sodium chloride  0.9 % 100 mL IVPB       Placed in "And" Linked Group   2 g 200 mL/hr over 30 Minutes Intravenous  Once 01/02/24 1727 01/02/24 1857   01/02/24 1730  metroNIDAZOLE  (FLAGYL ) IVPB 500 mg       Placed in "And" Linked Group   500 mg 100 mL/hr over 60 Minutes Intravenous  Once 01/02/24 1727 01/02/24 1948       Assessment/Plan: s/p Procedure(s) with comments: DILATATION AND CURETTAGE /HYSTEROSCOPY (N/A) - PAP SMEAR (DR TO BRING SUPPLIES)  TUCKERS CARD LAPAROSCOPY, DIAGNOSTIC (N/A) LYSIS, ADHESIONS, LAPAROSCOPIC Advance diet Ambulate No obvious bowel injury at time of surgery. Okay for discharge once she passes flatus and tolerates a regular diet from surgery standpoint.  Will sign off  LOS: 2 days    Alexandria Durham 01/08/2024

## 2024-01-08 NOTE — Plan of Care (Signed)

## 2024-01-09 DIAGNOSIS — S3769XA Other injury of uterus, initial encounter: Secondary | ICD-10-CM | POA: Diagnosis not present

## 2024-01-09 LAB — GLUCOSE, CAPILLARY
Glucose-Capillary: 92 mg/dL (ref 70–99)
Glucose-Capillary: 73 mg/dL (ref 70–99)

## 2024-01-09 MED ORDER — DOXYCYCLINE HYCLATE 100 MG PO TABS: 100.0000 mg | ORAL_TABLET | Freq: Two times a day (BID) | ORAL | 0 refills | Status: DC

## 2024-01-09 MED ORDER — HYDROCHLOROTHIAZIDE 25 MG PO TABS: 25.0000 mg | ORAL_TABLET | Freq: Every day | ORAL | 0 refills | Status: AC

## 2024-01-09 MED ORDER — OXYCODONE HCL 5 MG PO TABS: 5.0000 mg | ORAL_TABLET | ORAL | 0 refills | Status: DC | PRN

## 2024-01-09 MED ORDER — ONDANSETRON HCL 4 MG PO TABS: 4.0000 mg | ORAL_TABLET | Freq: Four times a day (QID) | ORAL | 0 refills | Status: DC | PRN

## 2024-01-09 MED ORDER — CALCIUM POLYCARBOPHIL 625 MG PO TABS: 625.0000 mg | ORAL_TABLET | Freq: Two times a day (BID) | ORAL | 0 refills | Status: DC

## 2024-01-09 NOTE — Evaluation (Addendum)
 Physical Therapy Evaluation Patient Details Name: Alexandria Durham MRN: [id number] DOB: [birth year] Today's Date: 01/09/2024  History of Present Illness  "50 with h/o stage 3a CKD, DM, HTN, HLD, and endometrial abnormality s/p recent biopsy who presented on 5/4 with abdominal pain and n/v.  Imaging with mild diverticulitis, treated with Ceftriaxone  and metronidazole .  Persistent significant abdominal TTP so surgery and then GYN were called.  Added doxy for possible endometritis.  Not improving after 24 hours so underwent D&C on 5/8, complicated by uterine perforation."  Clinical Impression  Pt admitted with above diagnosis.  Pt currently with functional limitations due to the deficits listed below (see PT Problem List). Pt will benefit from acute skilled PT to increase their independence and safety with mobility to allow discharge.  Pt assisted with ambulating in hallway and practicing stairs.  Pt plans to d/c home with her daughter and will have to perform 6-8 steps to enter.  Pt provided with gait belt for safety with stairs upon d/c.  Pt would benefit from HHPT, BSC, and RW.         If plan is discharge home, recommend the following: Help with stairs or ramp for entrance;Assistance with cooking/housework   Can travel by private vehicle        Equipment Recommendations Rolling walker (2 wheels);BSC/3in1  Recommendations for Other Services       Functional Status Assessment Patient has had a recent decline in their functional status and demonstrates the ability to make significant improvements in function in a reasonable and predictable amount of time.     Precautions / Restrictions Precautions Precautions: Fall      Mobility  Bed Mobility Overal bed mobility: Needs Assistance Bed Mobility: Supine to Sit, Sit to Supine     Supine to sit: Supervision, Used rails, HOB elevated Sit to supine: Supervision, HOB elevated, Used rails   General bed mobility comments: increased time and  effort due to pain    Transfers Overall transfer level: Needs assistance Equipment used: Rolling walker (2 wheels) Transfers: Sit to/from Stand Sit to Stand: Contact guard assist           General transfer comment: cues for hand placement    Ambulation/Gait Ambulation/Gait assistance: Contact guard assist Gait Distance (Feet): 160 Feet Assistive device: Rolling walker (2 wheels) Gait Pattern/deviations: Step-through pattern, Decreased stride length, Decreased stance time - left, Antalgic, Trunk flexed Gait velocity: decr     General Gait Details: pt reports chronic left hip pain after compensating so long (s/p R THA per pt) so antalgic gait observed but pt steady with RW, reliant on UE support for endurance and pain control of abdomen (surgical areas per pt); cues for upright posture as tolerated  Stairs Stairs: Yes Stairs assistance: Contact guard assist Stair Management: Step to pattern, Forwards, One rail Left Number of Stairs: 10 General stair comments: verbal cues for sequence and safety, pt held left rail and therapist provided support for pt's R hand; pt reliant on bil support due to abdominal pain  Wheelchair Mobility     Tilt Bed    Modified Rankin (Stroke Patients Only)       Balance Overall balance assessment: Mild deficits observed, not formally tested                                           Pertinent Vitals/Pain Pain Assessment Pain Assessment: Faces  Faces Pain Scale: Hurts even more Pain Location: abdomen Pain Descriptors / Indicators: Sore, Grimacing, Guarding Pain Intervention(s): Repositioned, Monitored during session    Home Living Family/patient expects to be discharged to:: Private residence Living Arrangements: Alone Available Help at Discharge: Family   Home Access: Stairs to enter Entrance Stairs-Rails: Lawyer of Steps: 6-8   Home Layout: One level Home Equipment: Cane - single  point Additional Comments: pt plans to d/c home with her daughter (as above)    Prior Function Prior Level of Function : Independent/Modified Independent                     Extremity/Trunk Assessment   Upper Extremity Assessment Upper Extremity Assessment: LUE deficits/detail LUE Deficits / Details: pt reports chronic R shoulder strength and ROM issues as pt reports she needs a R shoulder surgery    Lower Extremity Assessment Lower Extremity Assessment: Generalized weakness    Cervical / Trunk Assessment Cervical / Trunk Assessment: Normal  Communication   Communication Communication: No apparent difficulties    Cognition Arousal: Alert Behavior During Therapy: WFL for tasks assessed/performed   PT - Cognitive impairments: No apparent impairments                         Following commands: Intact       Cueing       General Comments      Exercises     Assessment/Plan    PT Assessment Patient needs continued PT services  PT Problem List Decreased strength;Decreased activity tolerance;Decreased mobility;Decreased knowledge of use of DME       PT Treatment Interventions DME instruction;Gait training;Balance training;Stair training;Functional mobility training;Patient/family education;Therapeutic activities;Therapeutic exercise    PT Goals (Current goals can be found in the Care Plan section)  Acute Rehab PT Goals PT Goal Formulation: With patient Time For Goal Achievement: 01/23/24 Potential to Achieve Goals: Good    Frequency Min 2X/week     Co-evaluation               AM-PAC PT "6 Clicks" Mobility  Outcome Measure Help needed turning from your back to your side while in a flat bed without using bedrails?: A Little Help needed moving from lying on your back to sitting on the side of a flat bed without using bedrails?: A Little Help needed moving to and from a bed to a chair (including a wheelchair)?: A Little Help needed  standing up from a chair using your arms (e.g., wheelchair or bedside chair)?: A Little Help needed to walk in hospital room?: A Little Help needed climbing 3-5 steps with a railing? : A Little 6 Click Score: 18    End of Session Equipment Utilized During Treatment: Gait belt Activity Tolerance: Patient tolerated treatment well Patient left: in bed;with call bell/phone within reach   PT Visit Diagnosis: Difficulty in walking, not elsewhere classified (R26.2)    Time: 1610-9604 PT Time Calculation (min) (ACUTE ONLY): 20 min   Charges:   PT Evaluation $PT Eval Low Complexity: 1 Low   PT General Charges $$ ACUTE PT VISIT: 1 Visit       Blanch Bunde, DPT Physical Therapist Acute Rehabilitation Services Office: [phone]  Myna Asal Payson 01/09/2024, 4:10 PM

## 2024-01-09 NOTE — TOC Transition Note (Deleted)
 Transition of Care [facility]) - Discharge Note   Patient Details  Name: Alexandria Durham MRN: [id number] Date of Birth: 29-Sep-1945  Transition of Care [facility] CM/SW Contact:  Levie Ream, RN Phone Number: 01/09/2024, 2:27 PM   Clinical Narrative:        Barriers to Discharge: No Barriers Identified   Patient Goals and CMS Choice            Discharge Placement                       Discharge Plan and Services Additional resources added to the After Visit Summary for                  DME Arranged: 3-N-1, Walker rolling DME Agency: [employer] Date DME Agency Contacted: 01/09/24 Time DME Agency Contacted: 1415 Representative spoke with at DME Agency: Zula Hitch            Social Drivers of Health (SDOH) Interventions SDOH Screenings   Food Insecurity: No Food Insecurity (01/03/2024)  Housing: Low Risk  (01/03/2024)  Transportation Needs: No Transportation Needs (01/03/2024)  Utilities: Not At Risk (01/03/2024)  Social Connections: Unknown (01/04/2024)  Tobacco Use: High Risk (01/06/2024)     Readmission Risk Interventions     No data to display

## 2024-01-09 NOTE — Progress Notes (Addendum)
 Qualifying diagnosis for bedside commode: ambulatory dysfunction associated with endometritis/diverticulitis Patient confined to one room due to above PT recc HHPT; orders placed.

## 2024-01-09 NOTE — Discharge Summary (Addendum)
 Physician Discharge Summary   Patient: Alexandria Durham MRN: [id number] DOB: Mar 21, 1946  Admit date:     01/02/2024  Discharge date: 01/09/24  Discharge Physician: Lorita Rosa   PCP: Bayard Limbo, NP   Recommendations at discharge:   Complete antibiotics (doxycycline  twice daily for 2 total weeks) You are being prescribed a limited number of narcotic pain pills; take as directed and do not drive or make important decisions while taking this medication  Follow up with Dr. Orvil Bland from [facility]; you should be contacted for an appointment Increase hydrochlorothiazide  to 25 mg daily (new rx provided) Take fibercon twice daily Follow up with NP Scott in 1-2 weeks  Discharge Diagnoses: Principal Problem:   Uterine perforation Active Problems:   Stage 3a chronic kidney disease (HCC)   Essential hypertension   Gastroesophageal reflux disease without esophagitis   Mixed hyperlipidemia   Postoperative hypothyroidism   PVD (peripheral vascular disease) (HCC)   Type 2 diabetes mellitus (HCC)   Type 2 diabetes mellitus with diabetic polyneuropathy, without long-term current use of insulin  (HCC)   Vitamin D  deficiency   Abdominal pain   Thickened endometrium   Hospital Course: 47 with h/o stage 3a CKD, DM, HTN, HLD, and endometrial abnormality s/p recent biopsy who presented on 5/4 with abdominal pain and n/v.  Imaging with mild diverticulitis, treated with Ceftriaxone  and metronidazole .  Persistent significant abdominal TTP so surgery and then GYN were called.  Added doxy for possible endometritis.  Not improving after 24 hours so underwent D&C on 5/8, complicated by uterine perforation.  Assessment and Plan:  Endometrial abnormality Worrisome for endometrial cancer Recent endometrial biopsy without evidence of hyperplasia or malignancy Discussed with GYN - recommend adding oral doxy on 5/8 for endometritis, x 14 days Underwent D&C with unfortunate complication of uterine  perforation Pap, EMB, and endocervical curette were performed, pathology still pending Follow-up with OB/GYN on discharge [name], MD) She may need an additional ultrasound-guided D&C and hysteroscopy vs. Hysterectomy if cervical tests are negative   Acute descending colon/sigmoid diverticulitis Continue IV Rocephin  plus Flagyl  Due to lack of improvement with antibiotics, surgery and then GYN were consulted Given mild findings on imaging, abdominal symptoms are primarily thought to be related to endometrial issues She has completed 5 days of Ceftriaxone  and Flagyl    Vitamin D  deficiency Continue vitamin D  repletion   Type 2 diabetes mellitus with diabetic polyneuropathy, without long-term current use of insulin   A1c 6.8, good control Continue Januvia Continue pregabalin (should not also be taking gabapentin )   Postoperative hypothyroidism Continue levothyroxine  at current dose Appears to be managed by her PCP and was normal 8 months ago   Mixed hyperlipidemia/PVD Has been intolerant of statins Continue Zetia    Gastroesophageal reflux disease without esophagitis Continue PPI   Essential hypertension Continue losartan , hydrochlorothiazide  (dose changed from 12.5 to 25 mg daily) Lopressor  as needed   Stage 3a chronic kidney disease Appears to be stable at this time Attempt to avoid nephrotoxic medications    Mood d/o Continue bupropion   Ambulatory dysfunction PT consulted Recommended to have RW and BSC as well as home health        Consultants: None   Procedures: None   Antibiotics: Cefepime  x 1 Ceftriaxone  5/5-10 Metronidazole  5/4-10 Doxycycline  5/7-21        Pain control - [place]  Controlled Substance Reporting System database was reviewed. and patient was instructed, not to drive, operate heavy machinery, perform activities at heights, swimming or participation in water activities or  provide baby-sitting services while on Pain, Sleep and  Anxiety Medications; until their outpatient Physician has advised to do so again. Also recommended to not to take more than prescribed Pain, Sleep and Anxiety Medications.   Disposition: Home Diet recommendation:  Carb modified diet DISCHARGE MEDICATION: Allergies as of 01/09/2024       Reactions   Ace Inhibitors Hives, Itching   Aspirin Palpitations   Codeine Palpitations   Penicillins Rash        Medication List     TAKE these medications    acetaminophen  500 MG tablet Commonly known as: TYLENOL  Take 1,000 mg by mouth every 6 (six) hours as needed for moderate pain.   ammonium lactate 12 % cream Commonly known as: AMLACTIN Apply 1 Application topically as needed for dry skin.   bisacodyl 5 MG EC tablet Generic drug: bisacodyl Take 5 mg by mouth daily as needed for moderate constipation.   buPROPion  150 MG 24 hr tablet Commonly known as: WELLBUTRIN  XL Take 150 mg by mouth every evening.   diphenhydramine -acetaminophen  25-500 MG Tabs tablet Commonly known as: TYLENOL  PM Take 2 tablets by mouth at bedtime as needed (Sleep/Pain).   doxycycline  100 MG tablet Commonly known as: VIBRA -TABS Take 1 tablet (100 mg total) by mouth every 12 (twelve) hours for 10 days.   ezetimibe  10 MG tablet Commonly known as: ZETIA  Take 1 tablet by mouth daily.   fluticasone  50 MCG/ACT nasal spray Commonly known as: FLONASE  Place 2 sprays into both nostrils daily. What changed:  when to take this reasons to take this   gabapentin  300 MG capsule Commonly known as: NEURONTIN  Take 600 mg by mouth 2 (two) times daily.   hydrochlorothiazide  25 MG tablet Commonly known as: HYDRODIURIL  Take 1 tablet (25 mg total) by mouth daily. Start taking on: Jan 10, 2024 What changed:  medication strength how much to take   HYDROcodone -homatropine 5-1.5 MG/5ML syrup Commonly known as: HYCODAN Take 5 mLs by mouth every 6 (six) hours as needed for cough.   IRON PO Take 1 tablet by mouth  daily.   Januvia 50 MG tablet Generic drug: sitaGLIPtin Take 50 mg by mouth daily.   levothyroxine  75 MCG tablet Commonly known as: SYNTHROID  Take 1 tablet by mouth daily.   losartan  50 MG tablet Commonly known as: COZAAR  Take 50 mg by mouth daily.   magnesium  oxide 400 MG tablet Commonly known as: MAG-OX Take 400 mg by mouth daily.   meclizine 25 MG tablet Commonly known as: ANTIVERT Take 25 mg by mouth daily.   methocarbamol  500 MG tablet Commonly known as: ROBAXIN  Take 1 tablet (500 mg total) by mouth 2 (two) times daily as needed for muscle spasms.   mometasone 0.1 % cream Commonly known as: ELOCON Apply 1 Application topically daily as needed (Dermatitis).   omeprazole 40 MG capsule Commonly known as: PRILOSEC Take 40 mg by mouth daily.   ondansetron  4 MG tablet Commonly known as: ZOFRAN  Take 1 tablet (4 mg total) by mouth every 6 (six) hours as needed for nausea.   oxyCODONE  5 MG immediate release tablet Commonly known as: Oxy IR/ROXICODONE  Take 1 tablet (5 mg total) by mouth every 4 (four) hours as needed for severe pain (pain score 7-10).   polycarbophil 625 MG tablet Commonly known as: FIBERCON Take 1 tablet (625 mg total) by mouth 2 (two) times daily.   polyethylene glycol powder 17 GM/SCOOP powder Commonly known as: GLYCOLAX /MIRALAX  Take 17 g by mouth 2 (two) times daily  as needed for moderate constipation.   traMADol  50 MG tablet Commonly known as: ULTRAM  Take 50 mg by mouth 2 (two) times daily as needed for moderate pain (pain score 4-6).   Vitamin D3 25 MCG (1000 UT) Caps Take 1,000 Units by mouth daily.               Durable Medical Equipment  (From admission, onward)           Start     Ordered   01/09/24 1400  DME Walker  Once       Question Answer Comment  Walker: With 5 Inch Wheels   Patient needs a walker to treat with the following condition Ambulatory dysfunction      01/09/24 1400   01/09/24 1400  DME 3-in-1  Once         01/09/24 1400            Follow-up Information     [facility] Follow up.   Specialty: Home Health Services Contact information: [address] [place] [address] [phone]                Discharge Exam:   Subjective: Feeling ok.  Ambulating with a walker, which she did not use at home PTA.  PT consulted.   Objective: Vitals:   01/09/24 0533 01/09/24 1304  BP: 131/65 (!) 146/62  Pulse: 82 79  Resp: 16 18  Temp: 98 F (36.7 C) 97.9 F (36.6 C)  SpO2: 100% 99%    Intake/Output Summary (Last 24 hours) at 01/09/2024 1541 Last data filed at 01/09/2024 1255 Gross per 24 hour  Intake 900 ml  Output 1150 ml  Net -250 ml   Filed Weights   01/02/24 1327 01/02/24 2041 01/06/24 1032  Weight: 69.4 kg 71.3 kg 71.3 kg    Exam:  General:  Appears calm and comfortable and is in NAD Eyes:  EOMI, normal lids, iris ENT:  grossly normal hearing, lips & tongue, mmm Neck:  no LAD, masses or thyromegaly Cardiovascular:  RRR, no m/r/g. No LE edema.  Respiratory:   CTA bilaterally with no wheezes/rales/rhonchi.  Normal respiratory effort. Abdomen:  soft, improving but still significant diffuse TTP, ND Skin:  no rash or induration seen on limited exam Musculoskeletal:  grossly normal tone BUE/BLE, good ROM, no bony abnormality Psychiatric:  grossly normal mood and affect, speech fluent and appropriate, AOx3 Neurologic:  CN 2-12 grossly intact, moves all extremities in coordinated fashion   Data Reviewed: I have reviewed the patient's lab results since admission.  Pertinent labs for today include:   None today    Condition at discharge: improving  The results of significant diagnostics from this hospitalization (including imaging, microbiology, ancillary and laboratory) are listed below for reference.   Imaging Studies: CT ABDOMEN PELVIS W CONTRAST Result Date: 01/02/2024 CLINICAL DATA:  Diffuse abdominal pain and nausea for  several days. EXAM: CT ABDOMEN AND PELVIS WITH CONTRAST TECHNIQUE: Multidetector CT imaging of the abdomen and pelvis was performed using the standard protocol following bolus administration of intravenous contrast. RADIATION DOSE REDUCTION: This exam was performed according to the departmental dose-optimization program which includes automated exposure control, adjustment of the mA and/or kV according to patient size and/or use of iterative reconstruction technique. CONTRAST:  60mL OMNIPAQUE  IOHEXOL  300 MG/ML  SOLN COMPARISON:  12/07/2023 FINDINGS: Lower Chest: No acute findings. Hepatobiliary: No suspicious hepatic masses identified. Gallbladder is unremarkable. No evidence of biliary ductal dilatation. Pancreas:  No mass or inflammatory changes. Spleen: Within normal limits in size and appearance. Adrenals/Urinary Tract: No suspicious masses identified. No evidence of ureteral calculi or hydronephrosis. Bladder is poorly visualized due to severe beam hardening artifact from left hip prosthesis. Stomach/Bowel: Severe diffuse colonic diverticulosis is again seen. Mild diverticulitis is seen near the junction of the descending and sigmoid colon. No evidence of perforation or abscess. Vascular/Lymphatic: No pathologically enlarged lymph nodes. No acute vascular findings. Retroaortic left renal vein again noted. Reproductive: Diffuse distension of the endometrial cavity is again seen which may be due to diffuse endometrial thickening or hydrometros. Adnexal regions are unremarkable. Other:  None. Musculoskeletal: No suspicious bone lesions identified. 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 IMPRESSION: Mild diverticulitis near the junction of the descending and sigmoid colon. No evidence of perforation or abscess. Distension of endometrial cavity again seen, which may be due to diffuse endometrial thickening or hydrometros. Endometrial carcinoma cannot be excluded in  a postmenopausal female. GYN consultation is recommended. Electronically Signed   By: Marlyce Sine M.D.   On: 01/02/2024 16:18    Microbiology: Results for orders placed or performed during the hospital encounter of 03/06/16  Urine culture     Status: Abnormal   Collection Time: 03/06/16 10:15 AM   Specimen: Urine, Clean Catch  Result Value Ref Range Status   Specimen Description URINE, CLEAN CATCH  Final   Special Requests NONE  Final   Culture 10,000 COLONIES/mL YEAST (A)  Final   Report Status 03/07/2016 FINAL  Final  Wet prep, genital     Status: Abnormal   Collection Time: 03/06/16 12:35 PM   Specimen: Cervical/Vaginal swab; Genital  Result Value Ref Range Status   Yeast Wet Prep HPF POC PRESENT (A) NONE SEEN Final   Trich, Wet Prep PRESENT (A) NONE SEEN Final   Clue Cells Wet Prep HPF POC NONE SEEN NONE SEEN Final   WBC, Wet Prep HPF POC MANY (A) NONE SEEN Final   Sperm NONE SEEN  Final    Labs: CBC: Recent Labs  Lab 01/04/24 1150 01/05/24 1105 01/06/24 0505 01/07/24 0431 01/08/24 0544  WBC 10.0 6.0 5.1 5.7 5.2  NEUTROABS 6.9 4.1 2.7 3.6 2.8  HGB 13.0 11.9* 11.0* 10.8* 10.9*  HCT 39.4 36.2 33.6* 33.9* 33.0*  MCV 92.5 92.3 93.1 93.6 92.4  PLT 260 245 240 253 271   Basic Metabolic Panel: Recent Labs  Lab 01/04/24 1150 01/05/24 1105 01/06/24 0505 01/07/24 0431 01/08/24 0544  NA 133* 135 138 135 137  K 3.6 3.3* 3.5 3.4* 3.7  CL 100 101 106 104 104  CO2 20* 24 26 23 25   GLUCOSE 86 158* 96 103* 88  BUN 12 12 8  6* 6*  CREATININE 1.45* 1.59* 1.19* 1.06* 1.14*  CALCIUM 9.3 9.2 9.0 8.5* 9.0  MG 1.7  --   --   --   --   PHOS 2.5  --   --   --   --    Liver Function Tests: Recent Labs  Lab 01/04/24 1150  ALBUMIN 3.5   CBG: Recent Labs  Lab 01/08/24 1118 01/08/24 1544 01/08/24 2102 01/09/24 0730 01/09/24 1126  GLUCAP 94 142* 126* 92 73    Discharge time spent: greater than 30 minutes.  Signed: Lorita Rosa, MD [facility] 01/09/2024

## 2024-01-09 NOTE — TOC Initial Note (Addendum)
 Transition of Care [facility]) - Initial/Assessment Note    Patient Details  Name: Alexandria Durham MRN: [id number] Date of Birth: [birth year]  [facility] CM/SW Contact:    Levie Ream, RN Phone Number: 01/09/2024, 2:49 PM  Clinical Narrative:                 Orders received for HHPT, 3-n-1, and RW; spoke w/ pt in room; pt agrees to recc DME, and HH services; she does not have an agency preference for either; pt says she had HH services in the past but she does not remember name of providing agency; pt says she has a cane at home; pt says she will d/c to her dtr's home [facility] [phone]) [address] [place], [place] [address]; called Randel Buss at [facility]; he says agency can provide service; agency contact info placed in follow up provider section of d/c instructions; spoke w/ Ada at [employer]; she says DME will be delivered to pt's room; pt notified and verbalized understanding; no TOC needs. Expected Discharge Plan: Home w Home Health Services Barriers to Discharge: No Barriers Identified   Patient Goals and CMS Choice Patient states their goals for this hospitalization and ongoing recovery are:: home CMS Medicare.gov Compare Post Acute Care list provided to:: Patient   [place] ownership interest in [facility].provided to:: Patient    Expected Discharge Plan and Services   Discharge Planning Services: CM Consult   Living arrangements for the past 2 months: Single Family Home Expected Discharge Date: 01/09/24               DME Arranged: 3-N-1, Otho Blitz rolling DME Agency: [facility] Date DME Agency Contacted: 01/09/24   Representative spoke with at DME Agency: LVM at weekend contact; awaiting return call HH Arranged: PT HH Agency: [facility] Date [facility] Agency Contacted: 01/09/24 Time HH Agency Contacted: [phone] Representative spoke with at [facility] Agency: Randel Buss  Prior Living Arrangements/Services Living arrangements for the past 2 months: Single Family  Home Lives with:: Self Patient language and need for interpreter reviewed:: Yes Do you feel safe going back to the place where you live?: Yes      Need for Family Participation in Patient Care: Yes (Comment) Care giver support system in place?: Yes (comment) Current home services: DME (cane) Criminal Activity/Legal Involvement Pertinent to Current Situation/Hospitalization: No - Comment as needed  Activities of Daily Living   ADL Screening (condition at time of admission) Independently performs ADLs?: Yes (appropriate for developmental age) Is the patient deaf or have difficulty hearing?: No Does the patient have difficulty seeing, even when wearing glasses/contacts?: No Does the patient have difficulty concentrating, remembering, or making decisions?: No  Permission Sought/Granted Permission sought to share information with : Case Manager Permission granted to share information with : Yes, Verbal Permission Granted  Share Information with NAME: Case Manager     Permission granted to share info w Relationship: Terence Fend (dtr) [phone]     Emotional Assessment Appearance:: Appears stated age Attitude/Demeanor/Rapport: Gracious Affect (typically observed): Accepting Orientation: : Oriented to Self, Oriented to Place, Oriented to  Time, Oriented to Situation Alcohol / Substance Use: Not Applicable Psych Involvement: No (comment)  Admission diagnosis:  Diverticulitis [K57.92] Abdominal pain [R10.9] Patient Active Problem List   Diagnosis Date Noted   Abdominal pain 01/06/2024   Thickened endometrium 01/06/2024   Uterine perforation 01/06/2024   Diverticulitis 01/02/2024   Acute idiopathic gout of left foot 12/23/2023   Avascular necrosis of  bone of hip, left (HCC) 12/23/2023   Carpal tunnel syndrome of left wrist 09/20/2019   Bilateral carotid artery stenosis 05/10/2019   PVD (peripheral vascular disease) (HCC) 05/10/2019   Lumbar radiculopathy 07/06/2018   Lumbar  spondylosis 07/06/2018   Stage 3a chronic kidney disease (HCC) 06/06/2018   Mixed hyperlipidemia 02/24/2018   Cervical radiculopathy 09/29/2017   Former smoker 05/06/2017   Postoperative hypothyroidism 04/14/2017   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   Cortical age-related cataract of both eyes 11/25/2016   Asymmetric septal hypertrophy 04/30/2016   Gastroesophageal reflux disease without esophagitis 03/02/2016   Cardiac murmur, unspecified 02/24/2016   Benign hypertension with CKD (chronic kidney disease) stage III (HCC) 01/01/2016   Vitamin D  deficiency 12/31/2015   Essential hypertension 11/21/2015   Eczema 08/28/2015   Type 2 diabetes mellitus (HCC) 08/28/2015   Type 2 diabetes mellitus with diabetic polyneuropathy, without long-term current use of insulin  (HCC) 08/28/2015   PCP:  Bayard Limbo, NP Pharmacy:   [facility] [phone] - [place], [place] - [address] [place] [address] Phone: [phone] Fax: [phone]     Social Drivers of Health (SDOH) Social History: SDOH Screenings   Food Insecurity: No Food Insecurity (01/03/2024)  Housing: Low Risk  (01/03/2024)  Transportation Needs: No Transportation Needs (01/03/2024)  Utilities: Not At Risk (01/03/2024)  Social Connections: Unknown (01/04/2024)  Tobacco Use: High Risk (01/06/2024)   SDOH Interventions:     Readmission Risk Interventions     No data to display

## 2024-01-10 ENCOUNTER — Encounter: Payer: Self-pay | Admitting: Obstetrics and Gynecology

## 2024-01-10 ENCOUNTER — Telehealth: Payer: Self-pay

## 2024-01-10 ENCOUNTER — Other Ambulatory Visit (HOSPITAL_COMMUNITY): Payer: Self-pay

## 2024-01-10 LAB — SURGICAL PATHOLOGY

## 2024-01-10 NOTE — Telephone Encounter (Signed)
 Called patient regarding Dr. Harrel Lim request to get US  scheduled in 3 weeks. Notified patient of US  appointment on 01/31/24 at 11:00 AM at [facility]. Advised patient to arrive at 10:45 AM and to have a full bladder. Patient voiced understanding. Also provided patient with US  number if she needs to rescheduled at [phone].  Moira Andrews, RN

## 2024-01-11 ENCOUNTER — Telehealth: Payer: Self-pay

## 2024-01-11 ENCOUNTER — Encounter (HOSPITAL_COMMUNITY): Payer: Self-pay | Admitting: Emergency Medicine

## 2024-01-11 ENCOUNTER — Inpatient Hospital Stay (HOSPITAL_COMMUNITY)
Admission: EM | Admit: 2024-01-11 | Discharge: 2024-01-21 | DRG: 417 | Disposition: A | Discharge status: Home Health | Attending: Family Medicine | Admitting: Family Medicine

## 2024-01-11 ENCOUNTER — Other Ambulatory Visit: Payer: Self-pay

## 2024-01-11 ENCOUNTER — Emergency Department (HOSPITAL_COMMUNITY)

## 2024-01-11 ENCOUNTER — Inpatient Hospital Stay (HOSPITAL_COMMUNITY)

## 2024-01-11 DIAGNOSIS — N854 Malposition of uterus: Secondary | ICD-10-CM | POA: Diagnosis present

## 2024-01-11 DIAGNOSIS — Z886 Allergy status to analgesic agent status: Secondary | ICD-10-CM

## 2024-01-11 DIAGNOSIS — K8012 Calculus of gallbladder with acute and chronic cholecystitis without obstruction: Secondary | ICD-10-CM | POA: Diagnosis present

## 2024-01-11 DIAGNOSIS — Z794 Long term (current) use of insulin: Secondary | ICD-10-CM

## 2024-01-11 DIAGNOSIS — R1010 Upper abdominal pain, unspecified: Secondary | ICD-10-CM

## 2024-01-11 DIAGNOSIS — Z885 Allergy status to narcotic agent status: Secondary | ICD-10-CM

## 2024-01-11 DIAGNOSIS — K5732 Diverticulitis of large intestine without perforation or abscess without bleeding: Secondary | ICD-10-CM | POA: Diagnosis present

## 2024-01-11 DIAGNOSIS — E1122 Type 2 diabetes mellitus with diabetic chronic kidney disease: Secondary | ICD-10-CM | POA: Diagnosis present

## 2024-01-11 DIAGNOSIS — J479 Bronchiectasis, uncomplicated: Secondary | ICD-10-CM | POA: Diagnosis present

## 2024-01-11 DIAGNOSIS — F17210 Nicotine dependence, cigarettes, uncomplicated: Secondary | ICD-10-CM | POA: Diagnosis present

## 2024-01-11 DIAGNOSIS — Z96641 Presence of right artificial hip joint: Secondary | ICD-10-CM | POA: Diagnosis present

## 2024-01-11 DIAGNOSIS — K859 Acute pancreatitis without necrosis or infection, unspecified: Principal | ICD-10-CM | POA: Diagnosis present

## 2024-01-11 DIAGNOSIS — N719 Inflammatory disease of uterus, unspecified: Secondary | ICD-10-CM | POA: Diagnosis not present

## 2024-01-11 DIAGNOSIS — J42 Unspecified chronic bronchitis: Secondary | ICD-10-CM | POA: Diagnosis present

## 2024-01-11 DIAGNOSIS — N1831 Chronic kidney disease, stage 3a: Secondary | ICD-10-CM | POA: Diagnosis present

## 2024-01-11 DIAGNOSIS — E1142 Type 2 diabetes mellitus with diabetic polyneuropathy: Secondary | ICD-10-CM | POA: Diagnosis present

## 2024-01-11 DIAGNOSIS — I700 Atherosclerosis of aorta: Secondary | ICD-10-CM | POA: Diagnosis present

## 2024-01-11 DIAGNOSIS — I129 Hypertensive chronic kidney disease with stage 1 through stage 4 chronic kidney disease, or unspecified chronic kidney disease: Secondary | ICD-10-CM | POA: Diagnosis present

## 2024-01-11 DIAGNOSIS — K851 Biliary acute pancreatitis without necrosis or infection: Principal | ICD-10-CM | POA: Diagnosis present

## 2024-01-11 DIAGNOSIS — M1990 Unspecified osteoarthritis, unspecified site: Secondary | ICD-10-CM | POA: Diagnosis present

## 2024-01-11 DIAGNOSIS — Z884 Allergy status to anesthetic agent status: Secondary | ICD-10-CM

## 2024-01-11 DIAGNOSIS — Z7984 Long term (current) use of oral hypoglycemic drugs: Secondary | ICD-10-CM | POA: Diagnosis not present

## 2024-01-11 DIAGNOSIS — R9389 Abnormal findings on diagnostic imaging of other specified body structures: Secondary | ICD-10-CM | POA: Diagnosis present

## 2024-01-11 DIAGNOSIS — R1084 Generalized abdominal pain: Secondary | ICD-10-CM | POA: Diagnosis present

## 2024-01-11 DIAGNOSIS — K219 Gastro-esophageal reflux disease without esophagitis: Secondary | ICD-10-CM | POA: Diagnosis present

## 2024-01-11 DIAGNOSIS — Z888 Allergy status to other drugs, medicaments and biological substances status: Secondary | ICD-10-CM

## 2024-01-11 DIAGNOSIS — E876 Hypokalemia: Secondary | ICD-10-CM | POA: Diagnosis not present

## 2024-01-11 DIAGNOSIS — Z79890 Hormone replacement therapy (postmenopausal): Secondary | ICD-10-CM | POA: Diagnosis not present

## 2024-01-11 DIAGNOSIS — Z880 Allergy status to penicillin: Secondary | ICD-10-CM

## 2024-01-11 DIAGNOSIS — K812 Acute cholecystitis with chronic cholecystitis: Secondary | ICD-10-CM | POA: Diagnosis not present

## 2024-01-11 DIAGNOSIS — E890 Postprocedural hypothyroidism: Secondary | ICD-10-CM | POA: Diagnosis present

## 2024-01-11 DIAGNOSIS — E7800 Pure hypercholesterolemia, unspecified: Secondary | ICD-10-CM | POA: Diagnosis present

## 2024-01-11 DIAGNOSIS — R5382 Chronic fatigue, unspecified: Secondary | ICD-10-CM | POA: Diagnosis present

## 2024-01-11 DIAGNOSIS — Z79899 Other long term (current) drug therapy: Secondary | ICD-10-CM

## 2024-01-11 DIAGNOSIS — K8511 Biliary acute pancreatitis with uninfected necrosis: Secondary | ICD-10-CM | POA: Diagnosis not present

## 2024-01-11 LAB — CBC
HCT: 40.3 % (ref 36.0–46.0)
Hemoglobin: 13.2 g/dL (ref 12.0–15.0)
MCH: 30.3 pg (ref 26.0–34.0)
MCHC: 32.8 g/dL (ref 30.0–36.0)
MCV: 92.4 fL (ref 80.0–100.0)
Platelets: 453 10*3/uL — ABNORMAL HIGH (ref 150–400)
RBC: 4.36 MIL/uL (ref 3.87–5.11)
RDW: 14.7 % (ref 11.5–15.5)
WBC: 12.1 10*3/uL — ABNORMAL HIGH (ref 4.0–10.5)
nRBC: 0 % (ref 0.0–0.2)

## 2024-01-11 LAB — URINALYSIS, ROUTINE W REFLEX MICROSCOPIC
Bacteria, UA: NONE SEEN
Bilirubin Urine: NEGATIVE
Glucose, UA: 50 mg/dL — AB
Ketones, ur: 5 mg/dL — AB
Leukocytes,Ua: NEGATIVE
Nitrite: NEGATIVE
Protein, ur: NEGATIVE mg/dL
Specific Gravity, Urine: 1.01 (ref 1.005–1.030)
pH: 5 (ref 5.0–8.0)

## 2024-01-11 LAB — COMPREHENSIVE METABOLIC PANEL WITH GFR
ALT: 107 U/L — ABNORMAL HIGH (ref 0–44)
AST: 297 U/L — ABNORMAL HIGH (ref 15–41)
Albumin: 3.6 g/dL (ref 3.5–5.0)
Alkaline Phosphatase: 245 U/L — ABNORMAL HIGH (ref 38–126)
Anion gap: 12 (ref 5–15)
BUN: 13 mg/dL (ref 8–23)
CO2: 24 mmol/L (ref 22–32)
Calcium: 9.9 mg/dL (ref 8.9–10.3)
Chloride: 99 mmol/L (ref 98–111)
Creatinine, Ser: 1.2 mg/dL — ABNORMAL HIGH (ref 0.44–1.00)
GFR, Estimated: 47 mL/min — ABNORMAL LOW (ref >60)
Glucose, Bld: 225 mg/dL — ABNORMAL HIGH (ref 70–99)
Potassium: 3.5 mmol/L (ref 3.5–5.1)
Sodium: 135 mmol/L (ref 135–145)
Total Bilirubin: 1.2 mg/dL (ref 0.0–1.2)
Total Protein: 8 g/dL (ref 6.5–8.1)

## 2024-01-11 LAB — CYTOLOGY - PAP: Adequacy: ABNORMAL

## 2024-01-11 LAB — LIPASE, BLOOD: Lipase: 6697 U/L — ABNORMAL HIGH (ref 11–51)

## 2024-01-11 LAB — GLUCOSE, CAPILLARY
Glucose-Capillary: 114 mg/dL — ABNORMAL HIGH (ref 70–99)
Glucose-Capillary: 132 mg/dL — ABNORMAL HIGH (ref 70–99)
Glucose-Capillary: 137 mg/dL — ABNORMAL HIGH (ref 70–99)
Glucose-Capillary: 142 mg/dL — ABNORMAL HIGH (ref 70–99)

## 2024-01-11 LAB — TROPONIN I (HIGH SENSITIVITY): Troponin I (High Sensitivity): 7 ng/L (ref <18)

## 2024-01-11 LAB — I-STAT CG4 LACTIC ACID, ED: Lactic Acid, Venous: 1.3 mmol/L (ref 0.5–1.9)

## 2024-01-11 MED ORDER — HYDROMORPHONE HCL 1 MG/ML IJ SOLN
0.5000 mg | Freq: Once | INTRAMUSCULAR | Status: AC
  Administered 2024-01-11: 0.5 mg via INTRAVENOUS
  Filled 2024-01-11: qty 1

## 2024-01-11 MED ORDER — HYDROMORPHONE HCL 1 MG/ML IJ SOLN
1.0000 mg | Freq: Once | INTRAMUSCULAR | Status: AC
  Administered 2024-01-11: 1 mg via INTRAVENOUS
  Filled 2024-01-11: qty 1

## 2024-01-11 MED ORDER — IOHEXOL 300 MG/ML  SOLN
75.0000 mL | Freq: Once | INTRAMUSCULAR | Status: AC | PRN
  Administered 2024-01-11: 75 mL via INTRAVENOUS

## 2024-01-11 MED ORDER — SODIUM CHLORIDE 0.9 % IV SOLN: 2.0000 g | INTRAVENOUS | Status: DC

## 2024-01-11 MED ORDER — ENOXAPARIN SODIUM 40 MG/0.4ML IJ SOSY
40.0000 mg | PREFILLED_SYRINGE | INTRAMUSCULAR | Status: DC
  Administered 2024-01-11 – 2024-01-21 (×10): 40 mg via SUBCUTANEOUS
  Filled 2024-01-11 (×10): qty 0.4

## 2024-01-11 MED ORDER — INSULIN ASPART 100 UNIT/ML IJ SOLN
0.0000 [IU] | INTRAMUSCULAR | Status: DC
  Administered 2024-01-11 – 2024-01-15 (×5): 2 [IU] via SUBCUTANEOUS
  Administered 2024-01-16: 3 [IU] via SUBCUTANEOUS
  Administered 2024-01-17: 2 [IU] via SUBCUTANEOUS
  Administered 2024-01-17: 5 [IU] via SUBCUTANEOUS
  Administered 2024-01-18: 2 [IU] via SUBCUTANEOUS
  Administered 2024-01-18: 3 [IU] via SUBCUTANEOUS
  Administered 2024-01-19 – 2024-01-21 (×3): 2 [IU] via SUBCUTANEOUS
  Filled 2024-01-11: qty 0.15

## 2024-01-11 MED ORDER — ONDANSETRON HCL 4 MG/2ML IJ SOLN
4.0000 mg | Freq: Once | INTRAMUSCULAR | Status: AC | PRN
Start: 2024-01-11 — End: 2024-01-11
  Administered 2024-01-11: 4 mg via INTRAVENOUS
  Filled 2024-01-11: qty 2

## 2024-01-11 MED ORDER — SODIUM CHLORIDE 0.9 % IV BOLUS
1000.0000 mL | Freq: Once | INTRAVENOUS | Status: AC
  Administered 2024-01-11: 1000 mL via INTRAVENOUS

## 2024-01-11 MED ORDER — ONDANSETRON HCL 4 MG PO TABS: 4.0000 mg | ORAL_TABLET | Freq: Four times a day (QID) | ORAL | Status: DC | PRN

## 2024-01-11 MED ORDER — SODIUM CHLORIDE (PF) 0.9 % IJ SOLN
INTRAMUSCULAR | Status: AC
  Filled 2024-01-11: qty 50

## 2024-01-11 MED ORDER — SODIUM CHLORIDE 0.9 % IV SOLN
2.0000 g | Freq: Once | INTRAVENOUS | Status: AC
  Administered 2024-01-11: 2 g via INTRAVENOUS
  Filled 2024-01-11: qty 12.5

## 2024-01-11 MED ORDER — SODIUM CHLORIDE 0.9 % IV SOLN
INTRAVENOUS | Status: AC
Start: 2024-01-11 — End: 2024-01-12

## 2024-01-11 MED ORDER — METRONIDAZOLE 500 MG/100ML IV SOLN
500.0000 mg | Freq: Once | INTRAVENOUS | Status: AC
  Administered 2024-01-11: 500 mg via INTRAVENOUS
  Filled 2024-01-11: qty 100

## 2024-01-11 MED ORDER — HYDROMORPHONE HCL 1 MG/ML IJ SOLN
0.5000 mg | INTRAMUSCULAR | Status: DC | PRN
  Administered 2024-01-11 (×3): 1 mg via INTRAVENOUS
  Administered 2024-01-11: 0.5 mg via INTRAVENOUS
  Administered 2024-01-12 – 2024-01-14 (×10): 1 mg via INTRAVENOUS
  Administered 2024-01-14: 0.5 mg via INTRAVENOUS
  Administered 2024-01-15 – 2024-01-16 (×9): 1 mg via INTRAVENOUS
  Administered 2024-01-17: 0.5 mg via INTRAVENOUS
  Administered 2024-01-17 – 2024-01-19 (×4): 1 mg via INTRAVENOUS
  Filled 2024-01-11 (×29): qty 1

## 2024-01-11 MED ORDER — METRONIDAZOLE 500 MG/100ML IV SOLN
500.0000 mg | Freq: Two times a day (BID) | INTRAVENOUS | Status: DC
  Administered 2024-01-11 – 2024-01-18 (×13): 500 mg via INTRAVENOUS
  Filled 2024-01-11 (×14): qty 100

## 2024-01-11 MED ORDER — OXYCODONE HCL 5 MG PO TABS
10.0000 mg | ORAL_TABLET | ORAL | Status: DC | PRN
  Administered 2024-01-13 – 2024-01-16 (×8): 10 mg via ORAL
  Filled 2024-01-11 (×8): qty 2

## 2024-01-11 MED ORDER — SODIUM CHLORIDE 0.9 % IV SOLN: 2.0000 g | Freq: Once | INTRAVENOUS | Status: DC

## 2024-01-11 MED ORDER — SODIUM CHLORIDE 0.9 % IV SOLN
2.0000 g | Freq: Every day | INTRAVENOUS | Status: DC
  Administered 2024-01-11 – 2024-01-18 (×8): 2 g via INTRAVENOUS
  Filled 2024-01-11 (×8): qty 20

## 2024-01-11 MED ORDER — ALBUTEROL SULFATE (2.5 MG/3ML) 0.083% IN NEBU: 2.5000 mg | INHALATION_SOLUTION | RESPIRATORY_TRACT | Status: DC | PRN

## 2024-01-11 MED ORDER — DOXYCYCLINE HYCLATE 100 MG PO TABS
100.0000 mg | ORAL_TABLET | Freq: Two times a day (BID) | ORAL | Status: AC
  Administered 2024-01-12 – 2024-01-19 (×15): 100 mg via ORAL
  Filled 2024-01-11 (×16): qty 1

## 2024-01-11 MED ORDER — ONDANSETRON HCL 4 MG/2ML IJ SOLN
4.0000 mg | Freq: Four times a day (QID) | INTRAMUSCULAR | Status: DC | PRN
  Administered 2024-01-11 – 2024-01-12 (×3): 4 mg via INTRAVENOUS
  Filled 2024-01-11 (×3): qty 2

## 2024-01-11 MED ORDER — LEVOTHYROXINE SODIUM 75 MCG PO TABS
75.0000 ug | ORAL_TABLET | Freq: Every day | ORAL | Status: DC
  Administered 2024-01-12 – 2024-01-21 (×9): 75 ug via ORAL
  Filled 2024-01-11 (×9): qty 1

## 2024-01-11 MED ORDER — ONDANSETRON HCL 4 MG/2ML IJ SOLN
4.0000 mg | Freq: Once | INTRAMUSCULAR | Status: AC
  Administered 2024-01-11: 4 mg via INTRAVENOUS
  Filled 2024-01-11: qty 2

## 2024-01-11 MED ORDER — HYDRALAZINE HCL 20 MG/ML IJ SOLN
5.0000 mg | Freq: Four times a day (QID) | INTRAMUSCULAR | Status: DC | PRN
  Administered 2024-01-11 – 2024-01-21 (×3): 5 mg via INTRAVENOUS
  Filled 2024-01-11 (×3): qty 1

## 2024-01-11 MED ORDER — MORPHINE SULFATE (PF) 4 MG/ML IV SOLN
4.0000 mg | Freq: Once | INTRAVENOUS | Status: AC
  Administered 2024-01-11: 4 mg via INTRAVENOUS
  Filled 2024-01-11: qty 1

## 2024-01-11 MED ORDER — BUPROPION HCL ER (XL) 150 MG PO TB24
150.0000 mg | ORAL_TABLET | Freq: Every evening | ORAL | Status: DC
  Administered 2024-01-12 – 2024-01-20 (×9): 150 mg via ORAL
  Filled 2024-01-11 (×10): qty 1

## 2024-01-11 MED ORDER — LACTATED RINGERS IV SOLN: INTRAVENOUS | Status: DC

## 2024-01-11 MED ORDER — HYDROCHLOROTHIAZIDE 25 MG PO TABS
25.0000 mg | ORAL_TABLET | Freq: Every day | ORAL | Status: DC
  Administered 2024-01-12 – 2024-01-19 (×7): 25 mg via ORAL
  Filled 2024-01-11 (×7): qty 1

## 2024-01-11 NOTE — H&P (Signed)
 History and Physical  Alexandria Durham HYQ:[id number] DOB: 12/09/1945 DOA: 01/11/2024  PCP: Bayard Limbo, NP   Chief Complaint: Abdominal pain, nausea  HPI: Alexandria Durham is a 78 y.o. female with medical history significant for hypertension, CKD stage III, DM, hyperlipidemia, hypothyroidism who was recently discharged on 01/09/2024 from [facility] after a stay for suspected acute diverticulitis and significant abdominal pain requiring D&C complicated by perforation who is now being admitted to the hospital with severe abdominal pain, nausea and vomiting found to have acute cholecystitis and suspected gallstone pancreatitis.  History is provided by the patient as well as her daughter who is at the bedside.  She has been doing well at home, though she has still been having diffuse abdominal pain since discharge from the hospital which has not resolved.  Last evening, she had sudden worsening of the abdominal pain, with associated nausea and vomiting.  She had some subjective fevers, but denies any hematemesis, blood in her stools, measured fevers, chills, chest pain, shortness of breath or other concerns.  States that she has just had diffuse abdominal pain the whole time, denies any right upper quadrant pain, or pain in her back, though she does have bilateral flank pain currently.  Review of Systems: Please see HPI for pertinent positives and negatives. A complete 10 system review of systems are otherwise negative.  Past Medical History:  Diagnosis Date   Arthritis    Diabetes mellitus    Hypercholesteremia    Hypertension    Past Surgical History:  Procedure Laterality Date   BREAST CYST EXCISION     HEMORRHOID SURGERY     HIP SURGERY     HYSTEROSCOPY WITH D & C N/A 01/06/2024   Procedure: DILATATION AND CURETTAGE /HYSTEROSCOPY;  Surgeon: [name], MD;  Location: [facility];  Service: Gynecology;  Laterality: N/A;  PAP SMEAR (DR TO BRING SUPPLIES)  TUCKERS CARD   JOINT REPLACEMENT     hip  replacement   LAPAROSCOPIC LYSIS OF ADHESIONS  01/06/2024   Procedure: LYSIS, ADHESIONS, LAPAROSCOPIC;  Surgeon: [name], MD;  Location: [facility];  Service: Gynecology;;   LAPAROSCOPY N/A 01/06/2024   Procedure: LAPAROSCOPY, DIAGNOSTIC;  Surgeon: [name], MD;  Location: [facility];  Service: Gynecology;  Laterality: N/A;   TONSILLECTOMY     TUBAL LIGATION     Social History:  reports that she has been smoking cigarettes. She has never been exposed to tobacco smoke. She has never used smokeless tobacco. She reports that she does not drink alcohol and does not use drugs.  Allergies  Allergen Reactions   Ace Inhibitors Hives and Itching   Aspirin Palpitations   Codeine Palpitations   Penicillins Rash    History reviewed. No pertinent family history.   Prior to Admission medications   Medication Sig Start Date End Date Taking? Authorizing Provider  acetaminophen  (TYLENOL ) 500 MG tablet Take 1,000 mg by mouth every 6 (six) hours as needed for moderate pain.    [provider]  ammonium lactate (AMLACTIN) 12 % cream Apply 1 Application topically as needed for dry skin. 11/21/15   [provider]  bisacodyl 5 MG EC tablet Take 5 mg by mouth daily as needed for moderate constipation.    [provider]  buPROPion  (WELLBUTRIN  XL) 150 MG 24 hr tablet Take 150 mg by mouth every evening.    [provider]  Cholecalciferol  (VITAMIN D3) 25 MCG (1000 UT) CAPS Take 1,000 Units by mouth daily. Patient not taking: Reported on  01/03/2024    [provider]  diphenhydramine -acetaminophen  (TYLENOL  PM) 25-500 MG TABS tablet Take 2 tablets by mouth at bedtime as needed (Sleep/Pain).    [provider]  doxycycline  (VIBRA -TABS) 100 MG tablet Take 1 tablet (100 mg total) by mouth every 12 (twelve) hours for 10 days. 01/09/24 01/19/24  Lorita Rosa, MD  ezetimibe  (ZETIA ) 10 MG tablet Take 1 tablet by mouth daily. 10/29/23 10/28/24  [provider]   Ferrous Sulfate (IRON PO) Take 1 tablet by mouth daily.    [provider]  fluticasone  (FLONASE ) 50 MCG/ACT nasal spray Place 2 sprays into both nostrils daily. Patient taking differently: Place 2 sprays into both nostrils daily as needed for rhinitis. 10/16/16   Joy, Shawn C, PA-C  gabapentin  (NEURONTIN ) 300 MG capsule Take 600 mg by mouth 2 (two) times daily. 08/03/20   [provider]  hydrochlorothiazide  (HYDRODIURIL ) 25 MG tablet Take 1 tablet (25 mg total) by mouth daily. 01/10/24   Lorita Rosa, MD  HYDROcodone -homatropine (HYCODAN) 5-1.5 MG/5ML syrup Take 5 mLs by mouth every 6 (six) hours as needed for cough. 01/09/18   Almond Army, MD  levothyroxine  (SYNTHROID ) 75 MCG tablet Take 1 tablet by mouth daily. 09/22/22   [provider]  losartan  (COZAAR ) 50 MG tablet Take 50 mg by mouth daily.    [provider]  magnesium  oxide (MAG-OX) 400 MG tablet Take 400 mg by mouth daily. 03/01/17   [provider]  meclizine (ANTIVERT) 25 MG tablet Take 25 mg by mouth daily.    [provider]  methocarbamol  (ROBAXIN ) 500 MG tablet Take 1 tablet (500 mg total) by mouth 2 (two) times daily as needed for muscle spasms. 05/25/21   Joldersma, Logan, PA-C  mometasone (ELOCON) 0.1 % cream Apply 1 Application topically daily as needed (Dermatitis). 10/27/23   [provider]  omeprazole (PRILOSEC) 40 MG capsule Take 40 mg by mouth daily.    [provider]  ondansetron  (ZOFRAN ) 4 MG tablet Take 1 tablet (4 mg total) by mouth every 6 (six) hours as needed for nausea. 01/09/24   Lorita Rosa, MD  oxyCODONE  (OXY IR/ROXICODONE ) 5 MG immediate release tablet Take 1 tablet (5 mg total) by mouth every 4 (four) hours as needed for severe pain (pain score 7-10). 01/09/24   Lorita Rosa, MD  polycarbophil (FIBERCON) 625 MG tablet Take 1 tablet (625 mg total) by mouth 2 (two) times daily. 01/09/24   Lorita Rosa, MD  polyethylene glycol  powder (GLYCOLAX /MIRALAX ) 17 GM/SCOOP powder Take 17 g by mouth 2 (two) times daily as needed for moderate constipation. 08/15/15   [provider]  sitaGLIPtin (JANUVIA) 50 MG tablet Take 50 mg by mouth daily. 10/28/22   [provider]  traMADol  (ULTRAM ) 50 MG tablet Take 50 mg by mouth 2 (two) times daily as needed for moderate pain (pain score 4-6).    [provider]    Physical Exam: BP (!) 197/97 (BP Location: Left Arm)   Pulse 89   Temp 97.6 F (36.4 C) (Oral)   Resp 15   SpO2 94%  General:  Alert, oriented, calm, in no acute distress, appears her stated age, in some mild discomfort from abdominal pain Cardiovascular: RRR, no murmurs or rubs, no peripheral edema  Respiratory: clear to auscultation bilaterally, no wheezes, no crackles  Abdomen: soft, diffusely quite tender with voluntary guarding, minimally distended, normal bowel tones heard  Skin: dry, no rashes  Musculoskeletal: no joint effusions, normal range of motion  Psychiatric: appropriate affect, normal speech  Neurologic: extraocular muscles intact, clear speech, moving all extremities with intact sensorium         Labs on Admission:  Basic Metabolic Panel: Recent Labs  Lab 01/04/24 1150 01/05/24 1105 01/06/24 0505 01/07/24 0431 01/08/24 0544 01/11/24 0356  NA 133* 135 138 135 137 135  K 3.6 3.3* 3.5 3.4* 3.7 3.5  CL 100 101 106 104 104 99  CO2 20* 24 26 23 25 24   GLUCOSE 86 158* 96 103* 88 225*  BUN 12 12 8  6* 6* 13  CREATININE 1.45* 1.59* 1.19* 1.06* 1.14* 1.20*  CALCIUM 9.3 9.2 9.0 8.5* 9.0 9.9  MG 1.7  --   --   --   --   --   PHOS 2.5  --   --   --   --   --    Liver Function Tests: Recent Labs  Lab 01/04/24 1150 01/11/24 0356  AST  --  297*  ALT  --  107*  ALKPHOS  --  245*  BILITOT  --  1.2  PROT  --  8.0  ALBUMIN 3.5 3.6   Recent Labs  Lab 01/11/24 0356  LIPASE 6,697*   No results for input(s): "AMMONIA" in the last 168 hours. CBC: Recent Labs  Lab  01/04/24 1150 01/05/24 1105 01/06/24 0505 01/07/24 0431 01/08/24 0544 01/11/24 0356  WBC 10.0 6.0 5.1 5.7 5.2 12.1*  NEUTROABS 6.9 4.1 2.7 3.6 2.8  --   HGB 13.0 11.9* 11.0* 10.8* 10.9* 13.2  HCT 39.4 36.2 33.6* 33.9* 33.0* 40.3  MCV 92.5 92.3 93.1 93.6 92.4 92.4  PLT 260 245 240 253 271 453*   Cardiac Enzymes: No results for input(s): "CKTOTAL", "CKMB", "CKMBINDEX", "TROPONINI" in the last 168 hours. BNP (last 3 results) No results for input(s): "BNP" in the last 8760 hours.  ProBNP (last 3 results) No results for input(s): "PROBNP" in the last 8760 hours.  CBG: Recent Labs  Lab 01/08/24 1118 01/08/24 1544 01/08/24 2102 01/09/24 0730 01/09/24 1126  GLUCAP 94 142* 126* 92 73    Radiological Exams on Admission: CT ABDOMEN PELVIS W CONTRAST Result Date: 01/11/2024 CLINICAL DATA:  Abdominal pain acute nonlocalized EXAM: CT ABDOMEN AND PELVIS WITH CONTRAST TECHNIQUE: Multidetector CT imaging of the abdomen and pelvis was performed using the standard protocol following bolus administration of intravenous contrast. RADIATION DOSE REDUCTION: This exam was performed according to the departmental dose-optimization program which includes automated exposure control, adjustment of the mA and/or kV according to patient size and/or use of iterative reconstruction technique. CONTRAST:  75mL OMNIPAQUE  IOHEXOL  300 MG/ML  SOLN COMPARISON:  Jan 02, 2024 FINDINGS: Lower chest: Chronic basilar hypoventilatory atelectatic changes with mild bronchial dilatation could correlate with chronic bronchitis. Hepatobiliary: Liver normal size shape position without parenchymal lesions or biliary dilatation gallbladder appears mildly distended with gallstones in the neck of the gallbladder and mild pericholecystic fluid which could correlate with a acute cholecystitis. Inflammatory changes extend from the neck of the gallbladder inferiorly and surrounds the second portion of the duodenal. Small amount of fluid  along the anterior lateral paracolic fossa stents inferiorly into the lower abdomen. The pancreas appears also slightly enlarged prominent along the head of the pancreas with a small amount of fluid surrounding the inferior margin of the body of the pancreas findings that correlate also with pancreatitis. Gallbladder ultrasound as well as biliary study recommended for evaluation of gallbladder exclude acute cholecystitis. No obvious choledocholithiasis noted on these images. No drainable  peripancreatic fluid collections no pseudocyst formation Spleen: Normal in size without focal abnormality. Adrenals/Urinary Tract: Adrenal glands are unremarkable. Kidneys are normal, without renal calculi, focal lesion, or hydronephrosis. Bladder is unremarkable. Stomach/Bowel: Stomach is within normal limits. Appendix appears normal. No evidence of bowel wall thickening, distention, or inflammatory changes. Diffuse diverticulosis of the descending colon without diverticulitis Vascular/Lymphatic: Aortic atherosclerosis. No enlarged abdominal or pelvic lymph nodes. Reproductive: The uterus demonstrates a hypodense mass in the central portion of the uterus a 3.2 by 3.3 by 4.1 cm which could correlate with a intramural fibroid. Other: No abdominal wall hernia or abnormality. No abdominopelvic ascites. Musculoskeletal: No fracture is seen.  Prior right hip arthroplasty IMPRESSION: *Findings consistent with acute cholecystitis and pancreatitis. Gallbladder ultrasound as well as biliary study recommended for evaluation of gallbladder exclude acute cholecystitis. *No obvious choledocholithiasis noted on these images. *No drainable peripancreatic fluid collections or pseudocyst formation. *Diverticulosis of the descending colon without diverticulitis. *Uterine fibroid. *Chronic basilar hypoventilatory atelectatic changes with mild bronchial dilatation could correlate with chronic bronchitis. *Aortic atherosclerosis. Electronically Signed    By: Fredrich Jefferson M.D.   On: 01/11/2024 07:38   Assessment/Plan Alexandria Durham is a 78 y.o. female with medical history significant for hypertension, CKD stage III, DM, hyperlipidemia, hypothyroidism who was recently discharged on 01/09/2024 from [facility] after a stay for suspected acute diverticulitis and significant abdominal pain requiring D&C complicated by perforation who is now being admitted to the hospital with severe abdominal pain, nausea and vomiting found to have acute cholecystitis and suspected gallstone pancreatitis.   Acute pancreatitis-with significantly elevated lipase, CT findings as above with acute cholecystitis and pancreatitis.  While no choledocholithiasis is evident on CT scan, there are stones in the neck of the gallbladder with evidence of acute cholecystitis as well.  Presumably be gallstone pancreatitis. -Inpatient admission -N.p.o. except for ice chips and meds -IV fluids -Pain and nausea medication as needed -ER provider has consulted gastroenterology.  She may require MRCP and potentially ERCP.  Acute cholecystitis-suspected due to imaging appearance as above, patient does not have clear right upper quadrant pain. -Empiric IV Rocephin  and Flagyl  -ER provider has consulted general surgery -Anticipate she may require cholecystectomy eventually  Abnormal LFTs-likely due to suspected gallstone pancreatitis, will trend with daily labs, avoid hepatotoxins such as Tylenol  in the meantime  CKD stage III-renal function appears to be at baseline  DM2-not insulin -dependent -Sliding scale insulin  every 4 hours while n.p.o.  Hypertension-continue home HCTZ, blood pressure currently elevated likely due to pain  Hypothyroidism-Synthroid   Suspected endometritis-she was discharged home on a 10-day course of oral doxycycline , we will continue this for the time being  DVT prophylaxis: Lovenox      Code Status: Full Code  Consults called: Gastroenterology, general  surgery  Admission status: The appropriate patient status for this patient is INPATIENT. Inpatient status is judged to be reasonable and necessary in order to provide the required intensity of service to ensure the patient's safety. The patient's presenting symptoms, physical exam findings, and initial radiographic and laboratory data in the context of their chronic comorbidities is felt to place them at high risk for further clinical deterioration. Furthermore, it is not anticipated that the patient will be medically stable for discharge from the hospital within 2 midnights of admission.    I certify that at the point of admission it is my clinical judgment that the patient will require inpatient hospital care spanning beyond 2 midnights from the point of admission due to high intensity of  service, high risk for further deterioration and high frequency of surveillance required  Time spent: 58 minutes  Derrius Furtick Rickey Charm MD [facility] Pager [phone]  If 7PM-7AM, please contact night-coverage [email or web address] Password [facility]  01/11/2024, 9:23 AM

## 2024-01-11 NOTE — ED Provider Notes (Signed)
  Physical Exam  BP (!) 165/138 (BP Location: Left Arm) Comment: Pt wont keep arm still  Pulse 88   Temp 97.7 F (36.5 C) (Oral)   Resp 16   SpO2 100%   Physical Exam  Procedures  Procedures  ED Course / MDM    Medical Decision Making Amount and/or Complexity of Data Reviewed Labs: ordered. Radiology: ordered.  Risk Prescription drug management. Decision regarding hospitalization.   78 yo female with recent admission for diverticulitis and uterus perforated withprocedure, D/c's on doxy, now has pancreatitis, lipase 7,000. ABX started-cefepime  and flagyl  Awaiting ct and will need admission VSS Patient with peritoneal signs IV fluids infused CT results reviewed Discussed with general surgery- will see in consult Plan consult to hospitalist for admission and on call gi 9:03 AM  Discussed with Dr. Lowell Rude on for hospitalist and will see for admission GI paged 9:05 AM Discussed with gastroenterology Discussed with Dr. Alberteen Aloe who will see in consultation.        Auston Blush, MD 01/11/24 [phone]

## 2024-01-11 NOTE — Consult Note (Signed)
 Consult Note  Alexandria Durham 1945/09/28  [id number].    Requesting MD: Auston Blush, MD Chief Complaint/Reason for Consult: Biliary pancreatitis  HPI:  Patient is a 78 year old female who was just discharged from the hospital 5/11 after uterine perforation and possible diverticulitis. She started having more upper abdominal pain and nausea and vomiting Monday night leading her daughter to bring her to the ED for evaluation early this AM. She had still been having stools that were loose but denies any bloody stools or hematemesis. He pain currently is all over but more intense in sides at the time of my examination. During her last admission general surgery was initially consulted for possible diverticulitis, she ultimately was found to have a uterine perforation after endometrial biopsy and was taken to the OR with GYN for repair of this on 5/8. General surgery assisted with laparoscopic lysis of adhesions. Tubal ligation is the only other abdominal surgery. PMH otherwise significant for HTN, HLD, and T2DM. Patient does not take any blood thinners. She is allergic to ASA, PCNs, codeine and ACE-I. He daughter is at bedside this AM.   ROS: Negative other than HPI  History reviewed. No pertinent family history.  Past Medical History:  Diagnosis Date   Arthritis    Diabetes mellitus    Hypercholesteremia    Hypertension     Past Surgical History:  Procedure Laterality Date   BREAST CYST EXCISION     HEMORRHOID SURGERY     HIP SURGERY     HYSTEROSCOPY WITH D & C N/A 01/06/2024   Procedure: DILATATION AND CURETTAGE /HYSTEROSCOPY;  Surgeon: [name], MD;  Location: [facility];  Service: Gynecology;  Laterality: N/A;  PAP SMEAR (DR TO BRING SUPPLIES)  TUCKERS CARD   JOINT REPLACEMENT     hip replacement   LAPAROSCOPIC LYSIS OF ADHESIONS  01/06/2024   Procedure: LYSIS, ADHESIONS, LAPAROSCOPIC;  Surgeon: [name], MD;  Location: [facility];  Service: Gynecology;;   LAPAROSCOPY N/A  01/06/2024   Procedure: LAPAROSCOPY, DIAGNOSTIC;  Surgeon: [name], MD;  Location: [facility];  Service: Gynecology;  Laterality: N/A;   TONSILLECTOMY     TUBAL LIGATION      Social History:  reports that she has been smoking cigarettes. She has never been exposed to tobacco smoke. She has never used smokeless tobacco. She reports that she does not drink alcohol and does not use drugs.  Allergies:  Allergies  Allergen Reactions   Ace Inhibitors Hives and Itching   Aspirin Palpitations   Codeine Palpitations   Penicillins Rash    Medications Prior to Admission  Medication Sig Dispense Refill   acetaminophen  (TYLENOL ) 500 MG tablet Take 1,000 mg by mouth every 6 (six) hours as needed for moderate pain.     ammonium lactate (AMLACTIN) 12 % cream Apply 1 Application topically as needed for dry skin.     bisacodyl 5 MG EC tablet Take 5 mg by mouth daily as needed for moderate constipation.     buPROPion  (WELLBUTRIN  XL) 150 MG 24 hr tablet Take 150 mg by mouth every evening.     Cholecalciferol  (VITAMIN D3) 25 MCG (1000 UT) CAPS Take 1,000 Units by mouth daily. (Patient not taking: Reported on 01/03/2024)     diphenhydramine -acetaminophen  (TYLENOL  PM) 25-500 MG TABS tablet Take 2 tablets by mouth at bedtime as needed (Sleep/Pain).     doxycycline  (VIBRA -TABS) 100 MG tablet Take 1 tablet (100 mg total) by mouth every 12 (twelve) hours  for 10 days. 20 tablet 0   ezetimibe  (ZETIA ) 10 MG tablet Take 1 tablet by mouth daily.     Ferrous Sulfate (IRON PO) Take 1 tablet by mouth daily.     fluticasone  (FLONASE ) 50 MCG/ACT nasal spray Place 2 sprays into both nostrils daily. (Patient taking differently: Place 2 sprays into both nostrils daily as needed for rhinitis.) 16 g 2   gabapentin  (NEURONTIN ) 300 MG capsule Take 600 mg by mouth 2 (two) times daily.     hydrochlorothiazide  (HYDRODIURIL ) 25 MG tablet Take 1 tablet (25 mg total) by mouth daily. 30 tablet 0   HYDROcodone -homatropine (HYCODAN) 5-1.5  MG/5ML syrup Take 5 mLs by mouth every 6 (six) hours as needed for cough. 120 mL 0   levothyroxine  (SYNTHROID ) 75 MCG tablet Take 1 tablet by mouth daily.     losartan  (COZAAR ) 50 MG tablet Take 50 mg by mouth daily.     magnesium  oxide (MAG-OX) 400 MG tablet Take 400 mg by mouth daily.     meclizine (ANTIVERT) 25 MG tablet Take 25 mg by mouth daily.     methocarbamol  (ROBAXIN ) 500 MG tablet Take 1 tablet (500 mg total) by mouth 2 (two) times daily as needed for muscle spasms. 14 tablet 0   mometasone (ELOCON) 0.1 % cream Apply 1 Application topically daily as needed (Dermatitis).     omeprazole (PRILOSEC) 40 MG capsule Take 40 mg by mouth daily.     ondansetron  (ZOFRAN ) 4 MG tablet Take 1 tablet (4 mg total) by mouth every 6 (six) hours as needed for nausea. 20 tablet 0   oxyCODONE  (OXY IR/ROXICODONE ) 5 MG immediate release tablet Take 1 tablet (5 mg total) by mouth every 4 (four) hours as needed for severe pain (pain score 7-10). 20 tablet 0   polycarbophil (FIBERCON) 625 MG tablet Take 1 tablet (625 mg total) by mouth 2 (two) times daily. 60 tablet 0   polyethylene glycol powder (GLYCOLAX /MIRALAX ) 17 GM/SCOOP powder Take 17 g by mouth 2 (two) times daily as needed for moderate constipation.     sitaGLIPtin (JANUVIA) 50 MG tablet Take 50 mg by mouth daily.     traMADol  (ULTRAM ) 50 MG tablet Take 50 mg by mouth 2 (two) times daily as needed for moderate pain (pain score 4-6).      Blood pressure (!) 203/87, pulse 84, temperature 97.9 F (36.6 C), temperature source Oral, resp. rate 20, SpO2 96%. Physical Exam:  General: pleasant, WD, WN female who is laying in bed and appears uncomfortable HEENT: head is normocephalic, atraumatic.  Sclera are anicteric Heart: regular, rate, and rhythm. Palpable radial and pedal pulses bilaterally Lungs: CTAB, no wheezes, rhonchi, or rales noted.  Respiratory effort nonlabored Abd: soft, generalized ttp, mild distention  MS: all 4 extremities are symmetrical  with no cyanosis, clubbing, or edema. Skin: warm and dry with no masses, lesions, or rashes Neuro: Cranial nerves 2-12 grossly intact, sensation is normal throughout Psych: A&Ox3 with an appropriate affect.   Results for orders placed or performed during the hospital encounter of 01/11/24 (from the past 48 hours)  Lipase, blood     Status: Abnormal   Collection Time: 01/11/24  3:56 AM  Result Value Ref Range   Lipase 6,697 (H) 11 - 51 U/L    Comment: RESULT CONFIRMED BY MANUAL DILUTION Performed at [facility], [address], [place], [place] [address]   Comprehensive metabolic panel     Status: Abnormal   Collection Time: 01/11/24  3:56 AM  Result Value Ref Range   Sodium 135 135 - 145 mmol/L   Potassium 3.5 3.5 - 5.1 mmol/L   Chloride 99 98 - 111 mmol/L   CO2 24 22 - 32 mmol/L   Glucose, Bld 225 (H) 70 - 99 mg/dL    Comment: Glucose reference range applies only to samples taken after fasting for at least 8 hours.   BUN 13 8 - 23 mg/dL   Creatinine, Ser 5.28 (H) 0.44 - 1.00 mg/dL   Calcium 9.9 8.9 - 41.3 mg/dL   Total Protein 8.0 6.5 - 8.1 g/dL   Albumin 3.6 3.5 - 5.0 g/dL   AST 244 (H) 15 - 41 U/L   ALT 107 (H) 0 - 44 U/L   Alkaline Phosphatase 245 (H) 38 - 126 U/L   Total Bilirubin 1.2 0.0 - 1.2 mg/dL   GFR, Estimated 47 (L) >60 mL/min    Comment: (NOTE) Calculated using the CKD-EPI Creatinine Equation (2021)    Anion gap 12 5 - 15    Comment: Performed at [facility], [address], [place], [place] [address]  CBC     Status: Abnormal   Collection Time: 01/11/24  3:56 AM  Result Value Ref Range   WBC 12.1 (H) 4.0 - 10.5 K/uL   RBC 4.36 3.87 - 5.11 MIL/uL   Hemoglobin 13.2 12.0 - 15.0 g/dL   HCT 25.3 66.4 - 40.3 %   MCV 92.4 80.0 - 100.0 fL   MCH 30.3 26.0 - 34.0 pg   MCHC 32.8 30.0 - 36.0 g/dL   RDW 47.4 25.9 - 56.3 %   Platelets 453 (H) 150 - 400 K/uL   nRBC 0.0 0.0 - 0.2 %    Comment: Performed at [facility], [address] [place], [place] [address]  Troponin I (High Sensitivity)     Status: None   Collection Time: 01/11/24  3:56 AM  Result Value Ref Range   Troponin I (High Sensitivity) 7 <18 ng/L    Comment: (NOTE) Elevated high sensitivity troponin I (hsTnI) values and significant  changes across serial measurements may suggest ACS but many other  chronic and acute conditions are known to elevate hsTnI results.  Refer to the "Links" section for chest pain algorithms and additional  guidance. Performed at [facility], [address], [place], [place] [address]   I-Stat Lactic Acid     Status: None   Collection Time: 01/11/24  7:05 AM  Result Value Ref Range   Lactic Acid, Venous 1.3 0.5 - 1.9 mmol/L  Urinalysis, Routine w reflex microscopic -Urine, Clean Catch     Status: Abnormal   Collection Time: 01/11/24  7:11 AM  Result Value Ref Range   Color, Urine YELLOW YELLOW   APPearance CLEAR CLEAR   Specific Gravity, Urine 1.010 1.005 - 1.030   pH 5.0 5.0 - 8.0   Glucose, UA 50 (A) NEGATIVE mg/dL   Hgb urine dipstick MODERATE (A) NEGATIVE   Bilirubin Urine NEGATIVE NEGATIVE   Ketones, ur 5 (A) NEGATIVE mg/dL   Protein, ur NEGATIVE NEGATIVE mg/dL   Nitrite NEGATIVE NEGATIVE   Leukocytes,Ua NEGATIVE NEGATIVE   RBC / HPF 0-5 0 - 5 RBC/hpf   WBC, UA 0-5 0 - 5 WBC/hpf   Bacteria, UA NONE SEEN NONE SEEN   Squamous Epithelial / HPF 6-10 0 - 5 /HPF   Mucus PRESENT    Hyaline Casts, UA PRESENT  Comment: Performed at [facility], [address], [place], [place] [address]  Glucose, capillary     Status: Abnormal   Collection Time: 01/11/24 11:26 AM  Result Value Ref Range   Glucose-Capillary 137 (H) 70 - 99 mg/dL    Comment: Glucose reference range applies only to samples taken after fasting for at least 8 hours.   Comment 1 Notify RN    US  ABDOMEN LIMITED RUQ (LIVER/GB) Result Date: 01/11/2024 CLINICAL DATA:  6216 Acute  pancreatitis 6216 EXAM: ULTRASOUND ABDOMEN LIMITED RIGHT UPPER QUADRANT COMPARISON:  CT scan abdomen and pelvis from earlier the same day FINDINGS: The technologist noted limited exam due to body habitus, bowel gas and other patient related factors. Gallbladder: The gallbladder is physiologically distended. There is mild to moderate diffuse circumferential wall thickening. No gallstones or sludge seen. No pericholecystic free fluid. The technologist noted positive sonographic Murphy's sign. Common bile duct: Diameter: Up to 2.5 mm.  No intrahepatic bile duct dilation. Liver: There is poor sound beam penetration to the deep / posterior aspects of the liver as a result of increased hepatic echogenicity which reduces the sensitivity of ultrasound for the detection of focal masses. That being said, no focal mass is identified. Portal vein is patent on color Doppler imaging with normal direction of blood flow towards the liver. Other: None. IMPRESSION: 1. There is mild-to-moderate diffuse circumferential wall thickening of the gallbladder. No gallstones or sludge seen. No pericholecystic free fluid. The technologist noted positive sonographic Murphy's sign. Findings are equivocal for acute cholecystitis. If clinical suspicion is high, further evaluation with nuclear medicine HIDA scan is recommended. 2. Increased hepatic echogenicity, a nonspecific finding that is most commonly seen on the basis of steatosis in the absence of known liver disease. 3. Otherwise unremarkable exam. Electronically Signed   By: Beula Brunswick M.D.   On: 01/11/2024 09:57   CT ABDOMEN PELVIS W CONTRAST Result Date: 01/11/2024 CLINICAL DATA:  Abdominal pain acute nonlocalized EXAM: CT ABDOMEN AND PELVIS WITH CONTRAST TECHNIQUE: Multidetector CT imaging of the abdomen and pelvis was performed using the standard protocol following bolus administration of intravenous contrast. RADIATION DOSE REDUCTION: This exam was performed according to the  departmental dose-optimization program which includes automated exposure control, adjustment of the mA and/or kV according to patient size and/or use of iterative reconstruction technique. CONTRAST:  75mL OMNIPAQUE  IOHEXOL  300 MG/ML  SOLN COMPARISON:  Jan 02, 2024 FINDINGS: Lower chest: Chronic basilar hypoventilatory atelectatic changes with mild bronchial dilatation could correlate with chronic bronchitis. Hepatobiliary: Liver normal size shape position without parenchymal lesions or biliary dilatation gallbladder appears mildly distended with gallstones in the neck of the gallbladder and mild pericholecystic fluid which could correlate with a acute cholecystitis. Inflammatory changes extend from the neck of the gallbladder inferiorly and surrounds the second portion of the duodenal. Small amount of fluid along the anterior lateral paracolic fossa stents inferiorly into the lower abdomen. The pancreas appears also slightly enlarged prominent along the head of the pancreas with a small amount of fluid surrounding the inferior margin of the body of the pancreas findings that correlate also with pancreatitis. Gallbladder ultrasound as well as biliary study recommended for evaluation of gallbladder exclude acute cholecystitis. No obvious choledocholithiasis noted on these images. No drainable peripancreatic fluid collections no pseudocyst formation Spleen: Normal in size without focal abnormality. Adrenals/Urinary Tract: Adrenal glands are unremarkable. Kidneys are normal, without renal calculi, focal lesion, or hydronephrosis. Bladder is unremarkable. Stomach/Bowel: Stomach is within normal limits. Appendix appears  normal. No evidence of bowel wall thickening, distention, or inflammatory changes. Diffuse diverticulosis of the descending colon without diverticulitis Vascular/Lymphatic: Aortic atherosclerosis. No enlarged abdominal or pelvic lymph nodes. Reproductive: The uterus demonstrates a hypodense mass in the  central portion of the uterus a 3.2 by 3.3 by 4.1 cm which could correlate with a intramural fibroid. Other: No abdominal wall hernia or abnormality. No abdominopelvic ascites. Musculoskeletal: No fracture is seen.  Prior right hip arthroplasty IMPRESSION: *Findings consistent with acute cholecystitis and pancreatitis. Gallbladder ultrasound as well as biliary study recommended for evaluation of gallbladder exclude acute cholecystitis. *No obvious choledocholithiasis noted on these images. *No drainable peripancreatic fluid collections or pseudocyst formation. *Diverticulosis of the descending colon without diverticulitis. *Uterine fibroid. *Chronic basilar hypoventilatory atelectatic changes with mild bronchial dilatation could correlate with chronic bronchitis. *Aortic atherosclerosis. Electronically Signed   By: Fredrich Jefferson M.D.   On: 01/11/2024 07:38      Assessment/Plan  Biliary pancreatitis and probable acute cholecystitis  - CT with gallstones at the neck of the gallbladder, mild pericholecystic fluid, possible cholecystitis; enlarged and prominent pancreas with small amount of fluid surrounding the inferior margin of the body of the pancreas; diverticulosis without active diverticulitis.  - RUQ US  with mild to moderate gallbladder wall thickening, no gallstones or sludge noted - lipase 6697 on admit, Alk Phos/AST/ALT all mildly elevated. Tbili is 1.2 - leukocytosis of 12.1, afebrile and HD stable  - in light of stones noted in gallbladder neck would recommend abx for cholecystitis  - not ready for laparoscopic cholecystectomy at this time but we will follow for surgical planning  - GI following as well   FEN: NPO, IVF per primary team  VTE: ok to have LMWH or SQH from surgery standpoint  ID: [email or web address]    - per [facility] -  HTN HLD T2DM Recent uterine perforation s/p repair 5/8 by Dr. Vallarie Gauze   LOS: 0 days   I reviewed ED provider notes, Consultant GI notes, hospitalist notes, last 24 h  vitals and pain scores, last 48 h intake and output, last 24 h labs and trends, and last 24 h imaging results.  This care required high  level of medical decision making.    Annetta Killian, [facility] Surgery 01/11/2024, 12:15 PM Please see Amion for pager number during day hours 7:00am-4:30pm

## 2024-01-11 NOTE — Consult Note (Signed)
 Referring Provider: ED Primary Care Physician:  Bayard Limbo, NP Primary Gastroenterologist: Para Bold  Reason for Consultation: Abdominal pain, pancreatitis  HPI: Alexandria Durham is a 78 y.o. female who was admitted recently to the hospital with [facility] related complication with uterine perforation, mild diverticulitis at the junction of descending and sigmoid colon who was discharged on Jan 09, 2024 and treated with antibiotics presented back to the hospital today with worsening abdominal pain. Upon initial evaluation, she was found to have significant elevated lipase at 6697, mildly elevated LFTs with AST 297, ALT 107, alk phos 245 and normal T. bili.  LFTs were normal during last admission.  Normal CBC except for mild elevated white count of 12.1.  CT abdomen pelvis with IV contrast showed mildly distended gallbladder with gallstone in the neck of the gallbladder with pericholecystic fluid with inflammatory changes extending to the second portion of the duodenum with prominent head of the pancreas with small amount of fluid surrounding the body of the pancreas concerning for acute cholecystitis along with pancreatitis.  Patient seen and examined at bedside.  Family member at bedside.  She is complaining of worsening abdominal pain which is mainly located in the epigastric area but also has generalized abdominal pain.  Has some nausea.  Denies any vomiting.  Denies diarrhea or constipation.  Denies blood in the stool or black stool.  Past Medical History:  Diagnosis Date   Arthritis    Diabetes mellitus    Hypercholesteremia    Hypertension     Past Surgical History:  Procedure Laterality Date   BREAST CYST EXCISION     HEMORRHOID SURGERY     HIP SURGERY     HYSTEROSCOPY WITH D & C N/A 01/06/2024   Procedure: DILATATION AND CURETTAGE /HYSTEROSCOPY;  Surgeon: [name], MD;  Location: [facility];  Service: Gynecology;  Laterality: N/A;  PAP SMEAR (DR TO BRING SUPPLIES)  TUCKERS CARD    JOINT REPLACEMENT     hip replacement   LAPAROSCOPIC LYSIS OF ADHESIONS  01/06/2024   Procedure: LYSIS, ADHESIONS, LAPAROSCOPIC;  Surgeon: [name], MD;  Location: [facility];  Service: Gynecology;;   LAPAROSCOPY N/A 01/06/2024   Procedure: LAPAROSCOPY, DIAGNOSTIC;  Surgeon: [name], MD;  Location: [facility];  Service: Gynecology;  Laterality: N/A;   TONSILLECTOMY     TUBAL LIGATION      Prior to Admission medications   Medication Sig Start Date End Date Taking? Authorizing Provider  acetaminophen  (TYLENOL ) 500 MG tablet Take 1,000 mg by mouth every 6 (six) hours as needed for moderate pain.    [provider]  ammonium lactate (AMLACTIN) 12 % cream Apply 1 Application topically as needed for dry skin. 11/21/15   [provider]  bisacodyl 5 MG EC tablet Take 5 mg by mouth daily as needed for moderate constipation.    [provider]  buPROPion  (WELLBUTRIN  XL) 150 MG 24 hr tablet Take 150 mg by mouth every evening.    [provider]  Cholecalciferol  (VITAMIN D3) 25 MCG (1000 UT) CAPS Take 1,000 Units by mouth daily. Patient not taking: Reported on 01/03/2024    [provider]  diphenhydramine -acetaminophen  (TYLENOL  PM) 25-500 MG TABS tablet Take 2 tablets by mouth at bedtime as needed (Sleep/Pain).    [provider]  doxycycline  (VIBRA -TABS) 100 MG tablet Take 1 tablet (100 mg total) by mouth every 12 (twelve) hours for 10 days. 01/09/24 01/19/24  Lorita Rosa, MD  ezetimibe  (ZETIA ) 10 MG tablet Take 1 tablet by  mouth daily. 10/29/23 10/28/24  [provider]  Ferrous Sulfate (IRON PO) Take 1 tablet by mouth daily.    [provider]  fluticasone  (FLONASE ) 50 MCG/ACT nasal spray Place 2 sprays into both nostrils daily. Patient taking differently: Place 2 sprays into both nostrils daily as needed for rhinitis. 10/16/16   Joy, Shawn C, PA-C  gabapentin  (NEURONTIN ) 300 MG capsule Take 600 mg by mouth 2 (two) times daily.  08/03/20   [provider]  hydrochlorothiazide  (HYDRODIURIL ) 25 MG tablet Take 1 tablet (25 mg total) by mouth daily. 01/10/24   Lorita Rosa, MD  HYDROcodone -homatropine (HYCODAN) 5-1.5 MG/5ML syrup Take 5 mLs by mouth every 6 (six) hours as needed for cough. 01/09/18   Almond Army, MD  levothyroxine  (SYNTHROID ) 75 MCG tablet Take 1 tablet by mouth daily. 09/22/22   [provider]  losartan  (COZAAR ) 50 MG tablet Take 50 mg by mouth daily.    [provider]  magnesium  oxide (MAG-OX) 400 MG tablet Take 400 mg by mouth daily. 03/01/17   [provider]  meclizine (ANTIVERT) 25 MG tablet Take 25 mg by mouth daily.    [provider]  methocarbamol  (ROBAXIN ) 500 MG tablet Take 1 tablet (500 mg total) by mouth 2 (two) times daily as needed for muscle spasms. 05/25/21   Joldersma, Logan, PA-C  mometasone (ELOCON) 0.1 % cream Apply 1 Application topically daily as needed (Dermatitis). 10/27/23   [provider]  omeprazole (PRILOSEC) 40 MG capsule Take 40 mg by mouth daily.    [provider]  ondansetron  (ZOFRAN ) 4 MG tablet Take 1 tablet (4 mg total) by mouth every 6 (six) hours as needed for nausea. 01/09/24   Lorita Rosa, MD  oxyCODONE  (OXY IR/ROXICODONE ) 5 MG immediate release tablet Take 1 tablet (5 mg total) by mouth every 4 (four) hours as needed for severe pain (pain score 7-10). 01/09/24   Lorita Rosa, MD  polycarbophil (FIBERCON) 625 MG tablet Take 1 tablet (625 mg total) by mouth 2 (two) times daily. 01/09/24   Lorita Rosa, MD  polyethylene glycol powder (GLYCOLAX /MIRALAX ) 17 GM/SCOOP powder Take 17 g by mouth 2 (two) times daily as needed for moderate constipation. 08/15/15   [provider]  sitaGLIPtin (JANUVIA) 50 MG tablet Take 50 mg by mouth daily. 10/28/22   [provider]  traMADol  (ULTRAM ) 50 MG tablet Take 50 mg by mouth 2 (two) times daily as needed for moderate pain (pain score 4-6).     [provider]    Scheduled Meds: Continuous Infusions:  metronidazole  500 mg (01/11/24 0830)   PRN Meds:.  Allergies as of 01/11/2024 - Review Complete 01/11/2024  Allergen Reaction Noted   Ace inhibitors Hives and Itching 03/08/2016   Aspirin Palpitations 03/31/2011   Codeine Palpitations 03/31/2011   Penicillins Rash 03/31/2011    History reviewed. No pertinent family history.  Social History   Socioeconomic History   Marital status: Single    Spouse name: Not on file   Number of children: Not on file   Years of education: Not on file   Highest education level: Not on file  Occupational History   Not on file  Tobacco Use   Smoking status: Every Day    Current packs/day: 0.50    Types: Cigarettes    Passive exposure: Never   Smokeless tobacco: Never  Substance and Sexual Activity   Alcohol use: No   Drug use: No   Sexual activity: Not on file  Other  Topics Concern   Not on file  Social History Narrative   Not on file   Social Drivers of Health   Financial Resource Strain: Not on file  Food Insecurity: No Food Insecurity (01/03/2024)   Hunger Vital Sign    Worried About Running Out of Food in the Last Year: Never true    Ran Out of Food in the Last Year: Never true  Transportation Needs: No Transportation Needs (01/03/2024)   PRAPARE - Administrator, Civil Service (Medical): No    Lack of Transportation (Non-Medical): No  Physical Activity: Not on file  Stress: Not on file  Social Connections: Unknown (01/04/2024)   Social Connection and Isolation Panel [NHANES]    Frequency of Communication with Friends and Family: Twice a week    Frequency of Social Gatherings with Friends and Family: Twice a week    Attends Religious Services: 1 to 4 times per year    Active Member of [employer] or Organizations: No    Attends Banker Meetings: 1 to 4 times per year    Marital Status: Patient declined  Intimate Partner Violence: Not At Risk  (01/03/2024)   Humiliation, Afraid, Rape, and Kick questionnaire    Fear of Current or Ex-Partner: No    Emotionally Abused: No    Physically Abused: No    Sexually Abused: No    Review of Systems: All negative except as stated above in HPI.  Physical Exam: Vital signs: Vitals:   01/11/24 0600 01/11/24 0732  BP: (!) 165/138 (!) 197/97  Pulse: 88 89  Resp: 16 15  Temp:  97.6 F (36.4 C)  SpO2: 100% 94%     General:   Alert,  Well-developed, well-nourished, pleasant and cooperative in NAD NS, atraumatic, extraocular movement intact Lungs: No visible respiratory distress Heart:  Regular rate and rhythm; no murmurs, clicks, rubs,  or gallops. Abdomen: Epigastric tenderness to palpation with guarding, generalized abdominal discomfort on palpation, bowel sounds present Somewhat anxious Alert and oriented x 3 Rectal:  Deferred  GI:  Lab Results: Recent Labs    01/11/24 0356  WBC 12.1*  HGB 13.2  HCT 40.3  PLT 453*   BMET Recent Labs    01/11/24 0356  NA 135  K 3.5  CL 99  CO2 24  GLUCOSE 225*  BUN 13  CREATININE 1.20*  CALCIUM 9.9   LFT Recent Labs    01/11/24 0356  PROT 8.0  ALBUMIN 3.6  AST 297*  ALT 107*  ALKPHOS 245*  BILITOT 1.2   PT/INR No results for input(s): "LABPROT", "INR" in the last 72 hours.   Studies/Results: CT ABDOMEN PELVIS W CONTRAST Result Date: 01/11/2024 CLINICAL DATA:  Abdominal pain acute nonlocalized EXAM: CT ABDOMEN AND PELVIS WITH CONTRAST TECHNIQUE: Multidetector CT imaging of the abdomen and pelvis was performed using the standard protocol following bolus administration of intravenous contrast. RADIATION DOSE REDUCTION: This exam was performed according to the departmental dose-optimization program which includes automated exposure control, adjustment of the mA and/or kV according to patient size and/or use of iterative reconstruction technique. CONTRAST:  75mL OMNIPAQUE  IOHEXOL  300 MG/ML  SOLN COMPARISON:  Jan 02, 2024  FINDINGS: Lower chest: Chronic basilar hypoventilatory atelectatic changes with mild bronchial dilatation could correlate with chronic bronchitis. Hepatobiliary: Liver normal size shape position without parenchymal lesions or biliary dilatation gallbladder appears mildly distended with gallstones in the neck of the gallbladder and mild pericholecystic fluid which could correlate with a acute cholecystitis. Inflammatory changes  extend from the neck of the gallbladder inferiorly and surrounds the second portion of the duodenal. Small amount of fluid along the anterior lateral paracolic fossa stents inferiorly into the lower abdomen. The pancreas appears also slightly enlarged prominent along the head of the pancreas with a small amount of fluid surrounding the inferior margin of the body of the pancreas findings that correlate also with pancreatitis. Gallbladder ultrasound as well as biliary study recommended for evaluation of gallbladder exclude acute cholecystitis. No obvious choledocholithiasis noted on these images. No drainable peripancreatic fluid collections no pseudocyst formation Spleen: Normal in size without focal abnormality. Adrenals/Urinary Tract: Adrenal glands are unremarkable. Kidneys are normal, without renal calculi, focal lesion, or hydronephrosis. Bladder is unremarkable. Stomach/Bowel: Stomach is within normal limits. Appendix appears normal. No evidence of bowel wall thickening, distention, or inflammatory changes. Diffuse diverticulosis of the descending colon without diverticulitis Vascular/Lymphatic: Aortic atherosclerosis. No enlarged abdominal or pelvic lymph nodes. Reproductive: The uterus demonstrates a hypodense mass in the central portion of the uterus a 3.2 by 3.3 by 4.1 cm which could correlate with a intramural fibroid. Other: No abdominal wall hernia or abnormality. No abdominopelvic ascites. Musculoskeletal: No fracture is seen.  Prior right hip arthroplasty IMPRESSION: *Findings  consistent with acute cholecystitis and pancreatitis. Gallbladder ultrasound as well as biliary study recommended for evaluation of gallbladder exclude acute cholecystitis. *No obvious choledocholithiasis noted on these images. *No drainable peripancreatic fluid collections or pseudocyst formation. *Diverticulosis of the descending colon without diverticulitis. *Uterine fibroid. *Chronic basilar hypoventilatory atelectatic changes with mild bronchial dilatation could correlate with chronic bronchitis. *Aortic atherosclerosis. Electronically Signed   By: Fredrich Jefferson M.D.   On: 01/11/2024 07:38    Impression/Plan: -Acute pancreatitis could be gallstone versus medication induced. - Possible acute cholecystitis - Recent history of uterine perforation during D&C - History of diverticulitis earlier this month  Recommendations ------------------------- - Recommend to start IV hydration with normal saline to 250 cc/h for 2 L followed by 150 cc/h until discontinued - Check ultrasound right upper quadrant - Keep n.p.o. for now - Repeat labs in the morning - Surgery consult has been placed by ED physician - GI will follow - Discussed with patient and family at bedside.    LOS: 0 days   Felecia Hopper  MD, FACP 01/11/2024, 9:11 AM  Contact #  [phone]

## 2024-01-11 NOTE — ED Notes (Signed)
 Pt attempted to void without success via bedpan.

## 2024-01-11 NOTE — Plan of Care (Signed)

## 2024-01-11 NOTE — Telephone Encounter (Signed)
 Left message on voicemail for patient to call back and schedule new patient visit.  We are trying place her on 6/6 with Dr Orvil Bland.

## 2024-01-11 NOTE — ED Provider Notes (Signed)
 Alexandria Durham EMERGENCY DEPARTMENT AT [facility] Provider Note   CSN: [id number] Arrival date & time: 01/11/24  0340     History  Chief Complaint  Patient presents with   Abdominal Pain    Alexandria Durham is a 78 y.o. female.  The history is provided by the patient, a relative and medical records.  Abdominal Pain Alexandria Durham is a 78 y.o. female who presents to the Emergency Department complaining of abdominal pain.  She presents to the emergency department for evaluation of severe and generalized abdominal pain.  She was just discharged from the hospital on Sunday following admission for a diverticulitis as well as D&C complicated by uterine perforation.  She was discharged home on antibiotics but was not able to fill them until today.  She has been experiencing vomiting, diarrhea.  No fever, dysuria, vaginal discharge.  She has been taking oxycodone  with no improvement in her pain.      Home Medications Prior to Admission medications   Medication Sig Start Date End Date Taking? Authorizing Provider  acetaminophen  (TYLENOL ) 500 MG tablet Take 1,000 mg by mouth every 6 (six) hours as needed for moderate pain.    [provider]  ammonium lactate (AMLACTIN) 12 % cream Apply 1 Application topically as needed for dry skin. 11/21/15   [provider]  bisacodyl 5 MG EC tablet Take 5 mg by mouth daily as needed for moderate constipation.    [provider]  buPROPion  (WELLBUTRIN  XL) 150 MG 24 hr tablet Take 150 mg by mouth every evening.    [provider]  Cholecalciferol  (VITAMIN D3) 25 MCG (1000 UT) CAPS Take 1,000 Units by mouth daily. Patient not taking: Reported on 01/03/2024    [provider]  diphenhydramine -acetaminophen  (TYLENOL  PM) 25-500 MG TABS tablet Take 2 tablets by mouth at bedtime as needed (Sleep/Pain).    [provider]  doxycycline  (VIBRA -TABS) 100 MG tablet Take 1 tablet (100 mg total) by mouth every 12  (twelve) hours for 10 days. 01/09/24 01/19/24  Lorita Rosa, MD  ezetimibe  (ZETIA ) 10 MG tablet Take 1 tablet by mouth daily. 10/29/23 10/28/24  [provider]  Ferrous Sulfate (IRON PO) Take 1 tablet by mouth daily.    [provider]  fluticasone  (FLONASE ) 50 MCG/ACT nasal spray Place 2 sprays into both nostrils daily. Patient taking differently: Place 2 sprays into both nostrils daily as needed for rhinitis. 10/16/16   Joy, Shawn C, PA-C  gabapentin  (NEURONTIN ) 300 MG capsule Take 600 mg by mouth 2 (two) times daily. 08/03/20   [provider]  hydrochlorothiazide  (HYDRODIURIL ) 25 MG tablet Take 1 tablet (25 mg total) by mouth daily. 01/10/24   Lorita Rosa, MD  HYDROcodone -homatropine (HYCODAN) 5-1.5 MG/5ML syrup Take 5 mLs by mouth every 6 (six) hours as needed for cough. 01/09/18   Almond Army, MD  levothyroxine  (SYNTHROID ) 75 MCG tablet Take 1 tablet by mouth daily. 09/22/22   [provider]  losartan  (COZAAR ) 50 MG tablet Take 50 mg by mouth daily.    [provider]  magnesium  oxide (MAG-OX) 400 MG tablet Take 400 mg by mouth daily. 03/01/17   [provider]  meclizine (ANTIVERT) 25 MG tablet Take 25 mg by mouth daily.    [provider]  methocarbamol  (ROBAXIN ) 500 MG tablet Take 1 tablet (500 mg total) by mouth 2 (two) times daily as needed for muscle spasms. 05/25/21   Joldersma, Logan, PA-C  mometasone (ELOCON) 0.1 % cream  Apply 1 Application topically daily as needed (Dermatitis). 10/27/23   [provider]  omeprazole (PRILOSEC) 40 MG capsule Take 40 mg by mouth daily.    [provider]  ondansetron  (ZOFRAN ) 4 MG tablet Take 1 tablet (4 mg total) by mouth every 6 (six) hours as needed for nausea. 01/09/24   Lorita Rosa, MD  oxyCODONE  (OXY IR/ROXICODONE ) 5 MG immediate release tablet Take 1 tablet (5 mg total) by mouth every 4 (four) hours as needed for severe pain (pain score 7-10). 01/09/24   Lorita Rosa, MD  polycarbophil (FIBERCON) 625 MG tablet Take 1 tablet (625 mg total) by mouth 2 (two) times daily. 01/09/24   Lorita Rosa, MD  polyethylene glycol powder (GLYCOLAX /MIRALAX ) 17 GM/SCOOP powder Take 17 g by mouth 2 (two) times daily as needed for moderate constipation. 08/15/15   [provider]  sitaGLIPtin (JANUVIA) 50 MG tablet Take 50 mg by mouth daily. 10/28/22   [provider]  traMADol  (ULTRAM ) 50 MG tablet Take 50 mg by mouth 2 (two) times daily as needed for moderate pain (pain score 4-6).    [provider]      Allergies    Ace inhibitors, Aspirin, Codeine, and Penicillins    Review of Systems   Review of Systems  Gastrointestinal:  Positive for abdominal pain.  All other systems reviewed and are negative.   Physical Exam Updated Vital Signs BP (!) 197/97 (BP Location: Left Arm)   Pulse 89   Temp 97.6 F (36.4 C) (Oral)   Resp 15   SpO2 94%  Physical Exam Vitals and nursing note reviewed.  Constitutional:      Appearance: She is well-developed.  HENT:     Head: Normocephalic and atraumatic.  Cardiovascular:     Rate and Rhythm: Normal rate and regular rhythm.  Pulmonary:     Effort: Pulmonary effort is normal. No respiratory distress.  Abdominal:     Palpations: Abdomen is soft.     Tenderness: There is abdominal tenderness. There is guarding. There is no rebound.     Comments: Moderate generalized abdominal tenderness  Musculoskeletal:        General: No swelling or tenderness.  Skin:    General: Skin is warm and dry.  Neurological:     Mental Status: She is alert and oriented to person, place, and time.  Psychiatric:        Behavior: Behavior normal.     ED Results / Procedures / Treatments   Labs (all labs ordered are listed, but only abnormal results are displayed) Labs Reviewed  LIPASE, BLOOD - Abnormal; Notable for the following components:      Result Value   Lipase 6,697 (*)    All other components  within normal limits  COMPREHENSIVE METABOLIC PANEL WITH GFR - Abnormal; Notable for the following components:   Glucose, Bld 225 (*)    Creatinine, Ser 1.20 (*)    AST 297 (*)    ALT 107 (*)    Alkaline Phosphatase 245 (*)    GFR, Estimated 47 (*)    All other components within normal limits  CBC - Abnormal; Notable for the following components:   WBC 12.1 (*)    Platelets 453 (*)    All other components within normal limits  URINALYSIS, ROUTINE W REFLEX MICROSCOPIC - Abnormal; Notable for the following components:   Glucose, UA 50 (*)    Hgb urine dipstick MODERATE (*)    Ketones, ur 5 (*)  All other components within normal limits  I-STAT CG4 LACTIC ACID, ED  TROPONIN I (HIGH SENSITIVITY)    EKG EKG Interpretation Date/Time:  Tuesday Jan 11 2024 03:57:25 EDT Ventricular Rate:  89 PR Interval:  174 QRS Duration:  93 QT Interval:  363 QTC Calculation: 442 R Axis:   200  Text Interpretation: Sinus rhythm Abnormal R-wave progression, early transition Inferior infarct, 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  Critical care was necessary to treat or prevent imminent or life-threatening deterioration.  Critical care was time spent personally by me on the following activities: development of treatment plan with patient and/or surrogate as well as nursing, discussions with consultants, evaluation of patient's response to treatment, examination of patient, obtaining history from patient or surrogate, ordering and performing treatments and interventions, ordering and review of laboratory studies, ordering and review of radiographic studies, pulse oximetry and re-evaluation of patient's  condition.  Medications Ordered in ED Medications  ondansetron  (ZOFRAN ) injection 4 mg (has no administration in time range)  ceFEPIme  (MAXIPIME ) 2 g in sodium chloride  0.9 % 100 mL IVPB (2 g Intravenous New Bag/Given 01/11/24 0646)    And  metroNIDAZOLE  (FLAGYL ) IVPB 500 mg (has no administration in time range)  morphine  (PF) 4 MG/ML injection 4 mg (4 mg Intravenous Given 01/11/24 0507)  ondansetron  (ZOFRAN ) injection 4 mg (4 mg Intravenous Given 01/11/24 0514)  sodium chloride  0.9 % bolus 1,000 mL (0 mLs Intravenous Stopped 01/11/24 0646)  HYDROmorphone  (DILAUDID ) injection 0.5 mg (0.5 mg Intravenous Given 01/11/24 0601)  iohexol  (OMNIPAQUE ) 300 MG/ML solution 75 mL (75 mLs Intravenous Contrast Given 01/11/24 0703)  HYDROmorphone  (DILAUDID ) injection 1 mg (1 mg Intravenous Given 01/11/24 8657)    ED Course/ Medical Decision Making/ A&P                                 Medical Decision Making Amount and/or Complexity of Data Reviewed Labs: ordered. Radiology: ordered.  Risk Prescription drug management.   Patient with recent admission for diverticulitis, endometrial biopsy with complication of uterine perforation here for evaluation of worsening abdominal pain, nausea, vomiting, diarrhea.  She has generalized tenderness on examination.  She was treated with multiple rounds of medications for pain control.  CBC with mild leukocytosis.  Transaminases are mildly elevated and lipase is significantly elevated.  She was started on antibiotics for possible biliary obstruction.  Plan to obtain a CT abdomen pelvis to further evaluate.  Patient care transferred pending additional imaging.        Final Clinical Impression(s) / ED Diagnoses Final diagnoses:  None    Rx / DC Orders ED Discharge Orders     None         Kelsey Patricia, MD 01/11/24 0745

## 2024-01-11 NOTE — ED Triage Notes (Signed)
 Pt reports ongoing generalized abdominal pain that has been ongoing since her 5/8 following a D&C/Hysteroscopy. Pt reports worsening unbearable pain that started around midnight. Pain associated with NVD.

## 2024-01-12 ENCOUNTER — Ambulatory Visit: Payer: Self-pay | Admitting: Obstetrics and Gynecology

## 2024-01-12 DIAGNOSIS — K851 Biliary acute pancreatitis without necrosis or infection: Secondary | ICD-10-CM | POA: Diagnosis not present

## 2024-01-12 LAB — CBC
HCT: 35.3 % — ABNORMAL LOW (ref 36.0–46.0)
Hemoglobin: 11.6 g/dL — ABNORMAL LOW (ref 12.0–15.0)
MCH: 30.6 pg (ref 26.0–34.0)
MCHC: 32.9 g/dL (ref 30.0–36.0)
MCV: 93.1 fL (ref 80.0–100.0)
Platelets: 388 10*3/uL (ref 150–400)
RBC: 3.79 MIL/uL — ABNORMAL LOW (ref 3.87–5.11)
RDW: 14.9 % (ref 11.5–15.5)
WBC: 12.5 10*3/uL — ABNORMAL HIGH (ref 4.0–10.5)
nRBC: 0 % (ref 0.0–0.2)

## 2024-01-12 LAB — GLUCOSE, CAPILLARY
Glucose-Capillary: 101 mg/dL — ABNORMAL HIGH (ref 70–99)
Glucose-Capillary: 105 mg/dL — ABNORMAL HIGH (ref 70–99)
Glucose-Capillary: 128 mg/dL — ABNORMAL HIGH (ref 70–99)
Glucose-Capillary: 93 mg/dL (ref 70–99)
Glucose-Capillary: 94 mg/dL (ref 70–99)
Glucose-Capillary: 96 mg/dL (ref 70–99)

## 2024-01-12 LAB — COMPREHENSIVE METABOLIC PANEL WITH GFR
ALT: 55 U/L — ABNORMAL HIGH (ref 0–44)
AST: 43 U/L — ABNORMAL HIGH (ref 15–41)
Albumin: 2.9 g/dL — ABNORMAL LOW (ref 3.5–5.0)
Alkaline Phosphatase: 155 U/L — ABNORMAL HIGH (ref 38–126)
Anion gap: 9 (ref 5–15)
BUN: 10 mg/dL (ref 8–23)
CO2: 22 mmol/L (ref 22–32)
Calcium: 8.2 mg/dL — ABNORMAL LOW (ref 8.9–10.3)
Chloride: 107 mmol/L (ref 98–111)
Creatinine, Ser: 1.01 mg/dL — ABNORMAL HIGH (ref 0.44–1.00)
GFR, Estimated: 58 mL/min — ABNORMAL LOW (ref >60)
Glucose, Bld: 102 mg/dL — ABNORMAL HIGH (ref 70–99)
Potassium: 3.2 mmol/L — ABNORMAL LOW (ref 3.5–5.1)
Sodium: 138 mmol/L (ref 135–145)
Total Bilirubin: 0.7 mg/dL (ref 0.0–1.2)
Total Protein: 6.5 g/dL (ref 6.5–8.1)

## 2024-01-12 LAB — LIPASE, BLOOD: Lipase: 265 U/L — ABNORMAL HIGH (ref 11–51)

## 2024-01-12 MED ORDER — PROCHLORPERAZINE EDISYLATE 10 MG/2ML IJ SOLN
10.0000 mg | Freq: Four times a day (QID) | INTRAMUSCULAR | Status: DC | PRN
Start: 2024-01-12 — End: 2024-01-21
  Administered 2024-01-12 – 2024-01-17 (×6): 10 mg via INTRAVENOUS
  Filled 2024-01-12 (×6): qty 2

## 2024-01-12 MED ORDER — SODIUM CHLORIDE 0.9 % IV SOLN: 8.0000 mg | Freq: Four times a day (QID) | INTRAVENOUS | Status: DC | PRN

## 2024-01-12 MED ORDER — ONDANSETRON HCL 4 MG PO TABS
8.0000 mg | ORAL_TABLET | Freq: Four times a day (QID) | ORAL | Status: DC | PRN
  Administered 2024-01-17: 8 mg via ORAL
  Filled 2024-01-12: qty 2

## 2024-01-12 MED ORDER — POTASSIUM CHLORIDE 10 MEQ/100ML IV SOLN
10.0000 meq | INTRAVENOUS | Status: AC
  Administered 2024-01-12 (×4): 10 meq via INTRAVENOUS
  Filled 2024-01-12 (×2): qty 100

## 2024-01-12 NOTE — Progress Notes (Signed)
 Subjective/Chief Complaint: Patient still with significant epigastric abdominal pain with nausea and vomiting.   Objective: Vital signs in last 24 hours: Temp:  [97.9 F (36.6 C)-98.9 F (37.2 C)] 98.9 F (37.2 C) (05/14 0410) Pulse Rate:  [83-98] 93 (05/14 0410) Resp:  [16-20] 18 (05/14 0410) BP: (149-203)/(72-87) 176/73 (05/14 0410) SpO2:  [96 %-100 %] 100 % (05/14 0410) Weight:  [71 kg] 71 kg (05/13 1042) Last BM Date : 01/10/24  Intake/Output from previous day: 05/13 0701 - 05/14 0700 In: 2506.6 [I.V.:2206.6; IV Piggyback:300] Out: -  Intake/Output this shift: No intake/output data recorded.  Abdomen: Tender epigastrium.  Lab Results:  Recent Labs    01/11/24 0356 01/12/24 0642  WBC 12.1* 12.5*  HGB 13.2 11.6*  HCT 40.3 35.3*  PLT 453* 388   BMET Recent Labs    01/11/24 0356 01/12/24 0642  NA 135 138  K 3.5 3.2*  CL 99 107  CO2 24 22  GLUCOSE 225* 102*  BUN 13 10  CREATININE 1.20* 1.01*  CALCIUM 9.9 8.2*   PT/INR No results for input(s): "LABPROT", "INR" in the last 72 hours. ABG No results for input(s): "PHART", "HCO3" in the last 72 hours.  Invalid input(s): "PCO2", "PO2"  Studies/Results: US  ABDOMEN LIMITED RUQ (LIVER/GB) Result Date: 01/11/2024 CLINICAL DATA:  6216 Acute pancreatitis 6216 EXAM: ULTRASOUND ABDOMEN LIMITED RIGHT UPPER QUADRANT COMPARISON:  CT scan abdomen and pelvis from earlier the same day FINDINGS: The technologist noted limited exam due to body habitus, bowel gas and other patient related factors. Gallbladder: The gallbladder is physiologically distended. There is mild to moderate diffuse circumferential wall thickening. No gallstones or sludge seen. No pericholecystic free fluid. The technologist noted positive sonographic Murphy's sign. Common bile duct: Diameter: Up to 2.5 mm.  No intrahepatic bile duct dilation. Liver: There is poor sound beam penetration to the deep / posterior aspects of the liver as a result of  increased hepatic echogenicity which reduces the sensitivity of ultrasound for the detection of focal masses. That being said, no focal mass is identified. Portal vein is patent on color Doppler imaging with normal direction of blood flow towards the liver. Other: None. IMPRESSION: 1. There is mild-to-moderate diffuse circumferential wall thickening of the gallbladder. No gallstones or sludge seen. No pericholecystic free fluid. The technologist noted positive sonographic Murphy's sign. Findings are equivocal for acute cholecystitis. If clinical suspicion is high, further evaluation with nuclear medicine HIDA scan is recommended. 2. Increased hepatic echogenicity, a nonspecific finding that is most commonly seen on the basis of steatosis in the absence of known liver disease. 3. Otherwise unremarkable exam. Electronically Signed   By: Beula Brunswick M.D.   On: 01/11/2024 09:57   CT ABDOMEN PELVIS W CONTRAST Result Date: 01/11/2024 CLINICAL DATA:  Abdominal pain acute nonlocalized EXAM: CT ABDOMEN AND PELVIS WITH CONTRAST TECHNIQUE: Multidetector CT imaging of the abdomen and pelvis was performed using the standard protocol following bolus administration of intravenous contrast. RADIATION DOSE REDUCTION: This exam was performed according to the departmental dose-optimization program which includes automated exposure control, adjustment of the mA and/or kV according to patient size and/or use of iterative reconstruction technique. CONTRAST:  75mL OMNIPAQUE  IOHEXOL  300 MG/ML  SOLN COMPARISON:  Jan 02, 2024 FINDINGS: Lower chest: Chronic basilar hypoventilatory atelectatic changes with mild bronchial dilatation could correlate with chronic bronchitis. Hepatobiliary: Liver normal size shape position without parenchymal lesions or biliary dilatation gallbladder appears mildly distended with gallstones in the neck of the gallbladder and mild pericholecystic fluid  which could correlate with a acute cholecystitis.  Inflammatory changes extend from the neck of the gallbladder inferiorly and surrounds the second portion of the duodenal. Small amount of fluid along the anterior lateral paracolic fossa stents inferiorly into the lower abdomen. The pancreas appears also slightly enlarged prominent along the head of the pancreas with a small amount of fluid surrounding the inferior margin of the body of the pancreas findings that correlate also with pancreatitis. Gallbladder ultrasound as well as biliary study recommended for evaluation of gallbladder exclude acute cholecystitis. No obvious choledocholithiasis noted on these images. No drainable peripancreatic fluid collections no pseudocyst formation Spleen: Normal in size without focal abnormality. Adrenals/Urinary Tract: Adrenal glands are unremarkable. Kidneys are normal, without renal calculi, focal lesion, or hydronephrosis. Bladder is unremarkable. Stomach/Bowel: Stomach is within normal limits. Appendix appears normal. No evidence of bowel wall thickening, distention, or inflammatory changes. Diffuse diverticulosis of the descending colon without diverticulitis Vascular/Lymphatic: Aortic atherosclerosis. No enlarged abdominal or pelvic lymph nodes. Reproductive: The uterus demonstrates a hypodense mass in the central portion of the uterus a 3.2 by 3.3 by 4.1 cm which could correlate with a intramural fibroid. Other: No abdominal wall hernia or abnormality. No abdominopelvic ascites. Musculoskeletal: No fracture is seen.  Prior right hip arthroplasty IMPRESSION: *Findings consistent with acute cholecystitis and pancreatitis. Gallbladder ultrasound as well as biliary study recommended for evaluation of gallbladder exclude acute cholecystitis. *No obvious choledocholithiasis noted on these images. *No drainable peripancreatic fluid collections or pseudocyst formation. *Diverticulosis of the descending colon without diverticulitis. *Uterine fibroid. *Chronic basilar  hypoventilatory atelectatic changes with mild bronchial dilatation could correlate with chronic bronchitis. *Aortic atherosclerosis. Electronically Signed   By: Fredrich Jefferson M.D.   On: 01/11/2024 07:38    Anti-infectives: Anti-infectives (From admission, onward)    Start     Dose/Rate Route Frequency Ordered Stop   01/11/24 2200  metroNIDAZOLE  (FLAGYL ) IVPB 500 mg       Placed in "And" Linked Group   500 mg 100 mL/hr over 60 Minutes Intravenous Every 12 hours 01/11/24 0943     01/11/24 1300  doxycycline  (VIBRA -TABS) tablet 100 mg        100 mg Oral Every 12 hours 01/11/24 0937     01/11/24 1200  cefTRIAXone  (ROCEPHIN ) 2 g in sodium chloride  0.9 % 100 mL IVPB        2 g 200 mL/hr over 30 Minutes Intravenous Daily 01/11/24 1058     01/11/24 0945  cefTRIAXone  (ROCEPHIN ) 2 g in sodium chloride  0.9 % 100 mL IVPB  Status:  Discontinued        2 g 200 mL/hr over 30 Minutes Intravenous  Once 01/11/24 0943 01/11/24 1057   01/11/24 0945  ceFEPIme  (MAXIPIME ) 2 g in sodium chloride  0.9 % 100 mL IVPB  Status:  Discontinued       Placed in "And" Linked Group   2 g 200 mL/hr over 30 Minutes Intravenous Every 24 hours 01/11/24 0943 01/11/24 0944   01/11/24 0630  ceFEPIme  (MAXIPIME ) 2 g in sodium chloride  0.9 % 100 mL IVPB       Placed in "And" Linked Group   2 g 200 mL/hr over 30 Minutes Intravenous  Once 01/11/24 0622 01/11/24 0822   01/11/24 0630  metroNIDAZOLE  (FLAGYL ) IVPB 500 mg       Placed in "And" Linked Group   500 mg 100 mL/hr over 60 Minutes Intravenous  Once 01/11/24 0622 01/11/24 1017       Assessment/Plan: Gallstone pancreatitis-patient  has severe pancreatitis and still has pain with nausea and vomiting.  Once her symptoms improve, consider cholecystectomy.     LOS: 1 day    Rodrigo Clara MD  01/12/2024 Moderate complexity

## 2024-01-12 NOTE — Progress Notes (Signed)
 PROGRESS NOTE    Alexandria Durham  ZOX:[id number]  DOB: 09-21-1945  DOA: 01/11/2024 PCP: Bayard Limbo, NP Outpatient Specialists:   Hospital course:  78 year old F with DM2, HTN, CKD stage III was discharged last week after treatment for uterine perforation s/p D&C was admitted for acute gallstone pancreatitis with lipase 6700 and likely acute cholecystitis.   Subjective:  Patient states that she is doing better today compared to yesterday.  Finds antinausea medication very helpful.  Notes she does not really have abdominal pain unless she is nauseated.   Objective: Vitals:   01/11/24 1430 01/11/24 2251 01/12/24 0410 01/12/24 1144  BP: (!) 167/72 (!) 174/84 (!) 176/73 (!) 153/68  Pulse: 83 98 93 93  Resp: 16 19 18    Temp: 98.4 F (36.9 C) 98.4 F (36.9 C) 98.9 F (37.2 C) 99.3 F (37.4 C)  TempSrc: Oral  Oral Oral  SpO2: 100% 97% 100% 92%  Weight:      Height:        Intake/Output Summary (Last 24 hours) at 01/12/2024 1525 Last data filed at 01/12/2024 1500 Gross per 24 hour  Intake 2098.42 ml  Output --  Net 2098.42 ml   Filed Weights   01/11/24 1042  Weight: 71 kg     Exam:  General: Patient lying in bed appearing comfortable but holding her abdomen still Eyes: sclera anicteric, conjuctiva mild injection bilaterally CVS: S1-S2, regular  Respiratory:  decreased air entry bilaterally secondary to decreased inspiratory effort, rales at bases  GI: Diminished bowel sounds but present, normal pitch, abdomen is firm but not hard, she has minimal tenderness to light palpation in epigastric area. LE: Warm and well-perfused Neuro: A/O x 3,  grossly nonfocal.  Psych: patient is logical and coherent, judgement and insight appear normal, mood and affect appropriate to situation.  Data Reviewed:  Basic Metabolic Panel: Recent Labs  Lab 01/06/24 0505 01/07/24 0431 01/08/24 0544 01/11/24 0356 01/12/24 0642  NA 138 135 137 135 138  K 3.5 3.4* 3.7 3.5 3.2*   CL 106 104 104 99 107  CO2 26 23 25 24 22   GLUCOSE 96 103* 88 225* 102*  BUN 8 6* 6* 13 10  CREATININE 1.19* 1.06* 1.14* 1.20* 1.01*  CALCIUM 9.0 8.5* 9.0 9.9 8.2*    CBC: Recent Labs  Lab 01/06/24 0505 01/07/24 0431 01/08/24 0544 01/11/24 0356 01/12/24 0642  WBC 5.1 5.7 5.2 12.1* 12.5*  NEUTROABS 2.7 3.6 2.8  --   --   HGB 11.0* 10.8* 10.9* 13.2 11.6*  HCT 33.6* 33.9* 33.0* 40.3 35.3*  MCV 93.1 93.6 92.4 92.4 93.1  PLT 240 253 271 453* 388     Scheduled Meds:  buPROPion   150 mg Oral QPM   doxycycline   100 mg Oral Q12H   enoxaparin  (LOVENOX ) injection  40 mg Subcutaneous Q24H   hydrochlorothiazide   25 mg Oral Daily   insulin  aspart  0-15 Units Subcutaneous Q4H   levothyroxine   75 mcg Oral Q0600   Continuous Infusions:  cefTRIAXone  (ROCEPHIN )  IV 2 g (01/12/24 1108)   metronidazole  500 mg (01/12/24 0958)   ondansetron  (ZOFRAN ) IV       Assessment & Plan:   Acute pancreatitis--possibly secondary to gallstones Acute cholecystitis CT reveals stones in the neck of the gallbladder with acute cholecystitis Admission lipase 6700 She is being treated conservatively with aggressive fluid resuscitation, nausea and pain management.  Diet has been advanced to clear liquids today per GI. Continue empiric ceftriaxone  and Flagyl  day #2  General surgery will consider acute cholecystectomy after pancreatitis has improved  Hypokalemia Will replete and recheck  Suspected endometritis S/p D&C with uterine perforation last week Continue doxycycline  to complete 10-day course per GYN prior to admission  DM2 Under good control on present regimen Anticipate increase in blood glucose when she starts to eat  HTN Continue home doses of hydrochlorothiazide     DVT prophylaxis: Lovenox  Code Status: Full code Family Communication: None today     Studies: US  ABDOMEN LIMITED RUQ (LIVER/GB) Result Date: 01/11/2024 CLINICAL DATA:  6216 Acute pancreatitis 6216 EXAM: ULTRASOUND  ABDOMEN LIMITED RIGHT UPPER QUADRANT COMPARISON:  CT scan abdomen and pelvis from earlier the same day FINDINGS: The technologist noted limited exam due to body habitus, bowel gas and other patient related factors. Gallbladder: The gallbladder is physiologically distended. There is mild to moderate diffuse circumferential wall thickening. No gallstones or sludge seen. No pericholecystic free fluid. The technologist noted positive sonographic Murphy's sign. Common bile duct: Diameter: Up to 2.5 mm.  No intrahepatic bile duct dilation. Liver: There is poor sound beam penetration to the deep / posterior aspects of the liver as a result of increased hepatic echogenicity which reduces the sensitivity of ultrasound for the detection of focal masses. That being said, no focal mass is identified. Portal vein is patent on color Doppler imaging with normal direction of blood flow towards the liver. Other: None. IMPRESSION: 1. There is mild-to-moderate diffuse circumferential wall thickening of the gallbladder. No gallstones or sludge seen. No pericholecystic free fluid. The technologist noted positive sonographic Murphy's sign. Findings are equivocal for acute cholecystitis. If clinical suspicion is high, further evaluation with nuclear medicine HIDA scan is recommended. 2. Increased hepatic echogenicity, a nonspecific finding that is most commonly seen on the basis of steatosis in the absence of known liver disease. 3. Otherwise unremarkable exam. Electronically Signed   By: Beula Brunswick M.D.   On: 01/11/2024 09:57   CT ABDOMEN PELVIS W CONTRAST Result Date: 01/11/2024 CLINICAL DATA:  Abdominal pain acute nonlocalized EXAM: CT ABDOMEN AND PELVIS WITH CONTRAST TECHNIQUE: Multidetector CT imaging of the abdomen and pelvis was performed using the standard protocol following bolus administration of intravenous contrast. RADIATION DOSE REDUCTION: This exam was performed according to the departmental dose-optimization program  which includes automated exposure control, adjustment of the mA and/or kV according to patient size and/or use of iterative reconstruction technique. CONTRAST:  75mL OMNIPAQUE  IOHEXOL  300 MG/ML  SOLN COMPARISON:  Jan 02, 2024 FINDINGS: Lower chest: Chronic basilar hypoventilatory atelectatic changes with mild bronchial dilatation could correlate with chronic bronchitis. Hepatobiliary: Liver normal size shape position without parenchymal lesions or biliary dilatation gallbladder appears mildly distended with gallstones in the neck of the gallbladder and mild pericholecystic fluid which could correlate with a acute cholecystitis. Inflammatory changes extend from the neck of the gallbladder inferiorly and surrounds the second portion of the duodenal. Small amount of fluid along the anterior lateral paracolic fossa stents inferiorly into the lower abdomen. The pancreas appears also slightly enlarged prominent along the head of the pancreas with a small amount of fluid surrounding the inferior margin of the body of the pancreas findings that correlate also with pancreatitis. Gallbladder ultrasound as well as biliary study recommended for evaluation of gallbladder exclude acute cholecystitis. No obvious choledocholithiasis noted on these images. No drainable peripancreatic fluid collections no pseudocyst formation Spleen: Normal in size without focal abnormality. Adrenals/Urinary Tract: Adrenal glands are unremarkable. Kidneys are normal, without renal calculi, focal lesion, or hydronephrosis. Bladder is  unremarkable. Stomach/Bowel: Stomach is within normal limits. Appendix appears normal. No evidence of bowel wall thickening, distention, or inflammatory changes. Diffuse diverticulosis of the descending colon without diverticulitis Vascular/Lymphatic: Aortic atherosclerosis. No enlarged abdominal or pelvic lymph nodes. Reproductive: The uterus demonstrates a hypodense mass in the central portion of the uterus a 3.2 by 3.3  by 4.1 cm which could correlate with a intramural fibroid. Other: No abdominal wall hernia or abnormality. No abdominopelvic ascites. Musculoskeletal: No fracture is seen.  Prior right hip arthroplasty IMPRESSION: *Findings consistent with acute cholecystitis and pancreatitis. Gallbladder ultrasound as well as biliary study recommended for evaluation of gallbladder exclude acute cholecystitis. *No obvious choledocholithiasis noted on these images. *No drainable peripancreatic fluid collections or pseudocyst formation. *Diverticulosis of the descending colon without diverticulitis. *Uterine fibroid. *Chronic basilar hypoventilatory atelectatic changes with mild bronchial dilatation could correlate with chronic bronchitis. *Aortic atherosclerosis. Electronically Signed   By: Fredrich Jefferson M.D.   On: 01/11/2024 07:38    Principal Problem:   Acute pancreatitis     [employer]ene [employer], [facility]  If 7PM-7AM, please contact night-coverage [email or web address]   LOS: 1 day

## 2024-01-12 NOTE — Telephone Encounter (Signed)
 Left message on voicemail about scheduling new patient visit.  Need to schedule for 02/04/24

## 2024-01-12 NOTE — Plan of Care (Signed)

## 2024-01-12 NOTE — Progress Notes (Signed)
 [facility] Gastroenterology Progress Note  Alexandria Durham 78 y.o. Apr 30, 1946  CC: Gallstone pancreatitis   Subjective: Patient seen and examined at bedside.  Feeling better today.  Abdominal pain has improved but not resolved.  No bowel movement since admission.  ROS : Afebrile, negative for chest pain   Objective: Vital signs in last 24 hours: Vitals:   01/11/24 2251 01/12/24 0410  BP: (!) 174/84 (!) 176/73  Pulse: 98 93  Resp: 19 18  Temp: 98.4 F (36.9 C) 98.9 F (37.2 C)  SpO2: 97% 100%    Physical Exam:  General:   Alert,  Well-developed, well-nourished, pleasant and cooperative in NAD NS, atraumatic, extraocular movement intact Lungs: No visible respiratory distress Heart:  Regular rate and rhythm; no murmurs, clicks, rubs,  or gallops. Abdomen: Epigastric tenderness to palpation with guarding, generalized abdominal discomfort on palpation, bowel sounds present, no rebound, no peritoneal signs Somewhat anxious Alert and oriented x 3   Lab Results: Recent Labs    01/11/24 0356 01/12/24 0642  NA 135 138  K 3.5 3.2*  CL 99 107  CO2 24 22  GLUCOSE 225* 102*  BUN 13 10  CREATININE 1.20* 1.01*  CALCIUM 9.9 8.2*   Recent Labs    01/11/24 0356 01/12/24 0642  AST 297* 43*  ALT 107* 55*  ALKPHOS 245* 155*  BILITOT 1.2 0.7  PROT 8.0 6.5  ALBUMIN 3.6 2.9*   Recent Labs    01/11/24 0356 01/12/24 0642  WBC 12.1* 12.5*  HGB 13.2 11.6*  HCT 40.3 35.3*  MCV 92.4 93.1  PLT 453* 388   No results for input(s): "LABPROT", "INR" in the last 72 hours.    Assessment/Plan: -Acute pancreatitis -likely biliary pancreatitis. ?  Medication induced.  Normal triglyceride in August 2024.  No alcohol use. - Possible acute cholecystitis - Recent history of uterine perforation during D&C - History of diverticulitis earlier this month   Recommendations ------------------------- - Okay to start clear liquid diet and advance to full liquid diet if tolerating clears. -  Continue IV hydration - Timing for cholecystectomy per surgical team - GI will follow   Felecia Hopper MD, FACP 01/12/2024, 9:43 AM  Contact #  [phone]

## 2024-01-13 DIAGNOSIS — K851 Biliary acute pancreatitis without necrosis or infection: Secondary | ICD-10-CM | POA: Diagnosis not present

## 2024-01-13 LAB — CBC
HCT: 34.1 % — ABNORMAL LOW (ref 36.0–46.0)
Hemoglobin: 11.2 g/dL — ABNORMAL LOW (ref 12.0–15.0)
MCH: 30.1 pg (ref 26.0–34.0)
MCHC: 32.8 g/dL (ref 30.0–36.0)
MCV: 91.7 fL (ref 80.0–100.0)
Platelets: 392 10*3/uL (ref 150–400)
RBC: 3.72 MIL/uL — ABNORMAL LOW (ref 3.87–5.11)
RDW: 15.1 % (ref 11.5–15.5)
WBC: 11.5 10*3/uL — ABNORMAL HIGH (ref 4.0–10.5)
nRBC: 0 % (ref 0.0–0.2)

## 2024-01-13 LAB — COMPREHENSIVE METABOLIC PANEL WITH GFR
ALT: 35 U/L (ref 0–44)
AST: 26 U/L (ref 15–41)
Albumin: 2.6 g/dL — ABNORMAL LOW (ref 3.5–5.0)
Alkaline Phosphatase: 112 U/L (ref 38–126)
Anion gap: 7 (ref 5–15)
BUN: 12 mg/dL (ref 8–23)
CO2: 24 mmol/L (ref 22–32)
Calcium: 8.3 mg/dL — ABNORMAL LOW (ref 8.9–10.3)
Chloride: 105 mmol/L (ref 98–111)
Creatinine, Ser: 1.06 mg/dL — ABNORMAL HIGH (ref 0.44–1.00)
GFR, Estimated: 54 mL/min — ABNORMAL LOW (ref >60)
Glucose, Bld: 90 mg/dL (ref 70–99)
Potassium: 3.8 mmol/L (ref 3.5–5.1)
Sodium: 136 mmol/L (ref 135–145)
Total Bilirubin: 0.9 mg/dL (ref 0.0–1.2)
Total Protein: 6 g/dL — ABNORMAL LOW (ref 6.5–8.1)

## 2024-01-13 LAB — GLUCOSE, CAPILLARY
Glucose-Capillary: 102 mg/dL — ABNORMAL HIGH (ref 70–99)
Glucose-Capillary: 99 mg/dL (ref 70–99)
Glucose-Capillary: 112 mg/dL — ABNORMAL HIGH (ref 70–99)
Glucose-Capillary: 110 mg/dL — ABNORMAL HIGH (ref 70–99)
Glucose-Capillary: 101 mg/dL — ABNORMAL HIGH (ref 70–99)
Glucose-Capillary: 97 mg/dL (ref 70–99)

## 2024-01-13 LAB — LIPID PANEL
Cholesterol: 112 mg/dL (ref 0–200)
HDL: 47 mg/dL (ref >40)
LDL Cholesterol: 52 mg/dL (ref 0–99)
Total CHOL/HDL Ratio: 2.4 ratio
Triglycerides: 64 mg/dL (ref <150)
VLDL: 13 mg/dL (ref 0–40)

## 2024-01-13 LAB — LIPASE, BLOOD: Lipase: 68 U/L — ABNORMAL HIGH (ref 11–51)

## 2024-01-13 MED ORDER — PANCRELIPASE (LIP-PROT-AMYL) 36000-114000 UNITS PO CPEP
72000.0000 [IU] | ORAL_CAPSULE | Freq: Three times a day (TID) | ORAL | Status: DC
  Administered 2024-01-13 – 2024-01-21 (×19): 72000 [IU] via ORAL
  Filled 2024-01-13 (×19): qty 2

## 2024-01-13 MED ORDER — POLYETHYLENE GLYCOL 3350 17 G PO PACK
17.0000 g | PACK | Freq: Every day | ORAL | Status: DC
  Administered 2024-01-14 – 2024-01-21 (×7): 17 g via ORAL
  Filled 2024-01-13 (×7): qty 1

## 2024-01-13 NOTE — Progress Notes (Signed)
 [facility] Gastroenterology Progress Note  Alexandria Durham 78 y.o. 1946-03-25  CC: Gallstone pancreatitis   Subjective: Patient seen and examined at bedside.  Feeling better today but continues to have epigastric abdominal pain.  Not able to tolerate liquid diet.  No bowel movement since admission.  ROS : Afebrile, negative for chest pain   Objective: Vital signs in last 24 hours: Vitals:   01/12/24 2004 01/13/24 0423  BP: (!) 159/77 (!) 156/86  Pulse: 97 98  Resp: 18 18  Temp: 99 F (37.2 C) 98.8 F (37.1 C)  SpO2: 97% 94%    Physical Exam:  General:   Alert,  Well-developed, well-nourished, pleasant and cooperative in NAD NS, atraumatic, extraocular movement intact Lungs: No visible respiratory distress Heart:  Regular rate and rhythm; no murmurs, clicks, rubs,  or gallops. Abdomen: Epigastric tenderness to palpation with guarding, generalized abdominal discomfort on palpation, bowel sounds present, no rebound, no peritoneal signs Somewhat anxious Alert and oriented x 3   Lab Results: Recent Labs    01/12/24 0642 01/13/24 0435  NA 138 136  K 3.2* 3.8  CL 107 105  CO2 22 24  GLUCOSE 102* 90  BUN 10 12  CREATININE 1.01* 1.06*  CALCIUM 8.2* 8.3*   Recent Labs    01/12/24 0642 01/13/24 0435  AST 43* 26  ALT 55* 35  ALKPHOS 155* 112  BILITOT 0.7 0.9  PROT 6.5 6.0*  ALBUMIN 2.9* 2.6*   Recent Labs    01/12/24 0642 01/13/24 0621  WBC 12.5* 11.5*  HGB 11.6* 11.2*  HCT 35.3* 34.1*  MCV 93.1 91.7  PLT 388 392   No results for input(s): "LABPROT", "INR" in the last 72 hours.    Assessment/Plan: -Acute pancreatitis -likely biliary pancreatitis. ?  Medication induced.  Normal triglyceride in August 2024.  No alcohol use. - Possible acute cholecystitis - Recent history of uterine perforation during D&C - History of diverticulitis earlier this month   Recommendations ------------------------- - Continue liquid diet as tolerated -Add Creon -Add  MiraLAX  - Continue IV hydration - Timing for cholecystectomy per surgical team - GI will follow   Felecia Hopper MD, FACP 01/13/2024, 11:45 AM  Contact #  [phone]

## 2024-01-13 NOTE — Progress Notes (Signed)
 Progress Note     Subjective: Pt still with significant abdominal pain, nausea. Not passing flatus.   Objective: Vital signs in last 24 hours: Temp:  [98.8 F (37.1 C)-99.2 F (37.3 C)] 99.2 F (37.3 C) (05/15 1315) Pulse Rate:  [91-98] 91 (05/15 1315) Resp:  [18] 18 (05/15 1315) BP: (151-159)/(77-86) 151/84 (05/15 1315) SpO2:  [94 %-100 %] 100 % (05/15 1315) Last BM Date : 01/10/24  Intake/Output from previous day: 05/14 0701 - 05/15 0700 In: 644.9 [P.O.:120; IV Piggyback:524.9] Out: 300 [Urine:300] Intake/Output this shift: Total I/O In: 480 [P.O.:480] Out: -   PE: General: pleasant, WD, WN female who is laying in bed and appears uncomfortable HEENT: Sclera are anicteric Heart: regular, rate, and rhythm.  Lungs:  Respiratory effort nonlabored Abd: soft, generalized ttp, mild distention  Psych: A&Ox3 with an appropriate affect.   Lab Results:  Recent Labs    01/12/24 0642 01/13/24 0621  WBC 12.5* 11.5*  HGB 11.6* 11.2*  HCT 35.3* 34.1*  PLT 388 392   BMET Recent Labs    01/12/24 0642 01/13/24 0435  NA 138 136  K 3.2* 3.8  CL 107 105  CO2 22 24  GLUCOSE 102* 90  BUN 10 12  CREATININE 1.01* 1.06*  CALCIUM 8.2* 8.3*   PT/INR No results for input(s): "LABPROT", "INR" in the last 72 hours. CMP     Component Value Date/Time   NA 136 01/13/2024 0435   K 3.8 01/13/2024 0435   CL 105 01/13/2024 0435   CO2 24 01/13/2024 0435   GLUCOSE 90 01/13/2024 0435   BUN 12 01/13/2024 0435   CREATININE 1.06 (H) 01/13/2024 0435   CALCIUM 8.3 (L) 01/13/2024 0435   PROT 6.0 (L) 01/13/2024 0435   ALBUMIN 2.6 (L) 01/13/2024 0435   AST 26 01/13/2024 0435   ALT 35 01/13/2024 0435   ALKPHOS 112 01/13/2024 0435   BILITOT 0.9 01/13/2024 0435   GFRNONAA 54 (L) 01/13/2024 0435   GFRAA 51 (L) 03/08/2016 1618   Lipase     Component Value Date/Time   LIPASE 68 (H) 01/13/2024 0435       Studies/Results: No results found.  Anti-infectives: Anti-infectives  (From admission, onward)    Start     Dose/Rate Route Frequency Ordered Stop   01/11/24 2200  metroNIDAZOLE  (FLAGYL ) IVPB 500 mg       Placed in "And" Linked Group   500 mg 100 mL/hr over 60 Minutes Intravenous Every 12 hours 01/11/24 0943     01/11/24 1300  doxycycline  (VIBRA -TABS) tablet 100 mg        100 mg Oral Every 12 hours 01/11/24 0937     01/11/24 1200  cefTRIAXone  (ROCEPHIN ) 2 g in sodium chloride  0.9 % 100 mL IVPB        2 g 200 mL/hr over 30 Minutes Intravenous Daily 01/11/24 1058     01/11/24 0945  cefTRIAXone  (ROCEPHIN ) 2 g in sodium chloride  0.9 % 100 mL IVPB  Status:  Discontinued        2 g 200 mL/hr over 30 Minutes Intravenous  Once 01/11/24 0943 01/11/24 1057   01/11/24 0945  ceFEPIme  (MAXIPIME ) 2 g in sodium chloride  0.9 % 100 mL IVPB  Status:  Discontinued       Placed in "And" Linked Group   2 g 200 mL/hr over 30 Minutes Intravenous Every 24 hours 01/11/24 0943 01/11/24 0944   01/11/24 0630  ceFEPIme  (MAXIPIME ) 2 g in sodium chloride  0.9 % 100 mL  IVPB       Placed in "And" Linked Group   2 g 200 mL/hr over 30 Minutes Intravenous  Once 01/11/24 0622 01/11/24 0822   01/11/24 0630  metroNIDAZOLE  (FLAGYL ) IVPB 500 mg       Placed in "And" Linked Group   500 mg 100 mL/hr over 60 Minutes Intravenous  Once 01/11/24 0622 01/11/24 1017        Assessment/Plan  Biliary pancreatitis and probable acute cholecystitis  - CT with gallstones at the neck of the gallbladder, mild pericholecystic fluid, possible cholecystitis; enlarged and prominent pancreas with small amount of fluid surrounding the inferior margin of the body of the pancreas; diverticulosis without active diverticulitis.  - RUQ US  with mild to moderate gallbladder wall thickening, no gallstones or sludge noted - lipase 6697 on admit, Alk Phos/AST/ALT all mildly elevated. Tbili is 1.2 - leukocytosis of 11.5, afebrile and HD stable  - in light of stones noted in gallbladder neck would recommend abx for  cholecystitis  - LFTs and lipase improving but patient still with significant pain - not ready for laparoscopic cholecystectomy at this time but we will follow for surgical planning, will re-evaluate early next week - GI following as well    FEN: FLD, IVF per primary team  VTE: LMWH ID: rocephin /flagyl    - per TRH -  HTN HLD T2DM Recent uterine perforation s/p repair 5/8 by Dr. Vallarie Gauze    LOS: 2 days   I reviewed Consultant GI notes, hospitalist notes, last 24 h vitals and pain scores, last 48 h intake and output, last 24 h labs and trends, and last 24 h imaging results.  This care required moderate level of medical decision making.    Annetta Killian, [facility] 01/13/2024, 4:09 PM Please see Amion for pager number during day hours 7:00am-4:30pm

## 2024-01-13 NOTE — TOC Initial Note (Signed)
 Transition of Care [facility]) - Initial/Assessment Note    Patient Details  Name: Alexandria Durham MRN: [id number] Date of Birth: [birth year]  Transition of Care [facility]) CM/SW Contact:    Kathryn Parish, RN Phone Number: 01/13/2024, 4:17 PM  Clinical Narrative:                  NCM spoke w/ pt in room; pt states she has DME 3in1, rolling walker, cane and HH services with [facility] that was to start on 5/12/ 2025.  pt says she will d/c to her dtr's home [facility] [phone]) [address] [place], [place] [address]; [facility] will follow for progression to discharge. MD order required to continue [facility].  Expected Discharge Plan: Home w Home Health Services Barriers to Discharge: Continued Medical Work up   Patient Goals and CMS Choice Patient states their goals for this hospitalization and ongoing recovery are:: Home with daughter   Choice offered to / list presented to : NA      Expected Discharge Plan and Services   Discharge Planning Services: CM Consult Post Acute Care Choice: NA Living arrangements for the past 2 months: Single Family Home                 DME Arranged: N/A DME Agency: NA                  Prior Living Arrangements/Services Living arrangements for the past 2 months: Single Family Home Lives with:: Self Patient language and need for interpreter reviewed:: Yes Do you feel safe going back to the place where you live?: Yes      Need for Family Participation in Patient Care: Yes (Comment) Care giver support system in place?: Yes (comment) Current home services: DME, Home PT (3in 1, cane, roller walker, [facility]) Criminal Activity/Legal Involvement Pertinent to Current Situation/Hospitalization: No - Comment as needed  Activities of Daily Living   ADL Screening (condition at time of admission) Independently performs ADLs?: Yes (appropriate for developmental age) Is the patient deaf or have difficulty hearing?: No Does the patient have difficulty seeing, even when  wearing glasses/contacts?: No Does the patient have difficulty concentrating, remembering, or making decisions?: No  Permission Sought/Granted Permission sought to share information with : Case Manager Permission granted to share information with : Yes, Verbal Permission Granted  Share Information with NAME: [name]  Permission granted to share info w AGENCY: [facility]  Permission granted to share info w Relationship: daughter  Permission granted to share info w Contact Information: [phone]  Emotional Assessment Appearance:: Appears stated age Attitude/Demeanor/Rapport: Engaged Affect (typically observed): Appropriate Orientation: : Oriented to Self, Oriented to Place, Oriented to  Time, Oriented to Situation Alcohol / Substance Use: Not Applicable Psych Involvement: No (comment)  Admission diagnosis:  Acute pancreatitis [K85.90] Patient Active Problem List   Diagnosis Date Noted   Acute pancreatitis 01/11/2024   Abdominal pain 01/06/2024   Thickened endometrium 01/06/2024   Uterine perforation 01/06/2024   Diverticulitis 01/02/2024   Acute idiopathic gout of left foot 12/23/2023   Avascular necrosis of bone of hip, left (HCC) 12/23/2023   Carpal tunnel syndrome of left wrist 09/20/2019   Bilateral carotid artery stenosis 05/10/2019   PVD (peripheral vascular disease) (HCC) 05/10/2019   Lumbar radiculopathy 07/06/2018   Lumbar spondylosis 07/06/2018   Stage 3a chronic kidney disease (HCC) 06/06/2018   Mixed hyperlipidemia 02/24/2018   Cervical radiculopathy 09/29/2017   Former smoker 05/06/2017   Postoperative hypothyroidism 04/14/2017   H/O total  thyroidectomy 03/16/2017   Cortical age-related cataract of both eyes 11/25/2016   Asymmetric septal hypertrophy 04/30/2016   Gastroesophageal reflux disease without esophagitis 03/02/2016   Cardiac murmur, unspecified 02/24/2016   Benign hypertension with CKD (chronic kidney disease) stage III (HCC) 01/01/2016   Vitamin D   deficiency 12/31/2015   Essential hypertension 11/21/2015   Eczema 08/28/2015   Type 2 diabetes mellitus (HCC) 08/28/2015   Type 2 diabetes mellitus with diabetic polyneuropathy, without long-term current use of insulin  (HCC) 08/28/2015   PCP:  Bayard Limbo, NP Pharmacy:   [facility] [phone] - [place], [place] - [address] [place] [address] Phone: [phone] Fax: [phone]  [id number] - [place], [place] - [address] AT [facility] [address] [place] [address] Phone: [phone] Fax: [phone]  [place] [facility] [address] [place] [address] Phone: [phone] Fax: [phone]     Social Drivers of Health (SDOH) Social History: SDOH Screenings   Food Insecurity: No Food Insecurity (01/11/2024)  Housing: Low Risk  (01/11/2024)  Transportation Needs: No Transportation Needs (01/11/2024)  Utilities: Not At Risk (01/11/2024)  Social Connections: Unknown (01/11/2024)  Tobacco Use: High Risk (01/11/2024)   SDOH Interventions:     Readmission Risk Interventions     No data to display

## 2024-01-13 NOTE — Plan of Care (Signed)

## 2024-01-13 NOTE — Progress Notes (Signed)
 PROGRESS NOTE    DNYA SILVERMAN  JXB:[id number]  DOB: Nov 21, 1945  DOA: 01/11/2024 PCP: Bayard Limbo, NP Outpatient Specialists:   Hospital course:  78 year old F with DM2, HTN, CKD stage III was discharged last week after treatment for uterine perforation s/p D&C was admitted for acute gallstone pancreatitis with lipase 6700 and likely acute cholecystitis.   Subjective:  Patient states she was feeling better until she tried to drink liquid diet and then she had recurrent abdominal pain and nausea.   Objective: Vitals:   01/12/24 1144 01/12/24 2004 01/13/24 0423 01/13/24 1315  BP: (!) 153/68 (!) 159/77 (!) 156/86 (!) 151/84  Pulse: 93 97 98 91  Resp:  18 18 18   Temp: 99.3 F (37.4 C) 99 F (37.2 C) 98.8 F (37.1 C) 99.2 F (37.3 C)  TempSrc: Oral Oral Oral Oral  SpO2: 92% 97% 94% 100%  Weight:      Height:        Intake/Output Summary (Last 24 hours) at 01/13/2024 1732 Last data filed at 01/13/2024 1030 Gross per 24 hour  Intake 480 ml  Output 300 ml  Net 180 ml   Filed Weights   01/11/24 1042  Weight: 71 kg     Exam:  General: Somewhat uncomfortable appearing patient lying in bed with eyes closed Eyes: sclera anicteric, conjuctiva mild injection bilaterally CVS: S1-S2, regular  Respiratory:  decreased air entry bilaterally secondary to decreased inspiratory effort, rales at bases  GI: Diminished bowel sounds but present, normal pitch, tenderness to light palpation in epigastric area, no guarding, no rebound. LE: Warm and well-perfused Neuro: A/O x 3,  grossly nonfocal.  Psych: patient is logical and coherent, judgement and insight appear normal, mood and affect appropriate to situation.  Data Reviewed:  Basic Metabolic Panel: Recent Labs  Lab 01/07/24 0431 01/08/24 0544 01/11/24 0356 01/12/24 0642 01/13/24 0435  NA 135 137 135 138 136  K 3.4* 3.7 3.5 3.2* 3.8  CL 104 104 99 107 105  CO2 23 25 24 22 24   GLUCOSE 103* 88 225* 102* 90  BUN 6*  6* 13 10 12   CREATININE 1.06* 1.14* 1.20* 1.01* 1.06*  CALCIUM 8.5* 9.0 9.9 8.2* 8.3*    CBC: Recent Labs  Lab 01/07/24 0431 01/08/24 0544 01/11/24 0356 01/12/24 0642 01/13/24 0621  WBC 5.7 5.2 12.1* 12.5* 11.5*  NEUTROABS 3.6 2.8  --   --   --   HGB 10.8* 10.9* 13.2 11.6* 11.2*  HCT 33.9* 33.0* 40.3 35.3* 34.1*  MCV 93.6 92.4 92.4 93.1 91.7  PLT 253 271 453* 388 392     Scheduled Meds:  buPROPion   150 mg Oral QPM   doxycycline   100 mg Oral Q12H   enoxaparin  (LOVENOX ) injection  40 mg Subcutaneous Q24H   hydrochlorothiazide   25 mg Oral Daily   insulin  aspart  0-15 Units Subcutaneous Q4H   levothyroxine   75 mcg Oral Q0600   lipase/protease/amylase  72,000 Units Oral TID AC   polyethylene glycol  17 g Oral Daily   Continuous Infusions:  cefTRIAXone  (ROCEPHIN )  IV 2 g (01/13/24 0738)   metronidazole  500 mg (01/13/24 1158)   ondansetron  (ZOFRAN ) IV       Assessment & Plan:   Acute pancreatitis--possibly secondary to gallstones Acute cholecystitis CT reveals stones in the neck of the gallbladder with acute cholecystitis Admission lipase markedly improved down to 68 today but clinically she has worsened abdominal pain after attempts at clear liquid diet today. Will continue conservative management with  aggressive fluid resuscitation, nausea and pain management.  She remains on clear liquid diet if she can tolerate. Continue empiric ceftriaxone  and Flagyl  day #3 General surgery notes she is not a candidate for laparoscopic cholecystectomy today, will reevaluate next week.  Hypokalemia Resolved with repletion  Suspected endometritis S/p D&C with uterine perforation last week Continue doxycycline  to complete 10-day course per GYN prior to admission  DM2 Under good control on present regimen Anticipate increase in blood glucose when she starts to eat  HTN Continue home doses of hydrochlorothiazide --HCTZ can rarely be associated with pancreatitis but her pancreatitis  appears to be secondary to gallstones rather than medication related.    DVT prophylaxis: Lovenox  Code Status: Full code Family Communication: None today     Studies: No results found.   Principal Problem:   Acute pancreatitis     Magdalene School, [facility]  If 7PM-7AM, please contact night-coverage [email or web address]   LOS: 2 days

## 2024-01-14 ENCOUNTER — Telehealth: Payer: Self-pay

## 2024-01-14 DIAGNOSIS — K851 Biliary acute pancreatitis without necrosis or infection: Secondary | ICD-10-CM | POA: Diagnosis not present

## 2024-01-14 LAB — COMPREHENSIVE METABOLIC PANEL WITH GFR
ALT: 28 U/L (ref 0–44)
AST: 19 U/L (ref 15–41)
Albumin: 2.6 g/dL — ABNORMAL LOW (ref 3.5–5.0)
Alkaline Phosphatase: 101 U/L (ref 38–126)
Anion gap: 9 (ref 5–15)
BUN: 14 mg/dL (ref 8–23)
CO2: 25 mmol/L (ref 22–32)
Calcium: 8.2 mg/dL — ABNORMAL LOW (ref 8.9–10.3)
Chloride: 97 mmol/L — ABNORMAL LOW (ref 98–111)
Creatinine, Ser: 1.03 mg/dL — ABNORMAL HIGH (ref 0.44–1.00)
GFR, Estimated: 56 mL/min — ABNORMAL LOW (ref >60)
Glucose, Bld: 84 mg/dL (ref 70–99)
Potassium: 2.7 mmol/L — CL (ref 3.5–5.1)
Sodium: 131 mmol/L — ABNORMAL LOW (ref 135–145)
Total Bilirubin: 0.8 mg/dL (ref 0.0–1.2)
Total Protein: 6.4 g/dL — ABNORMAL LOW (ref 6.5–8.1)

## 2024-01-14 LAB — GLUCOSE, CAPILLARY
Glucose-Capillary: 85 mg/dL (ref 70–99)
Glucose-Capillary: 114 mg/dL — ABNORMAL HIGH (ref 70–99)
Glucose-Capillary: 98 mg/dL (ref 70–99)
Glucose-Capillary: 98 mg/dL (ref 70–99)
Glucose-Capillary: 108 mg/dL — ABNORMAL HIGH (ref 70–99)

## 2024-01-14 LAB — MAGNESIUM: Magnesium: 1.3 mg/dL — ABNORMAL LOW (ref 1.7–2.4)

## 2024-01-14 LAB — LIPASE, BLOOD: Lipase: 61 U/L — ABNORMAL HIGH (ref 11–51)

## 2024-01-14 MED ORDER — POTASSIUM CHLORIDE CRYS ER 20 MEQ PO TBCR
40.0000 meq | EXTENDED_RELEASE_TABLET | Freq: Once | ORAL | Status: AC
  Administered 2024-01-14: 40 meq via ORAL
  Filled 2024-01-14: qty 2

## 2024-01-14 MED ORDER — ALUM & MAG HYDROXIDE-SIMETH 200-200-20 MG/5ML PO SUSP
30.0000 mL | ORAL | Status: DC | PRN
  Administered 2024-01-14: 30 mL via ORAL
  Filled 2024-01-14: qty 30

## 2024-01-14 MED ORDER — POTASSIUM CHLORIDE CRYS ER 20 MEQ PO TBCR
40.0000 meq | EXTENDED_RELEASE_TABLET | ORAL | Status: AC
  Administered 2024-01-14: 40 meq via ORAL

## 2024-01-14 MED ORDER — FAMOTIDINE IN NACL 20-0.9 MG/50ML-% IV SOLN
20.0000 mg | Freq: Once | INTRAVENOUS | Status: AC
  Administered 2024-01-14: 20 mg via INTRAVENOUS
  Filled 2024-01-14: qty 50

## 2024-01-14 MED ORDER — POTASSIUM CHLORIDE CRYS ER 20 MEQ PO TBCR
40.0000 meq | EXTENDED_RELEASE_TABLET | ORAL | Status: DC
  Filled 2024-01-14: qty 2

## 2024-01-14 MED ORDER — POTASSIUM CHLORIDE CRYS ER 20 MEQ PO TBCR
40.0000 meq | EXTENDED_RELEASE_TABLET | Freq: Four times a day (QID) | ORAL | Status: DC
  Administered 2024-01-14: 40 meq via ORAL
  Filled 2024-01-14: qty 2

## 2024-01-14 MED ORDER — POTASSIUM CHLORIDE 10 MEQ/100ML IV SOLN
10.0000 meq | INTRAVENOUS | Status: DC
  Administered 2024-01-14: 10 meq via INTRAVENOUS
  Filled 2024-01-14: qty 100

## 2024-01-14 NOTE — Progress Notes (Signed)
 [facility] Gastroenterology Progress Note  Alexandria Durham 78 y.o. 06-21-1946  CC: Gallstone pancreatitis   Subjective: Patient seen and examined at bedside.  Feeling better today.  Just had a small bowel movement.  ROS : Afebrile, negative for chest pain   Objective: Vital signs in last 24 hours: Vitals:   01/13/24 1943 01/14/24 0338  BP: 135/70 136/70  Pulse: 86 85  Resp: 18 18  Temp: 98.9 F (37.2 C) 99 F (37.2 C)  SpO2: 94% 100%    Physical Exam:  General:   Alert,  Well-developed, well-nourished, pleasant and cooperative in NAD NS, atraumatic, extraocular movement intact Lungs: No visible respiratory distress Heart:  Regular rate and rhythm; no murmurs, clicks, rubs,  or gallops. Abdomen: Epigastric tenderness to palpation with guarding, generalized abdominal discomfort on palpation, bowel sounds present, no rebound, no peritoneal signs Somewhat anxious Alert and oriented x 3   Lab Results: Recent Labs    01/13/24 0435 01/14/24 0402  NA 136 131*  K 3.8 2.7*  CL 105 97*  CO2 24 25  GLUCOSE 90 84  BUN 12 14  CREATININE 1.06* 1.03*  CALCIUM 8.3* 8.2*  MG  --  1.3*   Recent Labs    01/13/24 0435 01/14/24 0402  AST 26 19  ALT 35 28  ALKPHOS 112 101  BILITOT 0.9 0.8  PROT 6.0* 6.4*  ALBUMIN 2.6* 2.6*   Recent Labs    01/12/24 0642 01/13/24 0621  WBC 12.5* 11.5*  HGB 11.6* 11.2*  HCT 35.3* 34.1*  MCV 93.1 91.7  PLT 388 392   No results for input(s): "LABPROT", "INR" in the last 72 hours.    Assessment/Plan: -Acute pancreatitis -likely biliary pancreatitis. ?  Medication induced.  Normal triglyceride in August 2024.  No alcohol use. - Possible acute cholecystitis - Recent history of uterine perforation during D&C - History of diverticulitis earlier this month   Recommendations ------------------------- - Continue current management.  Continue liquid diet for now. - Surgery planning for cholecystectomy tentatively on Sunday. - No inpatient  GI workup planned.  GI will sign off.  Call us  back if needed.   Felecia Hopper MD, FACP 01/14/2024, 10:39 AM  Contact #  [phone]

## 2024-01-14 NOTE — Progress Notes (Signed)
 PROGRESS NOTE    Alexandria Durham  WGN:[id number]  DOB: [birth year]  DOA: 01/11/2024 PCP: Bayard Limbo, NP Outpatient Specialists:   Hospital course:  78 year old F with DM2, HTN, CKD stage III was discharged last week after treatment for uterine perforation s/p D&C was admitted for acute gallstone pancreatitis with lipase 6700 and likely acute cholecystitis.   Subjective:  Patient notes that her arm is burning where the potassium is going in.  She is wondering if she can have oral supplementation instead.  Patient also notes she has continued abdominal pain as before.  Nausea somewhat improved   Objective: Vitals:   01/13/24 1315 01/13/24 1943 01/14/24 0338 01/14/24 1211  BP: (!) 151/84 135/70 136/70 132/77  Pulse: 91 86 85 78  Resp: 18 18 18 17   Temp: 99.2 F (37.3 C) 98.9 F (37.2 C) 99 F (37.2 C) 98.4 F (36.9 C)  TempSrc: Oral     SpO2: 100% 94% 100% 94%  Weight:      Height:        Intake/Output Summary (Last 24 hours) at 01/14/2024 1512 Last data filed at 01/14/2024 1328 Gross per 24 hour  Intake 480 ml  Output --  Net 480 ml   Filed Weights   01/11/24 1042  Weight: 71 kg     Exam:  General: Somewhat uncomfortable appearing patient lying in bed with eyes closed Eyes: sclera anicteric, conjuctiva mild injection bilaterally CVS: S1-S2, regular  Respiratory:  decreased air entry bilaterally secondary to decreased inspiratory effort, rales at bases  GI: Abdomen is minimally distended, soft,  she has mild to moderate tenderness.  With light palpation although there is an element of anticipatory tenderness this patient tenses up before even touch her.  No apparent rebound.  There is some voluntary guarding diffusely. LE: Warm and well-perfused Neuro: A/O x 3,  grossly nonfocal.  Psych: patient is logical and coherent, judgement and insight appear normal, mood and affect appropriate to situation.  Data Reviewed:  Basic Metabolic Panel: Recent Labs   Lab 01/08/24 0544 01/11/24 0356 01/12/24 0642 01/13/24 0435 01/14/24 0402  NA 137 135 138 136 131*  K 3.7 3.5 3.2* 3.8 2.7*  CL 104 99 107 105 97*  CO2 25 24 22 24 25   GLUCOSE 88 225* 102* 90 84  BUN 6* 13 10 12 14   CREATININE 1.14* 1.20* 1.01* 1.06* 1.03*  CALCIUM 9.0 9.9 8.2* 8.3* 8.2*  MG  --   --   --   --  1.3*    CBC: Recent Labs  Lab 01/08/24 0544 01/11/24 0356 01/12/24 0642 01/13/24 0621  WBC 5.2 12.1* 12.5* 11.5*  NEUTROABS 2.8  --   --   --   HGB 10.9* 13.2 11.6* 11.2*  HCT 33.0* 40.3 35.3* 34.1*  MCV 92.4 92.4 93.1 91.7  PLT 271 453* 388 392     Scheduled Meds:  buPROPion   150 mg Oral QPM   doxycycline   100 mg Oral Q12H   enoxaparin  (LOVENOX ) injection  40 mg Subcutaneous Q24H   hydrochlorothiazide   25 mg Oral Daily   insulin  aspart  0-15 Units Subcutaneous Q4H   levothyroxine   75 mcg Oral Q0600   lipase/protease/amylase  72,000 Units Oral TID AC   polyethylene glycol  17 g Oral Daily   potassium chloride   40 mEq Oral Q4H   Continuous Infusions:  cefTRIAXone  (ROCEPHIN )  IV 2 g (01/14/24 1303)   metronidazole  500 mg (01/14/24 1154)   ondansetron  (ZOFRAN ) IV  Assessment & Plan:   Acute pancreatitis--possibly secondary to gallstones Acute cholecystitis CT reveals stones in the neck of the gallbladder with acute cholecystitis Admission lipase markedly decreased but clinically she remains uncomfortable and did not tolerate advancing of diet yesterday. Being followed by GI and general surgery. General surgery notes that they may consider laparoscopic cholecystectomy on Sunday. She remains on clear liquid diet if she can tolerate. Continue empiric ceftriaxone  and Flagyl  day #  Hypokalemia Unable to tolerate IV repletion, will replete orally and recheck tomorrow  Suspected endometritis S/p D&C with uterine perforation last week Continue doxycycline  to complete 10-day course per GYN prior to admission  DM2 Under good control on present  regimen Anticipate increase in blood glucose when she starts to eat  HTN Continue home doses of hydrochlorothiazide --HCTZ can rarely be associated with pancreatitis but her pancreatitis appears to be secondary to gallstones rather than medication related.    DVT prophylaxis: Lovenox  Code Status: Full code Family Communication: None today     Studies: No results found.   Principal Problem:   Acute pancreatitis     Magdalene School, [facility]  If 7PM-7AM, please contact night-coverage [email or web address]   LOS: 3 days

## 2024-01-14 NOTE — Plan of Care (Signed)

## 2024-01-14 NOTE — Telephone Encounter (Signed)
 Left 3rd message for patient to contact us  about scheduling her new patient visit.

## 2024-01-14 NOTE — Progress Notes (Signed)
 Subjective/Chief Complaint: Pt with abdominal pain but better than yesterday Less N/V   Objective: Vital signs in last 24 hours: Temp:  [98.9 F (37.2 C)-99.2 F (37.3 C)] 99 F (37.2 C) (05/16 0338) Pulse Rate:  [85-91] 85 (05/16 0338) Resp:  [18] 18 (05/16 0338) BP: (135-151)/(70-84) 136/70 (05/16 0338) SpO2:  [94 %-100 %] 100 % (05/16 0338) Last BM Date : 01/10/24  Intake/Output from previous day: 05/15 0701 - 05/16 0700 In: 960 [P.O.:960] Out: -  Intake/Output this shift: No intake/output data recorded.  ABD: TENDER EPIGASTRIUM   Lab Results:  Recent Labs    01/12/24 0642 01/13/24 0621  WBC 12.5* 11.5*  HGB 11.6* 11.2*  HCT 35.3* 34.1*  PLT 388 392   BMET Recent Labs    01/13/24 0435 01/14/24 0402  NA 136 131*  K 3.8 2.7*  CL 105 97*  CO2 24 25  GLUCOSE 90 84  BUN 12 14  CREATININE 1.06* 1.03*  CALCIUM 8.3* 8.2*   PT/INR No results for input(s): "LABPROT", "INR" in the last 72 hours. ABG No results for input(s): "PHART", "HCO3" in the last 72 hours.  Invalid input(s): "PCO2", "PO2"  Studies/Results: No results found.  Anti-infectives: Anti-infectives (From admission, onward)    Start     Dose/Rate Route Frequency Ordered Stop   01/11/24 2200  metroNIDAZOLE  (FLAGYL ) IVPB 500 mg       Placed in "And" Linked Group   500 mg 100 mL/hr over 60 Minutes Intravenous Every 12 hours 01/11/24 0943     01/11/24 1300  doxycycline  (VIBRA -TABS) tablet 100 mg        100 mg Oral Every 12 hours 01/11/24 0937     01/11/24 1200  cefTRIAXone  (ROCEPHIN ) 2 g in sodium chloride  0.9 % 100 mL IVPB        2 g 200 mL/hr over 30 Minutes Intravenous Daily 01/11/24 1058     01/11/24 0945  cefTRIAXone  (ROCEPHIN ) 2 g in sodium chloride  0.9 % 100 mL IVPB  Status:  Discontinued        2 g 200 mL/hr over 30 Minutes Intravenous  Once 01/11/24 0943 01/11/24 1057   01/11/24 0945  ceFEPIme  (MAXIPIME ) 2 g in sodium chloride  0.9 % 100 mL IVPB  Status:  Discontinued        Placed in "And" Linked Group   2 g 200 mL/hr over 30 Minutes Intravenous Every 24 hours 01/11/24 0943 01/11/24 0944   01/11/24 0630  ceFEPIme  (MAXIPIME ) 2 g in sodium chloride  0.9 % 100 mL IVPB       Placed in "And" Linked Group   2 g 200 mL/hr over 30 Minutes Intravenous  Once 01/11/24 0622 01/11/24 0822   01/11/24 0630  metroNIDAZOLE  (FLAGYL ) IVPB 500 mg       Placed in "And" Linked Group   500 mg 100 mL/hr over 60 Minutes Intravenous  Once 01/11/24 0622 01/11/24 1017       Assessment/Plan:  Biliary pancreatitis and probable acute cholecystitis  - CT with gallstones at the neck of the gallbladder, mild pericholecystic fluid, possible cholecystitis; enlarged and prominent pancreas with small amount of fluid surrounding the inferior margin of the body of the pancreas; diverticulosis without active diverticulitis.  - RUQ US  with mild to moderate gallbladder wall thickening, no gallstones or sludge noted - lipase 6697 on admit,now 61 - leukocytosis of 11.5, afebrile and HD stable  - in light of stones noted in gallbladder neck would recommend abx for cholecystitis  -  LFTs and lipase improving but patient still with significant pain - not ready for laparoscopic cholecystectomy at this time but we will follow for surgical planning- check on this weekend to see if ready for LGB possibly Sunday  - GI following as well    FEN: FLD, IVF per primary team  VTE: LMWH ID: [email or web address]    - per [facility] -  HTN HLD T2DM Recent uterine perforation s/p repair 5/8 by Dr. Vallarie Gauze     LOS: 3 days    Brandy Cal Allex Madia MD  01/14/2024 Moderate complexity

## 2024-01-15 ENCOUNTER — Inpatient Hospital Stay (HOSPITAL_COMMUNITY)

## 2024-01-15 DIAGNOSIS — K851 Biliary acute pancreatitis without necrosis or infection: Secondary | ICD-10-CM | POA: Diagnosis not present

## 2024-01-15 LAB — CBC WITH DIFFERENTIAL/PLATELET
Abs Immature Granulocytes: 0.06 10*3/uL (ref 0.00–0.07)
Basophils Absolute: 0 10*3/uL (ref 0.0–0.1)
Basophils Relative: 0 %
Eosinophils Absolute: 0.1 10*3/uL (ref 0.0–0.5)
Eosinophils Relative: 1 %
HCT: 32.1 % — ABNORMAL LOW (ref 36.0–46.0)
Hemoglobin: 10.8 g/dL — ABNORMAL LOW (ref 12.0–15.0)
Immature Granulocytes: 1 %
Lymphocytes Relative: 16 %
Lymphs Abs: 1.5 10*3/uL (ref 0.7–4.0)
MCH: 30.3 pg (ref 26.0–34.0)
MCHC: 33.6 g/dL (ref 30.0–36.0)
MCV: 89.9 fL (ref 80.0–100.0)
Monocytes Absolute: 0.8 10*3/uL (ref 0.1–1.0)
Monocytes Relative: 9 %
Neutro Abs: 6.7 10*3/uL (ref 1.7–7.7)
Neutrophils Relative %: 73 %
Platelets: 453 10*3/uL — ABNORMAL HIGH (ref 150–400)
RBC: 3.57 MIL/uL — ABNORMAL LOW (ref 3.87–5.11)
RDW: 14.3 % (ref 11.5–15.5)
WBC: 9.1 10*3/uL (ref 4.0–10.5)
nRBC: 0 % (ref 0.0–0.2)

## 2024-01-15 LAB — COMPREHENSIVE METABOLIC PANEL WITH GFR
ALT: 24 U/L (ref 0–44)
AST: 14 U/L — ABNORMAL LOW (ref 15–41)
Albumin: 2.7 g/dL — ABNORMAL LOW (ref 3.5–5.0)
Alkaline Phosphatase: 90 U/L (ref 38–126)
Anion gap: 10 (ref 5–15)
BUN: 14 mg/dL (ref 8–23)
CO2: 24 mmol/L (ref 22–32)
Calcium: 8.7 mg/dL — ABNORMAL LOW (ref 8.9–10.3)
Chloride: 97 mmol/L — ABNORMAL LOW (ref 98–111)
Creatinine, Ser: 1.01 mg/dL — ABNORMAL HIGH (ref 0.44–1.00)
GFR, Estimated: 58 mL/min — ABNORMAL LOW (ref >60)
Glucose, Bld: 97 mg/dL (ref 70–99)
Potassium: 3.9 mmol/L (ref 3.5–5.1)
Sodium: 131 mmol/L — ABNORMAL LOW (ref 135–145)
Total Bilirubin: 0.9 mg/dL (ref 0.0–1.2)
Total Protein: 6.6 g/dL (ref 6.5–8.1)

## 2024-01-15 LAB — BASIC METABOLIC PANEL WITH GFR
Anion gap: 7 (ref 5–15)
BUN: 14 mg/dL (ref 8–23)
CO2: 25 mmol/L (ref 22–32)
Calcium: 8.7 mg/dL — ABNORMAL LOW (ref 8.9–10.3)
Chloride: 102 mmol/L (ref 98–111)
Creatinine, Ser: 1.02 mg/dL — ABNORMAL HIGH (ref 0.44–1.00)
GFR, Estimated: 57 mL/min — ABNORMAL LOW (ref >60)
Glucose, Bld: 106 mg/dL — ABNORMAL HIGH (ref 70–99)
Potassium: 4 mmol/L (ref 3.5–5.1)
Sodium: 134 mmol/L — ABNORMAL LOW (ref 135–145)

## 2024-01-15 LAB — GLUCOSE, CAPILLARY
Glucose-Capillary: 100 mg/dL — ABNORMAL HIGH (ref 70–99)
Glucose-Capillary: 108 mg/dL — ABNORMAL HIGH (ref 70–99)
Glucose-Capillary: 112 mg/dL — ABNORMAL HIGH (ref 70–99)
Glucose-Capillary: 114 mg/dL — ABNORMAL HIGH (ref 70–99)
Glucose-Capillary: 119 mg/dL — ABNORMAL HIGH (ref 70–99)
Glucose-Capillary: 126 mg/dL — ABNORMAL HIGH (ref 70–99)
Glucose-Capillary: 94 mg/dL (ref 70–99)

## 2024-01-15 LAB — LIPASE, BLOOD: Lipase: 52 U/L — ABNORMAL HIGH (ref 11–51)

## 2024-01-15 MED ORDER — IOHEXOL 300 MG/ML  SOLN
100.0000 mL | Freq: Once | INTRAMUSCULAR | Status: AC | PRN
  Administered 2024-01-15: 100 mL via INTRAVENOUS

## 2024-01-15 MED ORDER — SODIUM CHLORIDE 0.9% FLUSH: 10.0000 mL | INTRAVENOUS | Status: DC | PRN

## 2024-01-15 NOTE — Progress Notes (Signed)
 Subjective/Chief Complaint: Patient complains that central pain is worse than yesterday and is more bloated.     Objective: Vital signs in last 24 hours: Temp:  [98.4 F (36.9 C)-98.9 F (37.2 C)] 98.9 F (37.2 C) (05/17 0430) Pulse Rate:  [78-86] 86 (05/17 0430) Resp:  [17-18] 18 (05/17 0430) BP: (132-137)/(76-77) 132/77 (05/17 0430) SpO2:  [94 %-98 %] 98 % (05/17 0430) Last BM Date : 01/10/24  Intake/Output from previous day: 05/16 0701 - 05/17 0700 In: 960 [P.O.:960] Out: -  Intake/Output this shift: No intake/output data recorded.  ABD: TENDER periumbilical region.  Lab Results:  Recent Labs    01/13/24 0621  WBC 11.5*  HGB 11.2*  HCT 34.1*  PLT 392   BMET Recent Labs    01/14/24 0402 01/15/24 0522  NA 131* 134*  K 2.7* 4.0  CL 97* 102  CO2 25 25  GLUCOSE 84 106*  BUN 14 14  CREATININE 1.03* 1.02*  CALCIUM  8.2* 8.7*   PT/INR No results for input(s): "LABPROT", "INR" in the last 72 hours. ABG No results for input(s): "PHART", "HCO3" in the last 72 hours.  Invalid input(s): "PCO2", "PO2"  Studies/Results: No results found.  Anti-infectives: Anti-infectives (From admission, onward)    Start     Dose/Rate Route Frequency Ordered Stop   01/11/24 2200  metroNIDAZOLE  (FLAGYL ) IVPB 500 mg       Placed in "And" Linked Group   500 mg 100 mL/hr over 60 Minutes Intravenous Every 12 hours 01/11/24 0943     01/11/24 1300  doxycycline  (VIBRA -TABS) tablet 100 mg        100 mg Oral Every 12 hours 01/11/24 0937     01/11/24 1200  cefTRIAXone  (ROCEPHIN ) 2 g in sodium chloride  0.9 % 100 mL IVPB        2 g 200 mL/hr over 30 Minutes Intravenous Daily 01/11/24 1058     01/11/24 0945  cefTRIAXone  (ROCEPHIN ) 2 g in sodium chloride  0.9 % 100 mL IVPB  Status:  Discontinued        2 g 200 mL/hr over 30 Minutes Intravenous  Once 01/11/24 0943 01/11/24 1057   01/11/24 0945  ceFEPIme  (MAXIPIME ) 2 g in sodium chloride  0.9 % 100 mL IVPB  Status:  Discontinued        Placed in "And" Linked Group   2 g 200 mL/hr over 30 Minutes Intravenous Every 24 hours 01/11/24 0943 01/11/24 0944   01/11/24 0630  ceFEPIme  (MAXIPIME ) 2 g in sodium chloride  0.9 % 100 mL IVPB       Placed in "And" Linked Group   2 g 200 mL/hr over 30 Minutes Intravenous  Once 01/11/24 0622 01/11/24 0822   01/11/24 0630  metroNIDAZOLE  (FLAGYL ) IVPB 500 mg       Placed in "And" Linked Group   500 mg 100 mL/hr over 60 Minutes Intravenous  Once 01/11/24 0622 01/11/24 1017       Assessment/Plan:  Biliary pancreatitis and probable acute cholecystitis, CT 5/4 with mild diverticulitis - CT 5/13 with gallstones at the neck of the gallbladder, mild pericholecystic fluid, possible cholecystitis; enlarged and prominent pancreas with small amount of fluid surrounding the inferior margin of the body of the pancreas; diverticulosis without active diverticulitis.  CT 5/4 with mild diverticulitis at junction of descending and sigmoid colon.   - RUQ US  with mild to moderate gallbladder wall thickening, no gallstones or sludge noted - lipase 6697 on admit,now 61 - leukocytosis of 11.5 5/15 - in  light of stones noted in gallbladder neck would recommend abx for cholecystitis  - LFTs and lipase improved but still with sig pain.  - given prior panc fluid and mild diverticulitis as well as now periumbilical pain and bloating will repeat CT to assess for potential for increased peripancreatic fluid collections or worsening diverticulitis.  Will also repeat labs.  Potential for lap chole tomorrow vs Monday depending on results.  Will make npo after midnight pending decision.  - GI following as well    FEN: FLD, IVF per primary team. Npo after midnight for possible lap chole tomorrow.   VTE: LMWH ID: [email or web address]    - per [facility] -  HTN HLD T2DM Recent uterine perforation s/p repair 5/8 by Dr. Vallarie Gauze     LOS: 4 days    Lockie Rima MD  01/15/2024 Moderate complexity

## 2024-01-15 NOTE — Plan of Care (Signed)

## 2024-01-15 NOTE — Progress Notes (Signed)
 PROGRESS NOTE    CARLIN MAMONE  UEA:[id number]  DOB: March 22, 1946  DOA: 01/11/2024 PCP: Bayard Limbo, NP Outpatient Specialists:   Hospital course:  As per prior documentation: "78 year old F with DM2, HTN, CKD stage III was discharged last week after treatment for uterine perforation s/p D&C.  Patient was admitted for acute gallstone pancreatitis with lipase 6700 and likely acute cholecystitis".  01/15/2024: Patient seen.  No new complaints.  Lipase is down to 52.  Input from GI team is highly appreciated.  Repeat CT abdomen and pelvis with contrast done today, 01/15/2024, revealed: 1. Acute pancreatitis, with increased edema and inflammatory changes in the pancreatic head and uncinate process since prior study. No fluid collection, pseudocyst, or abscess. 2. Increasing globular hyperdense material within the gallbladder lumen, differential would include blood clot versus tumefactive sludge. No evidence of acute cholecystitis on this exam. Short interval follow-up ultrasound or CT could be performed. 3. Mild wall thickening of the distal thoracic esophagus at the gastroesophageal junction, new since prior study, which could reflect esophagitis or sequela of reflux/emesis. 4. Diffuse colonic diverticulosis without diverticulitis. 5. Distended fluid-filled endometrial cavity, unchanged since prior CT and ultrasound. Leading diagnostic consideration would be cervical mass or stenosis, and gynecologic follow-up is recommended. 6.  Aortic Atherosclerosis (ICD10-I70.0).     Electronically Signed   By: Bobbye Burrow M.D.   On: 01/15/2024 12:52    Subjective: Patient seen. No new complaints.  Objective: Vitals:   01/14/24 0338 01/14/24 1211 01/14/24 2013 01/15/24 0430  BP: 136/70 132/77 137/76 132/77  Pulse: 85 78 82 86  Resp: 18 17 18 18   Temp: 99 F (37.2 C) 98.4 F (36.9 C) 98.7 F (37.1 C) 98.9 F (37.2 C)  TempSrc:   Oral Oral  SpO2: 100% 94% 96% 98%  Weight:       Height:        Intake/Output Summary (Last 24 hours) at 01/15/2024 1248 Last data filed at 01/14/2024 1800 Gross per 24 hour  Intake 960 ml  Output --  Net 960 ml   Filed Weights   01/11/24 1042  Weight: 71 kg     Exam:  General: Not in any distress.  Awake and alert. Eyes: No pallor.   CVS: S1-S2 Respiratory: Clear to auscultation. GI: Vague abdominal tenderness. Neuro: Awake and alert.  Data Reviewed:  Basic Metabolic Panel: Recent Labs  Lab 01/12/24 0642 01/13/24 0435 01/14/24 0402 01/15/24 0522 01/15/24 0947  NA 138 136 131* 134* 131*  K 3.2* 3.8 2.7* 4.0 3.9  CL 107 105 97* 102 97*  CO2 22 24 25 25 24   GLUCOSE 102* 90 84 106* 97  BUN 10 12 14 14 14   CREATININE 1.01* 1.06* 1.03* 1.02* 1.01*  CALCIUM  8.2* 8.3* 8.2* 8.7* 8.7*  MG  --   --  1.3*  --   --     CBC: Recent Labs  Lab 01/11/24 0356 01/12/24 0642 01/13/24 0621 01/15/24 0947  WBC 12.1* 12.5* 11.5* 9.1  NEUTROABS  --   --   --  6.7  HGB 13.2 11.6* 11.2* 10.8*  HCT 40.3 35.3* 34.1* 32.1*  MCV 92.4 93.1 91.7 89.9  PLT 453* 388 392 453*     Scheduled Meds:  buPROPion   150 mg Oral QPM   doxycycline   100 mg Oral Q12H   enoxaparin  (LOVENOX ) injection  40 mg Subcutaneous Q24H   hydrochlorothiazide   25 mg Oral Daily   insulin  aspart  0-15 Units Subcutaneous Q4H   levothyroxine   75 mcg Oral Q0600   lipase/protease/amylase  72,000 Units Oral TID AC   polyethylene glycol  17 g Oral Daily   Continuous Infusions:  cefTRIAXone  (ROCEPHIN )  IV 2 g (01/15/24 0945)   metronidazole  500 mg (01/15/24 1027)   ondansetron  (ZOFRAN ) IV       Assessment & Plan:   Acute pancreatitis--possibly secondary to gallstones Acute cholecystitis CT reveals stones in the neck of the gallbladder with acute cholecystitis Admission lipase markedly decreased but clinically she remains uncomfortable and did not tolerate advancing of diet yesterday. Being followed by GI and general surgery. General surgery  notes that they may consider laparoscopic cholecystectomy on Sunday. She remains on clear liquid diet if she can tolerate. Continue empiric ceftriaxone  and Flagyl  day # 01/15/2024: Repeat CTs as documented above.  GI input is appreciated.  Possible cholecystectomy tomorrow.  Hypokalemia Resolved. Potassium of 3.9 today.  Suspected endometritis S/p D&C with uterine perforation last week Continue doxycycline  to complete 10-day course per GYN prior to admission  DM2 Continue to optimize.  HTN Continue to optimize.   DVT prophylaxis: Lovenox  Code Status: Full code Family Communication: None today     Studies: No results found.   Principal Problem:   Acute pancreatitis     Doroteo Gasmen, [facility]  If 7PM-7AM, please contact night-coverage [email or web address]   LOS: 4 days

## 2024-01-16 DIAGNOSIS — K851 Biliary acute pancreatitis without necrosis or infection: Secondary | ICD-10-CM | POA: Diagnosis not present

## 2024-01-16 LAB — GLUCOSE, CAPILLARY
Glucose-Capillary: 95 mg/dL (ref 70–99)
Glucose-Capillary: 88 mg/dL (ref 70–99)
Glucose-Capillary: 171 mg/dL — ABNORMAL HIGH (ref 70–99)
Glucose-Capillary: 85 mg/dL (ref 70–99)
Glucose-Capillary: 93 mg/dL (ref 70–99)
Glucose-Capillary: 87 mg/dL (ref 70–99)

## 2024-01-16 MED ORDER — PANTOPRAZOLE SODIUM 40 MG IV SOLR
40.0000 mg | Freq: Every day | INTRAVENOUS | Status: DC
  Administered 2024-01-16 – 2024-01-17 (×2): 40 mg via INTRAVENOUS
  Filled 2024-01-16 (×2): qty 10

## 2024-01-16 NOTE — Progress Notes (Signed)
 PROGRESS NOTE    Alexandria Durham  ZOX:[id number]  DOB: 1946/06/01  DOA: 01/11/2024 PCP: Bayard Limbo, NP Outpatient Specialists:   Hospital course:  As per prior documentation: "78 year old F with DM2, HTN, CKD stage III was discharged last week after treatment for uterine perforation s/p D&C.  Patient was admitted for acute gallstone pancreatitis with lipase 6700 and likely acute cholecystitis".  01/15/2024: Patient seen.  No new complaints.  Lipase is down to 52.  Input from GI team is highly appreciated.   Repeat CT abdomen and pelvis with contrast done today, 01/15/2024, revealed: 1. Acute pancreatitis, with increased edema and inflammatory changes in the pancreatic head and uncinate process since prior study. No fluid collection, pseudocyst, or abscess. 2. Increasing globular hyperdense material within the gallbladder lumen, differential would include blood clot versus tumefactive sludge. No evidence of acute cholecystitis on this exam. Short interval follow-up ultrasound or CT could be performed. 3. Mild wall thickening of the distal thoracic esophagus at the gastroesophageal junction, new since prior study, which could reflect esophagitis or sequela of reflux/emesis. 4. Diffuse colonic diverticulosis without diverticulitis. 5. Distended fluid-filled endometrial cavity, unchanged since prior CT and ultrasound. Leading diagnostic consideration would be cervical mass or stenosis, and gynecologic follow-up is recommended. 6.  Aortic Atherosclerosis (ICD10-I70.0).     Electronically Signed   By: Bobbye Burrow M.D.   On: 01/15/2024 12:52   01/16/2024: Patient seen.  Patient continues to report abdominal pain.  Surgical input is appreciated.  Surgery is on hold due to CT abdomen/pelvis findings.    Subjective: Patient seen. Patient continues to report abdominal pain.  Objective: Vitals:   01/15/24 1952 01/15/24 2132 01/16/24 0348 01/16/24 1158  BP: (!) 143/76 (!)  147/79 (!) 155/72 (!) 164/66  Pulse: 80 90 89 86  Resp: 18 18 18 20   Temp: 98.3 F (36.8 C) 98.4 F (36.9 C) 98.3 F (36.8 C) 98.3 F (36.8 C)  TempSrc: Oral Oral Oral   SpO2: 96% 98% 98% 93%  Weight:      Height:       No intake or output data in the 24 hours ending 01/16/24 1625  Filed Weights   01/11/24 1042  Weight: 71 kg     Exam:  General: Not in any distress.  Awake and alert. Eyes: No pallor.   CVS: S1-S2 Respiratory: Clear to auscultation. GI: Abdominal tenderness. Neuro: Awake and alert.  Data Reviewed:  Basic Metabolic Panel: Recent Labs  Lab 01/12/24 0642 01/13/24 0435 01/14/24 0402 01/15/24 0522 01/15/24 0947  NA 138 136 131* 134* 131*  K 3.2* 3.8 2.7* 4.0 3.9  CL 107 105 97* 102 97*  CO2 22 24 25 25 24   GLUCOSE 102* 90 84 106* 97  BUN 10 12 14 14 14   CREATININE 1.01* 1.06* 1.03* 1.02* 1.01*  CALCIUM  8.2* 8.3* 8.2* 8.7* 8.7*  MG  --   --  1.3*  --   --     CBC: Recent Labs  Lab 01/11/24 0356 01/12/24 0642 01/13/24 0621 01/15/24 0947  WBC 12.1* 12.5* 11.5* 9.1  NEUTROABS  --   --   --  6.7  HGB 13.2 11.6* 11.2* 10.8*  HCT 40.3 35.3* 34.1* 32.1*  MCV 92.4 93.1 91.7 89.9  PLT 453* 388 392 453*     Scheduled Meds:  buPROPion   150 mg Oral QPM   doxycycline   100 mg Oral Q12H   enoxaparin  (LOVENOX ) injection  40 mg Subcutaneous Q24H   hydrochlorothiazide   25  mg Oral Daily   insulin  aspart  0-15 Units Subcutaneous Q4H   levothyroxine   75 mcg Oral Q0600   lipase/protease/amylase  72,000 Units Oral TID AC   pantoprazole  (PROTONIX ) IV  40 mg Intravenous QHS   polyethylene glycol  17 g Oral Daily   Continuous Infusions:  cefTRIAXone  (ROCEPHIN )  IV 2 g (01/16/24 0859)   metronidazole  500 mg (01/16/24 0949)   ondansetron  (ZOFRAN ) IV       Assessment & Plan:   Acute pancreatitis--possibly secondary to gallstones Acute cholecystitis CT reveals stones in the neck of the gallbladder with acute cholecystitis Admission lipase markedly  decreased but clinically she remains uncomfortable and did not tolerate advancing of diet yesterday. Being followed by GI and general surgery. General surgery notes that they may consider laparoscopic cholecystectomy on Sunday. She remains on clear liquid diet if she can tolerate. Continue empiric ceftriaxone  and Flagyl  day # 01/15/2024: Repeat CTs as documented above.  GI input is appreciated.  Possible cholecystectomy tomorrow.  Hypokalemia Resolved. Potassium of 3.9 today.  Suspected endometritis S/p D&C with uterine perforation last week Continue doxycycline  to complete 10-day course per GYN prior to admission  DM2 Continue to optimize.  HTN Continue to optimize.   DVT prophylaxis: Lovenox  Code Status: Full code Family Communication: None today     Studies: CT ABDOMEN PELVIS W CONTRAST Result Date: 01/15/2024 CLINICAL DATA:  Pancreatitis, acute, severe pt with cholecystitis/pancreatitis and prior diverticulitis. worsening periumbilical pain EXAM: CT ABDOMEN AND PELVIS WITH CONTRAST TECHNIQUE: Multidetector CT imaging of the abdomen and pelvis was performed using the standard protocol following bolus administration of intravenous contrast. RADIATION DOSE REDUCTION: This exam was performed according to the departmental dose-optimization program which includes automated exposure control, adjustment of the mA and/or kV according to patient size and/or use of iterative reconstruction technique. CONTRAST:  OMNIPAQUE  IOHEXOL  300 MG/ML  SOLN COMPARISON:  01/11/2024 CT, 01/02/2024 CT, 12/07/2023 ultrasound FINDINGS: Lower chest: Bibasilar subsegmental atelectasis. No acute pleural or parenchymal lung disease. Hepatobiliary: Increased globular high attenuation material within the gallbladder lumen, which could reflect blood products given significant increased since the 01/11/2024 exam and new appearance since 01/02/2024. This could reflect blood clot or tumefactive sludge. The  pericholecystic fat stranding noted previously has improved in the interim. No biliary duct dilation. Liver is unremarkable. Pancreas: Progressive inflammatory changes are seen within the head and uncinate process of the pancreas consistent with acute pancreatitis. There is no evidence of fluid collection, pseudocyst, or abscess at this time. No pancreatic duct dilation. Spleen: Normal in size without focal abnormality. Adrenals/Urinary Tract: Kidneys enhance normally and symmetrically. No urinary tract calculi or obstructive uropathy within either kidney. The adrenals are stable. Bladder is unremarkable. Stomach/Bowel: No bowel obstruction or ileus. Normal appendix right lower quadrant. Diffuse colonic diverticulosis with no evidence of acute diverticulitis. Mild wall thickening of the distal esophagus at the gastroesophageal junction, which could reflect sequela of reflux or esophagitis. Vascular/Lymphatic: Splenic vein, SMV, and portal vein are patent. Atherosclerosis of the abdominal aorta again noted. No pathologic adenopathy. Reproductive: Persistent fluid-filled endometrial cavity, measuring up to 3 cm in thickness, unchanged in appearance. Gynecologic consultation again recommended to exclude underlying cervical or endometrial cancer. No adnexal masses. Other: No free fluid or free intraperitoneal gas. No abdominal wall hernia. Musculoskeletal: No acute or destructive bony abnormalities. 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 Reconstructed images demonstrate no additional findings. IMPRESSION: 1. Acute pancreatitis, with increased edema and inflammatory changes in  the pancreatic head and uncinate process since prior study. No fluid collection, pseudocyst, or abscess. 2. Increasing globular hyperdense material within the gallbladder lumen, differential would include blood clot versus tumefactive sludge. No evidence of acute cholecystitis on this exam.  Short interval follow-up ultrasound or CT could be performed. 3. Mild wall thickening of the distal thoracic esophagus at the gastroesophageal junction, new since prior study, which could reflect esophagitis or sequela of reflux/emesis. 4. Diffuse colonic diverticulosis without diverticulitis. 5. Distended fluid-filled endometrial cavity, unchanged since prior CT and ultrasound. Leading diagnostic consideration would be cervical mass or stenosis, and gynecologic follow-up is recommended. 6.  Aortic Atherosclerosis (ICD10-I70.0). Electronically Signed   By: Bobbye Burrow M.D.   On: 01/15/2024 12:52     Principal Problem:   Acute pancreatitis     Doroteo Gasmen, [facility]  If 7PM-7AM, please contact night-coverage [email or web address]   LOS: 5 days

## 2024-01-16 NOTE — Plan of Care (Signed)

## 2024-01-16 NOTE — Progress Notes (Signed)
 Subjective/Chief Complaint: Patient complains that central pain is worse than yesterday and is more bloated.     Objective: Vital signs in last 24 hours: Temp:  [98 F (36.7 C)-98.4 F (36.9 C)] 98.3 F (36.8 C) (05/18 0348) Pulse Rate:  [76-90] 89 (05/18 0348) Resp:  [16-18] 18 (05/18 0348) BP: (143-159)/(72-84) 155/72 (05/18 0348) SpO2:  [96 %-98 %] 98 % (05/18 0348) Last BM Date : 01/10/24  Intake/Output from previous day: No intake/output data recorded. Intake/Output this shift: No intake/output data recorded.  ABD: TENDER periumbilical region.  Lab Results:  Recent Labs    01/15/24 0947  WBC 9.1  HGB 10.8*  HCT 32.1*  PLT 453*   BMET Recent Labs    01/15/24 0522 01/15/24 0947  NA 134* 131*  K 4.0 3.9  CL 102 97*  CO2 25 24  GLUCOSE 106* 97  BUN 14 14  CREATININE 1.02* 1.01*  CALCIUM  8.7* 8.7*   PT/INR No results for input(s): "LABPROT", "INR" in the last 72 hours. ABG No results for input(s): "PHART", "HCO3" in the last 72 hours.  Invalid input(s): "PCO2", "PO2"  Studies/Results: CT ABDOMEN PELVIS W CONTRAST Result Date: 01/15/2024 CLINICAL DATA:  Pancreatitis, acute, severe pt with cholecystitis/pancreatitis and prior diverticulitis. worsening periumbilical pain EXAM: CT ABDOMEN AND PELVIS WITH CONTRAST TECHNIQUE: Multidetector CT imaging of the abdomen and pelvis was performed using the standard protocol following bolus administration of intravenous contrast. RADIATION DOSE REDUCTION: This exam was performed according to the departmental dose-optimization program which includes automated exposure control, adjustment of the mA and/or kV according to patient size and/or use of iterative reconstruction technique. CONTRAST:  OMNIPAQUE  IOHEXOL  300 MG/ML  SOLN COMPARISON:  01/11/2024 CT, 01/02/2024 CT, 12/07/2023 ultrasound FINDINGS: Lower chest: Bibasilar subsegmental atelectasis. No acute pleural or parenchymal lung disease. Hepatobiliary:  Increased globular high attenuation material within the gallbladder lumen, which could reflect blood products given significant increased since the 01/11/2024 exam and new appearance since 01/02/2024. This could reflect blood clot or tumefactive sludge. The pericholecystic fat stranding noted previously has improved in the interim. No biliary duct dilation. Liver is unremarkable. Pancreas: Progressive inflammatory changes are seen within the head and uncinate process of the pancreas consistent with acute pancreatitis. There is no evidence of fluid collection, pseudocyst, or abscess at this time. No pancreatic duct dilation. Spleen: Normal in size without focal abnormality. Adrenals/Urinary Tract: Kidneys enhance normally and symmetrically. No urinary tract calculi or obstructive uropathy within either kidney. The adrenals are stable. Bladder is unremarkable. Stomach/Bowel: No bowel obstruction or ileus. Normal appendix right lower quadrant. Diffuse colonic diverticulosis with no evidence of acute diverticulitis. Mild wall thickening of the distal esophagus at the gastroesophageal junction, which could reflect sequela of reflux or esophagitis. Vascular/Lymphatic: Splenic vein, SMV, and portal vein are patent. Atherosclerosis of the abdominal aorta again noted. No pathologic adenopathy. Reproductive: Persistent fluid-filled endometrial cavity, measuring up to 3 cm in thickness, unchanged in appearance. Gynecologic consultation again recommended to exclude underlying cervical or endometrial cancer. No adnexal masses. Other: No free fluid or free intraperitoneal gas. No abdominal wall hernia. Musculoskeletal: No acute or destructive bony abnormalities. 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 Reconstructed images demonstrate no additional findings. IMPRESSION: 1. Acute pancreatitis, with increased edema and inflammatory changes in the pancreatic head and uncinate  process since prior study. No fluid collection, pseudocyst, or abscess. 2. Increasing globular hyperdense material within the gallbladder lumen, differential would include blood clot versus tumefactive  sludge. No evidence of acute cholecystitis on this exam. Short interval follow-up ultrasound or CT could be performed. 3. Mild wall thickening of the distal thoracic esophagus at the gastroesophageal junction, new since prior study, which could reflect esophagitis or sequela of reflux/emesis. 4. Diffuse colonic diverticulosis without diverticulitis. 5. Distended fluid-filled endometrial cavity, unchanged since prior CT and ultrasound. Leading diagnostic consideration would be cervical mass or stenosis, and gynecologic follow-up is recommended. 6.  Aortic Atherosclerosis (ICD10-I70.0). Electronically Signed   By: Bobbye Burrow M.D.   On: 01/15/2024 12:52    Anti-infectives: Anti-infectives (From admission, onward)    Start     Dose/Rate Route Frequency Ordered Stop   01/11/24 2200  metroNIDAZOLE  (FLAGYL ) IVPB 500 mg       Placed in "And" Linked Group   500 mg 100 mL/hr over 60 Minutes Intravenous Every 12 hours 01/11/24 0943     01/11/24 1300  doxycycline  (VIBRA -TABS) tablet 100 mg        100 mg Oral Every 12 hours 01/11/24 0937     01/11/24 1200  cefTRIAXone  (ROCEPHIN ) 2 g in sodium chloride  0.9 % 100 mL IVPB        2 g 200 mL/hr over 30 Minutes Intravenous Daily 01/11/24 1058     01/11/24 0945  cefTRIAXone  (ROCEPHIN ) 2 g in sodium chloride  0.9 % 100 mL IVPB  Status:  Discontinued        2 g 200 mL/hr over 30 Minutes Intravenous  Once 01/11/24 0943 01/11/24 1057   01/11/24 0945  ceFEPIme  (MAXIPIME ) 2 g in sodium chloride  0.9 % 100 mL IVPB  Status:  Discontinued       Placed in "And" Linked Group   2 g 200 mL/hr over 30 Minutes Intravenous Every 24 hours 01/11/24 0943 01/11/24 0944   01/11/24 0630  ceFEPIme  (MAXIPIME ) 2 g in sodium chloride  0.9 % 100 mL IVPB       Placed in "And" Linked Group    2 g 200 mL/hr over 30 Minutes Intravenous  Once 01/11/24 0622 01/11/24 0822   01/11/24 0630  metroNIDAZOLE  (FLAGYL ) IVPB 500 mg       Placed in "And" Linked Group   500 mg 100 mL/hr over 60 Minutes Intravenous  Once 01/11/24 0622 01/11/24 1017       Assessment/Plan:  Biliary pancreatitis and probable acute cholecystitis, CT 5/4 with mild diverticulitis - CT 5/13 with gallstones at the neck of the gallbladder, mild pericholecystic fluid, possible cholecystitis; enlarged and prominent pancreas with small amount of fluid surrounding the inferior margin of the body of the pancreas; diverticulosis without active diverticulitis.  CT 5/4 with mild diverticulitis at junction of descending and sigmoid colon.   - RUQ US  with mild to moderate gallbladder wall thickening, no gallstones or sludge noted - lipase 6697 on admit  Repeat labs 5/17 were improved with no leukocytosis and lipase continuing to come down.  LFTs normal.  Albumin very low.  Unfortunately, patient did have increased edema and inflammatory changes around the pancreatic head and uncinate process which was consistent with clinical exam and history.  Patient also had some thickening of the esophagus consistent with some esophagitis.\  Given the worsening pancreatitis, will plan on holding off on laparoscopic cholecystectomy right now for increased risk of injuring adjacent structures, bleeding, and open operation.  Recommend working on nutrition.  Will allow clear and full liquids, but would not be surprised if patient needs core track or TPN for nutrition.  Patient reports significant nausea.  Patient may also need some Carafate.    Discussed situation with the patient and her daughter.  Continue pain medication and antiemetics.      FEN: FLD, IVF per primary team. Npo after midnight for possible lap chole tomorrow.   VTE: LMWH ID: rocephin /flagyl    - per [facility] -  HTN HLD T2DM Recent uterine perforation s/p repair 5/8 by Dr.  Vallarie Gauze     LOS: 5 days    Lockie Rima MD  01/16/2024  Highly complex medical decision making

## 2024-01-17 ENCOUNTER — Inpatient Hospital Stay (HOSPITAL_COMMUNITY)
Admit: 2024-01-17 | Discharge: 2024-01-17 | Disposition: A | Discharge status: Home / Self Care | Attending: Surgery | Admitting: Surgery

## 2024-01-17 ENCOUNTER — Inpatient Hospital Stay (HOSPITAL_COMMUNITY): Admitting: Registered Nurse

## 2024-01-17 ENCOUNTER — Encounter (HOSPITAL_COMMUNITY)
Admission: EM | Disposition: A | Discharge status: Home Health | Payer: Self-pay | Source: Home / Self Care | Attending: Internal Medicine

## 2024-01-17 ENCOUNTER — Encounter (HOSPITAL_COMMUNITY): Payer: Self-pay | Admitting: Internal Medicine

## 2024-01-17 DIAGNOSIS — F17210 Nicotine dependence, cigarettes, uncomplicated: Secondary | ICD-10-CM

## 2024-01-17 DIAGNOSIS — R9389 Abnormal findings on diagnostic imaging of other specified body structures: Secondary | ICD-10-CM

## 2024-01-17 DIAGNOSIS — K851 Biliary acute pancreatitis without necrosis or infection: Secondary | ICD-10-CM | POA: Diagnosis not present

## 2024-01-17 DIAGNOSIS — K812 Acute cholecystitis with chronic cholecystitis: Secondary | ICD-10-CM | POA: Diagnosis not present

## 2024-01-17 DIAGNOSIS — I129 Hypertensive chronic kidney disease with stage 1 through stage 4 chronic kidney disease, or unspecified chronic kidney disease: Secondary | ICD-10-CM

## 2024-01-17 DIAGNOSIS — N1831 Chronic kidney disease, stage 3a: Secondary | ICD-10-CM

## 2024-01-17 HISTORY — PX: CHOLECYSTECTOMY: SHX55

## 2024-01-17 HISTORY — PX: DILATION AND CURETTAGE OF UTERUS: SHX78

## 2024-01-17 LAB — COMPREHENSIVE METABOLIC PANEL WITH GFR
ALT: 17 U/L (ref 0–44)
AST: 14 U/L — ABNORMAL LOW (ref 15–41)
Albumin: 2.8 g/dL — ABNORMAL LOW (ref 3.5–5.0)
Alkaline Phosphatase: 83 U/L (ref 38–126)
Anion gap: 13 (ref 5–15)
BUN: 14 mg/dL (ref 8–23)
CO2: 25 mmol/L (ref 22–32)
Calcium: 9.1 mg/dL (ref 8.9–10.3)
Chloride: 94 mmol/L — ABNORMAL LOW (ref 98–111)
Creatinine, Ser: 1.17 mg/dL — ABNORMAL HIGH (ref 0.44–1.00)
GFR, Estimated: 48 mL/min — ABNORMAL LOW (ref >60)
Glucose, Bld: 88 mg/dL (ref 70–99)
Potassium: 3.6 mmol/L (ref 3.5–5.1)
Sodium: 132 mmol/L — ABNORMAL LOW (ref 135–145)
Total Bilirubin: 1 mg/dL (ref 0.0–1.2)
Total Protein: 6.8 g/dL (ref 6.5–8.1)

## 2024-01-17 LAB — GLUCOSE, CAPILLARY
Glucose-Capillary: 82 mg/dL (ref 70–99)
Glucose-Capillary: 85 mg/dL (ref 70–99)
Glucose-Capillary: 92 mg/dL (ref 70–99)
Glucose-Capillary: 148 mg/dL — ABNORMAL HIGH (ref 70–99)
Glucose-Capillary: 234 mg/dL — ABNORMAL HIGH (ref 70–99)

## 2024-01-17 LAB — CBC
HCT: 33.3 % — ABNORMAL LOW (ref 36.0–46.0)
Hemoglobin: 11.2 g/dL — ABNORMAL LOW (ref 12.0–15.0)
MCH: 30.6 pg (ref 26.0–34.0)
MCHC: 33.6 g/dL (ref 30.0–36.0)
MCV: 91 fL (ref 80.0–100.0)
Platelets: 485 10*3/uL — ABNORMAL HIGH (ref 150–400)
RBC: 3.66 MIL/uL — ABNORMAL LOW (ref 3.87–5.11)
RDW: 14.4 % (ref 11.5–15.5)
WBC: 8.1 10*3/uL (ref 4.0–10.5)
nRBC: 0 % (ref 0.0–0.2)

## 2024-01-17 LAB — LIPASE, BLOOD: Lipase: 63 U/L — ABNORMAL HIGH (ref 11–51)

## 2024-01-17 SURGERY — LAPAROSCOPIC CHOLECYSTECTOMY WITH INTRAOPERATIVE CHOLANGIOGRAM
Anesthesia: General

## 2024-01-17 MED ORDER — MENTHOL 3 MG MT LOZG: 1.0000 | LOZENGE | OROMUCOSAL | Status: DC | PRN

## 2024-01-17 MED ORDER — FENTANYL CITRATE (PF) 100 MCG/2ML IJ SOLN
INTRAMUSCULAR | Status: DC | PRN
  Administered 2024-01-17: 100 ug via INTRAVENOUS
  Administered 2024-01-17 (×2): 50 ug via INTRAVENOUS

## 2024-01-17 MED ORDER — HYDROMORPHONE HCL 1 MG/ML IJ SOLN
INTRAMUSCULAR | Status: AC
  Filled 2024-01-17: qty 1

## 2024-01-17 MED ORDER — INSULIN ASPART 100 UNIT/ML IJ SOLN: 0.0000 [IU] | INTRAMUSCULAR | Status: DC | PRN

## 2024-01-17 MED ORDER — BUPIVACAINE-EPINEPHRINE 0.25% -1:200000 IJ SOLN
INTRAMUSCULAR | Status: DC | PRN
  Administered 2024-01-17: 50 mL

## 2024-01-17 MED ORDER — SUGAMMADEX SODIUM 200 MG/2ML IV SOLN
INTRAVENOUS | Status: DC | PRN
  Administered 2024-01-17: 200 mg via INTRAVENOUS

## 2024-01-17 MED ORDER — PROPOFOL 10 MG/ML IV BOLUS
INTRAVENOUS | Status: AC
  Filled 2024-01-17: qty 20

## 2024-01-17 MED ORDER — SUCCINYLCHOLINE CHLORIDE 200 MG/10ML IV SOSY
PREFILLED_SYRINGE | INTRAVENOUS | Status: AC
  Filled 2024-01-17: qty 10

## 2024-01-17 MED ORDER — PROPOFOL 10 MG/ML IV BOLUS
INTRAVENOUS | Status: DC | PRN
Start: 2024-01-17 — End: 2024-01-17
  Administered 2024-01-17: 20 mg via INTRAVENOUS
  Administered 2024-01-17: 90 mg via INTRAVENOUS

## 2024-01-17 MED ORDER — PHENYLEPHRINE 80 MCG/ML (10ML) SYRINGE FOR IV PUSH (FOR BLOOD PRESSURE SUPPORT)
PREFILLED_SYRINGE | INTRAVENOUS | Status: AC
Start: 2024-01-17 — End: ?
  Filled 2024-01-17: qty 10

## 2024-01-17 MED ORDER — LACTATED RINGERS IV SOLN: INTRAVENOUS | Status: DC

## 2024-01-17 MED ORDER — BUPIVACAINE LIPOSOME 1.3 % IJ SUSP
INTRAMUSCULAR | Status: AC
  Filled 2024-01-17: qty 20

## 2024-01-17 MED ORDER — DEXAMETHASONE SODIUM PHOSPHATE 10 MG/ML IJ SOLN
INTRAMUSCULAR | Status: DC | PRN
  Administered 2024-01-17: 6 mg via INTRAVENOUS

## 2024-01-17 MED ORDER — SUCCINYLCHOLINE CHLORIDE 200 MG/10ML IV SOSY
PREFILLED_SYRINGE | INTRAVENOUS | Status: DC | PRN
Start: 2024-01-17 — End: 2024-01-17
  Administered 2024-01-17: 100 mg via INTRAVENOUS

## 2024-01-17 MED ORDER — ACETAMINOPHEN 10 MG/ML IV SOLN
INTRAVENOUS | Status: AC
  Filled 2024-01-17: qty 100

## 2024-01-17 MED ORDER — HYDROMORPHONE HCL 1 MG/ML IJ SOLN
0.2500 mg | INTRAMUSCULAR | Status: DC | PRN
  Administered 2024-01-17 (×4): 0.5 mg via INTRAVENOUS

## 2024-01-17 MED ORDER — CHLORHEXIDINE GLUCONATE 0.12 % MT SOLN
15.0000 mL | Freq: Once | OROMUCOSAL | Status: AC
  Administered 2024-01-17: 15 mL via OROMUCOSAL

## 2024-01-17 MED ORDER — SODIUM CHLORIDE 0.9 % IV SOLN
INTRAVENOUS | Status: DC | PRN
  Administered 2024-01-17: 9 mL

## 2024-01-17 MED ORDER — LACTATED RINGERS IV BOLUS
1000.0000 mL | Freq: Once | INTRAVENOUS | Status: AC
  Administered 2024-01-17: 1000 mL via INTRAVENOUS

## 2024-01-17 MED ORDER — ENSURE PRE-SURGERY PO LIQD: 296.0000 mL | Freq: Once | ORAL | Status: DC

## 2024-01-17 MED ORDER — CHLORHEXIDINE GLUCONATE CLOTH 2 % EX PADS
6.0000 | MEDICATED_PAD | Freq: Once | CUTANEOUS | Status: DC
Start: 2024-01-17 — End: 2024-01-17

## 2024-01-17 MED ORDER — SIMETHICONE 40 MG/0.6ML PO SUSP: 80.0000 mg | Freq: Four times a day (QID) | ORAL | Status: DC | PRN

## 2024-01-17 MED ORDER — FENTANYL CITRATE (PF) 100 MCG/2ML IJ SOLN
INTRAMUSCULAR | Status: AC
  Filled 2024-01-17: qty 2

## 2024-01-17 MED ORDER — DIPHENHYDRAMINE HCL 50 MG/ML IJ SOLN: 12.5000 mg | Freq: Four times a day (QID) | INTRAMUSCULAR | Status: DC | PRN

## 2024-01-17 MED ORDER — PHENOL 1.4 % MT LIQD: 2.0000 | OROMUCOSAL | Status: DC | PRN

## 2024-01-17 MED ORDER — ROCURONIUM BROMIDE 10 MG/ML (PF) SYRINGE
PREFILLED_SYRINGE | INTRAVENOUS | Status: DC | PRN
  Administered 2024-01-17: 40 mg via INTRAVENOUS

## 2024-01-17 MED ORDER — SALINE SPRAY 0.65 % NA SOLN: 1.0000 | Freq: Four times a day (QID) | NASAL | Status: DC | PRN

## 2024-01-17 MED ORDER — ROCURONIUM BROMIDE 10 MG/ML (PF) SYRINGE
PREFILLED_SYRINGE | INTRAVENOUS | Status: AC
  Filled 2024-01-17: qty 10

## 2024-01-17 MED ORDER — BUPIVACAINE-EPINEPHRINE (PF) 0.25% -1:200000 IJ SOLN
INTRAMUSCULAR | Status: AC
  Filled 2024-01-17: qty 30

## 2024-01-17 MED ORDER — CALCIUM POLYCARBOPHIL 625 MG PO TABS
625.0000 mg | ORAL_TABLET | Freq: Two times a day (BID) | ORAL | Status: DC
  Administered 2024-01-17 – 2024-01-21 (×8): 625 mg via ORAL
  Filled 2024-01-17 (×8): qty 1

## 2024-01-17 MED ORDER — ACETAMINOPHEN 10 MG/ML IV SOLN
INTRAVENOUS | Status: DC | PRN
  Administered 2024-01-17: 1000 mg via INTRAVENOUS

## 2024-01-17 MED ORDER — ONDANSETRON HCL 4 MG/2ML IJ SOLN
INTRAMUSCULAR | Status: AC
  Filled 2024-01-17: qty 2

## 2024-01-17 MED ORDER — GABAPENTIN 300 MG PO CAPS: 300.0000 mg | ORAL_CAPSULE | ORAL | Status: DC

## 2024-01-17 MED ORDER — ACETAMINOPHEN 500 MG PO TABS: 1000.0000 mg | ORAL_TABLET | ORAL | Status: DC

## 2024-01-17 MED ORDER — 0.9 % SODIUM CHLORIDE (POUR BTL) OPTIME
TOPICAL | Status: DC | PRN
  Administered 2024-01-17: 1000 mL

## 2024-01-17 MED ORDER — LABETALOL HCL 5 MG/ML IV SOLN: 5.0000 mg | INTRAVENOUS | Status: DC | PRN

## 2024-01-17 MED ORDER — BISACODYL 10 MG RE SUPP: 10.0000 mg | Freq: Two times a day (BID) | RECTAL | Status: DC | PRN

## 2024-01-17 MED ORDER — ACETAMINOPHEN 500 MG PO TABS
500.0000 mg | ORAL_TABLET | Freq: Three times a day (TID) | ORAL | Status: DC
  Administered 2024-01-17: 500 mg via ORAL
  Filled 2024-01-17: qty 1

## 2024-01-17 MED ORDER — CYCLOBENZAPRINE HCL 5 MG PO TABS: 5.0000 mg | ORAL_TABLET | Freq: Three times a day (TID) | ORAL | Status: DC | PRN

## 2024-01-17 MED ORDER — LIDOCAINE HCL (PF) 2 % IJ SOLN
INTRAMUSCULAR | Status: AC
Start: 2024-01-17 — End: ?
  Filled 2024-01-17: qty 5

## 2024-01-17 MED ORDER — LACTATED RINGERS IV BOLUS: 1000.0000 mL | Freq: Three times a day (TID) | INTRAVENOUS | Status: AC | PRN

## 2024-01-17 MED ORDER — CHLORHEXIDINE GLUCONATE CLOTH 2 % EX PADS: 6.0000 | MEDICATED_PAD | Freq: Once | CUTANEOUS | Status: DC

## 2024-01-17 MED ORDER — LABETALOL HCL 5 MG/ML IV SOLN
5.0000 mg | INTRAVENOUS | Status: AC | PRN
  Administered 2024-01-17 (×2): 5 mg via INTRAVENOUS

## 2024-01-17 MED ORDER — BUPIVACAINE LIPOSOME 1.3 % IJ SUSP: 20.0000 mL | Freq: Once | INTRAMUSCULAR | Status: DC

## 2024-01-17 MED ORDER — DEXAMETHASONE SODIUM PHOSPHATE 10 MG/ML IJ SOLN
INTRAMUSCULAR | Status: AC
Start: 2024-01-17 — End: ?
  Filled 2024-01-17: qty 1

## 2024-01-17 MED ORDER — ONDANSETRON HCL 4 MG/2ML IJ SOLN
INTRAMUSCULAR | Status: DC | PRN
  Administered 2024-01-17: 4 mg via INTRAVENOUS

## 2024-01-17 MED ORDER — LIDOCAINE HCL (PF) 2 % IJ SOLN
INTRAMUSCULAR | Status: DC | PRN
  Administered 2024-01-17: 100 mg via INTRADERMAL

## 2024-01-17 MED ORDER — SODIUM CHLORIDE 0.9 % IR SOLN
Status: DC | PRN
  Administered 2024-01-17: 1000 mL

## 2024-01-17 MED ORDER — HYDROMORPHONE HCL 1 MG/ML IJ SOLN
0.2500 mg | INTRAMUSCULAR | Status: DC | PRN
  Administered 2024-01-17: 0.5 mg via INTRAVENOUS

## 2024-01-17 MED ORDER — LABETALOL HCL 5 MG/ML IV SOLN
INTRAVENOUS | Status: AC
  Filled 2024-01-17: qty 4

## 2024-01-17 MED ORDER — NAPHAZOLINE-GLYCERIN 0.012-0.25 % OP SOLN
1.0000 [drp] | Freq: Four times a day (QID) | OPHTHALMIC | Status: DC | PRN
Start: 2024-01-17 — End: 2024-01-21

## 2024-01-17 MED ORDER — METRONIDAZOLE 500 MG/100ML IV SOLN
INTRAVENOUS | Status: DC | PRN
Start: 2024-01-17 — End: 2024-01-17
  Administered 2024-01-17: 500 mg via INTRAVENOUS

## 2024-01-17 MED ORDER — SODIUM CHLORIDE 0.9 % IV SOLN: 2.0000 g | INTRAVENOUS | Status: DC

## 2024-01-17 MED ORDER — MAGIC MOUTHWASH: 15.0000 mL | Freq: Four times a day (QID) | ORAL | Status: DC | PRN

## 2024-01-17 SURGICAL SUPPLY — 36 items
BAG COUNTER SPONGE SURGICOUNT (BAG) ×1 IMPLANT
CABLE HIGH FREQUENCY MONO STRZ (ELECTRODE) IMPLANT
CLIP APPLIE 5 13 M/L LIGAMAX5 (MISCELLANEOUS) IMPLANT
CLIP APPLIE ROT 10 11.4 M/L (STAPLE) IMPLANT
COVER MAYO STAND XLG (MISCELLANEOUS) ×1 IMPLANT
COVER SURGICAL LIGHT HANDLE (MISCELLANEOUS) ×1 IMPLANT
DRAPE 3/4 80X56 (DRAPES) IMPLANT
DRAPE C-ARM 42X120 X-RAY (DRAPES) ×1 IMPLANT
DRAPE UTILITY XL STRL (DRAPES) ×1 IMPLANT
DRAPE WARM FLUID 44X44 (DRAPES) ×1 IMPLANT
DRSG TEGADERM 2-3/8X2-3/4 SM (GAUZE/BANDAGES/DRESSINGS) ×2 IMPLANT
DRSG TEGADERM 6X8 (GAUZE/BANDAGES/DRESSINGS) ×1 IMPLANT
ELECT REM PT RETURN 15FT ADLT (MISCELLANEOUS) ×1 IMPLANT
ENDOLOOP SUT PDS II 0 18 (SUTURE) IMPLANT
GAUZE SPONGE 2X2 8PLY STRL LF (GAUZE/BANDAGES/DRESSINGS) IMPLANT
GLOVE ECLIPSE 8.0 STRL XLNG CF (GLOVE) ×1 IMPLANT
GLOVE INDICATOR 8.0 STRL GRN (GLOVE) ×1 IMPLANT
GOWN STRL REUS W/ TWL XL LVL3 (GOWN DISPOSABLE) ×1 IMPLANT
IRRIGATION SUCT STRKRFLW 2 WTP (MISCELLANEOUS) ×1 IMPLANT
KIT BASIN OR (CUSTOM PROCEDURE TRAY) ×1 IMPLANT
KIT TURNOVER KIT A (KITS) IMPLANT
PENCIL SMOKE EVACUATOR (MISCELLANEOUS) IMPLANT
POUCH RETRIEVAL ECOSAC 10 (ENDOMECHANICALS) ×1 IMPLANT
SCISSORS LAP 5X35 DISP (ENDOMECHANICALS) ×1 IMPLANT
SET CHOLANGIOGRAPH MIX (MISCELLANEOUS) ×1 IMPLANT
SET TUBE SMOKE EVAC HIGH FLOW (TUBING) ×1 IMPLANT
SLEEVE ADV FIXATION 5X100MM (TROCAR) ×1 IMPLANT
SPIKE FLUID TRANSFER (MISCELLANEOUS) ×1 IMPLANT
SUT MNCRL AB 4-0 PS2 18 (SUTURE) ×1 IMPLANT
SUT PDS AB 1 CT 36 (SUTURE) ×1 IMPLANT
SYR 20ML LL LF (SYRINGE) ×1 IMPLANT
TOWEL OR 17X26 10 PK STRL BLUE (TOWEL DISPOSABLE) ×1 IMPLANT
TRAY LAPAROSCOPIC (CUSTOM PROCEDURE TRAY) ×1 IMPLANT
TROCAR ADV FIXATION 12X100MM (TROCAR) ×1 IMPLANT
TROCAR ADV FIXATION 5X100MM (TROCAR) ×1 IMPLANT
TROCAR Z-THREAD OPTICAL 5X100M (TROCAR) ×1 IMPLANT

## 2024-01-17 NOTE — Anesthesia Preprocedure Evaluation (Signed)
 Anesthesia Evaluation  Patient identified by MRN, date of birth, ID band Patient awake    Reviewed: Allergy & Precautions, NPO status , Patient's Chart, lab work & pertinent test results, reviewed documented beta blocker date and time   History of Anesthesia Complications Negative for: history of anesthetic complications  Airway Mallampati: III  TM Distance: >3 FB     Dental no notable dental hx.    Pulmonary neg COPD, Current Smoker and Patient abstained from smoking.   breath sounds clear to auscultation       Cardiovascular hypertension, (-) angina + Peripheral Vascular Disease  (-) CAD and (-) Past MI + Valvular Problems/Murmurs  Rhythm:Regular Rate:Normal     Neuro/Psych neg Seizures  Neuromuscular disease    GI/Hepatic ,GERD  ,,(+) neg Cirrhosis      Pancreatitis/cholecystitis   Endo/Other  diabetes, Type 2Hypothyroidism    Renal/GU CRFRenal disease     Musculoskeletal  (+) Arthritis , Osteoarthritis,    Abdominal   Peds  Hematology  (+) Blood dyscrasia, anemia   Anesthesia Other Findings   Reproductive/Obstetrics                              Anesthesia Physical Anesthesia Plan  ASA: 3  Anesthesia Plan: General   Post-op Pain Management:    Induction: Intravenous and Rapid sequence  PONV Risk Score and Plan: 2 and Ondansetron  and Dexamethasone   Airway Management Planned: Oral ETT  Additional Equipment:   Intra-op Plan:   Post-operative Plan: Extubation in OR  Informed Consent: I have reviewed the patients History and Physical, chart, labs and discussed the procedure including the risks, benefits and alternatives for the proposed anesthesia with the patient or authorized representative who has indicated his/her understanding and acceptance.     Dental advisory given  Plan Discussed with: CRNA  Anesthesia Plan Comments:          Anesthesia Quick  Evaluation

## 2024-01-17 NOTE — H&P (Signed)
 Gynecologic Oncology H&P  Alexandria Durham 78 y.o. female  CC:  Chief Complaint  Patient presents with   Abdominal Pain    HPI: Alexandria Durham is a 78 year old female currently admitted for lap chole today with Dr. Hershell Lose who presented to the ER with abdominal pain, nausea, and emesis on 01/11/2024 starting after recent D&C/hysteroscopy on 01/06/24 with Dr. Vallarie Gauze. She underwent CT AP with contrast on 01/11/2024 with *Findings consistent with acute cholecystitis and pancreatitis. Gallbladder ultrasound as well as biliary study recommended for evaluation of gallbladder exclude acute cholecystitis. *No obvious choledocholithiasis noted on these images. *No drainable peripancreatic fluid collections or pseudocyst formation. *Diverticulosis of the descending colon without diverticulitis. *Uterine fibroid. *Chronic basilar hypoventilatory atelectatic changes with mild bronchial dilatation could correlate with chronic bronchitis. *Aortic atherosclerosis.  Labs in the ER included lipase at 6,697, glucose 225, creatinine 1.20, AST 297, ALT 107, alk phos 245, WBC 12.1, PLT 453. US  abdominal was then performed on 01/11/24 returning with 1. There is mild-to-moderate diffuse circumferential wall thickening of the gallbladder. No gallstones or sludge seen. No pericholecystic free fluid. The technologist noted positive sonographic Murphy's sign. Findings are equivocal for acute cholecystitis. If clinical suspicion is high, further evaluation with nuclear medicine HIDA scan is recommended. 2. Increased hepatic echogenicity, a nonspecific finding that is most commonly seen on the basis of steatosis in the absence of known liver disease. 3. Otherwise unremarkable exam.  Most recent CT scan was on 01/15/2024: 1. Acute pancreatitis, with increased edema and inflammatory changes in the pancreatic head and uncinate process since prior study. No fluid collection, pseudocyst, or abscess. 2. Increasing globular hyperdense material  within the gallbladder lumen, differential would include blood clot versus tumefactive sludge. No evidence of acute cholecystitis on this exam. Short interval follow-up ultrasound or CT could be performed. 3. Mild wall thickening of the distal thoracic esophagus at the gastroesophageal junction, new since prior study, which could reflect esophagitis or sequela of reflux/emesis. 4. Diffuse colonic diverticulosis without diverticulitis. 5. Distended fluid-filled endometrial cavity, unchanged since prior CT and ultrasound. Leading diagnostic consideration would be cervical mass or stenosis, and gynecologic follow-up is recommended. 6.  Aortic Atherosclerosis  She has had previous CT imaging for abdominal pain and nausea, first on 01/02/2024 returning with mild diverticulitis near the junction of the descending and sigmoid colon. No evidence of perforation or abscess. Distension of endometrial cavity again seen, which may be due to diffuse endometrial thickening or hydrometros. Endometrial carcinoma cannot be excluded in a postmenopausal female. GYN consultation is recommended. She was admitted at this time with IV antibiotics for diverticulitis and was seen by GYN as well given CT findings. She went to the OR on 01/06/2024 for thickened endometrium and underwent D&C, hysteroscopy, Pap smear, EUA, Diagnostic laparoscopy with Dr. Vallarie Gauze and Dr. Andy Bannister with concern for uterine perforation during the case.   Pathology returned with: A. ENDOCERVICAL CURETTAGE:  - Rare atypical squamous epithelial groups with focal p16 staining,  consistent with low-grade squamous intraepithelial lesion (LSIL/CIN-1).  - Predominantly blood and mucus.  - Rare tiny fragments of unremarkable endocervical glandular epithelium.   B. ENDOMETRIAL CURETTINGS:  - Rare atypical squamous epithelial groups with focal p16 staining, consistent with low-grade squamous intraepithelial lesion (LSIL/CIN-1).  - Fragments of unremarkable myometrium.  -  Unremarkable endocervical glandular epithelium.  - Definite endometrial tissue is not identified.   C. CERVICAL BIOPSY:  - Low-grade squamous intraepithelial lesion (LSIL/CIN-1).  - Underlying fibrous stroma with non-specific necroinflammation.   Past  medical hx includes CKD III, DM, hyperlipidemia, hypothyroidism.  Interval History:  Patient seen in short stay. She reports having a poor appetite for the past week prior to admission. She has had +intermittent nausea/emesis. Her bladder has been functioning without difficulty. She has had vaginal discharge after the [facility] with no bleeding. She said she was told there was something placed in her uterus and would have to be surgically removed. She initially thought she had appendicitis and has been through several scans, multiple medical evaluations.  Review of Systems: See interval.   Current Meds: Current inpatient and outpatient medications reviewed.  Allergy:  Allergies  Allergen Reactions   Ace Inhibitors Hives and Itching   Aspirin Palpitations   Codeine Palpitations   Penicillins Rash    Social Hx:   Social History   Socioeconomic History   Marital status: Single    Spouse name: Not on file   Number of children: Not on file   Years of education: Not on file   Highest education level: Not on file  Occupational History   Not on file  Tobacco Use   Smoking status: Every Day    Current packs/day: 0.50    Types: Cigarettes    Passive exposure: Never   Smokeless tobacco: Never  Substance and Sexual Activity   Alcohol use: No   Drug use: No   Sexual activity: Not on file  Other Topics Concern   Not on file  Social History Narrative   Not on file   Social Drivers of Health   Financial Resource Strain: Not on file  Food Insecurity: No Food Insecurity (01/11/2024)   Hunger Vital Sign    Worried About Running Out of Food in the Last Year: Never true    Ran Out of Food in the Last Year: Never true  Transportation  Needs: No Transportation Needs (01/11/2024)   PRAPARE - Administrator, Civil Service (Medical): No    Lack of Transportation (Non-Medical): No  Physical Activity: Not on file  Stress: Not on file  Social Connections: Unknown (01/11/2024)   Social Connection and Isolation Panel [NHANES]    Frequency of Communication with Friends and Family: Twice a week    Frequency of Social Gatherings with Friends and Family: Twice a week    Attends Religious Services: 1 to 4 times per year    Active Member of [employer] or Organizations: No    Attends Banker Meetings: 1 to 4 times per year    Marital Status: Patient declined  Intimate Partner Violence: Not At Risk (01/11/2024)   Humiliation, Afraid, Rape, and Kick questionnaire    Fear of Current or Ex-Partner: No    Emotionally Abused: No    Physically Abused: No    Sexually Abused: No    Past Surgical Hx:  Past Surgical History:  Procedure Laterality Date   BREAST CYST EXCISION     HEMORRHOID SURGERY     HIP SURGERY     HYSTEROSCOPY WITH D & C N/A 01/06/2024   Procedure: DILATATION AND CURETTAGE /HYSTEROSCOPY;  Surgeon: [name], MD;  Location: [facility];  Service: Gynecology;  Laterality: N/A;  PAP SMEAR (DR TO BRING SUPPLIES)  TUCKERS CARD   JOINT REPLACEMENT     hip replacement   LAPAROSCOPIC LYSIS OF ADHESIONS  01/06/2024   Procedure: LYSIS, ADHESIONS, LAPAROSCOPIC;  Surgeon: [name], MD;  Location: [facility];  Service: Gynecology;;   LAPAROSCOPY N/A 01/06/2024   Procedure: LAPAROSCOPY, DIAGNOSTIC;  Surgeon:  [name], MD;  Location: [facility];  Service: Gynecology;  Laterality: N/A;   TONSILLECTOMY     TUBAL LIGATION      Past Medical Hx:  Past Medical History:  Diagnosis Date   Arthritis    Diabetes mellitus    Hypercholesteremia    Hypertension     Family Hx: History reviewed. No pertinent family history.  Vitals:  Blood pressure (!) 172/74, pulse 79, temperature 98.4 F (36.9 C), temperature source  Oral, resp. rate 16, height 5\' 4"  (1.626 m), weight 156 lb 8.4 oz (71 kg), SpO2 97%.  Physical Exam:  Alert, oriented, resting in bed comfortably in no acute distress. Breathing unlabored.  Assessment/Plan: 78 year old female currently admitted with plans to undergo laparoscopic cholecystectomy today with Dr. Hershell Lose for cholecystitis. On previous imaging, she has been noted to have endometrial thickening with a fluid filled cavity. She underwent D&C hysteroscopy on 01/06/2024 with Dr. Vallarie Gauze during a previous admission with pathology returning with definite endometrial tissue not identified. Dr. Daisey Dryer would like to attempt another dilation and curettage at the time of her lap chole today to obtain repeat sampling on the endometrium given concern for possible malignancy Discussed the plan with the patient who agrees. Dr. Daisey Dryer to see the patient later this am to discuss further.    Suellyn Emory, NP 01/17/2024, 10:13 AM

## 2024-01-17 NOTE — Discharge Instructions (Signed)
 ################################################################  LAPAROSCOPIC SURGERY: POST OP INSTRUCTIONS  ######################################################################  EAT Gradually transition to a high fiber diet with a fiber supplement over the next few weeks after discharge.  Start with a pureed / full liquid diet (see below)  WALK Walk an hour a day.  Control your pain to do that.    CONTROL PAIN Control pain so that you can walk, sleep, tolerate sneezing/coughing, go up/down stairs.  HAVE A BOWEL MOVEMENT DAILY Keep your bowels regular to avoid problems.  OK to try a laxative to override constipation.  OK to use an antidairrheal to slow down diarrhea.  Call if not better after 2 tries  CALL IF YOU HAVE PROBLEMS/CONCERNS Call if you are still struggling despite following these instructions. Call if you have concerns not answered by these instructions  ######################################################################    DIET: Follow a light bland diet & liquids the first 24 hours after arrival home, such as soup, liquids, starches, etc.  Be sure to drink plenty of fluids.  Quickly advance to a usual solid diet within a few days.  Avoid fast food or heavy meals as your are more likely to get nauseated or have irregular bowels.  A low-fat, high-fiber diet for the rest of your life is ideal.  Take your usually prescribed home medications unless otherwise directed. Blood thinners:  You can restart any strong blood thinners after the second postoperative day  for example: COUMADIN (warfarin), XERELTO (rivaroxaban), ELIQUIS (apixaban), PLAVIX (clopidigrel), BRILINTA (ticagrelor), EFFIENT (prasugrel), PRADAXA (dabigatran), etc  Continue aspirin before & after surgery..     Some oozing/bleeding the first 1-2 weeks is common but should taper down & be small volume.    If you are passing many large clots or having uncontrolling bleeding, call your surgeon  PAIN  CONTROL: Pain is best controlled by a usual combination of three different methods TOGETHER: Ice/Heat Over the counter pain medication Prescription pain medication Most patients will experience some swelling and bruising around the incisions.  Ice packs or heating pads (30-60 minutes up to 6 times a day) will help. Use ice for the first few days to help decrease swelling and bruising, then switch to heat to help relax tight/sore spots and speed recovery.  Some people prefer to use ice alone, heat alone, alternating between ice & heat.  Experiment to what works for you.  Swelling and bruising can take several weeks to resolve.   It is helpful to take an over-the-counter pain medication regularly for the first few weeks.  Choose one of the following that works best for you: Naproxen (Aleve, etc)  Two 220mg  tabs twice a day Ibuprofen (Advil, etc) Three 200mg  tabs four times a day (every meal & bedtime) Acetaminophen  (Tylenol , etc) 500-650mg  four times a day (every meal & bedtime) A  prescription for pain medication (such as oxycodone , hydrocodone , tramadol , gabapentin , methocarbamol , etc) should be given to you upon discharge.  Take your pain medication as prescribed.  If you are having problems/concerns with the prescription medicine (does not control pain, nausea, vomiting, rash, itching, etc), please call us  [phone] to see if we need to switch you to a different pain medicine that will work better for you and/or control your side effect better. If you need a refill on your pain medication, please give us  48 hour notice.  contact your pharmacy.  They will contact our office to request authorization. Prescriptions will not be filled after 5 pm or on week-ends  AVOID GETTING CONSTIPATED.   a.  Between the surgery and the pain medications, it is common to experience some constipation.  b.  Drink plenty of liquids c   ake a fiber supplement 2 times day (such as Metamucil, Citrucel, FiberCon,  MiraLax , etc) to have a bowel movement every day. d.  If you have not had a BM by 2 days after surgery: -drink liquids only until you have a bowel movement - take MiraLAX  2 doses every 2 hours until you have a bowel movement   Watch out for diarrhea.   If you have many loose bowel movements, simplify your diet to bland foods & liquids for a few days.   Stop any stool softeners and decrease your fiber supplement.   Switching to mild anti-diarrheal medications (Kayopectate, Pepto Bismol) can help.   If this worsens or does not improve, please call us .  Wash / shower every day.  You may shower over the dressings as they are waterproof.  Continue to shower over incision(s) after the dressing is off.  It is good for closed incisions and even open wounds to be washed every day.  Shower every day.  Short baths are fine.  Wash the incisions and wounds clean with soap & water.    You may leave closed incisions open to air if it is dry.   You may cover the incision with clean gauze & replace it after your daily shower for comfort.  TEGADERM:  You have clear gauze band-aid dressings over your closed incision(s).  Remove the dressings 2 days after surgery = 5/21 Wednesday.    ACTIVITIES as tolerated:   You may resume regular (light) daily activities beginning the next day--such as daily self-care, walking, climbing stairs--gradually increasing activities as tolerated.  If you can walk 30 minutes without difficulty, it is safe to try more intense activity such as jogging, treadmill, bicycling, low-impact aerobics, swimming, etc. Save the most intensive and strenuous activity for last such as sit-ups, heavy lifting, contact sports, etc  Refrain from any heavy lifting or straining until you are off narcotics for pain control.   DO NOT PUSH THROUGH PAIN.  Let pain be your guide: If it hurts to do something, don't do it.  Pain is your body warning you to avoid that activity for another week until the pain goes  down. You may drive when you are no longer taking prescription pain medication, you can comfortably wear a seatbelt, and you can safely maneuver your car and apply brakes. You may have sexual intercourse when it is comfortable.  FOLLOW UP in our office Please call [facility] at [phone] to set up an appointment to see your surgeon in the office for a follow-up appointment approximately 2-3 weeks after your surgery. Make sure that you call for this appointment the day you arrive home to insure a convenient appointment time.  10. IF YOU HAVE DISABILITY OR FAMILY LEAVE FORMS, BRING THEM TO THE OFFICE FOR PROCESSING.  DO NOT GIVE THEM TO YOUR DOCTOR.   WHEN TO CALL US  [phone]: Poor pain control Reactions / problems with new medications (rash/itching, nausea, etc)  Fever over 101.5 F (38.5 C) Inability to urinate Nausea and/or vomiting Worsening swelling or bruising Continued bleeding from incision. Increased pain, redness, or drainage from the incision   The clinic staff is available to answer your questions during regular business hours (8:30am-5pm).  Please don't hesitate to call and ask to speak to one of our nurses for clinical concerns.   If you have  a medical emergency, go to the nearest emergency room or call 911.  A surgeon from [facility] is always on call at the [facility], [place] [address], [address], [place], [place]  [address] ? MAIN: [phone] ? TOLL FREE: [phone] ?  FAX [phone] [email or web address]  ##############################################################

## 2024-01-17 NOTE — Anesthesia Procedure Notes (Signed)
 Procedure Name: Intubation Date/Time: 01/17/2024 12:46 PM  Performed by: Marshall Skeeter, CRNAPre-anesthesia Checklist: Patient identified, Emergency Drugs available, Suction available, Patient being monitored and Timeout performed Patient Re-evaluated:Patient Re-evaluated prior to induction Oxygen Delivery Method: Circle system utilized Preoxygenation: Pre-oxygenation with 100% oxygen Induction Type: IV induction, Rapid sequence and Cricoid Pressure applied Laryngoscope Size: Glidescope and 3 Grade View: Grade I Tube type: Parker flex tip Number of attempts: 1 Airway Equipment and Method: Rigid stylet and Video-laryngoscopy Placement Confirmation: ETT inserted through vocal cords under direct vision, positive ETCO2 and breath sounds checked- equal and bilateral Secured at: 22 cm Tube secured with: Tape Dental Injury: Teeth and Oropharynx as per pre-operative assessment  Comments: Elective Glidescope intubation performed byCarter, [place]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

## 2024-01-17 NOTE — Plan of Care (Signed)

## 2024-01-17 NOTE — Interval H&P Note (Signed)
 History and Physical Interval Note:  01/17/2024 12:17 PM  Alexandria Durham  has presented today for surgery, with the diagnosis of GALLSTONES.  The various methods of treatment have been discussed with the patient and family. After consideration of risks, benefits and other options for treatment, the patient has consented to  Procedure(s): LAPAROSCOPIC CHOLECYSTECTOMY WITH INTRAOPERATIVE CHOLANGIOGRAM (N/A) DILATION AND CURETTAGE (N/A) as a surgical intervention.  The patient's history has been reviewed, patient examined, no change in status, stable for surgery.  I have reviewed the patient's chart and labs.  Questions were answered to the patient's satisfaction.     Sabriel Borromeo

## 2024-01-17 NOTE — Progress Notes (Signed)
 Progress Note  * Day of Surgery *  Subjective: Pt reports pain improving but still having some epigastric pain and some lower chest upper abdominal pain with swallowing. Nausea and vomiting have improved some over the weekend.   Objective: Vital signs in last 24 hours: Temp:  [98.3 F (36.8 C)-98.5 F (36.9 C)] 98.5 F (36.9 C) (05/19 0334) Pulse Rate:  [77-86] 78 (05/19 0334) Resp:  [18-20] 18 (05/19 0334) BP: (133-164)/(62-66) 133/62 (05/19 0334) SpO2:  [93 %-100 %] 96 % (05/19 0334) Last BM Date : 01/10/24  Intake/Output from previous day: No intake/output data recorded. Intake/Output this shift: No intake/output data recorded.  PE: General: pleasant, WD, WN female who is laying in bed in NAD HEENT: Sclera are anicteric Heart: regular, rate, and rhythm.  Lungs:  Respiratory effort nonlabored Abd: soft, mild ttp in epigastric abdomen with no peritonitis, mild distention  Psych: A&Ox3 with an appropriate affect.   Lab Results:  Recent Labs    01/15/24 0947  WBC 9.1  HGB 10.8*  HCT 32.1*  PLT 453*   BMET Recent Labs    01/15/24 0522 01/15/24 0947  NA 134* 131*  K 4.0 3.9  CL 102 97*  CO2 25 24  GLUCOSE 106* 97  BUN 14 14  CREATININE 1.02* 1.01*  CALCIUM  8.7* 8.7*   PT/INR No results for input(s): "LABPROT", "INR" in the last 72 hours. CMP     Component Value Date/Time   NA 131 (L) 01/15/2024 0947   K 3.9 01/15/2024 0947   CL 97 (L) 01/15/2024 0947   CO2 24 01/15/2024 0947   GLUCOSE 97 01/15/2024 0947   BUN 14 01/15/2024 0947   CREATININE 1.01 (H) 01/15/2024 0947   CALCIUM  8.7 (L) 01/15/2024 0947   PROT 6.6 01/15/2024 0947   ALBUMIN 2.7 (L) 01/15/2024 0947   AST 14 (L) 01/15/2024 0947   ALT 24 01/15/2024 0947   ALKPHOS 90 01/15/2024 0947   BILITOT 0.9 01/15/2024 0947   GFRNONAA 58 (L) 01/15/2024 0947   GFRAA 51 (L) 03/08/2016 1618   Lipase     Component Value Date/Time   LIPASE 52 (H) 01/15/2024 0947       Studies/Results: CT  ABDOMEN PELVIS W CONTRAST Result Date: 01/15/2024 CLINICAL DATA:  Pancreatitis, acute, severe pt with cholecystitis/pancreatitis and prior diverticulitis. worsening periumbilical pain EXAM: CT ABDOMEN AND PELVIS WITH CONTRAST TECHNIQUE: Multidetector CT imaging of the abdomen and pelvis was performed using the standard protocol following bolus administration of intravenous contrast. RADIATION DOSE REDUCTION: This exam was performed according to the departmental dose-optimization program which includes automated exposure control, adjustment of the mA and/or kV according to patient size and/or use of iterative reconstruction technique. CONTRAST:  OMNIPAQUE  IOHEXOL  300 MG/ML  SOLN COMPARISON:  01/11/2024 CT, 01/02/2024 CT, 12/07/2023 ultrasound FINDINGS: Lower chest: Bibasilar subsegmental atelectasis. No acute pleural or parenchymal lung disease. Hepatobiliary: Increased globular high attenuation material within the gallbladder lumen, which could reflect blood products given significant increased since the 01/11/2024 exam and new appearance since 01/02/2024. This could reflect blood clot or tumefactive sludge. The pericholecystic fat stranding noted previously has improved in the interim. No biliary duct dilation. Liver is unremarkable. Pancreas: Progressive inflammatory changes are seen within the head and uncinate process of the pancreas consistent with acute pancreatitis. There is no evidence of fluid collection, pseudocyst, or abscess at this time. No pancreatic duct dilation. Spleen: Normal in size without focal abnormality. Adrenals/Urinary Tract: Kidneys enhance normally and symmetrically. No urinary tract calculi  or obstructive uropathy within either kidney. The adrenals are stable. Bladder is unremarkable. Stomach/Bowel: No bowel obstruction or ileus. Normal appendix right lower quadrant. Diffuse colonic diverticulosis with no evidence of acute diverticulitis. Mild wall thickening of the distal  esophagus at the gastroesophageal junction, which could reflect sequela of reflux or esophagitis. Vascular/Lymphatic: Splenic vein, SMV, and portal vein are patent. Atherosclerosis of the abdominal aorta again noted. No pathologic adenopathy. Reproductive: Persistent fluid-filled endometrial cavity, measuring up to 3 cm in thickness, unchanged in appearance. Gynecologic consultation again recommended to exclude underlying cervical or endometrial cancer. No adnexal masses. Other: No free fluid or free intraperitoneal gas. No abdominal wall hernia. Musculoskeletal: No acute or destructive bony abnormalities. 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 Reconstructed images demonstrate no additional findings. IMPRESSION: 1. Acute pancreatitis, with increased edema and inflammatory changes in the pancreatic head and uncinate process since prior study. No fluid collection, pseudocyst, or abscess. 2. Increasing globular hyperdense material within the gallbladder lumen, differential would include blood clot versus tumefactive sludge. No evidence of acute cholecystitis on this exam. Short interval follow-up ultrasound or CT could be performed. 3. Mild wall thickening of the distal thoracic esophagus at the gastroesophageal junction, new since prior study, which could reflect esophagitis or sequela of reflux/emesis. 4. Diffuse colonic diverticulosis without diverticulitis. 5. Distended fluid-filled endometrial cavity, unchanged since prior CT and ultrasound. Leading diagnostic consideration would be cervical mass or stenosis, and gynecologic follow-up is recommended. 6.  Aortic Atherosclerosis (ICD10-I70.0). Electronically Signed   By: Bobbye Burrow M.D.   On: 01/15/2024 12:52    Anti-infectives: Anti-infectives (From admission, onward)    Start     Dose/Rate Route Frequency Ordered Stop   01/11/24 2200  metroNIDAZOLE  (FLAGYL ) IVPB 500 mg       Placed in "And"  Linked Group   500 mg 100 mL/hr over 60 Minutes Intravenous Every 12 hours 01/11/24 0943     01/11/24 1300  doxycycline  (VIBRA -TABS) tablet 100 mg        100 mg Oral Every 12 hours 01/11/24 0937     01/11/24 1200  cefTRIAXone  (ROCEPHIN ) 2 g in sodium chloride  0.9 % 100 mL IVPB        2 g 200 mL/hr over 30 Minutes Intravenous Daily 01/11/24 1058     01/11/24 0945  cefTRIAXone  (ROCEPHIN ) 2 g in sodium chloride  0.9 % 100 mL IVPB  Status:  Discontinued        2 g 200 mL/hr over 30 Minutes Intravenous  Once 01/11/24 0943 01/11/24 1057   01/11/24 0945  ceFEPIme  (MAXIPIME ) 2 g in sodium chloride  0.9 % 100 mL IVPB  Status:  Discontinued       Placed in "And" Linked Group   2 g 200 mL/hr over 30 Minutes Intravenous Every 24 hours 01/11/24 0943 01/11/24 0944   01/11/24 0630  ceFEPIme  (MAXIPIME ) 2 g in sodium chloride  0.9 % 100 mL IVPB       Placed in "And" Linked Group   2 g 200 mL/hr over 30 Minutes Intravenous  Once 01/11/24 0622 01/11/24 0822   01/11/24 0630  metroNIDAZOLE  (FLAGYL ) IVPB 500 mg       Placed in "And" Linked Group   500 mg 100 mL/hr over 60 Minutes Intravenous  Once 01/11/24 0622 01/11/24 1017        Assessment/Plan  Biliary pancreatitis and probable acute cholecystitis - CT 5/13 with gallstones at the neck of the gallbladder, mild pericholecystic fluid,  possible cholecystitis; enlarged and prominent pancreas with small amount of fluid surrounding the inferior margin of the body of the pancreas; diverticulosis without active diverticulitis.  CT 5/4 with mild diverticulitis at junction of descending and sigmoid colon.   - RUQ US  with mild to moderate gallbladder wall thickening, no gallstones or sludge noted - lipase [id number] on admit   Repeat labs 5/17 were improved with no leukocytosis and lipase continuing to come down.  LFTs normal.  Albumin very low.   Unfortunately, patient did have increased edema and inflammatory changes around the pancreatic head and uncinate process  which was consistent with clinical exam and history.  Patient also had some thickening of the esophagus consistent with some esophagitis.  Pain is significantly improved from last week on exam this AM, however she does still have some mild epigastric pain. Repeating labs this AM. Will discuss with MD if patient appropriate for OR today vs waiting later this week. Keep NPO for now.      FEN: NPO, IVF per [facility] VTE: LMWH ID: rocephin /flagyl    - per [facility] -  HTN HLD T2DM Recent uterine perforation s/p repair 5/8 by Dr. Vallarie Gauze     LOS: 6 days   I reviewed hospitalist notes, last 24 h vitals and pain scores, last 48 h intake and output, last 24 h labs and trends, and last 24 h imaging results.  This care required high  level of medical decision making.    Annetta Killian, [facility] 01/17/2024, 8:21 AM Please see Amion for pager number during day hours 7:00am-4:30pm

## 2024-01-17 NOTE — Anesthesia Postprocedure Evaluation (Signed)
 Anesthesia Post Note  Patient: Alexandria Durham  Procedure(s) Performed: LAPAROSCOPIC CHOLECYSTECTOMY WITH INTRAOPERATIVE CHOLANGIOGRAM DILATION AND CURETTAGE     Patient location during evaluation: PACU Anesthesia Type: General Level of consciousness: sedated and patient cooperative Pain management: pain level controlled Vital Signs Assessment: post-procedure vital signs reviewed and stable Respiratory status: spontaneous breathing Cardiovascular status: stable Anesthetic complications: no   No notable events documented.  Last Vitals:  Vitals:   01/17/24 1535 01/17/24 1601  BP: (!) 184/86 (!) 181/87  Pulse: 76 68  Resp: 16   Temp:  (!) 36.3 C  SpO2: 98% 100%    Last Pain:  Vitals:   01/17/24 1601  TempSrc: Oral  PainSc:                  Gorman Laughter

## 2024-01-17 NOTE — Progress Notes (Signed)
 PROGRESS NOTE    Alexandria Durham  ION:[id number]  DOB: 04-Jun-1946  DOA: 01/11/2024 PCP: Bayard Limbo, NP Outpatient Specialists:   Hospital course:  As per prior documentation: "78 year old F with DM2, HTN, CKD stage III was discharged last week after treatment for uterine perforation s/p D&C.  Patient was admitted for acute gallstone pancreatitis with lipase 6700 and likely acute cholecystitis".  01/15/2024: Patient seen.  No new complaints.  Lipase is down to 52.  Input from GI team is highly appreciated.   Repeat CT abdomen and pelvis with contrast done today, 01/15/2024, revealed: 1. Acute pancreatitis, with increased edema and inflammatory changes in the pancreatic head and uncinate process since prior study. No fluid collection, pseudocyst, or abscess. 2. Increasing globular hyperdense material within the gallbladder lumen, differential would include blood clot versus tumefactive sludge. No evidence of acute cholecystitis on this exam. Short interval follow-up ultrasound or CT could be performed. 3. Mild wall thickening of the distal thoracic esophagus at the gastroesophageal junction, new since prior study, which could reflect esophagitis or sequela of reflux/emesis. 4. Diffuse colonic diverticulosis without diverticulitis. 5. Distended fluid-filled endometrial cavity, unchanged since prior CT and ultrasound. Leading diagnostic consideration would be cervical mass or stenosis, and gynecologic follow-up is recommended. 6.  Aortic Atherosclerosis (ICD10-I70.0).     Electronically Signed   By: Bobbye Burrow M.D.   On: 01/15/2024 12:52   01/16/2024: Patient seen.  Patient continues to report abdominal pain.  Surgical input is appreciated.  Surgery is on hold due to CT abdomen/pelvis findings.  01/17/2024: Patient seen alongside patient's daughter.  Patient underwent laparoscopic cholecystectomy with intraoperative cholangiogram today.  Gallbladder was said to be boggy,  with thickening and edema consistent with acute and chronic cholecystitis.  No new complaints.  Pain seems controlled.  Subjective: Patient seen. No new complaints.  Objective: Vitals:   01/17/24 1535 01/17/24 1601 01/17/24 1805 01/17/24 1813  BP: (!) 184/86 (!) 181/87 (!) 189/81 (!) 189/81  Pulse: 76 68 76   Resp: 16     Temp:  (!) 97.4 F (36.3 C)    TempSrc:  Oral    SpO2: 98% 100%    Weight:      Height:        Intake/Output Summary (Last 24 hours) at 01/17/2024 1857 Last data filed at 01/17/2024 1710 Gross per 24 hour  Intake 1611 ml  Output 20 ml  Net 1591 ml    Filed Weights   01/11/24 1042 01/17/24 0909  Weight: 71 kg 71 kg     Exam:  General: Not in any distress.  Awake and alert. Eyes: No pallor.   CVS: S1-S2 Respiratory: Clear to auscultation. GI: Significant abdominal tenderness. Neuro: Awake and alert.  Data Reviewed:  Basic Metabolic Panel: Recent Labs  Lab 01/13/24 0435 01/14/24 0402 01/15/24 0522 01/15/24 0947 01/17/24 0832  NA 136 131* 134* 131* 132*  K 3.8 2.7* 4.0 3.9 3.6  CL 105 97* 102 97* 94*  CO2 24 25 25 24 25   GLUCOSE 90 84 106* 97 88  BUN 12 14 14 14 14   CREATININE 1.06* 1.03* 1.02* 1.01* 1.17*  CALCIUM  8.3* 8.2* 8.7* 8.7* 9.1  MG  --  1.3*  --   --   --     CBC: Recent Labs  Lab 01/11/24 0356 01/12/24 0642 01/13/24 0621 01/15/24 0947 01/17/24 0832  WBC 12.1* 12.5* 11.5* 9.1 8.1  NEUTROABS  --   --   --  6.7  --  HGB 13.2 11.6* 11.2* 10.8* 11.2*  HCT 40.3 35.3* 34.1* 32.1* 33.3*  MCV 92.4 93.1 91.7 89.9 91.0  PLT 453* 388 392 453* 485*     Scheduled Meds:  acetaminophen   500 mg Oral TID WC & HS   buPROPion   150 mg Oral QPM   Chlorhexidine  Gluconate Cloth  6 each Topical Once   doxycycline   100 mg Oral Q12H   enoxaparin  (LOVENOX ) injection  40 mg Subcutaneous Q24H   hydrochlorothiazide   25 mg Oral Daily   insulin  aspart  0-15 Units Subcutaneous Q4H   levothyroxine   75 mcg Oral Q0600    lipase/protease/amylase  72,000 Units Oral TID AC   pantoprazole  (PROTONIX ) IV  40 mg Intravenous QHS   polycarbophil  625 mg Oral BID   polyethylene glycol  17 g Oral Daily   Continuous Infusions:  cefTRIAXone  (ROCEPHIN )  IV 2 g (01/16/24 0859)   lactated ringers      metronidazole  500 mg (01/16/24 2140)   ondansetron  (ZOFRAN ) IV       Assessment & Plan:   Acute pancreatitis--possibly secondary to gallstones Acute cholecystitis CT reveals stones in the neck of the gallbladder with acute cholecystitis Admission lipase markedly decreased but clinically she remains uncomfortable and did not tolerate advancing of diet yesterday. Being followed by GI and general surgery. General surgery notes that they may consider laparoscopic cholecystectomy on Sunday. She remains on clear liquid diet if she can tolerate. Continue empiric ceftriaxone  and Flagyl  day # 01/15/2024: Repeat CTs as documented above.  GI input is appreciated.  Possible cholecystectomy tomorrow. 01/17/2024: Patient underwent laparoscopic cholecystectomy and intraoperative cholangiogram today.  Acute on chronic cholecystitis noted.  Hypokalemia Resolved. Potassium of 3.6 today.  Suspected endometritis S/p D&C with uterine perforation last week Continue doxycycline  to complete 10-day course per GYN prior to admission  DM2 Continue to optimize.  HTN Continue to optimize.   DVT prophylaxis: Lovenox  Code Status: Full code Family Communication: None today     Studies: DG Cholangiogram Operative Result Date: 01/17/2024 CLINICAL DATA:  Intraoperative cholangiogram. EXAM: INTRAOPERATIVE CHOLANGIOGRAM TECHNIQUE: Cholangiographic images from the C-arm fluoroscopic device were submitted for interpretation post-operatively. Please see the procedural report for the amount of contrast and the fluoroscopy time utilized. FLUOROSCOPY: Radiation Exposure Index (as provided by the fluoroscopic device): 4.41 mGy COMPARISON:   Preoperative imaging FINDINGS: Single cine run provided during intraoperative cholangiogram. Contrast injected into the distal cystic duct. There is opacification of the common bile duct, common hepatic duct and intrahepatic ducts. No visible filling defect or visible choledocholithiasis. Air bubbles are seen in the distal common bile duct. Contrast flows freely into the duodenum. Fluoroscopy time 8 seconds. IMPRESSION: Intraoperative cholangiogram without visible choledocholithiasis. Electronically Signed   By: Chadwick Colonel M.D.   On: 01/17/2024 17:40     Principal Problem:   Acute pancreatitis     Doroteo Gasmen, [facility]  If 7PM-7AM, please contact night-coverage [email or web address]   LOS: 6 days

## 2024-01-17 NOTE — Op Note (Signed)
 GYNECOLOGIC ONCOLOGY OPERATIVE NOTE  Date of Service: 01/11/2024  Preoperative Diagnosis: Thickened endometrium  Postoperative Diagnosis: Same  Procedures: DILATION AND CURETTAGE  Surgeon: Derrel Flies, MD  Assistants: None  Anesthesia: General  Estimated Blood Loss: Minimal  Findings: On bimanual exam, mildly enlarged, globular anteverted uterus. Sounds to approximately 7cm. Predominantly blood with curettage. Smooth walls that took much curetting to reach gritty texture. Pelvis visualized with instrument to fundus without evidence of perforation.  Specimens:  ID Type Source Tests Collected by Time Destination  1 : endometrial curettings Tissue PATH Other SURGICAL PATHOLOGY Derrel Flies, MD 01/17/2024 1328     Complications:  None  Indications for Procedure: Alexandria Durham is a 78 y.o. woman who is admitted for cholecystitis, previously know to thickened endometrium, hematometra. Previously underwent attempt at sampling with another provider but uterine perforation occurred. Given pt was proceeding to OR for surgical management of cholecystitis, recommended attempt at endometrial sampling under laparoscopic guidance.  Prior to the procedure, all risks, benefits, and alternatives were discussed and informed surgical consent was signed.  Procedure: Patient was taken to the operating room where general anesthesia was achieved.  She was positioned in dorsal lithotomy and prepped and draped in sterile fashion.    Laparoscopic entry was performed by Dr. Hershell Lose.  A speculum was placed in the vagina. A tenaculum was placed on the posterior lip of the cervix. The cervix was dilated. The curette was introduced into the uterine fundus. The patient was placed into trendelenburg and the uterus was visualized with no concern for perforation. The curettage was then performed until a gritty texture was achieved. Mostly blood was expelled from the uterus and several passes of the curette was  required to achieve a gritty texture. The tenaculum was removed. Hemostasis was achieved. The speculum was removed and the procedure was continued with Dr. Vann Gent portion. See separate operative note for the remainder of the procedure.  Derrel Flies, MD Gynecologic Oncology

## 2024-01-17 NOTE — TOC Progression Note (Signed)
 Transition of Care [facility] - Progression Note    Patient Details  Name: Alexandria Durham MRN: [id number] Date of Birth: [birth year]  Transition of Care [facility]) CM/SW Contact  Kathryn Parish, RN Phone Number: 01/17/2024, 9:36 AM  Clinical Narrative:    NCM secure chat Dr. Sandria Cruise requesting PT/OT consult. Patient was to start [facility] PT from prior discharge.   Expected Discharge Plan: Home w Home Health Services Barriers to Discharge: Continued Medical Work up  Expected Discharge Plan and Services   Discharge Planning Services: CM Consult Post Acute Care Choice: NA Living arrangements for the past 2 months: Single Family Home                 DME Arranged: N/A DME Agency: NA                   Social Determinants of Health (SDOH) Interventions SDOH Screenings   Food Insecurity: No Food Insecurity (01/11/2024)  Housing: Low Risk  (01/11/2024)  Transportation Needs: No Transportation Needs (01/11/2024)  Utilities: Not At Risk (01/11/2024)  Social Connections: Unknown (01/11/2024)  Tobacco Use: High Risk (01/17/2024)    Readmission Risk Interventions     No data to display

## 2024-01-17 NOTE — Progress Notes (Signed)
 PT Cancellation Note  Patient Details Name: Alexandria Durham MRN: [id number] DOB: November 11, 1945   Cancelled Treatment:    Reason Eval/Treat Not Completed: Patient at procedure or test/unavailable--surgery today. Will check back on tomorrow.    Tanda Falter, PT Acute Rehabilitation  Office: [phone]

## 2024-01-17 NOTE — Progress Notes (Signed)
 OT Cancellation Note  Patient Details Name: Alexandria Durham MRN: [id number] DOB: 10-05-1945   Cancelled Treatment:    Reason Eval/Treat Not Completed: Patient at procedure or test/ unavailable Patient is off hall for surgical intervention. OT to continue to follow and check back on 5/20.  Wynette Heckler, MS Acute Rehabilitation Department Office# [phone]  01/17/2024, 11:58 AM

## 2024-01-17 NOTE — Transfer of Care (Signed)
 Immediate Anesthesia Transfer of Care Note  Patient: Alexandria Durham  Procedure(s) Performed: LAPAROSCOPIC CHOLECYSTECTOMY WITH INTRAOPERATIVE CHOLANGIOGRAM DILATION AND CURETTAGE  Patient Location: PACU  Anesthesia Type:General  Level of Consciousness: awake, alert , oriented, and patient cooperative  Airway & Oxygen Therapy: Patient Spontanous Breathing and Patient connected to face mask oxygen  Post-op Assessment: Report given to RN, Post -op Vital signs reviewed and stable, and Patient moving all extremities  Post vital signs: Reviewed and stable  Last Vitals:  Vitals Value Taken Time  BP    Temp    Pulse    Resp    SpO2      Last Pain:  Vitals:   01/17/24 0919  TempSrc: Oral  PainSc:       Patients Stated Pain Goal: 0 (01/13/24 0732)  Complications: No notable events documented.

## 2024-01-17 NOTE — Op Note (Signed)
 01/17/2024  PATIENT:  Alexandria Durham  78 y.o. female  Patient Care Team: Bayard Limbo, NP as PCP - General (Nurse Practitioner) Candyce Champagne, MD as Consulting Physician (General Surgery) Shelah Derry, MD as Referring Physician (Endocrinology)  PRE-OPERATIVE DIAGNOSIS:    Chronic Calculus cholecystitis Gallstone Pancreatitis  POST-OPERATIVE DIAGNOSIS:   Acute on Chronic Calculus cholecystitis Gallstone Pancreatitis  PROCEDURE:  Laparoscopic cholecystectomy with intraoperative cholangiogram (CPT code 16109)  SURGEON:  Eddye Goodie, MD, FACS.  ASSISTANT:  (n/a)   ANESTHESIA:  General endotracheal intubation anesthesia (GETA) and Local & regional field block at incision(s) for perioperative & postoperative pain control provided with liposomal bupivacaine  (Experel) 20mL mixed with 30mL of bupivicaine 0.25% with epinephrine   Estimated Blood Loss (EBL):   Total I/O In: 1200 [I.V.:1000; IV Piggyback:200] Out: 20 [Blood:20].   (See anesthesia record)  Delay start of Pharmacological VTE agent (>24hrs) due to concerns of significant anemia, surgical blood loss, or risk of bleeding?:  no  DRAINS: (None)  SPECIMEN:  Gallbladder  DISPOSITION OF SPECIMEN:  Pathology  COUNTS:  Sponge, needle, & instrument counts CORRECT at the conclusion of the case.      PLAN OF CARE: Admit to inpatient   PATIENT DISPOSITION:  PACU - hemodynamically stable.  INDICATION: Elderly woman with abdominal pain found to have pancreatitis and stones.  Suspected gallbladder etiology given an underwhelming differential diagnosis.  Significant pain and elevated lipase but gradually improved through hospitalization.  Felt she would benefit from interval cholecystectomy to avoid recurrent episodes of pancreatitis.  The anatomy & physiology of hepatobiliary & pancreatic function was discussed.  The pathophysiology of gallbladder dysfunction was discussed.  Natural history risks without surgery was discussed.    I feel the risks of no intervention will lead to serious problems that outweigh the operative risks; therefore, I recommended cholecystectomy to remove the pathology.  I explained laparoscopic techniques with possible need for an open approach.  Probable cholangiogram to evaluate the bilary tract was explained as well.    Risks such as bleeding, infection, abscess, leak, injury to other organs, need for further treatment, heart attack, death, and other risks were discussed.  I noted a good likelihood this will help address the problem.  Possibility that this will not correct all abdominal symptoms was explained.  Goals of post-operative recovery were discussed as well.  We will work to minimize complications.  An educational handout further explaining the pathology and treatment options was given as well.  Questions were answered.  The patient expresses understanding & wishes to proceed with surgery.  Patient followed by gynecological oncology.  Felt to benefit from dilatation and curettage.  And for combination surgery.  Dr. Daisey Dryer available and discussed with patient.  Please see her separate note and operative report.  OR FINDINGS:   Boggy gallbladder with thickening and edema consistent with acute on chronic cholecystitis.  Cholangiogram with mildly narrowed but normal biliary system.  Some sludge within the common bile duct easily flushed out.  No evidence of any persistent nor obstructing choledocholithiasis.  No obvious stricture or leak  Liver: normal  DESCRIPTION:   Informed consent was confirmed.  The patient underwent general anaesthesia without difficulty.  The patient was positioned appropriately.  VTE prevention in place.  The patient's abdomen was clipped, prepped, & draped in a sterile fashion.  Surgical timeout confirmed our plan.  Peritoneal entry with a laparoscopic port was obtained using optical entry technique in the right upper abdomen as the patient was positioned in  reverse Trendelenburg.  Entry was clean.  I induced carbon dioxide insufflation.  Camera inspection revealed no injury.  Extra ports were carefully placed under direct laparoscopic visualization.  Per Dr. Ona Bidding request, patient was placed in steep Trendelenburg and was able to expose the pelvis and confirm an intact uterus with no evidence of perforation, abscess or hematoma..  Reassuring that there was no breach during the dilatation and curettage.  Patient was repositioned reverse Trendelenburg per standard cholecystectomy positioning.  I turned attention to the right upper quadrant.  The gallbladder fundus was elevated cephalad.  Thickened with edema but no evidence of any empyema or gangrene.  I used hook cautery to free the peritoneal coverings between the gallbladder and the liver on the posteriolateral and anteriomedial walls.   I used careful blunt and hook dissection to help get a good critical view of the cystic artery and cystic duct. I did further dissection to free 50%of the gallbladder off the liver bed to get a good critical view of the infundibulum and cystic duct.  I dissected out the cystic artery; and, after getting a good 360 view, ligated the anterior & posterior branches of the cystic artery close on the infundibulum using the Harmonic ultrasonic dissection.  I skeletonized the cystic duct.  I placed a clip on the infundibulum. I did a partial cystic duct-otomy and ensured patency. I placed a 5 [place] cholangiocatheter through a puncture site at the right subcostal ridge of the abdominal wall and directed it into the cystic duct.  We ran a cholangiogram with dilute radio-opaque contrast and continuous fluoroscopy. Contrast flowed from a side branch consistent with cystic duct cannulization. Contrast flowed up the common hepatic duct into the right and left intrahepatic chains out to secondary radicals.  Some swirls of hyperlucency consistent with some remaining sludge that easily  flushed down the common bile duct easily across the normal ampulla into the duodenum.  No evidence of biliary dilatation, stricture, filling defect, nor contrast extravasation.This was consistent with a normal cholangiogram.  I removed the cholangiocatheter. I placed clips on the cystic duct x4.  I completed cystic duct transection. I freed the gallbladder from its remaining attachments to the liver. I ensured hemostasis on the gallbladder fossa of the liver and elsewhere. I inspected the rest of the abdomen & detected no injury nor bleeding elsewhere.  I placed the gallbladder inside and EcoSac & removed the gallbladder. I closed the extraction port site fascia transversely using #1 PDS interrupted stitches under laparoscopic visualization with the help of a suture passer.. I closed the skin using 4-0 monocryl stitch.  Sterile dressings were applied. The patient was extubated & arrived in the PACU in stable condition..  I had discussed postoperative care with the patient in the holding area.  Do not think she needs extra antibiotics from surgical standpoint.  Hopefully can go home as soon as tomorrow if does well.  I tried to call both the daughter Alexandria Durham and daughter Alexandria Durham after surgery without success at first.  Few minutes later her daughter Alexandria Durham called back.  Updated interoperative findings.  Gave postop recommendations and goals of care.  Questions answered.  She expressed understanding appreciation.   Eddye Goodie, M.D., F.A.C.S. Gastrointestinal and Minimally Invasive Surgery [facility] [address], [address] [place], [place] [address] [phone] Main / Paging  01/17/2024 2:16 PM

## 2024-01-18 ENCOUNTER — Other Ambulatory Visit: Payer: Self-pay

## 2024-01-18 ENCOUNTER — Encounter (HOSPITAL_COMMUNITY): Payer: Self-pay | Admitting: Surgery

## 2024-01-18 DIAGNOSIS — R1010 Upper abdominal pain, unspecified: Secondary | ICD-10-CM | POA: Diagnosis not present

## 2024-01-18 DIAGNOSIS — K851 Biliary acute pancreatitis without necrosis or infection: Secondary | ICD-10-CM | POA: Diagnosis not present

## 2024-01-18 LAB — GLUCOSE, CAPILLARY
Glucose-Capillary: 115 mg/dL — ABNORMAL HIGH (ref 70–99)
Glucose-Capillary: 174 mg/dL — ABNORMAL HIGH (ref 70–99)
Glucose-Capillary: 84 mg/dL (ref 70–99)
Glucose-Capillary: 84 mg/dL (ref 70–99)

## 2024-01-18 LAB — COMPREHENSIVE METABOLIC PANEL WITH GFR
ALT: 22 U/L (ref 0–44)
AST: 36 U/L (ref 15–41)
Albumin: 2.6 g/dL — ABNORMAL LOW (ref 3.5–5.0)
Alkaline Phosphatase: 70 U/L (ref 38–126)
Anion gap: 11 (ref 5–15)
BUN: 13 mg/dL (ref 8–23)
CO2: 23 mmol/L (ref 22–32)
Calcium: 8.8 mg/dL — ABNORMAL LOW (ref 8.9–10.3)
Chloride: 99 mmol/L (ref 98–111)
Creatinine, Ser: 1.12 mg/dL — ABNORMAL HIGH (ref 0.44–1.00)
GFR, Estimated: 51 mL/min — ABNORMAL LOW (ref >60)
Glucose, Bld: 88 mg/dL (ref 70–99)
Potassium: 3.3 mmol/L — ABNORMAL LOW (ref 3.5–5.1)
Sodium: 133 mmol/L — ABNORMAL LOW (ref 135–145)
Total Bilirubin: 0.5 mg/dL (ref 0.0–1.2)
Total Protein: 6.1 g/dL — ABNORMAL LOW (ref 6.5–8.1)

## 2024-01-18 LAB — CBC
HCT: 31.9 % — ABNORMAL LOW (ref 36.0–46.0)
Hemoglobin: 10.7 g/dL — ABNORMAL LOW (ref 12.0–15.0)
MCH: 30.4 pg (ref 26.0–34.0)
MCHC: 33.5 g/dL (ref 30.0–36.0)
MCV: 90.6 fL (ref 80.0–100.0)
Platelets: 453 10*3/uL — ABNORMAL HIGH (ref 150–400)
RBC: 3.52 MIL/uL — ABNORMAL LOW (ref 3.87–5.11)
RDW: 14.2 % (ref 11.5–15.5)
WBC: 7.3 10*3/uL (ref 4.0–10.5)
nRBC: 0 % (ref 0.0–0.2)

## 2024-01-18 LAB — SURGICAL PATHOLOGY

## 2024-01-18 LAB — LIPASE, BLOOD: Lipase: 61 U/L — ABNORMAL HIGH (ref 11–51)

## 2024-01-18 MED ORDER — POTASSIUM CHLORIDE CRYS ER 20 MEQ PO TBCR
40.0000 meq | EXTENDED_RELEASE_TABLET | Freq: Once | ORAL | Status: AC
  Administered 2024-01-18: 40 meq via ORAL
  Filled 2024-01-18: qty 2

## 2024-01-18 MED ORDER — SENNOSIDES-DOCUSATE SODIUM 8.6-50 MG PO TABS
1.0000 | ORAL_TABLET | Freq: Two times a day (BID) | ORAL | Status: DC
  Administered 2024-01-18 – 2024-01-21 (×6): 1 via ORAL
  Filled 2024-01-18 (×6): qty 1

## 2024-01-18 MED ORDER — GABAPENTIN 300 MG PO CAPS
600.0000 mg | ORAL_CAPSULE | Freq: Two times a day (BID) | ORAL | Status: DC
  Administered 2024-01-18 – 2024-01-21 (×7): 600 mg via ORAL
  Filled 2024-01-18 (×7): qty 2

## 2024-01-18 MED ORDER — ACETAMINOPHEN 500 MG PO TABS
1000.0000 mg | ORAL_TABLET | Freq: Three times a day (TID) | ORAL | Status: DC
  Administered 2024-01-18 – 2024-01-21 (×14): 1000 mg via ORAL
  Filled 2024-01-18 (×14): qty 2

## 2024-01-18 MED ORDER — CYCLOBENZAPRINE HCL 5 MG PO TABS
5.0000 mg | ORAL_TABLET | Freq: Three times a day (TID) | ORAL | Status: DC
  Administered 2024-01-18 – 2024-01-21 (×11): 5 mg via ORAL
  Filled 2024-01-18 (×11): qty 1

## 2024-01-18 MED ORDER — TRAMADOL HCL 50 MG PO TABS
50.0000 mg | ORAL_TABLET | Freq: Four times a day (QID) | ORAL | Status: DC | PRN
  Administered 2024-01-18: 50 mg via ORAL
  Filled 2024-01-18: qty 1

## 2024-01-18 MED ORDER — LIDOCAINE 5 % EX PTCH
2.0000 | MEDICATED_PATCH | CUTANEOUS | Status: DC
  Administered 2024-01-18 – 2024-01-21 (×4): 2 via TRANSDERMAL
  Filled 2024-01-18 (×5): qty 2

## 2024-01-18 MED ORDER — PANTOPRAZOLE SODIUM 40 MG PO TBEC
40.0000 mg | DELAYED_RELEASE_TABLET | Freq: Every day | ORAL | Status: DC
  Administered 2024-01-18 – 2024-01-21 (×4): 40 mg via ORAL
  Filled 2024-01-18 (×4): qty 1

## 2024-01-18 NOTE — Plan of Care (Signed)
  Problem: Education: Goal: Ability to describe self-care measures that may prevent or decrease complications (Diabetes Survival Skills Education) will improve Outcome: Progressing   Problem: Education: Goal: Knowledge of General Education information will improve Description: Including pain rating scale, medication(s)/side effects and non-pharmacologic comfort measures Outcome: Progressing   Problem: Pain Managment: Goal: General experience of comfort will improve and/or be controlled Outcome: Progressing   Problem: Safety: Goal: Ability to remain free from injury will improve Outcome: Progressing   Problem: Skin Integrity: Goal: Risk for impaired skin integrity will decrease Outcome: Progressing

## 2024-01-18 NOTE — Progress Notes (Signed)
 GYN Oncology Progress Note  78 year old s/p D&C on 01/17/2024 for thickened endometrium at the same time as lap chole with Dr. Hershell Lose. Patient is sitting on the side of the bed sipping on liquids.  She reports intermittent dry heaving with no emesis.  She reports having moderate upper abdominal pain overnight and this morning.  No flatus reported. No needs voiced at this time. Continue with current plan of care per Dr. Hershell Lose. Patient advised she would be notified when pathology from endometrial sampling returns.

## 2024-01-18 NOTE — Addendum Note (Signed)
 Addendum  created 01/18/24 2013 by Leslye Rast, MD   Intraprocedure Staff edited

## 2024-01-18 NOTE — Evaluation (Signed)
 Occupational Therapy Evaluation Patient Details Name: Alexandria Durham MRN: [id number] DOB: Sep 12, 1945 Today's Date: 01/18/2024   History of Present Illness   Patient is a 78 year old female who presented on 5/13 with abdominal pain, emesis and nausea. CT revealed acute cholecystitis and pancreatitis.   Patient underwent laparoscopic cholecystectomy with intraoperative cholangiogram on 5/19. PMH: D&C hysteroscopy 01/06/24, DM II, HTN, CKD III.     Clinical Impressions Patient is a 78 year old female who was admitted for above. Patient was living at home with daughter prior level. Currently, patient was minA  for bed mobility and functional mobility around room with TD for LB Dressing/bathing tasks. Patient was noted to have decreased functional activity tolerance, decreased endurance, decreased standing balance, decreased safety awareness, and decreased knowledge of AD/AE impacting participation in ADLs. Patient plans to d/c home with daughter support and [facility] services when medically stable.      If plan is discharge home, recommend the following:   A lot of help with bathing/dressing/bathroom;A little help with walking and/or transfers;Direct supervision/assist for medications management;Assist for transportation;Assistance with cooking/housework;Direct supervision/assist for financial management;Help with stairs or ramp for entrance     Functional Status Assessment   Patient has had a recent decline in their functional status and demonstrates the ability to make significant improvements in function in a reasonable and predictable amount of time.     Equipment Recommendations   None recommended by OT      Precautions/Restrictions   Precautions Precautions: Fall Precaution/Restrictions Comments: multiple  small abd incisions, pt. reported bleeding from most caudal incision Restrictions Weight Bearing Restrictions Per Provider Order: No     Mobility Bed Mobility Overal bed  mobility: Needs Assistance Bed Mobility: Supine to Sit, Sit to Supine, Rolling Rolling: Supervision, Used rails   Supine to sit: Min assist, Used rails Sit to supine: Min assist   General bed mobility comments: increased time and effort due to pain, assist with trunk to sit upright and  for legs back to sidelying            Balance Overall balance assessment: Mild deficits observed, not formally tested             ADL either performed or assessed with clinical judgement   ADL Overall ADL's : Needs assistance/impaired Eating/Feeding: Sitting;Set up Eating/Feeding Details (indicate cue type and reason): small sip of water. Grooming: Sitting;Set up   Upper Body Bathing: Sitting;Moderate assistance   Lower Body Bathing: Sitting/lateral leans;Total assistance   Upper Body Dressing : Sitting;Moderate assistance   Lower Body Dressing: Sitting/lateral leans;Maximal assistance   Toilet Transfer: Minimal assistance;Ambulation;Rolling walker (2 wheels) Toilet Transfer Details (indicate cue type and reason): from one side of bed and back with min A Toileting- Clothing Manipulation and Hygiene: Maximal assistance;Sit to/from stand               Vision   Vision Assessment?: No apparent visual deficits            Pertinent Vitals/Pain Pain Assessment Pain Assessment: Faces Faces Pain Scale: Hurts whole lot Pain Location: abdomen Pain Descriptors / Indicators: Sore, Grimacing, Guarding Pain Intervention(s): Limited activity within patient's tolerance, Monitored during session, Premedicated before session, Repositioned     Extremity/Trunk Assessment Upper Extremity Assessment Upper Extremity Assessment: RUE deficits/detail RUE Deficits / Details: pt reports chronic R shoulder strength and ROM issues as pt reports she needs a R shoulder surgery RUE: Unable to fully assess due to pain;Shoulder pain with ROM   Lower  Extremity Assessment Lower Extremity Assessment:  Defer to PT evaluation   Cervical / Trunk Assessment Cervical / Trunk Assessment: Normal   Communication Communication Communication: No apparent difficulties   Cognition Arousal: Alert Behavior During Therapy: Flat affect Cognition: Difficult to assess             OT - Cognition Comments: patients current pain level effecting ammout of talking patient completed. patient with abdominal pain with breathing                 Following commands: Intact                  Home Living Family/patient expects to be discharged to:: Private residence Living Arrangements: Children Available Help at Discharge: Family;Available 24 hours/day Type of Home: House Home Access: Stairs to enter [employer] of Steps: 6-8 Entrance Stairs-Rails: Right Home Layout: Multi-level Alternate Level Stairs-Number of Steps: 1 step up to laundry and kitchen   Bathroom Shower/Tub: Tub/shower unit         Home Equipment: Cane - single Librarian, academic (2 wheels)   Additional Comments: pt plans to d/c home with her daughter (as above)      Prior Functioning/Environment               Mobility Comments: was ambulating  w/ RW at last DC from [facility] on 5/11 ADLs Comments: dtr assisting since DC fr. [facility]    OT Problem List: Impaired balance (sitting and/or standing);Decreased knowledge of precautions;Decreased safety awareness;Decreased knowledge of use of DME or AE;Decreased activity tolerance   OT Treatment/Interventions: Self-care/ADL training;DME and/or AE instruction;Therapeutic activities;Balance training;Energy conservation;Patient/family education      OT Goals(Current goals can be found in the care plan section)   Acute Rehab OT Goals Patient Stated Goal: to get pain under control OT Goal Formulation: With patient Time For Goal Achievement: 02/01/24 Potential to Achieve Goals: Fair   OT Frequency:  Min 2X/week    Co-evaluation PT/OT/SLP  Co-Evaluation/Treatment: Yes            AM-PAC OT "6 Clicks" Daily Activity     Outcome Measure Help from another person eating meals?: A Little Help from another person taking care of personal grooming?: A Little Help from another person toileting, which includes using toliet, bedpan, or urinal?: A Lot Help from another person bathing (including washing, rinsing, drying)?: A Lot Help from another person to put on and taking off regular upper body clothing?: A Little Help from another person to put on and taking off regular lower body clothing?: A Lot 6 Click Score: 15   End of Session Equipment Utilized During Treatment: Rolling walker (2 wheels)  Activity Tolerance: Patient limited by pain Patient left: in bed;with call bell/phone within reach  OT Visit Diagnosis: Unsteadiness on feet (R26.81);Other abnormalities of gait and mobility (R26.89);Pain                Time: 1914-7829 OT Time Calculation (min): 19 min Charges:  OT General Charges $OT Visit: 1 Visit OT Evaluation $OT Eval Moderate Complexity: 1 Mod  Wadie Liew OTR/L, MS Acute Rehabilitation Department Office# [phone]   Jame Maze 01/18/2024, 12:20 PM

## 2024-01-18 NOTE — Evaluation (Signed)
 Physical Therapy Evaluation Patient Details Name: Alexandria Durham MRN: [id number] DOB: March 21, 1946 Today's Date: 01/18/2024  History of Present Illness  "78 yo female   who presented on 5/13 with abdominal pain and n/v, pancreatitis.  S/P lap chelecystectomy and /d&C on 01/17/24. PMH: stage 3a CKD, DM, HTN, HLD, and endometrial abnormality s/p recent D and C 5/8 with uterine perforation  Clinical Impression  Pt admitted with above diagnosis.  Pt currently with functional limitations due to the deficits listed below (see PT Problem List). Pt will benefit from acute skilled PT to increase their independence and safety with mobility to allow discharge.     The patient  repoting abdominal pain with mobility. Patient given  pain medication. Patient ambulated x 30' using the RW. Patient should progress to return home with daughter.       If plan is discharge home, recommend the following: Help with stairs or ramp for entrance;Assistance with cooking/housework;A little help with bathing/dressing/bathroom   Can travel by private vehicle        Equipment Recommendations None recommended by PT  Recommendations for Other Services       Functional Status Assessment Patient has had a recent decline in their functional status and demonstrates the ability to make significant improvements in function in a reasonable and predictable amount of time.     Precautions / Restrictions Precautions Precaution/Restrictions Comments: multiple  small abd incisions, pt. reported bleeding from most caudal incision Restrictions Weight Bearing Restrictions Per Provider Order: No      Mobility  Bed Mobility   Bed Mobility: Supine to Sit, Sit to Supine, Rolling Rolling: Supervision, Used rails   Supine to sit: Min assist, Used rails Sit to supine: Min assist   General bed mobility comments: increased time and effort due to pain, assist with trunk to sit upright and  for legs back to sidelying     Transfers Overall transfer level: Needs assistance Equipment used: Rolling walker (2 wheels) Transfers: Sit to/from Stand Sit to Stand: Min assist                Ambulation/Gait Ambulation/Gait assistance: Editor, commissioning (Feet): 30 Feet   Gait Pattern/deviations: Step-through pattern, Decreased stride length, Decreased stance time - left, Antalgic, Trunk flexed Gait velocity: decr     General Gait Details: slow  speed, guarded with turning  Stairs            Wheelchair Mobility     Tilt Bed    Modified Rankin (Stroke Patients Only)       Balance Overall balance assessment: Mild deficits observed, not formally tested                                           Pertinent Vitals/Pain Pain Assessment Pain Assessment: Faces Faces Pain Scale: Hurts whole lot Pain Location: abdomen Pain Descriptors / Indicators: Sore, Grimacing, Guarding Pain Intervention(s): Limited activity within patient's tolerance, Monitored during session, Premedicated before session, Repositioned    Home Living Family/patient expects to be discharged to:: Private residence Living Arrangements: Children Available Help at Discharge: Family;Available 24 hours/day Type of Home: House Home Access: Stairs to enter Entrance Stairs-Rails: Right Entrance Stairs-Number of Steps: 6-8 Alternate Level Stairs-Number of Steps: 1 step up to laundry and kitchen Home Layout: Multi-level Home Equipment: Cane - single Librarian, academic (2 wheels) Additional Comments: pt plans to d/c home  with her daughter (as above)    Prior Function Prior Level of Function : Independent/Modified Independent             Mobility Comments: was ambulating  w/ RW at last DC from [facility] on 5/11 ADLs Comments: dtr assisting since DC fr. [facility]     Extremity/Trunk Assessment        Lower Extremity Assessment Lower Extremity Assessment: Generalized weakness    Cervical / Trunk  Assessment Cervical / Trunk Assessment: Normal  Communication   Communication Communication: No apparent difficulties    Cognition Arousal: Alert Behavior During Therapy: Flat affect   PT - Cognitive impairments: No apparent impairments                         Following commands: Intact       Cueing       General Comments      Exercises     Assessment/Plan    PT Assessment Patient needs continued PT services  PT Problem List Decreased strength;Decreased activity tolerance;Decreased mobility;Decreased knowledge of use of DME       PT Treatment Interventions DME instruction;Gait training;Balance training;Stair training;Functional mobility training;Patient/family education;Therapeutic activities;Therapeutic exercise    PT Goals (Current goals can be found in the Care Plan section)  Acute Rehab PT Goals Patient Stated Goal: agreed to ambulate PT Goal Formulation: With patient Time For Goal Achievement: 02/01/24 Potential to Achieve Goals: Good    Frequency Min 3X/week     Co-evaluation               AM-PAC PT "6 Clicks" Mobility  Outcome Measure Help needed turning from your back to your side while in a flat bed without using bedrails?: A Little Help needed moving from lying on your back to sitting on the side of a flat bed without using bedrails?: A Little Help needed moving to and from a bed to a chair (including a wheelchair)?: A Little Help needed standing up from a chair using your arms (e.g., wheelchair or bedside chair)?: A Little Help needed to walk in hospital room?: A Little Help needed climbing 3-5 steps with a railing? : A Lot 6 Click Score: 17    End of Session   Activity Tolerance: Patient limited by pain Patient left: in bed;with call bell/phone within reach;with bed alarm set Nurse Communication: Mobility status PT Visit Diagnosis: Difficulty in walking, not elsewhere classified (R26.2)    Time: 5409-8119 PT Time  Calculation (min) (ACUTE ONLY): 20 min   Charges:   PT Evaluation $PT Eval Low Complexity: 1 Low   PT General Charges $$ ACUTE PT VISIT: 1 Visit         Abelina Hoes PT Acute Rehabilitation Services Office [phone]    Dareen Ebbing 01/18/2024, 9:30 AM

## 2024-01-18 NOTE — TOC Progression Note (Signed)
 Transition of Care [facility]) - Progression Note    Patient Details  Name: Alexandria Durham MRN: [id number] Date of Birth: 08-24-1946  [facility]) CM/SW Contact  Kathryn Parish, RN Phone Number: 01/18/2024, 12:48 PM  Clinical Narrative:     Patient is still not medically ready for discharge.  Expected Discharge Plan: Home w Home Health Services Barriers to Discharge: Continued Medical Work up  Expected Discharge Plan and Services   Discharge Planning Services: CM Consult Post Acute Care Choice: NA Living arrangements for the past 2 months: Single Family Home                 DME Arranged: N/A DME Agency: NA                   Social Determinants of Health (SDOH) Interventions SDOH Screenings   Food Insecurity: No Food Insecurity (01/11/2024)  Housing: Low Risk  (01/11/2024)  Transportation Needs: No Transportation Needs (01/11/2024)  Utilities: Not At Risk (01/11/2024)  Social Connections: Unknown (01/11/2024)  Tobacco Use: High Risk (01/17/2024)    Readmission Risk Interventions     No data to display

## 2024-01-18 NOTE — Progress Notes (Signed)
 [name]                                                                              Alexandria Durham, is a 78 y.o. female, DOB - April 18, 1946, NGE:[id number] Admit date - 01/11/2024    Outpatient Primary MD for the patient is Bayard Limbo, NP  LOS - 7  days  Chief Complaint  Patient presents with   Abdominal Pain       Brief summary   Patient is a 78 year old female with DM type II, HTN, CKD stage III who was discharged last week after treatment for uterine perforation status post D&C.  She was admitted again with acute gallstone pancreatitis with lipase 6700 and likely acute cholecystitis.   Assessment & Plan      Acute pancreatitis--possibly secondary to gallstones Acute cholecystitis - CT reveals stones in the neck of the gallbladder with acute cholecystitis, lipase on admission markedly elevated 6700 -GI and general surgery was consulted. -Repeat CT abdomen on 5/17 showed acute pancreatitis with increased edema and inflammatory changes in the pancreatic head and uncinate process, no fluid collection pseudocyst or abscess.  Mild wall thickening of the distal thoracic esophagus at the GE junction, new could reflect esophagitis -Underwent laparoscopic cholecystectomy and intraoperative cholangiogram on 5/19, noted acute on chronic cholecystitis, postop day #1 - Tolerating clear liquid diet, surgery following, increased to soft solids - No flatus or BM yet per patient, added stool softeners, continue Senokot-S, MiraLAX , Dulcolax pulsatory  Suspected endometritis - Status post D&C on 01/17/2024 for thickened endometrium at the same time as lap chole with Dr. Hershell Lose, postop day #1 -GynOnc following, pathology pending -Continue doxycycline  to complete 10-day course per previous GYN recommendations   Hypokalemia K3.3, replaced      Diabetes mellitus type 2, NIDDM --hold glipizide, Januvia -Continue sliding scale insulin , moderate  CBG (last 3)  Recent Labs     01/18/24 0406 01/18/24 0733 01/18/24 1211  GLUCAP 84 84 174*      HTN BP stable    Estimated body mass index is 26.87 kg/m as calculated from the following:   Height as of this encounter: 5\' 4"  (1.626 m).   Weight as of this encounter: 71 kg.  Code Status: Full code DVT Prophylaxis:  enoxaparin  (LOVENOX ) injection 40 mg Start: 01/11/24 1200   Level of Care: Level of care: Med-Surg Family Communication: Updated patient Disposition Plan:      Remains inpatient appropriate: Per surgery, possible DC tomorrow if continues to improve   Procedures:   laparoscopic cholecystectomy and intraoperative cholangiogram on 5/19 D&C  Consultants:   General Surgery Gyn Onc  Antimicrobials:   Anti-infectives (From admission, onward)    Start     Dose/Rate Route Frequency Ordered Stop   01/17/24 1215  cefTRIAXone  (ROCEPHIN ) 2 g in sodium chloride  0.9 % 100 mL IVPB  Status:  Discontinued        2 g 200 mL/hr over 30 Minutes Intravenous On call to O.R. 01/17/24 1203 01/17/24 1545   01/11/24 2200  metroNIDAZOLE  (FLAGYL ) IVPB 500 mg  Status:  Discontinued       Placed in "And" Linked Group  500 mg 100 mL/hr over 60 Minutes Intravenous Every 12 hours 01/11/24 0943 01/18/24 1158   01/11/24 1300  doxycycline  (VIBRA -TABS) tablet 100 mg        100 mg Oral Every 12 hours 01/11/24 0937     01/11/24 1200  cefTRIAXone  (ROCEPHIN ) 2 g in sodium chloride  0.9 % 100 mL IVPB  Status:  Discontinued        2 g 200 mL/hr over 30 Minutes Intravenous Daily 01/11/24 1058 01/18/24 1158   01/11/24 0945  cefTRIAXone  (ROCEPHIN ) 2 g in sodium chloride  0.9 % 100 mL IVPB  Status:  Discontinued        2 g 200 mL/hr over 30 Minutes Intravenous  Once 01/11/24 0943 01/11/24 1057   01/11/24 0945  ceFEPIme  (MAXIPIME ) 2 g in sodium chloride  0.9 % 100 mL IVPB  Status:  Discontinued       Placed in "And" Linked Group   2 g 200 mL/hr over 30 Minutes Intravenous Every 24 hours 01/11/24 0943 01/11/24 0944   01/11/24  0630  ceFEPIme  (MAXIPIME ) 2 g in sodium chloride  0.9 % 100 mL IVPB       Placed in "And" Linked Group   2 g 200 mL/hr over 30 Minutes Intravenous  Once 01/11/24 0622 01/11/24 0822   01/11/24 0630  metroNIDAZOLE  (FLAGYL ) IVPB 500 mg       Placed in "And" Linked Group   500 mg 100 mL/hr over 60 Minutes Intravenous  Once 01/11/24 0622 01/11/24 1017          Medications  acetaminophen   1,000 mg Oral TID WC & HS   buPROPion   150 mg Oral QPM   Chlorhexidine  Gluconate Cloth  6 each Topical Once   cyclobenzaprine   5 mg Oral TID   doxycycline   100 mg Oral Q12H   enoxaparin  (LOVENOX ) injection  40 mg Subcutaneous Q24H   gabapentin   600 mg Oral BID   hydrochlorothiazide   25 mg Oral Daily   insulin  aspart  0-15 Units Subcutaneous Q4H   levothyroxine   75 mcg Oral Q0600   lidocaine   2 patch Transdermal Q24H   lipase/protease/amylase  72,000 Units Oral TID AC   pantoprazole   40 mg Oral Q1200   polycarbophil  625 mg Oral BID   polyethylene glycol  17 g Oral Daily      Subjective:   Briseyda Fehr was seen and examined today.  Per patient, no flatus or BM yet, having no bleeding or oozing.  No nausea or vomiting.  Very weak   Objective:   Vitals:   01/17/24 2158 01/17/24 2355 01/18/24 0410 01/18/24 1214  BP: (!) 167/77 (!) 152/70 (!) 119/42 (!) 143/68  Pulse: 84 74 76 84  Resp: 20 18 16 18   Temp: 98.6 F (37 C) 98.5 F (36.9 C) 98 F (36.7 C) 98 F (36.7 C)  TempSrc:    Oral  SpO2: 99% 99% 96% 100%  Weight:      Height:        Intake/Output Summary (Last 24 hours) at 01/18/2024 1541 Last data filed at 01/18/2024 0900 Gross per 24 hour  Intake 1463.67 ml  Output --  Net 1463.67 ml     Wt Readings from Last 3 Encounters:  01/17/24 71 kg  01/06/24 71.3 kg  05/25/21 73 kg     Exam General: Alert and oriented x 3, NAD Cardiovascular: S1 S2 auscultated,  RRR Respiratory: Clear to auscultation bilaterally Gastrointestinal: Appropriately tender, incisions CDI,  hypoactive BS Ext: no pedal edema  bilaterally Neuro: Strength 5/5 upper and lower extremities bilaterally Psych: Normal affect     Data Reviewed:  I have personally reviewed following labs    CBC Lab Results  Component Value Date   WBC 7.3 01/18/2024   RBC 3.52 (L) 01/18/2024   HGB 10.7 (L) 01/18/2024   HCT 31.9 (L) 01/18/2024   MCV 90.6 01/18/2024   MCH 30.4 01/18/2024   PLT 453 (H) 01/18/2024   MCHC 33.5 01/18/2024   RDW 14.2 01/18/2024   LYMPHSABS 1.5 01/15/2024   MONOABS 0.8 01/15/2024   EOSABS 0.1 01/15/2024   BASOSABS 0.0 01/15/2024     Last metabolic panel Lab Results  Component Value Date   NA 133 (L) 01/18/2024   K 3.3 (L) 01/18/2024   CL 99 01/18/2024   CO2 23 01/18/2024   BUN 13 01/18/2024   CREATININE 1.12 (H) 01/18/2024   GLUCOSE 88 01/18/2024   GFRNONAA 51 (L) 01/18/2024   GFRAA 51 (L) 03/08/2016   CALCIUM  8.8 (L) 01/18/2024   PHOS 2.5 01/04/2024   PROT 6.1 (L) 01/18/2024   ALBUMIN 2.6 (L) 01/18/2024   BILITOT 0.5 01/18/2024   ALKPHOS 70 01/18/2024   AST 36 01/18/2024   ALT 22 01/18/2024   ANIONGAP 11 01/18/2024    CBG (last 3)  Recent Labs    01/18/24 0406 01/18/24 0733 01/18/24 1211  GLUCAP 84 84 174*      Coagulation Profile: No results for input(s): "INR", "PROTIME" in the last 168 hours.   Radiology Studies: I have personally reviewed the imaging studies  DG Cholangiogram Operative Result Date: 01/17/2024 CLINICAL DATA:  Intraoperative cholangiogram. EXAM: INTRAOPERATIVE CHOLANGIOGRAM TECHNIQUE: Cholangiographic images from the C-arm fluoroscopic device were submitted for interpretation post-operatively. Please see the procedural report for the amount of contrast and the fluoroscopy time utilized. FLUOROSCOPY: Radiation Exposure Index (as provided by the fluoroscopic device): 4.41 mGy COMPARISON:  Preoperative imaging FINDINGS: Single cine run provided during intraoperative cholangiogram. Contrast injected into the distal cystic  duct. There is opacification of the common bile duct, common hepatic duct and intrahepatic ducts. No visible filling defect or visible choledocholithiasis. Air bubbles are seen in the distal common bile duct. Contrast flows freely into the duodenum. Fluoroscopy time 8 seconds. IMPRESSION: Intraoperative cholangiogram without visible choledocholithiasis. Electronically Signed   By: Chadwick Colonel M.D.   On: 01/17/2024 17:40       Terriah Reggio M.D. [name] 01/18/2024, 3:41 PM  Available via Epic secure chat 7am-7pm After 7 pm, please refer to night coverage provider listed on amion.

## 2024-01-18 NOTE — Progress Notes (Signed)
 [facility] Progress Note  1 Day Post-Op  Subjective: CC: says she "bled out" from her abdominal incision yesterday Reports abdominal pain that is different than the pain from pancreatitis - worse with breathing and movement. Denies nausea or vomiting. She does say that when she drinks liquids she has epigastric pain. Denies flatus or BM yet. Voiding without reported symptoms.   Objective: Vital signs in last 24 hours: Temp:  [97.4 F (36.3 C)-98.6 F (37 C)] 98 F (36.7 C) (05/20 0410) Pulse Rate:  [67-85] 76 (05/20 0410) Resp:  [11-22] 16 (05/20 0410) BP: (119-212)/(42-91) 119/42 (05/20 0410) SpO2:  [92 %-100 %] 96 % (05/20 0410) Weight:  [71 kg] 71 kg (05/19 0909) Last BM Date : 01/10/24  Intake/Output from previous day: 05/19 0701 - 05/20 0700 In: 1611 [I.V.:1411; IV Piggyback:200] Out: 20 [Blood:20] Intake/Output this shift: Total I/O In: 983.7 [IV Piggyback:983.7] Out: -   PE: Gen:  Alert, NAD, cooperative Card:  Regular rate and rhythm Pulm:  Normal effort ORA Abd: Soft, appropriately tender, incisions c/d/I with tegaderm, no peritonitis.  Skin: warm and dry, no rashes  Psych: A&Ox3   Lab Results:  Recent Labs    01/17/24 0832 01/18/24 0442  WBC 8.1 7.3  HGB 11.2* 10.7*  HCT 33.3* 31.9*  PLT 485* 453*   BMET Recent Labs    01/17/24 0832 01/18/24 0442  NA 132* 133*  K 3.6 3.3*  CL 94* 99  CO2 25 23  GLUCOSE 88 88  BUN 14 13  CREATININE 1.17* 1.12*  CALCIUM  9.1 8.8*   PT/INR No results for input(s): "LABPROT", "INR" in the last 72 hours. CMP     Component Value Date/Time   NA 133 (L) 01/18/2024 0442   K 3.3 (L) 01/18/2024 0442   CL 99 01/18/2024 0442   CO2 23 01/18/2024 0442   GLUCOSE 88 01/18/2024 0442   BUN 13 01/18/2024 0442   CREATININE 1.12 (H) 01/18/2024 0442   CALCIUM  8.8 (L) 01/18/2024 0442   PROT 6.1 (L) 01/18/2024 0442   ALBUMIN 2.6 (L) 01/18/2024 0442   AST 36 01/18/2024 0442   ALT 22 01/18/2024 0442   ALKPHOS  70 01/18/2024 0442   BILITOT 0.5 01/18/2024 0442   GFRNONAA 51 (L) 01/18/2024 0442   GFRAA 51 (L) 03/08/2016 1618   Lipase     Component Value Date/Time   LIPASE 61 (H) 01/18/2024 0442       Studies/Results: DG Cholangiogram Operative Result Date: 01/17/2024 CLINICAL DATA:  Intraoperative cholangiogram. EXAM: INTRAOPERATIVE CHOLANGIOGRAM TECHNIQUE: Cholangiographic images from the C-arm fluoroscopic device were submitted for interpretation post-operatively. Please see the procedural report for the amount of contrast and the fluoroscopy time utilized. FLUOROSCOPY: Radiation Exposure Index (as provided by the fluoroscopic device): 4.41 mGy COMPARISON:  Preoperative imaging FINDINGS: Single cine run provided during intraoperative cholangiogram. Contrast injected into the distal cystic duct. There is opacification of the common bile duct, common hepatic duct and intrahepatic ducts. No visible filling defect or visible choledocholithiasis. Air bubbles are seen in the distal common bile duct. Contrast flows freely into the duodenum. Fluoroscopy time 8 seconds. IMPRESSION: Intraoperative cholangiogram without visible choledocholithiasis. Electronically Signed   By: Chadwick Colonel M.D.   On: 01/17/2024 17:40    Anti-infectives: Anti-infectives (From admission, onward)    Start     Dose/Rate Route Frequency Ordered Stop   01/17/24 1215  cefTRIAXone  (ROCEPHIN ) 2 g in sodium chloride  0.9 % 100 mL IVPB  Status:  Discontinued  2 g 200 mL/hr over 30 Minutes Intravenous On call to O.R. 01/17/24 1203 01/17/24 1545   01/11/24 2200  metroNIDAZOLE  (FLAGYL ) IVPB 500 mg       Placed in "And" Linked Group   500 mg 100 mL/hr over 60 Minutes Intravenous Every 12 hours 01/11/24 0943     01/11/24 1300  doxycycline  (VIBRA -TABS) tablet 100 mg        100 mg Oral Every 12 hours 01/11/24 0937     01/11/24 1200  cefTRIAXone  (ROCEPHIN ) 2 g in sodium chloride  0.9 % 100 mL IVPB        2 g 200 mL/hr over 30  Minutes Intravenous Daily 01/11/24 1058     01/11/24 0945  cefTRIAXone  (ROCEPHIN ) 2 g in sodium chloride  0.9 % 100 mL IVPB  Status:  Discontinued        2 g 200 mL/hr over 30 Minutes Intravenous  Once 01/11/24 0943 01/11/24 1057   01/11/24 0945  ceFEPIme  (MAXIPIME ) 2 g in sodium chloride  0.9 % 100 mL IVPB  Status:  Discontinued       Placed in "And" Linked Group   2 g 200 mL/hr over 30 Minutes Intravenous Every 24 hours 01/11/24 0943 01/11/24 0944   01/11/24 0630  ceFEPIme  (MAXIPIME ) 2 g in sodium chloride  0.9 % 100 mL IVPB       Placed in "And" Linked Group   2 g 200 mL/hr over 30 Minutes Intravenous  Once 01/11/24 0622 01/11/24 0822   01/11/24 0630  metroNIDAZOLE  (FLAGYL ) IVPB 500 mg       Placed in "And" Linked Group   500 mg 100 mL/hr over 60 Minutes Intravenous  Once 01/11/24 0622 01/11/24 1017        Assessment/Plan Biliary pancreatitis Acute cholecystitis  POD#1 s/p laparoscopic cholecystectomy with IOC 5/19 Dr. Hershell Lose  - AFVSS, WBC 7.3, hgb 10.7 from 11.2 (stable) - appears to have incisional pain that is not adequately controlled: increase tylenol  to 1,000 mg TID, schedule her flexeril  starting with 5 mg TID, add lidoderm  patches to abdominal wall, add PRN tramdol  - also reports epigastric discomfort worse with PO intake. This is consistent with ongoing pancreatitis.lipase is stable at 61. Would continue a CLD for today and not advance. - if her pain is better controlled tomorrow and PO intake improving then she would be stable for D/C from a surgical perspective.   FEN: CLD, IVF per [facility] VTE: LMWH ID: rocephin /flagyl    - per [facility] -  HTN HLD T2DM Recent uterine perforation s/p repair 5/8 by Dr. Vallarie Gauze      LOS: 7 days   I reviewed hospitalist notes, last 24 h vitals and pain scores, last 48 h intake and output, last 24 h labs and trends, and last 24 h imaging results.  This care required moderate level of medical decision making.   Michial Akin, PA-C [facility] Please see Amion for pager number during day hours 7:00am-4:30pm

## 2024-01-19 DIAGNOSIS — N719 Inflammatory disease of uterus, unspecified: Secondary | ICD-10-CM | POA: Diagnosis present

## 2024-01-19 DIAGNOSIS — K851 Biliary acute pancreatitis without necrosis or infection: Secondary | ICD-10-CM | POA: Diagnosis not present

## 2024-01-19 DIAGNOSIS — R1010 Upper abdominal pain, unspecified: Secondary | ICD-10-CM | POA: Diagnosis not present

## 2024-01-19 LAB — CBC
HCT: 32.3 % — ABNORMAL LOW (ref 36.0–46.0)
Hemoglobin: 10.8 g/dL — ABNORMAL LOW (ref 12.0–15.0)
MCH: 30.3 pg (ref 26.0–34.0)
MCHC: 33.4 g/dL (ref 30.0–36.0)
MCV: 90.5 fL (ref 80.0–100.0)
Platelets: 505 10*3/uL — ABNORMAL HIGH (ref 150–400)
RBC: 3.57 MIL/uL — ABNORMAL LOW (ref 3.87–5.11)
RDW: 14.6 % (ref 11.5–15.5)
WBC: 6.7 10*3/uL (ref 4.0–10.5)
nRBC: 0 % (ref 0.0–0.2)

## 2024-01-19 LAB — GLUCOSE, CAPILLARY
Glucose-Capillary: 146 mg/dL — ABNORMAL HIGH (ref 70–99)
Glucose-Capillary: 81 mg/dL (ref 70–99)
Glucose-Capillary: 83 mg/dL (ref 70–99)
Glucose-Capillary: 92 mg/dL (ref 70–99)
Glucose-Capillary: 85 mg/dL (ref 70–99)
Glucose-Capillary: 110 mg/dL — ABNORMAL HIGH (ref 70–99)

## 2024-01-19 LAB — RENAL FUNCTION PANEL
Albumin: 2.7 g/dL — ABNORMAL LOW (ref 3.5–5.0)
Anion gap: 9 (ref 5–15)
BUN: 14 mg/dL (ref 8–23)
CO2: 23 mmol/L (ref 22–32)
Calcium: 8.5 mg/dL — ABNORMAL LOW (ref 8.9–10.3)
Chloride: 99 mmol/L (ref 98–111)
Creatinine, Ser: 1.14 mg/dL — ABNORMAL HIGH (ref 0.44–1.00)
GFR, Estimated: 50 mL/min — ABNORMAL LOW (ref >60)
Glucose, Bld: 84 mg/dL (ref 70–99)
Phosphorus: 2.3 mg/dL — ABNORMAL LOW (ref 2.5–4.6)
Potassium: 3.7 mmol/L (ref 3.5–5.1)
Sodium: 131 mmol/L — ABNORMAL LOW (ref 135–145)

## 2024-01-19 MED ORDER — TRAMADOL HCL 50 MG PO TABS
50.0000 mg | ORAL_TABLET | Freq: Four times a day (QID) | ORAL | Status: DC | PRN
  Administered 2024-01-21: 50 mg via ORAL
  Filled 2024-01-19: qty 1

## 2024-01-19 MED ORDER — OXYCODONE HCL 5 MG PO TABS: 5.0000 mg | ORAL_TABLET | ORAL | Status: DC | PRN

## 2024-01-19 NOTE — Progress Notes (Addendum)
 [name]                                                                              Alexandria Durham, is a 78 y.o. female, DOB - 04/06/1946, WUJ:[id number] Admit date - 01/11/2024    Outpatient Primary MD for the patient is Bayard Limbo, NP  LOS - 8  days  Chief Complaint  Patient presents with   Abdominal Pain       Brief summary   Patient is a 78 year old female with DM type II, HTN, CKD stage III who was discharged last week after treatment for uterine perforation status post D&C.  She was admitted again with acute gallstone pancreatitis with lipase 6700 and likely acute cholecystitis.   Assessment & Plan      Acute pancreatitis--possibly secondary to gallstones Acute cholecystitis - CT reveals stones in the neck of the gallbladder with acute cholecystitis, lipase on admission markedly elevated 6700 -GI and general surgery was consulted. -Repeat CT abdomen on 5/17 showed acute pancreatitis with increased edema and inflammatory changes in the pancreatic head and uncinate process, no fluid collection pseudocyst or abscess.  Mild wall thickening of the distal thoracic esophagus at the GE junction, new could reflect esophagitis -Underwent laparoscopic cholecystectomy and intraoperative cholangiogram on 5/19, noted acute on chronic cholecystitis, postop day # 2 - Tolerating diet, need to mobilize more.  - Still having a lot of pain, continue stool softeners and bowel regimen   Suspected endometritis - Status post D&C on 01/17/2024 for thickened endometrium at the same time as lap chole with Dr. Hershell Lose, postop day # 2 -GynOnc following, pathology pending - Finishing doxycycline  to complete 10-day course per previous GYN recommendations, started on 5/12   Hypokalemia -Replace as needed      Diabetes mellitus type 2, NIDDM --hold glipizide, Januvia -Continue sliding scale insulin , moderate  CBG (last 3)  Recent Labs    01/19/24 0249 01/19/24 0746  01/19/24 1220  GLUCAP 83 85 110*      HTN BP stable  Chronic kidney disease, stage IIIa - Baseline creatinine 1.0-1.1 - Creatinine at baseline   Estimated body mass index is 26.87 kg/m as calculated from the following:   Height as of this encounter: 5\' 4"  (1.626 m).   Weight as of this encounter: 71 kg.  Code Status: Full code DVT Prophylaxis:  enoxaparin  (LOVENOX ) injection 40 mg Start: 01/11/24 1200   Level of Care: Level of care: Med-Surg Family Communication: Updated patient Disposition Plan:      Remains inpatient appropriate: Hopefully DC next 24 to 48 hours if improving  Procedures:   laparoscopic cholecystectomy and intraoperative cholangiogram on 5/19 D&C  Consultants:   General Surgery Gyn Onc  Antimicrobials:   Anti-infectives (From admission, onward)    Start     Dose/Rate Route Frequency Ordered Stop   01/17/24 1215  cefTRIAXone  (ROCEPHIN ) 2 g in sodium chloride  0.9 % 100 mL IVPB  Status:  Discontinued        2 g 200 mL/hr over 30 Minutes Intravenous On call to O.R. 01/17/24 1203 01/17/24 1545   01/11/24 2200  metroNIDAZOLE  (FLAGYL ) IVPB 500 mg  Status:  Discontinued       Placed in "And" Linked Group   500 mg 100 mL/hr over 60 Minutes Intravenous Every 12 hours 01/11/24 0943 01/18/24 1158   01/11/24 1300  doxycycline  (VIBRA -TABS) tablet 100 mg        100 mg Oral Every 12 hours 01/11/24 0937 01/19/24 2359   01/11/24 1200  cefTRIAXone  (ROCEPHIN ) 2 g in sodium chloride  0.9 % 100 mL IVPB  Status:  Discontinued        2 g 200 mL/hr over 30 Minutes Intravenous Daily 01/11/24 1058 01/18/24 1158   01/11/24 0945  cefTRIAXone  (ROCEPHIN ) 2 g in sodium chloride  0.9 % 100 mL IVPB  Status:  Discontinued        2 g 200 mL/hr over 30 Minutes Intravenous  Once 01/11/24 0943 01/11/24 1057   01/11/24 0945  ceFEPIme  (MAXIPIME ) 2 g in sodium chloride  0.9 % 100 mL IVPB  Status:  Discontinued       Placed in "And" Linked Group   2 g 200 mL/hr over 30 Minutes  Intravenous Every 24 hours 01/11/24 0943 01/11/24 0944   01/11/24 0630  ceFEPIme  (MAXIPIME ) 2 g in sodium chloride  0.9 % 100 mL IVPB       Placed in "And" Linked Group   2 g 200 mL/hr over 30 Minutes Intravenous  Once 01/11/24 0622 01/11/24 0822   01/11/24 0630  metroNIDAZOLE  (FLAGYL ) IVPB 500 mg       Placed in "And" Linked Group   500 mg 100 mL/hr over 60 Minutes Intravenous  Once 01/11/24 0622 01/11/24 1017          Medications  acetaminophen   1,000 mg Oral TID WC & HS   buPROPion   150 mg Oral QPM   Chlorhexidine  Gluconate Cloth  6 each Topical Once   cyclobenzaprine   5 mg Oral TID   doxycycline   100 mg Oral Q12H   enoxaparin  (LOVENOX ) injection  40 mg Subcutaneous Q24H   gabapentin   600 mg Oral BID   hydrochlorothiazide   25 mg Oral Daily   insulin  aspart  0-15 Units Subcutaneous Q4H   levothyroxine   75 mcg Oral Q0600   lidocaine   2 patch Transdermal Q24H   lipase/protease/amylase  72,000 Units Oral TID AC   pantoprazole   40 mg Oral Q1200   polycarbophil  625 mg Oral BID   polyethylene glycol  17 g Oral Daily   senna-docusate  1 tablet Oral BID      Subjective:   Alexandria Durham was seen and examined today.  Very weak and deconditioned, states still in significant pain, not eating very well or ambulating.    Objective:   Vitals:   01/18/24 1214 01/18/24 1953 01/19/24 0338 01/19/24 1220  BP: (!) 143/68 (!) 144/63 136/70 (!) 148/73  Pulse: 84 78 79 73  Resp: 18 16 18    Temp: 98 F (36.7 C) 97.8 F (36.6 C) 97.9 F (36.6 C) 97.8 F (36.6 C)  TempSrc: Oral Oral Oral Oral  SpO2: 100% 98% 96% 98%  Weight:      Height:        Intake/Output Summary (Last 24 hours) at 01/19/2024 1310 Last data filed at 01/19/2024 1610 Gross per 24 hour  Intake 480 ml  Output --  Net 480 ml     Wt Readings from Last 3 Encounters:  01/17/24 71 kg  01/06/24 71.3 kg  05/25/21 73 kg    Physical Exam General: Alert and oriented x 3, NAD Cardiovascular: S1 S2 clear,  RRR.   Respiratory: CTAB, no wheezing Gastrointestinal: Soft, appropriately tender, mild distention, incisions CDI Ext: no pedal edema bilaterally Neuro: no new deficits Psych: Normal affect      Data Reviewed:  I have personally reviewed following labs    CBC Lab Results  Component Value Date   WBC 6.7 01/19/2024   RBC 3.57 (L) 01/19/2024   HGB 10.8 (L) 01/19/2024   HCT 32.3 (L) 01/19/2024   MCV 90.5 01/19/2024   MCH 30.3 01/19/2024   PLT 505 (H) 01/19/2024   MCHC 33.4 01/19/2024   RDW 14.6 01/19/2024   LYMPHSABS 1.5 01/15/2024   MONOABS 0.8 01/15/2024   EOSABS 0.1 01/15/2024   BASOSABS 0.0 01/15/2024     Last metabolic panel Lab Results  Component Value Date   NA 131 (L) 01/19/2024   K 3.7 01/19/2024   CL 99 01/19/2024   CO2 23 01/19/2024   BUN 14 01/19/2024   CREATININE 1.14 (H) 01/19/2024   GLUCOSE 84 01/19/2024   GFRNONAA 50 (L) 01/19/2024   GFRAA 51 (L) 03/08/2016   CALCIUM  8.5 (L) 01/19/2024   PHOS 2.3 (L) 01/19/2024   PROT 6.1 (L) 01/18/2024   ALBUMIN 2.7 (L) 01/19/2024   BILITOT 0.5 01/18/2024   ALKPHOS 70 01/18/2024   AST 36 01/18/2024   ALT 22 01/18/2024   ANIONGAP 9 01/19/2024    CBG (last 3)  Recent Labs    01/19/24 0249 01/19/24 0746 01/19/24 1220  GLUCAP 83 85 110*      Coagulation Profile: No results for input(s): "INR", "PROTIME" in the last 168 hours.   Radiology Studies: I have personally reviewed the imaging studies  DG Cholangiogram Operative Result Date: 01/17/2024 CLINICAL DATA:  Intraoperative cholangiogram. EXAM: INTRAOPERATIVE CHOLANGIOGRAM TECHNIQUE: Cholangiographic images from the C-arm fluoroscopic device were submitted for interpretation post-operatively. Please see the procedural report for the amount of contrast and the fluoroscopy time utilized. FLUOROSCOPY: Radiation Exposure Index (as provided by the fluoroscopic device): 4.41 mGy COMPARISON:  Preoperative imaging FINDINGS: Single cine run provided during  intraoperative cholangiogram. Contrast injected into the distal cystic duct. There is opacification of the common bile duct, common hepatic duct and intrahepatic ducts. No visible filling defect or visible choledocholithiasis. Air bubbles are seen in the distal common bile duct. Contrast flows freely into the duodenum. Fluoroscopy time 8 seconds. IMPRESSION: Intraoperative cholangiogram without visible choledocholithiasis. Electronically Signed   By: Chadwick Colonel M.D.   On: 01/17/2024 17:40       Harlin Mazzoni M.D. [name] 01/19/2024, 1:10 PM  Available via Epic secure chat 7am-7pm After 7 pm, please refer to night coverage provider listed on amion.

## 2024-01-19 NOTE — Plan of Care (Signed)

## 2024-01-19 NOTE — Progress Notes (Signed)
 Progress Note  2 Days Post-Op  Subjective: Pt reports she is still having fairly severe pain, does not appear she has really taken much for pain since yesterday. Nausea is better and no vomiting. She reports difficulty taking pills due to heartburn, appears she has several things available for this as well. Having more bowel function. Discussed trying to mobilize more today once pain under better control.   Objective: Vital signs in last 24 hours: Temp:  [97.8 F (36.6 C)-98 F (36.7 C)] 97.9 F (36.6 C) (05/21 0338) Pulse Rate:  [78-84] 79 (05/21 0338) Resp:  [16-18] 18 (05/21 0338) BP: (136-144)/(63-70) 136/70 (05/21 0338) SpO2:  [96 %-100 %] 96 % (05/21 0338) Last BM Date : 01/10/24  Intake/Output from previous day: 05/20 0701 - 05/21 0700 In: 1943.7 [P.O.:960; IV Piggyback:983.7] Out: -  Intake/Output this shift: No intake/output data recorded.  PE: General: pleasant, WD, WN female who is laying in bed in NAD HEENT: sclera anicteric Heart: regular, rate, and rhythm.  Lungs: Respiratory effort nonlabored Abd: soft, appropriately ttp, mild distention, incisions C/D/I Psych: A&Ox3 with an appropriate affect.    Lab Results:  Recent Labs    01/18/24 0442 01/19/24 0451  WBC 7.3 6.7  HGB 10.7* 10.8*  HCT 31.9* 32.3*  PLT 453* 505*   BMET Recent Labs    01/18/24 0442 01/19/24 0451  NA 133* 131*  K 3.3* 3.7  CL 99 99  CO2 23 23  GLUCOSE 88 84  BUN 13 14  CREATININE 1.12* 1.14*  CALCIUM  8.8* 8.5*   PT/INR No results for input(s): "LABPROT", "INR" in the last 72 hours. CMP     Component Value Date/Time   NA 131 (L) 01/19/2024 0451   K 3.7 01/19/2024 0451   CL 99 01/19/2024 0451   CO2 23 01/19/2024 0451   GLUCOSE 84 01/19/2024 0451   BUN 14 01/19/2024 0451   CREATININE 1.14 (H) 01/19/2024 0451   CALCIUM  8.5 (L) 01/19/2024 0451   PROT 6.1 (L) 01/18/2024 0442   ALBUMIN 2.7 (L) 01/19/2024 0451   AST 36 01/18/2024 0442   ALT 22 01/18/2024 0442    ALKPHOS 70 01/18/2024 0442   BILITOT 0.5 01/18/2024 0442   GFRNONAA 50 (L) 01/19/2024 0451   GFRAA 51 (L) 03/08/2016 1618   Lipase     Component Value Date/Time   LIPASE 61 (H) 01/18/2024 0442       Studies/Results: DG Cholangiogram Operative Result Date: 01/17/2024 CLINICAL DATA:  Intraoperative cholangiogram. EXAM: INTRAOPERATIVE CHOLANGIOGRAM TECHNIQUE: Cholangiographic images from the C-arm fluoroscopic device were submitted for interpretation post-operatively. Please see the procedural report for the amount of contrast and the fluoroscopy time utilized. FLUOROSCOPY: Radiation Exposure Index (as provided by the fluoroscopic device): 4.41 mGy COMPARISON:  Preoperative imaging FINDINGS: Single cine run provided during intraoperative cholangiogram. Contrast injected into the distal cystic duct. There is opacification of the common bile duct, common hepatic duct and intrahepatic ducts. No visible filling defect or visible choledocholithiasis. Air bubbles are seen in the distal common bile duct. Contrast flows freely into the duodenum. Fluoroscopy time 8 seconds. IMPRESSION: Intraoperative cholangiogram without visible choledocholithiasis. Electronically Signed   By: Chadwick Colonel M.D.   On: 01/17/2024 17:40    Anti-infectives: Anti-infectives (From admission, onward)    Start     Dose/Rate Route Frequency Ordered Stop   01/17/24 1215  cefTRIAXone  (ROCEPHIN ) 2 g in sodium chloride  0.9 % 100 mL IVPB  Status:  Discontinued        2  g 200 mL/hr over 30 Minutes Intravenous On call to O.R. 01/17/24 1203 01/17/24 1545   01/11/24 2200  metroNIDAZOLE  (FLAGYL ) IVPB 500 mg  Status:  Discontinued       Placed in "And" Linked Group   500 mg 100 mL/hr over 60 Minutes Intravenous Every 12 hours 01/11/24 0943 01/18/24 1158   01/11/24 1300  doxycycline  (VIBRA -TABS) tablet 100 mg        100 mg Oral Every 12 hours 01/11/24 0937     01/11/24 1200  cefTRIAXone  (ROCEPHIN ) 2 g in sodium chloride  0.9 %  100 mL IVPB  Status:  Discontinued        2 g 200 mL/hr over 30 Minutes Intravenous Daily 01/11/24 1058 01/18/24 1158   01/11/24 0945  cefTRIAXone  (ROCEPHIN ) 2 g in sodium chloride  0.9 % 100 mL IVPB  Status:  Discontinued        2 g 200 mL/hr over 30 Minutes Intravenous  Once 01/11/24 0943 01/11/24 1057   01/11/24 0945  ceFEPIme  (MAXIPIME ) 2 g in sodium chloride  0.9 % 100 mL IVPB  Status:  Discontinued       Placed in "And" Linked Group   2 g 200 mL/hr over 30 Minutes Intravenous Every 24 hours 01/11/24 0943 01/11/24 0944   01/11/24 0630  ceFEPIme  (MAXIPIME ) 2 g in sodium chloride  0.9 % 100 mL IVPB       Placed in "And" Linked Group   2 g 200 mL/hr over 30 Minutes Intravenous  Once 01/11/24 0622 01/11/24 0822   01/11/24 0630  metroNIDAZOLE  (FLAGYL ) IVPB 500 mg       Placed in "And" Linked Group   500 mg 100 mL/hr over 60 Minutes Intravenous  Once 01/11/24 0622 01/11/24 1017        Assessment/Plan  Biliary pancreatitis Acute cholecystitis  POD#2 s/p laparoscopic cholecystectomy with IOC Dr. Hershell Lose  - AFVSS, WBC 6.7, Hgb stable at 10.8 - appears to have incisional pain that is not adequately controlled: continue tylenol  1,000 mg TID, continue flexeril  5 mg TID, continue lidoderm  patches to abdominal wall, PRN tramadol  for moderate pain and oxycodone  for more severe pain, IV dilaudid  for breakthrough pain - needs to mobilize more - nausea improved but still having poor pain control  - if her pain is better controlled later today vs tomorrow and PO intake improving then she would be stable for D/C from a surgical perspective.   FEN: soft, IVF per [facility] VTE: LMWH ID: PO doxy - no further need for abx from general surgery standpoint    - per [facility] -  HTN HLD T2DM Recent uterine perforation s/p repair 5/8 by Dr. Vallarie Gauze       LOS: 8 days     Annetta Killian, [facility] 01/19/2024, 8:30 AM Please see Amion for pager number during day hours 7:00am-4:30pm

## 2024-01-19 NOTE — Progress Notes (Signed)
 Mobility Specialist - Progress Note   01/19/24 0959  Mobility  Activity Ambulated with assistance in hallway  Level of Assistance Contact guard assist, steadying assist  Assistive Device Front wheel walker  Distance Ambulated (ft) 200 ft  Activity Response Tolerated well  Mobility Referral Yes  Mobility visit 1 Mobility  Mobility Specialist Start Time (ACUTE ONLY) 0946  Mobility Specialist Stop Time (ACUTE ONLY) 0958  Mobility Specialist Time Calculation (min) (ACUTE ONLY) 12 min   Pt received in bed and agreeable to mobility. Pt was minA from supine>sitting & getting back into bed. Pt took x1 standing rest break d/t fatigue. No complaints during session. Pt to bed after session with all needs met.    [facility]

## 2024-01-20 DIAGNOSIS — R1010 Upper abdominal pain, unspecified: Secondary | ICD-10-CM | POA: Diagnosis not present

## 2024-01-20 DIAGNOSIS — E890 Postprocedural hypothyroidism: Secondary | ICD-10-CM | POA: Diagnosis not present

## 2024-01-20 DIAGNOSIS — K8511 Biliary acute pancreatitis with uninfected necrosis: Secondary | ICD-10-CM

## 2024-01-20 DIAGNOSIS — N719 Inflammatory disease of uterus, unspecified: Secondary | ICD-10-CM | POA: Diagnosis not present

## 2024-01-20 LAB — RENAL FUNCTION PANEL
Albumin: 2.9 g/dL — ABNORMAL LOW (ref 3.5–5.0)
Anion gap: 9 (ref 5–15)
BUN: 18 mg/dL (ref 8–23)
CO2: 24 mmol/L (ref 22–32)
Calcium: 8.8 mg/dL — ABNORMAL LOW (ref 8.9–10.3)
Chloride: 98 mmol/L (ref 98–111)
Creatinine, Ser: 1.31 mg/dL — ABNORMAL HIGH (ref 0.44–1.00)
GFR, Estimated: 42 mL/min — ABNORMAL LOW (ref >60)
Glucose, Bld: 94 mg/dL (ref 70–99)
Phosphorus: 2.9 mg/dL (ref 2.5–4.6)
Potassium: 3.7 mmol/L (ref 3.5–5.1)
Sodium: 131 mmol/L — ABNORMAL LOW (ref 135–145)

## 2024-01-20 LAB — GLUCOSE, CAPILLARY
Glucose-Capillary: 101 mg/dL — ABNORMAL HIGH (ref 70–99)
Glucose-Capillary: 113 mg/dL — ABNORMAL HIGH (ref 70–99)
Glucose-Capillary: 137 mg/dL — ABNORMAL HIGH (ref 70–99)
Glucose-Capillary: 80 mg/dL (ref 70–99)
Glucose-Capillary: 98 mg/dL (ref 70–99)
Glucose-Capillary: 109 mg/dL — ABNORMAL HIGH (ref 70–99)

## 2024-01-20 LAB — CBC
HCT: 36.6 % (ref 36.0–46.0)
Hemoglobin: 11.9 g/dL — ABNORMAL LOW (ref 12.0–15.0)
MCH: 29.8 pg (ref 26.0–34.0)
MCHC: 32.5 g/dL (ref 30.0–36.0)
MCV: 91.7 fL (ref 80.0–100.0)
Platelets: 551 10*3/uL — ABNORMAL HIGH (ref 150–400)
RBC: 3.99 MIL/uL (ref 3.87–5.11)
RDW: 14.6 % (ref 11.5–15.5)
WBC: 7.3 10*3/uL (ref 4.0–10.5)
nRBC: 0 % (ref 0.0–0.2)

## 2024-01-20 LAB — HEPATIC FUNCTION PANEL
ALT: 18 U/L (ref 0–44)
AST: 22 U/L (ref 15–41)
Albumin: 2.9 g/dL — ABNORMAL LOW (ref 3.5–5.0)
Alkaline Phosphatase: 76 U/L (ref 38–126)
Bilirubin, Direct: <0.1 mg/dL (ref 0.0–0.2)
Total Bilirubin: 0.3 mg/dL (ref 0.0–1.2)
Total Protein: 6.6 g/dL (ref 6.5–8.1)

## 2024-01-20 LAB — LIPASE, BLOOD: Lipase: 211 U/L — ABNORMAL HIGH (ref 11–51)

## 2024-01-20 MED ORDER — SODIUM CHLORIDE 0.9 % IV SOLN: INTRAVENOUS | Status: AC

## 2024-01-20 NOTE — Plan of Care (Signed)

## 2024-01-20 NOTE — Progress Notes (Signed)
 Progress Note  3 Days Post-Op  Subjective: Pt reports she is still having fairly severe pain, did not take any oxycodone  or tramadol  for pain yesterday. Taking tylenol . Having bowel function.   Objective: Vital signs in last 24 hours: Temp:  [97.7 F (36.5 C)-98.3 F (36.8 C)] 98.3 F (36.8 C) (05/22 1006) Pulse Rate:  [73-80] 80 (05/22 1006) Resp:  [17-18] 18 (05/22 1006) BP: (148-175)/(69-77) 175/77 (05/22 1006) SpO2:  [97 %-98 %] 98 % (05/22 1006) Last BM Date : 01/10/24  Intake/Output from previous day: 05/21 0701 - 05/22 0700 In: 480 [P.O.:480] Out: -  Intake/Output this shift: Total I/O In: 60 [P.O.:60] Out: -   PE: General: pleasant, WD, WN female who is laying in bed in NAD HEENT: sclera anicteric Heart: regular, rate, and rhythm.  Lungs: Respiratory effort nonlabored Abd: soft, appropriately ttp, mild distention, incisions C/D/I Psych: A&Ox3 with an appropriate affect.    Lab Results:  Recent Labs    01/19/24 0451 01/20/24 0455  WBC 6.7 7.3  HGB 10.8* 11.9*  HCT 32.3* 36.6  PLT 505* 551*   BMET Recent Labs    01/19/24 0451 01/20/24 0455  NA 131* 131*  K 3.7 3.7  CL 99 98  CO2 23 24  GLUCOSE 84 94  BUN 14 18  CREATININE 1.14* 1.31*  CALCIUM  8.5* 8.8*   PT/INR No results for input(s): "LABPROT", "INR" in the last 72 hours. CMP     Component Value Date/Time   NA 131 (L) 01/20/2024 0455   K 3.7 01/20/2024 0455   CL 98 01/20/2024 0455   CO2 24 01/20/2024 0455   GLUCOSE 94 01/20/2024 0455   BUN 18 01/20/2024 0455   CREATININE 1.31 (H) 01/20/2024 0455   CALCIUM  8.8 (L) 01/20/2024 0455   PROT 6.1 (L) 01/18/2024 0442   ALBUMIN 2.9 (L) 01/20/2024 0455   AST 36 01/18/2024 0442   ALT 22 01/18/2024 0442   ALKPHOS 70 01/18/2024 0442   BILITOT 0.5 01/18/2024 0442   GFRNONAA 42 (L) 01/20/2024 0455   GFRAA 51 (L) 03/08/2016 1618   Lipase     Component Value Date/Time   LIPASE 61 (H) 01/18/2024 0442       Studies/Results: No  results found.   Anti-infectives: Anti-infectives (From admission, onward)    Start     Dose/Rate Route Frequency Ordered Stop   01/17/24 1215  cefTRIAXone  (ROCEPHIN ) 2 g in sodium chloride  0.9 % 100 mL IVPB  Status:  Discontinued        2 g 200 mL/hr over 30 Minutes Intravenous On call to O.R. 01/17/24 1203 01/17/24 1545   01/11/24 2200  metroNIDAZOLE  (FLAGYL ) IVPB 500 mg  Status:  Discontinued       Placed in "And" Linked Group   500 mg 100 mL/hr over 60 Minutes Intravenous Every 12 hours 01/11/24 0943 01/18/24 1158   01/11/24 1300  doxycycline  (VIBRA -TABS) tablet 100 mg        100 mg Oral Every 12 hours 01/11/24 0937 01/19/24 2359   01/11/24 1200  cefTRIAXone  (ROCEPHIN ) 2 g in sodium chloride  0.9 % 100 mL IVPB  Status:  Discontinued        2 g 200 mL/hr over 30 Minutes Intravenous Daily 01/11/24 1058 01/18/24 1158   01/11/24 0945  cefTRIAXone  (ROCEPHIN ) 2 g in sodium chloride  0.9 % 100 mL IVPB  Status:  Discontinued        2 g 200 mL/hr over 30 Minutes Intravenous  Once 01/11/24 0943 01/11/24  1057   01/11/24 0945  ceFEPIme  (MAXIPIME ) 2 g in sodium chloride  0.9 % 100 mL IVPB  Status:  Discontinued       Placed in "And" Linked Group   2 g 200 mL/hr over 30 Minutes Intravenous Every 24 hours 01/11/24 0943 01/11/24 0944   01/11/24 0630  ceFEPIme  (MAXIPIME ) 2 g in sodium chloride  0.9 % 100 mL IVPB       Placed in "And" Linked Group   2 g 200 mL/hr over 30 Minutes Intravenous  Once 01/11/24 0622 01/11/24 0822   01/11/24 0630  metroNIDAZOLE  (FLAGYL ) IVPB 500 mg       Placed in "And" Linked Group   500 mg 100 mL/hr over 60 Minutes Intravenous  Once 01/11/24 0622 01/11/24 1017        Assessment/Plan  Biliary pancreatitis Acute cholecystitis  POD#3 s/p laparoscopic cholecystectomy with IOC Dr. Hershell Lose  - AFVSS, WBC 7.3, Hgb stable at 11.9 - appears to have incisional pain that is not adequately controlled: continue tylenol  1,000 mg TID, continue flexeril  5 mg TID, continue lidoderm   patches to abdominal wall, PRN tramadol  for moderate pain and oxycodone  for more severe pain, IV dilaudid  for breakthrough pain - needs to mobilize more - nausea improved but still having poor pain control  - pt seems to not be taking available medications for pain control - check LFTs and lipase to r/o other complicating features but otherwise would recommend she take pain meds if needed. Could discharge home from surgical perspective if labs ok and pain better after taking medications   FEN: soft, IVF per [facility] VTE: LMWH ID: PO doxy     - per [facility] -  HTN HLD T2DM Recent uterine perforation s/p repair 5/8 by Dr. Vallarie Gauze       LOS: 9 days     Annetta Killian, [facility] 01/20/2024, 10:46 AM Please see Amion for pager number during day hours 7:00am-4:30pm

## 2024-01-20 NOTE — Progress Notes (Signed)
 [name]                                                                              Alexandria Durham, is a 78 y.o. female, DOB - August 08, 1946, QIO:[id number] Admit date - 01/11/2024    Outpatient Primary MD for the patient is Alexandria Limbo, NP  LOS - 9  days  Chief Complaint  Patient presents with   Abdominal Pain       Brief summary   Patient is a 78 year old female with DM type II, HTN, CKD stage III who was discharged last week after treatment for uterine perforation status post D&C.  She was admitted again with acute gallstone pancreatitis with lipase 6700 and likely acute cholecystitis.   Assessment & Plan      Acute pancreatitis--possibly secondary to gallstones Acute cholecystitis - CT reveals stones in the neck of the gallbladder with acute cholecystitis, lipase on admission markedly elevated 6700.  GI, general surgery consulted. -Repeat CT abdomen on 5/17 showed acute pancreatitis with increased edema and inflammatory changes in the pancreatic head and uncinate process, no fluid collection pseudocyst or abscess.  Mild wall thickening of the distal thoracic esophagus at the GE junction, new could reflect esophagitis -Underwent laparoscopic cholecystectomy on 5/19, noted acute on chronic cholecystitis, postop day # 3 - Patient continues to complain of abdominal pain, not adequately controlled however she has the IV Dilaudid  however only took 1 dose yesterday at 8:35 AM and not taking any oxycodone .  Patient recommended to continue using pain medications as needed, increase mobility, out of bed to the chair which will help her overall postoperative recovery - PT today, out of bed to chair, increase mobility - Lipase 211, trended up from 61 yesterday-> placed on IV fluid hydration, continue pain control, bowel regimen   Suspected endometritis - Status post D&C on 01/17/2024 for thickened endometrium at the same time as lap chole with Dr. Hershell Lose, postop day #  2 -GynOnc following, pathology pending - Completed doxycycline , 10-day course per previous GYN recommendations, started on 5/12   Hypokalemia -Replace as needed      Diabetes mellitus type 2, NIDDM --hold glipizide, Januvia -Continue sliding scale insulin , moderate  CBG (last 3)  Recent Labs    01/20/24 0500 01/20/24 0747 01/20/24 1131  GLUCAP 98 101* 109*      HTN BP stable  Mild acute on chronic kidney disease, stage IIIa - Baseline creatinine 1.0-1.1 - Creatinine worsened to 1.3, not eating well, -placed on gentle IV fluid hydration   Estimated body mass index is 26.87 kg/m as calculated from the following:   Height as of this encounter: 5\' 4"  (1.626 m).   Weight as of this encounter: 71 kg.  Code Status: Full code DVT Prophylaxis:  enoxaparin  (LOVENOX ) injection 40 mg Start: 01/11/24 1200   Level of Care: Level of care: Med-Surg Family Communication: Updated patient Disposition Plan:      Remains inpatient appropriate: Hopefully DC tomorrow if tolerating diet, creatinine improving and pain better controlled.  Procedures:   laparoscopic cholecystectomy and intraoperative cholangiogram on 5/19 D&C  Consultants:   General Surgery Gyn Onc  Antimicrobials:  Anti-infectives (From admission, onward)    Start     Dose/Rate Route Frequency Ordered Stop   01/17/24 1215  cefTRIAXone  (ROCEPHIN ) 2 g in sodium chloride  0.9 % 100 mL IVPB  Status:  Discontinued        2 g 200 mL/hr over 30 Minutes Intravenous On call to O.R. 01/17/24 1203 01/17/24 1545   01/11/24 2200  metroNIDAZOLE  (FLAGYL ) IVPB 500 mg  Status:  Discontinued       Placed in "And" Linked Group   500 mg 100 mL/hr over 60 Minutes Intravenous Every 12 hours 01/11/24 0943 01/18/24 1158   01/11/24 1300  doxycycline  (VIBRA -TABS) tablet 100 mg        100 mg Oral Every 12 hours 01/11/24 0937 01/19/24 2359   01/11/24 1200  cefTRIAXone  (ROCEPHIN ) 2 g in sodium chloride  0.9 % 100 mL IVPB  Status:   Discontinued        2 g 200 mL/hr over 30 Minutes Intravenous Daily 01/11/24 1058 01/18/24 1158   01/11/24 0945  cefTRIAXone  (ROCEPHIN ) 2 g in sodium chloride  0.9 % 100 mL IVPB  Status:  Discontinued        2 g 200 mL/hr over 30 Minutes Intravenous  Once 01/11/24 0943 01/11/24 1057   01/11/24 0945  ceFEPIme  (MAXIPIME ) 2 g in sodium chloride  0.9 % 100 mL IVPB  Status:  Discontinued       Placed in "And" Linked Group   2 g 200 mL/hr over 30 Minutes Intravenous Every 24 hours 01/11/24 0943 01/11/24 0944   01/11/24 0630  ceFEPIme  (MAXIPIME ) 2 g in sodium chloride  0.9 % 100 mL IVPB       Placed in "And" Linked Group   2 g 200 mL/hr over 30 Minutes Intravenous  Once 01/11/24 0622 01/11/24 0822   01/11/24 0630  metroNIDAZOLE  (FLAGYL ) IVPB 500 mg       Placed in "And" Linked Group   500 mg 100 mL/hr over 60 Minutes Intravenous  Once 01/11/24 0622 01/11/24 1017          Medications  acetaminophen   1,000 mg Oral TID WC & HS   buPROPion   150 mg Oral QPM   Chlorhexidine  Gluconate Cloth  6 each Topical Once   cyclobenzaprine   5 mg Oral TID   enoxaparin  (LOVENOX ) injection  40 mg Subcutaneous Q24H   gabapentin   600 mg Oral BID   insulin  aspart  0-15 Units Subcutaneous Q4H   levothyroxine   75 mcg Oral Q0600   lidocaine   2 patch Transdermal Q24H   lipase/protease/amylase  72,000 Units Oral TID AC   pantoprazole   40 mg Oral Q1200   polycarbophil  625 mg Oral BID   polyethylene glycol  17 g Oral Daily   senna-docusate  1 tablet Oral BID      Subjective:   Alexandria Durham was seen and examined today.  Weak and deconditioned, complaining of pain, not well-controlled.,  No active vomiting fevers or chills.   Objective:   Vitals:   01/19/24 0338 01/19/24 1220 01/19/24 2025 01/20/24 1006  BP: 136/70 (!) 148/73 (!) 155/69 (!) 175/77  Pulse: 79 73 78 80  Resp: 18  17 18   Temp: 97.9 F (36.6 C) 97.8 F (36.6 C) 97.7 F (36.5 C) 98.3 F (36.8 C)  TempSrc: Oral Oral Oral   SpO2: 96% 98%  97% 98%  Weight:      Height:        Intake/Output Summary (Last 24 hours) at 01/20/2024 1331 Last data filed  at 01/20/2024 1057 Gross per 24 hour  Intake 240 ml  Output --  Net 240 ml     Wt Readings from Last 3 Encounters:  01/17/24 71 kg  01/06/24 71.3 kg  05/25/21 73 kg   Physical Exam General: Alert and awake, appears uncomfortable Cardiovascular: S1 S2 clear, RRR.  Respiratory: CTAB, no wheezing Gastrointestinal: Soft, mild diffuse TTP, mild distention, incisions CDI  Ext: no pedal edema bilaterally Neuro: no new deficits Psych: Normal affect      Data Reviewed:  I have personally reviewed following labs    CBC Lab Results  Component Value Date   WBC 7.3 01/20/2024   RBC 3.99 01/20/2024   HGB 11.9 (L) 01/20/2024   HCT 36.6 01/20/2024   MCV 91.7 01/20/2024   MCH 29.8 01/20/2024   PLT 551 (H) 01/20/2024   MCHC 32.5 01/20/2024   RDW 14.6 01/20/2024   LYMPHSABS 1.5 01/15/2024   MONOABS 0.8 01/15/2024   EOSABS 0.1 01/15/2024   BASOSABS 0.0 01/15/2024     Last metabolic panel Lab Results  Component Value Date   NA 131 (L) 01/20/2024   K 3.7 01/20/2024   CL 98 01/20/2024   CO2 24 01/20/2024   BUN 18 01/20/2024   CREATININE 1.31 (H) 01/20/2024   GLUCOSE 94 01/20/2024   GFRNONAA 42 (L) 01/20/2024   GFRAA 51 (L) 03/08/2016   CALCIUM  8.8 (L) 01/20/2024   PHOS 2.9 01/20/2024   PROT 6.6 01/20/2024   ALBUMIN 2.9 (L) 01/20/2024   ALBUMIN 2.9 (L) 01/20/2024   BILITOT 0.3 01/20/2024   ALKPHOS 76 01/20/2024   AST 22 01/20/2024   ALT 18 01/20/2024   ANIONGAP 9 01/20/2024    CBG (last 3)  Recent Labs    01/20/24 0500 01/20/24 0747 01/20/24 1131  GLUCAP 98 101* 109*      Coagulation Profile: No results for input(s): "INR", "PROTIME" in the last 168 hours.   Radiology Studies: I have personally reviewed the imaging studies  No results found.      Bertram Brocks M.D. [name] 01/20/2024, 1:31 PM  Available via Epic secure chat  7am-7pm After 7 pm, please refer to night coverage provider listed on amion.

## 2024-01-20 NOTE — Progress Notes (Signed)
 Physical Therapy Treatment Patient Details Name: Alexandria Durham MRN: [id number] DOB: [birth year] Today's Date: 01/20/2024   History of Present Illness Patient is a 78 year old female who presented on 5/13 with abdominal pain, emesis and nausea. CT revealed acute cholecystitis and pancreatitis.   Patient underwent laparoscopic cholecystectomy with intraoperative cholangiogram on 5/19. PMH: D&C hysteroscopy 01/06/24, DM II, HTN, CKD III.    PT Comments  The patient  slowly improving with mobility , indicating abdominal pain . Patient ambulated in room to BR and to recliner using a RW. Patient  remained up in recliner. Patient plans Dc home with family support.  Recommend HHPT.   If plan is discharge home, recommend the following: Help with stairs or ramp for entrance;Assistance with cooking/housework;A little help with bathing/dressing/bathroom   Can travel by private vehicle        Equipment Recommendations  None recommended by PT    Recommendations for Other Services       Precautions / Restrictions Precautions Precautions: Fall Precaution/Restrictions Comments: multiple  small abd incisions     Mobility  Bed Mobility   Bed Mobility: Rolling, Sidelying to Sit Rolling: Supervision, Used rails Sidelying to sit: Supervision, Used rails       General bed mobility comments: increased time and effort due to pain,    Transfers Overall transfer level: Needs assistance Equipment used: Rolling walker (2 wheels) Transfers: Sit to/from Stand Sit to Stand: Contact guard assist           General transfer comment: cues for hand placement, used rail at toilet    Ambulation/Gait Ambulation/Gait assistance: Contact guard assist Gait Distance (Feet): 20 Feet (then 30') Assistive device: Rolling walker (2 wheels) Gait Pattern/deviations: Step-through pattern, Decreased stride length, Decreased stance time - left, Antalgic, Trunk flexed Gait velocity: decr     General Gait  Details: slow  speed,tends to keep trunk flexed   Stairs             Wheelchair Mobility     Tilt Bed    Modified Rankin (Stroke Patients Only)       Balance Overall balance assessment: Mild deficits observed, not formally tested                                          Communication Communication Communication: No apparent difficulties  Cognition Arousal: Alert Behavior During Therapy: Flat affect   PT - Cognitive impairments: No apparent impairments                       PT - Cognition Comments: keeps eyes closed most of the time Following commands: Intact      Cueing    Exercises      General Comments        Pertinent Vitals/Pain Pain Assessment Pain Assessment: Faces Faces Pain Scale: Hurts whole lot Pain Descriptors / Indicators: Sore, Grimacing, Guarding Pain Intervention(s): Monitored during session    Home Living                          Prior Function            PT Goals (current goals can now be found in the care plan section) Progress towards PT goals: Progressing toward goals    Frequency    Min 3X/week      PT  Plan      Co-evaluation              AM-PAC PT "6 Clicks" Mobility   Outcome Measure  Help needed turning from your back to your side while in a flat bed without using bedrails?: None Help needed moving from lying on your back to sitting on the side of a flat bed without using bedrails?: None Help needed moving to and from a bed to a chair (including a wheelchair)?: A Little Help needed standing up from a chair using your arms (e.g., wheelchair or bedside chair)?: A Little Help needed to walk in hospital room?: A Little Help needed climbing 3-5 steps with a railing? : A Lot 6 Click Score: 19    End of Session Equipment Utilized During Treatment: Gait belt Activity Tolerance: Patient tolerated treatment well;Patient limited by pain Patient left: in chair;with call  bell/phone within reach (chair alarm not working) Nurse Communication: Mobility status PT Visit Diagnosis: Difficulty in walking, not elsewhere classified (R26.2)     Time: 8657-8469 PT Time Calculation (min) (ACUTE ONLY): 18 min  Charges:    $Gait Training: 8-22 mins PT General Charges $$ ACUTE PT VISIT: 1 Visit                     Abelina Hoes PT Acute Rehabilitation Services Office [phone] [address] 01/20/2024, 9:21 AM

## 2024-01-21 DIAGNOSIS — K851 Biliary acute pancreatitis without necrosis or infection: Secondary | ICD-10-CM | POA: Diagnosis not present

## 2024-01-21 LAB — CBC
HCT: 33.6 % — ABNORMAL LOW (ref 36.0–46.0)
Hemoglobin: 10.7 g/dL — ABNORMAL LOW (ref 12.0–15.0)
MCH: 30.1 pg (ref 26.0–34.0)
MCHC: 31.8 g/dL (ref 30.0–36.0)
MCV: 94.6 fL (ref 80.0–100.0)
Platelets: 469 10*3/uL — ABNORMAL HIGH (ref 150–400)
RBC: 3.55 MIL/uL — ABNORMAL LOW (ref 3.87–5.11)
RDW: 14.9 % (ref 11.5–15.5)
WBC: 6.6 10*3/uL (ref 4.0–10.5)
nRBC: 0 % (ref 0.0–0.2)

## 2024-01-21 LAB — RENAL FUNCTION PANEL
Albumin: 2.5 g/dL — ABNORMAL LOW (ref 3.5–5.0)
Anion gap: 8 (ref 5–15)
BUN: 19 mg/dL (ref 8–23)
CO2: 22 mmol/L (ref 22–32)
Calcium: 8.3 mg/dL — ABNORMAL LOW (ref 8.9–10.3)
Chloride: 106 mmol/L (ref 98–111)
Creatinine, Ser: 1.11 mg/dL — ABNORMAL HIGH (ref 0.44–1.00)
GFR, Estimated: 52 mL/min — ABNORMAL LOW (ref >60)
Glucose, Bld: 86 mg/dL (ref 70–99)
Phosphorus: 2.6 mg/dL (ref 2.5–4.6)
Potassium: 3.6 mmol/L (ref 3.5–5.1)
Sodium: 136 mmol/L (ref 135–145)

## 2024-01-21 LAB — GLUCOSE, CAPILLARY
Glucose-Capillary: 104 mg/dL — ABNORMAL HIGH (ref 70–99)
Glucose-Capillary: 106 mg/dL — ABNORMAL HIGH (ref 70–99)
Glucose-Capillary: 133 mg/dL — ABNORMAL HIGH (ref 70–99)
Glucose-Capillary: 175 mg/dL — ABNORMAL HIGH (ref 70–99)
Glucose-Capillary: 91 mg/dL (ref 70–99)
Glucose-Capillary: 91 mg/dL (ref 70–99)

## 2024-01-21 LAB — LIPASE, BLOOD: Lipase: 295 U/L — ABNORMAL HIGH (ref 11–51)

## 2024-01-21 MED ORDER — ONDANSETRON HCL 4 MG PO TABS: 4.0000 mg | ORAL_TABLET | Freq: Four times a day (QID) | ORAL | 0 refills | Status: DC | PRN

## 2024-01-21 MED ORDER — OXYCODONE HCL 5 MG PO TABS: 5.0000 mg | ORAL_TABLET | Freq: Three times a day (TID) | ORAL | 0 refills | Status: DC | PRN

## 2024-01-21 NOTE — Progress Notes (Signed)
 Occupational Therapy Treatment Patient Details Name: Alexandria Durham MRN: [id number] DOB: Jun 26, 1946 Today's Date: 01/21/2024   History of present illness Patient is a 78 year old female who presented on 5/13 with abdominal pain, emesis and nausea. CT revealed acute cholecystitis and pancreatitis.   Patient underwent laparoscopic cholecystectomy with intraoperative cholangiogram on 5/19. PMH: D&C hysteroscopy 01/06/24, DM II, HTN, CKD III.   OT comments  Pt making progress towards goals. Performs bed mobility using logroll technique with excellent return demo and no pain reported throughout session. Functional mobility with use of RW, slow walking speed but steady at supervision level ~ 200 ft. Discharge recommendation remains appropriate, pt will benefit from [facility] with family support.       If plan is discharge home, recommend the following:  A lot of help with bathing/dressing/bathroom;A little help with walking and/or transfers;Direct supervision/assist for medications management;Assist for transportation;Assistance with cooking/housework;Direct supervision/assist for financial management;Help with stairs or ramp for entrance   Equipment Recommendations  None recommended by OT       Precautions / Restrictions Precautions Precautions: Fall Recall of Precautions/Restrictions: Intact Restrictions Weight Bearing Restrictions Per Provider Order: No       Mobility Bed Mobility Overal bed mobility: Needs Assistance Bed Mobility: Rolling, Sidelying to Sit Rolling: Supervision, Used rails Sidelying to sit: Supervision, Used rails       General bed mobility comments: log roll technique, no verbal cues required, good return demo    Transfers Overall transfer level: Needs assistance Equipment used: Rolling walker (2 wheels) Transfers: Sit to/from Stand Sit to Stand: Supervision                 Balance Overall balance assessment: Mild deficits observed, not formally tested                                          ADL either performed or assessed with clinical judgement   ADL Overall ADL's : Needs assistance/impaired                                     Functional mobility during ADLs: Supervision/safety;Rolling walker (2 wheels) General ADL Comments: Pt polietely declines ADLs this date, requests to focus on functional mobility     Communication Communication Communication: No apparent difficulties   Cognition Arousal: Alert Behavior During Therapy: Flat affect                                 Following commands: Intact        Cueing   Cueing Techniques: Verbal cues             Pertinent Vitals/ Pain       Pain Assessment Pain Assessment: No/denies pain         Frequency  Min 2X/week        Progress Toward Goals  OT Goals(current goals can now be found in the care plan section)  Progress towards OT goals: Progressing toward goals  Acute Rehab OT Goals OT Goal Formulation: With patient Time For Goal Achievement: 02/01/24 Potential to Achieve Goals: Fair ADL Goals Pt Will Perform Grooming: with modified independence;standing Pt Will Perform Lower Body Dressing: with modified independence;sit to/from stand Pt Will Transfer to Toilet: with modified independence;regular  height toilet;ambulating Pt Will Perform Toileting - Clothing Manipulation and hygiene: with modified independence;sit to/from stand  Plan         AM-PAC OT "6 Clicks" Daily Activity     Outcome Measure   Help from another person eating meals?: None Help from another person taking care of personal grooming?: None Help from another person toileting, which includes using toliet, bedpan, or urinal?: A Little Help from another person bathing (including washing, rinsing, drying)?: A Lot Help from another person to put on and taking off regular upper body clothing?: A Little Help from another person to put on and taking  off regular lower body clothing?: A Lot 6 Click Score: 18    End of Session Equipment Utilized During Treatment: Rolling walker (2 wheels)  OT Visit Diagnosis: Unsteadiness on feet (R26.81);Other abnormalities of gait and mobility (R26.89);Pain   Activity Tolerance Patient tolerated treatment well   Patient Left in bed;with call bell/phone within reach;with bed alarm set   Nurse Communication Mobility status        Time: 8295-6213 OT Time Calculation (min): 17 min  Charges: OT General Charges $OT Visit: 1 Visit OT Treatments $Self Care/Home Management : 8-22 mins  Kimorah Ridolfi L. Manvir Prabhu, OTR/L  01/21/24, 1:16 PM

## 2024-01-21 NOTE — Discharge Summary (Signed)
 Physician Discharge Summary   Patient: Alexandria Durham MRN: [id number] DOB: 12-05-1945  Admit date:     01/11/2024  Discharge date: 01/21/24  Discharge Physician: Ephriam Hashimoto   PCP: Bayard Limbo, NP     Recommendations at discharge:  Follow up with General Surgery for cholecystectomy Follow up with PCP Bayard Limbo for acute gallstone pancreatitis Follow up with Gynecology Dr. Orvil Bland for endometritis Bayard Limbo: Please check CBC and BMP in 1 week (discharge creatinine 1.1, hemoglobin 10.7) Please ensure home BP medicines have been restarted     Discharge Diagnoses: Principal Problem:   Acute pancreatitis Active Problems:   Benign hypertension with CKD (chronic kidney disease) stage III (HCC)   Stage 3a chronic kidney disease (HCC)   Postoperative hypothyroidism   Type 2 diabetes mellitus with diabetic polyneuropathy, without long-term current use of insulin  (HCC)   Endometritis   AKI ruled out     Hospital Course: Patient is a 78 year old female with DM type II, HTN, CKD stage III who was discharged last week after treatment for uterine perforation status post D&C. She was admitted again with acute gallstone pancreatitis with lipase 6700 and likely acute cholecystitis.     Acute pancreatitis--possibly secondary to gallstones Acute cholecystitis - CT reveals stones in the neck of the gallbladder with acute cholecystitis, lipase on admission markedly elevated 6700.  GI, general surgery consulted. -Repeat CT abdomen on 5/17 showed acute pancreatitis with increased edema and inflammatory changes in the pancreatic head and uncinate process, no fluid collection pseudocyst or abscess.  Mild wall thickening of the distal thoracic esophagus at the GE junction, new could reflect esophagitis -Underwent laparoscopic cholecystectomy on 5/19, noted acute on chronic cholecystitis, postop day # 3 - Patient continues to complain of abdominal pain, not adequately controlled  however she has the IV Dilaudid  however only took 1 dose yesterday at 8:35 AM and not taking any oxycodone .  Patient recommended to continue using pain medications as needed, increase mobility, out of bed to the chair which will help her overall postoperative recovery - PT today, out of bed to chair, increase mobility - Lipase 211, trended up from 61 yesterday-> placed on IV fluid hydration, continue pain control, bowel regimen     Suspected endometritis - Status post D&C on 01/17/2024 for thickened endometrium at the same time as lap chole with Dr. Hershell Lose, postop day # 2 -GynOnc following, pathology pending - Completed doxycycline , 10-day course per previous GYN recommendations, started on 5/12                The [place]  Controlled Substances Registry was reviewed for this patient prior to discharge.  Consultants: General Surgery GYN Oncology  Procedures performed:  laparoscopic cholecystectomy and intraoperative cholangiogram on 5/19 D&C   Disposition: Home Diet recommendation:  Discharge Diet Orders (From admission, onward)     Start     Ordered   01/17/24 0000  Diet - low sodium heart healthy       Comments: Start with a bland diet such as soups, liquids, starchy foods, low fat foods, etc. the first few days at home. Gradually advance to a solid, low-fat, high fiber diet by the end of the first week at home.   Add a fiber supplement to your diet (Metamucil, etc) If you feel full, bloated, or constipated, stay on a full liquid or pureed/blenderized diet for a few days until you feel better and are no longer constipated.   01/17/24 1416  DISCHARGE MEDICATION: Allergies as of 01/21/2024       Reactions   Ace Inhibitors Hives, Itching   Aspirin Palpitations   Codeine Palpitations   Penicillins Rash        Medication List     STOP taking these medications    doxycycline  100 MG tablet Commonly known as: VIBRA -TABS    HYDROcodone -homatropine 5-1.5 MG/5ML syrup Commonly known as: HYCODAN   pregabalin 75 MG capsule Commonly known as: LYRICA   traMADol  50 MG tablet Commonly known as: ULTRAM        TAKE these medications    acetaminophen  500 MG tablet Commonly known as: TYLENOL  Take 1,000 mg by mouth every 6 (six) hours as needed for moderate pain.   ammonium lactate 12 % cream Commonly known as: AMLACTIN Apply 1 Application topically as needed for dry skin.   bisacodyl  5 MG EC tablet Generic drug: bisacodyl  Take 5 mg by mouth daily as needed for moderate constipation.   buPROPion  150 MG 24 hr tablet Commonly known as: WELLBUTRIN  XL Take 150 mg by mouth every evening.   diphenhydramine -acetaminophen  25-500 MG Tabs tablet Commonly known as: TYLENOL  PM Take 2 tablets by mouth at bedtime as needed (Sleep/Pain).   ezetimibe  10 MG tablet Commonly known as: ZETIA  Take 1 tablet by mouth daily.   fluticasone  50 MCG/ACT nasal spray Commonly known as: FLONASE  Place 2 sprays into both nostrils daily. What changed:  when to take this reasons to take this   gabapentin  300 MG capsule Commonly known as: NEURONTIN  Take 600 mg by mouth 2 (two) times daily.   glipiZIDE 5 MG tablet Commonly known as: GLUCOTROL Take 5 mg by mouth 2 (two) times daily before a meal.   hydrochlorothiazide  25 MG tablet Commonly known as: HYDRODIURIL  Take 1 tablet (25 mg total) by mouth daily.   IRON PO Take 1 tablet by mouth daily.   Januvia 50 MG tablet Generic drug: sitaGLIPtin Take 50 mg by mouth daily.   levothyroxine  75 MCG tablet Commonly known as: SYNTHROID  Take 1 tablet by mouth daily.   losartan  50 MG tablet Commonly known as: COZAAR  Take 50 mg by mouth daily.   magnesium  oxide 400 MG tablet Commonly known as: MAG-OX Take 400 mg by mouth daily.   meclizine 25 MG tablet Commonly known as: ANTIVERT Take 25 mg by mouth daily.   methocarbamol  500 MG tablet Commonly known as: ROBAXIN  Take  1 tablet (500 mg total) by mouth 2 (two) times daily as needed for muscle spasms.   mometasone 0.1 % cream Commonly known as: ELOCON Apply 1 Application topically daily as needed (Dermatitis).   omeprazole 40 MG capsule Commonly known as: PRILOSEC Take 40 mg by mouth daily.   ondansetron  4 MG tablet Commonly known as: ZOFRAN  Take 1 tablet (4 mg total) by mouth every 6 (six) hours as needed for nausea.   oxyCODONE  5 MG immediate release tablet Commonly known as: Oxy IR/ROXICODONE  Take 1 tablet (5 mg total) by mouth every 8 (eight) hours as needed for severe pain (pain score 7-10). What changed: when to take this   polycarbophil 625 MG tablet Commonly known as: FIBERCON Take 1 tablet (625 mg total) by mouth 2 (two) times daily.   polyethylene glycol powder 17 GM/SCOOP powder Commonly known as: GLYCOLAX /MIRALAX  Take 17 g by mouth 2 (two) times daily as needed for moderate constipation.   Vitamin D3 25 MCG (1000 UT) Caps Take 1,000 Units by mouth daily.  Discharge Care Instructions  (From admission, onward)           Start     Ordered   01/21/24 0000  Discharge wound care:       Comments: As directed by surgery   01/21/24 1401   01/17/24 0000  Discharge wound care:       Comments: It is good for closed incisions and even open wounds to be washed every day.  Shower every day.  Short baths are fine.  Wash the incisions and wounds clean with soap & water.    You may leave closed incisions open to air if it is dry.   You may cover the incision with clean gauze & replace it after your daily shower for comfort.  TEGADERM:  You have clear gauze band-aid dressings over your closed incision(s).  Remove the dressings 2 days after surgery = 5/21   01/17/24 1416            Follow-up Information     Maczis, Puja Gosai, PA-C. Go on 02/08/2024.   Specialty: General Surgery Why: 11:15 AM, please arrive 30 min prior to appointment time to check in. This is your  post op appointment after gallbladder removal. Contact information: [address] [place] [address] [facility] [place] [address] [phone]         Suzi Essex, MD. Schedule an appointment as soon as possible for a visit.   Specialty: Gynecologic Oncology Contact information: [address] [place] [address] [phone]                 Discharge Instructions     Call MD for:   Complete by: As directed    FEVER > 101.5 F  (temperatures < 101.5 F are not significant)   Call MD for:  extreme fatigue   Complete by: As directed    Call MD for:  persistant dizziness or light-headedness   Complete by: As directed    Call MD for:  persistant nausea and vomiting   Complete by: As directed    Call MD for:  redness, tenderness, or signs of infection (pain, swelling, redness, odor or green/yellow discharge around incision site)   Complete by: As directed    Call MD for:  severe uncontrolled pain   Complete by: As directed    Diet - low sodium heart healthy   Complete by: As directed    Start with a bland diet such as soups, liquids, starchy foods, low fat foods, etc. the first few days at home. Gradually advance to a solid, low-fat, high fiber diet by the end of the first week at home.   Add a fiber supplement to your diet (Metamucil, etc) If you feel full, bloated, or constipated, stay on a full liquid or pureed/blenderized diet for a few days until you feel better and are no longer constipated.   Discharge instructions   Complete by: As directed    See Discharge Instructions If you are not getting better after two weeks or are noticing you are getting worse, contact our office [phone] for further advice.  We may need to adjust your medications, re-evaluate you in the office, send you to the emergency room, or see what other things we can do to help. The clinic staff is available to answer your questions during regular business hours  (8:30am-5pm).  Please don't hesitate to call and ask to speak to one of our nurses for clinical concerns.  A surgeon from [facility] is always on call at the hospitals 24 hours/day If you have a medical emergency, go to the nearest emergency room or call 911.   Discharge instructions   Complete by: As directed    **IMPORTANT DISCHARGE INSTRUCTIONS**   From Dr. Darlyn Eke: You were admitted for gallstone pancreatitis.    Here, the stone was blocking the pancreas , but thankfully it passed  You then had cholecystectomy to try to minimize the chance this will happen again  Go see the General Surgeons as directed (you have an appointment with Puja Maczis, see in the To Do section below)  Go see the Gynecologist too (See Dr. Orvil Bland in below To Do section)  If you have nausea, take ondansetron  4 mg up to every 8 hours If you are taking more than twice per day, call your doctor  If you have pain, take oxycodone  5 mg up to three times per day Keep this stored in a secure place, away from children Do not drive while taking Do not take with other opiates like tramadol  or with alcohol  Go see your primary doctor in 1 week  To rest the pancreas, eat a bland diet Avoid greasy fatty foods (red meat,fried foods, cheeseburgers, deep fried anything) for the next few weeks until your symptoms are totally resolved  Stick to foods like bananas, applesauce, rice, toast, crackers, mashed potatoes, stewed vegetables, bread, pasta   Discharge wound care:   Complete by: As directed    It is good for closed incisions and even open wounds to be washed every day.  Shower every day.  Short baths are fine.  Wash the incisions and wounds clean with soap & water.    You may leave closed incisions open to air if it is dry.   You may cover the incision with clean gauze & replace it after your daily shower for comfort.  TEGADERM:  You have clear gauze band-aid dressings over your closed incision(s).   Remove the dressings 2 days after surgery = 5/21   Discharge wound care:   Complete by: As directed    As directed by surgery   Driving Restrictions   Complete by: As directed    You may drive when: - you are no longer taking narcotic prescription pain medication - you can comfortably wear a seatbelt - you can safely make sudden turns/stops without pain.   Increase activity slowly   Complete by: As directed    Start light daily activities --- self-care, walking, climbing stairs- beginning the day after surgery.  Gradually increase activities as tolerated.  Control your pain to be active.  Stop when you are tired.  Ideally, walk several times a day, eventually an hour a day.   Most people are back to most day-to-day activities in a few weeks.  It takes 4-6 weeks to get back to unrestricted, intense activity. If you can walk 30 minutes without difficulty, it is safe to try more intense activity such as jogging, treadmill, bicycling, low-impact aerobics, swimming, etc. Save the most intensive and strenuous activity for last (Usually 4-8 weeks after surgery) such as sit-ups, heavy lifting, contact sports, etc.  Refrain from any intense heavy lifting or straining until you are off narcotics for pain control.  You will have off days, but things should improve week-by-week. DO NOT PUSH THROUGH PAIN.  Let pain be your guide: If it hurts to do something, don't do it.   Increase activity slowly  Complete by: As directed    Lifting restrictions   Complete by: As directed    If you can walk 30 minutes without difficulty, it is safe to try more intense activity such as jogging, treadmill, bicycling, low-impact aerobics, swimming, etc. Save the most intensive and strenuous activity for last (Usually 4-8 weeks after surgery) such as sit-ups, heavy lifting, contact sports, etc.   Refrain from any intense heavy lifting or straining until you are off narcotics for pain control.  You will have off days, but  things should improve week-by-week. DO NOT PUSH THROUGH PAIN.  Let pain be your guide: If it hurts to do something, don't do it.  Pain is your body warning you to avoid that activity for another week until the pain goes down.   May shower / Bathe   Complete by: As directed    May walk up steps   Complete by: As directed    Remove dressing in 48 hours   Complete by: As directed    Make sure all dressings have been removed on the second day after surgery = 5/21 Wednesday Leave incisions open to air.  OK to cover incisions with gauze or bandages as desired   Sexual Activity Restrictions   Complete by: As directed    You may have sexual intercourse when it is comfortable. If it hurts to do something, stop.       Discharge Exam: Filed Weights   01/11/24 1042 01/17/24 0909  Weight: 71 kg 71 kg    General: Pt is alert, awake, not in acute distress Cardiovascular: RRR, nl S1-S2, no murmurs appreciated.   No LE edema.   Respiratory: Normal respiratory rate and rhythm.  CTAB without rales or wheezes. Abdominal: Abdomen soft and non-tender.  No distension or HSM.   Neuro/Psych: Strength symmetric in upper and lower extremities.  Judgment and insight appear normal.   Condition at discharge: good  The results of significant diagnostics from this hospitalization (including imaging, microbiology, ancillary and laboratory) are listed below for reference.   Imaging Studies: DG Cholangiogram Operative Result Date: 01/17/2024 CLINICAL DATA:  Intraoperative cholangiogram. EXAM: INTRAOPERATIVE CHOLANGIOGRAM TECHNIQUE: Cholangiographic images from the C-arm fluoroscopic device were submitted for interpretation post-operatively. Please see the procedural report for the amount of contrast and the fluoroscopy time utilized. FLUOROSCOPY: Radiation Exposure Index (as provided by the fluoroscopic device): 4.41 mGy COMPARISON:  Preoperative imaging FINDINGS: Single cine run provided during intraoperative  cholangiogram. Contrast injected into the distal cystic duct. There is opacification of the common bile duct, common hepatic duct and intrahepatic ducts. No visible filling defect or visible choledocholithiasis. Air bubbles are seen in the distal common bile duct. Contrast flows freely into the duodenum. Fluoroscopy time 8 seconds. IMPRESSION: Intraoperative cholangiogram without visible choledocholithiasis. Electronically Signed   By: Chadwick Colonel M.D.   On: 01/17/2024 17:40   CT ABDOMEN PELVIS W CONTRAST Result Date: 01/15/2024 CLINICAL DATA:  Pancreatitis, acute, severe pt with cholecystitis/pancreatitis and prior diverticulitis. worsening periumbilical pain EXAM: CT ABDOMEN AND PELVIS WITH CONTRAST TECHNIQUE: Multidetector CT imaging of the abdomen and pelvis was performed using the standard protocol following bolus administration of intravenous contrast. RADIATION DOSE REDUCTION: This exam was performed according to the departmental dose-optimization program which includes automated exposure control, adjustment of the mA and/or kV according to patient size and/or use of iterative reconstruction technique. CONTRAST:  OMNIPAQUE  IOHEXOL  300 MG/ML  SOLN COMPARISON:  01/11/2024 CT, 01/02/2024 CT, 12/07/2023 ultrasound FINDINGS: Lower chest: Bibasilar subsegmental atelectasis. No  acute pleural or parenchymal lung disease. Hepatobiliary: Increased globular high attenuation material within the gallbladder lumen, which could reflect blood products given significant increased since the 01/11/2024 exam and new appearance since 01/02/2024. This could reflect blood clot or tumefactive sludge. The pericholecystic fat stranding noted previously has improved in the interim. No biliary duct dilation. Liver is unremarkable. Pancreas: Progressive inflammatory changes are seen within the head and uncinate process of the pancreas consistent with acute pancreatitis. There is no evidence of fluid collection, pseudocyst,  or abscess at this time. No pancreatic duct dilation. Spleen: Normal in size without focal abnormality. Adrenals/Urinary Tract: Kidneys enhance normally and symmetrically. No urinary tract calculi or obstructive uropathy within either kidney. The adrenals are stable. Bladder is unremarkable. Stomach/Bowel: No bowel obstruction or ileus. Normal appendix right lower quadrant. Diffuse colonic diverticulosis with no evidence of acute diverticulitis. Mild wall thickening of the distal esophagus at the gastroesophageal junction, which could reflect sequela of reflux or esophagitis. Vascular/Lymphatic: Splenic vein, SMV, and portal vein are patent. Atherosclerosis of the abdominal aorta again noted. No pathologic adenopathy. Reproductive: Persistent fluid-filled endometrial cavity, measuring up to 3 cm in thickness, unchanged in appearance. Gynecologic consultation again recommended to exclude underlying cervical or endometrial cancer. No adnexal masses. Other: No free fluid or free intraperitoneal gas. No abdominal wall hernia. Musculoskeletal: No acute or destructive bony abnormalities. 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 Reconstructed images demonstrate no additional findings. IMPRESSION: 1. Acute pancreatitis, with increased edema and inflammatory changes in the pancreatic head and uncinate process since prior study. No fluid collection, pseudocyst, or abscess. 2. Increasing globular hyperdense material within the gallbladder lumen, differential would include blood clot versus tumefactive sludge. No evidence of acute cholecystitis on this exam. Short interval follow-up ultrasound or CT could be performed. 3. Mild wall thickening of the distal thoracic esophagus at the gastroesophageal junction, new since prior study, which could reflect esophagitis or sequela of reflux/emesis. 4. Diffuse colonic diverticulosis without diverticulitis. 5. Distended  fluid-filled endometrial cavity, unchanged since prior CT and ultrasound. Leading diagnostic consideration would be cervical mass or stenosis, and gynecologic follow-up is recommended. 6.  Aortic Atherosclerosis (ICD10-I70.0). Electronically Signed   By: Bobbye Burrow M.D.   On: 01/15/2024 12:52   US  ABDOMEN LIMITED RUQ (LIVER/GB) Result Date: 01/11/2024 CLINICAL DATA:  6216 Acute pancreatitis 6216 EXAM: ULTRASOUND ABDOMEN LIMITED RIGHT UPPER QUADRANT COMPARISON:  CT scan abdomen and pelvis from earlier the same day FINDINGS: The technologist noted limited exam due to body habitus, bowel gas and other patient related factors. Gallbladder: The gallbladder is physiologically distended. There is mild to moderate diffuse circumferential wall thickening. No gallstones or sludge seen. No pericholecystic free fluid. The technologist noted positive sonographic Murphy's sign. Common bile duct: Diameter: Up to 2.5 mm.  No intrahepatic bile duct dilation. Liver: There is poor sound beam penetration to the deep / posterior aspects of the liver as a result of increased hepatic echogenicity which reduces the sensitivity of ultrasound for the detection of focal masses. That being said, no focal mass is identified. Portal vein is patent on color Doppler imaging with normal direction of blood flow towards the liver. Other: None. IMPRESSION: 1. There is mild-to-moderate diffuse circumferential wall thickening of the gallbladder. No gallstones or sludge seen. No pericholecystic free fluid. The technologist noted positive sonographic Murphy's sign. Findings are equivocal for acute cholecystitis. If clinical suspicion is high, further evaluation with nuclear medicine HIDA scan is recommended. 2. Increased hepatic  echogenicity, a nonspecific finding that is most commonly seen on the basis of steatosis in the absence of known liver disease. 3. Otherwise unremarkable exam. Electronically Signed   By: Beula Brunswick M.D.   On:  01/11/2024 09:57   CT ABDOMEN PELVIS W CONTRAST Result Date: 01/11/2024 CLINICAL DATA:  Abdominal pain acute nonlocalized EXAM: CT ABDOMEN AND PELVIS WITH CONTRAST TECHNIQUE: Multidetector CT imaging of the abdomen and pelvis was performed using the standard protocol following bolus administration of intravenous contrast. RADIATION DOSE REDUCTION: This exam was performed according to the departmental dose-optimization program which includes automated exposure control, adjustment of the mA and/or kV according to patient size and/or use of iterative reconstruction technique. CONTRAST:  75mL OMNIPAQUE  IOHEXOL  300 MG/ML  SOLN COMPARISON:  Jan 02, 2024 FINDINGS: Lower chest: Chronic basilar hypoventilatory atelectatic changes with mild bronchial dilatation could correlate with chronic bronchitis. Hepatobiliary: Liver normal size shape position without parenchymal lesions or biliary dilatation gallbladder appears mildly distended with gallstones in the neck of the gallbladder and mild pericholecystic fluid which could correlate with a acute cholecystitis. Inflammatory changes extend from the neck of the gallbladder inferiorly and surrounds the second portion of the duodenal. Small amount of fluid along the anterior lateral paracolic fossa stents inferiorly into the lower abdomen. The pancreas appears also slightly enlarged prominent along the head of the pancreas with a small amount of fluid surrounding the inferior margin of the body of the pancreas findings that correlate also with pancreatitis. Gallbladder ultrasound as well as biliary study recommended for evaluation of gallbladder exclude acute cholecystitis. No obvious choledocholithiasis noted on these images. No drainable peripancreatic fluid collections no pseudocyst formation Spleen: Normal in size without focal abnormality. Adrenals/Urinary Tract: Adrenal glands are unremarkable. Kidneys are normal, without renal calculi, focal lesion, or hydronephrosis.  Bladder is unremarkable. Stomach/Bowel: Stomach is within normal limits. Appendix appears normal. No evidence of bowel wall thickening, distention, or inflammatory changes. Diffuse diverticulosis of the descending colon without diverticulitis Vascular/Lymphatic: Aortic atherosclerosis. No enlarged abdominal or pelvic lymph nodes. Reproductive: The uterus demonstrates a hypodense mass in the central portion of the uterus a 3.2 by 3.3 by 4.1 cm which could correlate with a intramural fibroid. Other: No abdominal wall hernia or abnormality. No abdominopelvic ascites. Musculoskeletal: No fracture is seen.  Prior right hip arthroplasty IMPRESSION: *Findings consistent with acute cholecystitis and pancreatitis. Gallbladder ultrasound as well as biliary study recommended for evaluation of gallbladder exclude acute cholecystitis. *No obvious choledocholithiasis noted on these images. *No drainable peripancreatic fluid collections or pseudocyst formation. *Diverticulosis of the descending colon without diverticulitis. *Uterine fibroid. *Chronic basilar hypoventilatory atelectatic changes with mild bronchial dilatation could correlate with chronic bronchitis. *Aortic atherosclerosis. Electronically Signed   By: Fredrich Jefferson M.D.   On: 01/11/2024 07:38   CT ABDOMEN PELVIS W CONTRAST Result Date: 01/02/2024 CLINICAL DATA:  Diffuse abdominal pain and nausea for several days. EXAM: CT ABDOMEN AND PELVIS WITH CONTRAST TECHNIQUE: Multidetector CT imaging of the abdomen and pelvis was performed using the standard protocol following bolus administration of intravenous contrast. RADIATION DOSE REDUCTION: This exam was performed according to the departmental dose-optimization program which includes automated exposure control, adjustment of the mA and/or kV according to patient size and/or use of iterative reconstruction technique. CONTRAST:  60mL OMNIPAQUE  IOHEXOL  300 MG/ML  SOLN COMPARISON:  12/07/2023 FINDINGS: Lower Chest: No  acute findings. Hepatobiliary: No suspicious hepatic masses identified. Gallbladder is unremarkable. No evidence of biliary ductal dilatation. Pancreas:  No mass or inflammatory changes. Spleen: Within normal  limits in size and appearance. Adrenals/Urinary Tract: No suspicious masses identified. No evidence of ureteral calculi or hydronephrosis. Bladder is poorly visualized due to severe beam hardening artifact from left hip prosthesis. Stomach/Bowel: Severe diffuse colonic diverticulosis is again seen. Mild diverticulitis is seen near the junction of the descending and sigmoid colon. No evidence of perforation or abscess. Vascular/Lymphatic: No pathologically enlarged lymph nodes. No acute vascular findings. Retroaortic left renal vein again noted. Reproductive: Diffuse distension of the endometrial cavity is again seen which may be due to diffuse endometrial thickening or hydrometros. Adnexal regions are unremarkable. Other:  None. Musculoskeletal: No suspicious bone lesions identified. 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 IMPRESSION: Mild diverticulitis near the junction of the descending and sigmoid colon. No evidence of perforation or abscess. Distension of endometrial cavity again seen, which may be due to diffuse endometrial thickening or hydrometros. Endometrial carcinoma cannot be excluded in a postmenopausal female. GYN consultation is recommended. Electronically Signed   By: Marlyce Sine M.D.   On: 01/02/2024 16:18    Microbiology: Results for orders placed or performed during the hospital encounter of 03/06/16  Urine culture     Status: Abnormal   Collection Time: 03/06/16 10:15 AM   Specimen: Urine, Clean Catch  Result Value Ref Range Status   Specimen Description URINE, CLEAN CATCH  Final   Special Requests NONE  Final   Culture 10,000 COLONIES/mL YEAST (A)  Final   Report Status 03/07/2016 FINAL  Final  Wet prep, genital     Status:  Abnormal   Collection Time: 03/06/16 12:35 PM   Specimen: Cervical/Vaginal swab; Genital  Result Value Ref Range Status   Yeast Wet Prep HPF POC PRESENT (A) NONE SEEN Final   Trich, Wet Prep PRESENT (A) NONE SEEN Final   Clue Cells Wet Prep HPF POC NONE SEEN NONE SEEN Final   WBC, Wet Prep HPF POC MANY (A) NONE SEEN Final   Sperm NONE SEEN  Final    Labs: CBC: Recent Labs  Lab 01/15/24 0947 01/17/24 0832 01/18/24 0442 01/19/24 0451 01/20/24 0455 01/21/24 0404  WBC 9.1 8.1 7.3 6.7 7.3 6.6  NEUTROABS 6.7  --   --   --   --   --   HGB 10.8* 11.2* 10.7* 10.8* 11.9* 10.7*  HCT 32.1* 33.3* 31.9* 32.3* 36.6 33.6*  MCV 89.9 91.0 90.6 90.5 91.7 94.6  PLT 453* 485* 453* 505* 551* 469*   Basic Metabolic Panel: Recent Labs  Lab 01/17/24 0832 01/18/24 0442 01/19/24 0451 01/20/24 0455 01/21/24 0404  NA 132* 133* 131* 131* 136  K 3.6 3.3* 3.7 3.7 3.6  CL 94* 99 99 98 106  CO2 25 23 23 24 22   GLUCOSE 88 88 84 94 86  BUN 14 13 14 18 19   CREATININE 1.17* 1.12* 1.14* 1.31* 1.11*  CALCIUM  9.1 8.8* 8.5* 8.8* 8.3*  PHOS  --   --  2.3* 2.9 2.6   Liver Function Tests: Recent Labs  Lab 01/15/24 0947 01/17/24 0832 01/18/24 0442 01/19/24 0451 01/20/24 0455 01/21/24 0404  AST 14* 14* 36  --  22  --   ALT 24 17 22   --  18  --   ALKPHOS 90 83 70  --  76  --   BILITOT 0.9 1.0 0.5  --  0.3  --   PROT 6.6 6.8 6.1*  --  6.6  --   ALBUMIN 2.7* 2.8* 2.6* 2.7* 2.9*  2.9*  2.5*   CBG: Recent Labs  Lab 01/20/24 1954 01/20/24 2333 01/21/24 0011 01/21/24 0410 01/21/24 0830  GLUCAP 133* 106* 104* 91 91    Discharge time spent: approximately 45 minutes spent on discharge counseling, evaluation of patient on day of discharge, and coordination of discharge planning with nursing, social work, pharmacy and case management  Signed: Ephriam Hashimoto, MD [facility] 01/21/2024

## 2024-01-21 NOTE — Plan of Care (Signed)

## 2024-01-21 NOTE — Progress Notes (Addendum)
 discharge instructions given to patient with teach back method, midline removed without any complications, pt tolerated well, reviewed medications and follow up appointments, pt educated on blood pressure and resuming medications at home, pt had all belongings, All questions asked and answered. Pt verbalized understanding of discharge. pt discahrged off unit in wheelchair, discharged home with daughter.

## 2024-01-21 NOTE — Progress Notes (Signed)
 Progress Note  4 Days Post-Op  Subjective: Pt appears much more comfortable this AM. 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  Lungs: Respiratory effort nonlabored Abd: soft, appropriately ttp, ND, incisions C/D/I Psych: A&Ox3 with an appropriate affect.    Lab Results:  Recent Labs    01/20/24 0455 01/21/24 0404  WBC 7.3 6.6  HGB 11.9* 10.7*  HCT 36.6 33.6*  PLT 551* 469*   BMET Recent Labs    01/20/24 0455 01/21/24 0404  NA 131* 136  K 3.7 3.6  CL 98 106  CO2 24 22  GLUCOSE 94 86  BUN 18 19  CREATININE 1.31* 1.11*  CALCIUM  8.8* 8.3*   PT/INR No results for input(s): "LABPROT", "INR" in the last 72 hours. CMP     Component Value Date/Time   NA 136 01/21/2024 0404   K 3.6 01/21/2024 0404   CL 106 01/21/2024 0404   CO2 22 01/21/2024 0404   GLUCOSE 86 01/21/2024 0404   BUN 19 01/21/2024 0404   CREATININE 1.11 (H) 01/21/2024 0404   CALCIUM  8.3 (L) 01/21/2024 0404   PROT 6.6 01/20/2024 0455   ALBUMIN 2.5 (L) 01/21/2024 0404   AST 22 01/20/2024 0455   ALT 18 01/20/2024 0455   ALKPHOS 76 01/20/2024 0455   BILITOT 0.3 01/20/2024 0455   GFRNONAA 52 (L) 01/21/2024 0404   GFRAA 51 (L) 03/08/2016 1618   Lipase     Component Value Date/Time   LIPASE 295 (H) 01/21/2024 0404       Studies/Results: No  results found.   Anti-infectives: Anti-infectives (From admission, onward)    Start     Dose/Rate Route Frequency Ordered Stop   01/17/24 1215  cefTRIAXone  (ROCEPHIN ) 2 g in sodium chloride  0.9 % 100 mL IVPB  Status:  Discontinued        2 g 200 mL/hr over 30 Minutes Intravenous On call to O.R. 01/17/24 1203 01/17/24 1545   01/11/24 2200  metroNIDAZOLE  (FLAGYL ) IVPB 500 mg  Status:  Discontinued       Placed in "And" Linked Group   500 mg 100 mL/hr over 60 Minutes Intravenous Every 12 hours 01/11/24 0943 01/18/24 1158   01/11/24 1300  doxycycline  (VIBRA -TABS) tablet 100 mg        100 mg Oral Every 12 hours 01/11/24 0937 01/19/24 2359   01/11/24 1200  cefTRIAXone  (ROCEPHIN ) 2 g in sodium chloride  0.9 % 100 mL IVPB  Status:  Discontinued        2 g 200 mL/hr over 30 Minutes Intravenous Daily 01/11/24 1058 01/18/24 1158   01/11/24 0945  cefTRIAXone  (ROCEPHIN ) 2 g in sodium chloride  0.9 % 100 mL IVPB  Status:  Discontinued        2 g 200 mL/hr over 30 Minutes Intravenous  Once 01/11/24 0943 01/11/24 1057  01/11/24 0945  ceFEPIme  (MAXIPIME ) 2 g in sodium chloride  0.9 % 100 mL IVPB  Status:  Discontinued       Placed in "And" Linked Group   2 g 200 mL/hr over 30 Minutes Intravenous Every 24 hours 01/11/24 0943 01/11/24 0944   01/11/24 0630  ceFEPIme  (MAXIPIME ) 2 g in sodium chloride  0.9 % 100 mL IVPB       Placed in "And" Linked Group   2 g 200 mL/hr over 30 Minutes Intravenous  Once 01/11/24 0622 01/11/24 0822   01/11/24 0630  metroNIDAZOLE  (FLAGYL ) IVPB 500 mg       Placed in "And" Linked Group   500 mg 100 mL/hr over 60 Minutes Intravenous  Once 01/11/24 0622 01/11/24 1017        Assessment/Plan  Biliary pancreatitis Acute cholecystitis  POD#4 s/p laparoscopic cholecystectomy with IOC Dr. Hershell Lose  - AFVSS, WBC 6.6, Hgb stable - lipase mildly elevated but clinically appears much better this AM - stable for DC from surgery perspective, follow up and instructions in AVS   FEN:  soft, IVF per [facility] VTE: LMWH ID: PO doxy     - per [facility] -  HTN HLD T2DM Recent uterine perforation s/p repair 5/8 by Dr. Vallarie Gauze       LOS: 10 days     Alexandria Durham, [facility] 01/21/2024, 11:15 AM Please see Amion for pager number during day hours 7:00am-4:30pm

## 2024-01-25 ENCOUNTER — Ambulatory Visit: Payer: Self-pay | Admitting: Psychiatry

## 2024-01-25 ENCOUNTER — Telehealth: Payer: Self-pay | Admitting: *Deleted

## 2024-01-25 LAB — GLUCOSE, CAPILLARY
Glucose-Capillary: 144 mg/dL — ABNORMAL HIGH (ref 70–99)
Glucose-Capillary: 112 mg/dL — ABNORMAL HIGH (ref 70–99)

## 2024-01-25 NOTE — Telephone Encounter (Signed)
 Spoke with patient and relayed message from Vira Grieves, NP that the sampling from the uterus at her recent gallbladder surgery did not show any endometrial tissue, just had scant fragments of the cervix. She will need to follow up in the office to discuss next steps. Advised patient once we have an open appt. On the schedule the office would call back. Pt verbalized understanding and thanked the office.

## 2024-01-25 NOTE — Telephone Encounter (Signed)
-----   Message from Suellyn Emory sent at 01/25/2024  9:06 AM EDT ----- Bernis Brisker please call the patient and let her know the sampling from the uterus at her recent gallbladder surgery did not show any endometrial tissue, just had scant fragments of the cervix. She will need follow up in the office to discuss next steps.  We will need to see her in the office for follow up. She will need prob a 30 min spot if we can find it or worse case can use a 15 on [address]. Will look at her schedule.

## 2024-01-27 ENCOUNTER — Encounter: Payer: Self-pay | Admitting: Oncology

## 2024-01-27 NOTE — Progress Notes (Signed)
 Hi April - probably needs to be before. If Newton can't see her before then, I will see her in an add-on or as a 5th "new" patient. Thanks!

## 2024-01-28 NOTE — Telephone Encounter (Signed)
 Spoke with Adriana Hopping - since I saw her she had repeat D&C as well as L/S chole. Reviewed her pathology with her from Dr. Ona Bidding surgery. Encouraged her to keep appt on 6/2 as well as ultrasound.   [name], MD Attending Obstetrician & Gynecologist, [facility] for [facility], [facility]

## 2024-01-31 ENCOUNTER — Inpatient Hospital Stay: Attending: Psychiatry | Admitting: Psychiatry

## 2024-01-31 ENCOUNTER — Encounter: Payer: Self-pay | Admitting: Psychiatry

## 2024-01-31 ENCOUNTER — Inpatient Hospital Stay: Admitting: Gynecologic Oncology

## 2024-01-31 ENCOUNTER — Ambulatory Visit (HOSPITAL_COMMUNITY)
Admission: RE | Admit: 2024-01-31 | Discharge: 2024-01-31 | Disposition: A | Discharge status: Home / Self Care | Source: Ambulatory Visit | Attending: Obstetrics and Gynecology | Admitting: Obstetrics and Gynecology

## 2024-01-31 VITALS — BP 115/63 | HR 100 | Temp 97.7°F | Resp 16 | Ht 64.0 in | Wt 147.8 lb

## 2024-01-31 DIAGNOSIS — N950 Postmenopausal bleeding: Secondary | ICD-10-CM | POA: Diagnosis not present

## 2024-01-31 DIAGNOSIS — R9389 Abnormal findings on diagnostic imaging of other specified body structures: Secondary | ICD-10-CM | POA: Insufficient documentation

## 2024-01-31 DIAGNOSIS — S3769XD Other injury of uterus, subsequent encounter: Secondary | ICD-10-CM | POA: Insufficient documentation

## 2024-01-31 NOTE — Patient Instructions (Signed)
 Preparing for your Surgery   Plan for surgery on February 29, 2024 with Dr. Derrel Flies at [facility]. You will be scheduled for dilation and curettage of the uterus with hysteroscopy and myosure, possible US  guidance.    Pre-operative Testing -You will receive a phone call from presurgical testing at [facility] to discuss surgery instructions and arrange for lab work if needed.   -Bring your insurance card, copy of an advanced directive if applicable, medication list.   -You should not be taking blood thinners or aspirin at least ten days prior to surgery unless instructed by your surgeon.   -Do not take supplements such as fish oil (omega 3), red yeast rice, turmeric before your surgery. You want to avoid medications with aspirin in them including headache powders such as BC or Goody's), Excedrin migraine.   Day Before Surgery at Home -You will be advised you can have clear liquids up until 3 hours before your surgery.     Your role in recovery Your role is to become active as soon as directed by your doctor, while still giving yourself time to heal.  Rest when you feel tired. You will be asked to do the following in order to speed your recovery:   - Cough and breathe deeply. This helps to clear and expand your lungs and can prevent pneumonia after surgery.  - STAY ACTIVE WHEN YOU GET HOME. Do mild physical activity. Walking or moving your legs help your circulation and body functions return to normal. Do not try to get up or walk alone the first time after surgery.   -If you develop swelling on one leg or the other, pain in the back of your leg, redness/warmth in one of your legs, please call the office or go to the Emergency Room to have a doppler to rule out a blood clot. For shortness of breath, chest pain-seek care in the Emergency Room as soon as possible. - Actively manage your pain. Managing your pain lets you move in comfort. We will ask you to rate your pain on a  scale of zero to 10. It is your responsibility to tell your doctor or nurse where and how much you hurt so your pain can be treated.   Special Considerations -Your final pathology results from surgery should be available around one week after surgery and the results will be relayed to you when available.   -FMLA forms can be faxed to [phone] and please allow 5-7 business days for completion.   Pain Management After Surgery -Make sure that you have Tylenol  and Ibuprofen at home IF YOU ARE ABLE TO TAKE THESE MEDICATIONS to use on a regular basis after surgery for pain control.     Bowel Regimen -It is important to prevent constipation and drink adequate amounts of liquids. You can use a laxative as needed for constipation.   Risks of Surgery Risks of surgery are low but include bleeding, infection, damage to surrounding structures, re-operation, blood clots, and very rarely death.   AFTER SURGERY INSTRUCTIONS   Return to work:  1-2 days if applicable   Activity: 1. Be up and out of the bed during the day.  Take a nap if needed.  You may walk up steps but be careful and use the hand rail.  Stair climbing will tire you more than you think, you may need to stop part way and rest.    2. No lifting or straining for 2 weeks over 10  pounds. No pushing, pulling, straining for 2 weeks.   3. No driving for minimum 24 hours after surgery.  Do not drive if you are taking narcotic pain medicine and make sure that your reaction time has returned.    4. You can shower as soon as the next day after surgery. Shower daily. No tub baths or submerging your body in water until cleared by your surgeon (2 weeks minimum). If you have the soap that was given to you by pre-surgical testing that was used before surgery, you do not need to use it afterwards because this can irritate your incisions.    5. No sexual activity and nothing in the vagina for 2 weeks.   6. You may experience vaginal spotting and  discharge after surgery.  The spotting is normal but if you experience heavy bleeding, call our office.   7. Take Tylenol  and ibuprofen for pain if you are able to take these medications for discomfort as needed.     Diet: 1. Low sodium Heart Healthy Diet is recommended but you are cleared to resume your normal (before surgery) diet after your procedure.   2. It is safe to use a laxative, such as Miralax  or Colace, if you have difficulty moving your bowels.    Wound Care: 1. Keep clean and dry.  Shower daily.   Reasons to call the Doctor: Fever - Oral temperature greater than 100.4 degrees Fahrenheit Foul-smelling vaginal discharge Difficulty urinating Nausea and vomiting Increased pain at the site of the incision that is unrelieved with pain medicine. Difficulty breathing with or without chest pain New calf pain especially if only on one side Sudden, continuing increased vaginal bleeding with or without clots.   Contacts: For questions or concerns you should contact:   Dr. Derrel Flies at [phone]   Alexandria Grieves, NP at [phone]   After Hours: call [phone] and have the GYN Oncologist paged/contacted (after 5 pm or on the weekends).   Messages sent via mychart are for non-urgent matters and are not responded to after hours so for urgent needs, please call the after hours number.

## 2024-01-31 NOTE — Progress Notes (Signed)
 Gynecologic Oncology Return Clinic Visit  Date of Service: 01/31/2024 Referring Provider: [name], MD  Assessment & Plan: Alexandria Durham is a 78 y.o. woman with thickened endometrium with inadequate sampling who presents for follow-up and treatment discussion.  Reviewed patient's surgical history to date.  Has had inadequate sampling in the operating room.  Discussed consideration of 1 final attempt at sampling, again with hysteroscopy to see if able to drain fluid from uterus to obtain adequate sampling.  We would plan to do this under ultrasound guidance given prior episode of perforation, although last D&C without hysteroscopy was observed at time of laparoscopy with no perforation occurring.  Reviewed with patient that if this attempt is also unsuccessful the next step would be hysterectomy.  Reviewed possible reasons for distended uterus and blood in the uterus could include a benign, malignant, or premalignant process.  Discussed that hysterectomy may be indicated in the future pending pathology results, but if benign we could observe.  Patient has a pelvic ultrasound later today.  Okay with continue with this ultrasound is scheduled.  Patient was consented for: Hysteroscopy, dilation and curettage on 02/29/2024.  The risks of surgery were discussed in detail and she understands these to including but not limited to bleeding requiring a blood transfusion, infection, injury to adjacent organs (including but not limited to the bowels, bladder, ureters, nerves, blood vessels), unforseen complication, and possible need for re-exploration.  If the patient experiences any of these events, she understands that her hospitalization or recovery may be prolonged and that she may need to take additional medications for a prolonged period. The patient will receive DVT and antibiotic prophylaxis as indicated. She voiced a clear understanding. She had the opportunity to ask questions and informed consent  was obtained today. She wishes to proceed.  She does not require preoperative clearance.  All preoperative instructions were reviewed. Postoperative expectations were also reviewed. Written handouts were provided to the patient.   RTC postop.  Derrel Flies, MD Gynecologic Oncology   Medical Decision Making I personally spent  TOTAL 40 minutes face-to-face and non-face-to-face in the care of this patient, which includes all pre, intra, and post visit time on the date of service.   ----------------------- Reason for Visit: Distended endometrium, PMB  Treatment History: Patient was initially admitted to the hospital 01/02/2024 for lower abdominal pain.  She was seen in consultation by Dr. Vallarie Gauze. She was noted to have had an EMB on 4/29 due to thickened endometrial stripe seen by CT through [facility]. Her EMB was negative for hyperplasia and malignancy.  She had also previously undergone a pelvic ultrasound on 12/07/2023 which showed 2.6 cm lining with diffuse distention of the endometrial cavity with internal echoes possibly due to cervical stenosis.  Patient denied postmenopausal bleeding, and there was no report of cervical stenosis at time of the EMB.  She then came to [facility] with pain on 5/4 and was admitted. She was started on Ceftriaxone  and Flagyl  due to mild diverticulitis on CT imaging through [facility] and initially improved but then started to worsen again during this hospital admission despite antibiotics. She was seen by general surgery team who also recommended second opinion by GYN.   On 01/06/2024 patient underwent a hysteroscopy, D&C, Pap smear, diagnostic laparoscopy.  During hysteroscopy, D&C, there was note of uterine perforation.  Laparoscopy was performed with small defect noted in anterior left side of the uterus with very little bleeding and no evidence of bowel injury or trauma.  Pap smear  returned nondiagnostic.  The curetting showed rare atypical squamous epithelium  consistent with L SIL and no definite endometrial tissue on endometrial curettings.  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  Patient was subsequently readmitted to the hospital 01/11/2024 for concern of pancreatitis and cholecystitis.  She went to the operating room on 01/17/2024 for laparoscopic cholecystectomy.  Given patient was going to the OR, I was asked to repeat the D&C at time of laparoscopy to see if able to obtain sampling of the lining given inadequate sampling previously.  Predominantly blood was removed during curettage.  Final pathology was again inadequate with scant fragments of benign cervical mucosa and blood and fibrin.  Interval History: [facility] today with her son.  Reports that she has had decreased appetite and no energy since discharge from the hospital.  Tolerating soft and liquid things like chicken broth, sherbet.  Has occasional nausea but no vomiting.  Having a bowel movement about every 2 days or so and passing some gas.  Has follow-up with [facility] on 02/08/2024.  No longer needing pain meds.  Reports that she has had some pink discharge/drainage while in the hospital and at home but this is getting less over time.  Currently staying with her daughter but hoping to get back to her home at some point.  Previously was living independently.  Also reports that she believes she had crier procedure on her cervix over 20 years ago.  Does not believe that she had anything abnormal since then but also not sure if she had Pap smear since then.    Past Medical/Surgical History: Past Medical History:  Diagnosis Date   Arthritis    Diabetes mellitus    Hypercholesteremia    Hypertension     Past Surgical History:  Procedure Laterality Date   BREAST CYST EXCISION     CHOLECYSTECTOMY N/A 01/17/2024   Procedure: LAPAROSCOPIC CHOLECYSTECTOMY WITH INTRAOPERATIVE CHOLANGIOGRAM;  Surgeon: Candyce Champagne, MD;  Location: [facility];  Service: General;  Laterality: N/A;    DILATION AND CURETTAGE OF UTERUS N/A 01/17/2024   Procedure: DILATION AND CURETTAGE;  Surgeon: Derrel Flies, MD;  Location: [facility];  Service: Gynecology;  Laterality: N/A;   HEMORRHOID SURGERY     HIP SURGERY     HYSTEROSCOPY WITH D & C N/A 01/06/2024   Procedure: DILATATION AND CURETTAGE /HYSTEROSCOPY;  Surgeon: [name], MD;  Location: [facility];  Service: Gynecology;  Laterality: N/A;  PAP SMEAR (DR TO BRING SUPPLIES)  TUCKERS CARD   JOINT REPLACEMENT     hip replacement   LAPAROSCOPIC LYSIS OF ADHESIONS  01/06/2024   Procedure: LYSIS, ADHESIONS, LAPAROSCOPIC;  Surgeon: [name], MD;  Location: [facility];  Service: Gynecology;;   LAPAROSCOPY N/A 01/06/2024   Procedure: LAPAROSCOPY, DIAGNOSTIC;  Surgeon: [name], MD;  Location: [facility];  Service: Gynecology;  Laterality: N/A;   THYROID SURGERY     TONSILLECTOMY     TUBAL LIGATION      Family History  Problem Relation Age of Onset   Breast cancer Neg Hx    Prostate cancer Neg Hx    Pancreatic cancer Neg Hx    Colon cancer Neg Hx    Endometrial cancer Neg Hx    Ovarian cancer Neg Hx     Social History   Socioeconomic History   Marital status: Single    Spouse name: Not on file   Number of children: Not on file   Years of education: Not on file   Highest  education level: Not on file  Occupational History   Not on file  Tobacco Use   Smoking status: Every Day    Current packs/day: 0.50    Types: Cigarettes    Passive exposure: Never   Smokeless tobacco: Never  Substance and Sexual Activity   Alcohol use: No   Drug use: No   Sexual activity: Not on file  Other Topics Concern   Not on file  Social History Narrative   Not on file   Social Drivers of Health   Financial Resource Strain: Not on file  Food Insecurity: No Food Insecurity (01/11/2024)   Hunger Vital Sign    Worried About Running Out of Food in the Last Year: Never true    Ran Out of Food in the Last Year: Never true  Transportation  Needs: No Transportation Needs (01/11/2024)   PRAPARE - Administrator, Civil Service (Medical): No    Lack of Transportation (Non-Medical): No  Physical Activity: Not on file  Stress: Not on file  Social Connections: Unknown (01/11/2024)   Social Connection and Isolation Panel [NHANES]    Frequency of Communication with Friends and Family: Twice a week    Frequency of Social Gatherings with Friends and Family: Twice a week    Attends Religious Services: 1 to 4 times per year    Active Member of [employer] or Organizations: No    Attends Engineer, structural: 1 to 4 times per year    Marital Status: Patient declined    Current Medications:  Current Outpatient Medications:    acetaminophen  (TYLENOL ) 500 MG tablet, Take 1,000 mg by mouth every 6 (six) hours as needed for moderate pain., Disp: , Rfl:    ammonium lactate (AMLACTIN) 12 % cream, Apply 1 Application topically as needed for dry skin., Disp: , Rfl:    bisacodyl  5 MG EC tablet, Take 5 mg by mouth daily as needed for moderate constipation., Disp: , Rfl:    buPROPion  (WELLBUTRIN  XL) 150 MG 24 hr tablet, Take 150 mg by mouth every evening., Disp: , Rfl:    Cholecalciferol  (VITAMIN D3) 25 MCG (1000 UT) CAPS, Take 1,000 Units by mouth daily., Disp: , Rfl:    diphenhydramine -acetaminophen  (TYLENOL  PM) 25-500 MG TABS tablet, Take 2 tablets by mouth at bedtime as needed (Sleep/Pain)., Disp: , Rfl:    ezetimibe  (ZETIA ) 10 MG tablet, Take 1 tablet by mouth daily., Disp: , Rfl:    Ferrous Sulfate (IRON PO), Take 1 tablet by mouth daily., Disp: , Rfl:    fluticasone  (FLONASE ) 50 MCG/ACT nasal spray, Place 2 sprays into both nostrils daily. (Patient taking differently: Place 2 sprays into both nostrils daily as needed for rhinitis.), Disp: 16 g, Rfl: 2   gabapentin  (NEURONTIN ) 300 MG capsule, Take 600 mg by mouth 2 (two) times daily., Disp: , Rfl:    glipiZIDE (GLUCOTROL) 5 MG tablet, Take 5 mg by mouth 2 (two) times daily before  a meal., Disp: , Rfl:    hydrochlorothiazide  (HYDRODIURIL ) 25 MG tablet, Take 1 tablet (25 mg total) by mouth daily., Disp: 30 tablet, Rfl: 0   levothyroxine  (SYNTHROID ) 75 MCG tablet, Take 1 tablet by mouth daily., Disp: , Rfl:    losartan  (COZAAR ) 50 MG tablet, Take 50 mg by mouth daily., Disp: , Rfl:    magnesium  oxide (MAG-OX) 400 MG tablet, Take 400 mg by mouth daily., Disp: , Rfl:    meclizine (ANTIVERT) 25 MG tablet, Take 25 mg by mouth daily.,  Disp: , Rfl:    methocarbamol  (ROBAXIN ) 500 MG tablet, Take 1 tablet (500 mg total) by mouth 2 (two) times daily as needed for muscle spasms., Disp: 14 tablet, Rfl: 0   mometasone (ELOCON) 0.1 % cream, Apply 1 Application topically daily as needed (Dermatitis)., Disp: , Rfl:    omeprazole (PRILOSEC) 40 MG capsule, Take 40 mg by mouth daily., Disp: , Rfl:    ondansetron  (ZOFRAN ) 4 MG tablet, Take 1 tablet (4 mg total) by mouth every 6 (six) hours as needed for nausea., Disp: 20 tablet, Rfl: 0   oxyCODONE  (OXY IR/ROXICODONE ) 5 MG immediate release tablet, Take 1 tablet (5 mg total) by mouth every 8 (eight) hours as needed for severe pain (pain score 7-10)., Disp: 12 tablet, Rfl: 0   polycarbophil (FIBERCON) 625 MG tablet, Take 1 tablet (625 mg total) by mouth 2 (two) times daily. (Patient not taking: Reported on 01/11/2024), Disp: 60 tablet, Rfl: 0   polyethylene glycol powder (GLYCOLAX /MIRALAX ) 17 GM/SCOOP powder, Take 17 g by mouth 2 (two) times daily as needed for moderate constipation., Disp: , Rfl:    sitaGLIPtin (JANUVIA) 50 MG tablet, Take 50 mg by mouth daily., Disp: , Rfl:   Review of Symptoms: Complete 10-system review is positive for: Appetite changes, fatigue, vaginal bleeding, depression, vaginal discharge, problem with walking  Physical Exam: BP 115/63 (BP Location: Left Arm, Patient Position: Sitting)   Pulse 100   Temp 97.7 F (36.5 C) (Oral)   Resp 16   Ht 5\' 4"  (1.626 m)   Wt 147 lb 12.8 oz (67 kg)   SpO2 100%   BMI 25.37 kg/m   General: Alert, oriented, no acute distress. HEENT: Normocephalic, atraumatic. Neck symmetric without masses. Sclera anicteric. ar. Chest: Normal work of breathing. Clear to auscultation bilaterally. Cardiovascular: Regular rate and rhythm, no murmurs. Abdomen: Soft, nontender.  Normoactive bowel sounds.  No masses appreciated.  Well-healed incisions. Extremities: Grossly normal range of motion.  Warm, well perfused.  No edema bilaterally. Skin: No rashes or lesions noted.  Laboratory & Radiologic Studies: Surgical pathology (01/17/24): A. ENDOMETRIAL CURETTINGS:  - Scant fragments of benign cervical mucosa and stroma  - Blood and fibrin

## 2024-01-31 NOTE — H&P (View-Only) (Signed)
 Gynecologic Oncology Return Clinic Visit  Date of Service: 01/31/2024 Referring Provider: [name], MD  Assessment & Plan: Alexandria Durham is a 78 y.o. woman with thickened endometrium with inadequate sampling who presents for follow-up and treatment discussion.  Reviewed patient's surgical history to date.  Has had inadequate sampling in the operating room.  Discussed consideration of 1 final attempt at sampling, again with hysteroscopy to see if able to drain fluid from uterus to obtain adequate sampling.  We would plan to do this under ultrasound guidance given prior episode of perforation, although last D&C without hysteroscopy was observed at time of laparoscopy with no perforation occurring.  Reviewed with patient that if this attempt is also unsuccessful the next step would be hysterectomy.  Reviewed possible reasons for distended uterus and blood in the uterus could include a benign, malignant, or premalignant process.  Discussed that hysterectomy may be indicated in the future pending pathology results, but if benign we could observe.  Patient has a pelvic ultrasound later today.  Okay with continue with this ultrasound is scheduled.  Patient was consented for: Hysteroscopy, dilation and curettage on 02/29/2024.  The risks of surgery were discussed in detail and she understands these to including but not limited to bleeding requiring a blood transfusion, infection, injury to adjacent organs (including but not limited to the bowels, bladder, ureters, nerves, blood vessels), unforseen complication, and possible need for re-exploration.  If the patient experiences any of these events, she understands that her hospitalization or recovery may be prolonged and that she may need to take additional medications for a prolonged period. The patient will receive DVT and antibiotic prophylaxis as indicated. She voiced a clear understanding. She had the opportunity to ask questions and informed consent  was obtained today. She wishes to proceed.  She does not require preoperative clearance.  All preoperative instructions were reviewed. Postoperative expectations were also reviewed. Written handouts were provided to the patient.   RTC postop.  Derrel Flies, MD Gynecologic Oncology   Medical Decision Making I personally spent  TOTAL 40 minutes face-to-face and non-face-to-face in the care of this patient, which includes all pre, intra, and post visit time on the date of service.   ----------------------- Reason for Visit: Distended endometrium, PMB  Treatment History: Patient was initially admitted to the hospital 01/02/2024 for lower abdominal pain.  She was seen in consultation by Dr. Vallarie Gauze. She was noted to have had an EMB on 4/29 due to thickened endometrial stripe seen by CT through [facility]. Her EMB was negative for hyperplasia and malignancy.  She had also previously undergone a pelvic ultrasound on 12/07/2023 which showed 2.6 cm lining with diffuse distention of the endometrial cavity with internal echoes possibly due to cervical stenosis.  Patient denied postmenopausal bleeding, and there was no report of cervical stenosis at time of the EMB.  She then came to [facility] with pain on 5/4 and was admitted. She was started on Ceftriaxone  and Flagyl  due to mild diverticulitis on CT imaging through [facility] and initially improved but then started to worsen again during this hospital admission despite antibiotics. She was seen by general surgery team who also recommended second opinion by GYN.   On 01/06/2024 patient underwent a hysteroscopy, D&C, Pap smear, diagnostic laparoscopy.  During hysteroscopy, D&C, there was note of uterine perforation.  Laparoscopy was performed with small defect noted in anterior left side of the uterus with very little bleeding and no evidence of bowel injury or trauma.  Pap smear  returned nondiagnostic.  The curetting showed rare atypical squamous epithelium  consistent with L SIL and no definite endometrial tissue on endometrial curettings.  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  Patient was subsequently readmitted to the hospital 01/11/2024 for concern of pancreatitis and cholecystitis.  She went to the operating room on 01/17/2024 for laparoscopic cholecystectomy.  Given patient was going to the OR, I was asked to repeat the D&C at time of laparoscopy to see if able to obtain sampling of the lining given inadequate sampling previously.  Predominantly blood was removed during curettage.  Final pathology was again inadequate with scant fragments of benign cervical mucosa and blood and fibrin.  Interval History: [facility] today with her son.  Reports that she has had decreased appetite and no energy since discharge from the hospital.  Tolerating soft and liquid things like chicken broth, sherbet.  Has occasional nausea but no vomiting.  Having a bowel movement about every 2 days or so and passing some gas.  Has follow-up with [facility] surgery on 02/08/2024.  No longer needing pain meds.  Reports that she has had some pink discharge/drainage while in the hospital and at home but this is getting less over time.  Currently staying with her daughter but hoping to get back to her home at some point.  Previously was living independently.  Also reports that she believes she had crier procedure on her cervix over 20 years ago.  Does not believe that she had anything abnormal since then but also not sure if she had Pap smear since then.    Past Medical/Surgical History: Past Medical History:  Diagnosis Date   Arthritis    Diabetes mellitus    Hypercholesteremia    Hypertension     Past Surgical History:  Procedure Laterality Date   BREAST CYST EXCISION     CHOLECYSTECTOMY N/A 01/17/2024   Procedure: LAPAROSCOPIC CHOLECYSTECTOMY WITH INTRAOPERATIVE CHOLANGIOGRAM;  Surgeon: Candyce Champagne, MD;  Location: [facility];  Service: General;  Laterality: N/A;    DILATION AND CURETTAGE OF UTERUS N/A 01/17/2024   Procedure: DILATION AND CURETTAGE;  Surgeon: Derrel Flies, MD;  Location: [facility];  Service: Gynecology;  Laterality: N/A;   HEMORRHOID SURGERY     HIP SURGERY     HYSTEROSCOPY WITH D & C N/A 01/06/2024   Procedure: DILATATION AND CURETTAGE /HYSTEROSCOPY;  Surgeon: [name], MD;  Location: [facility];  Service: Gynecology;  Laterality: N/A;  PAP SMEAR (DR TO BRING SUPPLIES)  TUCKERS CARD   JOINT REPLACEMENT     hip replacement   LAPAROSCOPIC LYSIS OF ADHESIONS  01/06/2024   Procedure: LYSIS, ADHESIONS, LAPAROSCOPIC;  Surgeon: [name], MD;  Location: [facility];  Service: Gynecology;;   LAPAROSCOPY N/A 01/06/2024   Procedure: LAPAROSCOPY, DIAGNOSTIC;  Surgeon: [name], MD;  Location: [facility];  Service: Gynecology;  Laterality: N/A;   THYROID SURGERY     TONSILLECTOMY     TUBAL LIGATION      Family History  Problem Relation Age of Onset   Breast cancer Neg Hx    Prostate cancer Neg Hx    Pancreatic cancer Neg Hx    Colon cancer Neg Hx    Endometrial cancer Neg Hx    Ovarian cancer Neg Hx     Social History   Socioeconomic History   Marital status: Single    Spouse name: Not on file   Number of children: Not on file   Years of education: Not on file   Highest  education level: Not on file  Occupational History   Not on file  Tobacco Use   Smoking status: Every Day    Current packs/day: 0.50    Types: Cigarettes    Passive exposure: Never   Smokeless tobacco: Never  Substance and Sexual Activity   Alcohol use: No   Drug use: No   Sexual activity: Not on file  Other Topics Concern   Not on file  Social History Narrative   Not on file   Social Drivers of Health   Financial Resource Strain: Not on file  Food Insecurity: No Food Insecurity (01/11/2024)   Hunger Vital Sign    Worried About Running Out of Food in the Last Year: Never true    Ran Out of Food in the Last Year: Never true  Transportation  Needs: No Transportation Needs (01/11/2024)   PRAPARE - Administrator, Civil Service (Medical): No    Lack of Transportation (Non-Medical): No  Physical Activity: Not on file  Stress: Not on file  Social Connections: Unknown (01/11/2024)   Social Connection and Isolation Panel [NHANES]    Frequency of Communication with Friends and Family: Twice a week    Frequency of Social Gatherings with Friends and Family: Twice a week    Attends Religious Services: 1 to 4 times per year    Active Member of [employer] or Organizations: No    Attends Engineer, structural: 1 to 4 times per year    Marital Status: Patient declined    Current Medications:  Current Outpatient Medications:    acetaminophen  (TYLENOL ) 500 MG tablet, Take 1,000 mg by mouth every 6 (six) hours as needed for moderate pain., Disp: , Rfl:    ammonium lactate (AMLACTIN) 12 % cream, Apply 1 Application topically as needed for dry skin., Disp: , Rfl:    bisacodyl  5 MG EC tablet, Take 5 mg by mouth daily as needed for moderate constipation., Disp: , Rfl:    buPROPion  (WELLBUTRIN  XL) 150 MG 24 hr tablet, Take 150 mg by mouth every evening., Disp: , Rfl:    Cholecalciferol  (VITAMIN D3) 25 MCG (1000 UT) CAPS, Take 1,000 Units by mouth daily., Disp: , Rfl:    diphenhydramine -acetaminophen  (TYLENOL  PM) 25-500 MG TABS tablet, Take 2 tablets by mouth at bedtime as needed (Sleep/Pain)., Disp: , Rfl:    ezetimibe  (ZETIA ) 10 MG tablet, Take 1 tablet by mouth daily., Disp: , Rfl:    Ferrous Sulfate (IRON PO), Take 1 tablet by mouth daily., Disp: , Rfl:    fluticasone  (FLONASE ) 50 MCG/ACT nasal spray, Place 2 sprays into both nostrils daily. (Patient taking differently: Place 2 sprays into both nostrils daily as needed for rhinitis.), Disp: 16 g, Rfl: 2   gabapentin  (NEURONTIN ) 300 MG capsule, Take 600 mg by mouth 2 (two) times daily., Disp: , Rfl:    glipiZIDE (GLUCOTROL) 5 MG tablet, Take 5 mg by mouth 2 (two) times daily before  a meal., Disp: , Rfl:    hydrochlorothiazide  (HYDRODIURIL ) 25 MG tablet, Take 1 tablet (25 mg total) by mouth daily., Disp: 30 tablet, Rfl: 0   levothyroxine  (SYNTHROID ) 75 MCG tablet, Take 1 tablet by mouth daily., Disp: , Rfl:    losartan  (COZAAR ) 50 MG tablet, Take 50 mg by mouth daily., Disp: , Rfl:    magnesium  oxide (MAG-OX) 400 MG tablet, Take 400 mg by mouth daily., Disp: , Rfl:    meclizine (ANTIVERT) 25 MG tablet, Take 25 mg by mouth daily.,  Disp: , Rfl:    methocarbamol  (ROBAXIN ) 500 MG tablet, Take 1 tablet (500 mg total) by mouth 2 (two) times daily as needed for muscle spasms., Disp: 14 tablet, Rfl: 0   mometasone (ELOCON) 0.1 % cream, Apply 1 Application topically daily as needed (Dermatitis)., Disp: , Rfl:    omeprazole (PRILOSEC) 40 MG capsule, Take 40 mg by mouth daily., Disp: , Rfl:    ondansetron  (ZOFRAN ) 4 MG tablet, Take 1 tablet (4 mg total) by mouth every 6 (six) hours as needed for nausea., Disp: 20 tablet, Rfl: 0   oxyCODONE  (OXY IR/ROXICODONE ) 5 MG immediate release tablet, Take 1 tablet (5 mg total) by mouth every 8 (eight) hours as needed for severe pain (pain score 7-10)., Disp: 12 tablet, Rfl: 0   polycarbophil (FIBERCON) 625 MG tablet, Take 1 tablet (625 mg total) by mouth 2 (two) times daily. (Patient not taking: Reported on 01/11/2024), Disp: 60 tablet, Rfl: 0   polyethylene glycol powder (GLYCOLAX /MIRALAX ) 17 GM/SCOOP powder, Take 17 g by mouth 2 (two) times daily as needed for moderate constipation., Disp: , Rfl:    sitaGLIPtin (JANUVIA) 50 MG tablet, Take 50 mg by mouth daily., Disp: , Rfl:   Review of Symptoms: Complete 10-system review is positive for: Appetite changes, fatigue, vaginal bleeding, depression, vaginal discharge, problem with walking  Physical Exam: BP 115/63 (BP Location: Left Arm, Patient Position: Sitting)   Pulse 100   Temp 97.7 F (36.5 C) (Oral)   Resp 16   Ht 5\' 4"  (1.626 m)   Wt 147 lb 12.8 oz (67 kg)   SpO2 100%   BMI 25.37 kg/m   General: Alert, oriented, no acute distress. HEENT: Normocephalic, atraumatic. Neck symmetric without masses. Sclera anicteric. ar. Chest: Normal work of breathing. Clear to auscultation bilaterally. Cardiovascular: Regular rate and rhythm, no murmurs. Abdomen: Soft, nontender.  Normoactive bowel sounds.  No masses appreciated.  Well-healed incisions. Extremities: Grossly normal range of motion.  Warm, well perfused.  No edema bilaterally. Skin: No rashes or lesions noted.  Laboratory & Radiologic Studies: Surgical pathology (01/17/24): A. ENDOMETRIAL CURETTINGS:  - Scant fragments of benign cervical mucosa and stroma  - Blood and fibrin

## 2024-02-01 ENCOUNTER — Other Ambulatory Visit (HOSPITAL_COMMUNITY): Payer: Self-pay | Admitting: Psychiatry

## 2024-02-01 DIAGNOSIS — N950 Postmenopausal bleeding: Secondary | ICD-10-CM

## 2024-02-04 NOTE — Progress Notes (Signed)
 Patient here for follow up with Dr. Daisey Dryer and for a pre-operative appointment prior to her scheduled surgery on 02/29/2024. She is scheduled for dilation and curettage of the uterus with hysteroscopy and myosure, possible US  guidance. The surgery was discussed in detail.  See after visit summary for additional details.    Discussed post-op pain management in detail including the aspects of the enhanced recovery pathway.  We discussed the use of tylenol  post-op and to monitor for a maximum of 4,000 mg in a 24 hour period. Discussed bowel regimen as needed to prevent constipation.     Discussed measures to take at home to prevent DVT including frequent mobility.  Reportable signs and symptoms of DVT discussed. Post-operative instructions discussed and expectations for after surgery.     5 minutes spent preparing information and with the patient.  Verbalizing understanding of material discussed. No needs or concerns voiced at the end of the visit.   Advised patient to call for any needs.   This appointment is included in the global surgical bundle as pre-operative teaching and has no charge.

## 2024-02-04 NOTE — Patient Instructions (Signed)
 Preparing for your Surgery   Plan for surgery on February 29, 2024 with Dr. Derrel Flies at [facility]. You will be scheduled for dilation and curettage of the uterus with hysteroscopy and myosure, possible US  guidance.    Pre-operative Testing -You will receive a phone call from presurgical testing at [facility] to discuss surgery instructions and arrange for lab work if needed.   -Bring your insurance card, copy of an advanced directive if applicable, medication list.   -You should not be taking blood thinners or aspirin at least ten days prior to surgery unless instructed by your surgeon.   -Do not take supplements such as fish oil (omega 3), red yeast rice, turmeric before your surgery. You want to avoid medications with aspirin in them including headache powders such as BC or Goody's), Excedrin migraine.   Day Before Surgery at Home -You will be advised you can have clear liquids up until 3 hours before your surgery.     Your role in recovery Your role is to become active as soon as directed by your doctor, while still giving yourself time to heal.  Rest when you feel tired. You will be asked to do the following in order to speed your recovery:   - Cough and breathe deeply. This helps to clear and expand your lungs and can prevent pneumonia after surgery.  - STAY ACTIVE WHEN YOU GET HOME. Do mild physical activity. Walking or moving your legs help your circulation and body functions return to normal. Do not try to get up or walk alone the first time after surgery.   -If you develop swelling on one leg or the other, pain in the back of your leg, redness/warmth in one of your legs, please call the office or go to the Emergency Room to have a doppler to rule out a blood clot. For shortness of breath, chest pain-seek care in the Emergency Room as soon as possible. - Actively manage your pain. Managing your pain lets you move in comfort. We will ask you to rate your pain on a  scale of zero to 10. It is your responsibility to tell your doctor or nurse where and how much you hurt so your pain can be treated.   Special Considerations -Your final pathology results from surgery should be available around one week after surgery and the results will be relayed to you when available.   -FMLA forms can be faxed to [phone] and please allow 5-7 business days for completion.   Pain Management After Surgery -Make sure that you have Tylenol  and Ibuprofen at home IF YOU ARE ABLE TO TAKE THESE MEDICATIONS to use on a regular basis after surgery for pain control.     Bowel Regimen -It is important to prevent constipation and drink adequate amounts of liquids. You can use a laxative as needed for constipation.   Risks of Surgery Risks of surgery are low but include bleeding, infection, damage to surrounding structures, re-operation, blood clots, and very rarely death.   AFTER SURGERY INSTRUCTIONS   Return to work:  1-2 days if applicable   Activity: 1. Be up and out of the bed during the day.  Take a nap if needed.  You may walk up steps but be careful and use the hand rail.  Stair climbing will tire you more than you think, you may need to stop part way and rest.    2. No lifting or straining for 2 weeks over 10  pounds. No pushing, pulling, straining for 2 weeks.   3. No driving for minimum 24 hours after surgery.  Do not drive if you are taking narcotic pain medicine and make sure that your reaction time has returned.    4. You can shower as soon as the next day after surgery. Shower daily. No tub baths or submerging your body in water until cleared by your surgeon (2 weeks minimum). If you have the soap that was given to you by pre-surgical testing that was used before surgery, you do not need to use it afterwards because this can irritate your incisions.    5. No sexual activity and nothing in the vagina for 2 weeks.   6. You may experience vaginal spotting and  discharge after surgery.  The spotting is normal but if you experience heavy bleeding, call our office.   7. Take Tylenol  and ibuprofen for pain if you are able to take these medications for discomfort as needed.     Diet: 1. Low sodium Heart Healthy Diet is recommended but you are cleared to resume your normal (before surgery) diet after your procedure.   2. It is safe to use a laxative, such as Miralax  or Colace, if you have difficulty moving your bowels.    Wound Care: 1. Keep clean and dry.  Shower daily.   Reasons to call the Doctor: Fever - Oral temperature greater than 100.4 degrees Fahrenheit Foul-smelling vaginal discharge Difficulty urinating Nausea and vomiting Increased pain at the site of the incision that is unrelieved with pain medicine. Difficulty breathing with or without chest pain New calf pain especially if only on one side Sudden, continuing increased vaginal bleeding with or without clots.   Contacts: For questions or concerns you should contact:   Dr. Derrel Flies at [phone]   Vira Grieves, NP at [phone]   After Hours: call [phone] and have the GYN Oncologist paged/contacted (after 5 pm or on the weekends).   Messages sent via mychart are for non-urgent matters and are not responded to after hours so for urgent needs, please call the after hours number.

## 2024-02-08 ENCOUNTER — Observation Stay (HOSPITAL_COMMUNITY)
Admission: EM | Admit: 2024-02-08 | Discharge: 2024-02-15 | Disposition: A | Discharge status: Home / Self Care | Attending: Emergency Medicine | Admitting: Emergency Medicine

## 2024-02-08 ENCOUNTER — Emergency Department (HOSPITAL_COMMUNITY)

## 2024-02-08 ENCOUNTER — Other Ambulatory Visit: Payer: Self-pay

## 2024-02-08 ENCOUNTER — Encounter (HOSPITAL_COMMUNITY): Payer: Self-pay

## 2024-02-08 DIAGNOSIS — K859 Acute pancreatitis without necrosis or infection, unspecified: Secondary | ICD-10-CM | POA: Diagnosis present

## 2024-02-08 DIAGNOSIS — R627 Adult failure to thrive: Secondary | ICD-10-CM | POA: Insufficient documentation

## 2024-02-08 DIAGNOSIS — E039 Hypothyroidism, unspecified: Secondary | ICD-10-CM | POA: Diagnosis not present

## 2024-02-08 DIAGNOSIS — R9389 Abnormal findings on diagnostic imaging of other specified body structures: Secondary | ICD-10-CM | POA: Diagnosis present

## 2024-02-08 DIAGNOSIS — E782 Mixed hyperlipidemia: Secondary | ICD-10-CM | POA: Diagnosis present

## 2024-02-08 DIAGNOSIS — K5792 Diverticulitis of intestine, part unspecified, without perforation or abscess without bleeding: Secondary | ICD-10-CM | POA: Diagnosis present

## 2024-02-08 DIAGNOSIS — I10 Essential (primary) hypertension: Secondary | ICD-10-CM | POA: Diagnosis present

## 2024-02-08 DIAGNOSIS — E890 Postprocedural hypothyroidism: Secondary | ICD-10-CM | POA: Diagnosis present

## 2024-02-08 DIAGNOSIS — N8500 Endometrial hyperplasia, unspecified: Secondary | ICD-10-CM | POA: Diagnosis not present

## 2024-02-08 DIAGNOSIS — R109 Unspecified abdominal pain: Secondary | ICD-10-CM | POA: Diagnosis present

## 2024-02-08 DIAGNOSIS — F17210 Nicotine dependence, cigarettes, uncomplicated: Secondary | ICD-10-CM | POA: Diagnosis not present

## 2024-02-08 DIAGNOSIS — Z79899 Other long term (current) drug therapy: Secondary | ICD-10-CM | POA: Insufficient documentation

## 2024-02-08 DIAGNOSIS — E860 Dehydration: Secondary | ICD-10-CM

## 2024-02-08 DIAGNOSIS — I129 Hypertensive chronic kidney disease with stage 1 through stage 4 chronic kidney disease, or unspecified chronic kidney disease: Secondary | ICD-10-CM | POA: Diagnosis not present

## 2024-02-08 DIAGNOSIS — K219 Gastro-esophageal reflux disease without esophagitis: Secondary | ICD-10-CM | POA: Diagnosis present

## 2024-02-08 DIAGNOSIS — K5780 Diverticulitis of intestine, part unspecified, with perforation and abscess without bleeding: Principal | ICD-10-CM

## 2024-02-08 DIAGNOSIS — I739 Peripheral vascular disease, unspecified: Secondary | ICD-10-CM | POA: Diagnosis present

## 2024-02-08 DIAGNOSIS — N1831 Chronic kidney disease, stage 3a: Secondary | ICD-10-CM | POA: Diagnosis not present

## 2024-02-08 DIAGNOSIS — N179 Acute kidney failure, unspecified: Secondary | ICD-10-CM | POA: Diagnosis present

## 2024-02-08 DIAGNOSIS — K5720 Diverticulitis of large intestine with perforation and abscess without bleeding: Principal | ICD-10-CM | POA: Insufficient documentation

## 2024-02-08 DIAGNOSIS — E1142 Type 2 diabetes mellitus with diabetic polyneuropathy: Secondary | ICD-10-CM | POA: Diagnosis present

## 2024-02-08 DIAGNOSIS — E1122 Type 2 diabetes mellitus with diabetic chronic kidney disease: Secondary | ICD-10-CM | POA: Insufficient documentation

## 2024-02-08 DIAGNOSIS — E119 Type 2 diabetes mellitus without complications: Secondary | ICD-10-CM

## 2024-02-08 LAB — CBC WITH DIFFERENTIAL/PLATELET
Abs Immature Granulocytes: 0.01 10*3/uL (ref 0.00–0.07)
Basophils Absolute: 0 10*3/uL (ref 0.0–0.1)
Basophils Relative: 1 %
Eosinophils Absolute: 0.1 10*3/uL (ref 0.0–0.5)
Eosinophils Relative: 1 %
HCT: 43.2 % (ref 36.0–46.0)
Hemoglobin: 14.2 g/dL (ref 12.0–15.0)
Immature Granulocytes: 0 %
Lymphocytes Relative: 37 %
Lymphs Abs: 2.1 10*3/uL (ref 0.7–4.0)
MCH: 30 pg (ref 26.0–34.0)
MCHC: 32.9 g/dL (ref 30.0–36.0)
MCV: 91.1 fL (ref 80.0–100.0)
Monocytes Absolute: 0.6 10*3/uL (ref 0.1–1.0)
Monocytes Relative: 10 %
Neutro Abs: 2.9 10*3/uL (ref 1.7–7.7)
Neutrophils Relative %: 51 %
Platelets: 235 10*3/uL (ref 150–400)
RBC: 4.74 MIL/uL (ref 3.87–5.11)
RDW: 14.6 % (ref 11.5–15.5)
WBC: 5.6 10*3/uL (ref 4.0–10.5)
nRBC: 0 % (ref 0.0–0.2)

## 2024-02-08 LAB — GLUCOSE, CAPILLARY
Glucose-Capillary: 84 mg/dL (ref 70–99)
Glucose-Capillary: 87 mg/dL (ref 70–99)
Glucose-Capillary: 94 mg/dL (ref 70–99)

## 2024-02-08 LAB — URINALYSIS, ROUTINE W REFLEX MICROSCOPIC
Bilirubin Urine: NEGATIVE
Glucose, UA: NEGATIVE mg/dL
Ketones, ur: 5 mg/dL — AB
Nitrite: NEGATIVE
Protein, ur: NEGATIVE mg/dL
Specific Gravity, Urine: 1.043 — ABNORMAL HIGH (ref 1.005–1.030)
pH: 5 (ref 5.0–8.0)

## 2024-02-08 LAB — LIPASE, BLOOD: Lipase: 114 U/L — ABNORMAL HIGH (ref 11–51)

## 2024-02-08 LAB — COMPREHENSIVE METABOLIC PANEL WITH GFR
ALT: 19 U/L (ref 0–44)
AST: 19 U/L (ref 15–41)
Albumin: 4 g/dL (ref 3.5–5.0)
Alkaline Phosphatase: 68 U/L (ref 38–126)
Anion gap: 15 (ref 5–15)
BUN: 29 mg/dL — ABNORMAL HIGH (ref 8–23)
CO2: 22 mmol/L (ref 22–32)
Calcium: 10.7 mg/dL — ABNORMAL HIGH (ref 8.9–10.3)
Chloride: 98 mmol/L (ref 98–111)
Creatinine, Ser: 1.63 mg/dL — ABNORMAL HIGH (ref 0.44–1.00)
GFR, Estimated: 32 mL/min — ABNORMAL LOW (ref >60)
Glucose, Bld: 116 mg/dL — ABNORMAL HIGH (ref 70–99)
Potassium: 3.9 mmol/L (ref 3.5–5.1)
Sodium: 135 mmol/L (ref 135–145)
Total Bilirubin: 1.1 mg/dL (ref 0.0–1.2)
Total Protein: 8.8 g/dL — ABNORMAL HIGH (ref 6.5–8.1)

## 2024-02-08 LAB — SODIUM, URINE, RANDOM: Sodium, Ur: 32 mmol/L

## 2024-02-08 LAB — CREATININE, URINE, RANDOM: Creatinine, Urine: 219 mg/dL

## 2024-02-08 MED ORDER — ALBUTEROL SULFATE (2.5 MG/3ML) 0.083% IN NEBU: 2.5000 mg | INHALATION_SOLUTION | RESPIRATORY_TRACT | Status: DC | PRN

## 2024-02-08 MED ORDER — METHOCARBAMOL 1000 MG/10ML IJ SOLN: 500.0000 mg | Freq: Four times a day (QID) | INTRAMUSCULAR | Status: DC | PRN

## 2024-02-08 MED ORDER — BUPROPION HCL ER (XL) 150 MG PO TB24
150.0000 mg | ORAL_TABLET | Freq: Every evening | ORAL | Status: DC
  Administered 2024-02-08 – 2024-02-14 (×7): 150 mg via ORAL
  Filled 2024-02-08 (×7): qty 1

## 2024-02-08 MED ORDER — ACETAMINOPHEN 650 MG RE SUPP: 650.0000 mg | Freq: Four times a day (QID) | RECTAL | Status: DC | PRN

## 2024-02-08 MED ORDER — METHOCARBAMOL 500 MG PO TABS
500.0000 mg | ORAL_TABLET | Freq: Two times a day (BID) | ORAL | Status: DC | PRN
  Administered 2024-02-08 – 2024-02-11 (×4): 500 mg via ORAL
  Filled 2024-02-08 (×4): qty 1

## 2024-02-08 MED ORDER — MORPHINE SULFATE (PF) 4 MG/ML IV SOLN
4.0000 mg | Freq: Once | INTRAVENOUS | Status: AC
  Administered 2024-02-08: 4 mg via INTRAVENOUS
  Filled 2024-02-08: qty 1

## 2024-02-08 MED ORDER — ONDANSETRON HCL 4 MG/2ML IJ SOLN
4.0000 mg | Freq: Once | INTRAMUSCULAR | Status: AC
  Administered 2024-02-08: 4 mg via INTRAVENOUS
  Filled 2024-02-08: qty 2

## 2024-02-08 MED ORDER — SODIUM CHLORIDE 0.9% FLUSH
3.0000 mL | Freq: Two times a day (BID) | INTRAVENOUS | Status: DC
  Administered 2024-02-08 – 2024-02-15 (×13): 3 mL via INTRAVENOUS

## 2024-02-08 MED ORDER — IOHEXOL 300 MG/ML  SOLN
80.0000 mL | Freq: Once | INTRAMUSCULAR | Status: AC | PRN
  Administered 2024-02-08: 80 mL via INTRAVENOUS

## 2024-02-08 MED ORDER — POLYETHYLENE GLYCOL 3350 17 G PO PACK: 17.0000 g | PACK | Freq: Every day | ORAL | Status: DC | PRN

## 2024-02-08 MED ORDER — HYDRALAZINE HCL 20 MG/ML IJ SOLN: 5.0000 mg | Freq: Four times a day (QID) | INTRAMUSCULAR | Status: DC | PRN

## 2024-02-08 MED ORDER — METRONIDAZOLE 500 MG/100ML IV SOLN
500.0000 mg | Freq: Once | INTRAVENOUS | Status: AC
Start: 2024-02-08 — End: 2024-02-08
  Administered 2024-02-08: 500 mg via INTRAVENOUS
  Filled 2024-02-08: qty 100

## 2024-02-08 MED ORDER — ONDANSETRON HCL 4 MG PO TABS
4.0000 mg | ORAL_TABLET | Freq: Four times a day (QID) | ORAL | Status: AC | PRN
Start: 2024-02-08 — End: ?

## 2024-02-08 MED ORDER — HYDROMORPHONE HCL 1 MG/ML IJ SOLN
0.5000 mg | INTRAMUSCULAR | Status: DC | PRN
  Administered 2024-02-09: 1 mg via INTRAVENOUS
  Filled 2024-02-08: qty 1

## 2024-02-08 MED ORDER — SODIUM CHLORIDE 0.9 % IV SOLN: INTRAVENOUS | Status: DC

## 2024-02-08 MED ORDER — CEFEPIME HCL 2 G IV SOLR
2.0000 g | Freq: Once | INTRAVENOUS | Status: AC
  Administered 2024-02-08: 2 g via INTRAVENOUS
  Filled 2024-02-08: qty 12.5

## 2024-02-08 MED ORDER — LEVOTHYROXINE SODIUM 75 MCG PO TABS
75.0000 ug | ORAL_TABLET | Freq: Every day | ORAL | Status: DC
  Administered 2024-02-09 – 2024-02-15 (×7): 75 ug via ORAL
  Filled 2024-02-08 (×7): qty 1

## 2024-02-08 MED ORDER — INSULIN ASPART 100 UNIT/ML IJ SOLN
0.0000 [IU] | INTRAMUSCULAR | Status: DC
  Administered 2024-02-09: 2 [IU] via SUBCUTANEOUS
  Administered 2024-02-09: 1 [IU] via SUBCUTANEOUS
  Administered 2024-02-10: 2 [IU] via SUBCUTANEOUS
  Administered 2024-02-10: 1 [IU] via SUBCUTANEOUS
  Administered 2024-02-10 (×2): 2 [IU] via SUBCUTANEOUS
  Administered 2024-02-11: 1 [IU] via SUBCUTANEOUS
  Administered 2024-02-12 (×2): 2 [IU] via SUBCUTANEOUS
  Administered 2024-02-14: 1 [IU] via SUBCUTANEOUS
  Administered 2024-02-14: 2 [IU] via SUBCUTANEOUS

## 2024-02-08 MED ORDER — SODIUM CHLORIDE 0.9 % IV BOLUS
500.0000 mL | Freq: Once | INTRAVENOUS | Status: AC
  Administered 2024-02-08: 500 mL via INTRAVENOUS

## 2024-02-08 MED ORDER — METRONIDAZOLE 500 MG/100ML IV SOLN
500.0000 mg | Freq: Two times a day (BID) | INTRAVENOUS | Status: DC
  Administered 2024-02-09 – 2024-02-14 (×11): 500 mg via INTRAVENOUS
  Filled 2024-02-08 (×11): qty 100

## 2024-02-08 MED ORDER — BISACODYL 10 MG RE SUPP: 10.0000 mg | Freq: Every day | RECTAL | Status: DC | PRN

## 2024-02-08 MED ORDER — ONDANSETRON HCL 4 MG/2ML IJ SOLN
4.0000 mg | Freq: Four times a day (QID) | INTRAMUSCULAR | Status: DC | PRN
  Administered 2024-02-10 – 2024-02-13 (×2): 4 mg via INTRAVENOUS
  Filled 2024-02-08 (×2): qty 2

## 2024-02-08 MED ORDER — PANTOPRAZOLE SODIUM 40 MG PO TBEC
40.0000 mg | DELAYED_RELEASE_TABLET | Freq: Every day | ORAL | Status: DC
  Administered 2024-02-09 – 2024-02-15 (×7): 40 mg via ORAL
  Filled 2024-02-08 (×7): qty 1

## 2024-02-08 MED ORDER — SODIUM CHLORIDE 0.9 % IV SOLN
2.0000 g | Freq: Every day | INTRAVENOUS | Status: DC
  Administered 2024-02-09 – 2024-02-14 (×6): 2 g via INTRAVENOUS
  Filled 2024-02-08 (×6): qty 12.5

## 2024-02-08 MED ORDER — GABAPENTIN 400 MG PO CAPS
600.0000 mg | ORAL_CAPSULE | Freq: Two times a day (BID) | ORAL | Status: DC
  Administered 2024-02-08 – 2024-02-11 (×6): 600 mg via ORAL
  Filled 2024-02-08 (×6): qty 2

## 2024-02-08 MED ORDER — ACETAMINOPHEN 325 MG PO TABS
650.0000 mg | ORAL_TABLET | Freq: Four times a day (QID) | ORAL | Status: DC | PRN
  Administered 2024-02-08: 650 mg via ORAL
  Filled 2024-02-08 (×2): qty 2

## 2024-02-08 MED ORDER — NICOTINE 14 MG/24HR TD PT24
14.0000 mg | MEDICATED_PATCH | Freq: Every day | TRANSDERMAL | Status: DC
  Filled 2024-02-08 (×5): qty 1

## 2024-02-08 MED ORDER — ENOXAPARIN SODIUM 30 MG/0.3ML IJ SOSY
30.0000 mg | PREFILLED_SYRINGE | INTRAMUSCULAR | Status: DC
  Administered 2024-02-08 – 2024-02-09 (×2): 30 mg via SUBCUTANEOUS
  Filled 2024-02-08 (×2): qty 0.3

## 2024-02-08 MED ORDER — SODIUM CHLORIDE 0.9 % IV BOLUS
1000.0000 mL | Freq: Once | INTRAVENOUS | Status: AC
  Administered 2024-02-08: 1000 mL via INTRAVENOUS

## 2024-02-08 NOTE — Consult Note (Signed)
 Alexandria Durham [birth year]  [id number].    Requesting MD: Hildy Lowers Chief Complaint/Reason for Consult: Diverticulitis, recent gallstone pancreatitis s/p chole.   HPI:  78 y/o F w/ DM, HTN, and CKD who was recently admitted 5/13-5/23 for pancreatitis thought to be related to gallstones. During the hospitalization she underwent a lap chole with IOC, which did not reveal any signs of a biliary obstruction. She reports that she felt well for the first 3-4 days after discharge. She then developed general malaise and anorexia. She experienced emesis x 1 and developed epigastric and lower quadrant pain. When her symptoms persisted, she decided to present for evaluation.   CT was performed and shows an evolving peripancreatic fluid collection as well as diverticulitis of the ascending colon with microperforation. WBC 6. Cr 1.63. Tbili 1.1, LFTs WNL. Lipase 114. She is AF and HDS.   ROS: Review of Systems  Constitutional:  Positive for chills and malaise/fatigue.  HENT: Negative.    Eyes: Negative.   Respiratory: Negative.    Cardiovascular: Negative.   Gastrointestinal:  Positive for abdominal pain and nausea.  Genitourinary: Negative.   Musculoskeletal: Negative.   Skin: Negative.   Neurological: Negative.   Endo/Heme/Allergies: Negative.   Psychiatric/Behavioral: Negative.      Family History  Problem Relation Age of Onset   Breast cancer Neg Hx    Prostate cancer Neg Hx    Pancreatic cancer Neg Hx    Colon cancer Neg Hx    Endometrial cancer Neg Hx    Ovarian cancer Neg Hx     Past Medical History:  Diagnosis Date   Arthritis    Diabetes mellitus    Hypercholesteremia    Hypertension     Past Surgical History:  Procedure Laterality Date   BREAST CYST EXCISION     CHOLECYSTECTOMY N/A 01/17/2024   Procedure: LAPAROSCOPIC CHOLECYSTECTOMY WITH INTRAOPERATIVE CHOLANGIOGRAM;  Surgeon: Candyce Champagne, MD;  Location: [facility];  Service: General;  Laterality: N/A;   DILATION  AND CURETTAGE OF UTERUS N/A 01/17/2024   Procedure: DILATION AND CURETTAGE;  Surgeon: Derrel Flies, MD;  Location: [facility];  Service: Gynecology;  Laterality: N/A;   HEMORRHOID SURGERY     HIP SURGERY     HYSTEROSCOPY WITH D & C N/A 01/06/2024   Procedure: DILATATION AND CURETTAGE /HYSTEROSCOPY;  Surgeon: [name], MD;  Location: [facility];  Service: Gynecology;  Laterality: N/A;  PAP SMEAR (DR TO BRING SUPPLIES)  TUCKERS CARD   JOINT REPLACEMENT     hip replacement   LAPAROSCOPIC LYSIS OF ADHESIONS  01/06/2024   Procedure: LYSIS, ADHESIONS, LAPAROSCOPIC;  Surgeon: [name], MD;  Location: [facility];  Service: Gynecology;;   LAPAROSCOPY N/A 01/06/2024   Procedure: LAPAROSCOPY, DIAGNOSTIC;  Surgeon: [name], MD;  Location: [facility];  Service: Gynecology;  Laterality: N/A;   THYROID SURGERY     TONSILLECTOMY     TUBAL LIGATION      Social History:  reports that she has been smoking cigarettes. She has never been exposed to tobacco smoke. She has never used smokeless tobacco. She reports that she does not drink alcohol and does not use drugs.  Allergies:  Allergies  Allergen Reactions   Ace Inhibitors Hives and Itching   Aspirin Palpitations   Codeine Palpitations   Penicillins Rash    Medications Prior to Admission  Medication Sig Dispense Refill   acetaminophen  (TYLENOL ) 500 MG tablet Take 1,000 mg by mouth every 6 (six) hours as needed for moderate pain.  ammonium lactate (AMLACTIN) 12 % cream Apply 1 Application topically as needed for dry skin.     bisacodyl  5 MG EC tablet Take 5 mg by mouth daily as needed for moderate constipation.     buPROPion  (WELLBUTRIN  XL) 150 MG 24 hr tablet Take 150 mg by mouth every evening.     Cholecalciferol  (VITAMIN D3) 25 MCG (1000 UT) CAPS Take 1,000 Units by mouth daily.     diphenhydramine -acetaminophen  (TYLENOL  PM) 25-500 MG TABS tablet Take 2 tablets by mouth at bedtime as needed (Sleep/Pain).     ezetimibe  (ZETIA ) 10 MG tablet  Take 1 tablet by mouth daily.     Ferrous Sulfate (IRON PO) Take 1 tablet by mouth daily.     fluticasone  (FLONASE ) 50 MCG/ACT nasal spray Place 2 sprays into both nostrils daily. (Patient taking differently: Place 2 sprays into both nostrils daily as needed for rhinitis.) 16 g 2   gabapentin  (NEURONTIN ) 300 MG capsule Take 600 mg by mouth 2 (two) times daily.     glipiZIDE (GLUCOTROL) 5 MG tablet Take 5 mg by mouth 2 (two) times daily before a meal.     hydrochlorothiazide  (HYDRODIURIL ) 25 MG tablet Take 1 tablet (25 mg total) by mouth daily. 30 tablet 0   levothyroxine  (SYNTHROID ) 75 MCG tablet Take 1 tablet by mouth daily.     losartan  (COZAAR ) 50 MG tablet Take 50 mg by mouth daily.     magnesium  oxide (MAG-OX) 400 MG tablet Take 400 mg by mouth daily.     meclizine (ANTIVERT) 25 MG tablet Take 25 mg by mouth daily.     methocarbamol  (ROBAXIN ) 500 MG tablet Take 1 tablet (500 mg total) by mouth 2 (two) times daily as needed for muscle spasms. 14 tablet 0   mometasone (ELOCON) 0.1 % cream Apply 1 Application topically daily as needed (Dermatitis).     omeprazole (PRILOSEC) 40 MG capsule Take 40 mg by mouth daily.     ondansetron  (ZOFRAN ) 4 MG tablet Take 1 tablet (4 mg total) by mouth every 6 (six) hours as needed for nausea. 20 tablet 0   oxyCODONE  (OXY IR/ROXICODONE ) 5 MG immediate release tablet Take 1 tablet (5 mg total) by mouth every 8 (eight) hours as needed for severe pain (pain score 7-10). 12 tablet 0   polycarbophil (FIBERCON) 625 MG tablet Take 1 tablet (625 mg total) by mouth 2 (two) times daily. 60 tablet 0   polyethylene glycol powder (GLYCOLAX /MIRALAX ) 17 GM/SCOOP powder Take 17 g by mouth 2 (two) times daily as needed for moderate constipation.     sitaGLIPtin (JANUVIA) 50 MG tablet Take 50 mg by mouth daily.      Physical Exam: Blood pressure 131/69, pulse 85, temperature (!) 97.5 F (36.4 C), temperature source Oral, resp. rate 16, height 5\' 4"  (1.626 m), weight 67 kg,  SpO2 100%. Gen: elderly female, NAD Abd: soft, non-distended, port sites well healed, TTP in the epigastric region and with deep palpation of the lower abdomen, no rebound/guarding, no peritoneal signs  Results for orders placed or performed during the hospital encounter of 02/08/24 (from the past 48 hours)  Urinalysis, Routine w reflex microscopic -Urine, Clean Catch     Status: Abnormal   Collection Time: 02/08/24 12:41 PM  Result Value Ref Range   Color, Urine YELLOW YELLOW   APPearance HAZY (A) CLEAR   Specific Gravity, Urine 1.043 (H) 1.005 - 1.030   pH 5.0 5.0 - 8.0   Glucose, UA NEGATIVE NEGATIVE mg/dL  Hgb urine dipstick SMALL (A) NEGATIVE   Bilirubin Urine NEGATIVE NEGATIVE   Ketones, ur 5 (A) NEGATIVE mg/dL   Protein, ur NEGATIVE NEGATIVE mg/dL   Nitrite NEGATIVE NEGATIVE   Leukocytes,Ua LARGE (A) NEGATIVE   RBC / HPF 6-10 0 - 5 RBC/hpf   WBC, UA 21-50 0 - 5 WBC/hpf   Bacteria, UA MANY (A) NONE SEEN   Squamous Epithelial / HPF 11-20 0 - 5 /HPF   Hyaline Casts, UA PRESENT     Comment: Performed at [facility], [address] [place], [place] [address]  Comprehensive metabolic panel     Status: Abnormal   Collection Time: 02/08/24  1:05 PM  Result Value Ref Range   Sodium 135 135 - 145 mmol/L   Potassium 3.9 3.5 - 5.1 mmol/L   Chloride 98 98 - 111 mmol/L   CO2 22 22 - 32 mmol/L   Glucose, Bld 116 (H) 70 - 99 mg/dL    Comment: Glucose reference range applies only to samples taken after fasting for at least 8 hours.   BUN 29 (H) 8 - 23 mg/dL   Creatinine, Ser 1.30 (H) 0.44 - 1.00 mg/dL   Calcium  10.7 (H) 8.9 - 10.3 mg/dL   Total Protein 8.8 (H) 6.5 - 8.1 g/dL   Albumin 4.0 3.5 - 5.0 g/dL   AST 19 15 - 41 U/L   ALT 19 0 - 44 U/L   Alkaline Phosphatase 68 38 - 126 U/L   Total Bilirubin 1.1 0.0 - 1.2 mg/dL   GFR, Estimated 32 (L) >60 mL/min    Comment: (NOTE) Calculated using the CKD-EPI Creatinine Equation (2021)    Anion gap 15 5 - 15     Comment: Performed at [facility], [address] [place], [place] [address]  Lipase, blood     Status: Abnormal   Collection Time: 02/08/24  1:05 PM  Result Value Ref Range   Lipase 114 (H) 11 - 51 U/L    Comment: Performed at [facility], [address] [place], [place] [address]  CBC with Diff     Status: None   Collection Time: 02/08/24  1:05 PM  Result Value Ref Range   WBC 5.6 4.0 - 10.5 K/uL   RBC 4.74 3.87 - 5.11 MIL/uL   Hemoglobin 14.2 12.0 - 15.0 g/dL   HCT 95.2 84.1 - 32.4 %   MCV 91.1 80.0 - 100.0 fL   MCH 30.0 26.0 - 34.0 pg   MCHC 32.9 30.0 - 36.0 g/dL   RDW 40.1 02.7 - 25.3 %   Platelets 235 150 - 400 K/uL   nRBC 0.0 0.0 - 0.2 %   Neutrophils Relative % 51 %   Neutro Abs 2.9 1.7 - 7.7 K/uL   Lymphocytes Relative 37 %   Lymphs Abs 2.1 0.7 - 4.0 K/uL   Monocytes Relative 10 %   Monocytes Absolute 0.6 0.1 - 1.0 K/uL   Eosinophils Relative 1 %   Eosinophils Absolute 0.1 0.0 - 0.5 K/uL   Basophils Relative 1 %   Basophils Absolute 0.0 0.0 - 0.1 K/uL   Immature Granulocytes 0 %   Abs Immature Granulocytes 0.01 0.00 - 0.07 K/uL    Comment: Performed at [facility], [address] [place], [place] [address]  Glucose, capillary     Status: None   Collection Time: 02/08/24  6:06 PM  Result Value Ref Range   Glucose-Capillary 94 70 - 99  mg/dL    Comment: Glucose reference range applies only to samples taken after fasting for at least 8 hours.  Glucose, capillary     Status: None   Collection Time: 02/08/24  7:19 PM  Result Value Ref Range   Glucose-Capillary 87 70 - 99 mg/dL    Comment: Glucose reference range applies only to samples taken after fasting for at least 8 hours.   CT ABDOMEN PELVIS W CONTRAST Result Date: 02/08/2024 CLINICAL DATA:  Epigastric pain. Laparoscopic cholecystectomy proximally 1 month ago. EXAM: CT ABDOMEN AND PELVIS WITH CONTRAST TECHNIQUE: Multidetector CT imaging of the abdomen  and pelvis was performed using the standard protocol following bolus administration of intravenous contrast. RADIATION DOSE REDUCTION: This exam was performed according to the departmental dose-optimization program which includes automated exposure control, adjustment of the mA and/or kV according to patient size and/or use of iterative reconstruction technique. CONTRAST:  80mL OMNIPAQUE  IOHEXOL  300 MG/ML  SOLN COMPARISON:  01/15/2024 FINDINGS: Lower chest: Descending thoracic aortic atherosclerosis. Cylindrical bronchiectasis in both lower lobes with mild medial scarring or subsegmental atelectasis in both lower lobes. Hepatobiliary: Cholecystectomy. Trace residual fluid along the gallbladder fossa is considered within normal limits for postoperative status. No current biliary dilatation. No significant hepatic parenchymal lesion identified. Pancreas: Abnormal hypoenhancing tissue between the pancreatic head and the proximal transverse duodenum measures 3.1 by 1.8 by 1.9 cm as shown on image 31 series 2. This appears to represent a reduction in size and coalescence of prior inflammation in this region shown on 01/15/2024. I am skeptical that this is a pancreatic malignancy given the evolution in findings. No pseudocyst identified. Spleen: Unremarkable Adrenals/Urinary Tract: Unremarkable. Portions the right distal ureter are somewhat obscured by streak artifact from the right hip implant despite use of metal artifact reduction sequences. Stomach/Bowel: Widespread colonic diverticulosis extending from the cecum through the sigmoid colon, with active diverticulitis in the ascending colon anteriorly shown on image 40 series 11. In addition to inflammatory stranding there are a couple of tiny locules of gas with uncertain relationship with regard to the diverticular wall, this could reflect local micro perforation no free traveling intraperitoneal gas is readily apparent. Normal appendix. Vascular/Lymphatic:  Atherosclerosis is present, including aortoiliac atherosclerotic disease. Atheromatous narrowing of the celiac trunk proximally due to dorsal plaque. Reproductive: Continued distended endometrial cavity up to 2.9 cm in stripe thickness. Although possibly from cervical stenosis, endometrial sampling is indicated to exclude endometrial carcinoma. Other: Postoperative findings related to prior port placement in the subxiphoid region. Musculoskeletal: Right total hip prosthesis. 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 Grade 1 anterolisthesis at L4-5 appears degenerative in nature. Lower lumbar spondylosis and degenerative disc disease. IMPRESSION: 1. Acute diverticulitis of the ascending colon with a couple of tiny locules of gas with uncertain relationship with regard to the diverticular wall, this could reflect local microperforation. No free traveling intraperitoneal gas is readily apparent. No drainable abscess. 2. Abnormal hypoenhancing tissue between the pancreatic head and the proximal transverse duodenum measures 3.1 by 1.8 by 1.9 cm. This appears to represent a reduction in size and coalescence of prior inflammation in this region shown on 01/15/2024. 3. Continued distended endometrial cavity up to 2.9 cm in stripe thickness. Although possibly from cervical stenosis, clinical assessment of the cervix and endometrial sampling is indicated to exclude endometrial carcinoma. 4. 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 5. Cylindrical bronchiectasis in both lower lobes with mild medial scarring or subsegmental atelectasis in both lower lobes. 6.  Aortic Atherosclerosis (ICD10-I70.0). Electronically Signed   By: Freida Jes M.D.   On: 02/08/2024 15:43    Assessment/Plan 78 y/o F w/ recent surgery for perforated uterus followed by an admission for pancreatitis s/p lap chole with negative IOC who presents with abdominal pain and malaise and has evidence  of diverticulitis w/ microperforation - No indication for urgent surgical intervention. She does not have signs of uncontrolled infection or peritonitis related to her diverticulitis - Low suspicion for a retained stone given negative IOC at the time of cholecystectomy. Her imaging appears to show expected evolution of her pancreatitis - Agree with IV abx - Okay for a CLD - Monitor abdominal exam - Surgery will follow  Trula Gable Surgery 02/08/2024, 7:27 PM Please see Amion for pager number during day hours 7:00am-4:30pm or 7:00am -11:30am on weekends

## 2024-02-08 NOTE — ED Triage Notes (Signed)
 Pt went to a follow up post cholecystectomy appt. Pt is not eating and is continuing to have pain. Provider would like a Ct and labs on pt.

## 2024-02-08 NOTE — H&P (Signed)
 History and Physical    [name]:[id number] DOB: 11-Jun-1946 DOA: 02/08/2024  PCP: Bayard Limbo, NP  Patient coming from: Home  I have personally briefly reviewed patient's old medical records in [facility]  Chief Complaint: Worsening upper abdominal pain  HPI: Alexandria Durham is a 78 y.o. female with medical history significant of hypertension, diabetes mellitus type 2, hyperlipidemia, hypothyroidism, GERD, CKD stage IIIa, diverticulosis, tobacco use recently hospitalized 01/11/2024-01/21/2024 for acute biliary pancreatitis with acute cholecystitis status post laparoscopic cholecystectomy, status post D&C on 01/17/2024 for thickened endometrium during laparoscopic cholecystectomy presenting to the ED with abdominal pain which started 3 to 5 days postdischarge.  Patient with complaints of significant epigastric abdominal pain, nonradiating, as well as some left lower quadrant pain.  Patient however states epigastric pain worse than the left lower quadrant abdominal pain.  Patient denies any fevers, no diarrhea, no dysuria, no melena, no hematemesis, no hematochezia, no chest pain, no shortness of breath.  Patient does endorse some nausea, chills, constipation, lightheadedness and dizziness, generalized weakness, significantly decreased oral intake. Patient noted to have presented to general surgical office for hospital follow-up however due to complaints of abdominal pain was directed to the ED for further evaluation.  Patient noted to have had a bout of emesis in the ED.  ED Course: Patient seen in the ED vital signs stable.  Urinalysis pending.  Comprehensive metabolic profile with a glucose of 116, BUN of 29, creatinine of 1.63, calcium  of 10.7, protein of 8.8 otherwise within normal limits.  Lipase level noted elevated at 114.  CBC noted to be unremarkable.  CT abdomen and pelvis done concerning for acute diverticulitis of the ascending colon with a couple of tiny locules of gas with  uncertain relationship with regard to diverticular wall could reflect local microperforation.  No drainable abscess noted.  Abnormal hypoenhancing tissue between the pancreatic head and proximal transverse duodenum measures 3.1 x 1.8 x 1.9 cm appears to represent a reduction in size and correlates of prior inflammation of this region shown on 01/15/2024.  Continue distended endometrial cavity up to 2.9 cm in stripe thickness.  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  Cylindrical bronchiectasis in both lower lobes with mild medial scarring or subsegmental atelectasis in both lower lobes.  Aortic atherosclerosis.  Patient given IV fluids.  Patient also given a dose of IV cefepime  and IV Flagyl .  Hospitalist were called to admit patient for further evaluation and management.  Review of Systems: As per HPI otherwise all other systems reviewed and are negative. Unacceptable ROS statements: "10 systems reviewed," "Extensive" (without elaboration).  Acceptable ROS statements: "All others negative," "All others reviewed and are negative," and "All others unremarkable," with at LEAST ONE ROS documented Can't double dip - if using for HPI can't use for ROS  Past Medical History:  Diagnosis Date   Arthritis    Diabetes mellitus    Hypercholesteremia    Hypertension     Past Surgical History:  Procedure Laterality Date   BREAST CYST EXCISION     CHOLECYSTECTOMY N/A 01/17/2024   Procedure: LAPAROSCOPIC CHOLECYSTECTOMY WITH INTRAOPERATIVE CHOLANGIOGRAM;  Surgeon: Candyce Champagne, MD;  Location: [facility];  Service: General;  Laterality: N/A;   DILATION AND CURETTAGE OF UTERUS N/A 01/17/2024   Procedure: DILATION AND CURETTAGE;  Surgeon: Derrel Flies, MD;  Location: [facility];  Service: Gynecology;  Laterality: N/A;   HEMORRHOID SURGERY     HIP SURGERY     HYSTEROSCOPY WITH  D & C N/A 01/06/2024   Procedure: DILATATION AND CURETTAGE /HYSTEROSCOPY;  Surgeon: [name], MD;   Location: [facility];  Service: Gynecology;  Laterality: N/A;  PAP SMEAR (DR TO BRING SUPPLIES)  TUCKERS CARD   JOINT REPLACEMENT     hip replacement   LAPAROSCOPIC LYSIS OF ADHESIONS  01/06/2024   Procedure: LYSIS, ADHESIONS, LAPAROSCOPIC;  Surgeon: [name], MD;  Location: [facility];  Service: Gynecology;;   LAPAROSCOPY N/A 01/06/2024   Procedure: LAPAROSCOPY, DIAGNOSTIC;  Surgeon: [name], MD;  Location: [facility];  Service: Gynecology;  Laterality: N/A;   THYROID SURGERY     TONSILLECTOMY     TUBAL LIGATION      Social History  reports that she has been smoking cigarettes. She has never been exposed to tobacco smoke. She has never used smokeless tobacco. She reports that she does not drink alcohol and does not use drugs.  Allergies  Allergen Reactions   Ace Inhibitors Hives and Itching   Aspirin Palpitations   Codeine Palpitations   Penicillins Rash    Family History  Problem Relation Age of Onset   Breast cancer Neg Hx    Prostate cancer Neg Hx    Pancreatic cancer Neg Hx    Colon cancer Neg Hx    Endometrial cancer Neg Hx    Ovarian cancer Neg Hx    Mother deceased in her 37s from an acute MI also had a history of aneurysm.  Father deceased in his 70s from a perforated ulcer per patient.  Prior to Admission medications   Medication Sig Start Date End Date Taking? Authorizing Provider  acetaminophen  (TYLENOL ) 500 MG tablet Take 1,000 mg by mouth every 6 (six) hours as needed for moderate pain.    [provider]  ammonium lactate (AMLACTIN) 12 % cream Apply 1 Application topically as needed for dry skin. 11/21/15   [provider]  bisacodyl  5 MG EC tablet Take 5 mg by mouth daily as needed for moderate constipation.    [provider]  buPROPion  (WELLBUTRIN  XL) 150 MG 24 hr tablet Take 150 mg by mouth every evening.    [provider]  Cholecalciferol  (VITAMIN D3) 25 MCG (1000 UT) CAPS Take 1,000 Units by mouth daily.    [provider]  diphenhydramine -acetaminophen  (TYLENOL  PM) 25-500 MG TABS tablet Take 2 tablets by mouth at bedtime as needed (Sleep/Pain).    [provider]  ezetimibe  (ZETIA ) 10 MG tablet Take 1 tablet by mouth daily. 10/29/23 10/28/24  [provider]  Ferrous Sulfate (IRON PO) Take 1 tablet by mouth daily.    [provider]  fluticasone  (FLONASE ) 50 MCG/ACT nasal spray Place 2 sprays into both nostrils daily. Patient taking differently: Place 2 sprays into both nostrils daily as needed for rhinitis. 10/16/16   Joy, Shawn C, PA-C  gabapentin  (NEURONTIN ) 300 MG capsule Take 600 mg by mouth 2 (two) times daily. 08/03/20   [provider]  glipiZIDE (GLUCOTROL) 5 MG tablet Take 5 mg by mouth 2 (two) times daily before a meal.    [provider]  hydrochlorothiazide  (HYDRODIURIL ) 25 MG tablet Take 1 tablet (25 mg total) by mouth daily. 01/10/24   Lorita Rosa, MD  levothyroxine  (SYNTHROID ) 75 MCG tablet Take 1 tablet by mouth daily. 09/22/22   [provider]  losartan  (COZAAR ) 50 MG tablet Take 50 mg by mouth daily.    [provider]  magnesium  oxide (MAG-OX) 400 MG tablet Take 400 mg by  mouth daily. 03/01/17   [provider]  meclizine (ANTIVERT) 25 MG tablet Take 25 mg by mouth daily.    [provider]  methocarbamol  (ROBAXIN ) 500 MG tablet Take 1 tablet (500 mg total) by mouth 2 (two) times daily as needed for muscle spasms. 05/25/21   Joldersma, Logan, PA-C  mometasone (ELOCON) 0.1 % cream Apply 1 Application topically daily as needed (Dermatitis). 10/27/23   [provider]  omeprazole (PRILOSEC) 40 MG capsule Take 40 mg by mouth daily.    [provider]  ondansetron  (ZOFRAN ) 4 MG tablet Take 1 tablet (4 mg total) by mouth every 6 (six) hours as needed for nausea. 01/21/24   Danford, Willis Harter, MD  oxyCODONE  (OXY IR/ROXICODONE ) 5 MG immediate release tablet Take 1 tablet (5 mg total) by mouth  every 8 (eight) hours as needed for severe pain (pain score 7-10). 01/21/24   Danford, Willis Harter, MD  polycarbophil (FIBERCON) 625 MG tablet Take 1 tablet (625 mg total) by mouth 2 (two) times daily. Patient not taking: Reported on 01/11/2024 01/09/24   Lorita Rosa, MD  polyethylene glycol powder (GLYCOLAX /MIRALAX ) 17 GM/SCOOP powder Take 17 g by mouth 2 (two) times daily as needed for moderate constipation. 08/15/15   [provider]  sitaGLIPtin (JANUVIA) 50 MG tablet Take 50 mg by mouth daily. 10/28/22   [provider]    Physical Exam: Vitals:   02/08/24 1226 02/08/24 1242 02/08/24 1636  BP: 119/77  136/86  Pulse: 100  82  Resp: 16  18  Temp: 97.6 F (36.4 C)  (!) 97.4 F (36.3 C)  TempSrc: Oral  Oral  SpO2: 98%  100%  Weight:  67 kg   Height:  5\' 4"  (1.626 m)     Constitutional: NAD, calm, comfortable Vitals:   02/08/24 1226 02/08/24 1242 02/08/24 1636  BP: 119/77  136/86  Pulse: 100  82  Resp: 16  18  Temp: 97.6 F (36.4 C)  (!) 97.4 F (36.3 C)  TempSrc: Oral  Oral  SpO2: 98%  100%  Weight:  67 kg   Height:  5\' 4"  (1.626 m)    Eyes: PERRL, lids and conjunctivae normal ENMT: Mucous membranes are extremely dry. Posterior pharynx clear of any exudate or lesions.Normal dentition.  Neck: normal, supple, no masses, no thyromegaly Respiratory: clear to auscultation bilaterally, no wheezing, no crackles. Normal respiratory effort. No accessory muscle use.  Cardiovascular: Regular rate and rhythm, no murmurs / rubs / gallops. No extremity edema. 2+ pedal pulses. No carotid bruits.  Abdomen: Abdomen is soft, nondistended, exquisitely tender to palpation in the epigastrium.  Tender to palpation in the left lower quadrant.  Positive bowel sounds.  No rebound.  No guarding. Musculoskeletal: no clubbing / cyanosis. No joint deformity upper and lower extremities. Good ROM, no contractures. Normal muscle tone.  Skin: no rashes, lesions, ulcers. No  induration Neurologic: CN 2-12 grossly intact. Sensation intact, DTR normal. Strength 5/5 in all 4.  Psychiatric: Normal judgment and insight. Alert and oriented x 3. Normal mood.  Labs on Admission: I have personally reviewed following labs and imaging studies  CBC: Recent Labs  Lab 02/08/24 1305  WBC 5.6  NEUTROABS 2.9  HGB 14.2  HCT 43.2  MCV 91.1  PLT 235    Basic Metabolic Panel: Recent Labs  Lab 02/08/24 1305  NA 135  K 3.9  CL 98  CO2 22  GLUCOSE 116*  BUN 29*  CREATININE 1.63*  CALCIUM  10.7*  GFR: Estimated Creatinine Clearance: 27.6 mL/min (A) (by C-G formula based on SCr of 1.63 mg/dL (H)).  Liver Function Tests: Recent Labs  Lab 02/08/24 1305  AST 19  ALT 19  ALKPHOS 68  BILITOT 1.1  PROT 8.8*  ALBUMIN 4.0    Urine analysis:    Component Value Date/Time   COLORURINE YELLOW 01/11/2024 0711   APPEARANCEUR CLEAR 01/11/2024 0711   LABSPEC 1.010 01/11/2024 0711   PHURINE 5.0 01/11/2024 0711   GLUCOSEU 50 (A) 01/11/2024 0711   HGBUR MODERATE (A) 01/11/2024 0711   BILIRUBINUR NEGATIVE 01/11/2024 0711   KETONESUR 5 (A) 01/11/2024 0711   PROTEINUR NEGATIVE 01/11/2024 0711   NITRITE NEGATIVE 01/11/2024 0711   LEUKOCYTESUR NEGATIVE 01/11/2024 0711    Radiological Exams on Admission: CT ABDOMEN PELVIS W CONTRAST Result Date: 02/08/2024 CLINICAL DATA:  Epigastric pain. Laparoscopic cholecystectomy proximally 1 month ago. EXAM: CT ABDOMEN AND PELVIS WITH CONTRAST TECHNIQUE: Multidetector CT imaging of the abdomen and pelvis was performed using the standard protocol following bolus administration of intravenous contrast. RADIATION DOSE REDUCTION: This exam was performed according to the departmental dose-optimization program which includes automated exposure control, adjustment of the mA and/or kV according to patient size and/or use of iterative reconstruction technique. CONTRAST:  80mL OMNIPAQUE  IOHEXOL  300 MG/ML  SOLN COMPARISON:  01/15/2024 FINDINGS:  Lower chest: Descending thoracic aortic atherosclerosis. Cylindrical bronchiectasis in both lower lobes with mild medial scarring or subsegmental atelectasis in both lower lobes. Hepatobiliary: Cholecystectomy. Trace residual fluid along the gallbladder fossa is considered within normal limits for postoperative status. No current biliary dilatation. No significant hepatic parenchymal lesion identified. Pancreas: Abnormal hypoenhancing tissue between the pancreatic head and the proximal transverse duodenum measures 3.1 by 1.8 by 1.9 cm as shown on image 31 series 2. This appears to represent a reduction in size and coalescence of prior inflammation in this region shown on 01/15/2024. I am skeptical that this is a pancreatic malignancy given the evolution in findings. No pseudocyst identified. Spleen: Unremarkable Adrenals/Urinary Tract: Unremarkable. Portions the right distal ureter are somewhat obscured by streak artifact from the right hip implant despite use of metal artifact reduction sequences. Stomach/Bowel: Widespread colonic diverticulosis extending from the cecum through the sigmoid colon, with active diverticulitis in the ascending colon anteriorly shown on image 40 series 11. In addition to inflammatory stranding there are a couple of tiny locules of gas with uncertain relationship with regard to the diverticular wall, this could reflect local micro perforation no free traveling intraperitoneal gas is readily apparent. Normal appendix. Vascular/Lymphatic: Atherosclerosis is present, including aortoiliac atherosclerotic disease. Atheromatous narrowing of the celiac trunk proximally due to dorsal plaque. Reproductive: Continued distended endometrial cavity up to 2.9 cm in stripe thickness. Although possibly from cervical stenosis, endometrial sampling is indicated to exclude endometrial carcinoma. Other: Postoperative findings related to prior port placement in the subxiphoid region. Musculoskeletal: Right  total hip prosthesis. 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 Grade 1 anterolisthesis at L4-5 appears degenerative in nature. Lower lumbar spondylosis and degenerative disc disease. IMPRESSION: 1. Acute diverticulitis of the ascending colon with a couple of tiny locules of gas with uncertain relationship with regard to the diverticular wall, this could reflect local microperforation. No free traveling intraperitoneal gas is readily apparent. No drainable abscess. 2. Abnormal hypoenhancing tissue between the pancreatic head and the proximal transverse duodenum measures 3.1 by 1.8 by 1.9 cm. This appears to represent a reduction in size and coalescence of prior inflammation in this region shown on 01/15/2024.  3. Continued distended endometrial cavity up to 2.9 cm in stripe thickness. Although possibly from cervical stenosis, clinical assessment of the cervix and endometrial sampling is indicated to exclude endometrial carcinoma. 4. 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 5. Cylindrical bronchiectasis in both lower lobes with mild medial scarring or subsegmental atelectasis in both lower lobes. 6.  Aortic Atherosclerosis (ICD10-I70.0). Electronically Signed   By: Freida Jes M.D.   On: 02/08/2024 15:43    EKG: Not done  Assessment/Plan Principal Problem:   Acute diverticulitis Active Problems:   Acute pancreatitis   Essential hypertension   Gastroesophageal reflux disease without esophagitis   Mixed hyperlipidemia   Postoperative hypothyroidism   PVD (peripheral vascular disease) (HCC)   Type 2 diabetes mellitus (HCC)   Type 2 diabetes mellitus with diabetic polyneuropathy, without long-term current use of insulin  (HCC)   Thickened endometrium   AKI (acute kidney injury) (HCC)   Dehydration   #1 acute complicated diverticulitis -Patient presented with abdominal pain noted to have left lower quadrant abdominal pain on exam also noted  to have some epigastric abdominal pain. - CT abdomen and pelvis done concerning for an acute diverticulitis with concern for local microperforation with no drainable abscess noted. - Check blood cultures x 2. -Place on IV cefepime , IV Flagyl . - Place on bowel rest, IV fluids, IV antiemetics, IV pain medication, supportive care. - General Surgical consultation.  2.  Acute pancreatitis -Patient presenting with a clinical acute pancreatitis with exquisite epigastric abdominal pain on examination. - Lipase levels elevated at 114. - Patient with recent hospitalization for biliary pancreatitis status postcholecystectomy. - LFTs within normal limits. - CT abdomen and pelvis with no significant common bile duct dilatation. - Bowel rest, IV fluids, IV pain medications, supportive care. - General Surgical consultation.  3.  Dehydration -IV fluids.  4.  Acute kidney injury on CKD stage IIIa -Likely secondary to prerenal azotemia in the setting of HCTZ, Cozaar . - Check urine sodium, urine creatinine. - Urinalysis pending. - IV fluids. - Monitor urine output. - Hold antihypertensive medications of HCTZ and Cozaar .  5.  Diabetes mellitus type 2 -Hemoglobin A1c 6.8 (01/03/2024) - Patient noted to be on Glucotrol, Januvia. - Hold oral hypoglycemic agents. - SSI.  6.  GERD -IV PPI.  7.  Hypertension -BP stable. - Continue to hold antihypertensive medications. - IV hydralazine  as needed.  8.  Thickened endometrium -Patient noted to have seen GYN oncology 01/31/2024 and scheduled for hysteroscopy, D & C on 02/29/2024. - Outpatient follow-up with GYN oncology.  9.  Hypothyroidism -Resume home regimen Synthroid .  DVT prophylaxis: Lovenox  Code Status:   Full  Family Communication:  Updated patient and daughter at bedside. Disposition Plan:   Patient is from:  Home  Anticipated DC to:  Home  Anticipated DC date:  4-5 days  Anticipated DC barriers: Clinical improvement  Consults called:  General Surgery: Dr.Metzger Admission status:  Patient observation/MedSurg  Severity of Illness: The appropriate patient status for this patient is OBSERVATION. Observation status is judged to be reasonable and necessary in order to provide the required intensity of service to ensure the patient's safety. The patient's presenting symptoms, physical exam findings, and initial radiographic and laboratory data in the context of their medical condition is felt to place them at decreased risk for further clinical deterioration. Furthermore, it is anticipated that the patient will be medically stable for discharge from the hospital within 2 midnights of admission.     Hilda Lovings MD  [facility]  How to contact the [facility] Attending or Consulting provider 7A - 7P or covering provider during after hours 7P -7A, for this patient?   Check the care team in [facility] and look for a) attending/consulting [facility] provider listed and b) the [facility] team listed Log into [email or web address] and use 's universal password to access. If you do not have the password, please contact the hospital operator. Locate the [facility] provider you are looking for under [facility] and page to a number that you can be directly reached. If you still have difficulty reaching the provider, please page the [facility] (Director on Call) for the Hospitalists listed on amion for assistance.  02/08/2024, 6:16 PM

## 2024-02-08 NOTE — ED Provider Notes (Signed)
 [facility] Provider Note  CSN: [id number] Arrival date & time: 02/08/24 1221  Chief Complaint(s) Abdominal Pain  HPI Alexandria Durham is a 78 y.o. female with past medical history as below, significant for DM, HTN, hld, cholecystectomy, CKD who presents to the ED with complaint of abd pain.  She had laparoscopic cholecystectomy on 5/19 with Dr. Landon Pinion gross; approximately 3 to 5 days after being discharged she began having worsening abdominal pain, poor appetite, nausea without vomiting.  Worsening fatigue, worsening abdominal pain.  Daughter at bedside reports that he went to surgery office this morning and advised to come to the emergency department for imaging and evaluation  Patient denies fever, yellowing of the skin, chest pain or dyspnea.  No unusual leg swelling.  Past Medical History Past Medical History:  Diagnosis Date   Arthritis    Diabetes mellitus    Hypercholesteremia    Hypertension    Patient Active Problem List   Diagnosis Date Noted   Acute diverticulitis 02/08/2024   AKI (acute kidney injury) (HCC) 02/08/2024   Dehydration 02/08/2024   Endometritis 01/19/2024   Acute pancreatitis 01/11/2024   Abdominal pain 01/06/2024   Thickened endometrium 01/06/2024   Uterine perforation 01/06/2024   Diverticulitis 01/02/2024   Acute idiopathic gout of left foot 12/23/2023   Avascular necrosis of bone of hip, left (HCC) 12/23/2023   Carpal tunnel syndrome of left wrist 09/20/2019   Bilateral carotid artery stenosis 05/10/2019   PVD (peripheral vascular disease) (HCC) 05/10/2019   Lumbar radiculopathy 07/06/2018   Lumbar spondylosis 07/06/2018   Stage 3a chronic kidney disease (HCC) 06/06/2018   Mixed hyperlipidemia 02/24/2018   Cervical radiculopathy 09/29/2017   Former smoker 05/06/2017   Postoperative hypothyroidism 04/14/2017   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   Cortical age-related cataract of both eyes 11/25/2016   Asymmetric septal  hypertrophy 04/30/2016   Gastroesophageal reflux disease without esophagitis 03/02/2016   Cardiac murmur, unspecified 02/24/2016   Benign hypertension with CKD (chronic kidney disease) stage III (HCC) 01/01/2016   Vitamin D  deficiency 12/31/2015   Essential hypertension 11/21/2015   Eczema 08/28/2015   Type 2 diabetes mellitus (HCC) 08/28/2015   Type 2 diabetes mellitus with diabetic polyneuropathy, without long-term current use of insulin  (HCC) 08/28/2015   Home Medication(s) Prior to Admission medications   Medication Sig Start Date End Date Taking? Authorizing Provider  bisacodyl  5 MG EC tablet Take 5 mg by mouth daily as needed for moderate constipation.   Yes [provider]  buPROPion  (WELLBUTRIN  XL) 150 MG 24 hr tablet Take 150 mg by mouth every evening.   Yes [provider]  Cholecalciferol  (VITAMIN D3) 25 MCG (1000 UT) CAPS Take 1,000 Units by mouth daily.   Yes [provider]  Cyanocobalamin (VITAMIN B-12 PO) Take 1 tablet by mouth in the morning.   Yes [provider]  diphenhydramine -acetaminophen  (TYLENOL  PM) 25-500 MG TABS tablet Take 2 tablets by mouth at bedtime as needed (Sleep/Pain).   Yes [provider]  ezetimibe  (ZETIA ) 10 MG tablet Take 10 mg by mouth daily. 10/29/23 10/28/24 Yes [provider]  ferrous sulfate 325 (65 FE) MG tablet Take 325 mg by mouth daily with breakfast.   Yes [provider]  fluticasone  (FLONASE ) 50 MCG/ACT nasal spray Place 2 sprays into both nostrils daily. Patient taking differently: Place 2 sprays into both nostrils daily as needed for rhinitis. 10/16/16  Yes Joy, Shawn C, PA-C  gabapentin  (NEURONTIN ) 300 MG capsule Take 600 mg by  mouth 2 (two) times daily. 08/03/20  Yes [provider]  hydrochlorothiazide  (HYDRODIURIL ) 25 MG tablet Take 1 tablet (25 mg total) by mouth daily. 01/10/24  Yes Lorita Rosa, MD  HYDROcodone -acetaminophen  (NORCO/VICODIN) 5-325 MG tablet Take 1  tablet by mouth daily as needed for moderate pain (pain score 4-6).   Yes [provider]  levothyroxine  (SYNTHROID ) 75 MCG tablet Take 75 mcg by mouth daily before breakfast. 09/22/22  Yes [provider]  losartan  (COZAAR ) 50 MG tablet Take 50 mg by mouth in the morning.   Yes [provider]  magnesium  oxide (MAG-OX) 400 MG tablet Take 400 mg by mouth daily. 03/01/17  Yes [provider]  meclizine (ANTIVERT) 25 MG tablet Take 25 mg by mouth in the morning.   Yes [provider]  methocarbamol  (ROBAXIN ) 500 MG tablet Take 1 tablet (500 mg total) by mouth 2 (two) times daily as needed for muscle spasms. 05/25/21  Yes Joldersma, Logan, PA-C  mometasone (ELOCON) 0.1 % cream Apply 1 Application topically daily as needed (Dermatitis- affected sites). 10/27/23  Yes [provider]  omeprazole (PRILOSEC) 40 MG capsule Take 40 mg by mouth daily before breakfast.   Yes [provider]  ondansetron  (ZOFRAN ) 4 MG tablet Take 1 tablet (4 mg total) by mouth every 6 (six) hours as needed for nausea. 01/21/24  Yes Danford, Willis Harter, MD  oxyCODONE  (OXY IR/ROXICODONE ) 5 MG immediate release tablet Take 1 tablet (5 mg total) by mouth every 8 (eight) hours as needed for severe pain (pain score 7-10). 01/21/24  Yes Danford, Willis Harter, MD  polycarbophil (FIBERCON) 625 MG tablet Take 1 tablet (625 mg total) by mouth 2 (two) times daily. 01/09/24  Yes Lorita Rosa, MD  polyethylene glycol powder (GLYCOLAX /MIRALAX ) 17 GM/SCOOP powder Take 17 g by mouth 2 (two) times daily as needed for moderate constipation. 08/15/15  Yes [provider]  sitaGLIPtin (JANUVIA) 50 MG tablet Take 50 mg by mouth daily. 10/28/22  Yes [provider]  traMADol  (ULTRAM ) 50 MG tablet Take 50 mg by mouth 2 (two) times daily as needed (for pain).   Yes [provider]  triamcinolone ointment (KENALOG) 0.5 % Apply 1 Application topically 2 (two) times daily  as needed (for itching).   Yes [provider]  TYLENOL  8 HOUR ARTHRITIS PAIN 650 MG CR tablet Take 650-1,300 mg by mouth every 8 (eight) hours as needed for pain.   Yes [provider]                                                                                                                                    Past Surgical History Past Surgical History:  Procedure Laterality Date   BREAST CYST EXCISION     CHOLECYSTECTOMY N/A 01/17/2024   Procedure: LAPAROSCOPIC CHOLECYSTECTOMY WITH INTRAOPERATIVE CHOLANGIOGRAM;  Surgeon: Candyce Champagne, MD;  Location: [facility];  Service: General;  Laterality: N/A;  DILATION AND CURETTAGE OF UTERUS N/A 01/17/2024   Procedure: DILATION AND CURETTAGE;  Surgeon: Derrel Flies, MD;  Location: [facility];  Service: Gynecology;  Laterality: N/A;   HEMORRHOID SURGERY     HIP SURGERY     HYSTEROSCOPY WITH D & C N/A 01/06/2024   Procedure: DILATATION AND CURETTAGE /HYSTEROSCOPY;  Surgeon: [name], MD;  Location: [facility];  Service: Gynecology;  Laterality: N/A;  PAP SMEAR (DR TO BRING SUPPLIES)  TUCKERS CARD   JOINT REPLACEMENT     hip replacement   LAPAROSCOPIC LYSIS OF ADHESIONS  01/06/2024   Procedure: LYSIS, ADHESIONS, LAPAROSCOPIC;  Surgeon: [name], MD;  Location: [facility];  Service: Gynecology;;   LAPAROSCOPY N/A 01/06/2024   Procedure: LAPAROSCOPY, DIAGNOSTIC;  Surgeon: [name], MD;  Location: [facility];  Service: Gynecology;  Laterality: N/A;   THYROID SURGERY     TONSILLECTOMY     TUBAL LIGATION     Family History Family History  Problem Relation Age of Onset   Breast cancer Neg Hx    Prostate cancer Neg Hx    Pancreatic cancer Neg Hx    Colon cancer Neg Hx    Endometrial cancer Neg Hx    Ovarian cancer Neg Hx     Social History Social History   Tobacco Use   Smoking status: Every Day    Current packs/day: 0.50    Types: Cigarettes    Passive exposure: Never   Smokeless tobacco: Never  Substance Use  Topics   Alcohol use: No   Drug use: No   Allergies Penicillins, Ace inhibitors, Tape, Aspirin, and Codeine  Review of Systems A thorough review of systems was obtained and all systems are negative except as noted in the HPI and PMH.   Physical Exam Vital Signs  I have reviewed the triage vital signs BP 119/66 (BP Location: Left Arm)   Pulse 80   Temp (!) 97.4 F (36.3 C) (Oral)   Resp 16   Ht 5\' 4"  (1.626 m)   Wt 67 kg   SpO2 99%   BMI 25.35 kg/m  Physical Exam Vitals and nursing note reviewed.  Constitutional:      General: She is not in acute distress.    Appearance: Normal appearance. She is well-developed. She is not ill-appearing.  HENT:     Head: Normocephalic and atraumatic.     Right Ear: External ear normal.     Left Ear: External ear normal.     Nose: Nose normal.     Mouth/Throat:     Mouth: Mucous membranes are moist.  Eyes:     General: No scleral icterus.       Right eye: No discharge.        Left eye: No discharge.  Cardiovascular:     Rate and Rhythm: Normal rate and regular rhythm.     Pulses: Normal pulses.  Pulmonary:     Effort: Pulmonary effort is normal. No respiratory distress.     Breath sounds: No stridor.  Abdominal:     General: Abdomen is flat. There is no distension.     Palpations: Abdomen is soft.     Tenderness: There is abdominal tenderness. There is no guarding.       Comments: Surgical incisions to abdomen appear to be healing appropriately   Musculoskeletal:        General: No deformity.     Cervical back: No rigidity.  Skin:    General: Skin is warm and dry.  Coloration: Skin is not cyanotic, jaundiced or pale.  Neurological:     Mental Status: She is alert and oriented to person, place, and time.     GCS: GCS eye subscore is 4. GCS verbal subscore is 5. GCS motor subscore is 6.  Psychiatric:        Speech: Speech normal.        Behavior: Behavior normal. Behavior is cooperative.     ED Results and  Treatments Labs (all labs ordered are listed, but only abnormal results are displayed) Labs Reviewed  COMPREHENSIVE METABOLIC PANEL WITH GFR - Abnormal; Notable for the following components:      Result Value   Glucose, Bld 116 (*)    BUN 29 (*)    Creatinine, Ser 1.63 (*)    Calcium  10.7 (*)    Total Protein 8.8 (*)    GFR, Estimated 32 (*)    All other components within normal limits  LIPASE, BLOOD - Abnormal; Notable for the following components:   Lipase 114 (*)    All other components within normal limits  URINALYSIS, ROUTINE W REFLEX MICROSCOPIC - Abnormal; Notable for the following components:   APPearance HAZY (*)    Specific Gravity, Urine 1.043 (*)    Hgb urine dipstick SMALL (*)    Ketones, ur 5 (*)    Leukocytes,Ua LARGE (*)    Bacteria, UA MANY (*)    All other components within normal limits  CULTURE, BLOOD (ROUTINE X 2)  CULTURE, BLOOD (ROUTINE X 2)  CBC WITH DIFFERENTIAL/PLATELET  GLUCOSE, CAPILLARY  GLUCOSE, CAPILLARY  SODIUM, URINE, RANDOM  CREATININE, URINE, RANDOM                                                                                                                          Radiology CT ABDOMEN PELVIS W CONTRAST Result Date: 02/08/2024 CLINICAL DATA:  Epigastric pain. Laparoscopic cholecystectomy proximally 1 month ago. EXAM: CT ABDOMEN AND PELVIS WITH CONTRAST TECHNIQUE: Multidetector CT imaging of the abdomen and pelvis was performed using the standard protocol following bolus administration of intravenous contrast. RADIATION DOSE REDUCTION: This exam was performed according to the departmental dose-optimization program which includes automated exposure control, adjustment of the mA and/or kV according to patient size and/or use of iterative reconstruction technique. CONTRAST:  80mL OMNIPAQUE  IOHEXOL  300 MG/ML  SOLN COMPARISON:  01/15/2024 FINDINGS: Lower chest: Descending thoracic aortic atherosclerosis. Cylindrical bronchiectasis in both lower lobes  with mild medial scarring or subsegmental atelectasis in both lower lobes. Hepatobiliary: Cholecystectomy. Trace residual fluid along the gallbladder fossa is considered within normal limits for postoperative status. No current biliary dilatation. No significant hepatic parenchymal lesion identified. Pancreas: Abnormal hypoenhancing tissue between the pancreatic head and the proximal transverse duodenum measures 3.1 by 1.8 by 1.9 cm as shown on image 31 series 2. This appears to represent a reduction in size and coalescence of prior inflammation in this region shown on 01/15/2024. I am skeptical that this is a pancreatic  malignancy given the evolution in findings. No pseudocyst identified. Spleen: Unremarkable Adrenals/Urinary Tract: Unremarkable. Portions the right distal ureter are somewhat obscured by streak artifact from the right hip implant despite use of metal artifact reduction sequences. Stomach/Bowel: Widespread colonic diverticulosis extending from the cecum through the sigmoid colon, with active diverticulitis in the ascending colon anteriorly shown on image 40 series 11. In addition to inflammatory stranding there are a couple of tiny locules of gas with uncertain relationship with regard to the diverticular wall, this could reflect local micro perforation no free traveling intraperitoneal gas is readily apparent. Normal appendix. Vascular/Lymphatic: Atherosclerosis is present, including aortoiliac atherosclerotic disease. Atheromatous narrowing of the celiac trunk proximally due to dorsal plaque. Reproductive: Continued distended endometrial cavity up to 2.9 cm in stripe thickness. Although possibly from cervical stenosis, endometrial sampling is indicated to exclude endometrial carcinoma. Other: Postoperative findings related to prior port placement in the subxiphoid region. Musculoskeletal: Right total hip prosthesis. 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 Grade 1  anterolisthesis at L4-5 appears degenerative in nature. Lower lumbar spondylosis and degenerative disc disease. IMPRESSION: 1. Acute diverticulitis of the ascending colon with a couple of tiny locules of gas with uncertain relationship with regard to the diverticular wall, this could reflect local microperforation. No free traveling intraperitoneal gas is readily apparent. No drainable abscess. 2. Abnormal hypoenhancing tissue between the pancreatic head and the proximal transverse duodenum measures 3.1 by 1.8 by 1.9 cm. This appears to represent a reduction in size and coalescence of prior inflammation in this region shown on 01/15/2024. 3. Continued distended endometrial cavity up to 2.9 cm in stripe thickness. Although possibly from cervical stenosis, clinical assessment of the cervix and endometrial sampling is indicated to exclude endometrial carcinoma. 4. 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 5. Cylindrical bronchiectasis in both lower lobes with mild medial scarring or subsegmental atelectasis in both lower lobes. 6.  Aortic Atherosclerosis (ICD10-I70.0). Electronically Signed   By: Freida Jes M.D.   On: 02/08/2024 15:43    Pertinent labs & imaging results that were available during my care of the patient were reviewed by me and considered in my medical decision making (see MDM for details).  Medications Ordered in ED Medications  0.9 %  sodium chloride  infusion ( Intravenous New Bag/Given 02/08/24 1804)  ceFEPIme  (MAXIPIME ) 2 g in sodium chloride  0.9 % 100 mL IVPB (has no administration in time range)  metroNIDAZOLE  (FLAGYL ) IVPB 500 mg (has no administration in time range)  insulin  aspart (novoLOG ) injection 0-9 Units ( Subcutaneous Not Given 02/08/24 1920)  sodium chloride  0.9 % bolus 1,000 mL (0 mLs Intravenous Stopped 02/08/24 1645)  morphine  (PF) 4 MG/ML injection 4 mg (4 mg Intravenous Given 02/08/24 1505)  ondansetron  (ZOFRAN ) injection 4 mg (4 mg  Intravenous Given 02/08/24 1505)  iohexol  (OMNIPAQUE ) 300 MG/ML solution 80 mL (80 mLs Intravenous Contrast Given 02/08/24 1513)  ceFEPIme  (MAXIPIME ) 2 g in sodium chloride  0.9 % 100 mL IVPB (2 g Intravenous New Bag/Given 02/08/24 1652)    And  metroNIDAZOLE  (FLAGYL ) IVPB 500 mg (500 mg Intravenous New Bag/Given 02/08/24 1735)  sodium chloride  0.9 % bolus 500 mL (500 mLs Intravenous New Bag/Given 02/08/24 1803)  Procedures .Critical Care  Performed by: Teddi Favors, DO Authorized by: Teddi Favors, DO   Critical care provider statement:    Critical care time (minutes):  30   Critical care time was exclusive of:  Separately billable procedures and treating other patients   Critical care was necessary to treat or prevent imminent or life-threatening deterioration of the following conditions:  Dehydration   Critical care was time spent personally by me on the following activities:  Development of treatment plan with patient or surrogate, discussions with consultants, evaluation of patient's response to treatment, examination of patient, ordering and review of laboratory studies, ordering and review of radiographic studies, ordering and performing treatments and interventions, pulse oximetry, re-evaluation of patient's condition, review of old charts and obtaining history from patient or surrogate   Care discussed with: admitting provider     (including critical care time)  Medical Decision Making / ED Course    Medical Decision Making:    BROOKSIE ELLWANGER is a 78 y.o. female with past medical history as below, significant for DM, HTN, hld, cholecystectomy, CKD who presents to the ED with complaint of abd pain.. The complaint involves an extensive differential diagnosis and also carries with it a high risk of complications and morbidity.  Serious etiology was  considered. Ddx includes but is not limited to: Differential diagnosis includes but is not exclusive to acute cholecystitis, intrathoracic causes for epigastric abdominal pain, gastritis, duodenitis, pancreatitis, small bowel or large bowel obstruction, abdominal aortic aneurysm, hernia, gastritis, etc.   Complete initial physical exam performed, notably the patient was in no acute distress, afebrile, HDS.    Reviewed and confirmed nursing documentation for past medical history, family history, social history.  Vital signs reviewed.     Brief summary:  78 year old female history as above here with abdominal pain, weakness, nausea, poor appetite. Family reports they were sent by surgery office for evaluation She is TTP to the right upper quadrant epigastrium Surgical sites appear to be healing appropriately No jaundice or fever  Clinical Course as of 02/08/24 2114  Tue Feb 08, 2024  1554 CT w/ acute diverticulitis, appears to be complicated by microperforation, no abscess per radiology [SG]  1554 Creatinine(!): 1.63 aki [SG]  1605 Pt with aki, poor po intake, ongoing abdominal pain with concern for complicated diverticulitis on her imaging. She is not septic, she is HDS. Started cefepime /flagyl  given penicillin allergy.  Possible superimposed pancreatitis. Plan for admission.  [SG]    Clinical Course User Index [SG] Teddi Favors, DO    Imaging does not show retained stone, bili is not elevated. Also intra-operative cholangiogram reviewed in op note was neg for retained stone.   Pt/family agreeable to plan Admit aki/complicated diverticulitis Admit [facility]             Additional history obtained: -Additional history obtained from family -External records from outside source obtained and reviewed including: Chart review including previous notes, labs, imaging, consultation notes including  Recent admission, prior labs and imaging, surgical documentation Patient had  laparoscopic cholecystectomy and D&C with hysteroscopy on 5/19 She had D&C with hysteroscopy Dr. Vallarie Gauze, uterus was unfortunately perforate and diagnostic laparoscopy was performed to look for any other injury.  See documentation from Dr. Andy Bannister and Dr. Vallarie Gauze Dr. Hershell Lose performed cholecystectomy for acute on chronic cholecystitis  Lab Tests: -I ordered, reviewed, and interpreted labs.   The pertinent results include:   Labs Reviewed  COMPREHENSIVE METABOLIC PANEL WITH GFR - Abnormal; Notable for  the following components:      Result Value   Glucose, Bld 116 (*)    BUN 29 (*)    Creatinine, Ser 1.63 (*)    Calcium  10.7 (*)    Total Protein 8.8 (*)    GFR, Estimated 32 (*)    All other components within normal limits  LIPASE, BLOOD - Abnormal; Notable for the following components:   Lipase 114 (*)    All other components within normal limits  URINALYSIS, ROUTINE W REFLEX MICROSCOPIC - Abnormal; Notable for the following components:   APPearance HAZY (*)    Specific Gravity, Urine 1.043 (*)    Hgb urine dipstick SMALL (*)    Ketones, ur 5 (*)    Leukocytes,Ua LARGE (*)    Bacteria, UA MANY (*)    All other components within normal limits  CULTURE, BLOOD (ROUTINE X 2)  CULTURE, BLOOD (ROUTINE X 2)  CBC WITH DIFFERENTIAL/PLATELET  GLUCOSE, CAPILLARY  GLUCOSE, CAPILLARY  SODIUM, URINE, RANDOM  CREATININE, URINE, RANDOM    Notable for AKI, elev BUN  EKG   EKG Interpretation Date/Time:    Ventricular Rate:    PR Interval:    QRS Duration:    QT Interval:    QTC Calculation:   R Axis:      Text Interpretation:           Imaging Studies ordered: I ordered imaging studies including CTAP I independently visualized the following imaging with scope of interpretation limited to determining acute life threatening conditions related to emergency care; findings noted above I agree with the radiologist interpretation If any imaging was obtained with contrast I closely  monitored patient for any possible adverse reaction a/w contrast administration in the emergency department   Medicines ordered and prescription drug management: Meds ordered this encounter  Medications   sodium chloride  0.9 % bolus 1,000 mL   morphine  (PF) 4 MG/ML injection 4 mg   ondansetron  (ZOFRAN ) injection 4 mg   iohexol  (OMNIPAQUE ) 300 MG/ML solution 80 mL   AND Linked Order Group    ceFEPIme  (MAXIPIME ) 2 g in sodium chloride  0.9 % 100 mL IVPB     Antibiotic Indication::   Other Indication (list below)     Other Indication::   Intra-abdominal Infection    metroNIDAZOLE  (FLAGYL ) IVPB 500 mg     Antibiotic Indication::   Intra-abdominal Infection   0.9 %  sodium chloride  infusion   sodium chloride  0.9 % bolus 500 mL   ceFEPIme  (MAXIPIME ) 2 g in sodium chloride  0.9 % 100 mL IVPB    Antibiotic Indication::   Other Indication (list below)    Other Indication::   intraabd   metroNIDAZOLE  (FLAGYL ) IVPB 500 mg    Antibiotic Indication::   Intra-abdominal Infection   insulin  aspart (novoLOG ) injection 0-9 Units    Correction coverage::   Sensitive (thin, NPO, renal)    CBG < 70::   Implement Hypoglycemia Standing Orders and refer to Hypoglycemia Standing Orders sidebar report    CBG 70 - 120::   0 units    CBG 121 - 150::   1 unit    CBG 151 - 200::   2 units    CBG 201 - 250::   3 units    CBG 251 - 300::   5 units    CBG 301 - 350::   7 units    CBG 351 - 400:   9 units    CBG > 400:   call MD  and obtain STAT lab verification    -I have reviewed the patients home medicines and have made adjustments as needed   Consultations Obtained: I requested consultation with the [facility],  and discussed lab and imaging findings as well as pertinent plan    Cardiac Monitoring: Continuous pulse oximetry interpreted by myself, 97% on RA.    Social Determinants of Health:  Diagnosis or treatment significantly limited by social determinants of health: former smoker   Reevaluation: After  the interventions noted above, I reevaluated the patient and found that they have improved  Co morbidities that complicate the patient evaluation  Past Medical History:  Diagnosis Date   Arthritis    Diabetes mellitus    Hypercholesteremia    Hypertension       Dispostion: Disposition decision including need for hospitalization was considered, and patient admitted to the hospital.    Final Clinical Impression(s) / ED Diagnoses Final diagnoses:  Diverticulitis of intestine with perforation without bleeding, unspecified part of intestinal tract  AKI (acute kidney injury) (HCC)  Failure to thrive in adult        Teddi Favors, DO 02/08/24 2114

## 2024-02-09 DIAGNOSIS — K5792 Diverticulitis of intestine, part unspecified, without perforation or abscess without bleeding: Secondary | ICD-10-CM | POA: Diagnosis not present

## 2024-02-09 LAB — CBC WITH DIFFERENTIAL/PLATELET
Abs Immature Granulocytes: 0.01 10*3/uL (ref 0.00–0.07)
Basophils Absolute: 0 10*3/uL (ref 0.0–0.1)
Basophils Relative: 1 %
Eosinophils Absolute: 0.1 10*3/uL (ref 0.0–0.5)
Eosinophils Relative: 3 %
HCT: 35.6 % — ABNORMAL LOW (ref 36.0–46.0)
Hemoglobin: 11.4 g/dL — ABNORMAL LOW (ref 12.0–15.0)
Immature Granulocytes: 0 %
Lymphocytes Relative: 46 %
Lymphs Abs: 1.9 10*3/uL (ref 0.7–4.0)
MCH: 30.8 pg (ref 26.0–34.0)
MCHC: 32 g/dL (ref 30.0–36.0)
MCV: 96.2 fL (ref 80.0–100.0)
Monocytes Absolute: 0.5 10*3/uL (ref 0.1–1.0)
Monocytes Relative: 13 %
Neutro Abs: 1.5 10*3/uL — ABNORMAL LOW (ref 1.7–7.7)
Neutrophils Relative %: 37 %
Platelets: 205 10*3/uL (ref 150–400)
RBC: 3.7 MIL/uL — ABNORMAL LOW (ref 3.87–5.11)
RDW: 14.9 % (ref 11.5–15.5)
WBC: 4.1 10*3/uL (ref 4.0–10.5)
nRBC: 0 % (ref 0.0–0.2)

## 2024-02-09 LAB — COMPREHENSIVE METABOLIC PANEL WITH GFR
ALT: 14 U/L (ref 0–44)
AST: 16 U/L (ref 15–41)
Albumin: 3 g/dL — ABNORMAL LOW (ref 3.5–5.0)
Alkaline Phosphatase: 50 U/L (ref 38–126)
Anion gap: 9 (ref 5–15)
BUN: 23 mg/dL (ref 8–23)
CO2: 22 mmol/L (ref 22–32)
Calcium: 9.2 mg/dL (ref 8.9–10.3)
Chloride: 107 mmol/L (ref 98–111)
Creatinine, Ser: 1.37 mg/dL — ABNORMAL HIGH (ref 0.44–1.00)
GFR, Estimated: 40 mL/min — ABNORMAL LOW (ref >60)
Glucose, Bld: 80 mg/dL (ref 70–99)
Potassium: 3.8 mmol/L (ref 3.5–5.1)
Sodium: 138 mmol/L (ref 135–145)
Total Bilirubin: 1 mg/dL (ref 0.0–1.2)
Total Protein: 6.5 g/dL (ref 6.5–8.1)

## 2024-02-09 LAB — GLUCOSE, CAPILLARY
Glucose-Capillary: 81 mg/dL (ref 70–99)
Glucose-Capillary: 72 mg/dL (ref 70–99)
Glucose-Capillary: 96 mg/dL (ref 70–99)
Glucose-Capillary: 196 mg/dL — ABNORMAL HIGH (ref 70–99)
Glucose-Capillary: 147 mg/dL — ABNORMAL HIGH (ref 70–99)

## 2024-02-09 LAB — LIPASE, BLOOD: Lipase: 73 U/L — ABNORMAL HIGH (ref 11–51)

## 2024-02-09 LAB — PHOSPHORUS: Phosphorus: 3.2 mg/dL (ref 2.5–4.6)

## 2024-02-09 LAB — MAGNESIUM: Magnesium: 1.5 mg/dL — ABNORMAL LOW (ref 1.7–2.4)

## 2024-02-09 MED ORDER — MAGNESIUM SULFATE 2 GM/50ML IV SOLN
2.0000 g | Freq: Once | INTRAVENOUS | Status: AC
  Administered 2024-02-09: 2 g via INTRAVENOUS
  Filled 2024-02-09: qty 50

## 2024-02-09 MED ORDER — DEXTROSE-SODIUM CHLORIDE 5-0.9 % IV SOLN: INTRAVENOUS | Status: AC

## 2024-02-09 NOTE — Progress Notes (Signed)
 PROGRESS NOTE    Alexandria Durham  OZH:[id number] DOB: 1946/05/24 DOA: 02/08/2024 PCP: Bayard Limbo, NP   Brief Narrative:  78 y.o. female with medical history significant of hypertension, diabetes mellitus type 2, hyperlipidemia, hypothyroidism, GERD, CKD stage IIIa, diverticulosis, tobacco use and recent hospitalization from 01/11/2024-01/21/2024 for acute biliary pancreatitis with acute cholecystitis status post laparoscopic cholecystectomy, status post D&C on 01/17/2024 for thickened endometrium during laparoscopic cholecystectomy presented with worsening abdominal pain.  On presentation, CT of abdomen and pelvis showed findings concerning for acute diverticulitis of the ascending colon with possible microperforation but no drainable abscess.  She was started on IV fluids and antibiotics.  General surgery was consulted.  Assessment & Plan:   Acute diverticulitis with possible microperforation - Imaging as above.  Continue broad-spectrum antibiotics. -General Surgery following.  Diet advancement as per general surgery. - Continue IV fluids, analgesics as needed  Acute pancreatitis -Patient was recently hospitalized for acute biliary pancreatitis and acute cholecystitis needing laparoscopic cholecystectomy - Lipase 114 on presentation.  LFTs within normal limits.  Imaging with no significant bile duct dilatation - Continue IV fluids and analgesics as needed  Dehydration--continue IV fluids  AKI on CKD stage IIIa - Possibly from above.  Creatinine 1.63 on admission.  Improving to 1.37 today.  Continue IV fluids.  Monitor creatinine  Hypomagnesemia - Replace.  Repeat a.m. labs  Diabetes mellitus type 2 - Glucotrol and Januvia on hold.  Continue CBGs with SSI  GERD - IV PPI  Hypertension -Blood pressure currently stable.  Antihypertensives including hydrochlorothiazide  and Cozaar  are on hold  Hypothyroidism Continue levothyroxine   Tobacco use - Continue Nutan patch  Thickened  endometrium -Patient noted to have seen GYN oncology 01/31/2024 and scheduled for hysteroscopy, D & C on 02/29/2024. - Outpatient follow-up with GYN oncology.   DVT prophylaxis: Lovenox  Code Status: Full Family Communication: None at bedside Disposition Plan: Status is: Observation The patient will require care spanning > 2 midnights and should be moved to inpatient because: Of severity of illness.  Need for IV antibiotics.    Consultants: General Surgery  Procedures: None  Antimicrobials: Cefepime  and Flagyl  from 1625 onwards   Subjective: Patient seen and examined at bedside.  Continues to have intermittent abdominal pain with nausea.  No fever or chest pain reported.  Objective: Vitals:   02/08/24 1852 02/08/24 2114 02/09/24 0145 02/09/24 0603  BP: 131/69 119/66 129/80 102/70  Pulse: 85 80 78 89  Resp: 16 16 18 18   Temp: (!) 97.5 F (36.4 C) (!) 97.4 F (36.3 C) 97.6 F (36.4 C) (!) 97.5 F (36.4 C)  TempSrc: Oral Oral Oral Oral  SpO2: 100% 99% 98% 96%  Weight:      Height:        Intake/Output Summary (Last 24 hours) at 02/09/2024 0728 Last data filed at 02/09/2024 6295 Gross per 24 hour  Intake 2830.41 ml  Output --  Net 2830.41 ml   Filed Weights   02/08/24 1242  Weight: 67 kg    Examination:  General exam: Appears calm and comfortable.   Respiratory system: Bilateral decreased breath sounds at bases, no wheezing Cardiovascular system: S1 & S2 heard, Rate controlled Gastrointestinal system: Abdomen is nondistended, soft and mildly tender.  Normal bowel sounds heard. Extremities: No cyanosis, clubbing, edema  Central nervous system: Alert and oriented.  Slow to respond.  Poor historian.  No focal neurological deficits. Moving extremities Skin: No rashes, lesions or ulcers Psychiatry: Flat affect.  Not agitated   Data Reviewed:  I have personally reviewed following labs and imaging studies  CBC: Recent Labs  Lab 02/08/24 1305  WBC 5.6  NEUTROABS  2.9  HGB 14.2  HCT 43.2  MCV 91.1  PLT 235   Basic Metabolic Panel: Recent Labs  Lab 02/08/24 1305 02/09/24 0521  NA 135 138  K 3.9 3.8  CL 98 107  CO2 22 22  GLUCOSE 116* 80  BUN 29* 23  CREATININE 1.63* 1.37*  CALCIUM  10.7* 9.2  MG  --  1.5*  PHOS  --  3.2   GFR: Estimated Creatinine Clearance: 32.9 mL/min (A) (by C-G formula based on SCr of 1.37 mg/dL (H)). Liver Function Tests: Recent Labs  Lab 02/08/24 1305 02/09/24 0521  AST 19 16  ALT 19 14  ALKPHOS 68 50  BILITOT 1.1 1.0  PROT 8.8* 6.5  ALBUMIN 4.0 3.0*   Recent Labs  Lab 02/08/24 1305 02/09/24 0521  LIPASE 114* 73*   No results for input(s): AMMONIA in the last 168 hours. Coagulation Profile: No results for input(s): INR, PROTIME in the last 168 hours. Cardiac Enzymes: No results for input(s): CKTOTAL, CKMB, CKMBINDEX, TROPONINI in the last 168 hours. BNP (last 3 results) No results for input(s): PROBNP in the last 8760 hours. HbA1C: No results for input(s): HGBA1C in the last 72 hours. CBG: Recent Labs  Lab 02/08/24 1806 02/08/24 1919 02/08/24 2357 02/09/24 0345  GLUCAP 94 87 84 81   Lipid Profile: No results for input(s): CHOL, HDL, LDLCALC, TRIG, CHOLHDL, LDLDIRECT in the last 72 hours. Thyroid Function Tests: No results for input(s): TSH, T4TOTAL, FREET4, T3FREE, THYROIDAB in the last 72 hours. Anemia Panel: No results for input(s): VITAMINB12, FOLATE, FERRITIN, TIBC, IRON, RETICCTPCT in the last 72 hours. Sepsis Labs: No results for input(s): PROCALCITON, LATICACIDVEN in the last 168 hours.  Recent Results (from the past 240 hours)  Culture, blood (Routine X 2) w Reflex to ID Panel     Status: None (Preliminary result)   Collection Time: 02/08/24  9:07 PM   Specimen: BLOOD  Result Value Ref Range Status   Specimen Description   Final    BLOOD BLOOD RIGHT ARM Performed at [facility], [address], [place], [place] [address]    Special Requests   Final    BOTTLES DRAWN AEROBIC AND ANAEROBIC Blood Culture results may not be optimal due to an inadequate volume of blood received in culture bottles Performed at [facility], [address] [place], [place] [address]    Culture   Final    NO GROWTH < 12 HOURS Performed at [facility], [address] [place], [place] [address]    Report Status PENDING  Incomplete  Culture, blood (Routine X 2) w Reflex to ID Panel     Status: None (Preliminary result)   Collection Time: 02/08/24  9:07 PM   Specimen: BLOOD  Result Value Ref Range Status   Specimen Description   Final    BLOOD BLOOD RIGHT HAND Performed at [facility], [address], [place], [place] [address]    Special Requests   Final    BOTTLES DRAWN AEROBIC AND ANAEROBIC Blood Culture results may not be optimal due to an inadequate volume of blood received in culture bottles Performed at [facility], [address], [place], [place] [address]    Culture   Final    NO GROWTH < 12 HOURS Performed at [facility], [address] [place], [place] [address]    Report Status PENDING  Incomplete         Radiology Studies: CT ABDOMEN PELVIS W CONTRAST Result Date: 02/08/2024 CLINICAL DATA:  Epigastric pain. Laparoscopic cholecystectomy proximally 1 month ago. EXAM: CT ABDOMEN AND PELVIS WITH CONTRAST TECHNIQUE: Multidetector CT imaging of the abdomen and pelvis was performed using the standard protocol following bolus administration of intravenous contrast. RADIATION DOSE REDUCTION: This exam was performed according to the departmental dose-optimization program which includes automated exposure control, adjustment of the mA and/or kV according to patient size and/or use of iterative reconstruction technique. CONTRAST:  80mL OMNIPAQUE  IOHEXOL  300 MG/ML  SOLN COMPARISON:  01/15/2024 FINDINGS: Lower chest:  Descending thoracic aortic atherosclerosis. Cylindrical bronchiectasis in both lower lobes with mild medial scarring or subsegmental atelectasis in both lower lobes. Hepatobiliary: Cholecystectomy. Trace residual fluid along the gallbladder fossa is considered within normal limits for postoperative status. No current biliary dilatation. No significant hepatic parenchymal lesion identified. Pancreas: Abnormal hypoenhancing tissue between the pancreatic head and the proximal transverse duodenum measures 3.1 by 1.8 by 1.9 cm as shown on image 31 series 2. This appears to represent a reduction in size and coalescence of prior inflammation in this region shown on 01/15/2024. I am skeptical that this is a pancreatic malignancy given the evolution in findings. No pseudocyst identified. Spleen: Unremarkable Adrenals/Urinary Tract: Unremarkable. Portions the right distal ureter are somewhat obscured by streak artifact from the right hip implant despite use of metal artifact reduction sequences. Stomach/Bowel: Widespread colonic diverticulosis extending from the cecum through the sigmoid colon, with active diverticulitis in the ascending colon anteriorly shown on image 40 series 11. In addition to inflammatory stranding there are a couple of tiny locules of gas with uncertain relationship with regard to the diverticular wall, this could reflect local micro perforation no free traveling intraperitoneal gas is readily apparent. Normal appendix. Vascular/Lymphatic: Atherosclerosis is present, including aortoiliac atherosclerotic disease. Atheromatous narrowing of the celiac trunk proximally due to dorsal plaque. Reproductive: Continued distended endometrial cavity up to 2.9 cm in stripe thickness. Although possibly from cervical stenosis, endometrial sampling is indicated to exclude endometrial carcinoma. Other: Postoperative findings related to prior port placement in the subxiphoid region. Musculoskeletal: Right total hip  prosthesis. 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 Grade 1 anterolisthesis at L4-5 appears degenerative in nature. Lower lumbar spondylosis and degenerative disc disease. IMPRESSION: 1. Acute diverticulitis of the ascending colon with a couple of tiny locules of gas with uncertain relationship with regard to the diverticular wall, this could reflect local microperforation. No free traveling intraperitoneal gas is readily apparent. No drainable abscess. 2. Abnormal hypoenhancing tissue between the pancreatic head and the proximal transverse duodenum measures 3.1 by 1.8 by 1.9 cm. This appears to represent a reduction in size and coalescence of prior inflammation in this region shown on 01/15/2024. 3. Continued distended endometrial cavity up to 2.9 cm in stripe thickness. Although possibly from cervical stenosis, clinical assessment of the cervix and endometrial sampling is indicated to exclude endometrial carcinoma. 4. 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 5. Cylindrical bronchiectasis in both lower lobes with mild medial scarring or subsegmental atelectasis in both lower lobes. 6.  Aortic Atherosclerosis (ICD10-I70.0). Electronically Signed   By: Freida Jes M.D.   On: 02/08/2024 15:43        Scheduled Meds:  buPROPion   150 mg Oral QPM   enoxaparin  (LOVENOX ) injection  30 mg Subcutaneous Q24H   gabapentin   600 mg Oral BID   insulin  aspart  0-9 Units Subcutaneous Q4H   levothyroxine   75 mcg Oral Q0600   nicotine  14 mg Transdermal Daily   pantoprazole   40 mg Oral Daily   sodium chloride  flush  3 mL Intravenous Q12H   Continuous Infusions:  sodium chloride  125 mL/hr at 02/09/24 0023   ceFEPime  (MAXIPIME ) IV     metronidazole  500 mg (02/09/24 0519)          Audria Leather, MD [facility] 02/09/2024, 7:28 AM

## 2024-02-09 NOTE — TOC Initial Note (Addendum)
 Transition of Care [facility]) - Initial/Assessment Note    Patient Details  Name: Alexandria Durham MRN: [id number] Date of Birth: 1946-06-06  Transition [facility] CM/SW Contact:    Levie Ream, RN Phone Number: 02/09/2024, 10:22 AM  Clinical Narrative:                 Spoke w/ pt in room; pt says she lives w/ her dtr Azucena Leos [phone]); she plans to return at d/c; her dtr will provide transportation; pt verified insurance/PCP; pt says she has difficulty paying for housing, and utilities; she denied food insecurity, and IPV; pt says she has cane, walker, BSC, and shower chair; pt says she previously had HH services w/ [facility] but agency did not start services b/c she had to go back to hospital; she does not have home oxygen; pt agreed to receive resources for financial assistance and social services; resources placed in d/c instructions; she will make her own appt at agencies of choice; pt verbalized understanding; TOC will follow.  Expected Discharge Plan: Home/Self Care Barriers to Discharge: Continued Medical Work up   Patient Goals and CMS Choice Patient states their goals for this hospitalization and ongoing recovery are:: home CMS Medicare.gov Compare Post Acute Care list provided to:: Patient   [place] ownership interest in [facility].provided to:: Patient    Expected Discharge Plan and Services   Discharge Planning Services: CM Consult   Living arrangements for the past 2 months: Apartment                                      Prior Living Arrangements/Services Living arrangements for the past 2 months: Apartment Lives with:: Adult Children Patient language and need for interpreter reviewed:: Yes Do you feel safe going back to the place where you live?: Yes      Need for Family Participation in Patient Care: Yes (Comment) Care giver support system in place?: Yes (comment) Current home services: DME (cane, walker, BSC, shower  chair) Criminal Activity/Legal Involvement Pertinent to Current Situation/Hospitalization: No - Comment as needed  Activities of Daily Living   ADL Screening (condition at time of admission) Independently performs ADLs?: Yes (appropriate for developmental age) Is the patient deaf or have difficulty hearing?: No Does the patient have difficulty seeing, even when wearing glasses/contacts?: No Does the patient have difficulty concentrating, remembering, or making decisions?: No  Permission Sought/Granted Permission sought to share information with : Case Manager Permission granted to share information with : Yes, Verbal Permission Granted  Share Information with NAME: Case Manager     Permission granted to share info w Relationship: Azucena Leos (dtr) [phone]     Emotional Assessment Appearance:: Appears stated age Attitude/Demeanor/Rapport: Gracious Affect (typically observed): Accepting Orientation: : Oriented to Self, Oriented to Place, Oriented to  Time, Oriented to Situation Alcohol / Substance Use: Not Applicable Psych Involvement: No (comment)  Admission diagnosis:  Failure to thrive in adult [R62.7] AKI (acute kidney injury) (HCC) [N17.9] Acute diverticulitis [K57.92] Diverticulitis of intestine with perforation without bleeding, unspecified part of intestinal tract [K57.80] Patient Active Problem List   Diagnosis Date Noted   Acute diverticulitis 02/08/2024   AKI (acute kidney injury) (HCC) 02/08/2024   Dehydration 02/08/2024   Endometritis 01/19/2024   Acute pancreatitis 01/11/2024   Abdominal pain 01/06/2024   Thickened endometrium 01/06/2024   Uterine perforation 01/06/2024   Diverticulitis 01/02/2024  Acute idiopathic gout of left foot 12/23/2023   Avascular necrosis of bone of hip, left (HCC) 12/23/2023   Carpal tunnel syndrome of left wrist 09/20/2019   Bilateral carotid artery stenosis 05/10/2019   PVD (peripheral vascular disease) (HCC) 05/10/2019    Lumbar radiculopathy 07/06/2018   Lumbar spondylosis 07/06/2018   Stage 3a chronic kidney disease (HCC) 06/06/2018   Mixed hyperlipidemia 02/24/2018   Cervical radiculopathy 09/29/2017   Former smoker 05/06/2017   Postoperative hypothyroidism 04/14/2017   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   Cortical age-related cataract of both eyes 11/25/2016   Asymmetric septal hypertrophy 04/30/2016   Gastroesophageal reflux disease without esophagitis 03/02/2016   Cardiac murmur, unspecified 02/24/2016   Benign hypertension with CKD (chronic kidney disease) stage III (HCC) 01/01/2016   Vitamin D  deficiency 12/31/2015   Essential hypertension 11/21/2015   Eczema 08/28/2015   Type 2 diabetes mellitus (HCC) 08/28/2015   Type 2 diabetes mellitus with diabetic polyneuropathy, without long-term current use of insulin  (HCC) 08/28/2015   PCP:  Bayard Limbo, NP Pharmacy:   [facility] [phone] - [place], [place] - [address] [place] [address] Phone: [phone] Fax: [phone]     Social Drivers of Health (SDOH) Social History: SDOH Screenings   Food Insecurity: No Food Insecurity (02/09/2024)  Housing: High Risk (02/09/2024)  Transportation Needs: No Transportation Needs (02/09/2024)  Utilities: At Risk (02/09/2024)  Social Connections: Patient Declined (02/08/2024)  Tobacco Use: Medium Risk (02/08/2024)   Received from [facility]   SDOH Interventions: Food Insecurity Interventions: Intervention Not Indicated, Inpatient TOC Housing Interventions: Community Resources Provided, Inpatient TOC Transportation Interventions: Intervention Not Indicated, Inpatient TOC Utilities Interventions: [employer] Provided, Inpatient TOC   Readmission Risk Interventions     No data to display

## 2024-02-09 NOTE — Plan of Care (Signed)

## 2024-02-09 NOTE — Care Management Obs Status (Signed)
 MEDICARE OBSERVATION STATUS NOTIFICATION   Patient Details  Name: Alexandria Durham MRN: [id number] Date of Birth: 1946-08-29   Medicare Observation Status Notification Given:  Yes    Levie Ream, RN 02/09/2024, 10:01 AM

## 2024-02-09 NOTE — Progress Notes (Signed)
 Subjective: Feeling better this morning.  Pain in RLQ improved.  Still with some epigastric tenderness.  No nausea or vomiting  ROS: See above, otherwise other systems negative  Objective: Vital signs in last 24 hours: Temp:  [97.4 F (36.3 C)-97.6 F (36.4 C)] 97.5 F (36.4 C) (06/11 0603) Pulse Rate:  [78-100] 89 (06/11 0603) Resp:  [16-18] 18 (06/11 0603) BP: (102-136)/(66-86) 102/70 (06/11 0603) SpO2:  [96 %-100 %] 96 % (06/11 0603) Weight:  [67 kg] 67 kg (06/10 1242) Last BM Date : 02/09/24  Intake/Output from previous day: 06/10 0701 - 06/11 0700 In: 2830.4 [P.O.:120; I.V.:1494.7; IV Piggyback:1215.7] Out: -  Intake/Output this shift: No intake/output data recorded.  PE: Gen: NAD, laying in bed Abd: soft, NT except in epigastrium, ND, +BS  Lab Results:  Recent Labs    02/08/24 1305 02/09/24 0657  WBC 5.6 4.1  HGB 14.2 11.4*  HCT 43.2 35.6*  PLT 235 205   BMET Recent Labs    02/08/24 1305 02/09/24 0521  NA 135 138  K 3.9 3.8  CL 98 107  CO2 22 22  GLUCOSE 116* 80  BUN 29* 23  CREATININE 1.63* 1.37*  CALCIUM  10.7* 9.2   PT/INR No results for input(s): LABPROT, INR in the last 72 hours. CMP     Component Value Date/Time   NA 138 02/09/2024 0521   K 3.8 02/09/2024 0521   CL 107 02/09/2024 0521   CO2 22 02/09/2024 0521   GLUCOSE 80 02/09/2024 0521   BUN 23 02/09/2024 0521   CREATININE 1.37 (H) 02/09/2024 0521   CALCIUM  9.2 02/09/2024 0521   PROT 6.5 02/09/2024 0521   ALBUMIN 3.0 (L) 02/09/2024 0521   AST 16 02/09/2024 0521   ALT 14 02/09/2024 0521   ALKPHOS 50 02/09/2024 0521   BILITOT 1.0 02/09/2024 0521   GFRNONAA 40 (L) 02/09/2024 0521   GFRAA 51 (L) 03/08/2016 1618   Lipase     Component Value Date/Time   LIPASE 73 (H) 02/09/2024 0521       Studies/Results: CT ABDOMEN PELVIS W CONTRAST Result Date: 02/08/2024 CLINICAL DATA:  Epigastric pain. Laparoscopic cholecystectomy proximally 1 month ago. EXAM: CT ABDOMEN  AND PELVIS WITH CONTRAST TECHNIQUE: Multidetector CT imaging of the abdomen and pelvis was performed using the standard protocol following bolus administration of intravenous contrast. RADIATION DOSE REDUCTION: This exam was performed according to the departmental dose-optimization program which includes automated exposure control, adjustment of the mA and/or kV according to patient size and/or use of iterative reconstruction technique. CONTRAST:  80mL OMNIPAQUE  IOHEXOL  300 MG/ML  SOLN COMPARISON:  01/15/2024 FINDINGS: Lower chest: Descending thoracic aortic atherosclerosis. Cylindrical bronchiectasis in both lower lobes with mild medial scarring or subsegmental atelectasis in both lower lobes. Hepatobiliary: Cholecystectomy. Trace residual fluid along the gallbladder fossa is considered within normal limits for postoperative status. No current biliary dilatation. No significant hepatic parenchymal lesion identified. Pancreas: Abnormal hypoenhancing tissue between the pancreatic head and the proximal transverse duodenum measures 3.1 by 1.8 by 1.9 cm as shown on image 31 series 2. This appears to represent a reduction in size and coalescence of prior inflammation in this region shown on 01/15/2024. I am skeptical that this is a pancreatic malignancy given the evolution in findings. No pseudocyst identified. Spleen: Unremarkable Adrenals/Urinary Tract: Unremarkable. Portions the right distal ureter are somewhat obscured by streak artifact from the right hip implant despite use of metal artifact reduction sequences. Stomach/Bowel: Widespread colonic diverticulosis extending from the  cecum through the sigmoid colon, with active diverticulitis in the ascending colon anteriorly shown on image 40 series 11. In addition to inflammatory stranding there are a couple of tiny locules of gas with uncertain relationship with regard to the diverticular wall, this could reflect local micro perforation no free traveling  intraperitoneal gas is readily apparent. Normal appendix. Vascular/Lymphatic: Atherosclerosis is present, including aortoiliac atherosclerotic disease. Atheromatous narrowing of the celiac trunk proximally due to dorsal plaque. Reproductive: Continued distended endometrial cavity up to 2.9 cm in stripe thickness. Although possibly from cervical stenosis, endometrial sampling is indicated to exclude endometrial carcinoma. Other: Postoperative findings related to prior port placement in the subxiphoid region. Musculoskeletal: Right total hip prosthesis. 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 Grade 1 anterolisthesis at L4-5 appears degenerative in nature. Lower lumbar spondylosis and degenerative disc disease. IMPRESSION: 1. Acute diverticulitis of the ascending colon with a couple of tiny locules of gas with uncertain relationship with regard to the diverticular wall, this could reflect local microperforation. No free traveling intraperitoneal gas is readily apparent. No drainable abscess. 2. Abnormal hypoenhancing tissue between the pancreatic head and the proximal transverse duodenum measures 3.1 by 1.8 by 1.9 cm. This appears to represent a reduction in size and coalescence of prior inflammation in this region shown on 01/15/2024. 3. Continued distended endometrial cavity up to 2.9 cm in stripe thickness. Although possibly from cervical stenosis, clinical assessment of the cervix and endometrial sampling is indicated to exclude endometrial carcinoma. 4. 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 5. Cylindrical bronchiectasis in both lower lobes with mild medial scarring or subsegmental atelectasis in both lower lobes. 6.  Aortic Atherosclerosis (ICD10-I70.0). Electronically Signed   By: Freida Jes M.D.   On: 02/08/2024 15:43    Anti-infectives: Anti-infectives (From admission, onward)    Start     Dose/Rate Route Frequency Ordered Stop   02/09/24  1000  ceFEPIme  (MAXIPIME ) 2 g in sodium chloride  0.9 % 100 mL IVPB        2 g 200 mL/hr over 30 Minutes Intravenous Daily 02/08/24 1751     02/09/24 0600  metroNIDAZOLE  (FLAGYL ) IVPB 500 mg        500 mg 100 mL/hr over 60 Minutes Intravenous 2 times daily 02/08/24 1751     02/08/24 1600  ceFEPIme  (MAXIPIME ) 2 g in sodium chloride  0.9 % 100 mL IVPB       Placed in And Linked Group   2 g 200 mL/hr over 30 Minutes Intravenous  Once 02/08/24 1556 02/08/24 1722   02/08/24 1600  metroNIDAZOLE  (FLAGYL ) IVPB 500 mg       Placed in And Linked Group   500 mg 100 mL/hr over 60 Minutes Intravenous  Once 02/08/24 1556 02/08/24 1835        Assessment/Plan Ascending colon diverticulitis with microperf Recent lap chole for presumed gallstone pancreatitis with persistent peripancreatic fluid collection  -currently having no R-sided abdominal pain.  Some tenderness in her epigastrium more consistent with her peripancreatic fluid collection/pancreatitis. -will allow CLD today and see how she does with this.  Her pancreas may be more of a limiting factor in her diet advancement than her diverticulitis, but we will continue to monitor.  FEN - CLD VTE - Lovenox  ID - Maxipime /Flagyl   I reviewed nursing notes, hospitalist notes, last 24 h vitals and pain scores, last 48 h intake and output, last 24 h labs and trends, and last 24 h imaging results.   LOS:  0 days    Leone Ralphs , [facility] 02/09/2024, 8:42 AM Please see Amion for pager number during day hours 7:00am-4:30pm or 7:00am -11:30am on weekends

## 2024-02-09 NOTE — Plan of Care (Signed)
   Problem: Clinical Measurements: Goal: Will remain free from infection Outcome: Progressing Goal: Diagnostic test results will improve Outcome: Progressing

## 2024-02-09 NOTE — Progress Notes (Signed)
 Mobility Specialist - Progress Note   02/09/24 1011  Mobility  Activity Ambulated with assistance in hallway  Level of Assistance Standby assist, set-up cues, supervision of patient - no hands on  Assistive Device Front wheel walker  Distance Ambulated (ft) 480 ft  Activity Response Tolerated well  Mobility Referral Yes  Mobility visit 1 Mobility  Mobility Specialist Start Time (ACUTE ONLY) 0935  Mobility Specialist Stop Time (ACUTE ONLY) 1011  Mobility Specialist Time Calculation (min) (ACUTE ONLY) 36 min   Pt received in bed and agreeable to mobility. No complaints during session. Pt to bed after session with all needs met.    [facility]

## 2024-02-10 DIAGNOSIS — K5792 Diverticulitis of intestine, part unspecified, without perforation or abscess without bleeding: Secondary | ICD-10-CM | POA: Diagnosis not present

## 2024-02-10 LAB — COMPREHENSIVE METABOLIC PANEL WITH GFR
ALT: 12 U/L (ref 0–44)
AST: 13 U/L — ABNORMAL LOW (ref 15–41)
Albumin: 2.9 g/dL — ABNORMAL LOW (ref 3.5–5.0)
Alkaline Phosphatase: 47 U/L (ref 38–126)
Anion gap: 9 (ref 5–15)
BUN: 13 mg/dL (ref 8–23)
CO2: 19 mmol/L — ABNORMAL LOW (ref 22–32)
Calcium: 9.2 mg/dL (ref 8.9–10.3)
Chloride: 110 mmol/L (ref 98–111)
Creatinine, Ser: 0.95 mg/dL (ref 0.44–1.00)
GFR, Estimated: >60 mL/min (ref >60)
Glucose, Bld: 154 mg/dL — ABNORMAL HIGH (ref 70–99)
Potassium: 3.5 mmol/L (ref 3.5–5.1)
Sodium: 138 mmol/L (ref 135–145)
Total Bilirubin: 0.7 mg/dL (ref 0.0–1.2)
Total Protein: 6.1 g/dL — ABNORMAL LOW (ref 6.5–8.1)

## 2024-02-10 LAB — CBC WITH DIFFERENTIAL/PLATELET
Abs Immature Granulocytes: 0.01 10*3/uL (ref 0.00–0.07)
Basophils Absolute: 0 10*3/uL (ref 0.0–0.1)
Basophils Relative: 1 %
Eosinophils Absolute: 0.1 10*3/uL (ref 0.0–0.5)
Eosinophils Relative: 2 %
HCT: 34 % — ABNORMAL LOW (ref 36.0–46.0)
Hemoglobin: 10.6 g/dL — ABNORMAL LOW (ref 12.0–15.0)
Immature Granulocytes: 0 %
Lymphocytes Relative: 25 %
Lymphs Abs: 1 10*3/uL (ref 0.7–4.0)
MCH: 30.2 pg (ref 26.0–34.0)
MCHC: 31.2 g/dL (ref 30.0–36.0)
MCV: 96.9 fL (ref 80.0–100.0)
Monocytes Absolute: 0.4 10*3/uL (ref 0.1–1.0)
Monocytes Relative: 10 %
Neutro Abs: 2.5 10*3/uL (ref 1.7–7.7)
Neutrophils Relative %: 62 %
Platelets: 196 10*3/uL (ref 150–400)
RBC: 3.51 MIL/uL — ABNORMAL LOW (ref 3.87–5.11)
RDW: 15 % (ref 11.5–15.5)
WBC: 4 10*3/uL (ref 4.0–10.5)
nRBC: 0 % (ref 0.0–0.2)

## 2024-02-10 LAB — GLUCOSE, CAPILLARY
Glucose-Capillary: 104 mg/dL — ABNORMAL HIGH (ref 70–99)
Glucose-Capillary: 116 mg/dL — ABNORMAL HIGH (ref 70–99)
Glucose-Capillary: 126 mg/dL — ABNORMAL HIGH (ref 70–99)
Glucose-Capillary: 153 mg/dL — ABNORMAL HIGH (ref 70–99)
Glucose-Capillary: 155 mg/dL — ABNORMAL HIGH (ref 70–99)
Glucose-Capillary: 166 mg/dL — ABNORMAL HIGH (ref 70–99)
Glucose-Capillary: 172 mg/dL — ABNORMAL HIGH (ref 70–99)

## 2024-02-10 LAB — C-REACTIVE PROTEIN: CRP: 2.6 mg/dL — ABNORMAL HIGH (ref <1.0)

## 2024-02-10 LAB — MAGNESIUM: Magnesium: 2 mg/dL (ref 1.7–2.4)

## 2024-02-10 MED ORDER — DEXTROSE-SODIUM CHLORIDE 5-0.9 % IV SOLN: INTRAVENOUS | Status: AC

## 2024-02-10 MED ORDER — OXYCODONE HCL 5 MG PO TABS: 5.0000 mg | ORAL_TABLET | ORAL | Status: DC | PRN

## 2024-02-10 MED ORDER — ENOXAPARIN SODIUM 40 MG/0.4ML IJ SOSY
40.0000 mg | PREFILLED_SYRINGE | INTRAMUSCULAR | Status: DC
  Administered 2024-02-10 – 2024-02-14 (×5): 40 mg via SUBCUTANEOUS
  Filled 2024-02-10 (×5): qty 0.4

## 2024-02-10 NOTE — Evaluation (Signed)
 Physical Therapy Evaluation Patient Details Name: Alexandria Durham MRN: [id number] DOB: 1946-06-04 Today's Date: 02/10/2024  History of Present Illness  78 y.o. female with recent hospitalization from 01/11/2024-01/21/2024 for acute biliary pancreatitis with acute cholecystitis status post laparoscopic cholecystectomy, status post D&C on 01/17/2024 for thickened endometrium during laparoscopic cholecystectomy presented with worsening abdominal pain.   CT showed  acute diverticulitis of the ascending colon with possible microperforation but no drainable abscess.  She was started on IV fluids and antibiotics. Pt  with medical history significant of hypertension, diabetes mellitus type 2, hyperlipidemia, hypothyroidism, GERD, CKD stage IIIa, diverticulosis, tobacco use  Clinical Impression  Pt admitted with above diagnosis. Pt ambulated 180' with RW with supervision, no loss of balance, distance limited by fatigue. Pt currently with functional limitations due to the deficits listed below (see PT Problem List). Pt will benefit from acute skilled PT to increase their independence and safety with mobility to allow discharge.           If plan is discharge home, recommend the following: Help with stairs or ramp for entrance;Assistance with cooking/housework;A little help with bathing/dressing/bathroom   Can travel by private vehicle        Equipment Recommendations None recommended by PT  Recommendations for Other Services       Functional Status Assessment Patient has had a recent decline in their functional status and demonstrates the ability to make significant improvements in function in a reasonable and predictable amount of time.     Precautions / Restrictions Precautions Precautions: Fall Recall of Precautions/Restrictions: Intact Precaution/Restrictions Comments: pt denies falls in past 6 months Restrictions Weight Bearing Restrictions Per Provider Order: No      Mobility  Bed  Mobility Overal bed mobility: Modified Independent Bed Mobility: Supine to Sit     Supine to sit: HOB elevated, Used rails, Supervision Sit to supine: Min assist   General bed mobility comments: min A for LEs into bed    Transfers Overall transfer level: Needs assistance Equipment used: Rolling walker (2 wheels) Transfers: Sit to/from Stand Sit to Stand: Supervision           General transfer comment: cues for hand placement, used rail at toilet    Ambulation/Gait Ambulation/Gait assistance: Supervision Gait Distance (Feet): 180 Feet Assistive device: Rolling walker (2 wheels) Gait Pattern/deviations: Step-through pattern, Decreased stride length, Decreased stance time - left, Trunk flexed Gait velocity: decr     General Gait Details: steady, no loss of balance, VCs for positioning in RW  Stairs            Wheelchair Mobility     Tilt Bed    Modified Rankin (Stroke Patients Only)       Balance Overall balance assessment: Modified Independent                                           Pertinent Vitals/Pain Pain Assessment Pain Assessment: No/denies pain    Home Living Family/patient expects to be discharged to:: Private residence Living Arrangements: Children Available Help at Discharge: Family;Available PRN/intermittently Type of Home: House Home Access: Stairs to enter Entrance Stairs-Rails: Right Entrance Stairs-Number of Steps: 6-8 Alternate Level Stairs-Number of Steps: 1 step up to laundry and kitchen Home Layout: Multi-level Home Equipment: Cane - single Librarian, academic (2 wheels) Additional Comments: pt plans to d/c home with her daughter (as above)  Prior Function Prior Level of Function : Independent/Modified Independent             Mobility Comments: was ambulating  w/ RW ADLs Comments: dtr assisting since DC fr. [facility]     Extremity/Trunk Assessment   Upper Extremity Assessment Upper Extremity  Assessment: Defer to OT evaluation RUE Deficits / Details: pt reports chronic R shoulder strength and ROM issues as pt reports she needs a R shoulder surgery    Lower Extremity Assessment Lower Extremity Assessment: Overall WFL for tasks assessed    Cervical / Trunk Assessment Cervical / Trunk Assessment: Normal  Communication   Communication Communication: No apparent difficulties    Cognition Arousal: Alert Behavior During Therapy: Flat affect   PT - Cognitive impairments: No apparent impairments                         Following commands: Intact       Cueing Cueing Techniques: Verbal cues     General Comments      Exercises     Assessment/Plan    PT Assessment Patient needs continued PT services  PT Problem List Decreased strength;Decreased activity tolerance;Decreased mobility;Decreased knowledge of use of DME       PT Treatment Interventions DME instruction;Gait training;Balance training;Stair training;Functional mobility training;Patient/family education;Therapeutic activities;Therapeutic exercise    PT Goals (Current goals can be found in the Care Plan section)  Acute Rehab PT Goals Patient Stated Goal: likes to go to [employer] and visit her friend PT Goal Formulation: With patient Time For Goal Achievement: 02/01/24 Potential to Achieve Goals: Good    Frequency Min 3X/week     Co-evaluation               AM-PAC PT 6 Clicks Mobility  Outcome Measure Help needed turning from your back to your side while in a flat bed without using bedrails?: None Help needed moving from lying on your back to sitting on the side of a flat bed without using bedrails?: A Little Help needed moving to and from a bed to a chair (including a wheelchair)?: None Help needed standing up from a chair using your arms (e.g., wheelchair or bedside chair)?: None Help needed to walk in hospital room?: None Help needed climbing 3-5 steps with a railing? : A  Little 6 Click Score: 22    End of Session Equipment Utilized During Treatment: Gait belt Activity Tolerance: Patient tolerated treatment well;Patient limited by pain Patient left: in bed;with family/visitor present;with call bell/phone within reach Nurse Communication: Mobility status PT Visit Diagnosis: Difficulty in walking, not elsewhere classified (R26.2)    Time: 1610-9604 PT Time Calculation (min) (ACUTE ONLY): 19 min   Charges:   PT Evaluation $PT Eval Moderate Complexity: 1 Mod   PT General Charges $$ ACUTE PT VISIT: 1 Visit        Daymon Evans PT 02/10/2024  Acute Rehabilitation Services  Office [phone]

## 2024-02-10 NOTE — Progress Notes (Signed)
 PROGRESS NOTE    Alexandria Durham  WUJ:[id number] DOB: 1946/05/04 DOA: 02/08/2024 PCP: Bayard Limbo, NP   Brief Narrative:  78 y.o. female with medical history significant of hypertension, diabetes mellitus type 2, hyperlipidemia, hypothyroidism, GERD, CKD stage IIIa, diverticulosis, tobacco use and recent hospitalization from 01/11/2024-01/21/2024 for acute biliary pancreatitis with acute cholecystitis status post laparoscopic cholecystectomy, status post D&C on 01/17/2024 for thickened endometrium during laparoscopic cholecystectomy presented with worsening abdominal pain.  On presentation, CT of abdomen and pelvis showed findings concerning for acute diverticulitis of the ascending colon with possible microperforation but no drainable abscess.  She was started on IV fluids and antibiotics.  General surgery was consulted.  Assessment & Plan:   Acute diverticulitis with possible microperforation - Imaging as above.  Continue broad-spectrum antibiotics. -General Surgery following.  Diet advancement as per general surgery: Currently on liquid diet. - Continue IV fluids, analgesics as needed  Acute pancreatitis -Patient was recently hospitalized for acute biliary pancreatitis and acute cholecystitis needing laparoscopic cholecystectomy - Lipase 114 on presentation.  LFTs within normal limits.  Imaging with no significant bile duct dilatation - Continue IV fluids and analgesics as needed  Dehydration--continue IV fluids  AKI on CKD stage IIIa - Possibly from above.  Creatinine 1.63 on admission.  Improving.  Labs pending today.  Continue IV fluids.  Monitor creatinine  Hypomagnesemia - Labs pending today Diabetes mellitus type 2 - Glucotrol and Januvia on hold.  Continue CBGs with SSI  GERD - Continue IV PPI  Hypertension -Blood pressure currently stable.  Antihypertensives including hydrochlorothiazide  and Cozaar  are on hold  Hypothyroidism Continue levothyroxine   Tobacco use -  Continue nicotine patch  Thickened endometrium -Patient noted to have seen GYN oncology 01/31/2024 and scheduled for hysteroscopy, D & C on 02/29/2024. - Outpatient follow-up with GYN oncology.   DVT prophylaxis: Lovenox  Code Status: Full Family Communication: None at bedside Disposition Plan: Status is: Observation The patient will require care spanning > 2 midnights and should be moved to inpatient because: Of severity of illness.  Need for IV antibiotics.    Consultants: General Surgery  Procedures: None  Antimicrobials: Cefepime  and Flagyl  from 02/08/2024 onwards   Subjective: Patient seen and examined at bedside.  Feels slightly better.  Still having intermittent abdominal pain with nausea.  No fever, chest pain or shortness breath reported. Objective: Vitals:   02/09/24 1444 02/09/24 2014 02/10/24 0036 02/10/24 0444  BP: (!) 90/55 120/71 (!) 107/59 136/68  Pulse: 88 77 79 76  Resp: 17 16 17 16   Temp: 98 F (36.7 C) (!) 97.5 F (36.4 C) 98 F (36.7 C) (!) 97.5 F (36.4 C)  TempSrc: Oral Oral Oral Oral  SpO2: 96% 100% 92% 97%  Weight:      Height:        Intake/Output Summary (Last 24 hours) at 02/10/2024 0813 Last data filed at 02/10/2024 0444 Gross per 24 hour  Intake 2356.93 ml  Output 1200 ml  Net 1156.93 ml   Filed Weights   02/08/24 1242  Weight: 67 kg    Examination:  General: On room air.  No distress ENT/neck: No thyromegaly.  JVD is not elevated  respiratory: Decreased breath sounds at bases bilaterally with some crackles; no wheezing  CVS: S1-S2 heard, rate controlled currently Abdominal: Soft, slightly tender, slightly distended; no organomegaly, bowel sounds are heard Extremities: Trace lower extremity edema; no cyanosis  CNS: Awake and alert.  Still slow to respond and a poor historian.  No focal neurologic deficit.  Moves extremities Lymph: No obvious lymphadenopathy Skin: No obvious ecchymosis/lesions  psych: Affect is mostly flat.  Not  agitated currently. musculoskeletal: No obvious joint swelling/deformity    Data Reviewed: I have personally reviewed following labs and imaging studies  CBC: Recent Labs  Lab 02/08/24 1305 02/09/24 0657 02/10/24 0524  WBC 5.6 4.1 4.0  NEUTROABS 2.9 1.5* 2.5  HGB 14.2 11.4* 10.6*  HCT 43.2 35.6* 34.0*  MCV 91.1 96.2 96.9  PLT 235 205 196   Basic Metabolic Panel: Recent Labs  Lab 02/08/24 1305 02/09/24 0521  NA 135 138  K 3.9 3.8  CL 98 107  CO2 22 22  GLUCOSE 116* 80  BUN 29* 23  CREATININE 1.63* 1.37*  CALCIUM  10.7* 9.2  MG  --  1.5*  PHOS  --  3.2   GFR: Estimated Creatinine Clearance: 32.9 mL/min (A) (by C-G formula based on SCr of 1.37 mg/dL (H)). Liver Function Tests: Recent Labs  Lab 02/08/24 1305 02/09/24 0521  AST 19 16  ALT 19 14  ALKPHOS 68 50  BILITOT 1.1 1.0  PROT 8.8* 6.5  ALBUMIN 4.0 3.0*   Recent Labs  Lab 02/08/24 1305 02/09/24 0521  LIPASE 114* 73*   No results for input(s): AMMONIA in the last 168 hours. Coagulation Profile: No results for input(s): INR, PROTIME in the last 168 hours. Cardiac Enzymes: No results for input(s): CKTOTAL, CKMB, CKMBINDEX, TROPONINI in the last 168 hours. BNP (last 3 results) No results for input(s): PROBNP in the last 8760 hours. HbA1C: No results for input(s): HGBA1C in the last 72 hours. CBG: Recent Labs  Lab 02/09/24 1612 02/09/24 1959 02/10/24 0034 02/10/24 0438 02/10/24 0738  GLUCAP 196* 147* 153* 172* 126*   Lipid Profile: No results for input(s): CHOL, HDL, LDLCALC, TRIG, CHOLHDL, LDLDIRECT in the last 72 hours. Thyroid Function Tests: No results for input(s): TSH, T4TOTAL, FREET4, T3FREE, THYROIDAB in the last 72 hours. Anemia Panel: No results for input(s): VITAMINB12, FOLATE, FERRITIN, TIBC, IRON, RETICCTPCT in the last 72 hours. Sepsis Labs: No results for input(s): PROCALCITON, LATICACIDVEN in the last 168  hours.  Recent Results (from the past 240 hours)  Culture, blood (Routine X 2) w Reflex to ID Panel     Status: None (Preliminary result)   Collection Time: 02/08/24  9:07 PM   Specimen: BLOOD  Result Value Ref Range Status   Specimen Description   Final    BLOOD BLOOD RIGHT ARM Performed at [facility], [address] [place], [place] [address]    Special Requests   Final    BOTTLES DRAWN AEROBIC AND ANAEROBIC Blood Culture results may not be optimal due to an inadequate volume of blood received in culture bottles Performed at [facility], [address], [place], [place] [address]    Culture   Final    NO GROWTH 2 DAYS Performed at [facility], [address] [place], [place] [address]    Report Status PENDING  Incomplete  Culture, blood (Routine X 2) w Reflex to ID Panel     Status: None (Preliminary result)   Collection Time: 02/08/24  9:07 PM   Specimen: BLOOD  Result Value Ref Range Status   Specimen Description   Final    BLOOD BLOOD RIGHT HAND Performed at [facility], [address] [place], [place] [address]    Special Requests   Final    BOTTLES DRAWN AEROBIC AND ANAEROBIC Blood Culture results may not  be optimal due to an inadequate volume of blood received in culture bottles Performed at [facility], [address], [place], [place] [address]    Culture   Final    NO GROWTH 2 DAYS Performed at [facility], [address], [place], [place] [address]    Report Status PENDING  Incomplete         Radiology Studies: CT ABDOMEN PELVIS W CONTRAST Result Date: 02/08/2024 CLINICAL DATA:  Epigastric pain. Laparoscopic cholecystectomy proximally 1 month ago. EXAM: CT ABDOMEN AND PELVIS WITH CONTRAST TECHNIQUE: Multidetector CT imaging of the abdomen and pelvis was performed using the standard protocol following bolus administration of intravenous contrast. RADIATION DOSE  REDUCTION: This exam was performed according to the departmental dose-optimization program which includes automated exposure control, adjustment of the mA and/or kV according to patient size and/or use of iterative reconstruction technique. CONTRAST:  80mL OMNIPAQUE  IOHEXOL  300 MG/ML  SOLN COMPARISON:  01/15/2024 FINDINGS: Lower chest: Descending thoracic aortic atherosclerosis. Cylindrical bronchiectasis in both lower lobes with mild medial scarring or subsegmental atelectasis in both lower lobes. Hepatobiliary: Cholecystectomy. Trace residual fluid along the gallbladder fossa is considered within normal limits for postoperative status. No current biliary dilatation. No significant hepatic parenchymal lesion identified. Pancreas: Abnormal hypoenhancing tissue between the pancreatic head and the proximal transverse duodenum measures 3.1 by 1.8 by 1.9 cm as shown on image 31 series 2. This appears to represent a reduction in size and coalescence of prior inflammation in this region shown on 01/15/2024. I am skeptical that this is a pancreatic malignancy given the evolution in findings. No pseudocyst identified. Spleen: Unremarkable Adrenals/Urinary Tract: Unremarkable. Portions the right distal ureter are somewhat obscured by streak artifact from the right hip implant despite use of metal artifact reduction sequences. Stomach/Bowel: Widespread colonic diverticulosis extending from the cecum through the sigmoid colon, with active diverticulitis in the ascending colon anteriorly shown on image 40 series 11. In addition to inflammatory stranding there are a couple of tiny locules of gas with uncertain relationship with regard to the diverticular wall, this could reflect local micro perforation no free traveling intraperitoneal gas is readily apparent. Normal appendix. Vascular/Lymphatic: Atherosclerosis is present, including aortoiliac atherosclerotic disease. Atheromatous narrowing of the celiac trunk proximally due to  dorsal plaque. Reproductive: Continued distended endometrial cavity up to 2.9 cm in stripe thickness. Although possibly from cervical stenosis, endometrial sampling is indicated to exclude endometrial carcinoma. Other: Postoperative findings related to prior port placement in the subxiphoid region. Musculoskeletal: Right total hip prosthesis. 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 Grade 1 anterolisthesis at L4-5 appears degenerative in nature. Lower lumbar spondylosis and degenerative disc disease. IMPRESSION: 1. Acute diverticulitis of the ascending colon with a couple of tiny locules of gas with uncertain relationship with regard to the diverticular wall, this could reflect local microperforation. No free traveling intraperitoneal gas is readily apparent. No drainable abscess. 2. Abnormal hypoenhancing tissue between the pancreatic head and the proximal transverse duodenum measures 3.1 by 1.8 by 1.9 cm. This appears to represent a reduction in size and coalescence of prior inflammation in this region shown on 01/15/2024. 3. Continued distended endometrial cavity up to 2.9 cm in stripe thickness. Although possibly from cervical stenosis, clinical assessment of the cervix and endometrial sampling is indicated to exclude endometrial carcinoma. 4. 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 5. Cylindrical bronchiectasis in both lower lobes with mild medial scarring or subsegmental atelectasis in both lower lobes. 6.  Aortic Atherosclerosis (ICD10-I70.0). Electronically Signed   By: Freida Jes M.D.   On: 02/08/2024 15:43        Scheduled Meds:  buPROPion   150 mg Oral QPM   enoxaparin  (LOVENOX ) injection  30 mg Subcutaneous Q24H   gabapentin   600 mg Oral BID   insulin  aspart  0-9 Units Subcutaneous Q4H   levothyroxine   75 mcg Oral Q0600   nicotine  14 mg Transdermal Daily   pantoprazole   40 mg Oral Daily   sodium chloride  flush  3 mL  Intravenous Q12H   Continuous Infusions:  ceFEPime  (MAXIPIME ) IV 2 g (02/09/24 0938)   dextrose  5 % and 0.9 % NaCl 100 mL/hr at 02/09/24 2335   metronidazole  500 mg (02/10/24 0530)          Audria Leather, MD [facility] 02/10/2024, 8:13 AM

## 2024-02-10 NOTE — Progress Notes (Signed)
 Mobility Specialist - Progress Note   02/10/24 0956  Mobility  Activity Ambulated with assistance in hallway  Level of Assistance Standby assist, set-up cues, supervision of patient - no hands on  Assistive Device Front wheel walker  Distance Ambulated (ft) 250 ft  Activity Response Tolerated well  Mobility Referral Yes  Mobility visit 1 Mobility  Mobility Specialist Start Time (ACUTE ONLY) [id number]  Mobility Specialist Stop Time (ACUTE ONLY) 0950  Mobility Specialist Time Calculation (min) (ACUTE ONLY) 26 min   Pt received in bed and agreeable to mobility. No complaints during session. Pt to bed after session with all needs met.    [facility]

## 2024-02-10 NOTE — Plan of Care (Signed)
   Problem: Elimination: Goal: Will not experience complications related to urinary retention Outcome: Progressing   Problem: Safety: Goal: Ability to remain free from injury will improve Outcome: Progressing   Problem: Skin Integrity: Goal: Risk for impaired skin integrity will decrease Outcome: Progressing

## 2024-02-10 NOTE — Progress Notes (Addendum)
 Subjective: Had some N/V after pain meds yesterday, but otherwise tolerating CLD.  Pain still more in epigastrium, but minimal R-sided abdominal pain  ROS: See above, otherwise other systems negative  Objective: Vital signs in last 24 hours: Temp:  [97.5 F (36.4 C)-98 F (36.7 C)] 97.5 F (36.4 C) (06/12 0444) Pulse Rate:  [76-88] 76 (06/12 0444) Resp:  [16-17] 16 (06/12 0444) BP: (90-136)/(55-71) 136/68 (06/12 0444) SpO2:  [92 %-100 %] 97 % (06/12 0444) Last BM Date : 02/09/24  Intake/Output from previous day: 06/11 0701 - 06/12 0700 In: 2356.9 [P.O.:900; I.V.:1309.7; IV Piggyback:147.3] Out: 1200 [Urine:800; Emesis/NG output:400] Intake/Output this shift: No intake/output data recorded.  PE: Gen: NAD, laying in bed Abd: soft, mild tenderness in epigastrium, minimal on right side, ND, +BS  Lab Results:  Recent Labs    02/09/24 0657 02/10/24 0524  WBC 4.1 4.0  HGB 11.4* 10.6*  HCT 35.6* 34.0*  PLT 205 196   BMET Recent Labs    02/09/24 0521 02/10/24 0524  NA 138 138  K 3.8 3.5  CL 107 110  CO2 22 19*  GLUCOSE 80 154*  BUN 23 13  CREATININE 1.37* 0.95  CALCIUM  9.2 9.2   PT/INR No results for input(s): LABPROT, INR in the last 72 hours. CMP     Component Value Date/Time   NA 138 02/10/2024 0524   K 3.5 02/10/2024 0524   CL 110 02/10/2024 0524   CO2 19 (L) 02/10/2024 0524   GLUCOSE 154 (H) 02/10/2024 0524   BUN 13 02/10/2024 0524   CREATININE 0.95 02/10/2024 0524   CALCIUM  9.2 02/10/2024 0524   PROT 6.1 (L) 02/10/2024 0524   ALBUMIN 2.9 (L) 02/10/2024 0524   AST 13 (L) 02/10/2024 0524   ALT 12 02/10/2024 0524   ALKPHOS 47 02/10/2024 0524   BILITOT 0.7 02/10/2024 0524   GFRNONAA >60 02/10/2024 0524   GFRAA 51 (L) 03/08/2016 1618   Lipase     Component Value Date/Time   LIPASE 73 (H) 02/09/2024 0521       Studies/Results: CT ABDOMEN PELVIS W CONTRAST Result Date: 02/08/2024 CLINICAL DATA:  Epigastric pain. Laparoscopic  cholecystectomy proximally 1 month ago. EXAM: CT ABDOMEN AND PELVIS WITH CONTRAST TECHNIQUE: Multidetector CT imaging of the abdomen and pelvis was performed using the standard protocol following bolus administration of intravenous contrast. RADIATION DOSE REDUCTION: This exam was performed according to the departmental dose-optimization program which includes automated exposure control, adjustment of the mA and/or kV according to patient size and/or use of iterative reconstruction technique. CONTRAST:  80mL OMNIPAQUE  IOHEXOL  300 MG/ML  SOLN COMPARISON:  01/15/2024 FINDINGS: Lower chest: Descending thoracic aortic atherosclerosis. Cylindrical bronchiectasis in both lower lobes with mild medial scarring or subsegmental atelectasis in both lower lobes. Hepatobiliary: Cholecystectomy. Trace residual fluid along the gallbladder fossa is considered within normal limits for postoperative status. No current biliary dilatation. No significant hepatic parenchymal lesion identified. Pancreas: Abnormal hypoenhancing tissue between the pancreatic head and the proximal transverse duodenum measures 3.1 by 1.8 by 1.9 cm as shown on image 31 series 2. This appears to represent a reduction in size and coalescence of prior inflammation in this region shown on 01/15/2024. I am skeptical that this is a pancreatic malignancy given the evolution in findings. No pseudocyst identified. Spleen: Unremarkable Adrenals/Urinary Tract: Unremarkable. Portions the right distal ureter are somewhat obscured by streak artifact from the right hip implant despite use of metal artifact reduction sequences. Stomach/Bowel: Widespread colonic diverticulosis extending  from the cecum through the sigmoid colon, with active diverticulitis in the ascending colon anteriorly shown on image 40 series 11. In addition to inflammatory stranding there are a couple of tiny locules of gas with uncertain relationship with regard to the diverticular wall, this could  reflect local micro perforation no free traveling intraperitoneal gas is readily apparent. Normal appendix. Vascular/Lymphatic: Atherosclerosis is present, including aortoiliac atherosclerotic disease. Atheromatous narrowing of the celiac trunk proximally due to dorsal plaque. Reproductive: Continued distended endometrial cavity up to 2.9 cm in stripe thickness. Although possibly from cervical stenosis, endometrial sampling is indicated to exclude endometrial carcinoma. Other: Postoperative findings related to prior port placement in the subxiphoid region. Musculoskeletal: Right total hip prosthesis. 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 Grade 1 anterolisthesis at L4-5 appears degenerative in nature. Lower lumbar spondylosis and degenerative disc disease. IMPRESSION: 1. Acute diverticulitis of the ascending colon with a couple of tiny locules of gas with uncertain relationship with regard to the diverticular wall, this could reflect local microperforation. No free traveling intraperitoneal gas is readily apparent. No drainable abscess. 2. Abnormal hypoenhancing tissue between the pancreatic head and the proximal transverse duodenum measures 3.1 by 1.8 by 1.9 cm. This appears to represent a reduction in size and coalescence of prior inflammation in this region shown on 01/15/2024. 3. Continued distended endometrial cavity up to 2.9 cm in stripe thickness. Although possibly from cervical stenosis, clinical assessment of the cervix and endometrial sampling is indicated to exclude endometrial carcinoma. 4. 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 5. Cylindrical bronchiectasis in both lower lobes with mild medial scarring or subsegmental atelectasis in both lower lobes. 6.  Aortic Atherosclerosis (ICD10-I70.0). Electronically Signed   By: Freida Jes M.D.   On: 02/08/2024 15:43    Anti-infectives: Anti-infectives (From admission, onward)    Start      Dose/Rate Route Frequency Ordered Stop   02/09/24 1000  ceFEPIme  (MAXIPIME ) 2 g in sodium chloride  0.9 % 100 mL IVPB        2 g 200 mL/hr over 30 Minutes Intravenous Daily 02/08/24 1751     02/09/24 0600  metroNIDAZOLE  (FLAGYL ) IVPB 500 mg        500 mg 100 mL/hr over 60 Minutes Intravenous 2 times daily 02/08/24 1751     02/08/24 1600  ceFEPIme  (MAXIPIME ) 2 g in sodium chloride  0.9 % 100 mL IVPB       Placed in And Linked Group   2 g 200 mL/hr over 30 Minutes Intravenous  Once 02/08/24 1556 02/08/24 1722   02/08/24 1600  metroNIDAZOLE  (FLAGYL ) IVPB 500 mg       Placed in And Linked Group   500 mg 100 mL/hr over 60 Minutes Intravenous  Once 02/08/24 1556 02/08/24 1835        Assessment/Plan Ascending colon diverticulitis with microperf Recent lap chole for presumed gallstone pancreatitis with persistent peripancreatic fluid collection  -some N/V yesterday, but after pain meds.  Otherwise tolerating CLD. -will adv to FLD and see how that goes.  May have some component of chronic pancreatitis, but unclear -mobilize, pulm toilet -labs reviewed, WBC normal -cont abx therapy for diverticulitis -d/w primary service  FEN - FLD VTE - Lovenox  ID - Maxipime /Flagyl   I reviewed nursing notes, hospitalist notes, last 24 h vitals and pain scores, last 48 h intake and output, last 24 h labs and trends, and last 24 h imaging results.   LOS: 0 days    Loetta Ringer  Baird Bombard , [facility] Surgery 02/10/2024, 8:35 AM Please see Amion for pager number during day hours 7:00am-4:30pm or 7:00am -11:30am on weekends

## 2024-02-10 NOTE — TOC Progression Note (Signed)
 Transition of Care [facility]) - Progression Note    Patient Details  Name: Alexandria Durham MRN: [id number] Date of Birth: 09-Jan-1946  [facility] CM/SW Contact  Bari Leys, RN Phone Number: 02/10/2024, 2:43 PM  Clinical Narrative:  PT eval completed, recommendation for [facility] PT. Met with patient at bedside, agreeable, prefers [facility], as she was referred on previous admission but readmitted prior to start of service. Gasper Karst, rep-Cory, accepted referral for [facility] PT.      Expected Discharge Plan: Home/Self Care Barriers to Discharge: Continued Medical Work up  Expected Discharge Plan and Services   Discharge Planning Services: CM Consult   Living arrangements for the past 2 months: Apartment                                       Social Determinants of Health (SDOH) Interventions SDOH Screenings   Food Insecurity: No Food Insecurity (02/09/2024)  Housing: High Risk (02/09/2024)  Transportation Needs: No Transportation Needs (02/09/2024)  Utilities: At Risk (02/09/2024)  Social Connections: Patient Declined (02/08/2024)  Tobacco Use: Medium Risk (02/08/2024)   Received from [facility]    Readmission Risk Interventions     No data to display

## 2024-02-11 ENCOUNTER — Observation Stay (HOSPITAL_COMMUNITY)

## 2024-02-11 DIAGNOSIS — K5792 Diverticulitis of intestine, part unspecified, without perforation or abscess without bleeding: Secondary | ICD-10-CM | POA: Diagnosis not present

## 2024-02-11 LAB — CBC WITH DIFFERENTIAL/PLATELET
Abs Immature Granulocytes: 0.01 10*3/uL (ref 0.00–0.07)
Basophils Absolute: 0 10*3/uL (ref 0.0–0.1)
Basophils Relative: 0 %
Eosinophils Absolute: 0.1 10*3/uL (ref 0.0–0.5)
Eosinophils Relative: 3 %
HCT: 30.1 % — ABNORMAL LOW (ref 36.0–46.0)
Hemoglobin: 9.7 g/dL — ABNORMAL LOW (ref 12.0–15.0)
Immature Granulocytes: 0 %
Lymphocytes Relative: 38 %
Lymphs Abs: 1.5 10*3/uL (ref 0.7–4.0)
MCH: 30.2 pg (ref 26.0–34.0)
MCHC: 32.2 g/dL (ref 30.0–36.0)
MCV: 93.8 fL (ref 80.0–100.0)
Monocytes Absolute: 0.6 10*3/uL (ref 0.1–1.0)
Monocytes Relative: 15 %
Neutro Abs: 1.7 10*3/uL (ref 1.7–7.7)
Neutrophils Relative %: 44 %
Platelets: 200 10*3/uL (ref 150–400)
RBC: 3.21 MIL/uL — ABNORMAL LOW (ref 3.87–5.11)
RDW: 15 % (ref 11.5–15.5)
WBC: 4 10*3/uL (ref 4.0–10.5)
nRBC: 0 % (ref 0.0–0.2)

## 2024-02-11 LAB — GLUCOSE, CAPILLARY
Glucose-Capillary: 100 mg/dL — ABNORMAL HIGH (ref 70–99)
Glucose-Capillary: 109 mg/dL — ABNORMAL HIGH (ref 70–99)
Glucose-Capillary: 125 mg/dL — ABNORMAL HIGH (ref 70–99)
Glucose-Capillary: 89 mg/dL (ref 70–99)
Glucose-Capillary: 89 mg/dL (ref 70–99)
Glucose-Capillary: 92 mg/dL (ref 70–99)

## 2024-02-11 LAB — C-REACTIVE PROTEIN: CRP: 1.2 mg/dL — ABNORMAL HIGH (ref <1.0)

## 2024-02-11 LAB — BASIC METABOLIC PANEL WITH GFR
Anion gap: 8 (ref 5–15)
BUN: 6 mg/dL — ABNORMAL LOW (ref 8–23)
CO2: 21 mmol/L — ABNORMAL LOW (ref 22–32)
Calcium: 8.7 mg/dL — ABNORMAL LOW (ref 8.9–10.3)
Chloride: 110 mmol/L (ref 98–111)
Creatinine, Ser: 1.12 mg/dL — ABNORMAL HIGH (ref 0.44–1.00)
GFR, Estimated: 51 mL/min — ABNORMAL LOW (ref >60)
Glucose, Bld: 123 mg/dL — ABNORMAL HIGH (ref 70–99)
Potassium: 3.2 mmol/L — ABNORMAL LOW (ref 3.5–5.1)
Sodium: 139 mmol/L (ref 135–145)

## 2024-02-11 LAB — MAGNESIUM: Magnesium: 1.6 mg/dL — ABNORMAL LOW (ref 1.7–2.4)

## 2024-02-11 MED ORDER — MELATONIN 5 MG PO TABS
5.0000 mg | ORAL_TABLET | Freq: Every evening | ORAL | Status: AC | PRN
  Administered 2024-02-11 – 2024-02-12 (×2): 5 mg via ORAL
  Filled 2024-02-11 (×2): qty 1

## 2024-02-11 MED ORDER — POTASSIUM CHLORIDE CRYS ER 20 MEQ PO TBCR
30.0000 meq | EXTENDED_RELEASE_TABLET | Freq: Two times a day (BID) | ORAL | Status: AC
  Administered 2024-02-11 (×2): 30 meq via ORAL
  Filled 2024-02-11 (×2): qty 1

## 2024-02-11 MED ORDER — DIAZEPAM 2 MG PO TABS
2.0000 mg | ORAL_TABLET | Freq: Once | ORAL | Status: AC | PRN
Start: 2024-02-11 — End: 2024-02-11
  Administered 2024-02-11: 2 mg via ORAL
  Filled 2024-02-11: qty 1

## 2024-02-11 MED ORDER — GADOBUTROL 1 MMOL/ML IV SOLN
6.0000 mL | Freq: Once | INTRAVENOUS | Status: AC | PRN
  Administered 2024-02-11: 6 mL via INTRAVENOUS

## 2024-02-11 MED ORDER — MAGNESIUM SULFATE 2 GM/50ML IV SOLN
2.0000 g | Freq: Once | INTRAVENOUS | Status: AC
  Administered 2024-02-11: 2 g via INTRAVENOUS
  Filled 2024-02-11: qty 50

## 2024-02-11 NOTE — Hospital Course (Signed)
 Hospital course:  78 yo female with HTN, DM 2, hypothyroidism, CKD 3 AA was recent episode of pancreatitis s/p lap chole on 01/18/2024 presented with abdominal pain.  Workup was notable for acute diverticulitis of ascending colon with possible microperforation but no drainable abscess.  Patient was seen by general surgery who are recommending conservative management with antibiotics.   Subjective:  Patient states that she has had itching in her face and scalp and hands, thinks this is secondary to her eczema.  Notes that she uses mometasone and triamcinolone at home.  Hand tremors have resolved entirely.  Exam:  General: Patient lying in bed propping up her phone on her pillow talking comfortably to a friend in NAD Skin: No rash noted on upper chest back scalp or face Eyes: sclera anicteric, conjuctiva mild injection bilaterally CVS: S1-S2, regular  Respiratory:  decreased air entry bilaterally secondary to decreased inspiratory effort, rales at bases  GI: Abdomen is firm but not hard, there is increased tenderness to light palpation from previous, there is some voluntary and involuntary guarding,?  Rebound LE: Warm and well-perfused Neuro: A/O x 3, patient's hands are improved, less intentional tremor, motor is still 4+/5 bilateral leg with grip strength. Psych: patient is logical and coherent, judgement and insight appear normal, mood and affect appropriate to situation.  Assessment & Plan:   Acute diverticulitis of ascending colon with microperforation Continue cefepime  and Flagyl  day #6 Abdominal exam is concerning today, she has got increased tenderness with some voluntary and involuntary guarding Will order abdominal pelvis CT. General surgery notes abdominal exam may be secondary to chronic pancreatitis  Facial and scalp itching No rash noted suggestive of drug eruption Patient has history of eczema Will try hydrocortisone cream If rash develops, would consider antibiotics as  possible culprit  Pancreatitis with peripancreatic fluid collection S/p lap chole 3 weeks ago. Patient had less abdominal pain yesterday and hunger had returned Lipase had improved and was down to 66 If abdominal pelvic CT is not regulatory, would consider discharge home tomorrow.  Bilateral hand shaking Resolved with discontinuation of gabapentin  Per neurology, this is not uncommon with gabapentin   Hypokalemia Hypomagnesemia Resolved with repletion  HTN Patient is normotensive off of her usual antihypertensives  Hypothyroidism Continue Synthroid  TSH at goal  Thickened endometrium Patient has outpatient appointment with GYN oncology 01/31/2024 and is scheduled for hysteroscopy and D&C on 02/29/2024.     DVT prophylaxis: Lovenox  Code Status: Full Family Communication: None today Disposition: Home

## 2024-02-11 NOTE — Progress Notes (Signed)
 Physical Therapy Treatment Patient Details Name: Alexandria Durham MRN: [id number] DOB: 03-05-1946 Today's Date: 02/11/2024   History of Present Illness 78 y.o. female with recent hospitalization from 01/11/2024-01/21/2024 for acute biliary pancreatitis with acute cholecystitis status post laparoscopic cholecystectomy, status post D&C on 01/17/2024 for thickened endometrium during laparoscopic cholecystectomy presented with worsening abdominal pain.   CT showed  acute diverticulitis of the ascending colon with possible microperforation but no drainable abscess.  She was started on IV fluids and antibiotics. Pt  with medical history significant of hypertension, diabetes mellitus type 2, hyperlipidemia, hypothyroidism, GERD, CKD stage IIIa, diverticulosis, tobacco use    PT Comments  Pt is mobilizing well, she ambulated 180' with RW, no loss of balance. PT goals have been met, will sign off and have mobility team follow.     If plan is discharge home, recommend the following: Assistance with cooking/housework;Assist for transportation   Can travel by private vehicle        Equipment Recommendations  None recommended by PT    Recommendations for Other Services       Precautions / Restrictions Precautions Precautions: Fall Recall of Precautions/Restrictions: Intact Precaution/Restrictions Comments: pt denies falls in past 6 months Restrictions Weight Bearing Restrictions Per Provider Order: No     Mobility  Bed Mobility   Bed Mobility: Rolling, Sidelying to Sit Rolling: Used rails, Modified independent (Device/Increase time) Sidelying to sit: Used rails, Modified independent (Device/Increase time)       General bed mobility comments: min A to raise trunk    Transfers Overall transfer level: Modified independent Equipment used: Rolling walker (2 wheels) Transfers: Sit to/from Stand Sit to Stand: Modified independent (Device/Increase time)           General transfer comment:  cues for hand placement, used rail at toilet    Ambulation/Gait Ambulation/Gait assistance: Modified independent (Device/Increase time) Gait Distance (Feet): 180 Feet Assistive device: Rolling walker (2 wheels) Gait Pattern/deviations: Step-through pattern, Decreased stride length, Decreased stance time - left, Trunk flexed Gait velocity: WFL     General Gait Details: steady, no loss of balance   Stairs             Wheelchair Mobility     Tilt Bed    Modified Rankin (Stroke Patients Only)       Balance Overall balance assessment: Modified Independent                                          Communication Communication Communication: No apparent difficulties  Cognition Arousal: Alert Behavior During Therapy: WFL for tasks assessed/performed   PT - Cognitive impairments: No apparent impairments                         Following commands: Intact      Cueing Cueing Techniques: Verbal cues  Exercises      General Comments        Pertinent Vitals/Pain Pain Assessment Pain Assessment: 0-10 Pain Score: 2  Pain Location: L abdomen Pain Descriptors / Indicators: Guarding Pain Intervention(s): Limited activity within patient's tolerance, Monitored during session, Repositioned    Home Living                          Prior Function            PT Goals (current  goals can now be found in the care plan section) Acute Rehab PT Goals Patient Stated Goal: likes to go to [employer] and visit her friend PT Goal Formulation: With patient Time For Goal Achievement: 02/22/24 Potential to Achieve Goals: Good Progress towards PT goals: Goals met/education completed, patient discharged from PT    Frequency    Min 3X/week      PT Plan      Co-evaluation              AM-PAC PT 6 Clicks Mobility   Outcome Measure  Help needed turning from your back to your side while in a flat bed without using bedrails?:  None Help needed moving from lying on your back to sitting on the side of a flat bed without using bedrails?: None Help needed moving to and from a bed to a chair (including a wheelchair)?: None Help needed standing up from a chair using your arms (e.g., wheelchair or bedside chair)?: None Help needed to walk in hospital room?: None Help needed climbing 3-5 steps with a railing? : None 6 Click Score: 24    End of Session Equipment Utilized During Treatment: Gait belt Activity Tolerance: Patient tolerated treatment well Patient left: in bed;with call bell/phone within reach Nurse Communication: Mobility status PT Visit Diagnosis: Difficulty in walking, not elsewhere classified (R26.2)     Time: 1610-9604 PT Time Calculation (min) (ACUTE ONLY): 15 min  Charges:    $Gait Training: 8-22 mins PT General Charges $$ ACUTE PT VISIT: 1 Visit                     Daymon Evans PT 02/11/2024  Acute Rehabilitation Services  Office [phone]

## 2024-02-11 NOTE — Progress Notes (Signed)
 PROGRESS NOTE    Alexandria Durham  KVQ:[id number] DOB: 1946-07-09 DOA: 02/08/2024 PCP: Bayard Limbo, NP    Hospital course:  78 yo female with HTN, DM 2, hypothyroidism, CKD 3 AA was recent episode of pancreatitis s/p lap chole on 01/18/2024 presented with abdominal pain.  Workup was notable for acute diverticulitis of ascending colon with possible microperforation but no drainable abscess.  Patient was seen by general surgery who are recommending conservative management with antibiotics.   Subjective:  Patient is concerned about bilateral hand shaking, states I got nervous hands.  Notes that for the past 3 days she has been spilling things and dropping things because her hands feel weak and they are shaking anytime she tries to hold onto anything.  She also admits to pain and weakness in her upper arms although notes that she has rotator cuff problems and she thinks it is attributable to that.  She is unable to raise her hands above her head due to her rotator cuff weakness per her report.  This has not been worse over the past 3 days.  Patient does admit to some sensation that somebody else is in the room when she is just waking up but when she wakes up she is aware that there is nobody in the room.  Denies visual hallucinations.  Thinks she is having vivid dreams as she is waking up.  Exam:  General: Patient lying in bed propping up her phone on her pillow talking comfortably to a friend in NAD Eyes: sclera anicteric, conjuctiva mild injection bilaterally CVS: S1-S2, regular  Respiratory:  decreased air entry bilaterally secondary to decreased inspiratory effort, rales at bases  GI: NABS, soft, NT  LE: Warm and well-perfused Neuro: A/O x 3, patient does not have a resting tremor.  Patient does have coarse rotating tremor when picking up a cup or her phone.  Her grip strength bilaterally is equal and about 4/5.  Right shoulder flexion to 140 degrees, unwilling to do shoulder abduction  due to pain.  Left shoulder flexion and abduction are limited due to pain and likely some level of calcific tendinitis. Psych: patient is logical and coherent, judgement and insight appear normal, mood and affect appropriate to situation.  Assessment & Plan:   Acute diverticulitis of ascending colon with microperforation Continue cefepime  and Flagyl  Followed closely by general surgery Continue full liquid diet per their recommendations given some nausea today   Pancreatitis with peripancreatic fluid collection S/p lap chole 3 weeks ago. Admission lipase was 114, down to 73 the next day on 02/09/2024, imaging without any significant bile duct dilatation. Abdominal exam is without acute tenderness Continues to have intermittent nausea, on full liquid diet per general surgery Repeat lipase in a.m.  Bilateral hand shaking and possible weakness Discussed with neurology, will supplement magnesium  and potassium which could be contributory.  Will check TSH, B12 and folate.  Will also order ESR to rule out polymyalgia rheumatica which I do not think this is. Discontinue gabapentin  which is not uncommonly associated with bilateral hand shaking. Will get MRI cervical spine to rule out any form of cervical stenosis  Hypokalemia Hypomagnesemia Will replete and recheck  HTN Patient is normotensive off of her usual antihypertensives  Hypothyroidism Continue Synthroid , TSH to be checked tomorrow  Thickened endometrium Patient has outpatient appointment with GYN oncology 01/31/2024 and is scheduled for hysteroscopy and D&C on 02/29/2024.     DVT prophylaxis: Lovenox  Code Status: Full Family Communication: None today Disposition: Home  Diet Orders (From admission, onward)     Start     Ordered   02/10/24 0958  Diet full liquid Room service appropriate? Yes; Fluid consistency: Thin  Diet effective now       Question Answer Comment  Room service appropriate? Yes   Fluid consistency: Thin       02/10/24 0957            Objective: Vitals:   02/10/24 1316 02/10/24 2048 02/11/24 0431 02/11/24 1416  BP: 130/65 (!) 142/73 (!) 146/78 (!) 134/56  Pulse: 85 92 96 80  Resp: 18 18 20 14   Temp: 97.6 F (36.4 C) 98.7 F (37.1 C) 97.8 F (36.6 C) 99.5 F (37.5 C)  TempSrc: Oral Oral Oral Oral  SpO2: 99% 100% 99% 99%  Weight:      Height:        Intake/Output Summary (Last 24 hours) at 02/11/2024 1614 Last data filed at 02/11/2024 1400 Gross per 24 hour  Intake 2805.56 ml  Output 2800 ml  Net 5.56 ml   Filed Weights   02/08/24 1242  Weight: 67 kg    Scheduled Meds:  buPROPion   150 mg Oral QPM   enoxaparin  (LOVENOX ) injection  40 mg Subcutaneous Q24H   insulin  aspart  0-9 Units Subcutaneous Q4H   levothyroxine   75 mcg Oral Q0600   nicotine   14 mg Transdermal Daily   pantoprazole   40 mg Oral Daily   potassium chloride   30 mEq Oral BID   sodium chloride  flush  3 mL Intravenous Q12H   Continuous Infusions:  ceFEPime  (MAXIPIME ) IV 2 g (02/11/24 0911)   magnesium  sulfate bolus IVPB     metronidazole  500 mg (02/11/24 0427)    Nutritional status     Body mass index is 25.35 kg/m.  Data Reviewed:   CBC: Recent Labs  Lab 02/08/24 1305 02/09/24 0657 02/10/24 0524 02/11/24 0420  WBC 5.6 4.1 4.0 4.0  NEUTROABS 2.9 1.5* 2.5 1.7  HGB 14.2 11.4* 10.6* 9.7*  HCT 43.2 35.6* 34.0* 30.1*  MCV 91.1 96.2 96.9 93.8  PLT 235 205 196 200   Basic Metabolic Panel: Recent Labs  Lab 02/08/24 1305 02/09/24 0521 02/10/24 0524 02/11/24 0420  NA 135 138 138 139  K 3.9 3.8 3.5 3.2*  CL 98 107 110 110  CO2 22 22 19* 21*  GLUCOSE 116* 80 154* 123*  BUN 29* 23 13 6*  CREATININE 1.63* 1.37* 0.95 1.12*  CALCIUM  10.7* 9.2 9.2 8.7*  MG  --  1.5* 2.0 1.6*  PHOS  --  3.2  --   --    GFR: Estimated Creatinine Clearance: 40.2 mL/min (A) (by C-G formula based on SCr of 1.12 mg/dL (H)). Liver Function Tests: Recent Labs  Lab 02/08/24 1305 02/09/24 0521  02/10/24 0524  AST 19 16 13*  ALT 19 14 12   ALKPHOS 68 50 47  BILITOT 1.1 1.0 0.7  PROT 8.8* 6.5 6.1*  ALBUMIN 4.0 3.0* 2.9*   Recent Labs  Lab 02/08/24 1305 02/09/24 0521  LIPASE 114* 73*   No results for input(s): AMMONIA in the last 168 hours. Coagulation Profile: No results for input(s): INR, PROTIME in the last 168 hours. Cardiac Enzymes: No results for input(s): CKTOTAL, CKMB, CKMBINDEX, TROPONINI in the last 168 hours. BNP (last 3 results) No results for input(s): PROBNP in the last 8760 hours. HbA1C: No results for input(s): HGBA1C in the last 72 hours. CBG: Recent Labs  Lab 02/10/24 2319 02/11/24 0356 02/11/24 1478  02/11/24 1145 02/11/24 1608  GLUCAP 116* 125* 92 100* 109*   Lipid Profile: No results for input(s): CHOL, HDL, LDLCALC, TRIG, CHOLHDL, LDLDIRECT in the last 72 hours. Thyroid Function Tests: No results for input(s): TSH, T4TOTAL, FREET4, T3FREE, THYROIDAB in the last 72 hours. Anemia Panel: No results for input(s): VITAMINB12, FOLATE, FERRITIN, TIBC, IRON, RETICCTPCT in the last 72 hours. Sepsis Labs: No results for input(s): PROCALCITON, LATICACIDVEN in the last 168 hours.  Recent Results (from the past 240 hours)  Culture, blood (Routine X 2) w Reflex to ID Panel     Status: None (Preliminary result)   Collection Time: 02/08/24  9:07 PM   Specimen: BLOOD RIGHT ARM  Result Value Ref Range Status   Specimen Description   Final    BLOOD RIGHT ARM Performed at [facility], [address] [place], [place] [address]    Special Requests   Final    BOTTLES DRAWN AEROBIC AND ANAEROBIC Blood Culture results may not be optimal due to an inadequate volume of blood received in culture bottles Performed at [facility], [address], [place], [place] [address]    Culture   Final    NO GROWTH 3 DAYS Performed at [facility], [address] [place], [place]  [address]    Report Status PENDING  Incomplete  Culture, blood (Routine X 2) w Reflex to ID Panel     Status: None (Preliminary result)   Collection Time: 02/08/24  9:07 PM   Specimen: BLOOD RIGHT HAND  Result Value Ref Range Status   Specimen Description   Final    BLOOD RIGHT HAND Performed at [facility], [address], [place], [place] [address]    Special Requests   Final    BOTTLES DRAWN AEROBIC AND ANAEROBIC Blood Culture results may not be optimal due to an inadequate volume of blood received in culture bottles Performed at [facility], [address], [place], [place] [address]    Culture   Final    NO GROWTH 3 DAYS Performed at [facility], [address] [place], [place] [address]    Report Status PENDING  Incomplete         Radiology Studies: No results found.         LOS: 0 days   Time spent= 35 mins    Magdalene School, MD [facility]  If 7PM-7AM, please contact night-coverage  02/11/2024, 4:14 PM

## 2024-02-11 NOTE — Progress Notes (Addendum)
 Subjective: Says she doesn't feel well today.  Her hands are nervous and she keeps dropping things and spilling things because her hands and arms are so weak.  She says this is new over the last 3 days.  She denies neck pain.  She does admit to talking to things in the room that she knows aren't there.  She is eating some of the FLD, but this morning felt a bit nauseous.  Abdominal pain is mild and relatively unchanged.  ROS: See above, otherwise other systems negative  Objective: Vital signs in last 24 hours: Temp:  [97.6 F (36.4 C)-98.7 F (37.1 C)] 97.8 F (36.6 C) (06/13 0431) Pulse Rate:  [85-96] 96 (06/13 0431) Resp:  [18-20] 20 (06/13 0431) BP: (130-146)/(65-78) 146/78 (06/13 0431) SpO2:  [99 %-100 %] 99 % (06/13 0431) Last BM Date : 02/09/24  Intake/Output from previous day: 06/12 0701 - 06/13 0700 In: 3359.2 [P.O.:1800; I.V.:1158.1; IV Piggyback:401.1] Out: 1800 [Urine:1800] Intake/Output this shift: Total I/O In: -  Out: 500 [Urine:500]  PE: Gen: NAD, laying in bed Abd: soft, mild tenderness in epigastrium, minimal on right side, ND, +BS Neuro: grip and biceps with decreased strength. Good shoulder shrug, grimace, smile, cheek puffing, etc.  Grossly neurologically intact, mentation is appropriate.  Lab Results:  Recent Labs    02/10/24 0524 02/11/24 0420  WBC 4.0 4.0  HGB 10.6* 9.7*  HCT 34.0* 30.1*  PLT 196 200   BMET Recent Labs    02/10/24 0524 02/11/24 0420  NA 138 139  K 3.5 3.2*  CL 110 110  CO2 19* 21*  GLUCOSE 154* 123*  BUN 13 6*  CREATININE 0.95 1.12*  CALCIUM  9.2 8.7*   PT/INR No results for input(s): LABPROT, INR in the last 72 hours. CMP     Component Value Date/Time   NA 139 02/11/2024 0420   K 3.2 (L) 02/11/2024 0420   CL 110 02/11/2024 0420   CO2 21 (L) 02/11/2024 0420   GLUCOSE 123 (H) 02/11/2024 0420   BUN 6 (L) 02/11/2024 0420   CREATININE 1.12 (H) 02/11/2024 0420   CALCIUM  8.7 (L) 02/11/2024 0420    PROT 6.1 (L) 02/10/2024 0524   ALBUMIN 2.9 (L) 02/10/2024 0524   AST 13 (L) 02/10/2024 0524   ALT 12 02/10/2024 0524   ALKPHOS 47 02/10/2024 0524   BILITOT 0.7 02/10/2024 0524   GFRNONAA 51 (L) 02/11/2024 0420   GFRAA 51 (L) 03/08/2016 1618   Lipase     Component Value Date/Time   LIPASE 73 (H) 02/09/2024 0521       Studies/Results: No results found.   Anti-infectives: Anti-infectives (From admission, onward)    Start     Dose/Rate Route Frequency Ordered Stop   02/09/24 1000  ceFEPIme  (MAXIPIME ) 2 g in sodium chloride  0.9 % 100 mL IVPB        2 g 200 mL/hr over 30 Minutes Intravenous Daily 02/08/24 1751     02/09/24 0600  metroNIDAZOLE  (FLAGYL ) IVPB 500 mg        500 mg 100 mL/hr over 60 Minutes Intravenous 2 times daily 02/08/24 1751     02/08/24 1600  ceFEPIme  (MAXIPIME ) 2 g in sodium chloride  0.9 % 100 mL IVPB       Placed in And Linked Group   2 g 200 mL/hr over 30 Minutes Intravenous  Once 02/08/24 1556 02/08/24 1722   02/08/24 1600  metroNIDAZOLE  (FLAGYL ) IVPB 500 mg  Placed in And Linked Group   500 mg 100 mL/hr over 60 Minutes Intravenous  Once 02/08/24 1556 02/08/24 1835        Assessment/Plan Ascending colon diverticulitis with microperf Recent lap chole for presumed gallstone pancreatitis with persistent peripancreatic fluid collection  -doesn't feel as well today, but for reasons above and not related to her abdomen; however, she complains of a little bit of nausea this am.  Will keep her on FLD for today - May have some component of chronic pancreatitis, but unclear -mobilize, pulm toilet -labs reviewed, WBC normal -cont abx therapy for diverticulitis -d/w primary service the patient's concern regarding her nervous hands and weakness etc  FEN - FLD, K 3.2, replace to help with gut function VTE - Lovenox  ID - Maxipime /Flagyl   I reviewed nursing notes, hospitalist notes, last 24 h vitals and pain scores, last 48 h intake and output,  last 24 h labs and trends, and last 24 h imaging results.   LOS: 0 days    Leone Ralphs , [facility] Surgery 02/11/2024, 8:55 AM Please see Amion for pager number during day hours 7:00am-4:30pm or 7:00am -11:30am on weekends

## 2024-02-12 DIAGNOSIS — K5792 Diverticulitis of intestine, part unspecified, without perforation or abscess without bleeding: Secondary | ICD-10-CM | POA: Diagnosis not present

## 2024-02-12 LAB — BASIC METABOLIC PANEL WITH GFR
Anion gap: 7 (ref 5–15)
BUN: <5 mg/dL — ABNORMAL LOW (ref 8–23)
CO2: 22 mmol/L (ref 22–32)
Calcium: 9.4 mg/dL (ref 8.9–10.3)
Chloride: 109 mmol/L (ref 98–111)
Creatinine, Ser: 1.08 mg/dL — ABNORMAL HIGH (ref 0.44–1.00)
GFR, Estimated: 53 mL/min — ABNORMAL LOW (ref >60)
Glucose, Bld: 94 mg/dL (ref 70–99)
Potassium: 4.1 mmol/L (ref 3.5–5.1)
Sodium: 138 mmol/L (ref 135–145)

## 2024-02-12 LAB — GLUCOSE, CAPILLARY
Glucose-Capillary: 107 mg/dL — ABNORMAL HIGH (ref 70–99)
Glucose-Capillary: 111 mg/dL — ABNORMAL HIGH (ref 70–99)
Glucose-Capillary: 127 mg/dL — ABNORMAL HIGH (ref 70–99)
Glucose-Capillary: 158 mg/dL — ABNORMAL HIGH (ref 70–99)
Glucose-Capillary: 85 mg/dL (ref 70–99)
Glucose-Capillary: 87 mg/dL (ref 70–99)

## 2024-02-12 LAB — VITAMIN B12: Vitamin B-12: 385 pg/mL (ref 180–914)

## 2024-02-12 LAB — TSH: TSH: 1.345 u[IU]/mL (ref 0.350–4.500)

## 2024-02-12 LAB — LIPASE, BLOOD: Lipase: 66 U/L — ABNORMAL HIGH (ref 11–51)

## 2024-02-12 LAB — SEDIMENTATION RATE: Sed Rate: 44 mm/h — ABNORMAL HIGH (ref 0–22)

## 2024-02-12 LAB — HEMOGLOBIN AND HEMATOCRIT, BLOOD
HCT: 32.7 % — ABNORMAL LOW (ref 36.0–46.0)
Hemoglobin: 10.5 g/dL — ABNORMAL LOW (ref 12.0–15.0)

## 2024-02-12 MED ORDER — HYDROXYZINE HCL 25 MG PO TABS
25.0000 mg | ORAL_TABLET | Freq: Once | ORAL | Status: AC
  Administered 2024-02-12: 25 mg via ORAL
  Filled 2024-02-12: qty 1

## 2024-02-12 MED ORDER — DIPHENHYDRAMINE HCL 25 MG PO CAPS
25.0000 mg | ORAL_CAPSULE | Freq: Three times a day (TID) | ORAL | Status: DC | PRN
  Administered 2024-02-12 – 2024-02-15 (×5): 25 mg via ORAL
  Filled 2024-02-12 (×6): qty 1

## 2024-02-12 NOTE — Progress Notes (Signed)
 PROGRESS NOTE    Alexandria Durham  ZOX:[id number] DOB: July 04, 1946 DOA: 02/08/2024 PCP: Bayard Limbo, NP    Hospital course:  78 yo female with HTN, DM 2, hypothyroidism, CKD 3 AA was recent episode of pancreatitis s/p lap chole on 01/18/2024 presented with abdominal pain.  Workup was notable for acute diverticulitis of ascending colon with possible microperforation but no drainable abscess.  Patient was seen by general surgery who are recommending conservative management with antibiotics.   Subjective:  Patient states that her hands are much better, no longer shaking like they were before. States she is hungry and is looking forward to having some solid food, notes improved abdominal pain and decreased nausea.  Exam:  General: Patient lying in bed propping up her phone on her pillow talking comfortably to a friend in NAD Eyes: sclera anicteric, conjuctiva mild injection bilaterally CVS: S1-S2, regular  Respiratory:  decreased air entry bilaterally secondary to decreased inspiratory effort, rales at bases  GI: NABS, soft, NT  LE: Warm and well-perfused Neuro: A/O x 3, patient's hands are improved, less intentional tremor, motor is still 4+/5 bilateral leg with grip strength. Psych: patient is logical and coherent, judgement and insight appear normal, mood and affect appropriate to situation.  Assessment & Plan:   Acute diverticulitis of ascending colon with microperforation Continue cefepime  and Flagyl  day #5 Followed closely by general surgery Trial of soft diet today, if she tolerates would consider discharge tomorrow.  Pancreatitis with peripancreatic fluid collection S/p lap chole 3 weeks ago. Symptoms improved with less abdominal pain, hunger has returned Lipase is improved and down to 66 Abdominal exam is without acute tenderness If she tolerates diet, would consider discharge tomorrow  Bilateral hand shaking Resolved with discontinuation of gabapentin  Per neurology,  this is not uncommon with gabapentin   Hypokalemia Hypomagnesemia Resolved with repletion  HTN Patient is normotensive off of her usual antihypertensives  Hypothyroidism Continue Synthroid  TSH at goal  Thickened endometrium Patient has outpatient appointment with GYN oncology 01/31/2024 and is scheduled for hysteroscopy and D&C on 02/29/2024.     DVT prophylaxis: Lovenox  Code Status: Full Family Communication: None today Disposition: Home                 Diet Orders (From admission, onward)     Start     Ordered   02/12/24 1117  DIET SOFT Room service appropriate? Yes; Fluid consistency: Thin  Diet effective now       Question Answer Comment  Room service appropriate? Yes   Fluid consistency: Thin      02/12/24 1116            Objective: Vitals:   02/11/24 1416 02/11/24 1959 02/12/24 0629 02/12/24 1313  BP: (!) 134/56 (!) 143/74 (!) 155/78 (!) 154/71  Pulse: 80 85 77 77  Resp: 14 16 16    Temp: 99.5 F (37.5 C) 98.6 F (37 C) 97.9 F (36.6 C) 98 F (36.7 C)  TempSrc: Oral  Oral Oral  SpO2: 99% 100% 100% 97%  Weight:      Height:        Intake/Output Summary (Last 24 hours) at 02/12/2024 1817 Last data filed at 02/12/2024 1535 Gross per 24 hour  Intake 1311.9 ml  Output 900 ml  Net 411.9 ml   Filed Weights   02/08/24 1242  Weight: 67 kg    Scheduled Meds:  buPROPion   150 mg Oral QPM   enoxaparin  (LOVENOX ) injection  40 mg Subcutaneous Q24H   insulin   aspart  0-9 Units Subcutaneous Q4H   levothyroxine   75 mcg Oral Q0600   nicotine   14 mg Transdermal Daily   pantoprazole   40 mg Oral Daily   sodium chloride  flush  3 mL Intravenous Q12H   Continuous Infusions:  ceFEPime  (MAXIPIME ) IV Stopped (02/12/24 1749)   metronidazole  500 mg (02/12/24 1644)    Nutritional status     Body mass index is 25.35 kg/m.  Data Reviewed:   CBC: Recent Labs  Lab 02/08/24 1305 02/09/24 0657 02/10/24 0524 02/11/24 0420 02/12/24 1200  WBC 5.6  4.1 4.0 4.0  --   NEUTROABS 2.9 1.5* 2.5 1.7  --   HGB 14.2 11.4* 10.6* 9.7* 10.5*  HCT 43.2 35.6* 34.0* 30.1* 32.7*  MCV 91.1 96.2 96.9 93.8  --   PLT 235 205 196 200  --    Basic Metabolic Panel: Recent Labs  Lab 02/08/24 1305 02/09/24 0521 02/10/24 0524 02/11/24 0420 02/12/24 0454  NA 135 138 138 139 138  K 3.9 3.8 3.5 3.2* 4.1  CL 98 107 110 110 109  CO2 22 22 19* 21* 22  GLUCOSE 116* 80 154* 123* 94  BUN 29* 23 13 6* <5*  CREATININE 1.63* 1.37* 0.95 1.12* 1.08*  CALCIUM  10.7* 9.2 9.2 8.7* 9.4  MG  --  1.5* 2.0 1.6*  --   PHOS  --  3.2  --   --   --    GFR: Estimated Creatinine Clearance: 41.7 mL/min (A) (by C-G formula based on SCr of 1.08 mg/dL (H)). Liver Function Tests: Recent Labs  Lab 02/08/24 1305 02/09/24 0521 02/10/24 0524  AST 19 16 13*  ALT 19 14 12   ALKPHOS 68 50 47  BILITOT 1.1 1.0 0.7  PROT 8.8* 6.5 6.1*  ALBUMIN 4.0 3.0* 2.9*   Recent Labs  Lab 02/08/24 1305 02/09/24 0521 02/12/24 0454  LIPASE 114* 73* 66*   No results for input(s): AMMONIA in the last 168 hours. Coagulation Profile: No results for input(s): INR, PROTIME in the last 168 hours. Cardiac Enzymes: No results for input(s): CKTOTAL, CKMB, CKMBINDEX, TROPONINI in the last 168 hours. BNP (last 3 results) No results for input(s): PROBNP in the last 8760 hours. HbA1C: No results for input(s): HGBA1C in the last 72 hours. CBG: Recent Labs  Lab 02/11/24 2353 02/12/24 0342 02/12/24 0727 02/12/24 1134 02/12/24 1530  GLUCAP 89 87 85 107* 127*   Lipid Profile: No results for input(s): CHOL, HDL, LDLCALC, TRIG, CHOLHDL, LDLDIRECT in the last 72 hours. Thyroid Function Tests: Recent Labs    02/12/24 0454  TSH 1.345   Anemia Panel: Recent Labs    02/12/24 0454  VITAMINB12 385   Sepsis Labs: No results for input(s): PROCALCITON, LATICACIDVEN in the last 168 hours.  Recent Results (from the past 240 hours)  Culture, blood (Routine X  2) w Reflex to ID Panel     Status: None (Preliminary result)   Collection Time: 02/08/24  9:07 PM   Specimen: BLOOD RIGHT ARM  Result Value Ref Range Status   Specimen Description   Final    BLOOD RIGHT ARM Performed at [facility], [address], [place], [place] [address]    Special Requests   Final    BOTTLES DRAWN AEROBIC AND ANAEROBIC Blood Culture results may not be optimal due to an inadequate volume of blood received in culture bottles Performed at [facility], [address], [place], [place] [address]    Culture   Final  NO GROWTH 4 DAYS Performed at [facility], [address] [place], [place] [address]    Report Status PENDING  Incomplete  Culture, blood (Routine X 2) w Reflex to ID Panel     Status: None (Preliminary result)   Collection Time: 02/08/24  9:07 PM   Specimen: BLOOD RIGHT HAND  Result Value Ref Range Status   Specimen Description   Final    BLOOD RIGHT HAND Performed at [facility], [address] [place], [place] [address]    Special Requests   Final    BOTTLES DRAWN AEROBIC AND ANAEROBIC Blood Culture results may not be optimal due to an inadequate volume of blood received in culture bottles Performed at [facility], [address] [place], [place] [address]    Culture   Final    NO GROWTH 4 DAYS Performed at [facility], [address] [place], [place] [address]    Report Status PENDING  Incomplete         Radiology Studies: MR CERVICAL SPINE W WO CONTRAST Result Date: 02/11/2024 CLINICAL DATA:  Initial evaluation for sinal stenosis. Spinal stenosis. EXAM: MRI CERVICAL SPINE WITHOUT AND WITH CONTRAST TECHNIQUE: Multiplanar and multiecho pulse sequences of the cervical spine, to include the craniocervical junction and cervicothoracic junction, were obtained without and with intravenous contrast. CONTRAST:  6mL GADAVIST GADOBUTROL 1 MMOL/ML IV SOLN COMPARISON:  Prior study  from 08/25/2017. FINDINGS: Alignment: Straightening with mild reversal of the normal cervical lordosis. No significant listhesis. Vertebrae: Vertebral body height maintained without acute or chronic fracture. Bone marrow signal intensity within normal limits. No worrisome osseous lesions. Scattered degenerative reactive endplate changes present about the C3-4 through C6-7 interspaces. No other abnormal marrow edema or enhancement. Cord: Normal signal and morphology.  No abnormal enhancement. Posterior Fossa, vertebral arteries, paraspinal tissues: Patchy signal abnormality within the visualized pons, most characteristic of chronic microvascular ischemic disease. Craniocervical junction within normal limits. Paraspinous soft tissues normal. Normal flow voids seen within the vertebral arteries bilaterally. Disc levels: C2-C3: Minimal disc bulge. Mild right greater than left facet hypertrophy. No spinal stenosis. Foramina remain patent. C3-C4: Degenerative intervertebral disc space narrowing with diffuse disc osteophyte complex, asymmetric to the left. Posterior component flattens and effaces the ventral thecal sac. Mild cord flattening without cord signal changes. Superimposed left-sided facet degeneration with bony ankylosis. Moderate spinal stenosis. Severe left with mild-to-moderate right C4 foraminal stenosis. C4-C5: Diffuse disc bulge with bilateral uncovertebral spurring. Right greater than left facet hypertrophy. Resultant mild to moderate spinal stenosis. Severe bilateral C5 foraminal stenosis. C5-C6: Degenerative intervertebral disc space narrowing with diffuse disc osteophyte complex. Flattening and partial effacement of the ventral thecal sac. Superimposed mild facet and ligament flavum hypertrophy. Moderate spinal stenosis with mild cord flattening but no cord signal changes. Severe left with moderate right C6 foraminal narrowing. C6-C7: Degenerative disc space narrowing with circumferential disc  osteophyte complex. Flattening and partial effacement of the ventral thecal sac. Mild to moderate spinal stenosis. Severe bilateral C7 foraminal narrowing. C7-T1: Minimal disc bulge. Right greater than left facet hypertrophy. No spinal stenosis. Mild left C8 foraminal narrowing. Right neural foramina remains patent. IMPRESSION: 1. Multilevel cervical spondylosis with resultant moderate diffuse spinal stenosis at C3-4 through C6-7. Mild cord flattening at these levels but with no cord signal changes to suggest myelopathy. 2. Multifactorial degenerative changes with resultant multilevel foraminal narrowing as above. Notable findings include severe left C4 foraminal stenosis, severe bilateral C5 and C7 foraminal narrowing,  with severe left and moderate right C6 foraminal stenosis. Electronically Signed   By: Virgia Griffins M.D.   On: 02/11/2024 20:53           LOS: 0 days   Time spent= 35 mins    Magdalene School, MD [facility]  If 7PM-7AM, please contact night-coverage  02/12/2024, 6:17 PM

## 2024-02-12 NOTE — Plan of Care (Signed)
  Problem: Education: Goal: Knowledge of General Education information will improve Description: Including pain rating scale, medication(s)/side effects and non-pharmacologic comfort measures Outcome: Progressing   Problem: Clinical Measurements: Goal: Ability to maintain clinical measurements within normal limits will improve Outcome: Progressing Goal: Will remain free from infection Outcome: Progressing Goal: Diagnostic test results will improve Outcome: Progressing Goal: Respiratory complications will improve Outcome: Progressing Goal: Cardiovascular complication will be avoided Outcome: Progressing   Problem: Pain Managment: Goal: General experience of comfort will improve and/or be controlled Outcome: Progressing   Problem: Safety: Goal: Ability to remain free from injury will improve Outcome: Progressing

## 2024-02-12 NOTE — Progress Notes (Signed)
 -      Subjective: Complains of upper abdominal pain.  Hasn't had a BM but has had flatus.    Objective: Vital signs in last 24 hours: Temp:  [97.9 F (36.6 C)-99.5 F (37.5 C)] 97.9 F (36.6 C) (06/14 0629) Pulse Rate:  [77-85] 77 (06/14 0629) Resp:  [14-16] 16 (06/14 0629) BP: (134-155)/(56-78) 155/78 (06/14 0629) SpO2:  [99 %-100 %] 100 % (06/14 0629) Last BM Date : 02/09/24  Intake/Output from previous day: 06/13 0701 - 06/14 0700 In: 2089 [P.O.:1790; IV Piggyback:299] Out: 2300 [Urine:2300] Intake/Output this shift: Total I/O In: 243 [P.O.:240; I.V.:3] Out: -   PE: Gen: NAD, laying in bed Abd: soft, mild tenderness in epigastrium. Mild bloating upper abdomen.  Grossly neurologically intact, mentation is appropriate.  Lab Results:  Recent Labs    02/10/24 0524 02/11/24 0420  WBC 4.0 4.0  HGB 10.6* 9.7*  HCT 34.0* 30.1*  PLT 196 200   BMET Recent Labs    02/11/24 0420 02/12/24 0454  NA 139 138  K 3.2* 4.1  CL 110 109  CO2 21* 22  GLUCOSE 123* 94  BUN 6* <5*  CREATININE 1.12* 1.08*  CALCIUM  8.7* 9.4   PT/INR No results for input(s): LABPROT, INR in the last 72 hours. CMP     Component Value Date/Time   NA 138 02/12/2024 0454   K 4.1 02/12/2024 0454   CL 109 02/12/2024 0454   CO2 22 02/12/2024 0454   GLUCOSE 94 02/12/2024 0454   BUN <5 (L) 02/12/2024 0454   CREATININE 1.08 (H) 02/12/2024 0454   CALCIUM  9.4 02/12/2024 0454   PROT 6.1 (L) 02/10/2024 0524   ALBUMIN 2.9 (L) 02/10/2024 0524   AST 13 (L) 02/10/2024 0524   ALT 12 02/10/2024 0524   ALKPHOS 47 02/10/2024 0524   BILITOT 0.7 02/10/2024 0524   GFRNONAA 53 (L) 02/12/2024 0454   GFRAA 51 (L) 03/08/2016 1618   Lipase     Component Value Date/Time   LIPASE 73 (H) 02/09/2024 0521       Studies/Results: MR CERVICAL SPINE W WO CONTRAST Result Date: 02/11/2024 CLINICAL DATA:  Initial evaluation for sinal stenosis. Spinal stenosis. EXAM: MRI CERVICAL SPINE WITHOUT AND WITH  CONTRAST TECHNIQUE: Multiplanar and multiecho pulse sequences of the cervical spine, to include the craniocervical junction and cervicothoracic junction, were obtained without and with intravenous contrast. CONTRAST:  6mL GADAVIST GADOBUTROL 1 MMOL/ML IV SOLN COMPARISON:  Prior study from 08/25/2017. FINDINGS: Alignment: Straightening with mild reversal of the normal cervical lordosis. No significant listhesis. Vertebrae: Vertebral body height maintained without acute or chronic fracture. Bone marrow signal intensity within normal limits. No worrisome osseous lesions. Scattered degenerative reactive endplate changes present about the C3-4 through C6-7 interspaces. No other abnormal marrow edema or enhancement. Cord: Normal signal and morphology.  No abnormal enhancement. Posterior Fossa, vertebral arteries, paraspinal tissues: Patchy signal abnormality within the visualized pons, most characteristic of chronic microvascular ischemic disease. Craniocervical junction within normal limits. Paraspinous soft tissues normal. Normal flow voids seen within the vertebral arteries bilaterally. Disc levels: C2-C3: Minimal disc bulge. Mild right greater than left facet hypertrophy. No spinal stenosis. Foramina remain patent. C3-C4: Degenerative intervertebral disc space narrowing with diffuse disc osteophyte complex, asymmetric to the left. Posterior component flattens and effaces the ventral thecal sac. Mild cord flattening without cord signal changes. Superimposed left-sided facet degeneration with bony ankylosis. Moderate spinal stenosis. Severe left with mild-to-moderate right C4 foraminal stenosis. C4-C5: Diffuse disc bulge with bilateral uncovertebral spurring. Right  greater than left facet hypertrophy. Resultant mild to moderate spinal stenosis. Severe bilateral C5 foraminal stenosis. C5-C6: Degenerative intervertebral disc space narrowing with diffuse disc osteophyte complex. Flattening and partial effacement of the  ventral thecal sac. Superimposed mild facet and ligament flavum hypertrophy. Moderate spinal stenosis with mild cord flattening but no cord signal changes. Severe left with moderate right C6 foraminal narrowing. C6-C7: Degenerative disc space narrowing with circumferential disc osteophyte complex. Flattening and partial effacement of the ventral thecal sac. Mild to moderate spinal stenosis. Severe bilateral C7 foraminal narrowing. C7-T1: Minimal disc bulge. Right greater than left facet hypertrophy. No spinal stenosis. Mild left C8 foraminal narrowing. Right neural foramina remains patent. IMPRESSION: 1. Multilevel cervical spondylosis with resultant moderate diffuse spinal stenosis at C3-4 through C6-7. Mild cord flattening at these levels but with no cord signal changes to suggest myelopathy. 2. Multifactorial degenerative changes with resultant multilevel foraminal narrowing as above. Notable findings include severe left C4 foraminal stenosis, severe bilateral C5 and C7 foraminal narrowing, with severe left and moderate right C6 foraminal stenosis. Electronically Signed   By: Virgia Griffins M.D.   On: 02/11/2024 20:53     Anti-infectives: Anti-infectives (From admission, onward)    Start     Dose/Rate Route Frequency Ordered Stop   02/09/24 1000  ceFEPIme  (MAXIPIME ) 2 g in sodium chloride  0.9 % 100 mL IVPB        2 g 200 mL/hr over 30 Minutes Intravenous Daily 02/08/24 1751     02/09/24 0600  metroNIDAZOLE  (FLAGYL ) IVPB 500 mg        500 mg 100 mL/hr over 60 Minutes Intravenous 2 times daily 02/08/24 1751     02/08/24 1600  ceFEPIme  (MAXIPIME ) 2 g in sodium chloride  0.9 % 100 mL IVPB       Placed in And Linked Group   2 g 200 mL/hr over 30 Minutes Intravenous  Once 02/08/24 1556 02/08/24 1722   02/08/24 1600  metroNIDAZOLE  (FLAGYL ) IVPB 500 mg       Placed in And Linked Group   500 mg 100 mL/hr over 60 Minutes Intravenous  Once 02/08/24 1556 02/08/24 1835         Assessment/Plan Ascending colon diverticulitis with microperf Recent lap chole for presumed gallstone pancreatitis with persistent peripancreatic fluid collection  Pt desires soft diet. Advanced.   - May have some component of chronic pancreatitis, but unclear -mobilize, pulm toilet -labs reviewed, WBC normal -cont abx therapy for diverticulitis  FEN - K normalized.   VTE - Lovenox  ID - Maxipime /Flagyl   I reviewed nursing notes, hospitalist notes, last 24 h vitals and pain scores, last 48 h intake and output, last 24 h labs and trends, and last 24 h imaging results.   LOS: 0 days    Lockie Rima , MD [facility] 02/12/2024, 11:36 AM Please see Amion for pager number during day hours 7:00am-4:30pm or 7:00am -11:30am on weekends

## 2024-02-13 ENCOUNTER — Observation Stay (HOSPITAL_COMMUNITY)

## 2024-02-13 DIAGNOSIS — K5792 Diverticulitis of intestine, part unspecified, without perforation or abscess without bleeding: Secondary | ICD-10-CM | POA: Diagnosis not present

## 2024-02-13 LAB — GLUCOSE, CAPILLARY
Glucose-Capillary: 101 mg/dL — ABNORMAL HIGH (ref 70–99)
Glucose-Capillary: 105 mg/dL — ABNORMAL HIGH (ref 70–99)
Glucose-Capillary: 116 mg/dL — ABNORMAL HIGH (ref 70–99)
Glucose-Capillary: 138 mg/dL — ABNORMAL HIGH (ref 70–99)
Glucose-Capillary: 70 mg/dL (ref 70–99)
Glucose-Capillary: 92 mg/dL (ref 70–99)
Glucose-Capillary: 98 mg/dL (ref 70–99)

## 2024-02-13 LAB — BASIC METABOLIC PANEL WITH GFR
Anion gap: 8 (ref 5–15)
BUN: 6 mg/dL — ABNORMAL LOW (ref 8–23)
CO2: 23 mmol/L (ref 22–32)
Calcium: 9.5 mg/dL (ref 8.9–10.3)
Chloride: 107 mmol/L (ref 98–111)
Creatinine, Ser: 1.06 mg/dL — ABNORMAL HIGH (ref 0.44–1.00)
GFR, Estimated: 54 mL/min — ABNORMAL LOW (ref >60)
Glucose, Bld: 89 mg/dL (ref 70–99)
Potassium: 3.7 mmol/L (ref 3.5–5.1)
Sodium: 138 mmol/L (ref 135–145)

## 2024-02-13 LAB — CULTURE, BLOOD (ROUTINE X 2)
Culture: NO GROWTH
Culture: NO GROWTH

## 2024-02-13 LAB — MAGNESIUM: Magnesium: 1.6 mg/dL — ABNORMAL LOW (ref 1.7–2.4)

## 2024-02-13 MED ORDER — IOHEXOL 9 MG/ML PO SOLN
500.0000 mL | ORAL | Status: AC
  Administered 2024-02-13 (×2): 500 mL via ORAL

## 2024-02-13 MED ORDER — HYDROCORTISONE 0.5 % EX CREA
TOPICAL_CREAM | Freq: Three times a day (TID) | CUTANEOUS | Status: DC | PRN
  Administered 2024-02-13: 1 via TOPICAL
  Filled 2024-02-13: qty 28.35

## 2024-02-13 MED ORDER — IOHEXOL 300 MG/ML  SOLN
100.0000 mL | Freq: Once | INTRAMUSCULAR | Status: AC | PRN
  Administered 2024-02-13: 100 mL via INTRAVENOUS

## 2024-02-13 NOTE — Progress Notes (Signed)
      Chief Complaint/Subjective: Pain continues to improve. She had trouble swallowing oatmeal this morning  Objective: Vital signs in last 24 hours: Temp:  [98 F (36.7 C)-99.2 F (37.3 C)] 99.2 F (37.3 C) (06/15 0527) Pulse Rate:  [77-90] 81 (06/15 0527) Resp:  [18] 18 (06/15 0527) BP: (133-154)/(62-71) 133/62 (06/15 0527) SpO2:  [97 %-100 %] 97 % (06/15 0527) Last BM Date : 02/12/24 Intake/Output from previous day: 06/14 0701 - 06/15 0700 In: 1044.5 [P.O.:720; I.V.:3; IV Piggyback:321.5] Out: 1550 [Urine:1550]  PE: Gen: NAD Resp: nonlabored Card: RRR Abd: soft, NT, ND  Lab Results:  Recent Labs    02/11/24 0420 02/12/24 1200  WBC 4.0  --   HGB 9.7* 10.5*  HCT 30.1* 32.7*  PLT 200  --    Recent Labs    02/12/24 0454 02/13/24 0448  NA 138 138  K 4.1 3.7  CL 109 107  CO2 22 23  GLUCOSE 94 89  BUN <5* 6*  CREATININE 1.08* 1.06*  CALCIUM  9.4 9.5   No results for input(s): LABPROT, INR in the last 72 hours.    Component Value Date/Time   NA 138 02/13/2024 0448   K 3.7 02/13/2024 0448   CL 107 02/13/2024 0448   CO2 23 02/13/2024 0448   GLUCOSE 89 02/13/2024 0448   BUN 6 (L) 02/13/2024 0448   CREATININE 1.06 (H) 02/13/2024 0448   CALCIUM  9.5 02/13/2024 0448   PROT 6.1 (L) 02/10/2024 0524   ALBUMIN 2.9 (L) 02/10/2024 0524   AST 13 (L) 02/10/2024 0524   ALT 12 02/10/2024 0524   ALKPHOS 47 02/10/2024 0524   BILITOT 0.7 02/10/2024 0524   GFRNONAA 54 (L) 02/13/2024 0448   GFRAA 51 (L) 03/08/2016 1618    Assessment/Plan Ascending colon diverticulitis with microperf Recent lap chole for presumed gallstone pancreatitis with persistent peripancreatic fluid collection  Pt desires soft diet. Advanced.   - May have some component of chronic pancreatitis, but unclear -mobilize, pulm toilet -labs reviewed, WBC normal -cont abx therapy for diverticulitis -from GI standpoint can discharge and transition to oral antibiotics for 3 days to complete 7 day  course   FEN - K normalized.   VTE - Lovenox  ID - Maxipime /Flagyl    LOS: 0 days   I reviewed last 24 h vitals and pain scores, last 48 h intake and output, last 24 h labs and trends, and last 24 h imaging results.  This care required straight-forward level of medical decision making.   Alphonso Aschoff [facility] 02/13/2024, 9:41 AM Please see Amion for pager number during day hours 7:00am-4:30pm or 7:00am -11:30am on weekends

## 2024-02-13 NOTE — Plan of Care (Signed)
   Problem: Education: Goal: Knowledge of General Education information will improve Description Including pain rating scale, medication(s)/side effects and non-pharmacologic comfort measures Outcome: Progressing   Problem: Health Behavior/Discharge Planning: Goal: Ability to manage health-related needs will improve Outcome: Progressing

## 2024-02-13 NOTE — Progress Notes (Signed)
 PROGRESS NOTE    Alexandria Durham  RUE:[id number] DOB: 07/14/1946 DOA: 02/08/2024 PCP: Bayard Limbo, NP    Hospital course:  78 yo female with HTN, DM 2, hypothyroidism, CKD 3 AA was recent episode of pancreatitis s/p lap chole on 01/18/2024 presented with abdominal pain.  Workup was notable for acute diverticulitis of ascending colon with possible microperforation but no drainable abscess.  Patient was seen by general surgery who are recommending conservative management with antibiotics.   Subjective:  Patient states that she has had itching in her face and scalp and hands, thinks this is secondary to her eczema.  Notes that she uses mometasone and triamcinolone at home.  Hand tremors have resolved entirely.  Exam:  General: Patient lying in bed propping up her phone on her pillow talking comfortably to a friend in NAD Skin: No rash noted on upper chest back scalp or face Eyes: sclera anicteric, conjuctiva mild injection bilaterally CVS: S1-S2, regular  Respiratory:  decreased air entry bilaterally secondary to decreased inspiratory effort, rales at bases  GI: Abdomen is firm but not hard, there is increased tenderness to light palpation from previous, there is some voluntary and involuntary guarding,?  Rebound LE: Warm and well-perfused Neuro: A/O x 3, patient's hands are improved, less intentional tremor, motor is still 4+/5 bilateral leg with grip strength. Psych: patient is logical and coherent, judgement and insight appear normal, mood and affect appropriate to situation.  Assessment & Plan:   Acute diverticulitis of ascending colon with microperforation Continue cefepime  and Flagyl  day #6 Abdominal exam is concerning today, she has got increased tenderness with some voluntary and involuntary guarding Will order abdominal pelvis CT. General surgery notes abdominal exam may be secondary to chronic pancreatitis  Facial and scalp itching No rash noted suggestive of drug  eruption Patient has history of eczema Will try hydrocortisone cream If rash develops, would consider antibiotics as possible culprit  Pancreatitis with peripancreatic fluid collection S/p lap chole 3 weeks ago. Patient had less abdominal pain yesterday and hunger had returned Lipase had improved and was down to 66 If abdominal pelvic CT is not regulatory, would consider discharge home tomorrow.  Bilateral hand shaking Resolved with discontinuation of gabapentin  Per neurology, this is not uncommon with gabapentin   Hypokalemia Hypomagnesemia Resolved with repletion  HTN Patient is normotensive off of her usual antihypertensives  Hypothyroidism Continue Synthroid  TSH at goal  Thickened endometrium Patient has outpatient appointment with GYN oncology 01/31/2024 and is scheduled for hysteroscopy and D&C on 02/29/2024.     DVT prophylaxis: Lovenox  Code Status: Full Family Communication: None today Disposition: Home                 Diet Orders (From admission, onward)     Start     Ordered   02/12/24 1117  DIET SOFT Room service appropriate? Yes; Fluid consistency: Thin  Diet effective now       Question Answer Comment  Room service appropriate? Yes   Fluid consistency: Thin      02/12/24 1116            Objective: Vitals:   02/13/24 0527 02/13/24 1423 02/13/24 1828 02/13/24 1830  BP: 133/62 (!) 158/80 (!) 134/100 (!) 167/74  Pulse: 81 82 86 86  Resp: 18 18 16    Temp: 99.2 F (37.3 C) 98.3 F (36.8 C) 98.1 F (36.7 C)   TempSrc:  Oral Oral   SpO2: 97% 98% 98% 99%  Weight:      Height:  Intake/Output Summary (Last 24 hours) at 02/13/2024 1846 Last data filed at 02/13/2024 1748 Gross per 24 hour  Intake 979.93 ml  Output 1550 ml  Net -570.07 ml   Filed Weights   02/08/24 1242  Weight: 67 kg    Scheduled Meds:  buPROPion   150 mg Oral QPM   enoxaparin  (LOVENOX ) injection  40 mg Subcutaneous Q24H   insulin  aspart  0-9 Units  Subcutaneous Q4H   levothyroxine   75 mcg Oral Q0600   nicotine   14 mg Transdermal Daily   pantoprazole   40 mg Oral Daily   sodium chloride  flush  3 mL Intravenous Q12H   Continuous Infusions:  ceFEPime  (MAXIPIME ) IV 2 g (02/13/24 1000)   metronidazole  500 mg (02/13/24 1756)    Nutritional status     Body mass index is 25.35 kg/m.  Data Reviewed:   CBC: Recent Labs  Lab 02/08/24 1305 02/09/24 0657 02/10/24 0524 02/11/24 0420 02/12/24 1200  WBC 5.6 4.1 4.0 4.0  --   NEUTROABS 2.9 1.5* 2.5 1.7  --   HGB 14.2 11.4* 10.6* 9.7* 10.5*  HCT 43.2 35.6* 34.0* 30.1* 32.7*  MCV 91.1 96.2 96.9 93.8  --   PLT 235 205 196 200  --    Basic Metabolic Panel: Recent Labs  Lab 02/09/24 0521 02/10/24 0524 02/11/24 0420 02/12/24 0454 02/13/24 0448  NA 138 138 139 138 138  K 3.8 3.5 3.2* 4.1 3.7  CL 107 110 110 109 107  CO2 22 19* 21* 22 23  GLUCOSE 80 154* 123* 94 89  BUN 23 13 6* <5* 6*  CREATININE 1.37* 0.95 1.12* 1.08* 1.06*  CALCIUM  9.2 9.2 8.7* 9.4 9.5  MG 1.5* 2.0 1.6*  --  1.6*  PHOS 3.2  --   --   --   --    GFR: Estimated Creatinine Clearance: 42.5 mL/min (A) (by C-G formula based on SCr of 1.06 mg/dL (H)). Liver Function Tests: Recent Labs  Lab 02/08/24 1305 02/09/24 0521 02/10/24 0524  AST 19 16 13*  ALT 19 14 12   ALKPHOS 68 50 47  BILITOT 1.1 1.0 0.7  PROT 8.8* 6.5 6.1*  ALBUMIN 4.0 3.0* 2.9*   Recent Labs  Lab 02/08/24 1305 02/09/24 0521 02/12/24 0454  LIPASE 114* 73* 66*   No results for input(s): AMMONIA in the last 168 hours. Coagulation Profile: No results for input(s): INR, PROTIME in the last 168 hours. Cardiac Enzymes: No results for input(s): CKTOTAL, CKMB, CKMBINDEX, TROPONINI in the last 168 hours. BNP (last 3 results) No results for input(s): PROBNP in the last 8760 hours. HbA1C: No results for input(s): HGBA1C in the last 72 hours. CBG: Recent Labs  Lab 02/13/24 0447 02/13/24 0548 02/13/24 0748  02/13/24 1253 02/13/24 1602  GLUCAP 70 138* 92 101* 98   Lipid Profile: No results for input(s): CHOL, HDL, LDLCALC, TRIG, CHOLHDL, LDLDIRECT in the last 72 hours. Thyroid Function Tests: Recent Labs    02/12/24 0454  TSH 1.345   Anemia Panel: Recent Labs    02/12/24 0454  VITAMINB12 385   Sepsis Labs: No results for input(s): PROCALCITON, LATICACIDVEN in the last 168 hours.  Recent Results (from the past 240 hours)  Culture, blood (Routine X 2) w Reflex to ID Panel     Status: None   Collection Time: 02/08/24  9:07 PM   Specimen: BLOOD RIGHT ARM  Result Value Ref Range Status   Specimen Description   Final    BLOOD RIGHT ARM Performed at [facility]  [facility], [address] [place], [place] [address]    Special Requests   Final    BOTTLES DRAWN AEROBIC AND ANAEROBIC Blood Culture results may not be optimal due to an inadequate volume of blood received in culture bottles Performed at [facility], [address], [place], [place] [address]    Culture   Final    NO GROWTH 5 DAYS Performed at [facility], [address], [place], [place] [address]    Report Status 02/13/2024 FINAL  Final  Culture, blood (Routine X 2) w Reflex to ID Panel     Status: None   Collection Time: 02/08/24  9:07 PM   Specimen: BLOOD RIGHT HAND  Result Value Ref Range Status   Specimen Description   Final    BLOOD RIGHT HAND Performed at [facility], [address] [place], [place] [address]    Special Requests   Final    BOTTLES DRAWN AEROBIC AND ANAEROBIC Blood Culture results may not be optimal due to an inadequate volume of blood received in culture bottles Performed at [facility], [address], [place], [place] [address]    Culture   Final    NO GROWTH 5 DAYS Performed at [facility], [address] [place], [place] [address]    Report Status 02/13/2024 FINAL  Final         Radiology Studies: MR  CERVICAL SPINE W WO CONTRAST Result Date: 02/11/2024 CLINICAL DATA:  Initial evaluation for sinal stenosis. Spinal stenosis. EXAM: MRI CERVICAL SPINE WITHOUT AND WITH CONTRAST TECHNIQUE: Multiplanar and multiecho pulse sequences of the cervical spine, to include the craniocervical junction and cervicothoracic junction, were obtained without and with intravenous contrast. CONTRAST:  6mL GADAVIST GADOBUTROL 1 MMOL/ML IV SOLN COMPARISON:  Prior study from 08/25/2017. FINDINGS: Alignment: Straightening with mild reversal of the normal cervical lordosis. No significant listhesis. Vertebrae: Vertebral body height maintained without acute or chronic fracture. Bone marrow signal intensity within normal limits. No worrisome osseous lesions. Scattered degenerative reactive endplate changes present about the C3-4 through C6-7 interspaces. No other abnormal marrow edema or enhancement. Cord: Normal signal and morphology.  No abnormal enhancement. Posterior Fossa, vertebral arteries, paraspinal tissues: Patchy signal abnormality within the visualized pons, most characteristic of chronic microvascular ischemic disease. Craniocervical junction within normal limits. Paraspinous soft tissues normal. Normal flow voids seen within the vertebral arteries bilaterally. Disc levels: C2-C3: Minimal disc bulge. Mild right greater than left facet hypertrophy. No spinal stenosis. Foramina remain patent. C3-C4: Degenerative intervertebral disc space narrowing with diffuse disc osteophyte complex, asymmetric to the left. Posterior component flattens and effaces the ventral thecal sac. Mild cord flattening without cord signal changes. Superimposed left-sided facet degeneration with bony ankylosis. Moderate spinal stenosis. Severe left with mild-to-moderate right C4 foraminal stenosis. C4-C5: Diffuse disc bulge with bilateral uncovertebral spurring. Right greater than left facet hypertrophy. Resultant mild to moderate spinal stenosis. Severe  bilateral C5 foraminal stenosis. C5-C6: Degenerative intervertebral disc space narrowing with diffuse disc osteophyte complex. Flattening and partial effacement of the ventral thecal sac. Superimposed mild facet and ligament flavum hypertrophy. Moderate spinal stenosis with mild cord flattening but no cord signal changes. Severe left with moderate right C6 foraminal narrowing. C6-C7: Degenerative disc space narrowing with circumferential disc osteophyte complex. Flattening and partial effacement of the ventral thecal sac. Mild to moderate spinal stenosis. Severe bilateral C7 foraminal narrowing. C7-T1: Minimal disc bulge. Right greater than left facet hypertrophy. No spinal  stenosis. Mild left C8 foraminal narrowing. Right neural foramina remains patent. IMPRESSION: 1. Multilevel cervical spondylosis with resultant moderate diffuse spinal stenosis at C3-4 through C6-7. Mild cord flattening at these levels but with no cord signal changes to suggest myelopathy. 2. Multifactorial degenerative changes with resultant multilevel foraminal narrowing as above. Notable findings include severe left C4 foraminal stenosis, severe bilateral C5 and C7 foraminal narrowing, with severe left and moderate right C6 foraminal stenosis. Electronically Signed   By: Virgia Griffins M.D.   On: 02/11/2024 20:53           LOS: 0 days   Time spent= 35 mins    Magdalene School, MD [facility]  If 7PM-7AM, please contact night-coverage  02/13/2024, 6:46 PM

## 2024-02-14 DIAGNOSIS — K5792 Diverticulitis of intestine, part unspecified, without perforation or abscess without bleeding: Secondary | ICD-10-CM | POA: Diagnosis not present

## 2024-02-14 LAB — LIPASE, BLOOD: Lipase: 66 U/L — ABNORMAL HIGH (ref 11–51)

## 2024-02-14 LAB — COMPREHENSIVE METABOLIC PANEL WITH GFR
ALT: 15 U/L (ref 0–44)
AST: 28 U/L (ref 15–41)
Albumin: 2.9 g/dL — ABNORMAL LOW (ref 3.5–5.0)
Alkaline Phosphatase: 46 U/L (ref 38–126)
Anion gap: 11 (ref 5–15)
BUN: <5 mg/dL — ABNORMAL LOW (ref 8–23)
CO2: 22 mmol/L (ref 22–32)
Calcium: 9.7 mg/dL (ref 8.9–10.3)
Chloride: 104 mmol/L (ref 98–111)
Creatinine, Ser: 1.04 mg/dL — ABNORMAL HIGH (ref 0.44–1.00)
GFR, Estimated: 56 mL/min — ABNORMAL LOW (ref >60)
Glucose, Bld: 88 mg/dL (ref 70–99)
Potassium: 3.8 mmol/L (ref 3.5–5.1)
Sodium: 137 mmol/L (ref 135–145)
Total Bilirubin: 0.4 mg/dL (ref 0.0–1.2)
Total Protein: 6.2 g/dL — ABNORMAL LOW (ref 6.5–8.1)

## 2024-02-14 LAB — CBC WITH DIFFERENTIAL/PLATELET
Abs Immature Granulocytes: 0.01 10*3/uL (ref 0.00–0.07)
Basophils Absolute: 0 10*3/uL (ref 0.0–0.1)
Basophils Relative: 1 %
Eosinophils Absolute: 0.1 10*3/uL (ref 0.0–0.5)
Eosinophils Relative: 3 %
HCT: 32.7 % — ABNORMAL LOW (ref 36.0–46.0)
Hemoglobin: 10.7 g/dL — ABNORMAL LOW (ref 12.0–15.0)
Immature Granulocytes: 0 %
Lymphocytes Relative: 47 %
Lymphs Abs: 2 10*3/uL (ref 0.7–4.0)
MCH: 29.9 pg (ref 26.0–34.0)
MCHC: 32.7 g/dL (ref 30.0–36.0)
MCV: 91.3 fL (ref 80.0–100.0)
Monocytes Absolute: 0.5 10*3/uL (ref 0.1–1.0)
Monocytes Relative: 13 %
Neutro Abs: 1.5 10*3/uL — ABNORMAL LOW (ref 1.7–7.7)
Neutrophils Relative %: 36 %
Platelets: 275 10*3/uL (ref 150–400)
RBC: 3.58 MIL/uL — ABNORMAL LOW (ref 3.87–5.11)
RDW: 15.7 % — ABNORMAL HIGH (ref 11.5–15.5)
WBC: 4.1 10*3/uL (ref 4.0–10.5)
nRBC: 0 % (ref 0.0–0.2)

## 2024-02-14 LAB — GLUCOSE, CAPILLARY
Glucose-Capillary: 108 mg/dL — ABNORMAL HIGH (ref 70–99)
Glucose-Capillary: 120 mg/dL — ABNORMAL HIGH (ref 70–99)
Glucose-Capillary: 125 mg/dL — ABNORMAL HIGH (ref 70–99)
Glucose-Capillary: 141 mg/dL — ABNORMAL HIGH (ref 70–99)
Glucose-Capillary: 86 mg/dL (ref 70–99)
Glucose-Capillary: 87 mg/dL (ref 70–99)

## 2024-02-14 MED ORDER — SODIUM CHLORIDE 0.9% FLUSH: 10.0000 mL | INTRAVENOUS | Status: DC | PRN

## 2024-02-14 MED ORDER — GLUCERNA SHAKE PO LIQD
237.0000 mL | Freq: Three times a day (TID) | ORAL | Status: DC
  Administered 2024-02-14 – 2024-02-15 (×2): 237 mL via ORAL
  Filled 2024-02-14 (×5): qty 237

## 2024-02-14 MED ORDER — MELATONIN 5 MG PO TABS
5.0000 mg | ORAL_TABLET | Freq: Every evening | ORAL | Status: DC | PRN
  Administered 2024-02-14: 5 mg via ORAL
  Filled 2024-02-14: qty 1

## 2024-02-14 MED ORDER — DIPHENHYDRAMINE HCL 25 MG PO CAPS
25.0000 mg | ORAL_CAPSULE | Freq: Once | ORAL | Status: AC
  Administered 2024-02-14: 25 mg via ORAL

## 2024-02-14 NOTE — Plan of Care (Signed)
   Problem: Education: Goal: Knowledge of General Education information will improve Description Including pain rating scale, medication(s)/side effects and non-pharmacologic comfort measures Outcome: Progressing   Problem: Health Behavior/Discharge Planning: Goal: Ability to manage health-related needs will improve Outcome: Progressing

## 2024-02-14 NOTE — Progress Notes (Signed)
 -      Subjective: Primary complaint is itching. She does have some upper abdominal pain still that is at times stabbing and at times more like a dull ache. Reports it is different than the pain she had initially with pancreatitis but also reports it does not feel like constipation or gas pains. She had some nausea with solid food yesterday but would like to try some glucerna this AM. She has a lot of questions around her gynecologic procedures and reports that she has an appointment with GYN-ONC on 6/24.   Objective: Vital signs in last 24 hours: Temp:  [98 F (36.7 C)-98.3 F (36.8 C)] 98.1 F (36.7 C) (06/16 0457) Pulse Rate:  [82-89] 84 (06/16 0457) Resp:  [16-20] 16 (06/16 0457) BP: (125-167)/(67-80) 125/67 (06/16 0457) SpO2:  [96 %-100 %] 96 % (06/16 0457) Last BM Date : 02/13/24  Intake/Output from previous day: 06/15 0701 - 06/16 0700 In: 724.4 [P.O.:440; IV Piggyback:284.4] Out: 1650 [Urine:1650] Intake/Output this shift: No intake/output data recorded.  PE: Gen: NAD, laying in bed Abd: soft, mild tenderness in epigastrium. Mild bloating upper abdomen.  Psych: Grossly neurologically intact, mentation is appropriate.  Lab Results:  Recent Labs    02/12/24 1200 02/14/24 0439  WBC  --  4.1  HGB 10.5* 10.7*  HCT 32.7* 32.7*  PLT  --  275   BMET Recent Labs    02/13/24 0448 02/14/24 0439  NA 138 137  K 3.7 3.8  CL 107 104  CO2 23 22  GLUCOSE 89 88  BUN 6* <5*  CREATININE 1.06* 1.04*  CALCIUM  9.5 9.7   PT/INR No results for input(s): LABPROT, INR in the last 72 hours. CMP     Component Value Date/Time   NA 137 02/14/2024 0439   K 3.8 02/14/2024 0439   CL 104 02/14/2024 0439   CO2 22 02/14/2024 0439   GLUCOSE 88 02/14/2024 0439   BUN <5 (L) 02/14/2024 0439   CREATININE 1.04 (H) 02/14/2024 0439   CALCIUM  9.7 02/14/2024 0439   PROT 6.2 (L) 02/14/2024 0439   ALBUMIN 2.9 (L) 02/14/2024 0439   AST 28 02/14/2024 0439   ALT 15 02/14/2024 0439    ALKPHOS 46 02/14/2024 0439   BILITOT 0.4 02/14/2024 0439   GFRNONAA 56 (L) 02/14/2024 0439   GFRAA 51 (L) 03/08/2016 1618   Lipase     Component Value Date/Time   LIPASE 66 (H) 02/14/2024 0439       Studies/Results: CT ABDOMEN PELVIS W CONTRAST Result Date: 02/13/2024 CLINICAL DATA:  Recent cholecystectomy 01/17/2024, suspected complicated diverticulitis. Also, proximal pancreatitis noted 01/15/2024. Complains of abdominal pain. EXAM: CT ABDOMEN AND PELVIS WITH CONTRAST TECHNIQUE: Multidetector CT imaging of the abdomen and pelvis was performed using the standard protocol following bolus administration of intravenous contrast. RADIATION DOSE REDUCTION: This exam was performed according to the departmental dose-optimization program which includes automated exposure control, adjustment of the mA and/or kV according to patient size and/or use of iterative reconstruction technique. CONTRAST:  OMNIPAQUE  IOHEXOL  300 MG/ML  SOLN COMPARISON:  CTs with contrast dated 01/11/2024, 01/15/2024, 02/08/2024. FINDINGS: Lower chest: Small hiatal hernia. The cardiac size is normal. Lung bases are clear of infiltrates. Mild scarring and bronchiectasis in the lower lobes. Hepatobiliary: Status post cholecystectomy. The liver is slightly steatotic, without mass. No biliary dilatation or biloma. Interval resolved prior finding of minimal fluid in the gallbladder fossa. Pancreas: A mass of hypoenhancing versus nonenhancing tissue, possibly an inflammatory pseudomass or a contained infrapancreatic  hemorrhage, again interposes between the pancreatic head and the proximal third segment of the duodenum, on 10:33 measuring 3.3 x 1.9 cm, on 7:43 is 2.2 cm in height with corresponding prior measurements 3.1 x 1.8 x 1.9 cm although is moderately decreased in size from 01/15/2024. Trace, possible residual inflammatory edema continues to be seen posterior to this and alongside the uncinate process of the pancreas. The mass or  hematoma moderately effaces the IVC lumen on axial images 30-34 of series 10. Rest of the pancreas is unremarkable. There is no ductal dilatation. Spleen: No abnormality. Adrenals/Urinary Tract: There is no adrenal or renal mass. Scattered renovascular calcifications at both renal hila. No urinary stone or obstruction. The bladder partially obscured by streak artifacts from a metallic right hip replacement but normal where visible. Stomach/Bowel: Moderate increased thickened folds in the proximal to mid stomach. No inflammatory change. No small bowel obstruction or inflammation. Normal appendix. Diffuse colonic diverticulosis. There are persisting changes of diverticulitis in the proximal ascending colon, again without evidence of associated bowel perforation or abscess. The mild inflammatory reaction is not significantly changed from 5 days ago. Rest of the diffuse colonic diverticula are uncomplicated. There is contrast throughout the colon into the rectum. Contrast opacifying normal caliber mid to lower abdominal small bowel. Vascular/Lymphatic: Moderate to heavy aortic and branch vessel atherosclerosis. Circumaortic left renal vein. No AAA. No adenopathy is seen. Reproductive: The uterine cavity again dilated with hypodense material distending the cavity up to 3.4 cm diameter. There is asymmetric thickening of the left lateral endometrial layer on 10:61 up to 8 mm, abnormal for a 78 year old. Consider endometrial sampling to exclude carcinoma. The ovaries are not enlarged. Other: No free air, abscess or free fluid, or incarcerated hernia. There is stranding at a trocar insertion site in the subxiphoid area, seen previously. Musculoskeletal: Chronic AVN superior left femoral head, right hip arthroplasty. Osteopenia and degenerative change lumbar spine greatest at L4-5 where there is grade 1 spondylolisthesis, disc space loss and acquired spinal stenosis. No destructive bone lesions are seen, no acute osseous  findings. IMPRESSION: 1. Persisting changes of diverticulitis in the proximal ascending colon, without evidence of associated bowel perforation or abscess. 2. 3.3 x 1.9 cm mass of hypoenhancing versus nonenhancing tissue interposing between the pancreatic head and the proximal third segment of the duodenum, possibly an inflammatory pseudomass or a contained infrapancreatic hemorrhage. This is moderately decreased in size from 01/15/2024 but slightly increased from 5 days ago. Short interval follow-up study recommended. 3. Trace, possible residual inflammatory edema continues to be seen posterior to this and alongside the uncinate process of the pancreas. 4. Increased thickened folds in the proximal to mid stomach. Correlate clinically for gastritis. 5. Dilated uterine cavity with hypodense material distending the cavity up to 3.4 cm diameter. There is asymmetric thickening of the left lateral endometrial layer up to 8 mm, abnormal for a 78 year old. Consider endometrial sampling to exclude carcinoma. 6. Aortic and branch vessel atherosclerosis. 7. Chronic AVN superior left femoral head. 8. Osteopenia and degenerative change. 9. Scarring and bronchiectasis in the lower lobes. Aortic Atherosclerosis (ICD10-I70.0). Electronically Signed   By: Denman Fischer M.D.   On: 02/13/2024 21:16     Anti-infectives: Anti-infectives (From admission, onward)    Start     Dose/Rate Route Frequency Ordered Stop   02/09/24 1000  ceFEPIme  (MAXIPIME ) 2 g in sodium chloride  0.9 % 100 mL IVPB        2 g 200 mL/hr over 30 Minutes Intravenous Daily 02/08/24  1751     02/09/24 0600  metroNIDAZOLE  (FLAGYL ) IVPB 500 mg        500 mg 100 mL/hr over 60 Minutes Intravenous 2 times daily 02/08/24 1751     02/08/24 1600  ceFEPIme  (MAXIPIME ) 2 g in sodium chloride  0.9 % 100 mL IVPB       Placed in And Linked Group   2 g 200 mL/hr over 30 Minutes Intravenous  Once 02/08/24 1556 02/08/24 1722   02/08/24 1600  metroNIDAZOLE  (FLAGYL )  IVPB 500 mg       Placed in And Linked Group   500 mg 100 mL/hr over 60 Minutes Intravenous  Once 02/08/24 1556 02/08/24 1835        Assessment/Plan Ascending colon diverticulitis with microperf Recent lap chole for presumed gallstone pancreatitis with persistent peripancreatic fluid collection  - soft diet as tolerated, added glucerna   - May have some component of chronic pancreatitis, but unclear - CT overnight with persistent diverticulitis without perforation or abscess, pseudomass vs contained infrapancreatic hemorrhage that is decreased from 5/17 but increased in size from a few days ago, trace edema along the uncinate process of the pancreas, increased thickened folds in proximal to mid stomach, dilated uterine cavity with hypodense material distending to cavity and asymmetric thickening of left lateral endometrial layer - mobilize, pulm toilet - labs reviewed, WBC normal - cont abx therapy for diverticulitis (7 day total course should be adequate) - no acute surgical interventions recommended at this time, not sure if having GI weigh in on pancreatic findings in setting of possible chronic pancreatitis would be helpful? - discussed with TRH on the floor   FEN - soft diet   VTE - Lovenox  ID - Maxipime /Flagyl   I reviewed nursing notes, hospitalist notes, last 24 h vitals and pain scores, last 48 h intake and output, last 24 h labs and trends, and last 24 h imaging results.   LOS: 0 days    Annetta Killian , [facility] 02/14/2024, 9:46 AM Please see Amion for pager number during day hours 7:00am-4:30pm or 7:00am -11:30am on weekends

## 2024-02-14 NOTE — Progress Notes (Signed)
 Mobility Specialist - Progress Note   02/14/24 1109  Mobility  Activity Ambulated with assistance in hallway  Level of Assistance Standby assist, set-up cues, supervision of patient - no hands on  Assistive Device Front wheel walker  Distance Ambulated (ft) 500 ft  Activity Response Tolerated well  Mobility Referral Yes  Mobility visit 1 Mobility  Mobility Specialist Stop Time (ACUTE ONLY) 1109   Pt received in bed and agreeable to mobility. No complaints during session. Pt to bed after session with all needs met.    [facility]

## 2024-02-14 NOTE — Progress Notes (Signed)
 PROGRESS NOTE    Alexandria Durham  XBJ:[id number] DOB: May 30, 1946 DOA: 02/08/2024 PCP: Bayard Limbo, NP   Brief Narrative:  78 y.o. female with medical history significant of hypertension, diabetes mellitus type 2, hyperlipidemia, hypothyroidism, GERD, CKD stage IIIa, diverticulosis, tobacco use and recent hospitalization from 01/11/2024-01/21/2024 for acute biliary pancreatitis with acute cholecystitis status post laparoscopic cholecystectomy, status post D&C on 01/17/2024 for thickened endometrium during laparoscopic cholecystectomy presented with worsening abdominal pain.  On presentation, CT of abdomen and pelvis showed findings concerning for acute diverticulitis of the ascending colon with possible microperforation but no drainable abscess.  She was started on IV fluids and antibiotics.  General surgery was consulted.  Assessment & Plan:   Acute diverticulitis with possible microperforation - Imaging as above.  Currently on cefepime  and Flagyl . -General Surgery following.  Currently on soft diet.  Repeat CT on 02/13/2024 showed changes of diverticulitis without bowel perforation or abscess; possible inflammatory pseudomass or contained intrapancreatic hemorrhage, slightly increased from 5 days ago but decreased in size from 01/15/2024 along with dilated uterine cavity with hypodense material in the cavity along with asymmetric thickening -Follow further general surgery recommendations.   Acute pancreatitis -Patient was recently hospitalized for acute biliary pancreatitis and acute cholecystitis needing laparoscopic cholecystectomy - Lipase 114 on presentation.  LFTs within normal limits.  Imaging with no significant bile duct dilatation -Repeat imaging as above.  Lipase 66 today. - Continue analgesics as needed.  Off IV fluids  Dehydration--treated with IV fluids and subsequently discontinued  AKI on CKD stage IIIa - Possibly from above.  Creatinine 1.63 on admission.  Improved with IV  fluids.  Monitor creatinine intermittently.  Creatinine 1.04 today.  Hypomagnesemia - Labs pending today  Diabetes mellitus type 2 - Glucotrol and Januvia on hold.  Continue CBGs with SSI  GERD - Continue PPI  Hypertension -Blood pressure intermittently elevated.  Antihypertensives including hydrochlorothiazide  and Cozaar  are on hold  Hypothyroidism -Continue levothyroxine   Tobacco use - Continue nicotine  patch  Thickened endometrium -Patient noted to have seen GYN oncology 01/31/2024 and scheduled for hysteroscopy, D & C on 02/29/2024. - Outpatient follow-up with GYN oncology.   DVT prophylaxis: Lovenox  Code Status: Full Family Communication: None at bedside Disposition Plan: Status is: Observation The patient will require care spanning > 2 midnights and should be moved to inpatient because: Of severity of illness.  Need for IV antibiotics.    Consultants: General Surgery  Procedures: None  Antimicrobials: Cefepime  and Flagyl  from 02/08/2024 onwards   Subjective: Patient seen and examined at bedside.  Complains of intermittent itching.  Still having intermittent abdominal pain.  Does not feel well.  No fever, seizures, vomiting reported.   Objective: Vitals:   02/13/24 1828 02/13/24 1830 02/13/24 2126 02/14/24 0457  BP:  (!) 167/74 (!) 144/68 125/67  Pulse: 86 86 89 84  Resp: 16  20 16   Temp: 98.1 F (36.7 C)  98 F (36.7 C) 98.1 F (36.7 C)  TempSrc: Oral  Oral Oral  SpO2: 98% 99% 100% 96%  Weight:      Height:        Intake/Output Summary (Last 24 hours) at 02/14/2024 1143 Last data filed at 02/14/2024 1100 Gross per 24 hour  Intake 824.43 ml  Output 1950 ml  Net -1125.57 ml   Filed Weights   02/08/24 1242  Weight: 67 kg    Examination:  General: No acute distress.  Remains on room air.  Looks chronically ill and deconditioned. ENT/neck: No neck masses  or elevation noted respiratory: Bilateral decreased breath bases with scattered crackles CVS:  Rate mostly controlled; S1 and S2 are heard  abdominal: Soft, still slightly tender and distended; no organomegaly, bowel sounds are heard normally Extremities: No clubbing; mild lower extremity edema present  CNS: Awake; still slow to respond.  No focal neurologic deficit.  Able to move extremities Lymph: No obvious palpable lymphadenopathy Skin: No obvious petechia/rashes psych: Flat affect.  Not agitated  Musculoskeletal: No obvious joint tenderness/erythema   Data Reviewed: I have personally reviewed following labs and imaging studies  CBC: Recent Labs  Lab 02/08/24 1305 02/09/24 0657 02/10/24 0524 02/11/24 0420 02/12/24 1200 02/14/24 0439  WBC 5.6 4.1 4.0 4.0  --  4.1  NEUTROABS 2.9 1.5* 2.5 1.7  --  1.5*  HGB 14.2 11.4* 10.6* 9.7* 10.5* 10.7*  HCT 43.2 35.6* 34.0* 30.1* 32.7* 32.7*  MCV 91.1 96.2 96.9 93.8  --  91.3  PLT 235 205 196 200  --  275   Basic Metabolic Panel: Recent Labs  Lab 02/09/24 0521 02/10/24 0524 02/11/24 0420 02/12/24 0454 02/13/24 0448 02/14/24 0439  NA 138 138 139 138 138 137  K 3.8 3.5 3.2* 4.1 3.7 3.8  CL 107 110 110 109 107 104  CO2 22 19* 21* 22 23 22   GLUCOSE 80 154* 123* 94 89 88  BUN 23 13 6* <5* 6* <5*  CREATININE 1.37* 0.95 1.12* 1.08* 1.06* 1.04*  CALCIUM  9.2 9.2 8.7* 9.4 9.5 9.7  MG 1.5* 2.0 1.6*  --  1.6*  --   PHOS 3.2  --   --   --   --   --    GFR: Estimated Creatinine Clearance: 43.3 mL/min (A) (by C-G formula based on SCr of 1.04 mg/dL (H)). Liver Function Tests: Recent Labs  Lab 02/08/24 1305 02/09/24 0521 02/10/24 0524 02/14/24 0439  AST 19 16 13* 28  ALT 19 14 12 15   ALKPHOS 68 50 47 46  BILITOT 1.1 1.0 0.7 0.4  PROT 8.8* 6.5 6.1* 6.2*  ALBUMIN 4.0 3.0* 2.9* 2.9*   Recent Labs  Lab 02/08/24 1305 02/09/24 0521 02/12/24 0454 02/14/24 0439  LIPASE 114* 73* 66* 66*   No results for input(s): AMMONIA in the last 168 hours. Coagulation Profile: No results for input(s): INR, PROTIME in the last 168  hours. Cardiac Enzymes: No results for input(s): CKTOTAL, CKMB, CKMBINDEX, TROPONINI in the last 168 hours. BNP (last 3 results) No results for input(s): PROBNP in the last 8760 hours. HbA1C: No results for input(s): HGBA1C in the last 72 hours. CBG: Recent Labs  Lab 02/13/24 1938 02/13/24 2335 02/14/24 0353 02/14/24 0736 02/14/24 1135  GLUCAP 116* 105* 87 86 125*   Lipid Profile: No results for input(s): CHOL, HDL, LDLCALC, TRIG, CHOLHDL, LDLDIRECT in the last 72 hours. Thyroid Function Tests: Recent Labs    02/12/24 0454  TSH 1.345   Anemia Panel: Recent Labs    02/12/24 0454  VITAMINB12 385   Sepsis Labs: No results for input(s): PROCALCITON, LATICACIDVEN in the last 168 hours.  Recent Results (from the past 240 hours)  Culture, blood (Routine X 2) w Reflex to ID Panel     Status: None   Collection Time: 02/08/24  9:07 PM   Specimen: BLOOD RIGHT ARM  Result Value Ref Range Status   Specimen Description   Final    BLOOD RIGHT ARM Performed at [facility], [address] [place], [place] [address]    Special Requests  Final    BOTTLES DRAWN AEROBIC AND ANAEROBIC Blood Culture results may not be optimal due to an inadequate volume of blood received in culture bottles Performed at [facility], [address], [place], [place] [address]    Culture   Final    NO GROWTH 5 DAYS Performed at [facility], [address] [place], [place] [address]    Report Status 02/13/2024 FINAL  Final  Culture, blood (Routine X 2) w Reflex to ID Panel     Status: None   Collection Time: 02/08/24  9:07 PM   Specimen: BLOOD RIGHT HAND  Result Value Ref Range Status   Specimen Description   Final    BLOOD RIGHT HAND Performed at [facility], [address], [place], [place] [address]    Special Requests   Final    BOTTLES DRAWN AEROBIC AND ANAEROBIC Blood Culture results may not be optimal due to an inadequate  volume of blood received in culture bottles Performed at [facility], [address], [place], [place] [address]    Culture   Final    NO GROWTH 5 DAYS Performed at [facility], [address] [place], [place] [address]    Report Status 02/13/2024 FINAL  Final         Radiology Studies: CT ABDOMEN PELVIS W CONTRAST Result Date: 02/13/2024 CLINICAL DATA:  Recent cholecystectomy 01/17/2024, suspected complicated diverticulitis. Also, proximal pancreatitis noted 01/15/2024. Complains of abdominal pain. EXAM: CT ABDOMEN AND PELVIS WITH CONTRAST TECHNIQUE: Multidetector CT imaging of the abdomen and pelvis was performed using the standard protocol following bolus administration of intravenous contrast. RADIATION DOSE REDUCTION: This exam was performed according to the departmental dose-optimization program which includes automated exposure control, adjustment of the mA and/or kV according to patient size and/or use of iterative reconstruction technique. CONTRAST:  OMNIPAQUE  IOHEXOL  300 MG/ML  SOLN COMPARISON:  CTs with contrast dated 01/11/2024, 01/15/2024, 02/08/2024. FINDINGS: Lower chest: Small hiatal hernia. The cardiac size is normal. Lung bases are clear of infiltrates. Mild scarring and bronchiectasis in the lower lobes. Hepatobiliary: Status post cholecystectomy. The liver is slightly steatotic, without mass. No biliary dilatation or biloma. Interval resolved prior finding of minimal fluid in the gallbladder fossa. Pancreas: A mass of hypoenhancing versus nonenhancing tissue, possibly an inflammatory pseudomass or a contained infrapancreatic hemorrhage, again interposes between the pancreatic head and the proximal third segment of the duodenum, on 10:33 measuring 3.3 x 1.9 cm, on 7:43 is 2.2 cm in height with corresponding prior measurements 3.1 x 1.8 x 1.9 cm although is moderately decreased in size from 01/15/2024. Trace, possible residual inflammatory edema  continues to be seen posterior to this and alongside the uncinate process of the pancreas. The mass or hematoma moderately effaces the IVC lumen on axial images 30-34 of series 10. Rest of the pancreas is unremarkable. There is no ductal dilatation. Spleen: No abnormality. Adrenals/Urinary Tract: There is no adrenal or renal mass. Scattered renovascular calcifications at both renal hila. No urinary stone or obstruction. The bladder partially obscured by streak artifacts from a metallic right hip replacement but normal where visible. Stomach/Bowel: Moderate increased thickened folds in the proximal to mid stomach. No inflammatory change. No small bowel obstruction or inflammation. Normal appendix. Diffuse colonic diverticulosis. There are persisting changes of diverticulitis in the proximal ascending colon, again without evidence of associated bowel perforation or abscess. The mild inflammatory reaction is not significantly changed from 5 days ago.  Rest of the diffuse colonic diverticula are uncomplicated. There is contrast throughout the colon into the rectum. Contrast opacifying normal caliber mid to lower abdominal small bowel. Vascular/Lymphatic: Moderate to heavy aortic and branch vessel atherosclerosis. Circumaortic left renal vein. No AAA. No adenopathy is seen. Reproductive: The uterine cavity again dilated with hypodense material distending the cavity up to 3.4 cm diameter. There is asymmetric thickening of the left lateral endometrial layer on 10:61 up to 8 mm, abnormal for a 78 year old. Consider endometrial sampling to exclude carcinoma. The ovaries are not enlarged. Other: No free air, abscess or free fluid, or incarcerated hernia. There is stranding at a trocar insertion site in the subxiphoid area, seen previously. Musculoskeletal: Chronic AVN superior left femoral head, right hip arthroplasty. Osteopenia and degenerative change lumbar spine greatest at L4-5 where there is grade 1 spondylolisthesis,  disc space loss and acquired spinal stenosis. No destructive bone lesions are seen, no acute osseous findings. IMPRESSION: 1. Persisting changes of diverticulitis in the proximal ascending colon, without evidence of associated bowel perforation or abscess. 2. 3.3 x 1.9 cm mass of hypoenhancing versus nonenhancing tissue interposing between the pancreatic head and the proximal third segment of the duodenum, possibly an inflammatory pseudomass or a contained infrapancreatic hemorrhage. This is moderately decreased in size from 01/15/2024 but slightly increased from 5 days ago. Short interval follow-up study recommended. 3. Trace, possible residual inflammatory edema continues to be seen posterior to this and alongside the uncinate process of the pancreas. 4. Increased thickened folds in the proximal to mid stomach. Correlate clinically for gastritis. 5. Dilated uterine cavity with hypodense material distending the cavity up to 3.4 cm diameter. There is asymmetric thickening of the left lateral endometrial layer up to 8 mm, abnormal for a 78 year old. Consider endometrial sampling to exclude carcinoma. 6. Aortic and branch vessel atherosclerosis. 7. Chronic AVN superior left femoral head. 8. Osteopenia and degenerative change. 9. Scarring and bronchiectasis in the lower lobes. Aortic Atherosclerosis (ICD10-I70.0). Electronically Signed   By: Denman Fischer M.D.   On: 02/13/2024 21:16        Scheduled Meds:  buPROPion   150 mg Oral QPM   diphenhydrAMINE   25 mg Oral Once   enoxaparin  (LOVENOX ) injection  40 mg Subcutaneous Q24H   feeding supplement (GLUCERNA SHAKE)  237 mL Oral TID BM   insulin  aspart  0-9 Units Subcutaneous Q4H   levothyroxine   75 mcg Oral Q0600   nicotine   14 mg Transdermal Daily   pantoprazole   40 mg Oral Daily   sodium chloride  flush  3 mL Intravenous Q12H   Continuous Infusions:  ceFEPime  (MAXIPIME ) IV 2 g (02/14/24 1021)   metronidazole  500 mg (02/14/24 0515)           Audria Leather, MD [facility] 02/14/2024, 11:43 AM

## 2024-02-15 DIAGNOSIS — K5792 Diverticulitis of intestine, part unspecified, without perforation or abscess without bleeding: Secondary | ICD-10-CM | POA: Diagnosis not present

## 2024-02-15 LAB — CBC WITH DIFFERENTIAL/PLATELET
Abs Immature Granulocytes: 0.02 10*3/uL (ref 0.00–0.07)
Basophils Absolute: 0 10*3/uL (ref 0.0–0.1)
Basophils Relative: 1 %
Eosinophils Absolute: 0.1 10*3/uL (ref 0.0–0.5)
Eosinophils Relative: 3 %
HCT: 30.8 % — ABNORMAL LOW (ref 36.0–46.0)
Hemoglobin: 10.2 g/dL — ABNORMAL LOW (ref 12.0–15.0)
Immature Granulocytes: 1 %
Lymphocytes Relative: 44 %
Lymphs Abs: 1.8 10*3/uL (ref 0.7–4.0)
MCH: 30.4 pg (ref 26.0–34.0)
MCHC: 33.1 g/dL (ref 30.0–36.0)
MCV: 91.7 fL (ref 80.0–100.0)
Monocytes Absolute: 0.6 10*3/uL (ref 0.1–1.0)
Monocytes Relative: 14 %
Neutro Abs: 1.5 10*3/uL — ABNORMAL LOW (ref 1.7–7.7)
Neutrophils Relative %: 37 %
Platelets: 269 10*3/uL (ref 150–400)
RBC: 3.36 MIL/uL — ABNORMAL LOW (ref 3.87–5.11)
RDW: 15.9 % — ABNORMAL HIGH (ref 11.5–15.5)
WBC: 3.9 10*3/uL — ABNORMAL LOW (ref 4.0–10.5)
nRBC: 0 % (ref 0.0–0.2)

## 2024-02-15 LAB — COMPREHENSIVE METABOLIC PANEL WITH GFR
ALT: 16 U/L (ref 0–44)
AST: 22 U/L (ref 15–41)
Albumin: 2.9 g/dL — ABNORMAL LOW (ref 3.5–5.0)
Alkaline Phosphatase: 42 U/L (ref 38–126)
Anion gap: 8 (ref 5–15)
BUN: 8 mg/dL (ref 8–23)
CO2: 25 mmol/L (ref 22–32)
Calcium: 9.6 mg/dL (ref 8.9–10.3)
Chloride: 104 mmol/L (ref 98–111)
Creatinine, Ser: 1.05 mg/dL — ABNORMAL HIGH (ref 0.44–1.00)
GFR, Estimated: 55 mL/min — ABNORMAL LOW (ref >60)
Glucose, Bld: 94 mg/dL (ref 70–99)
Potassium: 3.5 mmol/L (ref 3.5–5.1)
Sodium: 137 mmol/L (ref 135–145)
Total Bilirubin: 0.6 mg/dL (ref 0.0–1.2)
Total Protein: 6.1 g/dL — ABNORMAL LOW (ref 6.5–8.1)

## 2024-02-15 LAB — GLUCOSE, CAPILLARY
Glucose-Capillary: 91 mg/dL (ref 70–99)
Glucose-Capillary: 94 mg/dL (ref 70–99)

## 2024-02-15 LAB — MAGNESIUM: Magnesium: 1.6 mg/dL — ABNORMAL LOW (ref 1.7–2.4)

## 2024-02-15 MED ORDER — MAGNESIUM SULFATE 2 GM/50ML IV SOLN
2.0000 g | Freq: Once | INTRAVENOUS | Status: AC
  Administered 2024-02-15: 2 g via INTRAVENOUS
  Filled 2024-02-15: qty 50

## 2024-02-15 MED ORDER — FLUTICASONE PROPIONATE 50 MCG/ACT NA SUSP: 2.0000 | Freq: Every day | NASAL | Status: AC | PRN

## 2024-02-15 MED ORDER — ONDANSETRON HCL 4 MG PO TABS: 4.0000 mg | ORAL_TABLET | Freq: Four times a day (QID) | ORAL | 0 refills | Status: AC | PRN

## 2024-02-15 MED ORDER — CAMPHOR-MENTHOL 0.5-0.5 % EX LOTN
TOPICAL_LOTION | CUTANEOUS | Status: DC | PRN
  Filled 2024-02-15: qty 222

## 2024-02-15 NOTE — Plan of Care (Signed)
   Problem: Education: Goal: Knowledge of General Education information will improve Description Including pain rating scale, medication(s)/side effects and non-pharmacologic comfort measures Outcome: Progressing   Problem: Health Behavior/Discharge Planning: Goal: Ability to manage health-related needs will improve Outcome: Progressing

## 2024-02-15 NOTE — Discharge Summary (Signed)
 Physician Discharge Summary  ARDELLE HALIBURTON UJW:[id number] DOB: Aug 26, 1946 DOA: 02/08/2024  PCP: Bayard Limbo, NP  Admit date: 02/08/2024 Discharge date: 02/15/2024  Admitted From: Home Disposition: Home  Recommendations for Outpatient Follow-up:  Follow up with PCP in 1 week with repeat CBC/BMP Outpatient follow-up with general surgery Follow up in ED if symptoms worsen or new appear   Home Health: Home health PT Equipment/Devices: None  Discharge Condition: Stable CODE STATUS: Full Diet recommendation: Heart healthy/soft diet  Brief/Interim Summary: 78 y.o. female with medical history significant of hypertension, diabetes mellitus type 2, hyperlipidemia, hypothyroidism, GERD, CKD stage IIIa, diverticulosis, tobacco use and recent hospitalization from 01/11/2024-01/21/2024 for acute biliary pancreatitis with acute cholecystitis status post laparoscopic cholecystectomy, status post D&C on 01/17/2024 for thickened endometrium during laparoscopic cholecystectomy presented with worsening abdominal pain.  On presentation, CT of abdomen and pelvis showed findings concerning for acute diverticulitis of the ascending colon with possible microperforation but no drainable abscess.  She was started on IV fluids and antibiotics.  General surgery was consulted: Recommended no surgical intervention.  During her hospitalization, she was managed with antibiotics.  She has completed a week of IV antibiotics and subsequently antibiotics were discontinued on 02/14/2024.  General surgery signed off on 02/14/2024 resolved.  Patient feels better and feels okay to go home today.  Discharge patient home today with outpatient follow-up with PCP and general surgery if needed.  Discharge Diagnoses:   Acute diverticulitis with possible microperforation - Imaging as above.   -General Surgery followed the patient during the hospitalization: Recommended no surgical intervention and signed off on 02/14/2024 and has cleared  the patient for discharge.  Currently on soft diet.  Repeat CT on 02/13/2024 showed changes of diverticulitis without bowel perforation or abscess; possible inflammatory pseudomass or contained intrapancreatic hemorrhage, slightly increased from 5 days ago but decreased in size from 01/15/2024 along with dilated uterine cavity with hypodense material in the cavity along with asymmetric thickening - Completed 1 week of IV cefepime  and Flagyl  and discontinued on 02/14/2024. -Patient feels better and feels okay to go home today.  Discharge patient home today with outpatient follow-up with PCP and general surgery if needed.   Acute pancreatitis -Patient was recently hospitalized for acute biliary pancreatitis and acute cholecystitis needing laparoscopic cholecystectomy - Lipase 114 on presentation.  LFTs within normal limits.  Imaging with no significant bile duct dilatation -Repeat imaging as above.  Lipase 66 on 02/14/2024 - Currently tolerating diet.  Off IV fluids   Dehydration--treated with IV fluids and subsequently discontinued   AKI on CKD stage IIIa - Possibly from above.  Creatinine 1.63 on admission.  Improved with IV fluids.  Monitor creatinine intermittently.  Creatinine 1.05 today.   Hypomagnesemia - Replace.  Continue supplementation on discharge.  Diabetes mellitus type 2 - Resume home regimen.  Carb modified diet.  GERD - Continue PPI   Hypertension -Blood pressure intermittently elevated.  Resume hydrochlorothiazide  and Cozaar     Hypothyroidism -Continue levothyroxine    Tobacco use - Counseled regarding cessation by prior hospitalist.  Outpatient follow-up with PCP.   Thickened endometrium -Patient noted to have seen GYN oncology 01/31/2024 and scheduled for hysteroscopy, D & C on 02/29/2024. - Outpatient follow-up with GYN oncology.    Discharge Instructions  Discharge Instructions     Diet - low sodium heart healthy   Complete by: As directed    Soft diet    Increase activity slowly   Complete by: As directed  Allergies as of 02/15/2024       Reactions   Penicillins Nausea And Vomiting, Palpitations, Dermatitis, Rash, Other (See Comments)   Patient reported an elevated heart rate   Ace Inhibitors Hives, Itching   Tape Other (See Comments)   Skin is sensitive   Aspirin Palpitations   Codeine Palpitations        Medication List     STOP taking these medications    gabapentin  300 MG capsule Commonly known as: NEURONTIN    HYDROcodone -acetaminophen  5-325 MG tablet Commonly known as: NORCO/VICODIN   meclizine 25 MG tablet Commonly known as: ANTIVERT   oxyCODONE  5 MG immediate release tablet Commonly known as: Oxy IR/ROXICODONE    traMADol  50 MG tablet Commonly known as: ULTRAM        TAKE these medications    bisacodyl  5 MG EC tablet Generic drug: bisacodyl  Take 5 mg by mouth daily as needed for moderate constipation.   buPROPion  150 MG 24 hr tablet Commonly known as: WELLBUTRIN  XL Take 150 mg by mouth every evening.   diphenhydramine -acetaminophen  25-500 MG Tabs tablet Commonly known as: TYLENOL  PM Take 2 tablets by mouth at bedtime as needed (Sleep/Pain).   ezetimibe  10 MG tablet Commonly known as: ZETIA  Take 10 mg by mouth daily.   ferrous sulfate 325 (65 FE) MG tablet Take 325 mg by mouth daily with breakfast.   fluticasone  50 MCG/ACT nasal spray Commonly known as: FLONASE  Place 2 sprays into both nostrils daily as needed for rhinitis.   hydrochlorothiazide  25 MG tablet Commonly known as: HYDRODIURIL  Take 1 tablet (25 mg total) by mouth daily.   Januvia 50 MG tablet Generic drug: sitaGLIPtin Take 50 mg by mouth daily.   levothyroxine  75 MCG tablet Commonly known as: SYNTHROID  Take 75 mcg by mouth daily before breakfast.   losartan  50 MG tablet Commonly known as: COZAAR  Take 50 mg by mouth in the morning.   magnesium  oxide 400 MG tablet Commonly known as: MAG-OX Take 400 mg by mouth  daily.   methocarbamol  500 MG tablet Commonly known as: ROBAXIN  Take 1 tablet (500 mg total) by mouth 2 (two) times daily as needed for muscle spasms.   mometasone 0.1 % cream Commonly known as: ELOCON Apply 1 Application topically daily as needed (Dermatitis- affected sites).   omeprazole 40 MG capsule Commonly known as: PRILOSEC Take 40 mg by mouth daily before breakfast.   ondansetron  4 MG tablet Commonly known as: ZOFRAN  Take 1 tablet (4 mg total) by mouth every 6 (six) hours as needed for nausea.   polycarbophil 625 MG tablet Commonly known as: FIBERCON Take 1 tablet (625 mg total) by mouth 2 (two) times daily.   polyethylene glycol powder 17 GM/SCOOP powder Commonly known as: GLYCOLAX /MIRALAX  Take 17 g by mouth 2 (two) times daily as needed for moderate constipation.   triamcinolone ointment 0.5 % Commonly known as: KENALOG Apply 1 Application topically 2 (two) times daily as needed (for itching).   Tylenol  8 Hour Arthritis Pain 650 MG CR tablet Generic drug: acetaminophen  Take 650-1,300 mg by mouth every 8 (eight) hours as needed for pain.   VITAMIN B-12 PO Take 1 tablet by mouth in the morning.   Vitamin D3 25 MCG (1000 UT) Caps Take 1,000 Units by mouth daily.        Follow-up Information     [facility], [facility] Follow up.   Specialty: Home Health Services Why: Home Health Physical Therapy Contact information: [address] [place] [address] [phone]  Bayard Limbo, NP. Schedule an appointment as soon as possible for a visit in 1 week(s).   Specialty: Nurse Practitioner Contact information: [address] [place] [address] [phone]                Allergies  Allergen Reactions   Penicillins Nausea And Vomiting, Palpitations, Dermatitis, Rash and Other (See Comments)    Patient reported an elevated heart rate   Ace Inhibitors Hives and Itching   Tape Other (See Comments)     Skin is sensitive   Aspirin Palpitations   Codeine Palpitations    Consultations: General surgery   Procedures/Studies: CT ABDOMEN PELVIS W CONTRAST Result Date: 02/13/2024 CLINICAL DATA:  Recent cholecystectomy 01/17/2024, suspected complicated diverticulitis. Also, proximal pancreatitis noted 01/15/2024. Complains of abdominal pain. EXAM: CT ABDOMEN AND PELVIS WITH CONTRAST TECHNIQUE: Multidetector CT imaging of the abdomen and pelvis was performed using the standard protocol following bolus administration of intravenous contrast. RADIATION DOSE REDUCTION: This exam was performed according to the departmental dose-optimization program which includes automated exposure control, adjustment of the mA and/or kV according to patient size and/or use of iterative reconstruction technique. CONTRAST:  OMNIPAQUE  IOHEXOL  300 MG/ML  SOLN COMPARISON:  CTs with contrast dated 01/11/2024, 01/15/2024, 02/08/2024. FINDINGS: Lower chest: Small hiatal hernia. The cardiac size is normal. Lung bases are clear of infiltrates. Mild scarring and bronchiectasis in the lower lobes. Hepatobiliary: Status post cholecystectomy. The liver is slightly steatotic, without mass. No biliary dilatation or biloma. Interval resolved prior finding of minimal fluid in the gallbladder fossa. Pancreas: A mass of hypoenhancing versus nonenhancing tissue, possibly an inflammatory pseudomass or a contained infrapancreatic hemorrhage, again interposes between the pancreatic head and the proximal third segment of the duodenum, on 10:33 measuring 3.3 x 1.9 cm, on 7:43 is 2.2 cm in height with corresponding prior measurements 3.1 x 1.8 x 1.9 cm although is moderately decreased in size from 01/15/2024. Trace, possible residual inflammatory edema continues to be seen posterior to this and alongside the uncinate process of the pancreas. The mass or hematoma moderately effaces the IVC lumen on axial images 30-34 of series 10. Rest of the pancreas is  unremarkable. There is no ductal dilatation. Spleen: No abnormality. Adrenals/Urinary Tract: There is no adrenal or renal mass. Scattered renovascular calcifications at both renal hila. No urinary stone or obstruction. The bladder partially obscured by streak artifacts from a metallic right hip replacement but normal where visible. Stomach/Bowel: Moderate increased thickened folds in the proximal to mid stomach. No inflammatory change. No small bowel obstruction or inflammation. Normal appendix. Diffuse colonic diverticulosis. There are persisting changes of diverticulitis in the proximal ascending colon, again without evidence of associated bowel perforation or abscess. The mild inflammatory reaction is not significantly changed from 5 days ago. Rest of the diffuse colonic diverticula are uncomplicated. There is contrast throughout the colon into the rectum. Contrast opacifying normal caliber mid to lower abdominal small bowel. Vascular/Lymphatic: Moderate to heavy aortic and branch vessel atherosclerosis. Circumaortic left renal vein. No AAA. No adenopathy is seen. Reproductive: The uterine cavity again dilated with hypodense material distending the cavity up to 3.4 cm diameter. There is asymmetric thickening of the left lateral endometrial layer on 10:61 up to 8 mm, abnormal for a 78 year old. Consider endometrial sampling to exclude carcinoma. The ovaries are not enlarged. Other: No free air, abscess or free fluid, or incarcerated hernia. There is stranding at a trocar insertion site in the subxiphoid area, seen previously. Musculoskeletal: Chronic AVN  superior left femoral head, right hip arthroplasty. Osteopenia and degenerative change lumbar spine greatest at L4-5 where there is grade 1 spondylolisthesis, disc space loss and acquired spinal stenosis. No destructive bone lesions are seen, no acute osseous findings. IMPRESSION: 1. Persisting changes of diverticulitis in the proximal ascending colon, without  evidence of associated bowel perforation or abscess. 2. 3.3 x 1.9 cm mass of hypoenhancing versus nonenhancing tissue interposing between the pancreatic head and the proximal third segment of the duodenum, possibly an inflammatory pseudomass or a contained infrapancreatic hemorrhage. This is moderately decreased in size from 01/15/2024 but slightly increased from 5 days ago. Short interval follow-up study recommended. 3. Trace, possible residual inflammatory edema continues to be seen posterior to this and alongside the uncinate process of the pancreas. 4. Increased thickened folds in the proximal to mid stomach. Correlate clinically for gastritis. 5. Dilated uterine cavity with hypodense material distending the cavity up to 3.4 cm diameter. There is asymmetric thickening of the left lateral endometrial layer up to 8 mm, abnormal for a 78 year old. Consider endometrial sampling to exclude carcinoma. 6. Aortic and branch vessel atherosclerosis. 7. Chronic AVN superior left femoral head. 8. Osteopenia and degenerative change. 9. Scarring and bronchiectasis in the lower lobes. Aortic Atherosclerosis (ICD10-I70.0). Electronically Signed   By: Denman Fischer M.D.   On: 02/13/2024 21:16   MR CERVICAL SPINE W WO CONTRAST Result Date: 02/11/2024 CLINICAL DATA:  Initial evaluation for sinal stenosis. Spinal stenosis. EXAM: MRI CERVICAL SPINE WITHOUT AND WITH CONTRAST TECHNIQUE: Multiplanar and multiecho pulse sequences of the cervical spine, to include the craniocervical junction and cervicothoracic junction, were obtained without and with intravenous contrast. CONTRAST:  6mL GADAVIST GADOBUTROL 1 MMOL/ML IV SOLN COMPARISON:  Prior study from 08/25/2017. FINDINGS: Alignment: Straightening with mild reversal of the normal cervical lordosis. No significant listhesis. Vertebrae: Vertebral body height maintained without acute or chronic fracture. Bone marrow signal intensity within normal limits. No worrisome osseous lesions.  Scattered degenerative reactive endplate changes present about the C3-4 through C6-7 interspaces. No other abnormal marrow edema or enhancement. Cord: Normal signal and morphology.  No abnormal enhancement. Posterior Fossa, vertebral arteries, paraspinal tissues: Patchy signal abnormality within the visualized pons, most characteristic of chronic microvascular ischemic disease. Craniocervical junction within normal limits. Paraspinous soft tissues normal. Normal flow voids seen within the vertebral arteries bilaterally. Disc levels: C2-C3: Minimal disc bulge. Mild right greater than left facet hypertrophy. No spinal stenosis. Foramina remain patent. C3-C4: Degenerative intervertebral disc space narrowing with diffuse disc osteophyte complex, asymmetric to the left. Posterior component flattens and effaces the ventral thecal sac. Mild cord flattening without cord signal changes. Superimposed left-sided facet degeneration with bony ankylosis. Moderate spinal stenosis. Severe left with mild-to-moderate right C4 foraminal stenosis. C4-C5: Diffuse disc bulge with bilateral uncovertebral spurring. Right greater than left facet hypertrophy. Resultant mild to moderate spinal stenosis. Severe bilateral C5 foraminal stenosis. C5-C6: Degenerative intervertebral disc space narrowing with diffuse disc osteophyte complex. Flattening and partial effacement of the ventral thecal sac. Superimposed mild facet and ligament flavum hypertrophy. Moderate spinal stenosis with mild cord flattening but no cord signal changes. Severe left with moderate right C6 foraminal narrowing. C6-C7: Degenerative disc space narrowing with circumferential disc osteophyte complex. Flattening and partial effacement of the ventral thecal sac. Mild to moderate spinal stenosis. Severe bilateral C7 foraminal narrowing. C7-T1: Minimal disc bulge. Right greater than left facet hypertrophy. No spinal stenosis. Mild left C8 foraminal narrowing. Right neural  foramina remains patent. IMPRESSION: 1. Multilevel cervical spondylosis with resultant  moderate diffuse spinal stenosis at C3-4 through C6-7. Mild cord flattening at these levels but with no cord signal changes to suggest myelopathy. 2. Multifactorial degenerative changes with resultant multilevel foraminal narrowing as above. Notable findings include severe left C4 foraminal stenosis, severe bilateral C5 and C7 foraminal narrowing, with severe left and moderate right C6 foraminal stenosis. Electronically Signed   By: Virgia Griffins M.D.   On: 02/11/2024 20:53   CT ABDOMEN PELVIS W CONTRAST Result Date: 02/08/2024 CLINICAL DATA:  Epigastric pain. Laparoscopic cholecystectomy proximally 1 month ago. EXAM: CT ABDOMEN AND PELVIS WITH CONTRAST TECHNIQUE: Multidetector CT imaging of the abdomen and pelvis was performed using the standard protocol following bolus administration of intravenous contrast. RADIATION DOSE REDUCTION: This exam was performed according to the departmental dose-optimization program which includes automated exposure control, adjustment of the mA and/or kV according to patient size and/or use of iterative reconstruction technique. CONTRAST:  80mL OMNIPAQUE  IOHEXOL  300 MG/ML  SOLN COMPARISON:  01/15/2024 FINDINGS: Lower chest: Descending thoracic aortic atherosclerosis. Cylindrical bronchiectasis in both lower lobes with mild medial scarring or subsegmental atelectasis in both lower lobes. Hepatobiliary: Cholecystectomy. Trace residual fluid along the gallbladder fossa is considered within normal limits for postoperative status. No current biliary dilatation. No significant hepatic parenchymal lesion identified. Pancreas: Abnormal hypoenhancing tissue between the pancreatic head and the proximal transverse duodenum measures 3.1 by 1.8 by 1.9 cm as shown on image 31 series 2. This appears to represent a reduction in size and coalescence of prior inflammation in this region shown on  01/15/2024. I am skeptical that this is a pancreatic malignancy given the evolution in findings. No pseudocyst identified. Spleen: Unremarkable Adrenals/Urinary Tract: Unremarkable. Portions the right distal ureter are somewhat obscured by streak artifact from the right hip implant despite use of metal artifact reduction sequences. Stomach/Bowel: Widespread colonic diverticulosis extending from the cecum through the sigmoid colon, with active diverticulitis in the ascending colon anteriorly shown on image 40 series 11. In addition to inflammatory stranding there are a couple of tiny locules of gas with uncertain relationship with regard to the diverticular wall, this could reflect local micro perforation no free traveling intraperitoneal gas is readily apparent. Normal appendix. Vascular/Lymphatic: Atherosclerosis is present, including aortoiliac atherosclerotic disease. Atheromatous narrowing of the celiac trunk proximally due to dorsal plaque. Reproductive: Continued distended endometrial cavity up to 2.9 cm in stripe thickness. Although possibly from cervical stenosis, endometrial sampling is indicated to exclude endometrial carcinoma. Other: Postoperative findings related to prior port placement in the subxiphoid region. Musculoskeletal: Right total hip prosthesis. 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 Grade 1 anterolisthesis at L4-5 appears degenerative in nature. Lower lumbar spondylosis and degenerative disc disease. IMPRESSION: 1. Acute diverticulitis of the ascending colon with a couple of tiny locules of gas with uncertain relationship with regard to the diverticular wall, this could reflect local microperforation. No free traveling intraperitoneal gas is readily apparent. No drainable abscess. 2. Abnormal hypoenhancing tissue between the pancreatic head and the proximal transverse duodenum measures 3.1 by 1.8 by 1.9 cm. This appears to represent a reduction in size and  coalescence of prior inflammation in this region shown on 01/15/2024. 3. Continued distended endometrial cavity up to 2.9 cm in stripe thickness. Although possibly from cervical stenosis, clinical assessment of the cervix and endometrial sampling is indicated to exclude endometrial carcinoma. 4. 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 5. Cylindrical bronchiectasis in both lower lobes with mild medial scarring or subsegmental atelectasis in both  lower lobes. 6.  Aortic Atherosclerosis (ICD10-I70.0). Electronically Signed   By: Freida Jes M.D.   On: 02/08/2024 15:43   US  PELVIC COMPLETE WITH TRANSVAGINAL Result Date: 02/04/2024 CLINICAL DATA:  Uterine fluid EXAM: ULTRASOUND PELVIS TRANSVAGINAL TECHNIQUE: Transvaginal ultrasound examination of the pelvis was performed including evaluation of the uterus, ovaries, adnexal regions, and pelvic cul-de-sac. COMPARISON:  CT abdomen pelvis 01/15/2024, CT abdomen pelvis 01/02/2024, CT abdomen pelvis 03/06/2016. Ultrasound pelvis 12/07/2023. FINDINGS: Uterus Measurements: 7.8 x 4.5 x 5.1 cm = volume: 93 mL. Endometrium is diffusely thickened with complex fluid with the overall thickness measuring 3.3 cm. Right ovary Not visualized. Left ovary Not visualized. Other findings:  No abnormal free fluid IMPRESSION: Continued abnormal thickened fluid-filled endometrium measuring up to 3.3 cm. Findings may be the result of cervical stenosis, however underlying endometrial carcinoma may also be present. Further evaluation with endometrial sampling should be considered. Electronically Signed   By: Elester Grim M.D.   On: 02/04/2024 13:33   DG Cholangiogram Operative Result Date: 01/17/2024 CLINICAL DATA:  Intraoperative cholangiogram. EXAM: INTRAOPERATIVE CHOLANGIOGRAM TECHNIQUE: Cholangiographic images from the C-arm fluoroscopic device were submitted for interpretation post-operatively. Please see the procedural report for the amount of  contrast and the fluoroscopy time utilized. FLUOROSCOPY: Radiation Exposure Index (as provided by the fluoroscopic device): 4.41 mGy COMPARISON:  Preoperative imaging FINDINGS: Single cine run provided during intraoperative cholangiogram. Contrast injected into the distal cystic duct. There is opacification of the common bile duct, common hepatic duct and intrahepatic ducts. No visible filling defect or visible choledocholithiasis. Air bubbles are seen in the distal common bile duct. Contrast flows freely into the duodenum. Fluoroscopy time 8 seconds. IMPRESSION: Intraoperative cholangiogram without visible choledocholithiasis. Electronically Signed   By: Chadwick Colonel M.D.   On: 01/17/2024 17:40      Subjective: Patient seen and examined at bedside.  Feels weak but feels okay to go home today.  No fever, vomiting, worsening abdominal pain reported.  Discharge Exam: Vitals:   02/14/24 2044 02/15/24 0532  BP: (!) 148/65 (!) 140/70  Pulse: 92 83  Resp: 16 16  Temp: 98.7 F (37.1 C) 98.7 F (37.1 C)  SpO2: 98% 96%    General: Pt is alert, awake, not in acute distress.  Looks chronically ill and deconditioned.  On room air. Cardiovascular: rate controlled, S1/S2 + Respiratory: bilateral decreased breath sounds at bases Abdominal: Soft, mildly tender and distended, bowel sounds + Extremities: Mild lower extremity edema; no cyanosis    The results of significant diagnostics from this hospitalization (including imaging, microbiology, ancillary and laboratory) are listed below for reference.     Microbiology: Recent Results (from the past 240 hours)  Culture, blood (Routine X 2) w Reflex to ID Panel     Status: None   Collection Time: 02/08/24  9:07 PM   Specimen: BLOOD RIGHT ARM  Result Value Ref Range Status   Specimen Description   Final    BLOOD RIGHT ARM Performed at [facility], [address], [place], [place] [address]    Special Requests   Final    BOTTLES DRAWN  AEROBIC AND ANAEROBIC Blood Culture results may not be optimal due to an inadequate volume of blood received in culture bottles Performed at [facility], [address], [place], [place] [address]    Culture   Final    NO GROWTH 5 DAYS Performed at [facility], [address] [place], [place] [address]    Report Status  02/13/2024 FINAL  Final  Culture, blood (Routine X 2) w Reflex to ID Panel     Status: None   Collection Time: 02/08/24  9:07 PM   Specimen: BLOOD RIGHT HAND  Result Value Ref Range Status   Specimen Description   Final    BLOOD RIGHT HAND Performed at [facility], [address], [place], [place] [address]    Special Requests   Final    BOTTLES DRAWN AEROBIC AND ANAEROBIC Blood Culture results may not be optimal due to an inadequate volume of blood received in culture bottles Performed at [facility], [address], [place], [place] [address]    Culture   Final    NO GROWTH 5 DAYS Performed at [facility], [address], [place], [place] [address]    Report Status 02/13/2024 FINAL  Final     Labs: BNP (last 3 results) No results for input(s): BNP in the last 8760 hours. Basic Metabolic Panel: Recent Labs  Lab 02/09/24 0521 02/10/24 0524 02/11/24 0420 02/12/24 0454 02/13/24 0448 02/14/24 0439 02/15/24 0500  NA 138 138 139 138 138 137 137  K 3.8 3.5 3.2* 4.1 3.7 3.8 3.5  CL 107 110 110 109 107 104 104  CO2 22 19* 21* 22 23 22 25   GLUCOSE 80 154* 123* 94 89 88 94  BUN 23 13 6* <5* 6* <5* 8  CREATININE 1.37* 0.95 1.12* 1.08* 1.06* 1.04* 1.05*  CALCIUM  9.2 9.2 8.7* 9.4 9.5 9.7 9.6  MG 1.5* 2.0 1.6*  --  1.6*  --  1.6*  PHOS 3.2  --   --   --   --   --   --    Liver Function Tests: Recent Labs  Lab 02/08/24 1305 02/09/24 0521 02/10/24 0524 02/14/24 0439 02/15/24 0500  AST 19 16 13* 28 22  ALT 19 14 12 15 16   ALKPHOS 68 50 47 46 42  BILITOT 1.1 1.0 0.7 0.4 0.6  PROT 8.8* 6.5 6.1*  6.2* 6.1*  ALBUMIN 4.0 3.0* 2.9* 2.9* 2.9*   Recent Labs  Lab 02/08/24 1305 02/09/24 0521 02/12/24 0454 02/14/24 0439  LIPASE 114* 73* 66* 66*   No results for input(s): AMMONIA in the last 168 hours. CBC: Recent Labs  Lab 02/09/24 0657 02/10/24 0524 02/11/24 0420 02/12/24 1200 02/14/24 0439 02/15/24 0500  WBC 4.1 4.0 4.0  --  4.1 3.9*  NEUTROABS 1.5* 2.5 1.7  --  1.5* 1.5*  HGB 11.4* 10.6* 9.7* 10.5* 10.7* 10.2*  HCT 35.6* 34.0* 30.1* 32.7* 32.7* 30.8*  MCV 96.2 96.9 93.8  --  91.3 91.7  PLT 205 196 200  --  275 269   Cardiac Enzymes: No results for input(s): CKTOTAL, CKMB, CKMBINDEX, TROPONINI in the last 168 hours. BNP: Invalid input(s): POCBNP CBG: Recent Labs  Lab 02/14/24 1546 02/14/24 1913 02/14/24 2347 02/15/24 0350 02/15/24 0708  GLUCAP 141* 120* 108* 91 94   D-Dimer No results for input(s): DDIMER in the last 72 hours. Hgb A1c No results for input(s): HGBA1C in the last 72 hours. Lipid Profile No results for input(s): CHOL, HDL, LDLCALC, TRIG, CHOLHDL, LDLDIRECT in the last 72 hours. Thyroid function studies No results for input(s): TSH, T4TOTAL, T3FREE, THYROIDAB in the last 72 hours.  Invalid input(s): FREET3 Anemia work up No results for input(s): VITAMINB12, FOLATE, FERRITIN, TIBC, IRON, RETICCTPCT in the last 72 hours. Urinalysis    Component Value Date/Time   COLORURINE YELLOW 02/08/2024 1241  APPEARANCEUR HAZY (A) 02/08/2024 1241   LABSPEC 1.043 (H) 02/08/2024 1241   PHURINE 5.0 02/08/2024 1241   GLUCOSEU NEGATIVE 02/08/2024 1241   HGBUR SMALL (A) 02/08/2024 1241   BILIRUBINUR NEGATIVE 02/08/2024 1241   KETONESUR 5 (A) 02/08/2024 1241   PROTEINUR NEGATIVE 02/08/2024 1241   NITRITE NEGATIVE 02/08/2024 1241   LEUKOCYTESUR LARGE (A) 02/08/2024 1241   Sepsis Labs Recent Labs  Lab 02/10/24 0524 02/11/24 0420 02/14/24 0439 02/15/24 0500  WBC 4.0 4.0 4.1 3.9*    Microbiology Recent Results (from the past 240 hours)  Culture, blood (Routine X 2) w Reflex to ID Panel     Status: None   Collection Time: 02/08/24  9:07 PM   Specimen: BLOOD RIGHT ARM  Result Value Ref Range Status   Specimen Description   Final    BLOOD RIGHT ARM Performed at [facility], [address] [place], [place] [address]    Special Requests   Final    BOTTLES DRAWN AEROBIC AND ANAEROBIC Blood Culture results may not be optimal due to an inadequate volume of blood received in culture bottles Performed at [facility], [address] [place], [place] [address]    Culture   Final    NO GROWTH 5 DAYS Performed at [facility], [address] [place], [place] [address]    Report Status 02/13/2024 FINAL  Final  Culture, blood (Routine X 2) w Reflex to ID Panel     Status: None   Collection Time: 02/08/24  9:07 PM   Specimen: BLOOD RIGHT HAND  Result Value Ref Range Status   Specimen Description   Final    BLOOD RIGHT HAND Performed at [facility], [address], [place], [place] [address]    Special Requests   Final    BOTTLES DRAWN AEROBIC AND ANAEROBIC Blood Culture results may not be optimal due to an inadequate volume of blood received in culture bottles Performed at [facility], [address], [place], [place] [address]    Culture   Final    NO GROWTH 5 DAYS Performed at [facility], [address], [place], [place] [address]    Report Status 02/13/2024 FINAL  Final     Time coordinating discharge: 35 minutes  SIGNED:   Audria Leather, MD  [facility] 02/15/2024, 9:29 AM

## 2024-02-20 NOTE — Patient Instructions (Addendum)
 SURGICAL WAITING ROOM VISITATION Patients having surgery or a procedure may have no more than 2 support people in the waiting area - these visitors may rotate in the visitor waiting room.   If the patient needs to stay at the hospital during part of their recovery, the visitor guidelines for inpatient rooms apply.  PRE-OP VISITATION  Pre-op nurse will coordinate an appropriate time for 1 support person to accompany the patient in pre-op.  This support person may not rotate.  This visitor will be contacted when the time is appropriate for the visitor to come back in the pre-op area.  Please refer to the [facility] website for the visitor guidelines for Inpatients (after your surgery is over and you are in a regular room).  You are not required to quarantine at this time prior to your surgery. However, you must do this: Hand Hygiene often Do NOT share personal items Notify your provider if you are in close contact with someone who has COVID or you develop fever 100.4 or greater, new onset of sneezing, cough, sore throat, shortness of breath or body aches.  If you test positive for Covid or have been in contact with anyone that has tested positive in the last 10 days please notify you surgeon.    Your procedure is scheduled on:  Tuesday  February 29, 2024  Report to [facility] Main Entrance: [address] entrance where the [employer] is available.   Report to admitting at: 05:15    AM  Call this number if you have any questions or problems the morning of surgery [phone]  FOLLOW ANY ADDITIONAL PRE OP INSTRUCTIONS YOU RECEIVED FROM YOUR SURGEON'S OFFICE!!!  Do not eat food after Midnight the night prior to your surgery/procedure.  After Midnight you may have the following liquids until  04:30 AM  DAY OF SURGERY  Clear Liquid Diet Water Black Coffee (sugar ok, NO MILK/CREAM OR CREAMERS)  Tea (sugar ok, NO MILK/CREAM OR CREAMERS) regular and decaf                              Plain Jell-O  with no fruit (NO RED)                                           Fruit ices (not with fruit pulp, NO RED)                                     Popsicles (NO RED)                                                                  Juice: NO CITRUS JUICES: only apple, WHITE grape, WHITE cranberry Sports drinks like Gatorade or Powerade (NO RED)                 Oral Hygiene is also important to reduce your risk of infection.        Remember - BRUSH YOUR TEETH THE MORNING OF SURGERY WITH YOUR REGULAR TOOTHPASTE  Do  NOT smoke after Midnight the night before surgery.  STOP TAKING all Vitamins, Herbs and supplements 1 week before your surgery.   Take ONLY these medicines the morning of surgery with A SIP OF WATER: omeprazole, levothyroxine , and you may use your Flonase  nasal spray if needed.   DO NOT TAKE LOSARTAN  or HCTZ  the MORNING of your Surgery.  JANUVIA- Don't take this medication on the morning of your surgery.                    You may not have any metal on your body including hair pins, jewelry, and body piercing  Do not wear make-up, lotions, powders, perfumes  or deodorant  Do not wear nail polish including gel and S&S, artificial / acrylic nails, or any other type of covering on natural nails including finger and toenails. If you have artificial nails, gel coating, etc., that needs to be removed by a nail salon, Please have this removed prior to surgery. Not doing so may mean that your surgery could be cancelled or delayed if the Surgeon or anesthesia staff feels like they are unable to monitor you safely.   Do not shave 48 hours prior to surgery to avoid nicks in your skin which may contribute to postoperative infections.   Contacts, Hearing Aids, dentures or bridgework may not be worn into surgery. DENTURES WILL BE REMOVED PRIOR TO SURGERY PLEASE DO NOT APPLY Poly grip OR ADHESIVES!!!  You may bring a small overnight bag with you on the day of surgery, only pack  items that are not valuable. [facility] IS NOT RESPONSIBLE   FOR VALUABLES THAT ARE LOST OR STOLEN.   Patients discharged on the day of surgery will not be allowed to drive home.  Someone NEEDS to stay with you for the first 24 hours after anesthesia.  Do not bring your home medications to the hospital. The Pharmacy will dispense medications listed on your medication list to you during your admission in the Hospital.  Please read over the following fact sheets you were given: IF YOU HAVE QUESTIONS ABOUT YOUR PRE-OP INSTRUCTIONS, PLEASE CALL [phone]   [facility] - Preparing for Surgery Before surgery, you can play an important role.  Because skin is not sterile, your skin needs to be as free of germs as possible.  You can reduce the number of germs on your skin by washing with CHG (chlorahexidine gluconate) soap before surgery.  CHG is an antiseptic cleaner which kills germs and bonds with the skin to continue killing germs even after washing. Please DO NOT use if you have an allergy to CHG or antibacterial soaps.  If your skin becomes reddened/irritated stop using the CHG and inform your nurse when you arrive at Short Stay. Do not shave (including legs and underarms) for at least 48 hours prior to the first CHG shower.  You may shave your face/neck.  Please follow these instructions carefully:  1.  Shower with CHG Soap the night before surgery and the  morning of surgery.  2.  If you choose to wash your hair, wash your hair first as usual with your normal  shampoo.  3.  After you shampoo, rinse your hair and body thoroughly to remove the shampoo.                             4.  Use CHG as you would any other liquid soap.  You  can apply chg directly to the skin and wash.  Gently with a scrungie or clean washcloth.  5.  Apply the CHG Soap to your body ONLY FROM THE NECK DOWN.   Do not use on face/ open                           Wound or open sores. Avoid contact with eyes, ears mouth and  genitals (private parts).                       Wash face,  Genitals (private parts) with your normal soap.             6.  Wash thoroughly, paying special attention to the area where your  surgery  will be performed.  7.  Thoroughly rinse your body with warm water from the neck down.  8.  DO NOT shower/wash with your normal soap after using and rinsing off the CHG Soap.            9.  Pat yourself dry with a clean towel.            10.  Wear clean pajamas.            11.  Place clean sheets on your bed the night of your first shower and do not  sleep with pets.  ON THE DAY OF SURGERY : Do not apply any lotions/deodorants the morning of surgery.  Please wear clean clothes to the hospital/surgery center.     FAILURE TO FOLLOW THESE INSTRUCTIONS MAY RESULT IN THE CANCELLATION OF YOUR SURGERY  PATIENT SIGNATURE_________________________________  NURSE SIGNATURE__________________________________  ________________________________________________________________________

## 2024-02-20 NOTE — Progress Notes (Signed)
 Patient was Hospitalized from 02-08-24 to 02-15-24 for diverticulitis and severe abdominal pain. She had all the necessary preop labs at that visit, so she was changed from an in-person PST to a Phone call PST interview.   COVID Vaccine received:  []  No [x]  Yes Date of any COVID positive Test in last 90 days:  none  PCP - Mable Hamilton, NP  [phone]  [facility], [place] Cardiologist - Debby Manner, MD  at [facility]-  Phone:) [phone]  fax:)[phone]  Endocrinology- Elizbeth Blanch,  MD  [phone] (Work)  [phone] (Fax)    Chest x-ray - 01-09-2018 2v  Epic EKG - 01-11-2024  Epic  Stress Test -  ECHO -  Cardiac Cath -  CT Coronary Calcium  score:   Bowel Prep - [x]  No  []   Yes ______  Pacemaker / ICD device [x]  No []  Yes   Spinal Cord Stimulator:[x]  No []  Yes       History of Sleep Apnea? [x]  No []  Yes   CPAP used?- [x]  No []  Yes    Does the patient monitor blood sugar?   []  N/A   []  No [x]  Yes  Patient has: []  NO Hx DM   []  Pre-DM   []  DM1  [x]   DM2 Last A1c was: 6.8   on  01-03-2024     Blood Thinner / Instructions: none  Aspirin Instructions:  none Allergic to ASA  ERAS Protocol Ordered: []  No  [x]  Yes PRE-SURGERY []  ENSURE  []  G2   [x]  No Drink Ordered Patient is to be NPO after: 0430  Dental hx: []  Dentures:  []  N/A      [x]  Bridge or Partial: Has partial dentures on top and bottom but she seldom uses them                  []  Loose or Damaged teeth:   Activity level: Able to walk up 2 flights of stairs without becoming significantly short of breath or having chest pain?  [x]  No   []    Yes   Comments: Recent hospitalization for severe abdominal pain (diverticulitis, anorexia, pancreatitis) 02-08-24 to 02-15-24   Anesthesia review: DM2, HTN, GERD, CKD31, Murmur, bilateral carotid artery stenosis, Patient states that she has stopped smoking, last cigs were on 12-14-23.   Patient denies shortness of breath, fever, cough and chest pain at PAT  appointment.  Patient verbalized understanding and agreement to the Pre-Surgical Instructions that were given to them at this PAT appointment. Patient was also educated of the need to review these PAT instructions again prior to her surgery.I reviewed the appropriate phone numbers to call if they have any and questions or concerns.

## 2024-02-22 ENCOUNTER — Encounter (HOSPITAL_COMMUNITY): Payer: Self-pay

## 2024-02-22 ENCOUNTER — Encounter (HOSPITAL_COMMUNITY)
Admission: RE | Admit: 2024-02-22 | Discharge: 2024-02-22 | Disposition: A | Discharge status: Home / Self Care | Source: Ambulatory Visit | Attending: Psychiatry | Admitting: Psychiatry

## 2024-02-22 VITALS — Ht 64.0 in | Wt 142.0 lb

## 2024-02-22 DIAGNOSIS — R9389 Abnormal findings on diagnostic imaging of other specified body structures: Secondary | ICD-10-CM

## 2024-02-22 DIAGNOSIS — Z01818 Encounter for other preprocedural examination: Secondary | ICD-10-CM

## 2024-02-22 HISTORY — DX: Cardiac murmur, unspecified: R01.1

## 2024-02-22 HISTORY — DX: Chronic kidney disease, unspecified: N18.9

## 2024-02-22 HISTORY — DX: Family history of other specified conditions: Z84.89

## 2024-02-22 HISTORY — DX: Hypothyroidism, unspecified: E03.9

## 2024-02-22 HISTORY — DX: Chronic obstructive pulmonary disease, unspecified: J44.9

## 2024-02-22 HISTORY — DX: Gastro-esophageal reflux disease without esophagitis: K21.9

## 2024-02-25 NOTE — Progress Notes (Signed)
 Anesthesia Chart Review   Case: [id number] Date/Time: 02/29/24 0715   Procedures:      DILATATION AND CURETTAGE /HYSTEROSCOPY - myosure, possible ultrasound guidance     US  INTRAOPERATIVE   Anesthesia type: Choice   Diagnosis:      Thickened endometrium [R93.89]     Postmenopause bleeding [N95.0]   Pre-op diagnosis: THICKENED ENDOMETRIUM, POST MENOPAUSAL BLEEDING   Location: [facility] ROOM 05 / [facility]   Surgeons: [name] Mays, MD       DISCUSSION:78 y.o. former smoker with h/o HTN, COPD, hypothyroidism, CKD Stage III, DM II, thickened endometrium, post menopausal bleeding scheduled for above procedure 02/29/2024 with Dr. Mays [name].   Pt s/o D&C 01/06/2024, no complications.  S/p Lap cholecystectomy 01/17/2024 with no complications noted.   Recent admission 6/10-6/17/2025 with abdominal pain.  CT with concerns for acute diverticulitis of ascending colon with possible microperforation, no drainable abscess. General surgery recommend no surgical intervention, treated with antibiotics, sx resolved.   Pt last seen by cardiology 03/16/2023.  Follows for asymmetric septal hypertrophy, carotid artery stenosis, and HTN. Per OV note pt stable, repeat Echo 05/24/2023 with EF 60-65%, no valvular problems, no change from previous imaging.   Echo 05/24/2023 (Care Everywhere) SUMMARY  Basal left ventricular septal hypertrophy  Left ventricular systolic function is normal.  LV ejection fraction = 60-65%.  There is no significant valvular stenosis or regurgitation.  Probably no significant change in comparison with the prior study noted   US  Carotid Doppler 05/24/2023 (Care Everywhere) Conclusion  Right 20-39% diameter reduction of the internal carotid artery. All Doppler velocities within normal limits. No evidence of hemodynamically significant internal carotid artery stenosis. The vertebral artery flow is antegrade.   Left  20-39% diameter reduction of the internal carotid artery. All Doppler  velocities within normal limits. No evidence of hemodynamically significant internal carotid artery stenosis. The vertebral artery flow is antegrade.   VS: Ht 5' 4 (1.626 m)   Wt 64.4 kg   BMI 24.37 kg/m   PROVIDERS: [name], NP is PCP    LABS: Labs reviewed: Acceptable for surgery. (all labs ordered are listed, but only abnormal results are displayed)  Labs Reviewed - No data to display   IMAGES:   EKG:   CV:  Past Medical History:  Diagnosis Date   Arthritis    Chronic kidney disease    CKD 3a   COPD (chronic obstructive pulmonary disease) (HCC)    Diabetes mellitus    Family history of adverse reaction to anesthesia    sister woke up during her eye surgery.   GERD (gastroesophageal reflux disease)    Heart murmur    Hypercholesteremia    Hypertension    Hypothyroidism     Past Surgical History:  Procedure Laterality Date   BREAST CYST EXCISION Left    benign cyst   CHOLECYSTECTOMY N/A 01/17/2024   Procedure: LAPAROSCOPIC CHOLECYSTECTOMY WITH INTRAOPERATIVE CHOLANGIOGRAM;  Surgeon: Sheldon Standing, MD;  Location: [facility];  Service: General;  Laterality: N/A;   DILATION AND CURETTAGE OF UTERUS N/A 01/17/2024   Procedure: DILATION AND CURETTAGE;  Surgeon: [name] Mays, MD;  Location: [facility];  Service: Gynecology;  Laterality: N/A;   EYE SURGERY Left    cataract removal   HEMORRHOID SURGERY     HIP SURGERY     HYSTEROSCOPY WITH D & C N/A 01/06/2024   Procedure: DILATATION AND CURETTAGE /HYSTEROSCOPY;  Surgeon: Cleatus Moccasin, MD;  Location: [facility];  Service: Gynecology;  Laterality: N/A;  PAP  SMEAR (DR TO BRING SUPPLIES)  TUCKERS CARD   JOINT REPLACEMENT     hip replacement   LAPAROSCOPIC LYSIS OF ADHESIONS  01/06/2024   Procedure: LYSIS, ADHESIONS, LAPAROSCOPIC;  Surgeon: Cleatus Moccasin, MD;  Location: [facility];  Service: Gynecology;;   LAPAROSCOPY N/A 01/06/2024   Procedure: LAPAROSCOPY, DIAGNOSTIC;  Surgeon: Cleatus Moccasin, MD;  Location: [facility];   Service: Gynecology;  Laterality: N/A;   THYROID  SURGERY     TONSILLECTOMY     TUBAL LIGATION      MEDICATIONS:  bisacodyl  5 MG EC tablet   buPROPion  (WELLBUTRIN  XL) 150 MG 24 hr tablet   Cholecalciferol  (VITAMIN D3) 25 MCG (1000 UT) CAPS   Cyanocobalamin  (VITAMIN B-12 PO)   diphenhydramine -acetaminophen  (TYLENOL  PM) 25-500 MG TABS tablet   ezetimibe  (ZETIA ) 10 MG tablet   ferrous sulfate 325 (65 FE) MG tablet   fluticasone  (FLONASE ) 50 MCG/ACT nasal spray   hydrochlorothiazide  (HYDRODIURIL ) 25 MG tablet   levothyroxine  (SYNTHROID ) 75 MCG tablet   losartan  (COZAAR ) 50 MG tablet   magnesium  oxide (MAG-OX) 400 MG tablet   methocarbamol  (ROBAXIN ) 500 MG tablet   mometasone (ELOCON) 0.1 % cream   omeprazole (PRILOSEC) 40 MG capsule   ondansetron  (ZOFRAN ) 4 MG tablet   polycarbophil (FIBERCON) 625 MG tablet   polyethylene glycol powder (GLYCOLAX /MIRALAX ) 17 GM/SCOOP powder   sitaGLIPtin (JANUVIA) 50 MG tablet   triamcinolone ointment (KENALOG) 0.5 %   TYLENOL  8 HOUR ARTHRITIS PAIN 650 MG CR tablet   No current facility-administered medications for this encounter.    Harlene Hoots Ward, PA-C [facility] [phone]

## 2024-02-25 NOTE — Anesthesia Preprocedure Evaluation (Signed)
 Anesthesia Evaluation  Patient identified by MRN, date of birth, ID band Patient awake    Reviewed: Allergy & Precautions, NPO status , Patient's Chart, lab work & pertinent test results  Airway Mallampati: II  TM Distance: >3 FB Neck ROM: Full    Dental  (+) Dental Advisory Given, Chipped   Pulmonary COPD, Patient abstained from smoking., former smoker   Pulmonary exam normal breath sounds clear to auscultation       Cardiovascular hypertension, Pt. on medications + Peripheral Vascular Disease  Normal cardiovascular exam Rhythm:Regular Rate:Normal  Echo 05/24/2023 (Care Everywhere) SUMMARY  Basal left ventricular septal hypertrophy  Left ventricular systolic function is normal.  LV ejection fraction = 60-65%.  There is no significant valvular stenosis or regurgitation.  Probably no significant change in comparison with the prior study noted    Neuro/Psych negative neurological ROS  negative psych ROS   GI/Hepatic Neg liver ROS,GERD  ,,  Endo/Other  diabetes, Type 2Hypothyroidism    Renal/GU Renal InsufficiencyRenal disease  negative genitourinary   Musculoskeletal negative musculoskeletal ROS (+)    Abdominal   Peds  Hematology negative hematology ROS (+)   Anesthesia Other Findings   Reproductive/Obstetrics                             Anesthesia Physical Anesthesia Plan  ASA: 3  Anesthesia Plan: General   Post-op Pain Management: Tylenol  PO (pre-op)*   Induction: Intravenous  PONV Risk Score and Plan: 3 and Ondansetron , Dexamethasone  and Treatment may vary due to age or medical condition  Airway Management Planned: LMA  Additional Equipment:   Intra-op Plan:   Post-operative Plan: Extubation in OR  Informed Consent: I have reviewed the patients History and Physical, chart, labs and discussed the procedure including the risks, benefits and alternatives for the proposed  anesthesia with the patient or authorized representative who has indicated his/her understanding and acceptance.     Dental advisory given  Plan Discussed with: CRNA  Anesthesia Plan Comments: (See PAT note 02/22/2024)       Anesthesia Quick Evaluation

## 2024-02-28 ENCOUNTER — Telehealth: Payer: Self-pay | Admitting: *Deleted

## 2024-02-28 NOTE — Telephone Encounter (Signed)
 Attempted to reach patient for pre-op call. Left voicemail requesting call back.

## 2024-02-28 NOTE — Telephone Encounter (Signed)
 Telephone call to check on pre-operative status.  Patient compliant with pre-operative instructions.  Reinforced nothing to eat after midnight. Clear liquids until 0415. Patient to arrive at 0515. No questions or concerns voiced. Pt states she took her Januvia yesterday. Pt's last instructed dose was suppose to be Friday 6/27. Pt states she didn't take it on Saturday. Pre-admission nurse Neville notified and states it's ok and reminded patient not to take it today and tomorrow morning. Pt verbalized understanding.

## 2024-02-29 ENCOUNTER — Ambulatory Visit (HOSPITAL_COMMUNITY)
Admission: RE | Admit: 2024-02-29 | Discharge: 2024-02-29 | Disposition: A | Discharge status: Home / Self Care | Source: Ambulatory Visit | Attending: Psychiatry | Admitting: Psychiatry

## 2024-02-29 ENCOUNTER — Encounter (HOSPITAL_COMMUNITY): Payer: Self-pay | Admitting: Psychiatry

## 2024-02-29 ENCOUNTER — Inpatient Hospital Stay (HOSPITAL_COMMUNITY)
Admission: RE | Admit: 2024-02-29 | Discharge: 2024-04-04 | DRG: 742 | Disposition: A | Discharge status: Skilled Nursing Facility | Attending: Psychiatry | Admitting: Psychiatry

## 2024-02-29 ENCOUNTER — Other Ambulatory Visit: Payer: Self-pay

## 2024-02-29 ENCOUNTER — Ambulatory Visit (HOSPITAL_COMMUNITY): Payer: Self-pay | Admitting: Physician Assistant

## 2024-02-29 ENCOUNTER — Encounter (HOSPITAL_COMMUNITY)
Admission: RE | Disposition: A | Discharge status: Skilled Nursing Facility | Payer: Self-pay | Source: Home / Self Care | Attending: Psychiatry

## 2024-02-29 ENCOUNTER — Ambulatory Visit (HOSPITAL_COMMUNITY): Payer: Self-pay | Admitting: Registered Nurse

## 2024-02-29 DIAGNOSIS — N179 Acute kidney failure, unspecified: Secondary | ICD-10-CM | POA: Diagnosis not present

## 2024-02-29 DIAGNOSIS — E876 Hypokalemia: Secondary | ICD-10-CM | POA: Diagnosis present

## 2024-02-29 DIAGNOSIS — J449 Chronic obstructive pulmonary disease, unspecified: Secondary | ICD-10-CM | POA: Diagnosis present

## 2024-02-29 DIAGNOSIS — R9389 Abnormal findings on diagnostic imaging of other specified body structures: Secondary | ICD-10-CM | POA: Insufficient documentation

## 2024-02-29 DIAGNOSIS — M79606 Pain in leg, unspecified: Secondary | ICD-10-CM | POA: Diagnosis not present

## 2024-02-29 DIAGNOSIS — I129 Hypertensive chronic kidney disease with stage 1 through stage 4 chronic kidney disease, or unspecified chronic kidney disease: Secondary | ICD-10-CM

## 2024-02-29 DIAGNOSIS — E039 Hypothyroidism, unspecified: Secondary | ICD-10-CM | POA: Diagnosis present

## 2024-02-29 DIAGNOSIS — N9971 Accidental puncture and laceration of a genitourinary system organ or structure during a genitourinary system procedure: Secondary | ICD-10-CM | POA: Diagnosis not present

## 2024-02-29 DIAGNOSIS — D696 Thrombocytopenia, unspecified: Secondary | ICD-10-CM | POA: Diagnosis present

## 2024-02-29 DIAGNOSIS — E875 Hyperkalemia: Secondary | ICD-10-CM | POA: Diagnosis not present

## 2024-02-29 DIAGNOSIS — K651 Peritoneal abscess: Secondary | ICD-10-CM | POA: Diagnosis not present

## 2024-02-29 DIAGNOSIS — E43 Unspecified severe protein-calorie malnutrition: Secondary | ICD-10-CM | POA: Diagnosis present

## 2024-02-29 DIAGNOSIS — N950 Postmenopausal bleeding: Secondary | ICD-10-CM | POA: Diagnosis present

## 2024-02-29 DIAGNOSIS — K5900 Constipation, unspecified: Secondary | ICD-10-CM

## 2024-02-29 DIAGNOSIS — K9172 Accidental puncture and laceration of a digestive system organ or structure during other procedure: Secondary | ICD-10-CM | POA: Diagnosis not present

## 2024-02-29 DIAGNOSIS — I509 Heart failure, unspecified: Secondary | ICD-10-CM | POA: Diagnosis not present

## 2024-02-29 DIAGNOSIS — S31109A Unspecified open wound of abdominal wall, unspecified quadrant without penetration into peritoneal cavity, initial encounter: Secondary | ICD-10-CM | POA: Diagnosis not present

## 2024-02-29 DIAGNOSIS — B952 Enterococcus as the cause of diseases classified elsewhere: Secondary | ICD-10-CM | POA: Diagnosis not present

## 2024-02-29 DIAGNOSIS — E119 Type 2 diabetes mellitus without complications: Secondary | ICD-10-CM | POA: Diagnosis not present

## 2024-02-29 DIAGNOSIS — R630 Anorexia: Secondary | ICD-10-CM

## 2024-02-29 DIAGNOSIS — M109 Gout, unspecified: Secondary | ICD-10-CM | POA: Diagnosis not present

## 2024-02-29 DIAGNOSIS — A419 Sepsis, unspecified organism: Secondary | ICD-10-CM | POA: Diagnosis not present

## 2024-02-29 DIAGNOSIS — F419 Anxiety disorder, unspecified: Secondary | ICD-10-CM | POA: Diagnosis present

## 2024-02-29 DIAGNOSIS — J90 Pleural effusion, not elsewhere classified: Secondary | ICD-10-CM

## 2024-02-29 DIAGNOSIS — N189 Chronic kidney disease, unspecified: Secondary | ICD-10-CM | POA: Diagnosis not present

## 2024-02-29 DIAGNOSIS — M47816 Spondylosis without myelopathy or radiculopathy, lumbar region: Secondary | ICD-10-CM

## 2024-02-29 DIAGNOSIS — J9811 Atelectasis: Secondary | ICD-10-CM | POA: Diagnosis not present

## 2024-02-29 DIAGNOSIS — D62 Acute posthemorrhagic anemia: Secondary | ICD-10-CM | POA: Diagnosis not present

## 2024-02-29 DIAGNOSIS — E1122 Type 2 diabetes mellitus with diabetic chronic kidney disease: Secondary | ICD-10-CM | POA: Diagnosis present

## 2024-02-29 DIAGNOSIS — I700 Atherosclerosis of aorta: Secondary | ICD-10-CM | POA: Diagnosis present

## 2024-02-29 DIAGNOSIS — K219 Gastro-esophageal reflux disease without esophagitis: Secondary | ICD-10-CM | POA: Diagnosis present

## 2024-02-29 DIAGNOSIS — N183 Chronic kidney disease, stage 3 unspecified: Secondary | ICD-10-CM | POA: Diagnosis not present

## 2024-02-29 DIAGNOSIS — R011 Cardiac murmur, unspecified: Secondary | ICD-10-CM

## 2024-02-29 DIAGNOSIS — K5720 Diverticulitis of large intestine with perforation and abscess without bleeding: Secondary | ICD-10-CM | POA: Diagnosis not present

## 2024-02-29 DIAGNOSIS — R1084 Generalized abdominal pain: Secondary | ICD-10-CM

## 2024-02-29 DIAGNOSIS — M5412 Radiculopathy, cervical region: Secondary | ICD-10-CM

## 2024-02-29 DIAGNOSIS — Z01818 Encounter for other preprocedural examination: Secondary | ICD-10-CM

## 2024-02-29 DIAGNOSIS — M5416 Radiculopathy, lumbar region: Secondary | ICD-10-CM

## 2024-02-29 DIAGNOSIS — I358 Other nonrheumatic aortic valve disorders: Secondary | ICD-10-CM | POA: Diagnosis present

## 2024-02-29 DIAGNOSIS — Z7984 Long term (current) use of oral hypoglycemic drugs: Secondary | ICD-10-CM

## 2024-02-29 DIAGNOSIS — Z79890 Hormone replacement therapy (postmenopausal): Secondary | ICD-10-CM

## 2024-02-29 DIAGNOSIS — S3769XA Other injury of uterus, initial encounter: Secondary | ICD-10-CM

## 2024-02-29 DIAGNOSIS — E66811 Obesity, class 1: Secondary | ICD-10-CM | POA: Diagnosis present

## 2024-02-29 DIAGNOSIS — Z91048 Other nonmedicinal substance allergy status: Secondary | ICD-10-CM

## 2024-02-29 DIAGNOSIS — Z880 Allergy status to penicillin: Secondary | ICD-10-CM

## 2024-02-29 DIAGNOSIS — N323 Diverticulum of bladder: Secondary | ICD-10-CM | POA: Diagnosis present

## 2024-02-29 DIAGNOSIS — R1030 Lower abdominal pain, unspecified: Secondary | ICD-10-CM

## 2024-02-29 DIAGNOSIS — Z885 Allergy status to narcotic agent status: Secondary | ICD-10-CM

## 2024-02-29 DIAGNOSIS — R0689 Other abnormalities of breathing: Secondary | ICD-10-CM | POA: Diagnosis not present

## 2024-02-29 DIAGNOSIS — R6521 Severe sepsis with septic shock: Secondary | ICD-10-CM | POA: Diagnosis not present

## 2024-02-29 DIAGNOSIS — K631 Perforation of intestine (nontraumatic): Secondary | ICD-10-CM | POA: Diagnosis not present

## 2024-02-29 DIAGNOSIS — N1831 Chronic kidney disease, stage 3a: Secondary | ICD-10-CM | POA: Diagnosis present

## 2024-02-29 DIAGNOSIS — D75839 Thrombocytosis, unspecified: Secondary | ICD-10-CM | POA: Diagnosis not present

## 2024-02-29 DIAGNOSIS — Z96649 Presence of unspecified artificial hip joint: Secondary | ICD-10-CM | POA: Diagnosis present

## 2024-02-29 DIAGNOSIS — Z8249 Family history of ischemic heart disease and other diseases of the circulatory system: Secondary | ICD-10-CM

## 2024-02-29 DIAGNOSIS — Z888 Allergy status to other drugs, medicaments and biological substances status: Secondary | ICD-10-CM

## 2024-02-29 DIAGNOSIS — Z886 Allergy status to analgesic agent status: Secondary | ICD-10-CM

## 2024-02-29 DIAGNOSIS — T8189XD Other complications of procedures, not elsewhere classified, subsequent encounter: Secondary | ICD-10-CM

## 2024-02-29 DIAGNOSIS — E872 Acidosis, unspecified: Secondary | ICD-10-CM | POA: Diagnosis not present

## 2024-02-29 DIAGNOSIS — N854 Malposition of uterus: Secondary | ICD-10-CM | POA: Diagnosis present

## 2024-02-29 DIAGNOSIS — Z9911 Dependence on respirator [ventilator] status: Secondary | ICD-10-CM | POA: Diagnosis not present

## 2024-02-29 DIAGNOSIS — E871 Hypo-osmolality and hyponatremia: Secondary | ICD-10-CM | POA: Diagnosis not present

## 2024-02-29 DIAGNOSIS — R451 Restlessness and agitation: Secondary | ICD-10-CM | POA: Diagnosis not present

## 2024-02-29 DIAGNOSIS — S3769XD Other injury of uterus, subsequent encounter: Secondary | ICD-10-CM

## 2024-02-29 DIAGNOSIS — N924 Excessive bleeding in the premenopausal period: Secondary | ICD-10-CM | POA: Diagnosis not present

## 2024-02-29 DIAGNOSIS — F17210 Nicotine dependence, cigarettes, uncomplicated: Secondary | ICD-10-CM | POA: Diagnosis present

## 2024-02-29 DIAGNOSIS — K5792 Diverticulitis of intestine, part unspecified, without perforation or abscess without bleeding: Secondary | ICD-10-CM | POA: Diagnosis not present

## 2024-02-29 DIAGNOSIS — K921 Melena: Secondary | ICD-10-CM | POA: Diagnosis not present

## 2024-02-29 DIAGNOSIS — Z79899 Other long term (current) drug therapy: Secondary | ICD-10-CM

## 2024-02-29 DIAGNOSIS — L02211 Cutaneous abscess of abdominal wall: Secondary | ICD-10-CM | POA: Diagnosis not present

## 2024-02-29 DIAGNOSIS — B965 Pseudomonas (aeruginosa) (mallei) (pseudomallei) as the cause of diseases classified elsewhere: Secondary | ICD-10-CM | POA: Diagnosis not present

## 2024-02-29 DIAGNOSIS — Y658 Other specified misadventures during surgical and medical care: Secondary | ICD-10-CM | POA: Diagnosis not present

## 2024-02-29 DIAGNOSIS — B9620 Unspecified Escherichia coli [E. coli] as the cause of diseases classified elsewhere: Secondary | ICD-10-CM | POA: Diagnosis not present

## 2024-02-29 DIAGNOSIS — E7800 Pure hypercholesterolemia, unspecified: Secondary | ICD-10-CM | POA: Diagnosis present

## 2024-02-29 DIAGNOSIS — F418 Other specified anxiety disorders: Secondary | ICD-10-CM

## 2024-02-29 DIAGNOSIS — Z6825 Body mass index (BMI) 25.0-25.9, adult: Secondary | ICD-10-CM

## 2024-02-29 DIAGNOSIS — A499 Bacterial infection, unspecified: Secondary | ICD-10-CM | POA: Diagnosis not present

## 2024-02-29 DIAGNOSIS — E11649 Type 2 diabetes mellitus with hypoglycemia without coma: Secondary | ICD-10-CM | POA: Diagnosis not present

## 2024-02-29 HISTORY — PX: LAPAROSCOPY: SHX197

## 2024-02-29 HISTORY — PX: OPERATIVE ULTRASOUND: SHX5996

## 2024-02-29 HISTORY — PX: HYSTEROSCOPY WITH D & C: SHX1775

## 2024-02-29 HISTORY — PX: SIGMOIDOSCOPY: SHX6686

## 2024-02-29 LAB — GLUCOSE, CAPILLARY
Glucose-Capillary: 86 mg/dL (ref 70–99)
Glucose-Capillary: 152 mg/dL — ABNORMAL HIGH (ref 70–99)
Glucose-Capillary: 161 mg/dL — ABNORMAL HIGH (ref 70–99)
Glucose-Capillary: 154 mg/dL — ABNORMAL HIGH (ref 70–99)

## 2024-02-29 SURGERY — DILATATION AND CURETTAGE /HYSTEROSCOPY
Anesthesia: General | Site: Rectum

## 2024-02-29 MED ORDER — SILVER NITRATE-POT NITRATE 75-25 % EX MISC
CUTANEOUS | Status: AC
  Filled 2024-02-29: qty 20

## 2024-02-29 MED ORDER — SUGAMMADEX SODIUM 200 MG/2ML IV SOLN
INTRAVENOUS | Status: DC | PRN
  Administered 2024-02-29: 250 mg via INTRAVENOUS

## 2024-02-29 MED ORDER — FENTANYL CITRATE PF 50 MCG/ML IJ SOSY
PREFILLED_SYRINGE | INTRAMUSCULAR | Status: AC
  Filled 2024-02-29: qty 1

## 2024-02-29 MED ORDER — HYDROMORPHONE HCL 1 MG/ML IJ SOLN
INTRAMUSCULAR | Status: AC
Start: 2024-02-29 — End: 2024-02-29
  Filled 2024-02-29: qty 1

## 2024-02-29 MED ORDER — ORAL CARE MOUTH RINSE: 15.0000 mL | Freq: Once | OROMUCOSAL | Status: AC

## 2024-02-29 MED ORDER — BUPIVACAINE HCL (PF) 0.25 % IJ SOLN
INTRAMUSCULAR | Status: AC
  Filled 2024-02-29: qty 30

## 2024-02-29 MED ORDER — PHENYLEPHRINE 80 MCG/ML (10ML) SYRINGE FOR IV PUSH (FOR BLOOD PRESSURE SUPPORT)
PREFILLED_SYRINGE | INTRAVENOUS | Status: AC
  Filled 2024-02-29: qty 10

## 2024-02-29 MED ORDER — AMISULPRIDE (ANTIEMETIC) 5 MG/2ML IV SOLN
10.0000 mg | Freq: Once | INTRAVENOUS | Status: DC | PRN
Start: 2024-02-29 — End: 2024-02-29

## 2024-02-29 MED ORDER — ALBUMIN HUMAN 5 % IV SOLN
INTRAVENOUS | Status: AC
  Filled 2024-02-29: qty 250

## 2024-02-29 MED ORDER — TRAMADOL HCL 50 MG PO TABS
100.0000 mg | ORAL_TABLET | Freq: Two times a day (BID) | ORAL | Status: DC | PRN
  Administered 2024-02-29 – 2024-03-01 (×2): 100 mg via ORAL
  Filled 2024-02-29 (×2): qty 2

## 2024-02-29 MED ORDER — ALBUMIN HUMAN 5 % IV SOLN: INTRAVENOUS | Status: DC | PRN

## 2024-02-29 MED ORDER — ACETAMINOPHEN 500 MG PO TABS
1000.0000 mg | ORAL_TABLET | Freq: Two times a day (BID) | ORAL | Status: DC
  Administered 2024-02-29 – 2024-03-01 (×2): 1000 mg via ORAL
  Filled 2024-02-29 (×2): qty 2

## 2024-02-29 MED ORDER — ACETAMINOPHEN 500 MG PO TABS
1000.0000 mg | ORAL_TABLET | ORAL | Status: AC
  Administered 2024-02-29: 1000 mg via ORAL
  Filled 2024-02-29: qty 2

## 2024-02-29 MED ORDER — LINAGLIPTIN 5 MG PO TABS
5.0000 mg | ORAL_TABLET | Freq: Every day | ORAL | Status: DC
  Administered 2024-03-01: 5 mg via ORAL
  Filled 2024-02-29: qty 1

## 2024-02-29 MED ORDER — OXYCODONE HCL 5 MG PO TABS
5.0000 mg | ORAL_TABLET | Freq: Once | ORAL | Status: AC
  Administered 2024-02-29: 5 mg via ORAL
  Filled 2024-02-29: qty 1

## 2024-02-29 MED ORDER — ACETAMINOPHEN 10 MG/ML IV SOLN
INTRAVENOUS | Status: AC
  Filled 2024-02-29: qty 100

## 2024-02-29 MED ORDER — PANTOPRAZOLE SODIUM 40 MG PO TBEC
80.0000 mg | DELAYED_RELEASE_TABLET | Freq: Every day | ORAL | Status: DC
  Administered 2024-03-01: 80 mg via ORAL
  Filled 2024-02-29: qty 2

## 2024-02-29 MED ORDER — PHENYLEPHRINE 80 MCG/ML (10ML) SYRINGE FOR IV PUSH (FOR BLOOD PRESSURE SUPPORT)
PREFILLED_SYRINGE | INTRAVENOUS | Status: DC | PRN
  Administered 2024-02-29 (×7): 160 ug via INTRAVENOUS

## 2024-02-29 MED ORDER — DEXAMETHASONE SODIUM PHOSPHATE 4 MG/ML IJ SOLN
INTRAMUSCULAR | Status: DC | PRN
  Administered 2024-02-29: 5 mg via INTRAVENOUS

## 2024-02-29 MED ORDER — OXYCODONE HCL 5 MG PO TABS: 5.0000 mg | ORAL_TABLET | Freq: Once | ORAL | Status: DC | PRN

## 2024-02-29 MED ORDER — ROCURONIUM BROMIDE 10 MG/ML (PF) SYRINGE
PREFILLED_SYRINGE | INTRAVENOUS | Status: DC | PRN
  Administered 2024-02-29: 40 mg via INTRAVENOUS
  Administered 2024-02-29: 20 mg via INTRAVENOUS

## 2024-02-29 MED ORDER — FENTANYL CITRATE (PF) 100 MCG/2ML IJ SOLN
INTRAMUSCULAR | Status: DC | PRN
  Administered 2024-02-29 (×5): 50 ug via INTRAVENOUS

## 2024-02-29 MED ORDER — MIDAZOLAM HCL 2 MG/2ML IJ SOLN
INTRAMUSCULAR | Status: AC
  Filled 2024-02-29: qty 2

## 2024-02-29 MED ORDER — FENTANYL CITRATE PF 50 MCG/ML IJ SOSY
25.0000 ug | PREFILLED_SYRINGE | INTRAMUSCULAR | Status: DC | PRN
  Administered 2024-02-29: 50 ug via INTRAVENOUS
  Administered 2024-02-29 (×2): 25 ug via INTRAVENOUS
  Administered 2024-02-29: 50 ug via INTRAVENOUS

## 2024-02-29 MED ORDER — EPHEDRINE 5 MG/ML INJ
INTRAVENOUS | Status: AC
  Filled 2024-02-29: qty 5

## 2024-02-29 MED ORDER — OXYCODONE HCL 5 MG/5ML PO SOLN: 5.0000 mg | Freq: Once | ORAL | Status: DC | PRN

## 2024-02-29 MED ORDER — FENTANYL CITRATE PF 50 MCG/ML IJ SOSY
PREFILLED_SYRINGE | INTRAMUSCULAR | Status: AC
Start: 2024-02-29 — End: 2024-02-29
  Filled 2024-02-29: qty 1

## 2024-02-29 MED ORDER — FENTANYL CITRATE (PF) 100 MCG/2ML IJ SOLN
INTRAMUSCULAR | Status: AC
  Filled 2024-02-29: qty 2

## 2024-02-29 MED ORDER — SODIUM CHLORIDE 0.45 % IV SOLN: INTRAVENOUS | Status: DC

## 2024-02-29 MED ORDER — PROPOFOL 10 MG/ML IV BOLUS
INTRAVENOUS | Status: DC | PRN
  Administered 2024-02-29 (×2): 100 mg via INTRAVENOUS
  Administered 2024-02-29: 50 mg via INTRAVENOUS

## 2024-02-29 MED ORDER — DEXAMETHASONE SODIUM PHOSPHATE 10 MG/ML IJ SOLN
INTRAMUSCULAR | Status: AC
  Filled 2024-02-29: qty 1

## 2024-02-29 MED ORDER — ONDANSETRON HCL 4 MG/2ML IJ SOLN
INTRAMUSCULAR | Status: AC
  Filled 2024-02-29: qty 2

## 2024-02-29 MED ORDER — LIDOCAINE HCL (CARDIAC) PF 100 MG/5ML IV SOSY
PREFILLED_SYRINGE | INTRAVENOUS | Status: DC | PRN
  Administered 2024-02-29: 40 mg via INTRAVENOUS

## 2024-02-29 MED ORDER — BUPROPION HCL ER (XL) 150 MG PO TB24
150.0000 mg | ORAL_TABLET | Freq: Every evening | ORAL | Status: DC
  Administered 2024-02-29 – 2024-04-03 (×29): 150 mg via ORAL
  Filled 2024-02-29 (×30): qty 1

## 2024-02-29 MED ORDER — INSULIN ASPART 100 UNIT/ML IJ SOLN: 0.0000 [IU] | INTRAMUSCULAR | Status: DC | PRN

## 2024-02-29 MED ORDER — FENTANYL CITRATE (PF) 100 MCG/2ML IJ SOLN
INTRAMUSCULAR | Status: AC
Start: 2024-02-29 — End: 2024-02-29
  Filled 2024-02-29: qty 2

## 2024-02-29 MED ORDER — LIDOCAINE HCL 1 % IJ SOLN
INTRAMUSCULAR | Status: DC | PRN
  Administered 2024-02-29: 10 mL

## 2024-02-29 MED ORDER — ONDANSETRON HCL 4 MG/2ML IJ SOLN: 4.0000 mg | Freq: Four times a day (QID) | INTRAMUSCULAR | Status: DC | PRN

## 2024-02-29 MED ORDER — ROCURONIUM BROMIDE 10 MG/ML (PF) SYRINGE
PREFILLED_SYRINGE | INTRAVENOUS | Status: AC
Start: 2024-02-29 — End: 2024-02-29
  Filled 2024-02-29: qty 10

## 2024-02-29 MED ORDER — SODIUM CHLORIDE 0.9 % IR SOLN
Status: DC | PRN
Start: 2024-02-29 — End: 2024-02-29
  Administered 2024-02-29: 1000 mL via INTRAVESICAL
  Administered 2024-02-29: 3000 mL via INTRAVESICAL

## 2024-02-29 MED ORDER — INSULIN ASPART 100 UNIT/ML IJ SOLN
0.0000 [IU] | Freq: Three times a day (TID) | INTRAMUSCULAR | Status: DC
  Administered 2024-02-29 – 2024-03-01 (×2): 2 [IU] via SUBCUTANEOUS

## 2024-02-29 MED ORDER — DEXAMETHASONE SODIUM PHOSPHATE 10 MG/ML IJ SOLN: 4.0000 mg | INTRAMUSCULAR | Status: DC

## 2024-02-29 MED ORDER — ONDANSETRON HCL 4 MG/2ML IJ SOLN
INTRAMUSCULAR | Status: DC | PRN
  Administered 2024-02-29: 4 mg via INTRAVENOUS

## 2024-02-29 MED ORDER — HYDROMORPHONE HCL 1 MG/ML IJ SOLN
INTRAMUSCULAR | Status: AC
  Filled 2024-02-29: qty 1

## 2024-02-29 MED ORDER — EPHEDRINE SULFATE-NACL 50-0.9 MG/10ML-% IV SOSY
PREFILLED_SYRINGE | INTRAVENOUS | Status: DC | PRN
Start: 2024-02-29 — End: 2024-02-29
  Administered 2024-02-29 (×3): 2.5 mg via INTRAVENOUS

## 2024-02-29 MED ORDER — CHLORHEXIDINE GLUCONATE 0.12 % MT SOLN
15.0000 mL | Freq: Once | OROMUCOSAL | Status: AC
  Administered 2024-02-29: 15 mL via OROMUCOSAL

## 2024-02-29 MED ORDER — LOSARTAN POTASSIUM 50 MG PO TABS
50.0000 mg | ORAL_TABLET | Freq: Every morning | ORAL | Status: DC
  Administered 2024-03-01: 50 mg via ORAL
  Filled 2024-02-29: qty 1

## 2024-02-29 MED ORDER — HYDROMORPHONE HCL 1 MG/ML IJ SOLN
0.2500 mg | INTRAMUSCULAR | Status: DC | PRN
  Administered 2024-02-29 (×5): 0.5 mg via INTRAVENOUS

## 2024-02-29 MED ORDER — LEVOTHYROXINE SODIUM 75 MCG PO TABS
75.0000 ug | ORAL_TABLET | Freq: Every day | ORAL | Status: DC
  Administered 2024-03-01: 75 ug via ORAL
  Filled 2024-02-29: qty 1

## 2024-02-29 MED ORDER — LIDOCAINE HCL (PF) 1 % IJ SOLN
INTRAMUSCULAR | Status: AC
  Filled 2024-02-29: qty 30

## 2024-02-29 MED ORDER — BUPIVACAINE HCL 0.25 % IJ SOLN
INTRAMUSCULAR | Status: DC | PRN
  Administered 2024-02-29: 8 mL

## 2024-02-29 MED ORDER — LIDOCAINE HCL (PF) 2 % IJ SOLN
INTRAMUSCULAR | Status: AC
  Filled 2024-02-29: qty 5

## 2024-02-29 MED ORDER — EZETIMIBE 10 MG PO TABS
10.0000 mg | ORAL_TABLET | Freq: Every day | ORAL | Status: DC
  Administered 2024-03-01: 10 mg via ORAL
  Filled 2024-02-29: qty 1

## 2024-02-29 MED ORDER — ACETAMINOPHEN 10 MG/ML IV SOLN
1000.0000 mg | Freq: Once | INTRAVENOUS | Status: AC
  Administered 2024-02-29: 1000 mg via INTRAVENOUS

## 2024-02-29 MED ORDER — LACTATED RINGERS IV SOLN: INTRAVENOUS | Status: DC

## 2024-02-29 MED ORDER — HYDROCHLOROTHIAZIDE 25 MG PO TABS
25.0000 mg | ORAL_TABLET | Freq: Every day | ORAL | Status: DC
  Administered 2024-03-01: 25 mg via ORAL
  Filled 2024-02-29: qty 1

## 2024-02-29 MED ORDER — DEXMEDETOMIDINE HCL IN NACL 80 MCG/20ML IV SOLN
INTRAVENOUS | Status: DC | PRN
  Administered 2024-02-29: 8 ug via INTRAVENOUS

## 2024-02-29 MED ORDER — PHENYLEPHRINE HCL-NACL 20-0.9 MG/250ML-% IV SOLN
INTRAVENOUS | Status: AC
  Filled 2024-02-29: qty 250

## 2024-02-29 MED ORDER — ONDANSETRON HCL 4 MG PO TABS: 4.0000 mg | ORAL_TABLET | Freq: Four times a day (QID) | ORAL | Status: DC | PRN

## 2024-02-29 MED ORDER — LACTATED RINGERS IV SOLN: INTRAVENOUS | Status: DC | PRN

## 2024-02-29 SURGICAL SUPPLY — 36 items
APPLICATOR ARISTA FLEXITIP XL (MISCELLANEOUS) IMPLANT
BAG COUNTER SPONGE SURGICOUNT (BAG) ×3 IMPLANT
CATH ROBINSON RED A/P 16FR (CATHETERS) ×3 IMPLANT
CNTNR URN SCR LID CUP LEK RST (MISCELLANEOUS) IMPLANT
DEVICE MYOSURE LITE (MISCELLANEOUS) IMPLANT
DEVICE MYOSURE REACH (MISCELLANEOUS) IMPLANT
DILATOR CANAL MILEX (MISCELLANEOUS) IMPLANT
DRAIN CHANNEL 10F 3/8 F FF (DRAIN) IMPLANT
DRAPE HYSTEROSCOPY (MISCELLANEOUS) IMPLANT
DRAPE LAPAROSCOPIC ABDOMINAL (DRAPES) IMPLANT
DRSG TELFA 3X8 NADH STRL (GAUZE/BANDAGES/DRESSINGS) ×3 IMPLANT
EVACUATOR SILICONE 100CC (DRAIN) IMPLANT
GAUZE 4X4 16PLY ~~LOC~~+RFID DBL (SPONGE) ×3 IMPLANT
GLOVE BIO SURGEON STRL SZ 6.5 (GLOVE) IMPLANT
GLOVE BIOGEL PI MICRO STRL 6 (GLOVE) ×6 IMPLANT
GOWN STRL REUS W/ TWL LRG LVL3 (GOWN DISPOSABLE) ×3 IMPLANT
HEMOSTAT ARISTA ABSORB 3G PWDR (HEMOSTASIS) IMPLANT
IV NS IRRIG 3000ML ARTHROMATIC (IV SOLUTION) ×3 IMPLANT
KIT PROCEDURE FLUENT (KITS) IMPLANT
KIT TURNOVER KIT A (KITS) ×3 IMPLANT
LEGGING LITHOTOMY PAIR STRL (DRAPES) ×3 IMPLANT
LOOP CUTTING BIPOLAR 21FR (ELECTRODE) IMPLANT
LUBRICANT JELLY K Y 4OZ (MISCELLANEOUS) ×3 IMPLANT
MANIFOLD NEPTUNE II (INSTRUMENTS) ×3 IMPLANT
PACK LITHOTOMY IV (CUSTOM PROCEDURE TRAY) ×3 IMPLANT
PAD OB MATERNITY 11 LF (PERSONAL CARE ITEMS) IMPLANT
PENCIL SMOKE EVACUATOR (MISCELLANEOUS) IMPLANT
RUMI II 3.0CM BLUE KOH-EFFICIE (DISPOSABLE) IMPLANT
SCRUB CHG 4% DYNA-HEX 4OZ (MISCELLANEOUS) ×3 IMPLANT
SEAL ROD LENS SCOPE MYOSURE (ABLATOR) IMPLANT
SHEET LAVH (DRAPES) IMPLANT
SIGMOIDOSCOPE DISPOSABLE (MISCELLANEOUS) IMPLANT
SYSTEM TISS REMOVAL MYOSURE XL (MISCELLANEOUS) IMPLANT
TOWEL OR 17X26 10 PK STRL BLUE (TOWEL DISPOSABLE) ×3 IMPLANT
WATER STERILE IRR 500ML POUR (IV SOLUTION) ×3 IMPLANT
YANKAUER SUCT BULB TIP 10FT TU (MISCELLANEOUS) IMPLANT

## 2024-02-29 NOTE — Progress Notes (Signed)
 MD Rogelio was secure chatted because patient is c/o abdominal pain 6/10 and it is not time for any pain medication. Last PRN given for pain was tramadol  100 mg q12hrs @1623 , ice was placed on patient's abdominal.

## 2024-02-29 NOTE — Discharge Instructions (Addendum)
 You are being discharged to [name] for [facility]. We will arrange a CT scan for you closer to the date of completion of antibiotics.  The plan at rehab includes: -Continued physical therapy to help increase your strength -Continued IV antibiotics through the PICC line until 04/20/2024 -Twice daily wet to dry dressing changes to the mid abdominal open incision -Continued 5 cc sterile saline JP drain flushes three times daily with daily dressing changes and close monitoring of output -Ostomy care and management. You will need to continue with education for this so you will be prepared when you go home.   Reasons to call the Doctor: Fever - Oral temperature greater than 100.4 degrees Fahrenheit Foul-smelling vaginal discharge Difficulty urinating Nausea and vomiting Increased pain at the site of the incision that is unrelieved with pain medicine. Difficulty breathing with or without chest pain New calf pain especially if only on one side Sudden, continuing increased vaginal bleeding with or without clots.   Contacts: For questions or concerns you should contact:   Dr. Hoy Masters at [phone]   [name], NP at [phone]   After Hours: call [phone] and have the GYN Oncologist paged/contacted (after 5 pm or on the weekends).   Messages sent via mychart are for non-urgent matters and are not responded to after hours so for urgent needs, please call the after hours number.    Interventional Radiology Percutaneous Abscess Drain Placement After Care   This sheet gives you information about how to care for yourself after your procedure. Your health care provider may also give you more specific instructions. Your drain was placed by an interventional radiologist with [facility]. If you have questions or concerns, contact [facility] at [phone].   What is a percutaneous drain?   A drain is a small plastic tube (catheter) that goes into the  fluid collection in your body through your skin.   How long will I need the drain?   How long the drain needs to stay in is determined by where the drain is, how much comes out of the drain each day and if you are having any other surgical procedures.   Interventional radiology will determine when it is time to remove the drain. It is important to follow up as directed so that the drain can be removed as soon as it is safe to do so.   What can I expect after the procedure?   After the procedure, it is common to have:   A small amount of bruising and discomfort in the area where the drainage tube (catheter) was placed.   Sleepiness and fatigue. This should go away after the medicines you were given have worn off.   Follow these instructions at home:   Insertion site care   Check your insertion site when you change the bandage. Check for:   More redness, swelling, or pain.   More fluid or blood.   Warmth.   Pus or a bad smell.   When caring for your insertion site:   Wash your hands with soap and water  for at least 20 seconds before and after you change your bandage (dressing). If soap and water  are not available, use hand sanitizer.   You do not need to change your dressing everyday if it is clean and dry. Change your dressing every 3 days or as needed when it is soiled, wet or becoming dislodged. You will need to change your dressing each time you shower.  Leave stitches (sutures), skin glue, or adhesive strips in place. These skin closures may need to stay in place for 2 weeks or longer. If adhesive strip edges start to loosen and curl up, you may trim the loose edges. Do not remove adhesive strips completely unless your health care provider tells you to do so.   Catheter care   Flush the catheter once per day with 5 mL of 0.9% normal saline unless you are told otherwise by your healthcare provider. This helps to prevent clogs in the catheter.   To disconnect the drain,  turn the clear plastic tube to the left. Attach the saline syringe by placing it on the white end of the drain and turning gently to the right. Once attached gently push the plunger to the 5 mL mark. After you are done flushing, disconnect the syringe by turning to the left and reattach your drainage container   If you have a bulb please be sure the bulb is charged after reconnecting it - to do this pinch the bulb between your thumb and first finger and close the stopper located on the top of the bulb.    Check for fluid leaking from around your catheter (instead of fluid draining through your catheter). This may be a sign that the drain is no longer working correctly.   Write down the following information every time you empty your bag:   The date and time.   The amount of drainage.   Activity   Rest at home for 1-2 days after your procedure.   For the first 48 hours do not lift anything more than 10 lbs (about a gallon of milk). You may perform moderate activities/exercise. Please avoid strenuous activities during this time.   Avoid any activities which may pull on your drain as this can cause your drain to become dislodged.   If you were given a sedative during the procedure, it can affect you for several hours. Do not drive or operate machinery until your health care provider says that it is safe.   General instructions   For mild pain take over-the-counter medications as needed for pain such as Tylenol  or Advil. If you are experiencing severe pain please call our office as this may indicate an issue with your drain.    If you were prescribed an antibiotic medicine, take it as told by your health care provider. Do not stop using the antibiotic even if you start to feel better.   You may shower 24 hours after the drain is placed. To do this cover the insertion site with a water  tight material such as saran wrap and seal the edges with tape, you may also purchase waterproof dressings at  your local drug store. Shower as usual and then remove the water  tight dressing and any gauze/tape underneath it once you have exited the shower and dried off. Allow the area to air dry or pat dry with a clean towel. Once the skin is completely dry place a new gauze dressing. It is important to keep the site dry at all times to prevent infection.   Do not submerge the drain - this means you cannot take baths, swim, use a hot tub, etc. until the drain is removed.    Do not use any products that contain nicotine  or tobacco, such as cigarettes, e-cigarettes, and chewing tobacco. If you need help quitting, ask your health care provider.   Keep all follow-up visits as told by your health care  provider. This is important.   Contact a health care provider if:   You have less than 10 mL of drainage a day for 2-3 days in a row, or as directed by your health care provider.   You have any of these signs of infection:   More redness, swelling, or pain around your incision area.   More fluid or blood coming from your incision area.   Warmth coming from your incision area.   Pus or a bad smell coming from your incision area.   You have fluid leaking from around your catheter (instead of through your catheter).   You are unable to flush the drain.   You have a fever or chills.   You have pain that does not get better with medicine.   You have not been contacted to schedule a drain follow up appointment within 10 days of discharge from the hospital.   Please call [facility] at [phone] with any questions or concerns.   Get help right away if:   Your catheter comes out.   You suddenly stop having drainage from your catheter.   You suddenly have blood in the fluid that is draining from your catheter.   You become dizzy or you faint.   You develop a rash.   You have nausea or vomiting.   You have difficulty breathing or you feel short of breath.   You develop chest  pain.   You have problems with your speech or vision.   You have trouble balancing or moving your arms or legs.   Summary   It is common to have a small amount of bruising and discomfort in the area where the drainage tube (catheter) was placed. You may also have minor discomfort with movement while the drain is in place.   Flush the drain once per day with 5 mL of 0.9% normal saline (unless you were told otherwise by your healthcare provider).    Record the amount of drainage from the bag every time you empty it.   Change the dressing every 3 days or earlier if soiled/wet. Keep the skin dry under the dressing.   You may shower with the drain in place. Do not submerge the drain (no baths, swimming, hot tubs, etc.).   Contact [facility] at [phone] if you have more redness, swelling, or pain around your incision area or if you have pain that does not get better with medicine.   This information is not intended to replace advice given to you by your health care provider. Make sure you discuss any questions you have with your health care provider.   Document Revised: 11/20/2021 Document Reviewed: 08/12/2019   Elsevier Patient Education  2023 [employer]         Interventional Radiology Drain Record   Empty your drain at least once per day. You may empty it as often as needed. Use this form to write down the amount of fluid that has collected in the drainage container. Bring this form with you to your follow-up visits. Please call [facility] at [phone] with any questions or concerns prior to your appointment.   Drain #1 location: ___________________   Date __________ Time __________ Amount __________   Date __________ Time __________ Amount __________   Date __________ Time __________ Amount __________   Date __________ Time __________ Amount __________   Date __________ Time __________ Amount __________   Date __________ Time __________  Amount __________  Date __________ Time __________ Amount __________   Date __________ Time __________ Amount __________   Date __________ Time __________ Amount __________   Date __________ Time __________ Amount __________   Date __________ Time __________ Amount __________   Date __________ Time __________ Amount __________   Date __________ Time __________ Amount __________   Date __________ Time __________ Amount __________

## 2024-02-29 NOTE — Progress Notes (Signed)
 Verbal order by MD Rogelio for one time dose of PO Oxycodone  5mg  now.

## 2024-02-29 NOTE — Op Note (Signed)
 GYNECOLOGIC ONCOLOGY OPERATIVE NOTE  Date of Service: 02/29/2024  Preoperative Diagnosis: Thickened endometrium, postmenopausal bleeding   Postoperative Diagnosis: Same, uterine perforation  Procedures: Hysteroscopy with myosure under ultrasound guidance, diagnostic laparoscopy  Surgeon: Hoy Masters, MD  Assistants: Olam Mill, MD and (an MD assistant was necessary for tissue manipulation, management of robotic instrumentation, retraction and positioning due to the complexity of the case and hospital policies)  Anesthesia: Choice  Estimated Blood Loss: 200 mL    Fluids: 2000 ml crystalloid; 500 ml albumin  Urine Output: 200 ml, clear yellow  Findings: Mildly enlarged anteverted uterus on bimanual exam. On speculum exam, normal appearing cervix. Stenotic internal cervix os. Difficult ultrasound with poor visualization of uterus. Bladder backfilled with some improvement in visualization. Cervix dilated under ultrasound guidance with curette appearing to pass to uterine fundus within a fluid filled endometrial cavity. Hysteroscope then advanced to what appeared to be the uterine fundus but unable to visualize tubal ostomy. Polypoid like tissue sampled with myosure. Given increasing fluid deficit and still challenging ultrasound but increased intraabdominal fluid suggestive of uterine perforation and concern for possible sharp injury to surrounding structures, decision made to proceed with diagnostic laparoscopy. On laparoscopy blood tinged fluid in abdomen. In pelvis, anterior uterine perforation identified with minimal oozing from uterus. Sigmoid colon epiploica tethered to uterine fundus. Disruption of mesentery of rectosigmoid colon to the left of the colon identified with only mild oozing. No stool or bowel contents visualized in the abdomen. Rigid sigmoidoscopy attempted by Dr. Debby but poor visualization due to stool. Bubble test performed and negative. Small bowel run and free  of injury. Flat drain left in the left from the left lower quadrant.   Specimens:  ID Type Source Tests Collected by Time Destination  1 : endometrial curettings Tissue PATH Gyn biopsy SURGICAL PATHOLOGY Masters Hoy, MD 02/29/2024 [phone]     Complications:  None  Indications for Procedure: Alexandria Durham is a 78 y.o. woman with thickened endometrium and dilated uterine cavity with failed sampling attempts previously and prior uterine perforation, presenting for final attempt at endometrial sampling, under ultrasound guidance.  Prior to the procedure, all risks, benefits, and alternatives were discussed and informed surgical consent was signed.  Procedure: Patient was taken to the operating room where genera anesthesia was achieved with LMA.  She was positioned in dorsal lithotomy and prepped and draped in sterile fashion.  The above findings were noted.  Initial quality of ultrasound was poor. A red rubber catheter was inserted into the bladder and the bladder was backfilled with 200ml of saline with some improvement in visualization on ultrasound. A speculum was placed in the vagina. A tenaculum was placed on the anterior lip of the cervix. The cervix was then dilated under ultrasound guidance. Initial resistance was encountered at the internal cervical os but then appeared to open with the dilator appearing to pass to the apparent uterine fundus within a fluid filled endometrial cavity. At this time the hysteroscope was advanced to what appeared to be the uterine fundus with polypoid like tissue visualized. The scope was removed and the dilator replaced into the cavity with placement again checked under ultrasound guidance. The hysteroscope was then reintroduced and using the myosure light, an initial sample of some of the polypoid appearing tissue was obtained. Due to increasing fluid deficit, the procedure was discontinued and the hysteroscope removed with additional free fluid in the abdomen now  noted on ultrasound.  At this time the anesthesia team converted to endotracheal  intubation and obtained an additional IV. Patient was repositioned and re-prepped and draped. A Foley catheter was inserted into the bladder. A 5mm incision was made in the left upper quadrant near Palmer's point.  The abdomen was entered with a 5mm OptiView trocar under direct visualization.  The abdomen was insufflated and additional trocars were placed as follows: a 5mm trocar superior to the umbilics and a 5mm trocar in the left lower quadrant.  All trocars were placed under direct visualization.  The patient was placed in steep Trendelenberg and the bowels were moved into the upper abdomen.  An anterior uterine perforation was identified and disruption of the mesentery of the left rectosigmoid colon was noted with no evidence of stool or bowel contents. At this time, under direct visualization an advincula uterine manipulator was inserted into the uterus to aid in visualization.  Dr. Debby was called to assist in evaluation of the colon. Please see separate procedure note. A bubble test was performed and negative.   The small bowel was then run in its entirety and noted to be free of injury. The pelvis was irrigated and noted to have minimal small oozing from the mesentery. Arista was placed in the pelvis. The LLQ trocar was removed and a flat drain was placed through this laparoscopic site.   All instruments were removed.  The abdomen was desufflated and all ports were removed.  The skin at all incisions was closed with 4-0 Vicryl to reapproximate the subcutaneous tissue and 4-0 monocryl in a subcuticular fashion followed by surgical glue. The drain was secured with a drain stitch. The uterine manipulator was removed.   Patient tolerated the procedure well. Sponge, lap, and instrument counts were correct.  No perioperative antibiotics were indicated for this procedure.  Patient was extubated and taken to the PACU in  stable condition.  Hoy Masters, MD Gynecologic Oncology

## 2024-02-29 NOTE — Anesthesia Procedure Notes (Signed)
 Procedure Name: LMA Insertion Date/Time: 02/29/2024 7:43 AM  Performed by: Christopher Comings, CRNAPre-anesthesia Checklist: Patient identified, Emergency Drugs available, Suction available and Patient being monitored Patient Re-evaluated:Patient Re-evaluated prior to induction Oxygen Delivery Method: Circle system utilized Preoxygenation: Pre-oxygenation with 100% oxygen Induction Type: IV induction Ventilation: Mask ventilation without difficulty LMA: LMA inserted LMA Size: 4.0 Number of attempts: 1 Placement Confirmation: positive ETCO2 and breath sounds checked- equal and bilateral Tube secured with: Tape Dental Injury: Teeth and Oropharynx as per pre-operative assessment

## 2024-02-29 NOTE — Significant Event (Signed)
 CTSP. Discussed surgery and findings. Pt overall doing well. Abdomen soft, appropriately mildly TTP. Small serosanguinous output from drain.

## 2024-02-29 NOTE — Interval H&P Note (Signed)
 History and Physical Interval Note:  02/29/2024 7:25 AM  Alexandria Durham  has presented today for surgery, with the diagnosis of THICKENED ENDOMETRIUM, POST MENOPAUSAL BLEEDING.  The various methods of treatment have been discussed with the patient and family. After consideration of risks, benefits and other options for treatment, the patient has consented to  Procedure(s) with comments: DILATATION AND CURETTAGE /HYSTEROSCOPY (N/A) - myosure, possible ultrasound guidance US  INTRAOPERATIVE (N/A) as a surgical intervention.  The patient's history has been reviewed, patient examined, no change in status, stable for surgery.  I have reviewed the patient's chart and labs.  Questions were answered to the patient's satisfaction.     [name]

## 2024-02-29 NOTE — Anesthesia Procedure Notes (Signed)
 Procedure Name: Intubation Date/Time: 02/29/2024 8:47 AM  Performed by: Christopher Comings, CRNAPre-anesthesia Checklist: Patient identified, Emergency Drugs available, Suction available and Patient being monitored Patient Re-evaluated:Patient Re-evaluated prior to induction Oxygen Delivery Method: Circle system utilized Preoxygenation: Pre-oxygenation with 100% oxygen Induction Type: IV induction Ventilation: Mask ventilation without difficulty Laryngoscope Size: Mac and 4 Grade View: Grade II Tube type: Oral Tube size: 7.0 mm Number of attempts: 1 Airway Equipment and Method: Stylet and Oral airway Placement Confirmation: ETT inserted through vocal cords under direct vision, positive ETCO2 and breath sounds checked- equal and bilateral Secured at: 22 cm Tube secured with: Tape Dental Injury: Teeth and Oropharynx as per pre-operative assessment

## 2024-02-29 NOTE — Op Note (Signed)
 02/29/2024  10:41 AM  PATIENT:  Alexandria Durham  78 y.o. female  Patient Care Team: [name], NP as PCP - General (Nurse Practitioner) Sheldon Standing, MD as Consulting Physician (General Surgery) Tobie Grange, MD as Referring Physician (Endocrinology)  PRE-OPERATIVE DIAGNOSIS:  THICKENED ENDOMETRIUM, POST MENOPAUSAL BLEEDING  POST-OPERATIVE DIAGNOSIS:  THICKENED ENDOMETRIUM, POST MENOPAUSAL BLEEDING UTERINE PERFORATION  PROCEDURE:   HYSTEROSCOPY WITH MYOSURE US  INTRAOPERATIVE LAPAROSCOPY,  DIAGNOSTIC RIGID SIGMOIDOSCOPY   Surgeon(s): [name] Mays, MD Rogelio Planas, MD Debby Hila, MD  ASSISTANT: none   ANESTHESIA:   general  EBL:  Total I/O In: 2500 [I.V.:2000; IV Piggyback:500] Out: 400 [Urine:200; Blood:200]  COUNTS:  YES  PLAN OF CARE: per primary team  PATIENT DISPOSITION:  PACU - hemodynamically stable.  INDICATION: I was called to the OR to evaluate an injury near the sigmoid colon caused by blunt perforation   OR FINDINGS: no full thickness sigmoid colon injury identified  DESCRIPTION: Upon my arrival the patient was prepped and draped with laparoscopic camera in place.  I evaluated an injury noted to the mesentery of the sigmoid colon that was caused by a uterine perforation.  There appeared to be damage to the mesentery of the sigmoid with no active bleeding.  There was no sign of bowel wall injury. The sigmoid was then inflated under irrigation using a rigid sigmoidoscope.  There was no blood noted in the rectum.  I was able to reach the proximal rectum before stool made it difficult to advance.  There was no leak noted under irrigation.  I did not feel that there was a significant colon injury at that time and returned the patient to the care of Dr [name].     Hila JAYSON Debby, MD  Colorectal and General Surgery [facility]

## 2024-02-29 NOTE — Transfer of Care (Signed)
 Immediate Anesthesia Transfer of Care Note  Patient: Alexandria Durham  Procedure(s) Performed: HYSTEROSCOPY WITH MYOSURE (Pelvis) US  INTRAOPERATIVE (Pelvis) LAPAROSCOPY, DIAGNOSTIC (Abdomen) SIGMOIDOSCOPY (Rectum)  Patient Location: PACU  Anesthesia Type:General  Level of Consciousness: drowsy and patient cooperative  Airway & Oxygen Therapy: Patient connected to face mask oxygen  Post-op Assessment: Report given to RN and Post -op Vital signs reviewed and stable  Post vital signs: Reviewed and stable  Last Vitals:  Vitals Value Taken Time  BP 187/81 02/29/24 10:45  Temp    Pulse 100 02/29/24 10:49  Resp 17 02/29/24 10:49  SpO2 100 % 02/29/24 10:49  Vitals shown include unfiled device data.  Last Pain:  Vitals:   02/29/24 0602  TempSrc: Oral  PainSc:          Complications: There were no known notable events for this encounter.

## 2024-03-01 ENCOUNTER — Encounter (HOSPITAL_COMMUNITY)
Admission: RE | Disposition: A | Discharge status: Skilled Nursing Facility | Payer: Self-pay | Source: Home / Self Care | Attending: Psychiatry

## 2024-03-01 ENCOUNTER — Inpatient Hospital Stay (HOSPITAL_COMMUNITY)

## 2024-03-01 ENCOUNTER — Encounter (HOSPITAL_COMMUNITY): Payer: Self-pay | Admitting: Psychiatry

## 2024-03-01 DIAGNOSIS — I129 Hypertensive chronic kidney disease with stage 1 through stage 4 chronic kidney disease, or unspecified chronic kidney disease: Secondary | ICD-10-CM | POA: Diagnosis not present

## 2024-03-01 DIAGNOSIS — N189 Chronic kidney disease, unspecified: Secondary | ICD-10-CM | POA: Diagnosis not present

## 2024-03-01 DIAGNOSIS — K5792 Diverticulitis of intestine, part unspecified, without perforation or abscess without bleeding: Secondary | ICD-10-CM

## 2024-03-01 DIAGNOSIS — A419 Sepsis, unspecified organism: Secondary | ICD-10-CM

## 2024-03-01 DIAGNOSIS — E876 Hypokalemia: Secondary | ICD-10-CM

## 2024-03-01 DIAGNOSIS — K631 Perforation of intestine (nontraumatic): Secondary | ICD-10-CM

## 2024-03-01 DIAGNOSIS — J449 Chronic obstructive pulmonary disease, unspecified: Secondary | ICD-10-CM | POA: Diagnosis not present

## 2024-03-01 DIAGNOSIS — R6521 Severe sepsis with septic shock: Secondary | ICD-10-CM

## 2024-03-01 DIAGNOSIS — N1831 Chronic kidney disease, stage 3a: Secondary | ICD-10-CM

## 2024-03-01 DIAGNOSIS — Z9911 Dependence on respirator [ventilator] status: Secondary | ICD-10-CM

## 2024-03-01 DIAGNOSIS — E119 Type 2 diabetes mellitus without complications: Secondary | ICD-10-CM

## 2024-03-01 HISTORY — PX: SIGMOIDOSCOPY: SHX6686

## 2024-03-01 HISTORY — PX: LAPAROTOMY: SHX154

## 2024-03-01 HISTORY — PX: COLECTOMY WITH COLOSTOMY CREATION/HARTMANN PROCEDURE: SHX6598

## 2024-03-01 HISTORY — PX: MOBILIZATION, SPLENIC FLEXURE, WITH PARTIAL COLECTOMY: SHX7447

## 2024-03-01 HISTORY — PX: CYSTOSCOPY: SHX5120

## 2024-03-01 LAB — BASIC METABOLIC PANEL WITH GFR
Anion gap: 15 (ref 5–15)
Anion gap: 14 (ref 5–15)
BUN: 16 mg/dL (ref 8–23)
BUN: 17 mg/dL (ref 8–23)
CO2: 17 mmol/L — ABNORMAL LOW (ref 22–32)
CO2: 17 mmol/L — ABNORMAL LOW (ref 22–32)
Calcium: 8.7 mg/dL — ABNORMAL LOW (ref 8.9–10.3)
Calcium: 8.3 mg/dL — ABNORMAL LOW (ref 8.9–10.3)
Chloride: 101 mmol/L (ref 98–111)
Chloride: 99 mmol/L (ref 98–111)
Creatinine, Ser: 1.59 mg/dL — ABNORMAL HIGH (ref 0.44–1.00)
Creatinine, Ser: 1.58 mg/dL — ABNORMAL HIGH (ref 0.44–1.00)
GFR, Estimated: 33 mL/min — ABNORMAL LOW (ref >60)
GFR, Estimated: 34 mL/min — ABNORMAL LOW (ref >60)
Glucose, Bld: 146 mg/dL — ABNORMAL HIGH (ref 70–99)
Glucose, Bld: 151 mg/dL — ABNORMAL HIGH (ref 70–99)
Potassium: 3.4 mmol/L — ABNORMAL LOW (ref 3.5–5.1)
Potassium: 4 mmol/L (ref 3.5–5.1)
Sodium: 133 mmol/L — ABNORMAL LOW (ref 135–145)
Sodium: 130 mmol/L — ABNORMAL LOW (ref 135–145)

## 2024-03-01 LAB — GLUCOSE, CAPILLARY
Glucose-Capillary: 158 mg/dL — ABNORMAL HIGH (ref 70–99)
Glucose-Capillary: 140 mg/dL — ABNORMAL HIGH (ref 70–99)
Glucose-Capillary: 135 mg/dL — ABNORMAL HIGH (ref 70–99)

## 2024-03-01 LAB — CBC
HCT: 35.3 % — ABNORMAL LOW (ref 36.0–46.0)
HCT: 40.2 % (ref 36.0–46.0)
Hemoglobin: 11.7 g/dL — ABNORMAL LOW (ref 12.0–15.0)
Hemoglobin: 13.6 g/dL (ref 12.0–15.0)
MCH: 30.7 pg (ref 26.0–34.0)
MCH: 30.7 pg (ref 26.0–34.0)
MCHC: 33.1 g/dL (ref 30.0–36.0)
MCHC: 33.8 g/dL (ref 30.0–36.0)
MCV: 92.7 fL (ref 80.0–100.0)
MCV: 90.7 fL (ref 80.0–100.0)
Platelets: 136 10*3/uL — ABNORMAL LOW (ref 150–400)
Platelets: 104 10*3/uL — ABNORMAL LOW (ref 150–400)
RBC: 3.81 MIL/uL — ABNORMAL LOW (ref 3.87–5.11)
RBC: 4.43 MIL/uL (ref 3.87–5.11)
RDW: 16.1 % — ABNORMAL HIGH (ref 11.5–15.5)
RDW: 15.9 % — ABNORMAL HIGH (ref 11.5–15.5)
WBC: 2.4 10*3/uL — ABNORMAL LOW (ref 4.0–10.5)
WBC: 2.3 10*3/uL — ABNORMAL LOW (ref 4.0–10.5)
nRBC: 0 % (ref 0.0–0.2)
nRBC: 0 % (ref 0.0–0.2)

## 2024-03-01 LAB — ABO/RH: ABO/RH(D): O POS

## 2024-03-01 LAB — SURGICAL PATHOLOGY

## 2024-03-01 LAB — LACTIC ACID, PLASMA: Lactic Acid, Venous: 3.6 mmol/L (ref 0.5–1.9)

## 2024-03-01 LAB — PREPARE RBC (CROSSMATCH)

## 2024-03-01 LAB — MAGNESIUM: Magnesium: 0.8 mg/dL — CL (ref 1.7–2.4)

## 2024-03-01 SURGERY — LAPAROTOMY, EXPLORATORY
Anesthesia: Regional

## 2024-03-01 MED ORDER — PIPERACILLIN-TAZOBACTAM 3.375 G IVPB
INTRAVENOUS | Status: AC
  Administered 2024-03-01: 3.375 g via INTRAVENOUS
  Filled 2024-03-01: qty 50

## 2024-03-01 MED ORDER — VASOPRESSIN 20 UNIT/ML IV SOLN
INTRAVENOUS | Status: AC
Start: 2024-03-01 — End: 2024-03-01
  Filled 2024-03-01: qty 1

## 2024-03-01 MED ORDER — POLYETHYLENE GLYCOL 3350 17 G PO PACK
17.0000 g | PACK | Freq: Every day | ORAL | Status: DC
  Administered 2024-03-03: 17 g
  Filled 2024-03-01: qty 1

## 2024-03-01 MED ORDER — FENTANYL CITRATE PF 50 MCG/ML IJ SOSY: 25.0000 ug | PREFILLED_SYRINGE | Freq: Once | INTRAMUSCULAR | Status: DC

## 2024-03-01 MED ORDER — ROCURONIUM BROMIDE 100 MG/10ML IV SOLN
INTRAVENOUS | Status: DC | PRN
  Administered 2024-03-01: 30 mg via INTRAVENOUS
  Administered 2024-03-01: 20 mg via INTRAVENOUS
  Administered 2024-03-01: 70 mg via INTRAVENOUS

## 2024-03-01 MED ORDER — LACTATED RINGERS IV SOLN: INTRAVENOUS | Status: DC | PRN

## 2024-03-01 MED ORDER — DEXAMETHASONE SODIUM PHOSPHATE 4 MG/ML IJ SOLN
INTRAMUSCULAR | Status: DC | PRN
  Administered 2024-03-01: 5 mg via INTRAVENOUS

## 2024-03-01 MED ORDER — KETAMINE HCL 50 MG/5ML IJ SOSY
PREFILLED_SYRINGE | INTRAMUSCULAR | Status: AC
Start: 2024-03-01 — End: 2024-03-01
  Filled 2024-03-01: qty 5

## 2024-03-01 MED ORDER — ONDANSETRON HCL 4 MG/2ML IJ SOLN
INTRAMUSCULAR | Status: DC | PRN
  Administered 2024-03-01: 4 mg via INTRAVENOUS

## 2024-03-01 MED ORDER — MIDAZOLAM HCL 2 MG/2ML IJ SOLN
INTRAMUSCULAR | Status: AC
  Filled 2024-03-01: qty 2

## 2024-03-01 MED ORDER — SIMETHICONE 80 MG PO CHEW: 80.0000 mg | CHEWABLE_TABLET | Freq: Four times a day (QID) | ORAL | Status: DC | PRN

## 2024-03-01 MED ORDER — PIPERACILLIN-TAZOBACTAM 3.375 G IVPB: 3.3750 g | Freq: Three times a day (TID) | INTRAVENOUS | Status: DC

## 2024-03-01 MED ORDER — PHENYLEPHRINE HCL-NACL 20-0.9 MG/250ML-% IV SOLN
INTRAVENOUS | Status: DC | PRN
  Administered 2024-03-01: 100 ug/min via INTRAVENOUS

## 2024-03-01 MED ORDER — PHENYLEPHRINE HCL-NACL 20-0.9 MG/250ML-% IV SOLN
INTRAVENOUS | Status: AC
  Filled 2024-03-01: qty 250

## 2024-03-01 MED ORDER — METHOCARBAMOL 500 MG PO TABS
500.0000 mg | ORAL_TABLET | Freq: Two times a day (BID) | ORAL | Status: DC
  Administered 2024-03-01: 500 mg via ORAL
  Filled 2024-03-01: qty 1

## 2024-03-01 MED ORDER — HYDROMORPHONE HCL 1 MG/ML IJ SOLN
0.5000 mg | INTRAMUSCULAR | Status: DC | PRN
  Administered 2024-03-03 (×2): 0.5 mg via INTRAVENOUS
  Filled 2024-03-01 (×3): qty 1

## 2024-03-01 MED ORDER — LIDOCAINE HCL (CARDIAC) PF 100 MG/5ML IV SOSY
PREFILLED_SYRINGE | INTRAVENOUS | Status: DC | PRN
  Administered 2024-03-01: 100 mg via INTRAVENOUS

## 2024-03-01 MED ORDER — CALCIUM CHLORIDE 10 % IV SOLN
INTRAVENOUS | Status: DC | PRN
  Administered 2024-03-01 (×2): .5 g via INTRAVENOUS

## 2024-03-01 MED ORDER — 0.9 % SODIUM CHLORIDE (POUR BTL) OPTIME
TOPICAL | Status: DC | PRN
  Administered 2024-03-01: 2000 mL

## 2024-03-01 MED ORDER — DOCUSATE SODIUM 50 MG/5ML PO LIQD
100.0000 mg | Freq: Two times a day (BID) | ORAL | Status: DC
  Administered 2024-03-02 – 2024-03-03 (×2): 100 mg
  Filled 2024-03-01 (×2): qty 10

## 2024-03-01 MED ORDER — PHENYLEPHRINE HCL-NACL 20-0.9 MG/250ML-% IV SOLN
0.0000 ug/min | INTRAVENOUS | Status: DC
  Administered 2024-03-01 – 2024-03-02 (×2): 50 ug/min via INTRAVENOUS
  Administered 2024-03-02: 80 ug/min via INTRAVENOUS
  Administered 2024-03-02 (×2): 70 ug/min via INTRAVENOUS
  Administered 2024-03-03: 20 ug/min via INTRAVENOUS
  Administered 2024-03-03 (×2): 80 ug/min via INTRAVENOUS
  Filled 2024-03-01 (×9): qty 250

## 2024-03-01 MED ORDER — SODIUM CHLORIDE 0.45 % IV SOLN: INTRAVENOUS | Status: DC

## 2024-03-01 MED ORDER — CALCIUM CHLORIDE 10 % IV SOLN
INTRAVENOUS | Status: AC
  Filled 2024-03-01: qty 10

## 2024-03-01 MED ORDER — FENTANYL BOLUS VIA INFUSION
25.0000 ug | INTRAVENOUS | Status: DC | PRN
  Administered 2024-03-02 – 2024-03-03 (×5): 50 ug via INTRAVENOUS
  Administered 2024-03-03: 25 ug via INTRAVENOUS

## 2024-03-01 MED ORDER — LACTATED RINGERS IV SOLN: INTRAVENOUS | Status: DC

## 2024-03-01 MED ORDER — ACETAMINOPHEN 10 MG/ML IV SOLN
1000.0000 mg | Freq: Four times a day (QID) | INTRAVENOUS | Status: AC
  Administered 2024-03-01 – 2024-03-02 (×3): 1000 mg via INTRAVENOUS
  Filled 2024-03-01 (×3): qty 100

## 2024-03-01 MED ORDER — HEPARIN SODIUM (PORCINE) 5000 UNIT/ML IJ SOLN
INTRAMUSCULAR | Status: AC
  Administered 2024-03-01: 5000 [IU] via SUBCUTANEOUS
  Filled 2024-03-01: qty 1

## 2024-03-01 MED ORDER — MAGNESIUM SULFATE 50 % IJ SOLN
6.0000 g | Freq: Once | INTRAVENOUS | Status: AC
  Administered 2024-03-01: 6 g via INTRAVENOUS
  Filled 2024-03-01: qty 10

## 2024-03-01 MED ORDER — HYDROMORPHONE HCL 2 MG/ML IJ SOLN
INTRAMUSCULAR | Status: AC
Start: 2024-03-01 — End: 2024-03-01
  Filled 2024-03-01: qty 1

## 2024-03-01 MED ORDER — OXYCODONE HCL 5 MG PO TABS: 5.0000 mg | ORAL_TABLET | ORAL | Status: DC | PRN

## 2024-03-01 MED ORDER — OXYCODONE HCL 5 MG PO TABS
5.0000 mg | ORAL_TABLET | Freq: Once | ORAL | Status: AC
  Administered 2024-03-01: 5 mg via ORAL
  Filled 2024-03-01: qty 1

## 2024-03-01 MED ORDER — FENTANYL CITRATE (PF) 250 MCG/5ML IJ SOLN
INTRAMUSCULAR | Status: AC
Start: 2024-03-01 — End: 2024-03-01
  Filled 2024-03-01: qty 5

## 2024-03-01 MED ORDER — STERILE WATER FOR IRRIGATION IR SOLN
Status: DC | PRN
  Administered 2024-03-01: 3000 mL

## 2024-03-01 MED ORDER — PROPOFOL 1000 MG/100ML IV EMUL
INTRAVENOUS | Status: AC
  Administered 2024-03-01: 75 ug/kg/min via INTRAVENOUS
  Filled 2024-03-01: qty 100

## 2024-03-01 MED ORDER — KETAMINE HCL 50 MG/5ML IJ SOSY
PREFILLED_SYRINGE | INTRAMUSCULAR | Status: DC | PRN
  Administered 2024-03-01 (×2): 10 mg via INTRAVENOUS
  Administered 2024-03-01: 30 mg via INTRAVENOUS

## 2024-03-01 MED ORDER — HEPARIN SODIUM (PORCINE) 5000 UNIT/ML IJ SOLN
5000.0000 [IU] | INTRAMUSCULAR | Status: AC
  Filled 2024-03-01: qty 1

## 2024-03-01 MED ORDER — PHENYLEPHRINE 80 MCG/ML (10ML) SYRINGE FOR IV PUSH (FOR BLOOD PRESSURE SUPPORT)
PREFILLED_SYRINGE | INTRAVENOUS | Status: AC
  Filled 2024-03-01: qty 10

## 2024-03-01 MED ORDER — PROPOFOL 10 MG/ML IV BOLUS
INTRAVENOUS | Status: AC
  Filled 2024-03-01: qty 20

## 2024-03-01 MED ORDER — PANTOPRAZOLE SODIUM 40 MG IV SOLR
40.0000 mg | INTRAVENOUS | Status: DC
  Administered 2024-03-02 – 2024-03-12 (×11): 40 mg via INTRAVENOUS
  Filled 2024-03-01 (×11): qty 10

## 2024-03-01 MED ORDER — MIDAZOLAM HCL 5 MG/5ML IJ SOLN
INTRAMUSCULAR | Status: DC | PRN
  Administered 2024-03-01: 1 mg via INTRAVENOUS

## 2024-03-01 MED ORDER — BUPIVACAINE HCL (PF) 0.25 % IJ SOLN
INTRAMUSCULAR | Status: AC
Start: 2024-03-01 — End: 2024-03-01
  Filled 2024-03-01: qty 30

## 2024-03-01 MED ORDER — PIPERACILLIN-TAZOBACTAM 3.375 G IVPB
3.3750 g | Freq: Three times a day (TID) | INTRAVENOUS | Status: DC
  Administered 2024-03-01 – 2024-03-10 (×27): 3.375 g via INTRAVENOUS
  Filled 2024-03-01 (×26): qty 50

## 2024-03-01 MED ORDER — PROPOFOL 1000 MG/100ML IV EMUL
0.0000 ug/kg/min | INTRAVENOUS | Status: DC
  Administered 2024-03-01: 55 ug/kg/min via INTRAVENOUS
  Administered 2024-03-02: 5 ug/kg/min via INTRAVENOUS
  Administered 2024-03-02: 10 ug/kg/min via INTRAVENOUS
  Administered 2024-03-02: 5 ug/kg/min via INTRAVENOUS
  Filled 2024-03-01 (×3): qty 100

## 2024-03-01 MED ORDER — SODIUM CHLORIDE 0.9 % IV BOLUS
500.0000 mL | Freq: Once | INTRAVENOUS | Status: AC
  Administered 2024-03-01: 500 mL via INTRAVENOUS

## 2024-03-01 MED ORDER — FENTANYL 2500MCG IN NS 250ML (10MCG/ML) PREMIX INFUSION
0.0000 ug/h | INTRAVENOUS | Status: DC
  Administered 2024-03-01: 25 ug/h via INTRAVENOUS
  Administered 2024-03-03: 50 ug/h via INTRAVENOUS
  Filled 2024-03-01 (×2): qty 250

## 2024-03-01 MED ORDER — FENTANYL CITRATE (PF) 100 MCG/2ML IJ SOLN
INTRAMUSCULAR | Status: DC | PRN
  Administered 2024-03-01: 25 ug via INTRAVENOUS
  Administered 2024-03-01 (×2): 50 ug via INTRAVENOUS
  Administered 2024-03-01: 100 ug via INTRAVENOUS
  Administered 2024-03-01: 25 ug via INTRAVENOUS

## 2024-03-01 MED ORDER — INSULIN ASPART 100 UNIT/ML IJ SOLN
0.0000 [IU] | INTRAMUSCULAR | Status: DC
  Administered 2024-03-01 – 2024-03-07 (×5): 2 [IU] via SUBCUTANEOUS
  Administered 2024-03-07: 3 [IU] via SUBCUTANEOUS
  Administered 2024-03-07 – 2024-03-09 (×11): 2 [IU] via SUBCUTANEOUS
  Administered 2024-03-09 – 2024-03-10 (×4): 3 [IU] via SUBCUTANEOUS
  Administered 2024-03-10: 2 [IU] via SUBCUTANEOUS
  Administered 2024-03-10: 3 [IU] via SUBCUTANEOUS
  Administered 2024-03-10 (×2): 2 [IU] via SUBCUTANEOUS
  Administered 2024-03-11 (×7): 3 [IU] via SUBCUTANEOUS
  Administered 2024-03-12: 5 [IU] via SUBCUTANEOUS
  Administered 2024-03-12: 3 [IU] via SUBCUTANEOUS
  Administered 2024-03-12 (×2): 5 [IU] via SUBCUTANEOUS
  Administered 2024-03-12: 3 [IU] via SUBCUTANEOUS
  Administered 2024-03-13 (×2): 8 [IU] via SUBCUTANEOUS
  Administered 2024-03-13 (×2): 5 [IU] via SUBCUTANEOUS
  Administered 2024-03-13: 3 [IU] via SUBCUTANEOUS
  Administered 2024-03-13: 5 [IU] via SUBCUTANEOUS
  Administered 2024-03-14 (×2): 3 [IU] via SUBCUTANEOUS
  Administered 2024-03-14: 5 [IU] via SUBCUTANEOUS
  Administered 2024-03-14: 8 [IU] via SUBCUTANEOUS
  Administered 2024-03-14: 5 [IU] via SUBCUTANEOUS
  Administered 2024-03-14 – 2024-03-15 (×2): 3 [IU] via SUBCUTANEOUS
  Administered 2024-03-15: 5 [IU] via SUBCUTANEOUS
  Administered 2024-03-15: 3 [IU] via SUBCUTANEOUS
  Administered 2024-03-15: 5 [IU] via SUBCUTANEOUS
  Administered 2024-03-15: 3 [IU] via SUBCUTANEOUS
  Administered 2024-03-15: 5 [IU] via SUBCUTANEOUS
  Administered 2024-03-16: 3 [IU] via SUBCUTANEOUS
  Administered 2024-03-16: 2 [IU] via SUBCUTANEOUS
  Administered 2024-03-16 (×2): 5 [IU] via SUBCUTANEOUS
  Administered 2024-03-16 (×2): 2 [IU] via SUBCUTANEOUS
  Administered 2024-03-17 (×2): 3 [IU] via SUBCUTANEOUS
  Administered 2024-03-17 (×2): 5 [IU] via SUBCUTANEOUS
  Administered 2024-03-17 (×2): 3 [IU] via SUBCUTANEOUS
  Administered 2024-03-18 (×2): 2 [IU] via SUBCUTANEOUS
  Administered 2024-03-18: 3 [IU] via SUBCUTANEOUS

## 2024-03-01 MED ORDER — CHLORHEXIDINE GLUCONATE 0.12 % MT SOLN
15.0000 mL | Freq: Once | OROMUCOSAL | Status: AC
  Administered 2024-03-01: 15 mL via OROMUCOSAL

## 2024-03-01 MED ORDER — PROPOFOL 10 MG/ML IV BOLUS
INTRAVENOUS | Status: DC | PRN
  Administered 2024-03-01: 100 mg via INTRAVENOUS

## 2024-03-01 MED ORDER — VASOPRESSIN 20 UNIT/ML IV SOLN
INTRAVENOUS | Status: DC | PRN
  Administered 2024-03-01 (×3): 1 [IU] via INTRAVENOUS
  Administered 2024-03-01: 2 [IU] via INTRAVENOUS
  Administered 2024-03-01: 1 [IU] via INTRAVENOUS

## 2024-03-01 MED ORDER — ALBUMIN HUMAN 5 % IV SOLN: INTRAVENOUS | Status: DC | PRN

## 2024-03-01 MED ORDER — ORAL CARE MOUTH RINSE: 15.0000 mL | Freq: Once | OROMUCOSAL | Status: AC

## 2024-03-01 MED ORDER — ALBUMIN HUMAN 5 % IV SOLN
INTRAVENOUS | Status: AC
  Filled 2024-03-01: qty 500

## 2024-03-01 MED ORDER — HYDROMORPHONE HCL 1 MG/ML IJ SOLN
INTRAMUSCULAR | Status: DC | PRN
  Administered 2024-03-01: .4 mg via INTRAVENOUS
  Administered 2024-03-01: .2 mg via INTRAVENOUS
  Administered 2024-03-01 (×2): .4 mg via INTRAVENOUS
  Administered 2024-03-01: .6 mg via INTRAVENOUS

## 2024-03-01 MED ORDER — BUPIVACAINE LIPOSOME 1.3 % IJ SUSP
INTRAMUSCULAR | Status: AC
  Filled 2024-03-01: qty 20

## 2024-03-01 MED ORDER — HEPARIN SODIUM (PORCINE) 5000 UNIT/ML IJ SOLN
5000.0000 [IU] | Freq: Three times a day (TID) | INTRAMUSCULAR | Status: DC
  Administered 2024-03-02 – 2024-03-03 (×4): 5000 [IU] via SUBCUTANEOUS
  Filled 2024-03-01 (×4): qty 1

## 2024-03-01 MED ORDER — OXYCODONE HCL 5 MG PO TABS
5.0000 mg | ORAL_TABLET | Freq: Four times a day (QID) | ORAL | Status: DC | PRN
  Administered 2024-03-01: 5 mg via ORAL
  Filled 2024-03-01: qty 1

## 2024-03-01 SURGICAL SUPPLY — 73 items
ATTRACTOMAT 16X20 MAGNETIC DRP (DRAPES) IMPLANT
BAG COUNTER SPONGE SURGICOUNT (BAG) IMPLANT
BLADE EXTENDED COATED 6.5IN (ELECTRODE) ×2 IMPLANT
CELLS DAT CNTRL 66122 CELL SVR (MISCELLANEOUS) IMPLANT
CHLORAPREP W/TINT 26 (MISCELLANEOUS) ×2 IMPLANT
CLIP TI LARGE 6 (CLIP) ×2 IMPLANT
CLIP TI MEDIUM 6 (CLIP) ×2 IMPLANT
CLIP TI MEDIUM LARGE 6 (CLIP) ×2 IMPLANT
CNTNR URN SCR LID CUP LEK RST (MISCELLANEOUS) IMPLANT
COVER MAYO STAND STRL (DRAPES) IMPLANT
COVER SURGICAL LIGHT HANDLE (MISCELLANEOUS) ×2 IMPLANT
CURETTE PIPELLE ENDOMTRL SUCTN (MISCELLANEOUS) IMPLANT
DERMABOND ADVANCED .7 DNX12 (GAUZE/BANDAGES/DRESSINGS) IMPLANT
DRAIN CHANNEL 10M FLAT 3/4 FLT (DRAIN) IMPLANT
DRAPE DERMATAC (DRAPES) IMPLANT
DRAPE INCISE IOBAN 66X45 STRL (DRAPES) IMPLANT
DRAPE SURG IRRIG POUCH 19X23 (DRAPES) ×2 IMPLANT
DRAPE WARM FLUID 44X44 (DRAPES) ×2 IMPLANT
DRSG OPSITE POSTOP 4X10 (GAUZE/BANDAGES/DRESSINGS) IMPLANT
DRSG OPSITE POSTOP 4X6 (GAUZE/BANDAGES/DRESSINGS) IMPLANT
DRSG OPSITE POSTOP 4X8 (GAUZE/BANDAGES/DRESSINGS) IMPLANT
ELECT REM PT RETURN 15FT ADLT (MISCELLANEOUS) ×2 IMPLANT
EVACUATOR SILICONE 100CC (DRAIN) IMPLANT
GAUZE 4X4 16PLY ~~LOC~~+RFID DBL (SPONGE) IMPLANT
GLOVE BIO SURGEON STRL SZ 6.5 (GLOVE) ×2 IMPLANT
GLOVE BIOGEL PI IND STRL 6.5 (GLOVE) ×2 IMPLANT
GLOVE BIOGEL PI MICRO STRL 6 (GLOVE) ×8 IMPLANT
GOWN STRL REUS W/ TWL LRG LVL3 (GOWN DISPOSABLE) ×4 IMPLANT
HEMOSTAT ARISTA ABSORB 3G PWDR (HEMOSTASIS) IMPLANT
KIT BASIN OR (CUSTOM PROCEDURE TRAY) ×2 IMPLANT
KIT TURNOVER KIT A (KITS) ×2 IMPLANT
LIGASURE IMPACT 36 18CM CVD LR (INSTRUMENTS) IMPLANT
LOOP VESSEL MAXI BLUE (MISCELLANEOUS) IMPLANT
NDL HYPO 21X1.5 SAFETY (NEEDLE) ×4 IMPLANT
NDL SPNL 18GX3.5 QUINCKE PK (NEEDLE) IMPLANT
NEEDLE HYPO 21X1.5 SAFETY (NEEDLE) ×4 IMPLANT
NEEDLE SPNL 18GX3.5 QUINCKE PK (NEEDLE) ×2 IMPLANT
NS IRRIG 1000ML POUR BTL (IV SOLUTION) ×4 IMPLANT
PACK GENERAL/GYN (CUSTOM PROCEDURE TRAY) ×2 IMPLANT
RELOAD PROXIMATE 100 BLUE (MISCELLANEOUS) ×2 IMPLANT
RELOAD PROXIMATE 75MM BLUE (ENDOMECHANICALS) IMPLANT
RELOAD PROXIMATE TA60MM BLUE (ENDOMECHANICALS) IMPLANT
RELOAD STAPLE 100 3.8 BLU REG (MISCELLANEOUS) IMPLANT
RELOAD STAPLE 60 BLU REG PROX (ENDOMECHANICALS) IMPLANT
RELOAD STAPLE 75 3.8 BLU REG (ENDOMECHANICALS) IMPLANT
RETRACTOR WND ALEXIS 18 MED (MISCELLANEOUS) IMPLANT
RETRACTOR WND ALEXIS 25 LRG (MISCELLANEOUS) IMPLANT
SHEET LAVH (DRAPES) ×2 IMPLANT
SLEEVE SUCTION CATH 165 (SLEEVE) ×2 IMPLANT
SOL PREP POV-IOD 4OZ 10% (MISCELLANEOUS) ×2 IMPLANT
SPONGE DRAIN TRACH 4X4 STRL 2S (GAUZE/BANDAGES/DRESSINGS) IMPLANT
STAPLER GUN LINEAR PROX 60 (STAPLE) IMPLANT
STAPLER PROXIMATE 100MM BLUE (MISCELLANEOUS) IMPLANT
STAPLER PROXIMATE 75MM BLUE (STAPLE) IMPLANT
STAPLER SKIN PROX 35W (STAPLE) IMPLANT
SURGIFLO W/THROMBIN 8M KIT (HEMOSTASIS) IMPLANT
SUT ETHILON 3 0 PS 1 (SUTURE) IMPLANT
SUT MNCRL AB 4-0 PS2 18 (SUTURE) ×4 IMPLANT
SUT PDS AB 1 TP1 54 (SUTURE) ×4 IMPLANT
SUT SILK 3 0 SH CR/8 (SUTURE) IMPLANT
SUT VIC AB 0 CT1 36 (SUTURE) ×8 IMPLANT
SUT VIC AB 2-0 CT1 36 (SUTURE) ×4 IMPLANT
SUT VIC AB 2-0 CT1 TAPERPNT 27 (SUTURE) ×4 IMPLANT
SUT VIC AB 2-0 CT2 27 (SUTURE) IMPLANT
SUT VIC AB 2-0 SH 18 (SUTURE) IMPLANT
SUT VIC AB 2-0 SH 27X BRD (SUTURE) IMPLANT
SUT VIC AB 3-0 CTX 36 (SUTURE) IMPLANT
SUT VIC AB 3-0 SH 18 (SUTURE) IMPLANT
SUT VIC AB 3-0 SH 27X BRD (SUTURE) ×2 IMPLANT
SYR 30ML LL (SYRINGE) ×4 IMPLANT
TOWEL OR 17X26 10 PK STRL BLUE (TOWEL DISPOSABLE) ×2 IMPLANT
TRAY FOLEY MTR SLVR 16FR STAT (SET/KITS/TRAYS/PACK) ×2 IMPLANT
UNDERPAD 30X36 HEAVY ABSORB (UNDERPADS AND DIAPERS) ×2 IMPLANT

## 2024-03-01 NOTE — Progress Notes (Signed)
 PT Cancellation Note  Patient Details Name: Alexandria Durham MRN: [id number] DOB: 05-22-1946   Cancelled Treatment:    Reason Eval/Treat Not Completed: Patient at procedure or test/unavailable   [facility] 03/01/2024, 12:22 PM

## 2024-03-01 NOTE — Progress Notes (Incomplete)
 Attending note: I have seen and examined the patient. History, labs and imaging reviewed.  78 year old with history of hypertension, diabetes, hyperlipidemia, hypothyroidism, GERD, CKD, recent multiple hospitalizations for acute pancreatitis with acute cholecystitis status post laparoscopic cholecystectomy, acute diverticulitis of descending colon with possible microperforations treated conservatively with IV fluids and antibiotics.  Admitted for hysteroscopy, perforated uterus and bowel perforation.  Underwent exploratory laparotomy with colostomy placed.  In the ICU on the ventilator and Neo-Synephrine.  PCCM consulted for help with management  Blood pressure 112/73, pulse 97, temperature 97.8 F (36.6 C), temperature source Oral, resp. rate (!) 24, height 5' 4 (1.626 m), weight 72.6 kg, SpO2 100%. Gen:      No acute distress HEENT:  EOMI, sclera anicteric, ET tube Neck:     No masses; no thyromegaly Lungs:    Clear to auscultation bilaterally; normal respiratory effort CV:         Regular rate and rhythm; no murmurs Abd:      Ostomy bag, JP drains Ext:    No edema; adequate peripheral perfusion Neuro: Sedated, unresponsive  Labs/Imaging personally reviewed, significant for Sodium 133, potassium 3.4, BUN/creatinine 16/1.59 WBC 2.4, hemoglobin 9.7, platelets 136  Assessment/plan: Septic shock secondary to peritonitis, bowel perforation Status post ex lap with ostomy placement Continue Zosyn, wean pressors as tolerated.   Follow blood cultures, lactic acid  Vent dependence, postop Keep intubated overnight until she stabilizes from surgery ABG, chest x-ray  AKI on CKD Monitor urine output and creatinine  Diabetes, hypothyroidism SSI coverage Continue synthyroid  The patient is critically ill with multiple organ systems failure and requires high complexity decision making for assessment and support, frequent evaluation and titration of therapies, application of advanced monitoring  technologies and extensive interpretation of multiple databases.  Critical care time - 35 mins. This represents my time independent of the NPs time taking care of the pt.  Joshua Zeringue MD [facility] 03/01/2024, 6:29 PM

## 2024-03-01 NOTE — Brief Op Note (Signed)
 02/29/2024 - 03/01/2024  6:00 PM  PATIENT:  Alexandria Durham  78 y.o. female  PRE-OPERATIVE DIAGNOSIS:  Suspected bowel perforation  POST-OPERATIVE DIAGNOSIS:  Suspected bowel perforation  PROCEDURE:  Procedure(s) with comments: LAPAROTOMY, EXPLORATORY (N/A) - Possible Ileostomy SIGMOIDOSCOPY COLECTOMY, WITH COLOSTOMY CREATION MOBILIZATION, SPLENIC FLEXURE, WITH PARTIAL COLECTOMY CYSTOSCOPY  SURGEON:  Surgeons and Role: Panel 1:    DEWAINE [name] Mays, MD - Primary    * Rogelio Planas, MD - Assisting    * Debby Hila, MD - Assisting Panel 2:    DEWAINE Debby Hila, MD - Primary  ANESTHESIA:   general  EBL:  100 mL   BLOOD ADMINISTERED: 2u pRBC  DRAINS: Flat drain through the LLQ  SPECIMEN:   ID Type Source Tests Collected by Time Destination  1 : Endometrial biopsy Tissue PATH Other SURGICAL PATHOLOGY [name] Mays, MD 03/01/2024 1544   2 : Portion of colon Tissue PATH GI Other SURGICAL PATHOLOGY [name] Mays, MD 03/01/2024 1703   A : Urine Culture Urine Urine, Catheterized URINE CULTURE [name] Mays, MD 03/01/2024 1757     DISPOSITION OF SPECIMEN:  PATHOLOGY  COUNTS:  YES  TOURNIQUET:  * No tourniquets in log *  DICTATION: .Note written in EPIC  PLAN OF CARE: Admit to inpatient   PATIENT DISPOSITION:  ICU - intubated and hemodynamically stable.   Delay start of Pharmacological VTE agent (>24hrs) due to surgical blood loss or risk of bleeding: no

## 2024-03-01 NOTE — Op Note (Signed)
 GYNECOLOGIC ONCOLOGY OPERATIVE NOTE  Date of Service: 02/29/2024  Preoperative Diagnosis: Suspected bowel perforation  Postoperative Diagnosis: Sigmoid colon bowel perforation  Procedures: Exploratory laparotomy, partial colectomy, descending end colostomy, mobilization of splenic flexure, cystoscopy, endometrial biopsy (Flexible Sigmoidoscopy performed by Dr. Debby).   Surgeon: Hoy Masters, MD  Assistants: Olam Mill, MD and (an MD assistant was necessary for tissue manipulation, management of robotic instrumentation, retraction and positioning due to the complexity of the case and hospital policies)  Anesthesia: General  Estimated Blood Loss: 100 mL   Fluids: 5000 ml, crystalloid; 2u pRBC  Urine Output: 100 ml, pink-tinged urine  Findings: On laparotomy, opaque maroon fluid and serosanguineous fluid in abdomen with feculent odor without evidence of stool or succus.  Inflammatory surface of distal small bowel and rectosigmoid colon.  Small bowel run and found to be intact without injury.  Rectosigmoid colon inspected and noted to have defect of mesentery to the left of the sigmoid colon and ultimately identified full-thickness injury to the proximal sigmoid colon.  Uterus with defect in anterior lower uterine segment consistent with site of uterine perforation.  Uterus otherwise globally mildly enlarged.  Pink-tinged urine noted at beginning of procedure and throughout procedure.  Cystoscopy performed with no injury to the bladder but overall injected mucosa.  Bladder diverticula noted. Urine culture collected.  Strong ureteral jets from bilateral ureteral orifices.  Specimens:  ID Type Source Tests Collected by Time Destination  1 : Endometrial biopsy Tissue PATH Other SURGICAL PATHOLOGY Masters Hoy, MD 03/01/2024 1544   2 : Portion of colon Tissue PATH GI Other SURGICAL PATHOLOGY Masters Hoy, MD 03/01/2024 1703   A : Urine Culture Urine Urine, Catheterized URINE  CULTURE Masters Hoy, MD 03/01/2024 1757     Complications:  None  Indications for Procedure: Alexandria Durham is a 78 y.o. woman who is postoperative day 1 from a hysteroscopy MyoSure with uterine perforation and concern for possible bowel injury, status post diagnostic laparoscopy yesterday without clear evidence of bowel injury at that time who was found on pathology to have a full-thickness injury to her bowel.  Now with vital sign changes concerning for sepsis from bowel perforation.  Prior to the procedure, all risks, benefits, and alternatives were discussed and informed surgical consent was signed.  Procedure: Patient was taken to the operating room where general anesthesia was achieved. Pelvic drain was removed. She was positioned in dorsal lithotomy and prepped and draped.  A foley catheter was already in the bladder.  A vertical midline incision was made with the scalpel and the abdomen was entered sharply.  The abdomen and pelvis were surveyed with findings as documented above. A wound protector was placed followed by the Bookwalter retractor.   Fluid was drained from the abdomen and the abdomen was copiously irrigated.  At time of entry, no clear feculent or succus material in the abdomen but the room opaque fluid with feculent odor.  First, the small bowel was run in its entirety.  Few adhesions of the small bowel in the pelvis were lysed sharply with Metzenbaum scissors.  No injury was noted along the small bowel.  Next the rectosigmoid colon was inspected.  Inflammatory rind present on portions of the rectosigmoid colon.  Initially location of injury not clearly identified.  Prior area of mesentery defect again identified without bowel injury at that area.  Dr. Debby presented perform a flexible sigmoidoscopy.  Please see separate operative note.  During this process, the proximal sigmoid colon was occluded bluntly  to aid in insufflation to view colon.  In doing so, a small amount of stool  was noted to protrude from the proximal sigmoid colon, thus identifying the location of bowel injury.  Due to stool in the distal colon, the full rectosigmoid colon could not be inspected on flexible sigmoidoscopy but no additional areas of concern identified.  Given the appearance of the health of the bowel wall at the area of bowel injury, decision made to resect this portion of colon and proceed with the descending end colostomy with plan for reversal in the future.  2-0 Vicryl was used to oversew the bowel wall for temporary closure.  The rectosigmoid colon was mobilized and elevated.  A small window was made in the mesentery and the rectosigmoid was transected with GIA stapler at the level of the sacral promontory, distal to the colonic injury site. The mesentery was transected with the Ligasure to allow mobilization of the colon.  2 stitches of silk were placed on the staple line of the distal colon stump to aid in identification at reanastomosis in the future.  The peritoneum overlying the left pelvic sidewall was grasped and elevated.  The peritoneum was incised with electrocautery and the retroperitoneum entered.  The left ureter was identified.  The peritoneal incision was extended cephalad along the white line of Toldt.  This was extended to the splenic flexure and the descending colon was mobilized to perform the descending end colostomy  At this time, attention was returned to the pelvis.  Attempt was made to drain the fluid from the endometrial cavity by using a spinal needle through the uterine fundus to send for cytology given that we have yet to obtain pathologic evaluation of the endometrial cavity.  With aspiration no fluid return.  Spinal needle was removed.  The area at the fundus was oversewn with a figure-of-eight stitch of 3-0 Vicryl to obtain hemostasis.  An additional figure-of-eight stitch was placed near the area of prior uterine perforation to aid in hemostasis.  A speculum was  then placed in the vagina.  A tenaculum was placed on the posterior lip of the cervix.  While simultaneously palpating the uterus, an endometrial Pipelle was inserted.  There was initial concern for possible posterior deviation of the endometrial Pipelle into the subserosa without perforation occurring.  The Pipelle was redirected and felt to more likely be within the uterine cavity, sounding to 7.5 cm.  Multiple passes were then obtained to attempt to obtain an endometrial sampling.  The tenaculum was removed with hemostasis noted at the cervix.  The speculum was removed.  Next, the indwelling Foley catheter was removed.  A cystoscope was inserted into the bladder.  The bladder was examined with no evidence of injury.  Efflux was noted from bilateral ureteral orifices.  A new Foley catheter was then placed in the bladder.  Urine culture was obtained given the concern for hematuria without evidence of bladder injury.  Returning to the abdomen, a circumferential area on the left upper abdomen was elevated and an incision was made. A portion of adipose tissue was excised. A cruciate incision was made on the fascia.  The colon was brought through the fascia and subcutaneous tissues.  The colon was noted to be in correct orientation and free of tension.  Using a GIA stapler, the colon was stapled proximal to the bowel defect, and the portion of colon with bowel defect was handed off the field for pathology.  The abdomen was again copiously irrigated.  All operative sites were found to be hemostatic.  A new flat drain was placed in the pelvis and the posterior cul-de-sac, exiting through the left lower quadrant and the prior drain site incision.  The fascia was closed with two running stitches of #1 PDS, tied separately in the midline. The subcutaneous tissues were irrigated and hemostasis achieved.  The midline incision was then window framed with wound VAC dressing and a wound VAC placed.  This was placed to  vacuum suction with good seal.  The left upper quadrant laparoscopic incision from yesterday's procedure was noted to superficially open.  A new subcuticular stitch of 4-0 Monocryl was placed and then covered with surgical glue.  Attention was then turned to the ostomy.  The staple line was excised with Metzenbaum scissors.  The lumen was digitally palpated and noted to be patent past the fascia.  4 stitches were placed with 2-0 Vicryl to secure the bowel wall to the fascia.  Next brooking stitches were placed with 3-0 Vicryl in all 4 quadrants.  Additional rosebud stitches were placed with 0 Vicryl to secure the remainder of the ostomy to the skin.  The ostomy appliance was then placed.  Patient tolerated the procedure well. Sponge, lap, and instrument counts were correct.  Patient had received Zosyn  prior to the procedure for sepsis from bowel perforation.  Antibiotics were continued.  Given low urine output and continued acidosis on ABG, decision made for patient to stay intubated and proceed to the ICU.   Hoy Masters, MD Gynecologic Oncology

## 2024-03-01 NOTE — Consult Note (Signed)
 NAME:  Alexandria Durham, MRN:  [id number], DOB:  May 23, 1946, LOS: 1 ADMISSION DATE:  02/29/2024, CONSULTATION DATE:  03/01/24 REFERRING MD:  [name], CHIEF COMPLAINT:  post-op ventilator management    History of Present Illness:  Alexandria Durham is a 78 y.o.  F with PMH significant for thickened endometrium, HTN, Type 2 DM, CKD stage IIIa, recent pancreatitis and acute cholecystitis s/p lap chole and D&C in May of this year who was admitted 7/1 for hysteroscopy and repeat D&C.  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 Pt was acidotic post-procedure, so decision made to leave intubated, PCCM consulted in this setting.   Pertinent  Medical History   has a past medical history of Arthritis, Chronic kidney disease, COPD (chronic obstructive pulmonary disease) (HCC), Diabetes mellitus, Family history of adverse reaction to anesthesia, GERD (gastroesophageal reflux disease), Heart murmur, Hypercholesteremia, Hypertension, and Hypothyroidism.   Significant Hospital Events: Including procedures, antibiotic start and stop dates in addition to other pertinent events   7/2 admit to ICU post-op, ventilated   Interim History / Subjective:  Pt presented to the ICU on Neo 40mcg sedated and intubated   Objective    Blood pressure 112/73, pulse 97, temperature 97.8 F (36.6 C), temperature source Oral, resp. rate (!) 24, height 5' 4 (1.626 m), weight 72.6 kg, SpO2 100%.    Vent Mode: PRVC FiO2 (%):  [100 %] 100 % Set Rate:  [24 bmp] 24 bmp Vt Set:  [430 mL] 430 mL PEEP:  [5 cmH20] 5 cmH20 Plateau Pressure:  [18 cmH20] 18 cmH20   Intake/Output Summary (Last 24 hours) at 03/01/2024 1831 Last data filed at 03/01/2024 1823 Gross per 24 hour  Intake 7220 ml  Output 825 ml  Net 6395 ml   Filed Weights   02/29/24 0553 03/01/24 1127 03/01/24 1823  Weight: 64.4 kg 64  kg 72.6 kg    General:  well nourished elderly F, intubated and sedated HEENT: MM pink/moist, ETT in place, sclera anicterc Neuro: examined on propofol , RASS-2 CV: s1s2 rrr, no m/r/g PULM:  clear bilaterally on mechanical ventilation, synchronous with vent  GI: soft, ileostomy, JP drain and midline incision clean and dry Extremities: warm/dry, no edema     Resolved problem list   Assessment and Plan    Post-op ventilator management  -Propofol  and Fentanyl  for PAD protocol -ABG, CXR  pending --Maintain full vent support with SAT/SBT as tolerated -titrate Vent setting to maintain SpO2 greater than or equal to 90%. -HOB elevated 30 degrees. -Plateau pressures less than 30 cm H20.  -Follow chest x-ray, ABG prn.   -Bronchial hygiene and RT/bronchodilator protocol.   S/p partial colectomy and ileostomy  Septic shock  -management per primary team  -continue zosyn -blood cultures -check lactic acid -neosynephrine to maintain MAP >65   Hypokalemia  CKD  -trend and replete electrolytes prn -monitor renal function and UOP, avoid nephrotoxins     Type 2 DM  -SSI      Best Practice (right click and Reselect all SmartList Selections daily)   Diet/type: NPO DVT prophylaxis SCD Pressure ulcer(s): N/A GI prophylaxis: PPI Lines: Central line Foley:  Yes, and it is still needed Code Status:  full code Last date of multidisciplinary goals of care discussion [per primary]  Labs   CBC: Recent Labs  Lab 03/01/24 1056  WBC 2.4*  HGB 11.7*  HCT 35.3*  MCV 92.7  PLT 136*    Basic Metabolic Panel: Recent Labs  Lab 03/01/24 1056  NA 133*  K 3.4*  CL 101  CO2 17*  GLUCOSE 146*  BUN 16  CREATININE 1.59*  CALCIUM  8.7*   GFR: Estimated Creatinine Clearance: 29.4 mL/min (A) (by C-G formula based on SCr of 1.59 mg/dL (H)). Recent Labs  Lab 03/01/24 1056  WBC 2.4*    Liver Function Tests: No results for input(s): AST, ALT, ALKPHOS, BILITOT,  PROT, ALBUMIN in the last 168 hours. No results for input(s): LIPASE, AMYLASE in the last 168 hours. No results for input(s): AMMONIA in the last 168 hours.  ABG No results found for: PHART, PCO2ART, PO2ART, HCO3, TCO2, ACIDBASEDEF, O2SAT   Coagulation Profile: No results for input(s): INR, PROTIME in the last 168 hours.  Cardiac Enzymes: No results for input(s): CKTOTAL, CKMB, CKMBINDEX, TROPONINI in the last 168 hours.  HbA1C: Hgb A1c MFr Bld  Date/Time Value Ref Range Status  01/03/2024 04:34 AM 6.8 (H) 4.8 - 5.6 % Final    Comment:    (NOTE) Pre diabetes:          5.7%-6.4%  Diabetes:              >6.4%  Glycemic control for   <7.0% adults with diabetes     CBG: Recent Labs  Lab 02/29/24 0606 02/29/24 1047 02/29/24 1612 02/29/24 2133 03/01/24 0800  GLUCAP 86 152* 161* 154* 158*    Review of Systems:   Unable to obtain  Past Medical History:  She,  has a past medical history of Arthritis, Chronic kidney disease, COPD (chronic obstructive pulmonary disease) (HCC), Diabetes mellitus, Family history of adverse reaction to anesthesia, GERD (gastroesophageal reflux disease), Heart murmur, Hypercholesteremia, Hypertension, and Hypothyroidism.   Surgical History:   Past Surgical History:  Procedure Laterality Date   BREAST CYST EXCISION Left    benign cyst   CHOLECYSTECTOMY N/A 01/17/2024   Procedure: LAPAROSCOPIC CHOLECYSTECTOMY WITH INTRAOPERATIVE CHOLANGIOGRAM;  Surgeon: Sheldon Standing, MD;  Location: [facility];  Service: General;  Laterality: N/A;   DILATION AND CURETTAGE OF UTERUS N/A 01/17/2024   Procedure: DILATION AND CURETTAGE;  Surgeon: [name] Mays, MD;  Location: [facility];  Service: Gynecology;  Laterality: N/A;   EYE SURGERY Left    cataract removal   HEMORRHOID SURGERY     HIP SURGERY     HYSTEROSCOPY WITH D & C N/A 01/06/2024   Procedure: DILATATION AND CURETTAGE /HYSTEROSCOPY;  Surgeon: Cleatus Moccasin, MD;   Location: [facility];  Service: Gynecology;  Laterality: N/A;  PAP SMEAR (DR TO BRING SUPPLIES)  TUCKERS CARD   HYSTEROSCOPY WITH D & C N/A 02/29/2024   Procedure: HYSTEROSCOPY WITH MYOSURE;  Surgeon: [name] Mays, MD;  Location: [facility];  Service: Gynecology;  Laterality: N/A;  myosure, possible ultrasound guidance   JOINT REPLACEMENT     hip replacement   LAPAROSCOPIC LYSIS OF ADHESIONS  01/06/2024   Procedure: LYSIS, ADHESIONS, LAPAROSCOPIC;  Surgeon: Cleatus Moccasin, MD;  Location: [facility];  Service: Gynecology;;   LAPAROSCOPY N/A 01/06/2024   Procedure: LAPAROSCOPY, DIAGNOSTIC;  Surgeon: Cleatus Moccasin, MD;  Location: [facility];  Service: Gynecology;  Laterality: N/A;   LAPAROSCOPY N/A 02/29/2024   Procedure: LAPAROSCOPY, DIAGNOSTIC;  Surgeon: [name] Mays, MD;  Location: [facility];  Service: Gynecology;  Laterality: N/A;   OPERATIVE ULTRASOUND N/A 02/29/2024   Procedure: US  INTRAOPERATIVE;  Surgeon: [name] Mays, MD;  Location: [facility];  Service: Gynecology;  Laterality: N/A;   SIGMOIDOSCOPY N/A 02/29/2024   Procedure: SIGMOIDOSCOPY;  Surgeon: Debby Hila, MD;  Location: [facility];  Service: General;  Laterality: N/A;  Rigid sigmoidoscopy   THYROID  SURGERY     TONSILLECTOMY     TUBAL LIGATION       Social History:   reports that she has quit smoking. Her smoking use included cigarettes. She started smoking about 58 years ago. She has never been exposed to tobacco smoke. She has never used smokeless tobacco. She reports that she does not drink alcohol and does not use drugs.   Family History:  Her family history is negative for Breast cancer, Prostate cancer, Pancreatic cancer, Colon cancer, Endometrial cancer, and Ovarian cancer.   Allergies Allergies  Allergen Reactions   Penicillins Nausea And Vomiting, Palpitations, Dermatitis, Rash and Other (See Comments)    Patient reported an elevated heart rate   Ace Inhibitors Hives and Itching   Tape Other (See Comments)    Skin is sensitive    Aspirin Palpitations   Codeine Palpitations     Home Medications  Prior to Admission medications   Medication Sig Start Date End Date Taking? Authorizing Provider  bisacodyl  5 MG EC tablet Take 5 mg by mouth daily as needed for moderate constipation.   Yes [provider]  buPROPion  (WELLBUTRIN  XL) 150 MG 24 hr tablet Take 150 mg by mouth every evening.   Yes [provider]  Cholecalciferol  (VITAMIN D3) 25 MCG (1000 UT) CAPS Take 1,000 Units by mouth daily.   Yes [provider]  Cyanocobalamin  (VITAMIN B-12 PO) Take 1 tablet by mouth in the morning.   Yes [provider]  diphenhydramine -acetaminophen  (TYLENOL  PM) 25-500 MG TABS tablet Take 2 tablets by mouth at bedtime as needed (Sleep/Pain).   Yes [provider]  ezetimibe  (ZETIA ) 10 MG tablet Take 10 mg by mouth daily. 10/29/23 10/28/24 Yes [provider]  ferrous sulfate 325 (65 FE) MG tablet Take 325 mg by mouth daily with breakfast.   Yes [provider]  fluticasone  (FLONASE ) 50 MCG/ACT nasal spray Place 2 sprays into both nostrils daily as needed for rhinitis. 02/15/24  Yes Cheryle Page, MD  hydrochlorothiazide  (HYDRODIURIL ) 25 MG tablet Take 1 tablet (25 mg total) by mouth daily. 01/10/24  Yes Barbarann Nest, MD  levothyroxine  (SYNTHROID ) 75 MCG tablet Take 75 mcg by mouth daily before breakfast. 09/22/22  Yes [provider]  losartan  (COZAAR ) 50 MG tablet Take 50 mg by mouth in the morning.   Yes [provider]  magnesium  oxide (MAG-OX) 400 MG tablet Take 400 mg by mouth daily. 03/01/17  Yes [provider]  methocarbamol  (ROBAXIN ) 500 MG tablet Take 1 tablet (500 mg total) by mouth 2 (two) times daily as needed for muscle spasms. 05/25/21  Yes Joldersma, Logan, PA-C  mometasone (ELOCON) 0.1 % cream Apply 1 Application topically daily as needed (Dermatitis- affected sites). 10/27/23  Yes [provider]  omeprazole (PRILOSEC) 40 MG  capsule Take 40 mg by mouth daily before breakfast.   Yes [provider]  polycarbophil (FIBERCON) 625 MG tablet Take 1 tablet (625 mg total) by mouth 2 (two) times daily. 01/09/24  Yes Barbarann Nest, MD  polyethylene glycol powder (GLYCOLAX /MIRALAX ) 17 GM/SCOOP powder Take 17 g by mouth 2 (two) times daily as needed for moderate constipation. 08/15/15  Yes [provider]  sitaGLIPtin (JANUVIA) 50 MG tablet Take 50 mg by mouth daily. 10/28/22  Yes [provider]  triamcinolone ointment (  KENALOG) 0.5 % Apply 1 Application topically 2 (two) times daily as needed (for itching).   Yes [provider]  TYLENOL  8 HOUR ARTHRITIS PAIN 650 MG CR tablet Take 650-1,300 mg by mouth every 8 (eight) hours as needed for pain.   Yes [provider]  ondansetron  (ZOFRAN ) 4 MG tablet Take 1 tablet (4 mg total) by mouth every 6 (six) hours as needed for nausea. 02/15/24   Cheryle Page, MD     Critical care time:   35 minutes      CRITICAL CARE Performed by: Leita SAUNDERS Welden Hausmann   Total critical care time: 35 minutes  Critical care time was exclusive of separately billable procedures and treating other patients.  Critical care was necessary to treat or prevent imminent or life-threatening deterioration.  Critical care was time spent personally by me on the following activities: development of treatment plan with patient and/or surrogate as well as nursing, discussions with consultants, evaluation of patient's response to treatment, examination of patient, obtaining history from patient or surrogate, ordering and performing treatments and interventions, ordering and review of laboratory studies, ordering and review of radiographic studies, pulse oximetry and re-evaluation of patient's condition.   Leita SAUNDERS Kassey Laforest, PA-C [facility] See Amion for pager If no response to pager , please call [phone] until 7pm After 7:00 pm call Elink   [phone]

## 2024-03-01 NOTE — Plan of Care (Signed)
   Problem: Education: Goal: Knowledge of General Education information will improve Description Including pain rating scale, medication(s)/side effects and non-pharmacologic comfort measures Outcome: Progressing   Problem: Health Behavior/Discharge Planning: Goal: Ability to manage health-related needs will improve Outcome: Progressing

## 2024-03-01 NOTE — Progress Notes (Addendum)
 eLink Physician-Brief Progress Note Patient Name: Alexandria Durham DOB: 1946-07-05 MRN: [id number]   Date of Service  03/01/2024  HPI/Events of Note  78 year old with history of hypertension, diabetes, hyperlipidemia, hypothyroidism, GERD, CKD, recent multiple hospitalizations for acute pancreatitis with acute cholecystitis status post laparoscopic cholecystectomy, acute diverticulitis of descending colon with possible microperforations treated conservatively with IV fluids and antibiotics.   Radiograph and ABG reviewed  eICU Interventions  Titrate down FiO2 as tolerated to maintain SpO2 greater than 88% otherwise maintain current care   2123 -magnesium  0.8 and elevated lactic acid 3.6.  Magnesium  supplementation and recheck of both studies in the morning  Intervention Category Minor Interventions: Routine modifications to care plan (e.g. PRN medications for pain, fever)  Alexandria Durham 03/01/2024, 8:33 PM

## 2024-03-01 NOTE — Progress Notes (Signed)
 Contacted NP Cross about pt B/P which is 96/45 with MAP of 61 and HR 123 she is in surgery at this time and DR Viktoria stated she will contact me as soon as she is available

## 2024-03-01 NOTE — Anesthesia Procedure Notes (Signed)
 Central Venous Catheter Insertion Performed by: Merla Almarie HERO, DO, anesthesiologist Start/End7/10/2023 1:30 PM, 03/01/2024 1:40 PM Patient location: Pre-op. Preanesthetic checklist: patient identified, IV checked, site marked, risks and benefits discussed, surgical consent, monitors and equipment checked, pre-op evaluation, timeout performed and anesthesia consent Lidocaine  1% used for infiltration and patient sedated Hand hygiene performed  and maximum sterile barriers used  Catheter size: 9 Fr Central line was placed.MAC introducer Procedure performed using ultrasound guided technique. Ultrasound Notes:image(s) printed for medical record Attempts: 1 Following insertion, dressing applied, line sutured and Biopatch. Post procedure assessment: blood return through all ports  Patient tolerated the procedure well with no immediate complications.

## 2024-03-01 NOTE — Progress Notes (Addendum)
 Spoke to patient about plan for surgery.  Patient was consented for: exploratory laparotomy, possible bowel surgery, possible ostomy, possible wound vac, possible hysterectomy bilateral salpingo-oophorectomy.  The risks of surgery were discussed in detail and she understands these to including but not limited to bleeding requiring a blood transfusion, infection, injury to adjacent organs (including but not limited to the bowels, bladder, ureters, nerves, blood vessels), wound separation, unforseen complication, and possible need for re-exploration.  If the patient experiences any of these events, she understands that her hospitalization or recovery may be prolonged and that she may need to take additional medications for a prolonged period. The patient will receive DVT and antibiotic prophylaxis as indicated. She voiced a clear understanding. She had the opportunity to ask questions and informed consent was obtained today. She wishes to proceed.  Called pt's daughter, Darrick Pines. Updated her on the clinical situation and plan. All questions answered.

## 2024-03-01 NOTE — Progress Notes (Signed)
 1 Day Post-Op Procedure(s) (LRB): HYSTEROSCOPY WITH MYOSURE (N/A) US  INTRAOPERATIVE (N/A) LAPAROSCOPY, DIAGNOSTIC (N/A) SIGMOIDOSCOPY (N/A)  Subjective: Patient reports pain--mostly LLQ wbelching.  No N/V, SOB or C/P.  Declined AM blood draw.  Objective: Vital signs in last 24 hours: Temp:  [97.5 F (36.4 C)-98.6 F (37 C)] 98.3 F (36.8 C) (07/02 0607) Pulse Rate:  [96-122] 122 (07/02 0607) Resp:  [12-20] 19 (07/02 0607) BP: (115-193)/(62-89) 134/64 (07/02 0607) SpO2:  [93 %-100 %] 98 % (07/02 0607) Last BM Date : 01/25/24  Intake/Output from previous day: 07/01 0701 - 07/02 0700 In: 3515.3 [P.O.:240; I.V.:2675.3; IV Piggyback:600] Out: 1595 [Urine:1305; Drains:90; Blood:200]  Physical Examination: General: alert and mild distress Cardio: tachy, reg rhythm  GI: ND, minimal tenderness; drain w/scant serosanguinous drainage  Labs:       Assessment:  78 y.o. s/p Procedure(s): HYSTEROSCOPY WITH MYOSURE US  INTRAOPERATIVE LAPAROSCOPY, DIAGNOSTIC SIGMOIDOSCOPY Pain:  Pain is not well-controlled on  oral medications.  GI:  Undergoing close monitoring for possible bowel injury.  No obvious stool in drainage fluid.  Tolerating po: Yes--clear liquid diet     Card: Tachycardic.  Likely 2/2 to pain-consider possible intraabdominal process   FEN: Refused labs.  Low urine output   Endo: CBG (last 3)  Recent Labs    02/29/24 1612 02/29/24 2133 03/01/24 0800  GLUCAP 161* 154* 158*     Prophylaxis: intermittent pneumatic compression boots.  Plan: Continue clears Pt agrees to blood draw IVF bolus now Add muscle relaxant Monitor closely   LOS: 1 day    Olam Mill, MD 03/01/2024, 9:44 AM

## 2024-03-01 NOTE — Anesthesia Procedure Notes (Signed)
 Arterial Line Insertion Start/End7/10/2023 11:30 AM, 03/01/2024 11:30 AM Performed by: Merla Almarie HERO, DO, anesthesiologist  Patient location: Pre-op. Preanesthetic checklist: patient identified, IV checked, site marked, risks and benefits discussed, surgical consent, monitors and equipment checked, pre-op evaluation, timeout performed and anesthesia consent Lidocaine  1% used for infiltration Left, radial was placed Catheter size: 20 G Hand hygiene performed  and maximum sterile barriers used   Attempts: 1 Procedure performed without using ultrasound guided technique. Following insertion, dressing applied. Post procedure assessment: normal and unchanged  Patient tolerated the procedure well with no immediate complications.

## 2024-03-01 NOTE — Transfer of Care (Signed)
 Immediate Anesthesia Transfer of Care Note  Patient: Alexandria Durham  Procedure(s) Performed: LAPAROTOMY, EXPLORATORY COLECTOMY, WITH COLOSTOMY CREATION MOBILIZATION, SPLENIC FLEXURE, WITH PARTIAL COLECTOMY CYSTOSCOPY SIGMOIDOSCOPY  Patient Location: ICU  Anesthesia Type:General  Level of Consciousness: sedated and Patient remains intubated per anesthesia plan  Airway & Oxygen Therapy: Patient Spontanous Breathing, Patient remains intubated per anesthesia plan, and Patient placed on Ventilator (see vital sign flow sheet for setting)  Post-op Assessment: Report given to RN and Post -op Vital signs reviewed and stable  Post vital signs: Reviewed and stable  Last Vitals:  Vitals Value Taken Time  BP    Temp    Pulse    Resp    SpO2      Last Pain:  Vitals:   03/01/24 1127  TempSrc: Axillary  PainSc:       Patients Stated Pain Goal: 2 (02/29/24 1934)  Complications: No notable events documented.

## 2024-03-01 NOTE — Anesthesia Preprocedure Evaluation (Addendum)
 Anesthesia Evaluation  Patient identified by MRN, date of birth, ID band Patient awake    Reviewed: Allergy & Precautions, H&P , NPO status , Patient's Chart, lab work & pertinent test results  Airway Mallampati: II  TM Distance: >3 FB Neck ROM: Full    Dental no notable dental hx.    Pulmonary COPD, former smoker   Pulmonary exam normal breath sounds clear to auscultation       Cardiovascular hypertension, Pt. on medications Normal cardiovascular exam Rhythm:Regular Rate:Normal     Neuro/Psych negative neurological ROS  negative psych ROS   GI/Hepatic Neg liver ROS,GERD  Medicated and Controlled,,  Endo/Other  diabetes, Well ControlledHypothyroidism    Renal/GU negative Renal ROS  negative genitourinary   Musculoskeletal  (+) Arthritis , Osteoarthritis,    Abdominal   Peds negative pediatric ROS (+)  Hematology negative hematology ROS (+)   Anesthesia Other Findings   Reproductive/Obstetrics S/p D+C converted to laparoscopic for uterine perf. Now with colon on path- so suspected bowel perf                              Anesthesia Physical Anesthesia Plan  ASA: 3  Anesthesia Plan: General and Regional   Post-op Pain Management: Ofirmev  IV (intra-op)*, Regional block*, Ketamine  IV*, Dilaudid  IV and Lidocaine  infusion*   Induction: Intravenous and Rapid sequence  PONV Risk Score and Plan: 4 or greater and Ondansetron , Dexamethasone , Midazolam  and Treatment may vary due to age or medical condition  Airway Management Planned: Oral ETT  Additional Equipment: Arterial line  Intra-op Plan:   Post-operative Plan: Extubation in OR and Possible Post-op intubation/ventilation  Informed Consent: I have reviewed the patients History and Physical, chart, labs and discussed the procedure including the risks, benefits and alternatives for the proposed anesthesia with the patient or authorized  representative who has indicated his/her understanding and acceptance.     Dental advisory given  Plan Discussed with: CRNA  Anesthesia Plan Comments: (Suspected bowel perf after perforation   Access: PIV 20G x 2  Mildly hypotensive/tachycardic on floor, will place arterial line)         Anesthesia Quick Evaluation

## 2024-03-01 NOTE — Anesthesia Procedure Notes (Signed)
 Procedure Name: Intubation Date/Time: 03/01/2024 12:36 PM  Performed by: Dartha Meckel, CRNAPre-anesthesia Checklist: Patient identified, Emergency Drugs available, Suction available and Patient being monitored Patient Re-evaluated:Patient Re-evaluated prior to induction Oxygen Delivery Method: Circle system utilized Preoxygenation: Pre-oxygenation with 100% oxygen Induction Type: IV induction Ventilation: Mask ventilation without difficulty Laryngoscope Size: Glidescope and 3 Grade View: Grade I Tube type: Oral Tube size: 7.0 mm Number of attempts: 1 Airway Equipment and Method: Stylet and Oral airway Placement Confirmation: ETT inserted through vocal cords under direct vision, positive ETCO2 and breath sounds checked- equal and bilateral Secured at: 21 cm Tube secured with: Tape Dental Injury: Teeth and Oropharynx as per pre-operative assessment

## 2024-03-01 NOTE — Progress Notes (Addendum)
 MD Rogelio was notified that patient is a yellow MEWs, HR is 122, protocol initiated. She states her pain is 6/10 and she is even having pain to move her arms.Verbal order for PRN oxycodone  5mg  q6hrs.

## 2024-03-01 NOTE — Anesthesia Postprocedure Evaluation (Signed)
 Anesthesia Post Note  Patient: Alexandria Durham  Procedure(s) Performed: HYSTEROSCOPY WITH MYOSURE (Pelvis) US  INTRAOPERATIVE (Pelvis) LAPAROSCOPY, DIAGNOSTIC (Abdomen) SIGMOIDOSCOPY (Rectum)     Patient location during evaluation: PACU Anesthesia Type: General Level of consciousness: awake and alert Pain management: pain level controlled Vital Signs Assessment: post-procedure vital signs reviewed and stable Respiratory status: spontaneous breathing, nonlabored ventilation, respiratory function stable and patient connected to nasal cannula oxygen Cardiovascular status: blood pressure returned to baseline and stable Postop Assessment: no apparent nausea or vomiting Anesthetic complications: no   There were no known notable events for this encounter.  Last Vitals:  Vitals:   03/01/24 0607 03/01/24 0959  BP: 134/64 (!) 96/45  Pulse: (!) 122 (!) 123  Resp: 19 18  Temp: 36.8 C 37.2 C  SpO2: 98% 100%    Last Pain:  Vitals:   03/01/24 0959  TempSrc: Oral  PainSc:                  Aristotelis Vilardi L Tanav Orsak

## 2024-03-02 ENCOUNTER — Encounter (HOSPITAL_COMMUNITY): Payer: Self-pay | Admitting: Psychiatry

## 2024-03-02 ENCOUNTER — Inpatient Hospital Stay (HOSPITAL_COMMUNITY)

## 2024-03-02 DIAGNOSIS — N189 Chronic kidney disease, unspecified: Secondary | ICD-10-CM | POA: Diagnosis not present

## 2024-03-02 DIAGNOSIS — R6521 Severe sepsis with septic shock: Secondary | ICD-10-CM | POA: Diagnosis not present

## 2024-03-02 DIAGNOSIS — E43 Unspecified severe protein-calorie malnutrition: Secondary | ICD-10-CM | POA: Insufficient documentation

## 2024-03-02 DIAGNOSIS — E876 Hypokalemia: Secondary | ICD-10-CM | POA: Diagnosis not present

## 2024-03-02 DIAGNOSIS — A419 Sepsis, unspecified organism: Secondary | ICD-10-CM | POA: Diagnosis not present

## 2024-03-02 LAB — POCT I-STAT 7, (LYTES, BLD GAS, ICA,H+H)
Acid-base deficit: 7 mmol/L — ABNORMAL HIGH (ref 0.0–2.0)
Acid-base deficit: 9 mmol/L — ABNORMAL HIGH (ref 0.0–2.0)
Acid-base deficit: 10 mmol/L — ABNORMAL HIGH (ref 0.0–2.0)
Acid-base deficit: 9 mmol/L — ABNORMAL HIGH (ref 0.0–2.0)
Bicarbonate: 17.6 mmol/L — ABNORMAL LOW (ref 20.0–28.0)
Bicarbonate: 17 mmol/L — ABNORMAL LOW (ref 20.0–28.0)
Bicarbonate: 16.6 mmol/L — ABNORMAL LOW (ref 20.0–28.0)
Bicarbonate: 18.5 mmol/L — ABNORMAL LOW (ref 20.0–28.0)
Calcium, Ion: 1.18 mmol/L (ref 1.15–1.40)
Calcium, Ion: 1.11 mmol/L — ABNORMAL LOW (ref 1.15–1.40)
Calcium, Ion: 1.11 mmol/L — ABNORMAL LOW (ref 1.15–1.40)
Calcium, Ion: 1.09 mmol/L — ABNORMAL LOW (ref 1.15–1.40)
HCT: 29 % — ABNORMAL LOW (ref 36.0–46.0)
HCT: 23 % — ABNORMAL LOW (ref 36.0–46.0)
HCT: 35 % — ABNORMAL LOW (ref 36.0–46.0)
HCT: 33 % — ABNORMAL LOW (ref 36.0–46.0)
Hemoglobin: 9.9 g/dL — ABNORMAL LOW (ref 12.0–15.0)
Hemoglobin: 7.8 g/dL — ABNORMAL LOW (ref 12.0–15.0)
Hemoglobin: 11.9 g/dL — ABNORMAL LOW (ref 12.0–15.0)
Hemoglobin: 11.2 g/dL — ABNORMAL LOW (ref 12.0–15.0)
O2 Saturation: 100 %
O2 Saturation: 99 %
O2 Saturation: 93 %
O2 Saturation: 95 %
Patient temperature: 35.8
Potassium: 3.2 mmol/L — ABNORMAL LOW (ref 3.5–5.1)
Potassium: 3.4 mmol/L — ABNORMAL LOW (ref 3.5–5.1)
Potassium: 3.4 mmol/L — ABNORMAL LOW (ref 3.5–5.1)
Potassium: 4.4 mmol/L (ref 3.5–5.1)
Sodium: 137 mmol/L (ref 135–145)
Sodium: 137 mmol/L (ref 135–145)
Sodium: 136 mmol/L (ref 135–145)
Sodium: 135 mmol/L (ref 135–145)
TCO2: 19 mmol/L — ABNORMAL LOW (ref 22–32)
TCO2: 18 mmol/L — ABNORMAL LOW (ref 22–32)
TCO2: 18 mmol/L — ABNORMAL LOW (ref 22–32)
TCO2: 20 mmol/L — ABNORMAL LOW (ref 22–32)
pCO2 arterial: 31.4 mmHg — ABNORMAL LOW (ref 32–48)
pCO2 arterial: 34.6 mmHg (ref 32–48)
pCO2 arterial: 36.6 mmHg (ref 32–48)
pCO2 arterial: 43.8 mmHg (ref 32–48)
pH, Arterial: 7.358 (ref 7.35–7.45)
pH, Arterial: 7.293 — ABNORMAL LOW (ref 7.35–7.45)
pH, Arterial: 7.265 — ABNORMAL LOW (ref 7.35–7.45)
pH, Arterial: 7.233 — ABNORMAL LOW (ref 7.35–7.45)
pO2, Arterial: 323 mmHg — ABNORMAL HIGH (ref 83–108)
pO2, Arterial: 148 mmHg — ABNORMAL HIGH (ref 83–108)
pO2, Arterial: 77 mmHg — ABNORMAL LOW (ref 83–108)
pO2, Arterial: 90 mmHg (ref 83–108)

## 2024-03-02 LAB — COMPREHENSIVE METABOLIC PANEL WITH GFR
ALT: 158 U/L — ABNORMAL HIGH (ref 0–44)
AST: 182 U/L — ABNORMAL HIGH (ref 15–41)
Albumin: 2.8 g/dL — ABNORMAL LOW (ref 3.5–5.0)
Alkaline Phosphatase: 34 U/L — ABNORMAL LOW (ref 38–126)
Anion gap: 8 (ref 5–15)
BUN: 17 mg/dL (ref 8–23)
CO2: 17 mmol/L — ABNORMAL LOW (ref 22–32)
Calcium: 7.8 mg/dL — ABNORMAL LOW (ref 8.9–10.3)
Chloride: 103 mmol/L (ref 98–111)
Creatinine, Ser: 1.75 mg/dL — ABNORMAL HIGH (ref 0.44–1.00)
GFR, Estimated: 30 mL/min — ABNORMAL LOW (ref >60)
Glucose, Bld: 139 mg/dL — ABNORMAL HIGH (ref 70–99)
Potassium: 3.8 mmol/L (ref 3.5–5.1)
Sodium: 128 mmol/L — ABNORMAL LOW (ref 135–145)
Total Bilirubin: 1.4 mg/dL — ABNORMAL HIGH (ref 0.0–1.2)
Total Protein: 4.8 g/dL — ABNORMAL LOW (ref 6.5–8.1)

## 2024-03-02 LAB — TYPE AND SCREEN
ABO/RH(D): O POS
Antibody Screen: NEGATIVE
Unit division: 0
Unit division: 0

## 2024-03-02 LAB — CBC
HCT: 40.9 % (ref 36.0–46.0)
Hemoglobin: 13.8 g/dL (ref 12.0–15.0)
MCH: 30.5 pg (ref 26.0–34.0)
MCHC: 33.7 g/dL (ref 30.0–36.0)
MCV: 90.3 fL (ref 80.0–100.0)
Platelets: 97 10*3/uL — ABNORMAL LOW (ref 150–400)
RBC: 4.53 MIL/uL (ref 3.87–5.11)
RDW: 16.2 % — ABNORMAL HIGH (ref 11.5–15.5)
WBC: 3.7 10*3/uL — ABNORMAL LOW (ref 4.0–10.5)
nRBC: 0 % (ref 0.0–0.2)

## 2024-03-02 LAB — BPAM RBC
Blood Product Expiration Date: 202508052359
Blood Product Expiration Date: 202508052359
ISSUE DATE / TIME: 202507021353
ISSUE DATE / TIME: 202507021353
Unit Type and Rh: 5100
Unit Type and Rh: 5100

## 2024-03-02 LAB — GLUCOSE, CAPILLARY
Glucose-Capillary: 108 mg/dL — ABNORMAL HIGH (ref 70–99)
Glucose-Capillary: 104 mg/dL — ABNORMAL HIGH (ref 70–99)
Glucose-Capillary: 73 mg/dL (ref 70–99)
Glucose-Capillary: 150 mg/dL — ABNORMAL HIGH (ref 70–99)
Glucose-Capillary: 120 mg/dL — ABNORMAL HIGH (ref 70–99)
Glucose-Capillary: 86 mg/dL (ref 70–99)

## 2024-03-02 LAB — MRSA NEXT GEN BY PCR, NASAL: MRSA by PCR Next Gen: NOT DETECTED

## 2024-03-02 LAB — MAGNESIUM: Magnesium: 4.3 mg/dL — ABNORMAL HIGH (ref 1.7–2.4)

## 2024-03-02 LAB — URINE CULTURE: Culture: NO GROWTH

## 2024-03-02 LAB — LACTIC ACID, PLASMA: Lactic Acid, Venous: 3.1 mmol/L (ref 0.5–1.9)

## 2024-03-02 LAB — TRIGLYCERIDES: Triglycerides: 401 mg/dL — ABNORMAL HIGH (ref <150)

## 2024-03-02 MED ORDER — DEXTROSE 50 % IV SOLN: 1.0000 | Freq: Once | INTRAVENOUS | Status: AC

## 2024-03-02 MED ORDER — LACTATED RINGERS IV SOLN: INTRAVENOUS | Status: AC

## 2024-03-02 MED ORDER — LACTATED RINGERS IV BOLUS
500.0000 mL | Freq: Once | INTRAVENOUS | Status: AC
  Administered 2024-03-02: 500 mL via INTRAVENOUS

## 2024-03-02 MED ORDER — CHLORHEXIDINE GLUCONATE CLOTH 2 % EX PADS
6.0000 | MEDICATED_PAD | Freq: Every day | CUTANEOUS | Status: DC
  Administered 2024-03-02 – 2024-04-04 (×32): 6 via TOPICAL

## 2024-03-02 MED ORDER — DEXTROSE 50 % IV SOLN
INTRAVENOUS | Status: AC
  Administered 2024-03-02: 50 mL
  Filled 2024-03-02: qty 50

## 2024-03-02 MED ORDER — ORAL CARE MOUTH RINSE: 15.0000 mL | OROMUCOSAL | Status: DC | PRN

## 2024-03-02 MED ORDER — ORAL CARE MOUTH RINSE
15.0000 mL | OROMUCOSAL | Status: DC
  Administered 2024-03-02 – 2024-03-03 (×15): 15 mL via OROMUCOSAL

## 2024-03-02 NOTE — Plan of Care (Signed)
  Problem: Clinical Measurements: Goal: Respiratory complications will improve Outcome: Progressing Goal: Cardiovascular complication will be avoided Outcome: Progressing   Problem: Pain Managment: Goal: General experience of comfort will improve and/or be controlled Outcome: Progressing   Problem: Safety: Goal: Ability to remain free from injury will improve Outcome: Progressing   Problem: Skin Integrity: Goal: Risk for impaired skin integrity will decrease Outcome: Progressing

## 2024-03-02 NOTE — Progress Notes (Signed)
 GYN Oncology Progress Note  1 Day Post-Op Procedure(s) (LRB): LAPAROTOMY, EXPLORATORY (N/A) SIGMOIDOSCOPY COLECTOMY, WITH COLOSTOMY CREATION MOBILIZATION, SPLENIC FLEXURE, WITH PARTIAL COLECTOMY CYSTOSCOPY  Above procedure performed after recent D&C procedure on 02/29/2024 for thickened endometrium. D&C procedure complicated by bowel perforation. Patient taken back to the OR on 03/01/24 after developing signs of sepsis,shock (hypotension, tachycardia, peritonitis symptoms). She was started on zosyn on 03/01/2024 and this has been continued. She was admitted to ICU post-op on ventilation. Attempts for weaning vent slowed given mental status (sleepy).    Subjective: Patients daughter at the bedside. Patient currently intubated. Opening eyes intermittently.   Objective: Vital signs in last 24 hours: Temp:  [96.7 F (35.9 C)-98.4 F (36.9 C)] 98.4 F (36.9 C) (07/03 1200) Pulse Rate:  [82-102] 84 (07/03 1300) Resp:  [21-25] 25 (07/03 1300) BP: (96-117)/(54-76) 100/54 (07/03 0800) SpO2:  [96 %-100 %] 96 % (07/03 1300) Arterial Line BP: (78-132)/(48-70) 132/57 (07/03 1300) FiO2 (%):  [30 %-100 %] 30 % (07/03 1227) Weight:  [160 lb 0.9 oz (72.6 kg)] 160 lb 0.9 oz (72.6 kg) (07/02 1823) Last BM Date : 03/02/24  Intake/Output from previous day: 07/02 0701 - 07/03 0700 In: 8632.6 [I.V.:6720.7; Blood:630; IV Piggyback:1281.9] Out: 890 [Urine:325; Emesis/NG output:200; Drains:265; Blood:100]  Physical Examination (performed by Dr. Viktoria): General: currently intubated, opening eyes intermittently, remains somnolent Resp: currently on vent, O2 sats at 96%, lungs clear Cardio: HR 82-84 bpm, regular in rate and rhythm GI: incision: midline abdominal incision with wound VAC dressing in place connected to suction and abdomen soft, non-distended, JP drain with serosanguinous drainage, drain stripped, ostomy with small amount of liquid output, stoma remains edematous-appearance assessed by Dr.  Viktoria Extremities: extremities normal, atraumatic, no cyanosis or edema and SCDs in place Foley in place with minimal amount of tea colored urine  Labs: WBC/Hgb/Hct/Plts:  3.7/13.8/40.9/97 (07/03 0340) BUN/Cr/glu/ALT/AST/amyl/lip:  17/1.75/--/158/182/--/-- (07/03 0340)  Assessment: 78 y.o. s/p Procedure(s): LAPAROTOMY, EXPLORATORY, SIGMOIDOSCOPY, COLECTOMY, WITH COLOSTOMY CREATION, MOBILIZATION, SPLENIC FLEXURE, WITH PARTIAL COLECTOMY, CYSTOSCOPY: stable Pain:  On scheduled medications-fentanyl .  Heme: Hgb 13.8 and Hct 40.9 this am. Continue to monitor. Transfuse PRN.  ID: Currently on zosyn for bowel perforation from recent D&C. S/P exploratory laparotomy with colectomy and ostomy creation. WBC 3.7 this am. Lactic acid 3.6>3.1.  CV: On monitoring. BP and HR overall stable at this time. PLT 97 this am.   GI:  Tolerating po: NPO, currently on vent. Has small amount of serous drainage in ostomy. Stoma is edematous.  GU: Creatinine 1.75. Decreased output from foley. On IVF resuscitation.   FEN: Am metabolic panel reviewed. Repletion as needed. Mag this am at 4.3.    Endo: Diabetes. CBG (last 3)  Recent Labs    03/02/24 0342 03/02/24 0733 03/02/24 1121  GLUCAP 108* 104* 73     Prophylaxis: SCDs, heparin ordered.  Plan: Continue with current plan of care per Critical Care. Appreciate management. Continue to monitor urine output No abdominal binder until up and moving about   LOS: 2 days    Laylamarie Meuser D Olan Kurek 03/02/2024, 1:40 PM

## 2024-03-02 NOTE — TOC Initial Note (Addendum)
 Transition of Care [facility]) - Initial/Assessment Note    Patient Details  Name: Alexandria Durham MRN: [id number] Date of Birth: August 27, 1946  [facility] CM/SW Contact:    Jon ONEIDA Anon, RN Phone Number: 03/02/2024, 3:10 PM  Clinical Narrative:                 Pt is from home. Per pt daughter Alexandria Durham, pt has her own house but has been residing with her recently. She states pt has a cane that she uses at baseline. Denies any oxygen use or DME needs. Denies any SDOH needs. She states pt has recently been set up with HHPT but is unsure of the agency. Pt is currently on the ventilator. TOC will follow for any new recommendations or DC needs.  Addendum:  NCM spoke with pt daughter about SNF placement process. Explained would need a recommendation from PT/OT for SNF placement, and once pt medically stable, NCM/CSW would be able to start SNF workup. Pt daughter verbalized understanding.    Expected Discharge Plan: Home/Self Care Barriers to Discharge: Continued Medical Work up   Patient Goals and CMS Choice Patient states their goals for this hospitalization and ongoing recovery are:: Per pt daughter, pt will return home CMS Medicare.gov Compare Post Acute Care list provided to:: Other (Comment Required) (NA) Choice offered to / list presented to : NA Leon ownership interest in [facility]provided to:: Parent NA    Expected Discharge Plan and Services In-house Referral: NA Discharge Planning Services: CM Consult Post Acute Care Choice: NA Living arrangements for the past 2 months: Single Family Home                 DME Arranged: N/A DME Agency: NA       HH Arranged: NA HH Agency: NA        Prior Living Arrangements/Services Living arrangements for the past 2 months: Single Family Home Lives with:: Adult Children Patient language and need for interpreter reviewed:: Yes Do you feel safe going back to the place where you live?: Yes      Need  for Family Participation in Patient Care: Yes (Comment) Care giver support system in place?: Yes (comment) Current home services: DME (Pt has a cane) Criminal Activity/Legal Involvement Pertinent to Current Situation/Hospitalization: No - Comment as needed  Activities of Daily Living   ADL Screening (condition at time of admission) Independently performs ADLs?: Yes (appropriate for developmental age) Is the patient deaf or have difficulty hearing?: No Does the patient have difficulty seeing, even when wearing glasses/contacts?: No Does the patient have difficulty concentrating, remembering, or making decisions?: No  Permission Sought/Granted Permission sought to share information with : Family Supports Permission granted to share information with : Yes, Verbal Permission Granted  Share Information with NAME: Durham Alexandria (Daughter)  [phone]           Emotional Assessment Appearance:: Appears stated age Attitude/Demeanor/Rapport: Unable to Assess (UTA due to being on ventilator) Affect (typically observed): Unable to Assess (Due to being on the ventilator) Orientation: :  (Unable to assess due to being on the ventilator) Alcohol / Substance Use: Not Applicable Psych Involvement: No (comment)  Admission diagnosis:  Thickened endometrium [R93.89] Postmenopause bleeding [N95.0] Postmenopausal bleeding [N95.0] Patient Active Problem List   Diagnosis Date Noted   Protein-calorie malnutrition, severe 03/02/2024   Postmenopausal bleeding 02/29/2024   Acute diverticulitis 02/08/2024   AKI (acute kidney injury) (HCC) 02/08/2024   Dehydration 02/08/2024   Endometritis 01/19/2024  Acute pancreatitis 01/11/2024   Abdominal pain 01/06/2024   Thickened endometrium 01/06/2024   Uterine perforation 01/06/2024   Diverticulitis 01/02/2024   Acute idiopathic gout of left foot 12/23/2023   Avascular necrosis of bone of hip, left (HCC) 12/23/2023   Carpal tunnel syndrome of left  wrist 09/20/2019   Bilateral carotid artery stenosis 05/10/2019   PVD (peripheral vascular disease) (HCC) 05/10/2019   Lumbar radiculopathy 07/06/2018   Lumbar spondylosis 07/06/2018   Stage 3a chronic kidney disease (HCC) 06/06/2018   Mixed hyperlipidemia 02/24/2018   Cervical radiculopathy 09/29/2017   Former smoker 05/06/2017   Postoperative hypothyroidism 04/14/2017   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   Cortical age-related cataract of both eyes 11/25/2016   Asymmetric septal hypertrophy 04/30/2016   Gastroesophageal reflux disease without esophagitis 03/02/2016   Cardiac murmur, unspecified 02/24/2016   Benign hypertension with CKD (chronic kidney disease) stage III (HCC) 01/01/2016   Vitamin D  deficiency 12/31/2015   Essential hypertension 11/21/2015   Eczema 08/28/2015   Type 2 diabetes mellitus (HCC) 08/28/2015   Type 2 diabetes mellitus with diabetic polyneuropathy, without long-term current use of insulin  (HCC) 08/28/2015   PCP:  [name], NP Pharmacy:   [facility] [phone] - [place], [place] - [address] [place] [address] Phone: [phone] Fax: [phone]     Social Drivers of Health (SDOH) Social History: SDOH Screenings   Food Insecurity: No Food Insecurity (02/29/2024)  Housing: High Risk (02/29/2024)  Transportation Needs: No Transportation Needs (02/29/2024)  Utilities: Not At Risk (02/29/2024)  Recent Concern: Utilities - At Risk (02/09/2024)  Social Connections: Patient Declined (02/29/2024)  Tobacco Use: Medium Risk (03/01/2024)   SDOH Interventions:     Readmission Risk Interventions    03/02/2024    3:05 PM  Readmission Risk Prevention Plan  Transportation Screening Complete  PCP or Specialist Appt within 3-5 Days Complete  HRI or Home Care Consult Complete  Social Work Consult for Recovery Care Planning/Counseling Complete  Palliative Care Screening Not Applicable  Medication Review Special educational needs teacher) Complete

## 2024-03-02 NOTE — Progress Notes (Signed)
 NAME:  TENILLE MORRILL, MRN:  [id number], DOB:  1945-12-21, LOS: 2 ADMISSION DATE:  02/29/2024, CONSULTATION DATE:  03/02/24 REFERRING MD:  [name], CHIEF COMPLAINT:  post-op ventilator management    History of Present Illness:  Ally Knodel is a 78 y.o.  F with PMH significant for thickened endometrium, HTN, Type 2 DM, CKD stage IIIa, recent pancreatitis and acute cholecystitis s/p lap chole and D&C in May of this year who was admitted 7/1 for hysteroscopy and repeat D&C.  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 Pt was acidotic post-procedure, so decision made to leave intubated, PCCM consulted in this setting.   Pertinent  Medical History   has a past medical history of Arthritis, Chronic kidney disease, COPD (chronic obstructive pulmonary disease) (HCC), Diabetes mellitus, Family history of adverse reaction to anesthesia, GERD (gastroesophageal reflux disease), Heart murmur, Hypercholesteremia, Hypertension, and Hypothyroidism.   Significant Hospital Events: Including procedures, antibiotic start and stop dates in addition to other pertinent events   7/2 admit to ICU post-op, ventilated  7/3 pt remains on low dose neo, weaned off sedation but mental status still precludes SBT  Interim History / Subjective:  No acute overnight events, tolerating SAT but mental status too sleepy to tolerate SBT thus far this morning   Objective    Blood pressure 108/64, pulse 86, temperature (!) 97.4 F (36.3 C), temperature source Axillary, resp. rate (!) 24, height 5' 4 (1.626 m), weight 72.6 kg, SpO2 100%.    Vent Mode: PRVC FiO2 (%):  [50 %-100 %] 50 % Set Rate:  [24 bmp] 24 bmp Vt Set:  [430 mL] 430 mL PEEP:  [5 cmH20] 5 cmH20 Plateau Pressure:  [16 cmH20-18 cmH20] 16 cmH20   Intake/Output Summary (Last 24 hours) at 03/02/2024 0817 Last data filed at  03/02/2024 0751 Gross per 24 hour  Intake 8784.56 ml  Output 890 ml  Net 7894.56 ml   Filed Weights   02/29/24 0553 03/01/24 1127 03/01/24 1823  Weight: 64.4 kg 64 kg 72.6 kg    General:  well nourished elderly F, intubated and sedated HEENT: MM pink/moist, ETT in place, sclera anicterc, pupils equal and responsive  Neuro: examined on propofol , slightly opens eyes to voice, but not otherwise following commands  CV: s1s2 rrr, no m/r/g PULM:  clear bilaterally on mechanical ventilation, synchronous with vent  GI: soft, ileostomy, JP drain and midline incision clean and dry Extremities: warm/dry, no edema     Resolved problem list   Assessment and Plan    Post-op ventilator management  -Propofol  and Fentanyl  for PAD protocol -ABG, CXR  pending --Maintain full vent support with SAT/SBT as tolerated, too somnolent to tolerate SBT thus far this AM -titrate Vent setting to maintain SpO2 greater than or equal to 90%. -HOB elevated 30 degrees. -Plateau pressures less than 30 cm H20.  -Follow chest x-ray, ABG prn.   -Bronchial hygiene and RT/bronchodilator protocol.   S/p partial colectomy and ileostomy  Septic shock  -management per primary team  -continue zosyn - follow blood cultures -lactic acid 3.6>3.1,  -neosynephrine to maintain MAP >65   Hypokalemia  Hypomagnesemia  CKD  -trend and replete electrolytes prn, Mag 0.8 overnight, repeat 4.3 this AM -UOP minimal 0.2cc/kg/hr, net positive 7.7L, CVP 8 -Creatine 1.75 up from 1.58 yesterday, monitor likely worsening in the setting of shock, do  not think additional volume is currently indicated    Acute Thrombocytopenia  Platelets 97 down from 104 yesterday -no signs of active bleeding and Hgb 13.8 -continue to trend and transfuse prn   Type 2 DM  -SSI      Best Practice (right click and Reselect all SmartList Selections daily)   Diet/type: NPO DVT prophylaxis SCD Pressure ulcer(s): N/A GI prophylaxis:  PPI Lines: Central line Foley:  Yes, and it is still needed Code Status:  full code Last date of multidisciplinary goals of care discussion [per primary]  Labs   CBC: Recent Labs  Lab 03/01/24 1056 03/01/24 1244 03/01/24 1348 03/01/24 1503 03/01/24 1604 03/01/24 2005 03/02/24 0340  WBC 2.4*  --   --   --   --  2.3* 3.7*  HGB 11.7*   < > 7.8* 11.9* 11.2* 13.6 13.8  HCT 35.3*   < > 23.0* 35.0* 33.0* 40.2 40.9  MCV 92.7  --   --   --   --  90.7 90.3  PLT 136*  --   --   --   --  104* 97*   < > = values in this interval not displayed.    Basic Metabolic Panel: Recent Labs  Lab 03/01/24 1056 03/01/24 1244 03/01/24 1348 03/01/24 1503 03/01/24 1604 03/01/24 2005 03/02/24 0340  NA 133*   < > 137 136 135 130* 128*  K 3.4*   < > 3.4* 3.4* 4.4 4.0 3.8  CL 101  --   --   --   --  99 103  CO2 17*  --   --   --   --  17* 17*  GLUCOSE 146*  --   --   --   --  151* 139*  BUN 16  --   --   --   --  17 17  CREATININE 1.59*  --   --   --   --  1.58* 1.75*  CALCIUM  8.7*  --   --   --   --  8.3* 7.8*  MG  --   --   --   --   --  0.8* 4.3*   < > = values in this interval not displayed.   GFR: Estimated Creatinine Clearance: 26.7 mL/min (A) (by C-G formula based on SCr of 1.75 mg/dL (H)). Recent Labs  Lab 03/01/24 1056 03/01/24 2005 03/01/24 2006 03/02/24 0340  WBC 2.4* 2.3*  --  3.7*  LATICACIDVEN  --   --  3.6* 3.1*    Liver Function Tests: Recent Labs  Lab 03/02/24 0340  AST 182*  ALT 158*  ALKPHOS 34*  BILITOT 1.4*  PROT 4.8*  ALBUMIN 2.8*   No results for input(s): LIPASE, AMYLASE in the last 168 hours. No results for input(s): AMMONIA in the last 168 hours.  ABG    Component Value Date/Time   PHART 7.34 (L) 03/01/2024 1943   PCO2ART 27 (L) 03/01/2024 1943   PO2ART 197 (H) 03/01/2024 1943   HCO3 14.6 (L) 03/01/2024 1943   TCO2 20 (L) 03/01/2024 1604   ACIDBASEDEF 9.6 (H) 03/01/2024 1943   O2SAT 100 03/01/2024 1943     Coagulation Profile: No  results for input(s): INR, PROTIME in the last 168 hours.  Cardiac Enzymes: No results for input(s): CKTOTAL, CKMB, CKMBINDEX, TROPONINI in the last 168 hours.  HbA1C: Hgb A1c MFr Bld  Date/Time Value Ref Range Status  01/03/2024 04:34 AM 6.8 (H) 4.8 - 5.6 % Final  Comment:    (NOTE) Pre diabetes:          5.7%-6.4%  Diabetes:              >6.4%  Glycemic control for   <7.0% adults with diabetes     CBG: Recent Labs  Lab 03/01/24 0800 03/01/24 1942 03/01/24 2324 03/02/24 0342 03/02/24 0733  GLUCAP 158* 140* 135* 108* 104*    Review of Systems:   Unable to obtain  Past Medical History:  She,  has a past medical history of Arthritis, Chronic kidney disease, COPD (chronic obstructive pulmonary disease) (HCC), Diabetes mellitus, Family history of adverse reaction to anesthesia, GERD (gastroesophageal reflux disease), Heart murmur, Hypercholesteremia, Hypertension, and Hypothyroidism.   Surgical History:   Past Surgical History:  Procedure Laterality Date   BREAST CYST EXCISION Left    benign cyst   CHOLECYSTECTOMY N/A 01/17/2024   Procedure: LAPAROSCOPIC CHOLECYSTECTOMY WITH INTRAOPERATIVE CHOLANGIOGRAM;  Surgeon: Sheldon Standing, MD;  Location: [facility];  Service: General;  Laterality: N/A;   DILATION AND CURETTAGE OF UTERUS N/A 01/17/2024   Procedure: DILATION AND CURETTAGE;  Surgeon: [name] Mays, MD;  Location: [facility];  Service: Gynecology;  Laterality: N/A;   EYE SURGERY Left    cataract removal   HEMORRHOID SURGERY     HIP SURGERY     HYSTEROSCOPY WITH D & C N/A 01/06/2024   Procedure: DILATATION AND CURETTAGE /HYSTEROSCOPY;  Surgeon: Cleatus Moccasin, MD;  Location: [facility];  Service: Gynecology;  Laterality: N/A;  PAP SMEAR (DR TO BRING SUPPLIES)  TUCKERS CARD   HYSTEROSCOPY WITH D & C N/A 02/29/2024   Procedure: HYSTEROSCOPY WITH MYOSURE;  Surgeon: [name] Mays, MD;  Location: [facility];  Service: Gynecology;  Laterality: N/A;  myosure, possible  ultrasound guidance   JOINT REPLACEMENT     hip replacement   LAPAROSCOPIC LYSIS OF ADHESIONS  01/06/2024   Procedure: LYSIS, ADHESIONS, LAPAROSCOPIC;  Surgeon: Cleatus Moccasin, MD;  Location: [facility];  Service: Gynecology;;   LAPAROSCOPY N/A 01/06/2024   Procedure: LAPAROSCOPY, DIAGNOSTIC;  Surgeon: Cleatus Moccasin, MD;  Location: [facility];  Service: Gynecology;  Laterality: N/A;   LAPAROSCOPY N/A 02/29/2024   Procedure: LAPAROSCOPY, DIAGNOSTIC;  Surgeon: [name] Mays, MD;  Location: [facility];  Service: Gynecology;  Laterality: N/A;   OPERATIVE ULTRASOUND N/A 02/29/2024   Procedure: US  INTRAOPERATIVE;  Surgeon: [name] Mays, MD;  Location: [facility];  Service: Gynecology;  Laterality: N/A;   SIGMOIDOSCOPY N/A 02/29/2024   Procedure: SIGMOIDOSCOPY;  Surgeon: Debby Hila, MD;  Location: [facility];  Service: General;  Laterality: N/A;  Rigid sigmoidoscopy   THYROID  SURGERY     TONSILLECTOMY     TUBAL LIGATION       Social History:   reports that she has quit smoking. Her smoking use included cigarettes. She started smoking about 58 years ago. She has never been exposed to tobacco smoke. She has never used smokeless tobacco. She reports that she does not drink alcohol and does not use drugs.   Family History:  Her family history is negative for Breast cancer, Prostate cancer, Pancreatic cancer, Colon cancer, Endometrial cancer, and Ovarian cancer.   Allergies Allergies  Allergen Reactions   Penicillins Nausea And Vomiting, Palpitations, Dermatitis, Rash and Other (See Comments)    Patient reported an elevated heart rate   Ace Inhibitors Hives and Itching   Tape Other (See Comments)    Skin is sensitive   Aspirin Palpitations   Codeine Palpitations     Home Medications  Prior  to Admission medications   Medication Sig Start Date End Date Taking? Authorizing Provider  bisacodyl  5 MG EC tablet Take 5 mg by mouth daily as needed for moderate constipation.   Yes [provider]   buPROPion  (WELLBUTRIN  XL) 150 MG 24 hr tablet Take 150 mg by mouth every evening.   Yes [provider]  Cholecalciferol  (VITAMIN D3) 25 MCG (1000 UT) CAPS Take 1,000 Units by mouth daily.   Yes [provider]  Cyanocobalamin  (VITAMIN B-12 PO) Take 1 tablet by mouth in the morning.   Yes [provider]  diphenhydramine -acetaminophen  (TYLENOL  PM) 25-500 MG TABS tablet Take 2 tablets by mouth at bedtime as needed (Sleep/Pain).   Yes [provider]  ezetimibe  (ZETIA ) 10 MG tablet Take 10 mg by mouth daily. 10/29/23 10/28/24 Yes [provider]  ferrous sulfate 325 (65 FE) MG tablet Take 325 mg by mouth daily with breakfast.   Yes [provider]  fluticasone  (FLONASE ) 50 MCG/ACT nasal spray Place 2 sprays into both nostrils daily as needed for rhinitis. 02/15/24  Yes Cheryle Page, MD  hydrochlorothiazide  (HYDRODIURIL ) 25 MG tablet Take 1 tablet (25 mg total) by mouth daily. 01/10/24  Yes Barbarann Nest, MD  levothyroxine  (SYNTHROID ) 75 MCG tablet Take 75 mcg by mouth daily before breakfast. 09/22/22  Yes [provider]  losartan  (COZAAR ) 50 MG tablet Take 50 mg by mouth in the morning.   Yes [provider]  magnesium  oxide (MAG-OX) 400 MG tablet Take 400 mg by mouth daily. 03/01/17  Yes [provider]  methocarbamol  (ROBAXIN ) 500 MG tablet Take 1 tablet (500 mg total) by mouth 2 (two) times daily as needed for muscle spasms. 05/25/21  Yes Joldersma, Logan, PA-C  mometasone (ELOCON) 0.1 % cream Apply 1 Application topically daily as needed (Dermatitis- affected sites). 10/27/23  Yes [provider]  omeprazole (PRILOSEC) 40 MG capsule Take 40 mg by mouth daily before breakfast.   Yes [provider]  polycarbophil (FIBERCON) 625 MG tablet Take 1 tablet (625 mg total) by mouth 2 (two) times daily. 01/09/24  Yes Barbarann Nest, MD  polyethylene glycol powder (GLYCOLAX /MIRALAX ) 17 GM/SCOOP powder Take 17 g by  mouth 2 (two) times daily as needed for moderate constipation. 08/15/15  Yes [provider]  sitaGLIPtin (JANUVIA) 50 MG tablet Take 50 mg by mouth daily. 10/28/22  Yes [provider]  triamcinolone ointment (KENALOG) 0.5 % Apply 1 Application topically 2 (two) times daily as needed (for itching).   Yes [provider]  TYLENOL  8 HOUR ARTHRITIS PAIN 650 MG CR tablet Take 650-1,300 mg by mouth every 8 (eight) hours as needed for pain.   Yes [provider]  ondansetron  (ZOFRAN ) 4 MG tablet Take 1 tablet (4 mg total) by mouth every 6 (six) hours as needed for nausea. 02/15/24   Cheryle Page, MD     Critical care time:   32 minutes      CRITICAL CARE Performed by: Leita SAUNDERS Bernardino Dowell   Total critical care time: 32 minutes  Critical care time was exclusive of separately billable procedures and treating other patients.  Critical care was necessary to treat or prevent imminent or life-threatening deterioration.  Critical care was time spent personally by me on the following activities: development of treatment plan with patient and/or surrogate as well as nursing, discussions with consultants, evaluation of patient's response to treatment, examination of patient, obtaining history from patient or surrogate, ordering and performing treatments and interventions, ordering and  review of laboratory studies, ordering and review of radiographic studies, pulse oximetry and re-evaluation of patient's condition.   Leita SAUNDERS Jenia Klepper, PA-C [facility] See Amion for pager If no response to pager , please call [phone] until 7pm After 7:00 pm call Elink  [phone]

## 2024-03-02 NOTE — Op Note (Signed)
 03/01/2024  7:33 AM  PATIENT:  Alexandria Durham  78 y.o. female  Patient Care Team: [name], NP as PCP - General (Nurse Practitioner) Sheldon Standing, MD as Consulting Physician (General Surgery) Tobie Grange, MD as Referring Physician (Endocrinology)  PRE-OPERATIVE DIAGNOSIS:  Suspected bowel perforation  POST-OPERATIVE DIAGNOSIS:  proximal sigmoid perforation  PROCEDURE:  FLEXIBLE SIGMOIDOSCOPY     Surgeon(s): [name] Mays, MD Rogelio Planas, MD Debby Hila, MD  ASSISTANT: none   ANESTHESIA:   general  EBL:  Total I/O In: 3.1 [I.V.:2.9; IV Piggyback:0.2] Out: -    SPECIMEN:  No Specimen  DISPOSITION OF SPECIMEN:  N/A  COUNTS:  YES  PLAN OF CARE: patient already admitted  PATIENT DISPOSITION:  ICU - intubated and hemodynamically stable.  INDICATION: 78 y.o. F with sepsis and suspected bowel perforation   OR FINDINGS: proximal sigmoid perforation  DESCRIPTION: I was called to the OR to evaluate for possible bowel injury.  This was not immediately apparent so we elected to perform a flexible sigmoidoscopy.   A surgical timeout was performed indicating the correct patient, procedure, positioning and need for preoperative antibiotics.  I inserted the scope into the anus.  The colon was unprepped.  I was able to advance to the proximal rectum and insufflate.  This allowed us  to identify the proximal sigmoid perforation.  I then desufflated the colon and returned the procedure back to the attending surgeon for resection and colostomy.  Hila JAYSON Debby, MD  Colorectal and General Surgery [facility]

## 2024-03-02 NOTE — Progress Notes (Signed)
   03/02/24 0855  Vent Select  Invasive or Noninvasive Invasive  Adult Vent Y  Airway 7 mm  Placement Date/Time: 03/01/24 (c) 1236   Grade View: Grade 1  Airway Device: Endotracheal Tube  Laryngoscope Blade: 3  ETT Types: Oral  Size (mm): 7 mm  Cuffed: Min.occ.pres.  Insertion attempts: 1  Airway Equipment: Stylet  Placement Confirmation: ET...  Secured at (cm) 21 cm  Measured From Lips  Secured Location Right  Secured By English as a second language teacher No  Tube Holder Repositioned Yes  Prone position No  Head position Left  Cuff Pressure (cm H2O) Green OR 18-26 CmH2O  Site Condition Drainage (Comment)  Adult Ventilator Settings  Vent Type Servo i  Humidity HME  Vent Mode PRVC  Vt Set 430 mL  Set Rate 24 bmp  FiO2 (%) (S)  40 % (Weaned to 40%, Sp02 100% on 50% FI02.)  I Time 0.86 Sec(s)  PEEP 5 cmH20  Adult Ventilator Measurements  Peak Airway Pressure 19 L/min  Mean Airway Pressure 10 cmH20  Plateau Pressure 18 cmH20  Resp Rate Spontaneous 0 br/min  Resp Rate Total 24 br/min  Exhaled Vt 427 mL  Measured Ve 9.3 L  I:E Ratio Measured 1:1.9  Total PEEP 5 cmH20  SpO2 100 %  Adult Ventilator Alarms  Alarms On Y  Ve High Alarm 15 L/min  Ve Low Alarm 6 L/min  Resp Rate High Alarm 38 br/min  Resp Rate Low Alarm 12  PEEP Low Alarm 3 cmH2O  Press High Alarm 35 cmH2O  T Apnea 20 sec(s)  VAP Prevention  HOB> 30 Degrees Y  Daily Weaning Assessment  Daily Assessment of Readiness to Wean (S)  Wean protocol criteria met (SBT performed) (RN addressed wake up assessment.)  Reason not met Apnea  SBT Method (S)  CPAP 5 cm H20 and PS 5 cm H20 (Increased PSV 10/5, still apneic, placed back on full support.)  Breath Sounds  Bilateral Breath Sounds Rhonchi  Vent Respiratory Assessment  Level of Consciousness Responds to Pain (Pt opening her eyes periodically.)  Respiratory Pattern Regular;Unlabored  Suction Method  Respiratory Interventions Airway suction;Oral suction  Oral  Suctioning/Secretions  Suction Type Oral  Suction Device Yankauer  Secretion Amount Moderate  Secretion Color White  Secretion Consistency Thin;Thick  Suction Tolerance Tolerated fairly well  Suctioning Adverse Effects None  Airway Suctioning/Secretions  Suction Type ETT  Suction Device  Catheter  Secretion Amount Small  Secretion Color White  Secretion Consistency Thin;Thick  Suction Tolerance (S)  Tolerated fairly well (Pt appears agitated with suctioning.)  Suctioning Adverse Effects None

## 2024-03-02 NOTE — Progress Notes (Signed)
 Initial Nutrition Assessment  DOCUMENTATION CODES:   Severe malnutrition in context of acute illness/injury  INTERVENTION:  - If patient to remain intubated and able to start tube feeds, would recommend: Vital 1.5 at 40 ml/h (960 ml per day) *Would recommend starting at 21mL/hr and advancing by 10mL Q12H Prosource TF20 60 ml BID Provides 1600 kcal, 105 gm protein, 733 ml free water daily  - Monitor magnesium , potassium, and phosphorus BID for at least 3 days, MD to replete as needed, as pt is at risk for refeeding syndrome given malnutrition with significant weight loss and minimal nutrition x2 months. - Recommend 100mg  thiamine x5 days.   - If unable to start tube feeds/advance oral diet if extubated in the next 24-48 hours, would recommend TPN.  NUTRITION DIAGNOSIS:   Severe Malnutrition related to acute illness as evidenced by energy intake < or equal to 50% for > or equal to 5 days, mild muscle depletion, percent weight loss (10% in 1.5 months).  GOAL:   Patient will meet greater than or equal to 90% of their needs  MONITOR:   Vent status, Labs, Weight trends  REASON FOR ASSESSMENT:   Ventilator    ASSESSMENT:   78 y.o.  F with PMH significant for thickened endometrium, HTN, Type 2 DM, CKD stage IIIa, recent pancreatitis and acute cholecystitis s/p lap chole and D&C in May of this year who was admitted 7/1 for hysteroscopy and repeat D&C.   7/1 hysteroscopy and repeat D&C. 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 She estimates patient's UBW to be around 170# and reports the patient has lost a lot of weight since the beginning of May.  Per chart review, patient weighed at 156# on  5/19 and weighed this admission at 141#. This is a 15# or 10% weight loss in 1.5 months, which is significant for the time frame.   Daughter reports the patient has not eaten hardly anything since May. Shares she went almost 3 weeks with no intake and since then family has encouraged her to consume broth and ONS but she has been taking in very little. Was trying to consume Premier Protein at home but was not consuming every day.   Patient has now been NPO since admission 2 days ago. Remains on the ventilator after surgery yesterday but mental status main barrier to extubation at this time.  OGT in place to LIS at this time.  No plan for TF's at this time. Will monitor for nutrition plans.   Admit weight: 142# Current weight: 160# I&O's: +9.6L since admit  Medications reviewed and include: Colace, Miralax , Protonix  Neo @ 60 mcg/min  Labs reviewed:  Na 128 Creatinine 1.75 Magnesium  4.3 HA1C 6.8 (as of 01/03/24) Blood Glucose 104-158 x24 hours   NUTRITION - FOCUSED PHYSICAL EXAM:  Flowsheet Row Most Recent Value  Orbital Region Mild depletion  Upper Arm Region No depletion  Thoracic and Lumbar Region No depletion  Buccal Region Unable to assess  Temple Region Moderate depletion  Clavicle Bone Region No depletion  Clavicle and Acromion Bone Region Mild depletion  Scapular Bone Region Unable to assess  Dorsal Hand No depletion  Patellar Region Mild depletion  Anterior Thigh Region Mild depletion  Posterior Calf Region Mild depletion  Edema (RD Assessment) None  Hair Reviewed  Eyes Unable to assess  Mouth Unable to assess  Skin Reviewed  Nails Reviewed    Diet Order:   Diet Order             Diet NPO time specified  Diet effective now                   EDUCATION NEEDS:  No education needs have been identified at this time  Skin:  Skin Assessment: Skin Integrity Issues: Skin Integrity Issues:: Incisions Incisions: Abdomen  Last BM:  7/3 -  colostomy  Height:  Ht Readings from Last 1 Encounters:  03/01/24 5' 4 (1.626 m)   Weight:  Wt Readings from Last 1 Encounters:  03/01/24 72.6 kg   Ideal Body Weight:  54.55 kg  BMI:  Body mass index is 27.47 kg/m.  Estimated Nutritional Needs:  Kcal:  1600-1900 kcals Protein:  95-110 grams Fluid:  >/= 1.6L    Trude Ned RD, LDN Contact via Secure Chat.

## 2024-03-02 NOTE — Progress Notes (Signed)
 The daughter took home the patient's personal belongings (cane, phone, clothes, etc).

## 2024-03-03 DIAGNOSIS — E876 Hypokalemia: Secondary | ICD-10-CM | POA: Diagnosis not present

## 2024-03-03 DIAGNOSIS — N189 Chronic kidney disease, unspecified: Secondary | ICD-10-CM | POA: Diagnosis not present

## 2024-03-03 DIAGNOSIS — R6521 Severe sepsis with septic shock: Secondary | ICD-10-CM | POA: Diagnosis not present

## 2024-03-03 DIAGNOSIS — A419 Sepsis, unspecified organism: Secondary | ICD-10-CM | POA: Diagnosis not present

## 2024-03-03 LAB — BASIC METABOLIC PANEL WITH GFR
Anion gap: 16 — ABNORMAL HIGH (ref 5–15)
BUN: 22 mg/dL (ref 8–23)
CO2: 15 mmol/L — ABNORMAL LOW (ref 22–32)
Calcium: 7.8 mg/dL — ABNORMAL LOW (ref 8.9–10.3)
Chloride: 102 mmol/L (ref 98–111)
Creatinine, Ser: 2.07 mg/dL — ABNORMAL HIGH (ref 0.44–1.00)
GFR, Estimated: 24 mL/min — ABNORMAL LOW (ref >60)
Glucose, Bld: 84 mg/dL (ref 70–99)
Potassium: 3.4 mmol/L — ABNORMAL LOW (ref 3.5–5.1)
Sodium: 133 mmol/L — ABNORMAL LOW (ref 135–145)

## 2024-03-03 LAB — BETA-HYDROXYBUTYRIC ACID: Beta-Hydroxybutyric Acid: 0.08 mmol/L (ref 0.05–0.27)

## 2024-03-03 LAB — GLUCOSE, CAPILLARY
Glucose-Capillary: 80 mg/dL (ref 70–99)
Glucose-Capillary: 103 mg/dL — ABNORMAL HIGH (ref 70–99)
Glucose-Capillary: 62 mg/dL — ABNORMAL LOW (ref 70–99)
Glucose-Capillary: 76 mg/dL (ref 70–99)
Glucose-Capillary: 56 mg/dL — ABNORMAL LOW (ref 70–99)
Glucose-Capillary: 70 mg/dL (ref 70–99)
Glucose-Capillary: 112 mg/dL — ABNORMAL HIGH (ref 70–99)
Glucose-Capillary: 96 mg/dL (ref 70–99)

## 2024-03-03 LAB — PHOSPHORUS: Phosphorus: 3.6 mg/dL (ref 2.5–4.6)

## 2024-03-03 LAB — CBC
HCT: 40.1 % (ref 36.0–46.0)
Hemoglobin: 13.9 g/dL (ref 12.0–15.0)
MCH: 30.3 pg (ref 26.0–34.0)
MCHC: 34.7 g/dL (ref 30.0–36.0)
MCV: 87.4 fL (ref 80.0–100.0)
Platelets: 79 K/uL — ABNORMAL LOW (ref 150–400)
RBC: 4.59 MIL/uL (ref 3.87–5.11)
RDW: 16.6 % — ABNORMAL HIGH (ref 11.5–15.5)
WBC: 5.1 K/uL (ref 4.0–10.5)
nRBC: 0 % (ref 0.0–0.2)

## 2024-03-03 LAB — MAGNESIUM: Magnesium: 2.2 mg/dL (ref 1.7–2.4)

## 2024-03-03 MED ORDER — DEXTROSE 50 % IV SOLN: 12.5000 g | Freq: Once | INTRAVENOUS | Status: AC

## 2024-03-03 MED ORDER — DEXTROSE 50 % IV SOLN
INTRAVENOUS | Status: AC
  Filled 2024-03-03: qty 50

## 2024-03-03 MED ORDER — SIMETHICONE 40 MG/0.6ML PO SUSP: 80.0000 mg | Freq: Four times a day (QID) | ORAL | Status: DC | PRN

## 2024-03-03 MED ORDER — ONDANSETRON HCL 4 MG/2ML IJ SOLN: 4.0000 mg | Freq: Four times a day (QID) | INTRAMUSCULAR | Status: DC | PRN

## 2024-03-03 MED ORDER — DEXTROSE IN LACTATED RINGERS 5 % IV SOLN: INTRAVENOUS | Status: DC

## 2024-03-03 MED ORDER — DEXTROSE 50 % IV SOLN
12.5000 g | Freq: Once | INTRAVENOUS | Status: AC
  Administered 2024-03-03: 12.5 g via INTRAVENOUS

## 2024-03-03 MED ORDER — ORAL CARE MOUTH RINSE
15.0000 mL | OROMUCOSAL | Status: DC | PRN
  Administered 2024-03-05: 15 mL via OROMUCOSAL

## 2024-03-03 MED ORDER — DEXTROSE 50 % IV SOLN
12.5000 g | INTRAVENOUS | Status: AC
  Administered 2024-03-03: 12.5 g via INTRAVENOUS

## 2024-03-03 MED ORDER — DEXTROSE 50 % IV SOLN
INTRAVENOUS | Status: AC
  Administered 2024-03-03: 12.5 g via INTRAVENOUS
  Filled 2024-03-03: qty 50

## 2024-03-03 MED ORDER — ACETAMINOPHEN 160 MG/5ML PO SOLN
1000.0000 mg | Freq: Two times a day (BID) | ORAL | Status: DC
  Administered 2024-03-03 (×2): 1000 mg
  Filled 2024-03-03 (×2): qty 40.6

## 2024-03-03 MED ORDER — LEVOTHYROXINE SODIUM 75 MCG PO TABS
75.0000 ug | ORAL_TABLET | Freq: Every day | ORAL | Status: DC
  Administered 2024-03-03 – 2024-03-09 (×3): 75 ug
  Filled 2024-03-03 (×5): qty 1

## 2024-03-03 MED ORDER — ONDANSETRON HCL 4 MG PO TABS: 4.0000 mg | ORAL_TABLET | Freq: Four times a day (QID) | ORAL | Status: DC | PRN

## 2024-03-03 MED ORDER — TRAMADOL HCL 50 MG PO TABS: 100.0000 mg | ORAL_TABLET | Freq: Two times a day (BID) | ORAL | Status: DC | PRN

## 2024-03-03 MED ORDER — POTASSIUM CHLORIDE 10 MEQ/50ML IV SOLN
10.0000 meq | INTRAVENOUS | Status: AC
  Administered 2024-03-03 (×5): 10 meq via INTRAVENOUS
  Filled 2024-03-03 (×5): qty 50

## 2024-03-03 MED ORDER — OXYCODONE HCL 5 MG/5ML PO SOLN: 5.0000 mg | ORAL | Status: DC | PRN

## 2024-03-03 MED ORDER — EZETIMIBE 10 MG PO TABS
10.0000 mg | ORAL_TABLET | Freq: Every day | ORAL | Status: DC
  Administered 2024-03-03 – 2024-03-09 (×4): 10 mg
  Filled 2024-03-03 (×4): qty 1

## 2024-03-03 NOTE — Progress Notes (Signed)
 NAME:  NIRALI MAGOUIRK, MRN:  [id number], DOB:  22-Jan-1946, LOS: 3 ADMISSION DATE:  02/29/2024, CONSULTATION DATE:  03/03/24 REFERRING MD:  [name], CHIEF COMPLAINT:  post-op ventilator management    History of Present Illness:   Alexandria Durham is a 78 y.o.  F with PMH significant for thickened endometrium, HTN, Type 2 DM, CKD stage IIIa, recent pancreatitis and acute cholecystitis s/p lap chole and D&C in May of this year who was admitted 7/1 for hysteroscopy and repeat D&C.  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 Pt was acidotic post-procedure, so decision made to leave intubated, PCCM consulted in this setting.   Pertinent  Medical History   has a past medical history of Arthritis, Chronic kidney disease, COPD (chronic obstructive pulmonary disease) (HCC), Diabetes mellitus, Family history of adverse reaction to anesthesia, GERD (gastroesophageal reflux disease), Heart murmur, Hypercholesteremia, Hypertension, and Hypothyroidism.   Significant Hospital Events: Including procedures, antibiotic start and stop dates in addition to other pertinent events   7/2 admit to ICU post-op, ventilated  7/3 pt remains on low dose neo, weaned off sedation but mental status still precludes SBT  Interim History / Subjective:   Remains on the ventilator and pressors  Objective    Blood pressure (!) 114/50, pulse 80, temperature 98.3 F (36.8 C), temperature source Axillary, resp. rate (!) 24, height 5' 4 (1.626 m), weight 72.6 kg, SpO2 98%. CVP:  [7 mmHg-12 mmHg] 12 mmHg  Vent Mode: PRVC FiO2 (%):  [30 %-40 %] 30 % Set Rate:  [24 bmp-245 bmp] 24 bmp Vt Set:  [430 mL] 430 mL PEEP:  [5 cmH20] 5 cmH20 Plateau Pressure:  [17 cmH20-18 cmH20] 17 cmH20   Intake/Output Summary (Last 24 hours) at 03/03/2024 0731 Last data filed at 03/03/2024 9346 Gross per 24 hour   Intake 4271.83 ml  Output 2135 ml  Net 2136.83 ml   Filed Weights   02/29/24 0553 03/01/24 1127 03/01/24 1823  Weight: 64.4 kg 64 kg 72.6 kg   Examination: Gen:      No acute distress, elderly, chronically ill-appearing HEENT:  EOMI, sclera anicteric, ET tube Neck:     No masses; no thyromegaly Lungs:    Clear to auscultation bilaterally; normal respiratory effort CV:         Regular rate and rhythm; no murmurs Abd:      Ostomy, JP drain, surgical incision clean and dry Ext:    No edema; adequate peripheral perfusion Neuro: Sedated  Labs/imaging reviewed Significant for sodium 133, potassium 2.4, BUN/creatinine 22/2.07 WBC 5.1, hemoglobin 13.9, platelets 79 Lactic acid 3.1 No new imaging  Resolved problem list   Assessment and Plan    Post-op ventilator management  Unable to wean due to mental status Cut down sedation and SBT's as tolerated Follow intermittent chest x-ray, ABG   S/p partial colectomy and ileostomy  Septic shock  Continue Zosyn  Trend lactic acid Wean down pressors as tolerated   Hypokalemia  Hypomagnesemia  AKI on CKD, baseline creatinine 1.20 Trend urine output.  No indication for dialysis at present   Acute Thrombocytopenia  Platelets 97 down from 104 yesterday -no signs of active bleeding and hemoglobin is stable -continue to trend and transfuse prn   Type 2 DM  -SSI      Best Practice (right click and Reselect all SmartList Selections daily)  Diet/type: NPO DVT prophylaxis SCD Pressure ulcer(s): N/A GI prophylaxis: PPI Lines: Central line Foley:  Yes, and it is still needed Code Status:  full code Last date of multidisciplinary goals of care discussion [per primary]  Critical care time:     The patient is critically ill with multiple organ system failure and requires high complexity decision making for assessment and support, frequent evaluation and titration of therapies, advanced monitoring, review of radiographic  studies and interpretation of complex data.   Critical Care Time devoted to patient care services, exclusive of separately billable procedures, described in this note is 35 minutes.   Elmina Hendel MD [facility] See Amion for pager  If no response to pager , please call [phone] until 7pm After 7:00 pm call Elink  [phone] 03/03/2024, 7:41 AM

## 2024-03-03 NOTE — Plan of Care (Signed)
  Problem: Clinical Measurements: Goal: Ability to maintain clinical measurements within normal limits will improve Outcome: Progressing Goal: Will remain free from infection Outcome: Progressing Goal: Diagnostic test results will improve Outcome: Not Progressing Goal: Respiratory complications will improve Outcome: Progressing Goal: Cardiovascular complication will be avoided Outcome: Progressing   Problem: Elimination: Goal: Will not experience complications related to urinary retention Outcome: Progressing   Problem: Activity: Goal: Ability to tolerate increased activity will improve Outcome: Progressing   Problem: Respiratory: Goal: Ability to maintain a clear airway and adequate ventilation will improve Outcome: Progressing

## 2024-03-03 NOTE — Progress Notes (Signed)
 Hypoglycemic Events  0730 CBG was 62.  Patient is intubated and NPO.  Gave 12.5 g D50 via PIV. Next CBG was 103.  1534 CBG was 56. Patient is still NPO.  Gave 12.5 g D50 via PIV. Next CBG was 70.  Gave another 12.5 g D50 via PIV.  Notified MD of two hypoglycemic events during the day.  Got order for D5LR at 75 ml/hr continuous.  Delon Croak, RN

## 2024-03-03 NOTE — Progress Notes (Signed)
 eLink Physician-Brief Progress Note Patient Name: Alexandria Durham DOB: [birth year] MRN: [id number]   Date of Service  03/03/2024  HPI/Events of Note  Patient has SubQ heparin  ordered, but platelets are 79 extubated so she no longer has an OG tube for medications-> asking if we are doing sips with meds, or switching to IV tylenol  or skipping everything?  eICU Interventions  Continue SQ heparin  for platelets >50 Hold all enteral meds for the time being   0058 -given lack of enteral access, having difficulty controlling patient's pain.  Transition to Dilaudid  sliding scale IV per pain scale  0520 - worsening thrombocytopenia, hold heparin   Intervention Category Minor Interventions: Routine modifications to care plan (e.g. PRN medications for pain, fever)  Shantae Vantol 03/03/2024, 8:14 PM

## 2024-03-03 NOTE — Progress Notes (Addendum)
 eLink Physician-Brief Progress Note Patient Name: MERYN SARRACINO DOB: 1946-07-07 MRN: [id number]   Date of Service  03/03/2024  HPI/Events of Note  Patient is having some bleeding-rectal and vaginal.  Status post partial colectomy with colostomy  Arterial line positional but phenylephrine  requirements increasing somewhat.  Large discrepancy with cuff.  Able to switch nearly all meds to per tube except Wellbutrin   eICU Interventions  Continue routine labs, surgical team following.  Minimal increase in phenylephrine , continue observation for now  Hold Wellbutrin  for now   0543 -worsening anion gap metabolic acidosis with downtrending lactic acid and stable azotemia.  Check beta hydroxybutyrate.  Replace electrolytes  Intervention Category Minor Interventions: Routine modifications to care plan (e.g. PRN medications for pain, fever)  Ascencion Coye 03/03/2024, 4:55 AM

## 2024-03-03 NOTE — Plan of Care (Signed)
  Problem: Clinical Measurements: Goal: Will remain free from infection Outcome: Progressing Goal: Diagnostic test results will improve Outcome: Progressing Goal: Respiratory complications will improve Outcome: Progressing Goal: Cardiovascular complication will be avoided Outcome: Progressing   Problem: Elimination: Goal: Will not experience complications related to bowel motility Outcome: Progressing Goal: Will not experience complications related to urinary retention Outcome: Progressing   Problem: Safety: Goal: Ability to remain free from injury will improve Outcome: Progressing   Problem: Skin Integrity: Goal: Risk for impaired skin integrity will decrease Outcome: Progressing

## 2024-03-03 NOTE — Progress Notes (Signed)
 Extubated patient to 2 lpm nasal cannual. SPO2 99%, patient is currently resting.

## 2024-03-03 NOTE — Progress Notes (Signed)
 GYN Oncology Progress Note  2 Days Post-Op Procedure(s) (LRB): LAPAROTOMY, EXPLORATORY (N/A) SIGMOIDOSCOPY COLECTOMY, WITH COLOSTOMY CREATION MOBILIZATION, SPLENIC FLEXURE, WITH PARTIAL COLECTOMY CYSTOSCOPY  Above procedure performed after recent D&C procedure on 02/29/2024 for thickened endometrium. D&C procedure complicated by bowel perforation. Patient taken back to the OR on 03/01/24 after developing signs of sepsis,shock (hypotension, tachycardia, peritonitis symptoms). She was started on zosyn  on 03/01/2024 and this has been continued. She was admitted to ICU post-op on ventilation.   Subjective: Patient's nursing team is at bedside.  Patient arouses to touch and her name.  Per nursing, maroon bowel movement per rectum overnight.  Also per nursing, A-line suspected to be malfunctioning with large discrepancy between A-line and cuff with appropriate increase in blood pressure with stimulation and activity from the cuff without change in A-line.  Objective: Vital signs in last 24 hours: Temp:  [97.7 F (36.5 C)-98.4 F (36.9 C)] 98.1 F (36.7 C) (07/04 0800) Pulse Rate:  [69-86] 80 (07/04 0645) Resp:  [19-26] 24 (07/04 0645) BP: (95-189)/(45-102) 114/50 (07/04 0645) SpO2:  [94 %-100 %] 98 % (07/04 0645) Arterial Line BP: (67-147)/(40-86) 73/52 (07/04 0645) FiO2 (%):  [30 %-40 %] 30 % (07/04 0400) Last BM Date : 03/03/24  Intake/Output from previous day: 07/03 0701 - 07/04 0700 In: 4274.9 [I.V.:3516.9; IV Piggyback:758] Out: 2135 [Urine:950; Emesis/NG output:850; Drains:335]  Physical Examination (performed by Dr. Viktoria): General: currently intubated, opening eyes intermittently, arouses to touch and name Resp: currently on vent Cardio: regular in rate and rhythm GI: incision: midline abdominal incision with wound VAC dressing in place connected to suction and abdomen soft, non-distended, JP drain with serosanguinous drainage, ostomy with small amount of maroon liquid output,  ostomy bag removed and stoma inspected.  Some darkening around the edges but the central mucosa pink and appropriate in appearance.   Extremities: extremities normal, atraumatic, no cyanosis or edema and SCDs in place Foley in place with clear yellow urine  Labs: WBC/Hgb/Hct/Plts:  5.1/13.9/40.1/79 (07/04 0406) BUN/Cr/glu/ALT/AST/amyl/lip:  22/2.07/--/--/--/--/-- (07/04 0406)  Assessment: 78 y.o. s/p Procedure(s): LAPAROTOMY, EXPLORATORY, SIGMOIDOSCOPY, COLECTOMY, WITH COLOSTOMY CREATION, MOBILIZATION, SPLENIC FLEXURE, WITH PARTIAL COLECTOMY, CYSTOSCOPY: stable Pain:  On scheduled medications-fentanyl .  Heme: Hgb 13.9 this am. Continue to monitor.  Status post 2 units in the operating room.  ID: Currently on zosyn  for bowel perforation from recent D&C. S/P exploratory laparotomy with colectomy and ostomy creation. WBC 5.1 this am.  Afebrile.  CV: On monitoring. BP and HR overall stable at this time. PLT 79 this am.   GI:  Diet: NPO, currently on vent. Has small amount of maroon liquid drainage in ostomy. Stoma is edematous but with pink mucosa as above.  OG tube in place  GU: Creatinine 2.07.  Improving urine output.  On IVF resuscitation.   FEN: Am metabolic panel reviewed. Repletion as needed.    Endo: Diabetes. CBG (last 3)  Recent Labs    03/02/24 2313 03/03/24 0404 03/03/24 0730  GLUCAP 86 80 62*     Prophylaxis: SCDs, heparin  ordered.  Plan: Continue with current plan of care per Critical Care. Appreciate management. Overall improving urine output and heart rate.  Increasing patient alertness.  Overall feel that these are reassuring signs of clinical improvement and does not seem to correlate with the A-line.  Will defer A-line to critical care team but okay to remove if we feel that this is no longer providing accurate information. Did have stool in her distal colon so may continue have a small amount  of bowel production from her rectum prior to her distal end being  emptied.  Ostomy examined and noted to have pink mucosa.  Monitor for bowel function.  High risk for ileus. Continue IV antibiotics at this time until more clinically stable.   LOS: 3 days    Alexandria Durham 03/03/2024, 8:03 AM

## 2024-03-04 DIAGNOSIS — A419 Sepsis, unspecified organism: Secondary | ICD-10-CM | POA: Diagnosis not present

## 2024-03-04 DIAGNOSIS — E876 Hypokalemia: Secondary | ICD-10-CM | POA: Diagnosis not present

## 2024-03-04 DIAGNOSIS — R6521 Severe sepsis with septic shock: Secondary | ICD-10-CM | POA: Diagnosis not present

## 2024-03-04 DIAGNOSIS — N189 Chronic kidney disease, unspecified: Secondary | ICD-10-CM | POA: Diagnosis not present

## 2024-03-04 LAB — BLOOD GAS, ARTERIAL
Acid-base deficit: 9.6 mmol/L — ABNORMAL HIGH (ref 0.0–2.0)
Bicarbonate: 14.6 mmol/L — ABNORMAL LOW (ref 20.0–28.0)
Drawn by: 27027
FIO2: 100 %
MECHVT: 430 mL
O2 Saturation: 100 %
PEEP: 5 cmH2O
Patient temperature: 37.1
RATE: 24 {breaths}/min
pCO2 arterial: 27 mmHg — ABNORMAL LOW (ref 32–48)
pH, Arterial: 7.34 — ABNORMAL LOW (ref 7.35–7.45)
pO2, Arterial: 197 mmHg — ABNORMAL HIGH (ref 83–108)

## 2024-03-04 LAB — CBC
HCT: 35.4 % — ABNORMAL LOW (ref 36.0–46.0)
Hemoglobin: 11.9 g/dL — ABNORMAL LOW (ref 12.0–15.0)
MCH: 29.8 pg (ref 26.0–34.0)
MCHC: 33.6 g/dL (ref 30.0–36.0)
MCV: 88.7 fL (ref 80.0–100.0)
Platelets: 55 K/uL — ABNORMAL LOW (ref 150–400)
RBC: 3.99 MIL/uL (ref 3.87–5.11)
RDW: 17.1 % — ABNORMAL HIGH (ref 11.5–15.5)
WBC: 6.7 K/uL (ref 4.0–10.5)
nRBC: 0 % (ref 0.0–0.2)

## 2024-03-04 LAB — COMPREHENSIVE METABOLIC PANEL WITH GFR
ALT: 209 U/L — ABNORMAL HIGH (ref 0–44)
AST: 106 U/L — ABNORMAL HIGH (ref 15–41)
Albumin: 2.2 g/dL — ABNORMAL LOW (ref 3.5–5.0)
Alkaline Phosphatase: 65 U/L (ref 38–126)
Anion gap: 12 (ref 5–15)
BUN: 21 mg/dL (ref 8–23)
CO2: 18 mmol/L — ABNORMAL LOW (ref 22–32)
Calcium: 7.9 mg/dL — ABNORMAL LOW (ref 8.9–10.3)
Chloride: 107 mmol/L (ref 98–111)
Creatinine, Ser: 1.75 mg/dL — ABNORMAL HIGH (ref 0.44–1.00)
GFR, Estimated: 30 mL/min — ABNORMAL LOW (ref >60)
Glucose, Bld: 95 mg/dL (ref 70–99)
Potassium: 3.4 mmol/L — ABNORMAL LOW (ref 3.5–5.1)
Sodium: 137 mmol/L (ref 135–145)
Total Bilirubin: 1.3 mg/dL — ABNORMAL HIGH (ref 0.0–1.2)
Total Protein: 4.5 g/dL — ABNORMAL LOW (ref 6.5–8.1)

## 2024-03-04 LAB — GLUCOSE, CAPILLARY
Glucose-Capillary: 69 mg/dL — ABNORMAL LOW (ref 70–99)
Glucose-Capillary: 76 mg/dL (ref 70–99)
Glucose-Capillary: 82 mg/dL (ref 70–99)
Glucose-Capillary: 91 mg/dL (ref 70–99)
Glucose-Capillary: 92 mg/dL (ref 70–99)
Glucose-Capillary: 95 mg/dL (ref 70–99)

## 2024-03-04 MED ORDER — HYDROMORPHONE HCL 1 MG/ML IJ SOLN: 0.5000 mg | INTRAMUSCULAR | Status: DC | PRN

## 2024-03-04 MED ORDER — KCL IN DEXTROSE-NACL 10-5-0.45 MEQ/L-%-% IV SOLN
INTRAVENOUS | Status: DC
  Filled 2024-03-04 (×3): qty 1000

## 2024-03-04 MED ORDER — HYDROMORPHONE HCL 1 MG/ML IJ SOLN
1.0000 mg | INTRAMUSCULAR | Status: DC | PRN
  Administered 2024-03-05 – 2024-03-06 (×3): 1 mg via INTRAVENOUS
  Filled 2024-03-04 (×5): qty 1

## 2024-03-04 MED ORDER — ACETAMINOPHEN 10 MG/ML IV SOLN
1000.0000 mg | Freq: Four times a day (QID) | INTRAVENOUS | Status: AC
  Administered 2024-03-04 – 2024-03-05 (×4): 1000 mg via INTRAVENOUS
  Filled 2024-03-04 (×4): qty 100

## 2024-03-04 MED ORDER — HYDROMORPHONE HCL 1 MG/ML IJ SOLN
2.0000 mg | INTRAMUSCULAR | Status: DC | PRN
  Administered 2024-03-04 – 2024-03-06 (×10): 2 mg via INTRAVENOUS
  Filled 2024-03-04 (×9): qty 2

## 2024-03-04 MED ORDER — HYDROMORPHONE HCL 1 MG/ML IJ SOLN
1.0000 mg | INTRAMUSCULAR | Status: DC | PRN
  Administered 2024-03-04 (×3): 1 mg via INTRAVENOUS
  Filled 2024-03-04 (×2): qty 1

## 2024-03-04 NOTE — Plan of Care (Signed)
  Problem: Clinical Measurements: Goal: Will remain free from infection Outcome: Progressing Goal: Diagnostic test results will improve Outcome: Progressing Goal: Respiratory complications will improve Outcome: Progressing Goal: Cardiovascular complication will be avoided Outcome: Progressing   Problem: Nutrition: Goal: Adequate nutrition will be maintained Outcome: Not Progressing   Problem: Elimination: Goal: Will not experience complications related to urinary retention Outcome: Not Progressing   Problem: Metabolic: Goal: Ability to maintain appropriate glucose levels will improve Outcome: Not Progressing   Problem: Nutritional: Goal: Maintenance of adequate nutrition will improve Outcome: Not Progressing   Problem: Respiratory: Goal: Ability to maintain a clear airway and adequate ventilation will improve Outcome: Progressing   Problem: Role Relationship: Goal: Method of communication will improve Outcome: Not Progressing

## 2024-03-04 NOTE — Consult Note (Addendum)
 WOC team consulted for new end colostomy and NPWT management placed by Dr. [name] 03/01/2024.    WOC team will see Monday 03/06/2024 for education and support of ostomy as well as NPWT change.   Thank you,    Powell Bar MSN, RN-BC, [employer]

## 2024-03-04 NOTE — Progress Notes (Signed)
 GYN Oncology Progress Note  3 Days Post-Op Procedure(s) (LRB): LAPAROTOMY, EXPLORATORY (N/A) SIGMOIDOSCOPY COLECTOMY, WITH COLOSTOMY CREATION MOBILIZATION, SPLENIC FLEXURE, WITH PARTIAL COLECTOMY CYSTOSCOPY  Above procedure performed after recent D&C procedure on 02/29/2024 for thickened endometrium. D&C procedure complicated by bowel perforation. Patient taken back to the OR on 03/01/24 after developing signs of sepsis,shock (hypotension, tachycardia, peritonitis symptoms). She was started on zosyn  on 03/01/2024 and this has been continued. She was admitted to ICU post-op on ventilation. Now extubated   Subjective: Patient extubated yesterday. Sleepy today but arouses to name and touch. Indicates pain in her abdomen.    Objective: Vital signs in last 24 hours: Temp:  [97.6 F (36.4 C)-98.6 F (37 C)] 98.6 F (37 C) (07/05 0400) Pulse Rate:  [78-103] 102 (07/05 0700) Resp:  [13-25] 14 (07/05 0700) BP: (76-157)/(13-87) 129/44 (07/05 0700) SpO2:  [92 %-100 %] 95 % (07/05 0700) FiO2 (%):  [30 %] 30 % (07/04 1000) Last BM Date : 03/03/24  Intake/Output from previous day: 07/04 0701 - 07/05 0700 In: 2531.4 [I.V.:2159.3; IV Piggyback:372.1] Out: 1365 [Urine:850; Emesis/NG output:300; Drains:185; Stool:30]  Physical Examination: General: Sleepy, not talking but some moans with palpation of abdomen Resp: Normal work of breathing Cardio: Sinus tachycardia GI: incision: midline abdominal incision with wound VAC dressing in place connected to suction and abdomen soft, non-distended, TTP, JP drain with serosanguinous drainage, ostomy with small amount of bowel sweat, ostomy bag removed and stoma inspected.  Some darkening around the edges but the central mucosa pink and appropriate in appearance. No gas in bag Extremities: extremities normal, atraumatic, no cyanosis or edema and SCDs in place Foley in place with clear yellow urine  Labs: WBC/Hgb/Hct/Plts:  6.7/11.9/35.4/55 (07/05 0413)  BUN/Cr/glu/ALT/AST/amyl/lip:  21/1.75/--/209/106/--/-- (07/05 0413)  Assessment: 78 y.o. s/p Procedure(s): LAPAROTOMY, EXPLORATORY, SIGMOIDOSCOPY, COLECTOMY, WITH COLOSTOMY CREATION, MOBILIZATION, SPLENIC FLEXURE, WITH PARTIAL COLECTOMY, CYSTOSCOPY: stable Pain:  On scheduled medications-fentanyl .  Heme: Hgb 11.9 this am. Continue to monitor.  Status post 2 units in the operating room.  ID: Currently on zosyn  for bowel perforation from recent D&C. S/P exploratory laparotomy with colectomy and ostomy creation. WBC 6.7 this am.  Afebrile.  CV: On monitoring. BP and HR overall stable at this time. PLT 55 this am.   GI:  Diet: NPO. On mIVF. Has small amount of bowel sweat in ostomy, no gas. Stoma is edematous but with pink mucosa as above.   GU: Creatinine 1.75.  Improving.  On mIVF while NPO.   FEN: Am metabolic panel reviewed. Repletion as needed.    Endo: Diabetes. CBG (last 3)  Recent Labs    03/04/24 0006 03/04/24 0351 03/04/24 0728  GLUCAP 76 91 69*     Prophylaxis: SCDs, heparin  ordered.  Plan: Continue with current plan of care per Critical Care. Appreciate management. Extubated yesterday, stable on room air Improvement in creatinine, stable UOP. Challenges with pain control. Switched to IV tylenol  and IV dilaudid  Continue NPO until gas in ostomy. High risk for ileus. WOCN consult. Continue IV antibiotics at this time until more clinically stable. Will likely transition to PO antibiotics once taking in PO.    LOS: 4 days    Alexandria Durham 03/04/2024, 8:01 AM

## 2024-03-04 NOTE — Plan of Care (Signed)

## 2024-03-04 NOTE — Progress Notes (Signed)
 eLink Physician-Brief Progress Note Patient Name: QUETZALLY CALLAS DOB: 1945-10-09 MRN: [id number]   Date of Service  03/04/2024  HPI/Events of Note  RN reports crackles on lung auscultation.  Asking for possible neb treatments and whether or not an x-ray is indicated.  eICU Interventions  Patient's chart reviewed.  Pertinent labs and imaging studies reviewed.  Video assessment of patient done with RN at bedside.  Oxygen saturation is 98% on 2 L.  Per RN, patient is not wheezing and on my exam she does not have any visible signs of dyspnea. Bronchodilator treatment is not indicated and with her good oxygen saturations on 2 L oxygen, there is not an indication for chest x-ray now.  If she should start desaturating, 1 will be ordered.     Intervention Category Intermediate Interventions: Other: (Change in lung exam)  Jerilynn Berg 03/04/2024, 9:55 PM

## 2024-03-04 NOTE — Progress Notes (Signed)
 PT Cancellation Note  Patient Details Name: Alexandria Durham MRN: [id number] DOB: 1946/08/23   Cancelled Treatment:     PT order received but eval deferred this date at request of RN 2* pt elevated pain level.  Will follow.   [name] 03/04/2024, 11:21 AM

## 2024-03-04 NOTE — Progress Notes (Signed)
 NAME:  Alexandria Durham, MRN:  [id number], DOB:  10-11-1945, LOS: 4 ADMISSION DATE:  02/29/2024, CONSULTATION DATE:  03/04/24 REFERRING MD:  [name], CHIEF COMPLAINT:  post-op ventilator management    History of Present Illness:   Alexandria Durham is a 78 y.o.  F with PMH significant for thickened endometrium, HTN, Type 2 DM, CKD stage IIIa, recent pancreatitis and acute cholecystitis s/p lap chole and D&C in May of this year who was admitted 7/1 for hysteroscopy and repeat D&C.  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 Pt was acidotic post-procedure, so decision made to leave intubated, PCCM consulted in this setting.   Pertinent  Medical History   has a past medical history of Arthritis, Chronic kidney disease, COPD (chronic obstructive pulmonary disease) (HCC), Diabetes mellitus, Family history of adverse reaction to anesthesia, GERD (gastroesophageal reflux disease), Heart murmur, Hypercholesteremia, Hypertension, and Hypothyroidism.   Significant Hospital Events: Including procedures, antibiotic start and stop dates in addition to other pertinent events   7/2 admit to ICU post-op, ventilated  7/3 pt remains on low dose neo, weaned off sedation but mental status still precludes SBT 7/4 extubated and weaned off pressors  Interim History / Subjective:   Extubated without issue.  On room air now, off pressors  Objective    Blood pressure (!) 129/44, pulse (!) 102, temperature 98 F (36.7 C), temperature source Axillary, resp. rate 14, height 5' 4 (1.626 m), weight 72.6 kg, SpO2 95%.    Vent Mode: PRVC FiO2 (%):  [30 %] 30 % Set Rate:  [24 bmp] 24 bmp Vt Set:  [430 mL] 430 mL PEEP:  [5 cmH20] 5 cmH20 Plateau Pressure:  [19 cmH20] 19 cmH20   Intake/Output Summary (Last 24 hours) at 03/04/2024 9187 Last data filed at 03/04/2024 9261 Gross per 24  hour  Intake 2695.76 ml  Output 1065 ml  Net 1630.76 ml   Filed Weights   02/29/24 0553 03/01/24 1127 03/01/24 1823  Weight: 64.4 kg 64 kg 72.6 kg   Examination: Gen:      No acute distress HEENT:  EOMI, sclera anicteric Neck:     No masses; no thyromegaly Lungs:    Clear to auscultation bilaterally; normal respiratory effort CV:         Regular rate and rhythm; no murmurs Abd:      Ostomy, JP drain Ext:    No edema; adequate peripheral perfusion Neuro: Somnolent, arousable  Labs/imaging reviewed Significant for potassium 3.4 BUN/creatinine 21/1.75 AST 106, ALT 209, hemoglobin 11.9, platelets 55 No new imaging  Resolved problem list   Assessment and Plan  Post-op ventilator management  Stable postextubation, on room air Incentive spirometer  S/p partial colectomy and ileostomy  Septic shock  Continue Zosyn  Off pressors  Hypokalemia  Hypomagnesemia  AKI on CKD, baseline creatinine 1.20 Trend urine output.  Creatinine improved today  Acute Thrombocytopenia  Platelets down to 55 -no signs of active bleeding and hemoglobin is stable -continue to trend and transfuse prn - Off subcutaneous heparin  for DVT prophylaxis  Type 2 DM  -SSI   Best Practice (right click and Reselect all SmartList Selections daily)   Diet/type: NPO DVT prophylaxis SCD Pressure ulcer(s): N/A GI prophylaxis: PPI Lines: Central line Foley:  Yes, and it is still needed Code Status:  full code Last date of multidisciplinary goals of care  discussion [per primary]  Critical care time:     The patient is critically ill with multiple organ system failure and requires high complexity decision making for assessment and support, frequent evaluation and titration of therapies, advanced monitoring, review of radiographic studies and interpretation of complex data.   Critical Care Time devoted to patient care services, exclusive of separately billable procedures, described in this note is 35  minutes.   Jaylin Benzel MD [facility] See Amion for pager  If no response to pager , please call [phone] until 7pm After 7:00 pm call Elink  [phone] 03/04/2024, 8:12 AM

## 2024-03-05 ENCOUNTER — Inpatient Hospital Stay (HOSPITAL_COMMUNITY)

## 2024-03-05 DIAGNOSIS — E876 Hypokalemia: Secondary | ICD-10-CM | POA: Diagnosis not present

## 2024-03-05 DIAGNOSIS — N189 Chronic kidney disease, unspecified: Secondary | ICD-10-CM | POA: Diagnosis not present

## 2024-03-05 DIAGNOSIS — R6521 Severe sepsis with septic shock: Secondary | ICD-10-CM | POA: Diagnosis not present

## 2024-03-05 DIAGNOSIS — A419 Sepsis, unspecified organism: Secondary | ICD-10-CM | POA: Diagnosis not present

## 2024-03-05 LAB — BASIC METABOLIC PANEL WITH GFR
Anion gap: 9 (ref 5–15)
BUN: 17 mg/dL (ref 8–23)
CO2: 18 mmol/L — ABNORMAL LOW (ref 22–32)
Calcium: 7.3 mg/dL — ABNORMAL LOW (ref 8.9–10.3)
Chloride: 106 mmol/L (ref 98–111)
Creatinine, Ser: 1.32 mg/dL — ABNORMAL HIGH (ref 0.44–1.00)
GFR, Estimated: 42 mL/min — ABNORMAL LOW (ref >60)
Glucose, Bld: 150 mg/dL — ABNORMAL HIGH (ref 70–99)
Potassium: 3.3 mmol/L — ABNORMAL LOW (ref 3.5–5.1)
Sodium: 133 mmol/L — ABNORMAL LOW (ref 135–145)

## 2024-03-05 LAB — GLUCOSE, CAPILLARY
Glucose-Capillary: 108 mg/dL — ABNORMAL HIGH (ref 70–99)
Glucose-Capillary: 117 mg/dL — ABNORMAL HIGH (ref 70–99)
Glucose-Capillary: 132 mg/dL — ABNORMAL HIGH (ref 70–99)
Glucose-Capillary: 65 mg/dL — ABNORMAL LOW (ref 70–99)
Glucose-Capillary: 74 mg/dL (ref 70–99)
Glucose-Capillary: 94 mg/dL (ref 70–99)
Glucose-Capillary: 97 mg/dL (ref 70–99)

## 2024-03-05 LAB — CBC
HCT: 34.9 % — ABNORMAL LOW (ref 36.0–46.0)
Hemoglobin: 11.7 g/dL — ABNORMAL LOW (ref 12.0–15.0)
MCH: 29.8 pg (ref 26.0–34.0)
MCHC: 33.5 g/dL (ref 30.0–36.0)
MCV: 88.8 fL (ref 80.0–100.0)
Platelets: 55 K/uL — ABNORMAL LOW (ref 150–400)
RBC: 3.93 MIL/uL (ref 3.87–5.11)
RDW: 17.6 % — ABNORMAL HIGH (ref 11.5–15.5)
WBC: 16.3 K/uL — ABNORMAL HIGH (ref 4.0–10.5)
nRBC: 0.1 % (ref 0.0–0.2)

## 2024-03-05 LAB — TROPONIN I (HIGH SENSITIVITY)
Troponin I (High Sensitivity): 22 ng/L — ABNORMAL HIGH (ref <18)
Troponin I (High Sensitivity): 22 ng/L — ABNORMAL HIGH (ref <18)
Troponin I (High Sensitivity): 22 ng/L — ABNORMAL HIGH (ref <18)

## 2024-03-05 MED ORDER — ALTEPLASE 2 MG IJ SOLR
2.0000 mg | Freq: Once | INTRAMUSCULAR | Status: AC
  Administered 2024-03-05: 2 mg
  Filled 2024-03-05: qty 2

## 2024-03-05 MED ORDER — POTASSIUM CHLORIDE 2 MEQ/ML IV SOLN
INTRAVENOUS | Status: DC
  Filled 2024-03-05 (×4): qty 1000

## 2024-03-05 MED ORDER — POTASSIUM CHLORIDE 10 MEQ/100ML IV SOLN
10.0000 meq | INTRAVENOUS | Status: AC
  Administered 2024-03-05 (×2): 10 meq via INTRAVENOUS
  Filled 2024-03-05 (×2): qty 100

## 2024-03-05 MED ORDER — DEXTROSE 50 % IV SOLN
12.5000 g | INTRAVENOUS | Status: AC
  Administered 2024-03-05: 12.5 g via INTRAVENOUS
  Filled 2024-03-05: qty 50

## 2024-03-05 MED ORDER — LIDOCAINE 5 % EX PTCH
1.0000 | MEDICATED_PATCH | CUTANEOUS | Status: DC
  Administered 2024-03-05 – 2024-04-03 (×28): 1 via TRANSDERMAL
  Filled 2024-03-05 (×26): qty 1

## 2024-03-05 MED ORDER — KCL-LACTATED RINGERS-D5W 20 MEQ/L IV SOLN
INTRAVENOUS | Status: DC
  Filled 2024-03-05: qty 1000

## 2024-03-05 NOTE — TOC Progression Note (Addendum)
 Transition of Care [facility]) - Progression Note    Patient Details  Name: ARIAN MCQUITTY MRN: [id number] Date of Birth: 08/08/1946  Transition of [facility]) CM/SW Contact  Sonda Manuella Quill, RN Phone Number: 03/05/2024, 8:43 AM  Clinical Narrative:    Pt has NPWT; not medically ready for d/c; also awaiting PT eval; TOC is following.   Expected Discharge Plan: Home/Self Care Barriers to Discharge: Continued Medical Work up  Expected Discharge Plan and Services In-house Referral: NA Discharge Planning Services: CM Consult Post Acute Care Choice: NA Living arrangements for the past 2 months: Single Family Home                 DME Arranged: N/A DME Agency: NA       HH Arranged: NA HH Agency: NA         Social Determinants of Health (SDOH) Interventions SDOH Screenings   Food Insecurity: No Food Insecurity (02/29/2024)  Housing: High Risk (02/29/2024)  Transportation Needs: No Transportation Needs (02/29/2024)  Utilities: Not At Risk (02/29/2024)  Recent Concern: Utilities - At Risk (02/09/2024)  Social Connections: Patient Declined (02/29/2024)  Tobacco Use: Medium Risk (03/01/2024)    Readmission Risk Interventions    03/02/2024    3:05 PM  Readmission Risk Prevention Plan  Transportation Screening Complete  PCP or Specialist Appt within 3-5 Days Complete  HRI or Home Care Consult Complete  Social Work Consult for Recovery Care Planning/Counseling Complete  Palliative Care Screening Not Applicable  Medication Review [employer]) Complete

## 2024-03-05 NOTE — Progress Notes (Deleted)
 Contacted blood bank. Notified that patient has antibodies and they are working on crossmatch still.

## 2024-03-05 NOTE — Progress Notes (Signed)
 NAME:  BARBARA AHART, MRN:  [id number], DOB:  1946-04-23, LOS: 5 ADMISSION DATE:  02/29/2024, CONSULTATION DATE:  03/05/24 REFERRING MD:  [name], CHIEF COMPLAINT:  post-op ventilator management    History of Present Illness:   Alexandria Durham is a 79 y.o.  F with PMH significant for thickened endometrium, HTN, Type 2 DM, CKD stage IIIa, recent pancreatitis and acute cholecystitis s/p lap chole and D&C in May of this year who was admitted 7/1 for hysteroscopy and repeat D&C.  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 Pt was acidotic post-procedure, so decision made to leave intubated, PCCM consulted in this setting.   Pertinent  Medical History   has a past medical history of Arthritis, Chronic kidney disease, COPD (chronic obstructive pulmonary disease) (HCC), Diabetes mellitus, Family history of adverse reaction to anesthesia, GERD (gastroesophageal reflux disease), Heart murmur, Hypercholesteremia, Hypertension, and Hypothyroidism.   Significant Hospital Events: Including procedures, antibiotic start and stop dates in addition to other pertinent events   7/2 admit to ICU post-op, ventilated  7/3 pt remains on low dose neo, weaned off sedation but mental status still precludes SBT 7/4 extubated and weaned off pressors  Interim History / Subjective:   No acute events overnight.  Remains off pressors Reported chest pressure and congestion overnight  Objective    Blood pressure (!) 152/57, pulse 96, temperature (!) 97.4 F (36.3 C), temperature source Oral, resp. rate 10, height 5' 4 (1.626 m), weight 72.6 kg, SpO2 96%.        Intake/Output Summary (Last 24 hours) at 03/05/2024 0741 Last data filed at 03/05/2024 0659 Gross per 24 hour  Intake 2798.67 ml  Output 1165 ml  Net 1633.67 ml   Filed Weights   02/29/24 0553 03/01/24 1127 03/01/24  1823  Weight: 64.4 kg 64 kg 72.6 kg   Examination: Blood pressure (!) 152/57, pulse 96, temperature (!) 97.4 F (36.3 C), temperature source Oral, resp. rate 10, height 5' 4 (1.626 m), weight 72.6 kg, SpO2 96%. Gen:      No acute distress HEENT:  EOMI, sclera anicteric Neck:     No masses; no thyromegaly Lungs:    Clear to auscultation bilaterally; normal respiratory effort CV:         Regular rate and rhythm; no murmurs Abd:     Ostomy, JP drain Ext:    No edema; adequate peripheral perfusion Neuro: alert and oriented x 3 Psych: normal mood and affect   Labs/imaging reviewed. Significant for  Sodium 133, potassium 3.3 BUN/creatinine 17/1.32 WBC 16.3, hemoglobin 11.7, platelets 55  Resolved problem list   Assessment and Plan  Post-op ventilator management  Stable postextubation, on room air Incentive spirometer Get CXR, EKG and troponin  S/p partial colectomy and ileostomy  Septic shock  WBC count higher today. Monitor CBC Continue Zosyn . May need repeat abd imaging Off pressors  Hypokalemia  Hypomagnesemia  AKI on CKD, baseline creatinine 1.20 Trend urine output.  Creatinine improved today  Acute Thrombocytopenia  Platelets down to 55 No signs of active bleeding and hemoglobin is stable Continue to trend and transfuse prn Off subcutaneous heparin  due to thrombocytopenia  Type 2 DM  -SSI   Best Practice (right click and Reselect all SmartList Selections daily)   Diet/type: NPO. Consider TPN if not return of bowel movement DVT prophylaxis SCD Pressure ulcer(s): N/A  GI prophylaxis: PPI Lines: Central line Foley:  Yes, and it is still needed Code Status:  full code Last date of multidisciplinary goals of care discussion [per primary]  Critical care time:     The patient is critically ill with multiple organ system failure and requires high complexity decision making for assessment and support, frequent evaluation and titration of therapies, advanced  monitoring, review of radiographic studies and interpretation of complex data.   Critical Care Time devoted to patient care services, exclusive of separately billable procedures, described in this note is 35 minutes.   Shakeera Rightmyer MD  Pulmonary & Critical care See Amion for pager  If no response to pager , please call [phone] until 7pm After 7:00 pm call Elink  [phone] 03/05/2024, 7:41 AM

## 2024-03-05 NOTE — Plan of Care (Signed)
  Problem: Pain Managment: Goal: General experience of comfort will improve and/or be controlled Outcome: Progressing   Problem: Safety: Goal: Ability to remain free from injury will improve Outcome: Progressing   Problem: Skin Integrity: Goal: Risk for impaired skin integrity will decrease Outcome: Progressing   Problem: Nutrition: Goal: Adequate nutrition will be maintained Outcome: Not Progressing   Problem: Coping: Goal: Level of anxiety will decrease Outcome: Not Progressing

## 2024-03-05 NOTE — Plan of Care (Signed)
   Problem: Education: Goal: Knowledge of General Education information will improve Description: Including pain rating scale, medication(s)/side effects and non-pharmacologic comfort measures Outcome: Progressing   Problem: Clinical Measurements: Goal: Ability to maintain clinical measurements within normal limits will improve Outcome: Progressing

## 2024-03-05 NOTE — Progress Notes (Signed)
 GYN Oncology Progress Note  4 Days Post-Op Procedure(s) (LRB): LAPAROTOMY, EXPLORATORY (N/A) SIGMOIDOSCOPY COLECTOMY, WITH COLOSTOMY CREATION MOBILIZATION, SPLENIC FLEXURE, WITH PARTIAL COLECTOMY CYSTOSCOPY  Above procedure performed after recent D&C procedure on 02/29/2024 for thickened endometrium. D&C procedure complicated by bowel perforation. Patient taken back to the OR on 03/01/24 after developing signs of sepsis,shock (hypotension, tachycardia, peritonitis symptoms). She was started on zosyn  on 03/01/2024 and this has been continued. She was admitted to ICU post-op on ventilation. Now extubated   [name] Denies pain or nausea. Per nursing, she had reported difficulty breathing and pressure on her chest yesterday and was placed on oxygen with relief. Off of oxygen this morning. Nursing with report of likely some delirium.   Objective: Vital signs in last 24 hours: Temp:  [97.4 F (36.3 C)-98.3 F (36.8 C)] 97.4 F (36.3 C) (07/06 0400) Pulse Rate:  [92-103] 96 (07/06 0700) Resp:  [9-17] 10 (07/06 0700) BP: (130-174)/(43-100) 152/57 (07/06 0700) SpO2:  [93 %-99 %] 96 % (07/06 0700) Last BM Date : 03/04/24  Intake/Output from previous day: 07/05 0701 - 07/06 0700 In: 2963.1 [I.V.:2401.9; IV Piggyback:561.1] Out: 1165 [Urine:1125; Drains:40]  Physical Examination: General: Sleepy, and arousable to name and touch Resp: Normal work of breathing. Lungs with some scattered rhonchi Cardio: RRR GI: incision: midline abdominal incision with wound VAC dressing in place connected to suction and abdomen soft, non-distended, mild TTP, JP drain with serosanguinous drainage, ostomy with small amount of bowel sweat, ostomy bag removed and stoma inspected.  Some darkening around the superior edges but the central mucosa pink and appropriate in appearance. No gas in bag Extremities: extremities normal, atraumatic, SCDs in place, edema in bilateral  hands Foley in place with clear yellow urine  Labs: WBC/Hgb/Hct/Plts:  16.3/11.7/34.9/55 (07/06 0441) BUN/Cr/glu/ALT/AST/amyl/lip:  17/1.32/--/--/--/--/-- (07/06 0441)  Assessment: 78 y.o. s/p Procedure(s): LAPAROTOMY, EXPLORATORY, SIGMOIDOSCOPY, COLECTOMY, WITH COLOSTOMY CREATION, MOBILIZATION, SPLENIC FLEXURE, WITH PARTIAL COLECTOMY, CYSTOSCOPY: stable Pain:  On scheduled medications-fentanyl .  Heme: Hgb 11.7 this am, stable. Continue to monitor.  Status post 2 units in the operating room.  ID: WBC increased to 16.3 this AM. Afebrile. Currently on zosyn  for bowel perforation from recent D&C. S/P exploratory laparotomy with colectomy and ostomy creation.   CV: On monitoring. BP and HR overall stable at this time. PLT 55 this am, stable.  GI:  Diet: NPO. On mIVF. Has small amount of bowel sweat in ostomy, no gas. Stoma is edematous but with pink mucosa as above.   GU: Creatinine 1.32.  Improving.  On mIVF while NPO. Will remove foley catheter.  FEN: Am metabolic panel reviewed. Repletion as needed.    Endo: Diabetes. CBG (last 3)  Recent Labs    03/05/24 0038 03/05/24 0444 03/05/24 0750  GLUCAP 97 108* 117*     Prophylaxis: SCDs, heparin  held at this time due to thrombocytopenia.  Plan: Continue with current plan of care per Critical Care. Appreciate management. CXR and EKG this AM due to report of difficulty breathing, chest pressure yesterday; currently on room air. Elevated WBC but afebrile. Will follow-up CXR. Could be inflammatory in nature particularly given high risk of ileus. Continue to monitor. May consider CT a/p. Continue IV zosyn .  Improvement in creatinine, stable UOP. Remove foley and continue to monitor I&Os with purewick. Continue IV pain meds, seems to be improving pain control.  Continue NPO until gas in ostomy. High risk for ileus. May need TPN in near future. WOCN consult.  LOS: 5 days    Sarthak Rubenstein 03/05/2024, 8:36 AM

## 2024-03-05 NOTE — Progress Notes (Signed)
 Hypoglycemic Event  CBG: 65 @ 2016  Treatment: D50 12.5mg    Symptoms: none  Follow-up CBG: Time: 2054 CBG Result: 132  Possible Reasons for Event: pt is NPO, no enteral nutrition  Comments/MD notified: Elink made aware    Alexandria Durham

## 2024-03-06 ENCOUNTER — Other Ambulatory Visit: Payer: Self-pay

## 2024-03-06 DIAGNOSIS — A419 Sepsis, unspecified organism: Secondary | ICD-10-CM | POA: Diagnosis not present

## 2024-03-06 DIAGNOSIS — K631 Perforation of intestine (nontraumatic): Secondary | ICD-10-CM | POA: Diagnosis not present

## 2024-03-06 LAB — GLUCOSE, CAPILLARY
Glucose-Capillary: 106 mg/dL — ABNORMAL HIGH (ref 70–99)
Glucose-Capillary: 110 mg/dL — ABNORMAL HIGH (ref 70–99)
Glucose-Capillary: 122 mg/dL — ABNORMAL HIGH (ref 70–99)
Glucose-Capillary: 71 mg/dL (ref 70–99)
Glucose-Capillary: 80 mg/dL (ref 70–99)
Glucose-Capillary: 90 mg/dL (ref 70–99)
Glucose-Capillary: 92 mg/dL (ref 70–99)
Glucose-Capillary: 93 mg/dL (ref 70–99)
Glucose-Capillary: 95 mg/dL (ref 70–99)
Glucose-Capillary: 96 mg/dL (ref 70–99)

## 2024-03-06 LAB — BASIC METABOLIC PANEL WITH GFR
Anion gap: 9 (ref 5–15)
Anion gap: 9 (ref 5–15)
BUN: 13 mg/dL (ref 8–23)
BUN: 13 mg/dL (ref 8–23)
CO2: 17 mmol/L — ABNORMAL LOW (ref 22–32)
CO2: 17 mmol/L — ABNORMAL LOW (ref 22–32)
Calcium: 7.5 mg/dL — ABNORMAL LOW (ref 8.9–10.3)
Calcium: 7.1 mg/dL — ABNORMAL LOW (ref 8.9–10.3)
Chloride: 110 mmol/L (ref 98–111)
Chloride: 109 mmol/L (ref 98–111)
Creatinine, Ser: 1.28 mg/dL — ABNORMAL HIGH (ref 0.44–1.00)
Creatinine, Ser: 1.19 mg/dL — ABNORMAL HIGH (ref 0.44–1.00)
GFR, Estimated: 43 mL/min — ABNORMAL LOW (ref >60)
GFR, Estimated: 47 mL/min — ABNORMAL LOW (ref >60)
Glucose, Bld: 88 mg/dL (ref 70–99)
Glucose, Bld: 124 mg/dL — ABNORMAL HIGH (ref 70–99)
Potassium: 3.6 mmol/L (ref 3.5–5.1)
Potassium: 3.4 mmol/L — ABNORMAL LOW (ref 3.5–5.1)
Sodium: 136 mmol/L (ref 135–145)
Sodium: 135 mmol/L (ref 135–145)

## 2024-03-06 LAB — URINALYSIS, ROUTINE W REFLEX MICROSCOPIC
Bilirubin Urine: NEGATIVE
Glucose, UA: NEGATIVE mg/dL
Ketones, ur: NEGATIVE mg/dL
Leukocytes,Ua: NEGATIVE
Nitrite: NEGATIVE
Protein, ur: NEGATIVE mg/dL
RBC / HPF: >50 RBC/hpf (ref 0–5)
Specific Gravity, Urine: 1.011 (ref 1.005–1.030)
pH: 5 (ref 5.0–8.0)

## 2024-03-06 LAB — CBC
HCT: 32.5 % — ABNORMAL LOW (ref 36.0–46.0)
Hemoglobin: 10.9 g/dL — ABNORMAL LOW (ref 12.0–15.0)
MCH: 29.9 pg (ref 26.0–34.0)
MCHC: 33.5 g/dL (ref 30.0–36.0)
MCV: 89 fL (ref 80.0–100.0)
Platelets: 54 K/uL — ABNORMAL LOW (ref 150–400)
RBC: 3.65 MIL/uL — ABNORMAL LOW (ref 3.87–5.11)
RDW: 18.1 % — ABNORMAL HIGH (ref 11.5–15.5)
WBC: 18.2 K/uL — ABNORMAL HIGH (ref 4.0–10.5)
nRBC: 0 % (ref 0.0–0.2)

## 2024-03-06 LAB — SURGICAL PATHOLOGY

## 2024-03-06 MED ORDER — SODIUM CHLORIDE 0.9% FLUSH
10.0000 mL | Freq: Two times a day (BID) | INTRAVENOUS | Status: DC
  Administered 2024-03-06 – 2024-03-07 (×3): 10 mL
  Administered 2024-03-08 (×2): 20 mL
  Administered 2024-03-09: 40 mL
  Administered 2024-03-09 – 2024-04-01 (×42): 10 mL
  Administered 2024-04-01: 20 mL
  Administered 2024-04-02 – 2024-04-04 (×5): 10 mL

## 2024-03-06 MED ORDER — OXYCODONE HCL 5 MG PO TABS
5.0000 mg | ORAL_TABLET | ORAL | Status: DC | PRN
  Administered 2024-03-06 – 2024-03-07 (×4): 10 mg via ORAL
  Filled 2024-03-06 (×5): qty 2

## 2024-03-06 MED ORDER — DEXTROSE 10 % IV SOLN: INTRAVENOUS | Status: DC

## 2024-03-06 MED ORDER — HYDROMORPHONE HCL 1 MG/ML IJ SOLN
1.0000 mg | INTRAMUSCULAR | Status: DC | PRN
  Administered 2024-03-06 – 2024-03-08 (×10): 1 mg via INTRAVENOUS
  Filled 2024-03-06 (×10): qty 1

## 2024-03-06 MED ORDER — THIAMINE HCL 100 MG/ML IJ SOLN
100.0000 mg | INTRAMUSCULAR | Status: AC
  Administered 2024-03-07 – 2024-03-11 (×5): 100 mg via INTRAVENOUS
  Filled 2024-03-06 (×5): qty 2

## 2024-03-06 MED ORDER — SODIUM CHLORIDE 0.9% FLUSH: 10.0000 mL | INTRAVENOUS | Status: DC | PRN

## 2024-03-06 MED ORDER — MELATONIN 3 MG PO TABS
3.0000 mg | ORAL_TABLET | Freq: Every day | ORAL | Status: DC
  Administered 2024-03-07 – 2024-04-03 (×28): 3 mg via ORAL
  Filled 2024-03-06 (×29): qty 1

## 2024-03-06 MED ORDER — DEXTROSE 10 % IV SOLN: INTRAVENOUS | Status: AC

## 2024-03-06 MED ORDER — TRAVASOL 10 % IV SOLN
INTRAVENOUS | Status: AC
  Filled 2024-03-06: qty 556.8

## 2024-03-06 MED ORDER — LABETALOL HCL 5 MG/ML IV SOLN
10.0000 mg | INTRAVENOUS | Status: DC | PRN
  Administered 2024-03-06 – 2024-03-08 (×4): 10 mg via INTRAVENOUS
  Filled 2024-03-06 (×4): qty 4

## 2024-03-06 MED ORDER — POTASSIUM CHLORIDE 10 MEQ/100ML IV SOLN
10.0000 meq | INTRAVENOUS | Status: AC
  Administered 2024-03-06 (×2): 10 meq via INTRAVENOUS
  Filled 2024-03-06 (×2): qty 100

## 2024-03-06 MED ORDER — HYDRALAZINE HCL 20 MG/ML IJ SOLN
10.0000 mg | INTRAMUSCULAR | Status: DC | PRN
  Administered 2024-03-06 – 2024-03-07 (×8): 10 mg via INTRAVENOUS
  Filled 2024-03-06 (×8): qty 1

## 2024-03-06 MED ORDER — THIAMINE HCL 100 MG/ML IJ SOLN
100.0000 mg | Freq: Once | INTRAMUSCULAR | Status: AC
  Administered 2024-03-06: 100 mg via INTRAVENOUS
  Filled 2024-03-06: qty 2

## 2024-03-06 NOTE — Progress Notes (Addendum)
 NAME:  Alexandria Durham, MRN:  [id number], DOB:  [birth year], LOS: 6 ADMISSION DATE:  02/29/2024, CONSULTATION DATE:  03/06/24 REFERRING MD:  [name], CHIEF COMPLAINT:  post-op ventilator management    History of Present Illness:   Alexandria Durham is a 78 y.o.  F with PMH significant for thickened endometrium, HTN, Type 2 DM, CKD stage IIIa, recent pancreatitis and acute cholecystitis s/p lap chole and D&C in May of this year who was admitted 7/1 for hysteroscopy and repeat D&C.  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 Pt was acidotic post-procedure, so decision made to leave intubated, PCCM consulted in this setting.   Pertinent  Medical History   has a past medical history of Arthritis, Chronic kidney disease, COPD (chronic obstructive pulmonary disease) (HCC), Diabetes mellitus, Family history of adverse reaction to anesthesia, GERD (gastroesophageal reflux disease), Heart murmur, Hypercholesteremia, Hypertension, and Hypothyroidism.   Significant Hospital Events: Including procedures, antibiotic start and stop dates in addition to other pertinent events   7/2 admit to ICU post-op, ventilated  7/3 pt remains on low dose neo, weaned off sedation but mental status still precludes SBT 7/4 extubated and weaned off pressors 7/6: chest pain yesterday with flat trops, non-ischemic EKG. CT CAP with diverticulitis of sigmoid. Had CT head which was negative.   Interim History / Subjective:  Hypoglycemic event overnight, D5 increased. This morning having some abdominal pain, given dilaudid  with wound care changes. Worsening leukocytosis. CT imaging 7/6 concern for possible abscess? Gyn onc discussing with colorectal and IR.   Objective    Blood pressure (!) 176/74, pulse (!) 107, temperature 97.7 F (36.5 C), temperature source Oral, resp. rate 19, height 5' 4  (1.626 m), weight 79.1 kg, SpO2 97%.        Intake/Output Summary (Last 24 hours) at 03/06/2024 0924 Last data filed at 03/06/2024 0900 Gross per 24 hour  Intake 1980.12 ml  Output 1350 ml  Net 630.12 ml   Filed Weights   03/01/24 1127 03/01/24 1823 03/06/24 0200  Weight: 64 kg 72.6 kg 79.1 kg   Examination: Blood pressure (!) 152/57, pulse 96, temperature (!) 97.4 F (36.3 C), temperature source Oral, resp. rate 10, height 5' 4 (1.626 m), weight 72.6 kg, SpO2 96%. Gen: elderly female, laying in bed, ill appearing HEENT:  EOMI, sclera anicteric, mm dry Lungs: room air, diminished bases otherwise clear, resp even and unlabored  CV: s1s2, no m/r/g Abd: LLQ ostomy, midline wound vac, jp drain, abdomen soft, ttp  Ext: no peripheral edema Neuro: just received 2mg  dilaudid  before exam so drowsy, wakes to voice   Resolved problem list  shock Assessment and Plan  S/p D&C procedure complicated by bowel perforation S/p partial colectomy and ileostomy  Septic shock, shock resolved  Presented for d&c on 7/1 c/b bowel perforation. OR 7/2 after development of septic shock, zosyn  started. Had ex lap with sigmoidoscopy, partial collection and ileostomy placement. Now off pressors. CT AP 7/6 showing sigmoid diverticulitis, air-fluid level 4.6cm unable to discern abscess vs segment of colon.  - ongoing leukocytosis up to 18.2 from 16 yesterday.  - con't zosyn , no vanc for now  - gyn onc to discuss with IR and colorectal for possible drain placement of questionable abscess found on CT  - PICC order placed, will need TPN - pain control with dilaudid , lidocaine  patch, nausea  w/ zofran    Chest pain  7/6 complained of chest pressure. Trop x2 flat @ 22. EKG non ischemic. CXR with no acute findings.  - tele-monitor   Leukocytosis  Worsening 6.7>16.3>18.2. no fever. CT findings IR believes is intraluminal and not drainable collection.  - will send off UA, expectorated sputum  - continue zosyn  as  above  - monitor fever, wbc curve  Hypokalemia  AKI, resolved  CKD, baseline creatinine 1.2 - replete K  - trend bmp, mag, phos - strict I&O - Avoid nephrotoxic agents, renally dose medications - ensure adequate renal perfusion   Acute Thrombocytopenia  No signs of active bleeding and hemoglobin is stable. Likely result of critical illness.  - trend and transfuse prn  - no heparin  for now   Type 2 DM  Hypoglycemia  Episodes of hypoglycemia. Was started on D5LR @ 75/hr but switched 7/7 @ 0100 to D10 @ 75/hr. May be from being NPO with hypermetabolic state at this time.  - continue D10 @ 75 hr  - PICC order and to start TPN once placed per surgery  - cbg q4h  - ssi prn   Hypothyroidism  - con't synthroid  75mcg daily - NPO so cannot receive  Hyperlipidemia  HTN - con't zetia  10mg  daily - NPO to cannot receive  - hydralazine  prn for SBP > 180  Best Practice (right click and Reselect all SmartList Selections daily)   Diet/type: NPO and TPN DVT prophylaxis SCD GI prophylaxis: PPI Lines: Central line Foley:  Yes, and it is still needed Code Status:  full code Last date of multidisciplinary goals of care discussion [per primary]  Critical care time:     The patient is critically ill with multiple organ system failure and requires high complexity decision making for assessment and support, frequent evaluation and titration of therapies, advanced monitoring, review of radiographic studies and interpretation of complex data.   Critical Care Time devoted to patient care services, exclusive of separately billable procedures, described in this note is 35 minutes.   Tinnie FORBES Furth, PA-C [facility] 03/06/24 9:29 AM  Please see [email or web address] for pager details.  From 7A-7P if no response, please call [phone] After hours, please call ELink [phone]

## 2024-03-06 NOTE — Plan of Care (Signed)
  Problem: Clinical Measurements: Goal: Ability to maintain clinical measurements within normal limits will improve Outcome: Progressing Goal: Will remain free from infection Outcome: Progressing Goal: Diagnostic test results will improve Outcome: Progressing Goal: Respiratory complications will improve Outcome: Progressing Goal: Cardiovascular complication will be avoided Outcome: Progressing   Problem: Safety: Goal: Ability to remain free from injury will improve Outcome: Progressing   Problem: Nutrition: Goal: Adequate nutrition will be maintained Outcome: Not Progressing   Problem: Coping: Goal: Level of anxiety will decrease Outcome: Not Progressing

## 2024-03-06 NOTE — Progress Notes (Signed)
 PHARMACY - TOTAL PARENTERAL NUTRITION CONSULT NOTE   Indication: Prolonged ileus  Patient Measurements: Height: 5' 4 (162.6 cm) Weight: 79.1 kg (174 lb 6.1 oz) IBW/kg (Calculated) : 54.7 TPN AdjBW (KG): 60.8 Body mass index is 29.93 kg/m.  Assessment: 78 year old female s/p hysterectomy with D&C on 7/1. Concern for postoperative bowel perforation. Pt back to OR on 7/2 for ex lap with colectomy and ostomy creation. Pharmacy consulted for TPN.  Glucose / Insulin : pt on Januvia per PTA med list -CBGs currently 90-100s on D10 given prolonged NPO Electrolytes: K 3.4, corrCa slightly low Renal: Scr slightly elevated, trending down Hepatic: LFTs elevated on 7/5; alb 2.2 Intake / Output; MIVF:  -D10 at 75 ml/hr -UOP 650 ml so far today -Drain ~ 100 ml last 24 hr -Stool 30 ml x 2 GI Imaging: -7/6 CT A/P 4.6 cm diameter air-fluid level in the anterior pelvis in or adjacent to the sigmoid colon; this may represent an abscess or an air-fluid level within a dilated segment of colon GI Surgeries / Procedures:  -7/2 ex lap, colectomy w/ colostomy -7/1 hysterectomy  Central access: pending PICC TPN start date: 7/7 pending access  Nutritional Goals: Goal TPN rate is 75 mL/hr (provides 104 g of protein and 1778 kcals per day)  RD Assessment: Estimated Needs Total Energy Estimated Needs: 1750-1900 kcals Total Protein Estimated Needs: 90-110 grams Total Fluid Estimated Needs: >/= 1.8L  Current Nutrition: NPO  Plan:  -Potassium 10 mEq x 2 given this am -Start TPN at 40 mL/hr at 1800 -Electrolytes in TPN: Na 50 mEq/L, K 50 mEq/L, Ca 5 mEq/L, Mg 5 mEq/L, and Phos 15 mmol/L. Cl:Ac 1:2 -Add standard MVI and trace elements to TPN -Continue moderate SSI q4h for now - currently not using given some mild hypoglycemia now on D10 -Reduce MIVF to 35 mL/hr at 1800 -Thiamine  x 6 days for refeeding risk -Monitor TPN labs on Mon/Thurs, daily while monitoring for refeeding  Stefano MARLA Bologna,  PharmD, BCPS Clinical Pharmacist 03/06/2024 12:31 PM

## 2024-03-06 NOTE — Progress Notes (Signed)
 PT Cancellation Note  Patient Details Name: Alexandria Durham MRN: [id number] DOB: August 20, 1946   Cancelled Treatment:    Reason Eval/Treat Not Completed: Pain limiting ability to participate. PT arrived 1021 and pt in pain due to recent wound vac change. PT to return later in the day as schedule allows. PT to continue to follow acutely.   [place], PT Acute Rehab   [place] VEAR Drone 03/06/2024, 10:31 AM

## 2024-03-06 NOTE — Progress Notes (Signed)
 Nutrition Follow-up  DOCUMENTATION CODES:   Severe malnutrition in context of acute illness/injury  INTERVENTION:  - TPN to start tonight at 52mL/hr.   - TPN management per pharmacy.   - Patient also to start D10 @ 11mL/hr. Would recommend discontinuing after starting TPN.  - Monitor magnesium , potassium, and phosphorus BID for at least 3 days, MD to replete as needed, as pt is at risk for refeeding syndrome given malnutrition with significant weight loss and minimal nutrition x2 months. - 100mg  thiamine  x6 days.    - Daily weights while on TPN.  - Will monitor for diet advancement.   NUTRITION DIAGNOSIS:   Severe Malnutrition related to acute illness as evidenced by energy intake < or equal to 50% for > or equal to 5 days, mild muscle depletion, percent weight loss (10% in 1.5 months). *ongoing  GOAL:   Patient will meet greater than or equal to 90% of their needs *not met  MONITOR:   Vent status, Labs, Weight trends  REASON FOR ASSESSMENT:   Consult New TPN/TNA  ASSESSMENT:   78 y.o.  F with PMH significant for thickened endometrium, HTN, Type 2 DM, CKD stage IIIa, recent pancreatitis and acute cholecystitis s/p lap chole and D&C in May of this year who was admitted 7/1 for hysteroscopy and repeat D&C.  7/1 hysteroscopy and repeat D&C. 7/2 found to have perforation of the uterus and mesenteric injury to the Sigmoid colon; s/p  ex-lap with partial colectomy and colostomy, OGT placed 7/4 Extubated  Patient somewhat confused during visit, daughter and RN at bedside.  Discussed plan to start TPN with daughter. Addressed all questions and concerns.  Discussed patient with pharmacy. Suspect patient at high risk for refeeding syndrome given significant weight loss and minimal nutritional intake for >2 months.  TPN to start tonight at 59mL/hr, thiamine  ordered for 6 days.  Of note, patient currently on D10 @ 88mL/hr. Per pharmacy note, plan to decrease to 82mL/hr once TPN  starts, which will provide 285 kcals over 24 hours.   Admit weight: 142# Current weight: 174# I&O's: +15.5L since admit + for right/left lowe extremity edema  Medications reviewed and include: D10% 80mL/hr (provides 612 kcals over 24 hours)   Labs reviewed:  K+ 3.4 Creatinine 1.19 HA1C 6.8 Blood Glucose 65-132 x24 hours   Diet Order:   Diet Order             Diet NPO time specified  Diet effective now                   EDUCATION NEEDS:  No education needs have been identified at this time  Skin:  Skin Assessment: Skin Integrity Issues: Skin Integrity Issues:: Incisions Incisions: Abdomen  Last BM:  7/7 - colostomy  Height:  Ht Readings from Last 1 Encounters:  03/06/24 5' 4 (1.626 m)   Weight:  Wt Readings from Last 1 Encounters:  03/06/24 79.1 kg   Ideal Body Weight:  54.55 kg  BMI:  Body mass index is 29.93 kg/m.  Estimated Nutritional Needs:  Kcal:  1750-1900 kcals Protein:  90-110 grams Fluid:  >/= 1.8L    Trude Ned RD, LDN Contact via Secure Chat.

## 2024-03-06 NOTE — Progress Notes (Signed)
 eLink Physician-Brief Progress Note Patient Name: Alexandria Durham DOB: May 06, 1946 MRN: [id number]   Date of Service  03/06/2024  HPI/Events of Note  Low glucose despite being on D5 LR with Kcl infusing at 75 cc/hr. NPO  eICU Interventions  Stop current fluids Switch to D10 at 75 cc/hour Check hourly glucose for 6 hours Check BMP now and plz let me know results      Intervention Category Major Interventions: Electrolyte abnormality - evaluation and management  Ciana Simmon G Sigrid Schwebach 03/06/2024, 12:59 AM

## 2024-03-06 NOTE — Plan of Care (Signed)
  Problem: Clinical Measurements: Goal: Respiratory complications will improve Outcome: Progressing   Problem: Elimination: Goal: Will not experience complications related to bowel motility Outcome: Progressing   Problem: Safety: Goal: Ability to remain free from injury will improve Outcome: Progressing   Problem: Skin Integrity: Goal: Risk for impaired skin integrity will decrease Outcome: Progressing   Problem: Activity: Goal: Risk for activity intolerance will decrease Outcome: Not Progressing

## 2024-03-06 NOTE — Consult Note (Addendum)
 WOC Nurse Wound Consult Note: Reason for Consult: Consult performed for Vac change.  Full thickness post-op wound to midline abd, dusky red. 13X4X5cm. Mod amt tan drainage in the cannister. Pt was medicated for pain prior to the procedure and tolerated with mod amt discomfort.  Applied one piece black foam to cont suction. Applied barrier ring to lower edges to attempt to maintain a seal.  WOC team will plan to change dressing again on Thursday.   WOC Nurse ostomy follow up Current flat pouch is leaking behind the barrier.  Applied barrier ring and one piece convex pouch to attempt to maintain a seal, Pt is in ICU and was groggy and did not watch the pouch change or ask questions.  No family at the bedside.  Stoma is 50% dusky and 50% slough, 1 1/2 inches, flush with skin level.  Notified surgeon and PA of stoma appearance via Secure Chat.  30cc brown foul-smelling liquid in the pouch when it was changed. 1 1/2 inches. Applied barrier ring and one piece convex pouch.  Educational materials left at the bedside. 5 sets of supplies ordered to the bedside for staff nurses' use: Use Supplies: barrier rings, Gerlean # [phone], convex pouches Gerlean # [phone] Enrolled patient in [place] Secure Start Discharge program: NOT YET WOC team will perform pouch change again Thurs and begin teaching when Pt is stable and out of ICU.  Thank-you,  Stephane Fought MSN, RN, CWOCN, [place], CNS [phone]

## 2024-03-06 NOTE — Progress Notes (Signed)
 Peripherally Inserted Central Catheter Placement  The IV Nurse has discussed with the patient and/or persons authorized to consent for the patient, the purpose of this procedure and the potential benefits and risks involved with this procedure.  The benefits include less needle sticks, lab draws from the catheter, and the patient may be discharged home with the catheter. Risks include, but not limited to, infection, bleeding, blood clot (thrombus formation), and puncture of an artery; nerve damage and irregular heartbeat and possibility to perform a PICC exchange if needed/ordered by physician.  Alternatives to this procedure were also discussed.  Bard Power PICC patient education guide, fact sheet on infection prevention and patient information card has been provided to patient /or left at bedside. Consent obtained by RN and signed by daughter.  PICC Placement Documentation  PICC Triple Lumen 03/06/24 Right Brachial 38 cm 0 cm (Active)  Indication for Insertion or Continuance of Line Administration of hyperosmolar/irritating solutions (i.e. TPN, Vancomycin, etc.) 03/06/24 1858  Exposed Catheter (cm) 0 cm 03/06/24 1800  Site Assessment Clean, Dry, Intact 03/06/24 1858  Lumen #1 Status Flushed;Saline locked;Blood return noted 03/06/24 1858  Lumen #2 Status Flushed;Saline locked;Blood return noted 03/06/24 1858  Lumen #3 Status Flushed;Saline locked;Blood return noted 03/06/24 1858  Dressing Type Transparent;Securing device 03/06/24 1858  Dressing Status Antimicrobial disc/dressing in place;Clean, Dry, Intact 03/06/24 1858  Line Care Connections checked and tightened 03/06/24 1858  Line Adjustment (NICU/IV Team Only) No 03/06/24 1800  Dressing Intervention New dressing 03/06/24 1858  Dressing Change Due 03/13/24 03/06/24 1858       Leita  Paeton Studer 03/06/2024, 7:00 PM

## 2024-03-06 NOTE — Progress Notes (Signed)
 GYN Oncology Progress Note  5 Days Post-Op Procedure(s) (LRB): LAPAROTOMY, EXPLORATORY (N/A) SIGMOIDOSCOPY COLECTOMY, WITH COLOSTOMY CREATION MOBILIZATION, SPLENIC FLEXURE, WITH PARTIAL COLECTOMY CYSTOSCOPY  Above procedure performed after recent D&C procedure on 02/29/2024 for thickened endometrium. D&C procedure complicated by bowel perforation. Patient taken back to the OR on 03/01/24 after developing signs of sepsis,shock (hypotension, tachycardia, peritonitis symptoms). She was started on zosyn  on 03/01/2024 and this has been continued. She was admitted to ICU post-op on ventilation. Now extubated.  Subjective: Sleepy but arousable to name and touch, giving one word answers, groaning intermittently. States she feels terrible related to moderate/severe abdominal pain. Denies nausea. RN reporting hallucinations yesterday and overnight. Feels slight improvement in orientation this am. Patient able to state name and location as [facility].   Objective: Vital signs in last 24 hours: Temp:  [97.7 F (36.5 C)-98.4 F (36.9 C)] 97.7 F (36.5 C) (07/07 0800) Pulse Rate:  [94-112] 107 (07/07 0800) Resp:  [11-19] 19 (07/07 0800) BP: (108-189)/(54-94) 176/74 (07/07 0800) SpO2:  [95 %-98 %] 97 % (07/07 0800) Weight:  [174 lb 6.1 oz (79.1 kg)] 174 lb 6.1 oz (79.1 kg) (07/07 0200) Last BM Date : 03/06/24  Intake/Output from previous day: 07/06 0701 - 07/07 0700 In: 1535.5 [I.V.:1218.7; IV Piggyback:316.8] Out: 1220 [Urine:1100; Drains:90; Stool:30]  Physical Examination: General: Sleepy, and arousable to name and touch, groaning intermittently Resp: Normal work of breathing. Lungs with some mild scattered rhonchi Cardio: RRR, mildly tachy at 103 bpm GI: incision: midline abdominal incision with wound VAC dressing in place connected to suction and abdomen soft, non-distended, moderately TTP, hypoactive bowel sounds, JP drain with serosanguinous drainage- drain stripped, ostomy with small amount  of dark serous output, darkening around the superior edges of stoma with central mucosa pink. No gas in bag. WO RN to come change bag and VAC shortly. Extremities: extremities normal, atraumatic, SCDs in place, edema in bilateral hands Foley in place with clear yellow urine O2 sat 97 on RA  Labs: WBC/Hgb/Hct/Plts:  18.2/10.9/32.5/54 (07/07 0447) BUN/Cr/glu/ALT/AST/amyl/lip:  13/1.19/--/--/--/--/-- (07/07 0447)  Assessment: 78 y.o. s/p Procedure(s): LAPAROTOMY, EXPLORATORY, SIGMOIDOSCOPY, COLECTOMY, WITH COLOSTOMY CREATION, MOBILIZATION, SPLENIC FLEXURE, WITH PARTIAL COLECTOMY, CYSTOSCOPY: stable  CXR and EKG performed yesterday AM due to report of difficulty breathing, chest pressure yesterday; currently on room air. Given increasing WBC count with concern for infection with delirium, CT CAP along with CT head performed yesterday: CT CAP: 1. Colonic diverticulosis with evidence of acute diverticulitis in the sigmoid region. 2. Postoperative diverting left lower quadrant descending colostomy. Surgical drain in the pelvis. 3. Small amount of free fluid in the pelvis is likely reactive or postoperative. 4. 4.6 cm diameter air-fluid level in the anterior pelvis in or adjacent to the sigmoid colon. This may represent an abscess or an air-fluid level within a dilated segment of colon. Repeat imaging with rectal contrast material may help to distinguish. 5. Bilateral pleural effusions with basilar atelectasis or infiltration. This may represent compressive atelectasis or pneumonia. 6. Aortic atherosclerosis. 7. Anterior abdominal wall surgical wound. Diffuse subcutaneous edema.  CT Head: 1. Atrophy and chronic small vessel ischemic changes of the white matter. 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 2. Negative for hemorrhage, mass or large vessel territorial infarct.  Pain:  PRN medications ordered. Plan to adjust pain medication today to include oral as  needed.  Heme: Hgb 10.9 this am. Continue to monitor.  Status post 2 units in the operating room.  ID: WBC increased  to 18.2 this AM. Afebrile. Currently on zosyn  for bowel perforation from recent D&C. S/P exploratory laparotomy with colectomy and ostomy creation. Given increasing WBC count, plan to discuss need for broadening antibiotics with CCM per Dr. [name]. Also plan on discussing air filled collection on CT with IR.    CV: On monitoring. BP and HR overall stable at this time. Mildly tachy this am at 103 bpm.  PLT 54 this am, stable.  GI:  Diet: NPO. On mIVF. Has small amount of bowel sweat in ostomy, no gas. Stoma is edematous but with pink mucosa centrally. WO RN to come change VAC and stoma. Plan for initiation of PICC line and TPN given slow to return bowel function, high risk for ileus, prolonged NPO status.   GU: Creatinine 1.19.  Improving.  On mIVF while NPO. Can remove foley catheter and apply purewick.  FEN: Am metabolic panel reviewed. Repletion as needed.    Endo: Diabetes. CBG (last 3)  Recent Labs    03/06/24 0436 03/06/24 0601 03/06/24 0718  GLUCAP 122* 90 95     Prophylaxis: SCDs, heparin  held at this time due to thrombocytopenia.  Plan: Continue with current plan of care per Dr. [name] and Critical Care. Appreciate management. Elevated WBC but afebrile. Dr. [name] to discuss CT findings with IR, stoma with Colorectal.  Improvement in creatinine, stable UOP. Remove foley and continue to monitor I&Os with purewick. Adjust in pain meds to include oral. Continue NPO except sips with meds until gas in ostomy.  Appreciate WO evaluation, management. Continue daily labs.   LOS: 6 days    Alexandria Durham 03/06/2024, 9:23 AM

## 2024-03-07 ENCOUNTER — Telehealth: Payer: Self-pay | Admitting: Oncology

## 2024-03-07 DIAGNOSIS — N950 Postmenopausal bleeding: Secondary | ICD-10-CM | POA: Diagnosis not present

## 2024-03-07 LAB — CBC
HCT: 32.3 % — ABNORMAL LOW (ref 36.0–46.0)
Hemoglobin: 11.5 g/dL — ABNORMAL LOW (ref 12.0–15.0)
MCH: 30.3 pg (ref 26.0–34.0)
MCHC: 35.6 g/dL (ref 30.0–36.0)
MCV: 85.2 fL (ref 80.0–100.0)
Platelets: 62 K/uL — ABNORMAL LOW (ref 150–400)
RBC: 3.79 MIL/uL — ABNORMAL LOW (ref 3.87–5.11)
RDW: 17.3 % — ABNORMAL HIGH (ref 11.5–15.5)
WBC: 11.8 K/uL — ABNORMAL HIGH (ref 4.0–10.5)
nRBC: 0 % (ref 0.0–0.2)

## 2024-03-07 LAB — URINE CULTURE: Culture: NO GROWTH

## 2024-03-07 LAB — COMPREHENSIVE METABOLIC PANEL WITH GFR
ALT: 76 U/L — ABNORMAL HIGH (ref 0–44)
AST: 52 U/L — ABNORMAL HIGH (ref 15–41)
Albumin: 2.1 g/dL — ABNORMAL LOW (ref 3.5–5.0)
Alkaline Phosphatase: 123 U/L (ref 38–126)
Anion gap: 8 (ref 5–15)
BUN: 16 mg/dL (ref 8–23)
CO2: 22 mmol/L (ref 22–32)
Calcium: 7.8 mg/dL — ABNORMAL LOW (ref 8.9–10.3)
Chloride: 105 mmol/L (ref 98–111)
Creatinine, Ser: 0.83 mg/dL (ref 0.44–1.00)
GFR, Estimated: >60 mL/min (ref >60)
Glucose, Bld: 153 mg/dL — ABNORMAL HIGH (ref 70–99)
Potassium: 3.4 mmol/L — ABNORMAL LOW (ref 3.5–5.1)
Sodium: 135 mmol/L (ref 135–145)
Total Bilirubin: 1.6 mg/dL — ABNORMAL HIGH (ref 0.0–1.2)
Total Protein: 4.8 g/dL — ABNORMAL LOW (ref 6.5–8.1)

## 2024-03-07 LAB — GLUCOSE, CAPILLARY
Glucose-Capillary: 136 mg/dL — ABNORMAL HIGH (ref 70–99)
Glucose-Capillary: 139 mg/dL — ABNORMAL HIGH (ref 70–99)
Glucose-Capillary: 164 mg/dL — ABNORMAL HIGH (ref 70–99)
Glucose-Capillary: 132 mg/dL — ABNORMAL HIGH (ref 70–99)
Glucose-Capillary: 143 mg/dL — ABNORMAL HIGH (ref 70–99)
Glucose-Capillary: 126 mg/dL — ABNORMAL HIGH (ref 70–99)
Glucose-Capillary: 122 mg/dL — ABNORMAL HIGH (ref 70–99)

## 2024-03-07 LAB — PHOSPHORUS: Phosphorus: 1.2 mg/dL — ABNORMAL LOW (ref 2.5–4.6)

## 2024-03-07 LAB — MAGNESIUM: Magnesium: 1.6 mg/dL — ABNORMAL LOW (ref 1.7–2.4)

## 2024-03-07 MED ORDER — TRAVASOL 10 % IV SOLN
INTRAVENOUS | Status: AC
  Filled 2024-03-07: qty 556.8

## 2024-03-07 MED ORDER — MAGNESIUM SULFATE 2 GM/50ML IV SOLN
2.0000 g | Freq: Once | INTRAVENOUS | Status: AC
  Administered 2024-03-07: 2 g via INTRAVENOUS
  Filled 2024-03-07: qty 50

## 2024-03-07 MED ORDER — HEPARIN SODIUM (PORCINE) 5000 UNIT/ML IJ SOLN
5000.0000 [IU] | Freq: Three times a day (TID) | INTRAMUSCULAR | Status: DC
  Administered 2024-03-07 – 2024-03-13 (×18): 5000 [IU] via SUBCUTANEOUS
  Filled 2024-03-07 (×18): qty 1

## 2024-03-07 MED ORDER — GABAPENTIN 300 MG PO CAPS
300.0000 mg | ORAL_CAPSULE | Freq: Three times a day (TID) | ORAL | Status: DC
  Administered 2024-03-07 – 2024-03-08 (×4): 300 mg via ORAL
  Filled 2024-03-07 (×4): qty 1

## 2024-03-07 MED ORDER — POLYETHYLENE GLYCOL 3350 17 G PO PACK
17.0000 g | PACK | Freq: Every day | ORAL | Status: DC
  Administered 2024-03-07 – 2024-04-04 (×26): 17 g via ORAL
  Filled 2024-03-07 (×26): qty 1

## 2024-03-07 MED ORDER — OXYCODONE HCL 5 MG PO TABS
10.0000 mg | ORAL_TABLET | ORAL | Status: DC | PRN
  Administered 2024-03-07 (×2): 10 mg via ORAL
  Administered 2024-03-08: 15 mg via ORAL
  Administered 2024-03-08: 10 mg via ORAL
  Administered 2024-03-08 (×2): 15 mg via ORAL
  Administered 2024-03-08: 10 mg via ORAL
  Administered 2024-03-09 – 2024-03-13 (×16): 15 mg via ORAL
  Administered 2024-03-14 – 2024-03-16 (×3): 10 mg via ORAL
  Administered 2024-03-16 – 2024-03-20 (×9): 15 mg via ORAL
  Administered 2024-03-20 – 2024-03-22 (×3): 10 mg via ORAL
  Administered 2024-03-23 (×2): 15 mg via ORAL
  Administered 2024-03-24: 10 mg via ORAL
  Administered 2024-03-27: 15 mg via ORAL
  Administered 2024-03-27: 10 mg via ORAL
  Administered 2024-03-28: 15 mg via ORAL
  Administered 2024-03-29 – 2024-03-30 (×6): 10 mg via ORAL
  Administered 2024-03-31 – 2024-04-01 (×2): 15 mg via ORAL
  Administered 2024-04-01: 10 mg via ORAL
  Administered 2024-04-01: 15 mg via ORAL
  Administered 2024-04-01: 10 mg via ORAL
  Administered 2024-04-02 – 2024-04-04 (×5): 15 mg via ORAL
  Filled 2024-03-07 (×4): qty 3
  Filled 2024-03-07 (×2): qty 2
  Filled 2024-03-07 (×2): qty 3
  Filled 2024-03-07: qty 2
  Filled 2024-03-07 (×4): qty 3
  Filled 2024-03-07: qty 2
  Filled 2024-03-07: qty 3
  Filled 2024-03-07 (×2): qty 2
  Filled 2024-03-07 (×2): qty 3
  Filled 2024-03-07: qty 2
  Filled 2024-03-07 (×3): qty 3
  Filled 2024-03-07: qty 2
  Filled 2024-03-07: qty 3
  Filled 2024-03-07: qty 2
  Filled 2024-03-07 (×4): qty 3
  Filled 2024-03-07 (×2): qty 2
  Filled 2024-03-07 (×6): qty 3
  Filled 2024-03-07: qty 2
  Filled 2024-03-07 (×11): qty 3
  Filled 2024-03-07 (×3): qty 2
  Filled 2024-03-07: qty 3
  Filled 2024-03-07 (×2): qty 2
  Filled 2024-03-07: qty 3
  Filled 2024-03-07 (×2): qty 2
  Filled 2024-03-07: qty 3
  Filled 2024-03-07: qty 2
  Filled 2024-03-07 (×3): qty 3

## 2024-03-07 MED ORDER — POTASSIUM PHOSPHATES 15 MMOLE/5ML IV SOLN
30.0000 mmol | Freq: Once | INTRAVENOUS | Status: AC
  Administered 2024-03-07: 30 mmol via INTRAVENOUS
  Filled 2024-03-07: qty 10

## 2024-03-07 NOTE — Telephone Encounter (Signed)
 Called Alexandria Sawyers, DNP's office at [facility] and verified with her nurse, Jacquline, that Alexandria Durham is taking gabapentin  300 mg three times a day.  She is not taking Lyrica - it has been discontinued.

## 2024-03-07 NOTE — Evaluation (Signed)
 Physical Therapy Evaluation Patient Details Name: Alexandria Durham MRN: [id number] DOB: 10/07/1945 Today's Date: 03/07/2024  History of Present Illness  Patient is a 78 year old female who presented on 03/01/2024 following a D and C on 7/1 for thickened endometrium and procedure complicated due to bowel perforation. Pt developed signs and symptoms of sepsis and shock. 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  Pt required post op ventilation and extubated on 7/4.  Pt PMH includes but is not limited to:  recent hospital admissions for diverticulitis, pancreatitis, abdominal pain, D&C  01/06/24 with uterine perforation, cholecystectomy and d&C on 01/17/24, DM II, HTN, COPD, GERD and CKD III.  Clinical Impression  Pt admitted with above diagnosis. At baseline, pt is independent and living alone.  She did stay with family some after recent hospital admissions in May and June. Today, pt lethargic but agreeable to PT and wanting to sit EOB.  She required mod A x 2 for transfers to EOB then able to maintain with CGA/min A for 5-8 mins.  All VSS on RA during session.  Did not progress past EOB due to lethargy, pain, and with pt's hx/in bed since 7/1 good progress to sit EOB today. Pt with good rehab potential. Pt currently with functional limitations due to the deficits listed below (see PT Problem List). Pt will benefit from acute skilled PT to increase their independence and safety with mobility to allow discharge. Expect that pt will be able to improve and tolerate increased therapy.   Patient will benefit from intensive inpatient follow-up therapy, >3 hours/day at d/c.          If plan is discharge home, recommend the following: Two people to help with walking and/or transfers;Two people to help with bathing/dressing/bathroom   Can travel by Doctor, hospital cushion (measurements PT);Wheelchair (measurements PT);Hoyer lift;Hospital bed;BSC/3in1  (will need updating wtih progress)  Recommendations for Other Services  Rehab consult    Functional Status Assessment Patient has had a recent decline in their functional status and demonstrates the ability to make significant improvements in function in a reasonable and predictable amount of time.     Precautions / Restrictions Precautions Precautions: Fall Precaution/Restrictions Comments: Abdominal sx - wound vac, JP drain, ostomy      Mobility  Bed Mobility Overal bed mobility: Needs Assistance Bed Mobility: Rolling, Sidelying to Sit, Sit to Sidelying Rolling: Max assist Sidelying to sit: Mod assist, +2 for physical assistance     Sit to sidelying: Max assist, +2 for physical assistance General bed mobility comments: HOB elevated and use of bed pad to assist    Transfers                   General transfer comment: unable to attempt today - too weak, painful, lethargic; Goal was just for EOB -given pt's recent hx, sx, condition, In bed since 7/1    Ambulation/Gait                  Stairs            Wheelchair Mobility     Tilt Bed    Modified Rankin (Stroke Patients Only)       Balance Overall balance assessment: Needs assistance Sitting-balance support: Bilateral upper extremity supported Sitting balance-Leahy Scale: Poor Sitting balance - Comments: Bil UE support and CGA to min A; sat EOB 5-8 mins  Pertinent Vitals/Pain Pain Assessment Pain Assessment: Faces Faces Pain Scale: Hurts even more Pain Location: Abdomen with transfers Pain Descriptors / Indicators: Guarding, Grimacing Pain Intervention(s): Limited activity within patient's tolerance, Monitored during session, Premedicated before session, Repositioned    Home Living Family/patient expects to be discharged to:: Private residence Living Arrangements: Alone Available Help at Discharge: Family;Available  PRN/intermittently Type of Home: House Home Access: Stairs to enter Entrance Stairs-Rails: None Entrance Stairs-Number of Steps: 4   Home Layout: One level Home Equipment: Cane - single Librarian, academic (2 wheels)      Prior Function Prior Level of Function : Independent/Modified Independent             Mobility Comments: Ambulating with cane, could ambulate in community (liked going to sports games with family); Pt had recent hospital admission and stayed with dtr at d/c ADLs Comments: Independent adls and iadls     Extremity/Trunk Assessment   Upper Extremity Assessment Upper Extremity Assessment: RUE deficits/detail;LUE deficits/detail RUE Deficits / Details: Prior note reports chronic R shoulder strength issues but today pt reports L shoulder injury and demonstrated decreased strength on L compared to R.  ROM: AAROM WFL; MMT: hand/wrist 3/5, elbow 2/5, shoulder 2/5, edema bil LUE Deficits / Details: Prior note reports chronic R shoulder strength issues but today pt reports L shoulder injury and demonstrated decreased strength on L compared to R. ROM: P/AAROM WFL; MMT: hand/wrist 3/5, elbow 1/5, shoulder 1/5; edema bil    Lower Extremity Assessment Lower Extremity Assessment: LLE deficits/detail;RLE deficits/detail RLE Deficits / Details: ROM WFL; MMT 2/5 throughout LLE Deficits / Details: ROM WFL; MMT 2/5 throughout    Cervical / Trunk Assessment Cervical / Trunk Assessment: Other exceptions Cervical / Trunk Exceptions: weak trunk; abdominal sx  Communication   Communication Communication: Impaired Factors Affecting Communication: Reduced clarity of speech    Cognition Arousal: Lethargic Behavior During Therapy: Flat affect   PT - Cognitive impairments: Difficult to assess, Initiation, Sequencing, Problem solving Difficult to assess due to: Impaired communication, Level of arousal                     PT - Cognition Comments: Pt lethargic but  arousable.  Able to state name and birthday.  She also expressed hx of rotator cuff injury L shoulder.  She was slow to respond at times and speech slurred. Following commands: Intact       Cueing Cueing Techniques: Verbal cues, Gestural cues, Tactile cues     General Comments General comments (skin integrity, edema, etc.): Pt on RA.  All VSS during treatment and with transfers. BP stayed consistent in sitting    Exercises General Exercises - Lower Extremity Long Arc Quad: AROM, Both, 5 reps, Seated   Assessment/Plan    PT Assessment Patient needs continued PT services  PT Problem List Decreased strength;Decreased activity tolerance;Decreased mobility;Decreased knowledge of use of DME;Cardiopulmonary status limiting activity;Decreased range of motion;Decreased cognition;Decreased balance       PT Treatment Interventions DME instruction;Gait training;Balance training;Functional mobility training;Patient/family education;Therapeutic activities;Therapeutic exercise;Neuromuscular re-education    PT Goals (Current goals can be found in the Care Plan section)  Acute Rehab PT Goals Patient Stated Goal: not stated PT Goal Formulation: With patient/family Time For Goal Achievement: 03/21/24 Potential to Achieve Goals: Good    Frequency Min 2X/week     Co-evaluation               AM-PAC PT 6 Clicks Mobility  Outcome Measure Help needed turning  from your back to your side while in a flat bed without using bedrails?: Total Help needed moving from lying on your back to sitting on the side of a flat bed without using bedrails?: Total Help needed moving to and from a bed to a chair (including a wheelchair)?: Total Help needed standing up from a chair using your arms (e.g., wheelchair or bedside chair)?: Total Help needed to walk in hospital room?: Total Help needed climbing 3-5 steps with a railing? : Total 6 Click Score: 6    End of Session   Activity Tolerance: Other  (comment);Patient tolerated treatment well (Tolerated EOB well, unable to progress further due to lethargy/pain/condition/history) Patient left: in bed;with call bell/phone within reach;with SCD's reapplied;with family/visitor present (pillow on R to offload) Nurse Communication: Mobility status PT Visit Diagnosis: Difficulty in walking, not elsewhere classified (R26.2);Muscle weakness (generalized) (M62.81)    Time: 1024-1050 PT Time Calculation (min) (ACUTE ONLY): 26 min   Charges:   PT Evaluation $PT Eval Moderate Complexity: 1 Mod PT Treatments $Therapeutic Activity: 8-22 mins PT General Charges $$ ACUTE PT VISIT: 1 Visit         Alexandria, PT Acute Rehab [facility] Re[facility] [phone]   Alexandria Durham 03/07/2024, 11:21 AM

## 2024-03-07 NOTE — Progress Notes (Signed)
 GYN Oncology Progress Note  6 Days Post-Op Procedure(s) (LRB): LAPAROTOMY, EXPLORATORY (N/A) SIGMOIDOSCOPY COLECTOMY, WITH COLOSTOMY CREATION MOBILIZATION, SPLENIC FLEXURE, WITH PARTIAL COLECTOMY CYSTOSCOPY  Above procedure performed after recent D&C procedure on 02/29/2024 for thickened endometrium. D&C procedure complicated by bowel perforation. Patient taken back to the OR on 03/01/24 after developing signs of sepsis,shock (hypotension, tachycardia, peritonitis symptoms). She was started on zosyn  on 03/01/2024 and this has been continued. She was admitted to ICU post-op on ventilation. Now extubated. TPN started 03/06/24.  Subjective: Patient reports having abdominal pain this am and just received IV medication. No nausea reported. Daughter at the bedside stating she was called earlier this am due to patient's confusion/agitation.   Objective: Vital signs in last 24 hours: Temp:  [97.6 F (36.4 C)-98.2 F (36.8 C)] 97.6 F (36.4 C) (07/08 0340) Pulse Rate:  [93-109] 97 (07/08 0724) Resp:  [15-28] 22 (07/08 0724) BP: (151-220)/(67-103) 186/76 (07/08 0804) SpO2:  [96 %-100 %] 99 % (07/08 0724) Last BM Date : 03/06/24  Intake/Output from previous day: 07/07 0701 - 07/08 0700 In: 2267.5 [I.V.:1912.5; IV Piggyback:350.1] Out: 2400 [Urine:2350; Drains:20; Stool:30]  Physical Examination: General: Sleepy, and arousable to name and touch Resp: Normal work of breathing. Lungs with some mild scattered rhonchi Cardio: RRR GI: incision: midline abdominal incision with wound VAC dressing in place connected to suction and abdomen soft, non-distended, mild appropriate TTP, hypoactive bowel sounds, JP drain with serosanguinous drainage, ostomy with small amount of serous output with no flatus, darkening around the superior edges of stoma remains with central mucosa pink.  Extremities: extremities normal, atraumatic, SCDs in place, edema in bilateral hands Purewick in place with clear yellow  urine  Labs:   BUN/Cr/glu/ALT/AST/amyl/lip:  16/0.83/--/76/52/--/-- (07/08 0359)  Assessment: 78 y.o. s/p Procedure(s): LAPAROTOMY, EXPLORATORY, SIGMOIDOSCOPY, COLECTOMY, WITH COLOSTOMY CREATION, MOBILIZATION, SPLENIC FLEXURE, WITH PARTIAL COLECTOMY, CYSTOSCOPY: stable  CXR and EKG performed 03/05/24 AM due to report of difficulty breathing, chest pressure yesterday; currently on room air. Given increasing WBC count with concern for infection with delirium, CT CAP along with CT head performed 03/05/2024 as well: CT CAP: 1. Colonic diverticulosis with evidence of acute diverticulitis in the sigmoid region. 2. Postoperative diverting left lower quadrant descending colostomy. Surgical drain in the pelvis. 3. Small amount of free fluid in the pelvis is likely reactive or postoperative. 4. 4.6 cm diameter air-fluid level in the anterior pelvis in or adjacent to the sigmoid colon. This may represent an abscess or an air-fluid level within a dilated segment of colon. Repeat imaging with rectal contrast material may help to distinguish. 5. Bilateral pleural effusions with basilar atelectasis or infiltration. This may represent compressive atelectasis or pneumonia. 6. Aortic atherosclerosis. 7. Anterior abdominal wall surgical wound. Diffuse subcutaneous edema.  CT Head: 1. Atrophy and chronic small vessel ischemic changes of the white matter. 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 2. Negative for hemorrhage, mass or large vessel territorial infarct.  Pain:  PRN medications ordered. Has oral for use initially, then IV for pain not relieved with oral.  Heme: Awaiting results of CBC. Continue to monitor.  Status post 2 units in the operating room.  ID: Awaiting results of am CBC. WBC increased to 18.2 yesterday AM. Afebrile. Currently on zosyn  for bowel perforation from recent D&C. S/P exploratory laparotomy with colectomy and ostomy creation. Per discussion with  IR, air filled collection felt to be related to be intraluminal in distal rectosigmoid stump with inflammation in colon from postop  setting and diverticulitis. No indication for drain placement.   CV: On monitoring. BP and HR overall stable at this time. Mild tachycardia improving. Awaiting PLT count for this am, stable.  GI:  Diet: NPO. Started on TPN 03/06/24. On mIVF. Has small amount of serous output in ostomy, no gas. Stoma with pink mucosa centrally.   GU: Creatinine 0.83.  Improving.  On TPN and mIVF while NPO. Purewick in place. Adequate output reported.   FEN: Am metabolic panel reviewed. Repletion as needed per pharmacy.    Endo: Diabetes. CBG (last 3)  Recent Labs    03/06/24 2324 03/07/24 0339 03/07/24 0759  GLUCAP 106* 136* 139*     Prophylaxis: SCDs, heparin  held at this time due to thrombocytopenia.  Plan: Awaiting results of am CBC Gabapentin  reordered per home regimen (300 mg TID) to see if this assists with improved pain control. Will monitor for increased sedation. Physical therapy consult placed.  Continue with current plan of care per Dr. [name] and Hospitalist Team. Continue to monitor I&Os with purewick. Continue NPO except sips with meds until gas in ostomy.  Appreciate WO evaluation, management. Continue daily labs.   LOS: 7 days    Alexandria Durham 03/07/2024, 8:14 AM

## 2024-03-07 NOTE — Progress Notes (Signed)
 PHARMACY - TOTAL PARENTERAL NUTRITION CONSULT NOTE   Indication: Prolonged ileus  Patient Measurements: Height: 5' 4 (162.6 cm) Weight: 79.1 kg (174 lb 6.1 oz) IBW/kg (Calculated) : 54.7 TPN AdjBW (KG): 60.8 Body mass index is 29.93 kg/m.  Assessment: 78 year old female s/p hysterectomy with D&C on 7/1. Concern for postoperative bowel perforation. Pt back to OR on 7/2 for ex lap with colectomy and ostomy creation. Pharmacy consulted for TPN.  Glucose / Insulin : pt on Januvia per PTA med list -CBGs 90-139 -4u SSI so far this morning -Stopping D10 Electrolytes:  -K 3.4, phos 1.2, Mg 1.6  -CorrCa WNL Renal: Scr improved Hepatic: LFTs slightly elevated; alb 2.1 Intake / Output; MIVF:  -D10 at 35 ml/hr -UOP adequate -Drain 40 ml last 24 hr -Stool 30 ml x 2 yesterday GI Imaging: -7/6 CT A/P 4.6 cm diameter air-fluid level in the anterior pelvis in or adjacent to the sigmoid colon; this may represent an abscess or an air-fluid level within a dilated segment of colon GI Surgeries / Procedures:  -7/2 ex lap, colectomy w/ colostomy -7/1 hysterectomy  Central access: PICC TPN start date: 7/7  Nutritional Goals: Goal TPN rate is 75 mL/hr (provides 104 g of protein and 1778 kcals per day)  RD Assessment: Estimated Needs Total Energy Estimated Needs: 1750-1900 kcals Total Protein Estimated Needs: 90-110 grams Total Fluid Estimated Needs: >/= 1.8L  Current Nutrition: NPO  Plan:  -Potassium phos 30 mmol IV x 1 + Mg 2 g IV x 1 -Continue TPN at 40 mL/hr - defer advancing TPN today given drop in electrolytes concerning for refeeding -Electrolytes in TPN: Na 50 mEq/L, K 50 mEq/L, Ca 5 mEq/L, Mg 5 mEq/L, and Phos 15 mmol/L. Cl:Ac 1:1 -Add standard MVI and trace elements to TPN - consider changing to PO once oral intake more reliable -Continue moderate SSI q4h -Stop D10 -Thiamine  x 6 days for refeeding risk -Monitor TPN labs on Mon/Thurs, daily while monitoring for  refeeding  Stefano MARLA Bologna, PharmD, BCPS Clinical Pharmacist 03/07/2024 10:03 AM

## 2024-03-07 NOTE — Progress Notes (Signed)
   Inpatient Rehab Admissions Coordinator :  Per therapy recommendations patient was screened for CIR candidacy by Ottie Glazier RN MSN. Patient is not yet at a level to tolerate the intensity required to pursue a CIR admit . The CIR admissions team will follow and monitor for progress and place a Rehab Consult order if felt to be appropriate. Please contact me with any questions.  Ottie Glazier RN MSN Admissions Coordinator [phone]

## 2024-03-07 NOTE — Progress Notes (Signed)
 PROGRESS NOTE    Alexandria Durham  FMW:[id number] DOB: October 08, 1945 DOA: 02/29/2024 PCP: [name], NP   Brief Narrative:  Alexandria Durham is a 78 y.o.  F with PMH significant for thickened endometrium, HTN, Type 2 DM, CKD stage IIIa, recent pancreatitis and acute cholecystitis s/p lap chole and D&C in May of this year who was admitted 7/1 for hysteroscopy and repeat D&C.  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 Pt was acidotic post-procedure, so decision made to leave intubated, PCCM consulted in this setting.  Significant events as below.  Patient was eventually transferred under [facility] to [facility] on 03/07/2024 while GYN oncology remains the primary service.  Significant Hospital Events: Including procedures, antibiotic start and stop dates in addition to other pertinent events   7/2 admit to ICU post-op, ventilated  7/3 pt remains on low dose neo, weaned off sedation but mental status still precludes SBT 7/4 extubated and weaned off pressors 7/6: chest pain yesterday with flat trops, non-ischemic EKG. CT CAP with diverticulitis of sigmoid. Had CT head which was negative.     Assessment & Plan:   Principal Problem:   Postmenopausal bleeding Active Problems:   Protein-calorie malnutrition, severe  S/p D&C procedure complicated by bowel perforation S/p partial colectomy and ileostomy  Septic shock, shock resolved  Presented for d&c on 7/1 c/b bowel perforation. OR 7/2 after development of septic shock, zosyn  started. Had ex lap with sigmoidoscopy, partial collection and ileostomy placement. Now off pressors. CT AP 7/6 showing sigmoid diverticulitis, air-fluid level 4.6cm unable to discern abscess vs segment of colon.  - ongoing leukocytosis up to 18.2 on 03/06/2024 while patient continued to be on Zosyn , white cells have improved to 11.8 today.   Primary service was going to talk to IR for possible abscess drainage.  PICC line was placed and patient started on TPN.  Management per primary service.   Chest pain  7/6 complained of chest pressure. Trop x2 flat @ 22. EKG non ischemic. CXR with no acute findings.  Monitor on telemetry.   Hypokalemia: Low again, will replenish.  AKI on CKD stage IIIa: Baseline creatinine around 1.2.  Creatinine peaked to 2.07 which has improved better than baseline.    Acute Thrombocytopenia  No signs of active bleeding and hemoglobin is stable. Likely result of critical illness.  - trend and transfuse prn  - no heparin  for now    Type 2 DM Hypoglycemia: Episodes of hypoglycemia. Was started on D5LR which has been discontinued.  Patient's blood sugar is now controlled.   Hypothyroidism  - con't synthroid  75mcg daily - NPO so cannot receive   Hyperlipidemia  HTN - con't zetia  10mg  daily - NPO to cannot receive  - hydralazine  prn for SBP > 180  DVT prophylaxis: heparin  injection 5,000 Units Start: 03/07/24 1400 SCDs Start: 02/29/24 1403   Code Status: Full Code  Family Communication:  None present at bedside.  Plan of care discussed with patient in length and he/she verbalized understanding and agreed with it.  Status is: Inpatient Remains inpatient appropriate because: Disposition per primary team.   Estimated body mass index is 29.93 kg/m as calculated from the following:   Height as of this encounter: 5' 4 (1.626 m).   Weight as of this encounter: 79.1 kg.    Nutritional Assessment: Body mass index is 29.93 kg/m.[name]  Seen by dietician.  I agree with the assessment and plan as outlined below: Nutrition Status: Nutrition Problem: Severe Malnutrition Etiology: acute illness Signs/Symptoms: energy intake < or equal to 50% for > or equal to 5 days, mild muscle depletion, percent weight loss (10% in 1.5 months) Percent weight loss: 10 % (in 1.5 months) Interventions: Refer to RD note for  recommendations  . Skin Assessment: I have examined the patient's skin and I agree with the wound assessment as performed by the wound care RN as outlined below:    Consultants:  [facility]  Procedures:  As above  Antimicrobials:  Anti-infectives (From admission, onward)    Start     Dose/Rate Route Frequency Ordered Stop   03/01/24 1200  piperacillin -tazobactam (ZOSYN ) IVPB 3.375 g  Status:  Discontinued        3.375 g 12.5 mL/hr over 240 Minutes Intravenous Every 8 hours 03/01/24 0946 03/01/24 1116   03/01/24 1130  piperacillin -tazobactam (ZOSYN ) IVPB 3.375 g        3.375 g 12.5 mL/hr over 240 Minutes Intravenous Every 8 hours 03/01/24 1116           Subjective: Patient seen and examined, other than abdominal pain, she had no complaints.  She was fully alert and oriented.  She denied any chest pain or shortness of breath.  Objective: Vitals:   03/07/24 1000 03/07/24 1048 03/07/24 1100 03/07/24 1200  BP: (!) 162/104  (!) 178/62 (!) 159/69  Pulse: (!) 102 95 94 92  Resp: (!) 25 (!) 26 (!) 28 (!) 25  Temp:      TempSrc:      SpO2: 97% 98% 100% 98%  Weight:      Height:        Intake/Output Summary (Last 24 hours) at 03/07/2024 1331 Last data filed at 03/07/2024 1310 Gross per 24 hour  Intake 2472.74 ml  Output 2510 ml  Net -37.26 ml   Filed Weights   03/01/24 1127 03/01/24 1823 03/06/24 0200  Weight: 64 kg 72.6 kg 79.1 kg    Examination:  General exam: Appears calm and comfortable  Respiratory system: Clear to auscultation. Respiratory effort normal. Cardiovascular system: S1 & S2 heard, RRR. No JVD, murmurs, rubs, gallops or clicks. No pedal edema. Gastrointestinal system: Abdomen is nondistended, soft and generalized tenderness with wound VAC attached in colostomy bag. Central nervous system: Alert and oriented. No focal neurological deficits. Extremities: Symmetric 5 x 5 power. Skin: No rashes, lesions or ulcers Psychiatry: Judgement and insight appear normal.  Mood & affect appropriate.    Data Reviewed: I have personally reviewed following labs and imaging studies  CBC: Recent Labs  Lab 03/03/24 0406 03/04/24 0413 03/05/24 0441 03/06/24 0447 03/07/24 1210  WBC 5.1 6.7 16.3* 18.2* 11.8*  HGB 13.9 11.9* 11.7* 10.9* 11.5*  HCT 40.1 35.4* 34.9* 32.5* 32.3*  MCV 87.4 88.7 88.8 89.0 85.2  PLT 79* 55* 55* 54* 62*   Basic Metabolic Panel: Recent Labs  Lab 03/01/24 2005 03/02/24 0340 03/03/24 0406 03/04/24 0413 03/05/24 0441 03/06/24 0125 03/06/24 0447 03/07/24 0359  NA 130* 128* 133* 137 133* 136 135 135  K 4.0 3.8 3.4* 3.4* 3.3* 3.6 3.4* 3.4*  CL 99 103 102 107 106 110 109 105  CO2 17* 17* 15* 18* 18* 17* 17* 22  GLUCOSE 151* 139* 84 95 150* 88 124* 153*  BUN 17 17 22 21 17 13 13 16   CREATININE 1.58* 1.75* 2.07* 1.75* 1.32* 1.28* 1.19* 0.83  CALCIUM  8.3* 7.8*  7.8* 7.9* 7.3* 7.5* 7.1* 7.8*  MG 0.8* 4.3* 2.2  --   --   --   --  1.6*  PHOS  --   --  3.6  --   --   --   --  1.2*   GFR: Estimated Creatinine Clearance: 58.7 mL/min (by C-G formula based on SCr of 0.83 mg/dL). Liver Function Tests: Recent Labs  Lab 03/02/24 0340 03/04/24 0413 03/07/24 0359  AST 182* 106* 52*  ALT 158* 209* 76*  ALKPHOS 34* 65 123  BILITOT 1.4* 1.3* 1.6*  PROT 4.8* 4.5* 4.8*  ALBUMIN  2.8* 2.2* 2.1*   No results for input(s): LIPASE, AMYLASE in the last 168 hours. No results for input(s): AMMONIA in the last 168 hours. Coagulation Profile: No results for input(s): INR, PROTIME in the last 168 hours. Cardiac Enzymes: No results for input(s): CKTOTAL, CKMB, CKMBINDEX, TROPONINI in the last 168 hours. BNP (last 3 results) No results for input(s): PROBNP in the last 8760 hours. HbA1C: No results for input(s): HGBA1C in the last 72 hours. CBG: Recent Labs  Lab 03/06/24 1934 03/06/24 2324 03/07/24 0339 03/07/24 0759 03/07/24 1141  GLUCAP 110* 106* 136* 139* 164*   Lipid Profile: No results for input(s): CHOL,  HDL, LDLCALC, TRIG, CHOLHDL, LDLDIRECT in the last 72 hours. Thyroid  Function Tests: No results for input(s): TSH, T4TOTAL, FREET4, T3FREE, THYROIDAB in the last 72 hours. Anemia Panel: No results for input(s): VITAMINB12, FOLATE, FERRITIN, TIBC, IRON, RETICCTPCT in the last 72 hours. Sepsis Labs: Recent Labs  Lab 03/01/24 2006 03/02/24 0340  LATICACIDVEN 3.6* 3.1*    Recent Results (from the past 240 hours)  Urine Culture     Status: None   Collection Time: 03/01/24  5:57 PM   Specimen: Urine, Catheterized  Result Value Ref Range Status   Specimen Description   Final    URINE, CATHETERIZED Performed at [facility], [address], [place], [place] [address]    Special Requests   Final    NONE Performed at [facility], [address] [place], [place] [address]    Culture   Final    NO GROWTH Performed at [facility], [address] [place], [place] [address]    Report Status 03/02/2024 FINAL  Final  MRSA Next Gen by PCR, Nasal     Status: None   Collection Time: 03/02/24  9:49 AM   Specimen: Nasal Mucosa; Nasal Swab  Result Value Ref Range Status   MRSA by PCR Next Gen NOT DETECTED NOT DETECTED Final    Comment: (NOTE) The GeneXpert MRSA Assay (FDA approved for NASAL specimens only), is one component of a comprehensive MRSA colonization surveillance program. It is not intended to diagnose MRSA infection nor to guide or monitor treatment for MRSA infections. Test performance is not FDA approved in patients less than 57 years old. Performed at [facility], [address], [place], [place] [address]      Radiology Studies: US  EKG SITE RITE Result Date: 03/06/2024 If Site Rite image not attached, placement could not be confirmed due to current cardiac rhythm.  CT CHEST ABDOMEN PELVIS WO CONTRAST Result Date: 03/05/2024 CLINICAL DATA:  Sepsis with increasing white count  and delirium. Possible postoperative infection. EXAM: CT CHEST, ABDOMEN AND PELVIS WITHOUT CONTRAST TECHNIQUE: Multidetector CT imaging of the chest, abdomen and pelvis was performed following the standard protocol without IV contrast. RADIATION DOSE REDUCTION: This exam was performed according to the departmental  dose-optimization program which includes automated exposure control, adjustment of the mA and/or kV according to patient size and/or use of iterative reconstruction technique. COMPARISON:  Chest radiograph 03/05/2024. CT abdomen and pelvis 02/13/2024 FINDINGS: CT CHEST FINDINGS Cardiovascular: Central venous catheter with tip in the upper SVC. Normal heart size. No pericardial effusions. Calcification of the aorta. No aneurysm. Mediastinum/Nodes: No enlarged mediastinal, hilar, or axillary lymph nodes. Thyroid  gland, trachea, and esophagus demonstrate no significant findings. Lungs/Pleura: Motion artifact. Small bilateral pleural effusions with bilateral basilar consolidation or atelectasis. Diffuse emphysematous changes in the lungs. No pneumothorax. Musculoskeletal: Degenerative changes in the spine. No acute bony abnormalities. CT ABDOMEN PELVIS FINDINGS Hepatobiliary: No focal liver abnormality is seen. Status post cholecystectomy. No biliary dilatation. Pancreas: Unremarkable. No pancreatic ductal dilatation or surrounding inflammatory changes. Spleen: Normal in size without focal abnormality. Adrenals/Urinary Tract: Adrenal glands are unremarkable. Kidneys are normal, without renal calculi, focal lesion, or hydronephrosis. Bladder is unremarkable. Stomach/Bowel: Stomach, small bowel, and colon are mostly decompressed. Colonic diverticulosis. There is wall thickening and stranding of the sigmoid colon consistent with acute diverticulitis. There are postoperative changes with a diverting left lower quadrant descending colostomy. There is a superior pelvic air-fluid level measuring about 4.6 cm in  diameter. This difficult to determine whether this represents a pericolonic loculated collection/abscess or whether this is within a segment of the colon. Consider obtaining CT images with rectal contrast material. A left lower quadrant pelvic drain catheter is in place. Vascular/Lymphatic: Aortic atherosclerosis. No enlarged abdominal or pelvic lymph nodes. Reproductive: No pelvic mass identified. Other: Small amount of free fluid in the pelvis is likely reactive or postoperative. Anterior abdominal wall defect consistent with surgical wound. Diffuse edema throughout the subcutaneous fatty tissues. Musculoskeletal: Postoperative right hip arthroplasty. Mild anterior subluxation of L4 on L5, probably degenerative. Degenerative changes in the lumbar spine. 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 IMPRESSION: 1. Colonic diverticulosis with evidence of acute diverticulitis in the sigmoid region. 2. Postoperative diverting left lower quadrant descending colostomy. Surgical drain in the pelvis. 3. Small amount of free fluid in the pelvis is likely reactive or postoperative. 4. 4.6 cm diameter air-fluid level in the anterior pelvis in or adjacent to the sigmoid colon. This may represent an abscess or an air-fluid level within a dilated segment of colon. Repeat imaging with rectal contrast material may help to distinguish. 5. Bilateral pleural effusions with basilar atelectasis or infiltration. This may represent compressive atelectasis or pneumonia. 6. Aortic atherosclerosis. 7. Anterior abdominal wall surgical wound. Diffuse subcutaneous edema. Electronically Signed   By: Elsie Gravely M.D.   On: 03/05/2024 22:25   CT HEAD WO CONTRAST ( ) Result Date: 03/05/2024 CLINICAL DATA:  Mental status change EXAM: CT HEAD WITHOUT CONTRAST TECHNIQUE: Contiguous axial images were obtained from the base of the skull through the vertex without intravenous contrast. RADIATION DOSE  REDUCTION: This exam was performed according to the departmental dose-optimization program which includes automated exposure control, adjustment of the mA and/or kV according to patient size and/or use of iterative reconstruction technique. COMPARISON:  None Available. FINDINGS: Brain: No hemorrhage or intracranial mass. Mild atrophy. Patchy white matter hypodensity likely chronic small vessel ischemic change. Probable small chronic lacunar infarct left basal ganglia. Suspect small chronic infarcts within the right basal ganglia and white matter. Ventricles are nonenlarged. Vascular: No hyperdense vessels.  Carotid vascular calcification Skull: Normal. Negative for fracture or focal lesion. Sinuses/Orbits: No acute finding. Other: None IMPRESSION: 1. Atrophy and chronic small vessel ischemic changes  of the white matter. 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 2. Negative for hemorrhage, mass or large vessel territorial infarct. Electronically Signed   By: Luke Bun M.D.   On: 03/05/2024 22:23    Scheduled Meds:  buPROPion   150 mg Oral QPM   Chlorhexidine  Gluconate Cloth  6 each Topical Daily   ezetimibe   10 mg Per Tube Daily   gabapentin   300 mg Oral TID   heparin  injection (subcutaneous)  5,000 Units Subcutaneous Q8H   insulin  aspart  0-15 Units Subcutaneous Q4H   levothyroxine   75 mcg Per Tube QAC breakfast   lidocaine   1 patch Transdermal Q24H   melatonin  3 mg Oral QHS   pantoprazole  (PROTONIX ) IV  40 mg Intravenous Q24H   polyethylene glycol  17 g Oral Daily   sodium chloride  flush  10-40 mL Intracatheter Q12H   thiamine  (VITAMIN B1) injection  100 mg Intravenous Q24H   Continuous Infusions:  piperacillin -tazobactam (ZOSYN )  IV 12.5 mL/hr at 03/07/24 1310   potassium PHOSPHATE  IVPB (in mmol) 85 mL/hr at 03/07/24 1310   TPN ADULT (ION) 40 mL/hr at 03/07/24 1310   TPN ADULT (ION)       LOS: 7 days   Fredia Skeeter, MD [facility]  03/07/2024, 1:31 PM   *Please note that this is a verbal dictation therefore any spelling or grammatical errors are due to the Dragon Medical One system interpretation.  Please page via Amion and do not message via secure chat for urgent patient care matters. Secure chat can be used for non urgent patient care matters.  How to contact the [facility] Attending or Consulting provider 7A - 7P or covering provider during after hours 7P -7A, for this patient?  Check the care team in [facility] and look for a) attending/consulting [facility] provider listed and b) the [facility] team listed. Page or secure chat 7A-7P. Log into [email or web address] and use [facility]'s universal password to access. If you do not have the password, please contact the hospital operator. Locate the [facility] provider you are looking for under [facility] and page to a number that you can be directly reached. If you still have difficulty reaching the provider, please page the [facility] (Director on Call) for the Hospitalists listed on amion for assistance.

## 2024-03-08 DIAGNOSIS — N950 Postmenopausal bleeding: Secondary | ICD-10-CM | POA: Diagnosis not present

## 2024-03-08 LAB — CBC
HCT: 30.9 % — ABNORMAL LOW (ref 36.0–46.0)
Hemoglobin: 10.6 g/dL — ABNORMAL LOW (ref 12.0–15.0)
MCH: 29.9 pg (ref 26.0–34.0)
MCHC: 34.3 g/dL (ref 30.0–36.0)
MCV: 87 fL (ref 80.0–100.0)
Platelets: 65 K/uL — ABNORMAL LOW (ref 150–400)
RBC: 3.55 MIL/uL — ABNORMAL LOW (ref 3.87–5.11)
RDW: 17.2 % — ABNORMAL HIGH (ref 11.5–15.5)
WBC: 6.8 K/uL (ref 4.0–10.5)
nRBC: 0 % (ref 0.0–0.2)

## 2024-03-08 LAB — BASIC METABOLIC PANEL WITH GFR
Anion gap: 7 (ref 5–15)
BUN: 21 mg/dL (ref 8–23)
CO2: 24 mmol/L (ref 22–32)
Calcium: 7.5 mg/dL — ABNORMAL LOW (ref 8.9–10.3)
Chloride: 101 mmol/L (ref 98–111)
Creatinine, Ser: 0.95 mg/dL (ref 0.44–1.00)
GFR, Estimated: >60 mL/min (ref >60)
Glucose, Bld: 138 mg/dL — ABNORMAL HIGH (ref 70–99)
Potassium: 3.3 mmol/L — ABNORMAL LOW (ref 3.5–5.1)
Sodium: 132 mmol/L — ABNORMAL LOW (ref 135–145)

## 2024-03-08 LAB — GLUCOSE, CAPILLARY
Glucose-Capillary: 124 mg/dL — ABNORMAL HIGH (ref 70–99)
Glucose-Capillary: 130 mg/dL — ABNORMAL HIGH (ref 70–99)
Glucose-Capillary: 136 mg/dL — ABNORMAL HIGH (ref 70–99)
Glucose-Capillary: 141 mg/dL — ABNORMAL HIGH (ref 70–99)
Glucose-Capillary: 142 mg/dL — ABNORMAL HIGH (ref 70–99)
Glucose-Capillary: 145 mg/dL — ABNORMAL HIGH (ref 70–99)

## 2024-03-08 LAB — MAGNESIUM: Magnesium: 2.1 mg/dL (ref 1.7–2.4)

## 2024-03-08 LAB — PHOSPHORUS: Phosphorus: 2.3 mg/dL — ABNORMAL LOW (ref 2.5–4.6)

## 2024-03-08 MED ORDER — POTASSIUM PHOSPHATES 15 MMOLE/5ML IV SOLN
30.0000 mmol | Freq: Once | INTRAVENOUS | Status: AC
  Administered 2024-03-08: 30 mmol via INTRAVENOUS
  Filled 2024-03-08: qty 10

## 2024-03-08 MED ORDER — HYDROMORPHONE HCL 1 MG/ML IJ SOLN
1.0000 mg | INTRAMUSCULAR | Status: DC | PRN
  Administered 2024-03-08 – 2024-04-03 (×69): 1 mg via INTRAVENOUS
  Filled 2024-03-08 (×70): qty 1

## 2024-03-08 MED ORDER — TRAVASOL 10 % IV SOLN
INTRAVENOUS | Status: AC
  Filled 2024-03-08: qty 556.8

## 2024-03-08 MED ORDER — METHOCARBAMOL 500 MG PO TABS
500.0000 mg | ORAL_TABLET | Freq: Three times a day (TID) | ORAL | Status: DC | PRN
  Administered 2024-03-08 – 2024-03-18 (×6): 500 mg via ORAL
  Filled 2024-03-08 (×8): qty 1

## 2024-03-08 MED ORDER — GABAPENTIN 300 MG PO CAPS
300.0000 mg | ORAL_CAPSULE | Freq: Three times a day (TID) | ORAL | Status: AC
  Administered 2024-03-08 – 2024-03-09 (×3): 300 mg via ORAL
  Filled 2024-03-08 (×3): qty 1

## 2024-03-08 MED ORDER — POTASSIUM CHLORIDE CRYS ER 20 MEQ PO TBCR
40.0000 meq | EXTENDED_RELEASE_TABLET | ORAL | Status: AC
  Administered 2024-03-08 (×2): 40 meq via ORAL
  Filled 2024-03-08 (×2): qty 2

## 2024-03-08 NOTE — Evaluation (Signed)
 Occupational Therapy Evaluation Patient Details Name: Alexandria Durham MRN: [id number] DOB: [birth year] Today's Date: 03/08/2024   History of Present Illness   Patient is a 78 year old female who presented on 03/01/2024 following a D and C on 7/1 for thickened endometrium and procedure complicated due to bowel perforation. Pt developed signs and symptoms of sepsis and shock. 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  Pt required post op ventilation and extubated on 7/4.  Pt PMH includes but is not limited to:  recent hospital admissions for diverticulitis, pancreatitis, abdominal pain, D&C  01/06/24 with uterine perforation, cholecystectomy and d&C on 01/17/24, DM II, HTN, COPD, GERD and CKD III.     Clinical Impressions PTA, patient was living alone and was mod I with [facility] for A/IADL's including driving and attending church but reports issues with B rotator cuff's L>R at baseline. Daughter lives locally for support.  Currently, patient presents with deficits outlined below (see OT Problem List for details) most significantly pain (wound vac with new ostomy, jp drain, rectal tube), generalized muscle weakness, B UE ROM and coordination deficits, decreased activity tolerance, balance and cognition impacting BADL's (max-total A overall) and functional mobility (stood and stepped bedside with +2 mod A). Patient open to all therapy presented this session with strong effort put forth and motivation despite pain and prolonged bed rest. Patient requires continued Acute care hospital level OT services to progress safety and functional performance and allow for discharge. Patient will benefit from intensive inpatient follow-up therapy, >3 hours/day.        If plan is discharge home, recommend the following:   Two people to help with walking and/or transfers;Two people to help with bathing/dressing/bathroom;Assistance with cooking/housework;Assistance with feeding;Direct supervision/assist for  medications management;Direct supervision/assist for financial management;Assist for transportation;Help with stairs or ramp for entrance;Supervision due to cognitive status     Functional Status Assessment   Patient has had a recent decline in their functional status and demonstrates the ability to make significant improvements in function in a reasonable and predictable amount of time.     Equipment Recommendations   None recommended by OT (TBD post rehab venue)      Precautions/Restrictions   Precautions Precautions: Fall Recall of Precautions/Restrictions: Intact Precaution/Restrictions Comments: Abdominal sx - wound vac, JP drain, ostomy, abdominal binder- ned clarification from MD due to wound vac and new open stoma Required Braces or Orthoses: Other Brace (abdominal binder- clarify with MD due to wound vac) Restrictions Weight Bearing Restrictions Per Provider Order: No     Mobility Bed Mobility Overal bed mobility: Needs Assistance Bed Mobility: Rolling, Sidelying to Sit, Sit to Sidelying Rolling: Max assist Sidelying to sit: Mod assist, +2 for physical assistance Supine to sit: HOB elevated, Used rails, Supervision   Sit to sidelying: Max assist, +2 for physical assistance General bed mobility comments: + 2 for management of lines, drain and tubes    Transfers Overall transfer level: Needs assistance Equipment used: 2 person hand held assist Transfers: Sit to/from Stand Sit to Stand: Mod assist, +2 physical assistance, +2 safety/equipment, From elevated surface           General transfer comment: STS then side stepping x 6-8 stels laterally with arm and arm support on each side +2      Balance Overall balance assessment: Needs assistance Sitting-balance support: Single extremity supported, Feet supported Sitting balance-Leahy Scale: Poor Sitting balance - Comments: progressed to fair after 2 min sitting   Standing balance support: Bilateral  upper  extremity supported Standing balance-Leahy Scale: Poor Standing balance comment: side stepped up to [facility]                           ADL either performed or assessed with clinical judgement   ADL Overall ADL's : Needs assistance/impaired Eating/Feeding: NPO   Grooming: Maximal assistance;Wash/dry hands;Wash/dry face Grooming Details (indicate cue type and reason): B UE weakness Upper Body Bathing: Maximal assistance;Bed level   Lower Body Bathing: Total assistance;Bed level   Upper Body Dressing : Total assistance;Bed level   Lower Body Dressing: Total assistance;Bed level     Toilet Transfer Details (indicate cue type and reason): stood bedside and side stepped with + 2 mod A Toileting- Clothing Manipulation and Hygiene: Total assistance Toileting - Clothing Manipulation Details (indicate cue type and reason): has rectal tube, ostomy and Purewick in place     Functional mobility during ADLs: Moderate assistance;+2 for physical assistance;+2 for safety/equipment General ADL Comments: prolonged bedrest, weakness and low activity tolerance inhibits function     Vision Baseline Vision/History: 1 Wears glasses;0 No visual deficits Ability to See in Adequate Light: 0 Adequate Patient Visual Report: No change from baseline Vision Assessment?: No apparent visual deficits Additional Comments: maintained eyes closed during portions of session            Pertinent Vitals/Pain Pain Assessment Pain Assessment: Faces Faces Pain Scale: Hurts worst Breathing: normal Negative Vocalization: occasional moan/groan, low speech, negative/disapproving quality Facial Expression: sad, frightened, frown Body Language: tense, distressed pacing, fidgeting Consolability: no need to console PAINAD Score: 3 Facial Expression: Tense Body Movements: Protection Muscle Tension: Tense, rigid Compliance with ventilator (intubated pts.): N/A Vocalization (extubated pts.): Talking in normal  tone or no sound CPOT Total: 3 Pain Location: abdomen Pain Descriptors / Indicators: Guarding, Grimacing Pain Intervention(s): Limited activity within patient's tolerance, Monitored during session, Premedicated before session, Repositioned, Relaxation     Extremity/Trunk Assessment Upper Extremity Assessment Upper Extremity Assessment: Right hand dominant;Generalized weakness;RUE deficits/detail;LUE deficits/detail RUE Deficits / Details: patient reports hx of RTC issues with ~ 0-55 degrees active shoulder ROM, enough to reach face with wash cloth and complete full grasp ~ 4-/5 distal gross strength RUE Coordination: decreased gross motor;decreased fine motor LUE Deficits / Details: patient reports hx of L RTC issues with ~ 0-20 degrees shoulder AROM with 3/5 gross grasp stregnth, unable to reach to face LUE Coordination: decreased fine motor;decreased gross motor   Lower Extremity Assessment Lower Extremity Assessment: Defer to PT evaluation   Cervical / Trunk Assessment Cervical / Trunk Assessment: Other exceptions Cervical / Trunk Exceptions: core/trunk weakness   Communication Communication Communication: Impaired Factors Affecting Communication: Reduced clarity of speech   Cognition Arousal: Lethargic Behavior During Therapy: Flat affect Cognition: Cognition impaired   Orientation impairments: Time Awareness: Online awareness impaired Memory impairment (select all impairments): Short-term memory Attention impairment (select first level of impairment): Sustained attention Executive functioning impairment (select all impairments): Initiation OT - Cognition Comments: sleepiness/lethargy impacting recall and course of care, needed reinformcement multiple times to remind patient re: rectal tube and wound vac, slower processing, cooperative with some impulsivity, able to quantify dizziness to express level of dizziness                 Following commands: Intact        Cueing  General Comments   Cueing Techniques: Verbal cues;Gestural cues;Tactile cues  VSS on RA, decreased tolerance due to wound vac, rectal tube,  drain and ostomy with abdominal pain, mild dizziness reported EOB then resolved and patient was motivated to stand >3-4 minutes with support           Home Living Family/patient expects to be discharged to:: Private residence Living Arrangements: Alone Available Help at Discharge: Family;Available PRN/intermittently Type of Home: House Home Access: Stairs to enter [employer] of Steps: 4 Entrance Stairs-Rails: None Home Layout: One level Alternate Level Stairs-Number of Steps: 1 step up to laundry and kitchen   Bathroom Shower/Tub: Chief Strategy Officer: Handicapped height Bathroom Accessibility: Yes How Accessible: Accessible via walker Home Equipment: Cane - single point;Rolling Walker (2 wheels)          Prior Functioning/Environment Prior Level of Function : Independent/Modified Independent             Mobility Comments: Ambulating with cane, could ambulate in community (liked going to sports games with family); Pt had recent hospital admission and stayed with dtr at d/c ADLs Comments: Independent adls and iadls and reports drving and church    OT Problem List: Decreased strength;Decreased range of motion;Decreased activity tolerance;Impaired balance (sitting and/or standing);Decreased coordination;Decreased cognition;Decreased safety awareness;Decreased knowledge of use of DME or AE;Decreased knowledge of precautions;Cardiopulmonary status limiting activity;Impaired UE functional use;Pain   OT Treatment/Interventions: Self-care/ADL training;Therapeutic exercise;Neuromuscular education;Energy conservation;DME and/or AE instruction;Therapeutic activities;Cognitive remediation/compensation;Patient/family education;Balance training      OT Goals(Current goals can be found in the care plan section)    Acute Rehab OT Goals Patient Stated Goal: to feel less pain OT Goal Formulation: With patient Time For Goal Achievement: 03/22/24 Potential to Achieve Goals: Good ADL Goals Pt Will Perform Eating: with set-up;sitting Pt Will Perform Grooming: with set-up;sitting Pt Will Perform Upper Body Bathing: with set-up;sitting Pt Will Perform Upper Body Dressing: with min assist;sitting Pt Will Transfer to Toilet: with min assist;bedside commode Pt/caregiver will Perform Home Exercise Program: Both right and left upper extremity;With Supervision;With written HEP provided;With theraputty   OT Frequency:  Min 2X/week       AM-PAC OT 6 Clicks Daily Activity     Outcome Measure Help from another person eating meals?: Total Help from another person taking care of personal grooming?: A Lot Help from another person toileting, which includes using toliet, bedpan, or urinal?: Total Help from another person bathing (including washing, rinsing, drying)?: Total  Help from another person to put on and taking off regular upper body clothing?:Total  Help from another person to put on and taking off regular lower body clothing?: Total 6 Click Score: 7   End of Session Equipment Utilized During Treatment: Gait belt Nurse Communication: Mobility status;Other (comment) (nursing to clarify abdominal binder needs due to wound vac)  Activity Tolerance: Patient limited by fatigue;Patient limited by pain Patient left: in bed;with call bell/phone within reach;with bed alarm set  OT Visit Diagnosis: Unsteadiness on feet (R26.81);Muscle weakness (generalized) (M62.81);Feeding difficulties (R63.3);Cognitive communication deficit (R41.841);Pain Pain - part of body:  (abdomen)                Time: 8951-8859 OT Time Calculation (min): 52 min Charges:  OT General Charges $OT Visit: 1 Visit OT Evaluation $OT Eval Moderate Complexity: 1 Mod OT Treatments $Therapeutic Activity: 23-37 mins  Khloey Chern OT/L Acute  Rehabilitation Department  [phone]  03/08/2024, 2:16 PM

## 2024-03-08 NOTE — Progress Notes (Signed)
 PROGRESS NOTE    Alexandria Durham  FMW:[id number] DOB: 02/24/1946 DOA: 02/29/2024 PCP: [name], NP   Brief Narrative:  Alexandria Durham is a 78 y.o.  F with PMH significant for thickened endometrium, HTN, Type 2 DM, CKD stage IIIa, recent pancreatitis and acute cholecystitis s/p lap chole and D&C in May of this year who was admitted 7/1 for hysteroscopy and repeat D&C.  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 Pt was acidotic post-procedure, so decision made to leave intubated, PCCM consulted in this setting.  Significant events as below.  Patient was eventually transferred under [facility] to comanage on 03/07/2024 while GYN oncology remains the primary service.  Significant Hospital Events: Including procedures, antibiotic start and stop dates in addition to other pertinent events   7/2 admit to ICU post-op, ventilated  7/3 pt remains on low dose neo, weaned off sedation but mental status still precludes SBT 7/4 extubated and weaned off pressors 7/6: chest pain yesterday with flat trops, non-ischemic EKG. CT CAP with diverticulitis of sigmoid. Had CT head which was negative.  7/8-patient transferred under [facility] for continuation of the comanagement.    Assessment & Plan:   Principal Problem:   Postmenopausal bleeding Active Problems:   Protein-calorie malnutrition, severe  S/p D&C procedure complicated by bowel perforation S/p partial colectomy and ileostomy  Septic shock, shock resolved  Presented for d&c on 7/1 c/b bowel perforation. OR 7/2 after development of septic shock, zosyn  started. Had ex lap with sigmoidoscopy, partial collection and ileostomy placement. Now off pressors. CT AP 7/6 showing sigmoid diverticulitis, air-fluid level 4.6cm unable to discern abscess vs segment of colon.  - ongoing leukocytosis up to 18.2 on 03/06/2024 while patient  continued to be on Zosyn , white cells have improved to 11.8 today.  Primary service was going to talk to IR for possible abscess drainage.  PICC line was placed and patient started on TPN.  Management per primary service.   Chest pain  7/6 complained of chest pressure. Trop x2 flat @ 22. EKG non ischemic. CXR with no acute findings.  Monitor on telemetry.   Hypokalemia: Again low, will replenish.  AKI on CKD stage IIIa: Baseline creatinine around 1.2.  Creatinine peaked to 2.07 which has improved better than baseline.    Acute Thrombocytopenia  No signs of active bleeding and hemoglobin is stable. Likely result of critical illness.  - trend and transfuse prn  - no heparin  for now    Type 2 DM Hypoglycemia: Episodes of hypoglycemia. Was started on D5LR which has been discontinued.  Patient's blood sugar is now controlled.   Hypothyroidism  - con't synthroid  75mcg daily - NPO so cannot receive   Hyperlipidemia  HTN - con't zetia  10mg  daily - NPO to cannot receive  - hydralazine  prn for SBP > 180  DVT prophylaxis: heparin  injection 5,000 Units Start: 03/07/24 1400 SCDs Start: 02/29/24 1403   Code Status: Full Code  Family Communication:  None present at bedside.  Plan of care discussed with patient in length and he/she verbalized understanding and agreed with it.  Status is: Inpatient Remains inpatient appropriate because: Disposition per primary team.   Estimated body mass index is 30.35 kg/m as calculated from the following:   Height as of this encounter: 5' 4 (1.626 m).   Weight as of this encounter: 80.2 kg.  Nutritional Assessment: Body mass index is 30.35 kg/m.[name] Seen by dietician.  I agree with the assessment and plan as outlined below: Nutrition Status: Nutrition Problem: Severe Malnutrition Etiology: acute illness Signs/Symptoms: energy intake < or equal to 50% for > or equal to 5 days, mild muscle depletion, percent weight loss (10% in 1.5 months) Percent  weight loss: 10 % (in 1.5 months) Interventions: Refer to RD note for recommendations  . Skin Assessment: I have examined the patient's skin and I agree with the wound assessment as performed by the wound care RN as outlined below:    Consultants:  [facility]  Procedures:  As above  Antimicrobials:  Anti-infectives (From admission, onward)    Start     Dose/Rate Route Frequency Ordered Stop   03/01/24 1200  piperacillin -tazobactam (ZOSYN ) IVPB 3.375 g  Status:  Discontinued        3.375 g 12.5 mL/hr over 240 Minutes Intravenous Every 8 hours 03/01/24 0946 03/01/24 1116   03/01/24 1130  piperacillin -tazobactam (ZOSYN ) IVPB 3.375 g        3.375 g 12.5 mL/hr over 240 Minutes Intravenous Every 8 hours 03/01/24 1116           Subjective: Seen and examined.  Complains of abdominal pain.  She believes that her pain is not being addressed.  She also understands that GYN oncology is the primary service responsible for managing the pain.  She will discuss with them.  She had no other complaint for me.  Objective: Vitals:   03/08/24 0334 03/08/24 0400 03/08/24 0412 03/08/24 0700  BP:  134/66  130/64  Pulse:  84  83  Resp:  20  20  Temp: 98.5 F (36.9 C)     TempSrc: Axillary     SpO2:  97%  96%  Weight:   80.2 kg   Height:        Intake/Output Summary (Last 24 hours) at 03/08/2024 0750 Last data filed at 03/07/2024 2259 Gross per 24 hour  Intake 1440.59 ml  Output 1135 ml  Net 305.59 ml   Filed Weights   03/01/24 1823 03/06/24 0200 03/08/24 0412  Weight: 72.6 kg 79.1 kg 80.2 kg    Examination:  General exam: Appears calm and comfortable  Respiratory system: Clear to auscultation. Respiratory effort normal. Cardiovascular system: S1 & S2 heard, RRR. No JVD, murmurs, rubs, gallops or clicks. No pedal edema. Gastrointestinal system: Abdomen is nondistended, soft and generalized tenderness with wound VAC attached in colostomy bag. Central nervous system: Alert and oriented. No  focal neurological deficits. Extremities: Symmetric 5 x 5 power. Skin: No rashes, lesions or ulcers Psychiatry: Judgement and insight appear normal. Mood & affect appropriate.    Data Reviewed: I have personally reviewed following labs and imaging studies  CBC: Recent Labs  Lab 03/04/24 0413 03/05/24 0441 03/06/24 0447 03/07/24 1210 03/08/24 0353  WBC 6.7 16.3* 18.2* 11.8* 6.8  HGB 11.9* 11.7* 10.9* 11.5* 10.6*  HCT 35.4* 34.9* 32.5* 32.3* 30.9*  MCV 88.7 88.8 89.0 85.2 87.0  PLT 55* 55* 54* 62* 65*   Basic Metabolic Panel: Recent Labs  Lab 03/01/24 2005 03/02/24 0340 03/03/24 0406 03/04/24 0413 03/05/24 0441 03/06/24 0125 03/06/24 0447 03/07/24 0359 03/08/24 0353  NA 130* 128* 133*   < > 133* 136 135 135 132*  K 4.0 3.8 3.4*   < > 3.3* 3.6 3.4* 3.4* 3.3*  CL 99 103 102   < > 106 110 109 105 101  CO2 17* 17* 15*   < >  18* 17* 17* 22 24  GLUCOSE 151* 139* 84   < > 150* 88 124* 153* 138*  BUN 17 17 22    < > 17 13 13 16 21   CREATININE 1.58* 1.75* 2.07*   < > 1.32* 1.28* 1.19* 0.83 0.95  CALCIUM  8.3* 7.8* 7.8*   < > 7.3* 7.5* 7.1* 7.8* 7.5*  MG 0.8* 4.3* 2.2  --   --   --   --  1.6* 2.1  PHOS  --   --  3.6  --   --   --   --  1.2* 2.3*   < > = values in this interval not displayed.   GFR: Estimated Creatinine Clearance: 51.6 mL/min (by C-G formula based on SCr of 0.95 mg/dL). Liver Function Tests: Recent Labs  Lab 03/02/24 0340 03/04/24 0413 03/07/24 0359  AST 182* 106* 52*  ALT 158* 209* 76*  ALKPHOS 34* 65 123  BILITOT 1.4* 1.3* 1.6*  PROT 4.8* 4.5* 4.8*  ALBUMIN  2.8* 2.2* 2.1*   No results for input(s): LIPASE, AMYLASE in the last 168 hours. No results for input(s): AMMONIA in the last 168 hours. Coagulation Profile: No results for input(s): INR, PROTIME in the last 168 hours. Cardiac Enzymes: No results for input(s): CKTOTAL, CKMB, CKMBINDEX, TROPONINI in the last 168 hours. BNP (last 3 results) No results for input(s): PROBNP in  the last 8760 hours. HbA1C: No results for input(s): HGBA1C in the last 72 hours. CBG: Recent Labs  Lab 03/07/24 1704 03/07/24 1949 03/07/24 2309 03/08/24 0334 03/08/24 0732  GLUCAP 143* 126* 122* 145* 130*   Lipid Profile: No results for input(s): CHOL, HDL, LDLCALC, TRIG, CHOLHDL, LDLDIRECT in the last 72 hours. Thyroid  Function Tests: No results for input(s): TSH, T4TOTAL, FREET4, T3FREE, THYROIDAB in the last 72 hours. Anemia Panel: No results for input(s): VITAMINB12, FOLATE, FERRITIN, TIBC, IRON, RETICCTPCT in the last 72 hours. Sepsis Labs: Recent Labs  Lab 03/01/24 2006 03/02/24 0340  LATICACIDVEN 3.6* 3.1*    Recent Results (from the past 240 hours)  Urine Culture     Status: None   Collection Time: 03/01/24  5:57 PM   Specimen: Urine, Catheterized  Result Value Ref Range Status   Specimen Description   Final    URINE, CATHETERIZED Performed at [facility], [address], [place], [place] [address]    Special Requests   Final    NONE Performed at [facility], [address], [place], [place] [address]    Culture   Final    NO GROWTH Performed at [facility], [address] [place], [place] [address]    Report Status 03/02/2024 FINAL  Final  MRSA Next Gen by PCR, Nasal     Status: None   Collection Time: 03/02/24  9:49 AM   Specimen: Nasal Mucosa; Nasal Swab  Result Value Ref Range Status   MRSA by PCR Next Gen NOT DETECTED NOT DETECTED Final    Comment: (NOTE) The GeneXpert MRSA Assay (FDA approved for NASAL specimens only), is one component of a comprehensive MRSA colonization surveillance program. It is not intended to diagnose MRSA infection nor to guide or monitor treatment for MRSA infections. Test performance is not FDA approved in patients less than 36 years old. Performed at [facility], [address] [place], [place] [address]   Urine  Culture     Status: None   Collection Time: 03/06/24  4:37 PM   Specimen: Urine, Catheterized  Result Value Ref Range Status   Specimen Description   Final    URINE, CATHETERIZED Performed at [facility], [address], [place], [place] [address]    Special Requests   Final    NONE Performed at [facility], [address], [place], [place] [address]    Culture   Final    NO GROWTH Performed at [facility], [address], [place], [place] [address]    Report Status 03/07/2024 FINAL  Final     Radiology Studies: US  EKG SITE RITE Result Date: 03/06/2024 If Site Rite image not attached, placement could not be confirmed due to current cardiac rhythm.   Scheduled Meds:  buPROPion   150 mg Oral QPM   Chlorhexidine  Gluconate Cloth  6 each Topical Daily   ezetimibe   10 mg Per Tube Daily   gabapentin   300 mg Oral TID   heparin  injection (subcutaneous)  5,000 Units Subcutaneous Q8H   insulin  aspart  0-15 Units Subcutaneous Q4H   levothyroxine   75 mcg Per Tube QAC breakfast   lidocaine   1 patch Transdermal Q24H   melatonin  3 mg Oral QHS   pantoprazole  (PROTONIX ) IV  40 mg Intravenous Q24H   polyethylene glycol  17 g Oral Daily   potassium chloride   40 mEq Oral Q4H   sodium chloride  flush  10-40 mL Intracatheter Q12H   thiamine  (VITAMIN B1) injection  100 mg Intravenous Q24H   Continuous Infusions:  piperacillin -tazobactam (ZOSYN )  IV 3.375 g (03/08/24 0352)   potassium PHOSPHATE  IVPB (in mmol)     TPN ADULT (ION) 40 mL/hr at 03/07/24 2259     LOS: 8 days   Fredia Skeeter, MD [facility]  03/08/2024, 7:50 AM   *Please note that this is a verbal dictation therefore any spelling or grammatical errors are due to the Dragon Medical One system interpretation.  Please page via Amion and do not message via secure chat for urgent patient care matters. Secure chat can be used for non urgent patient care matters.  How to contact  the [facility] Attending or Consulting provider 7A - 7P or covering provider during after hours 7P -7A, for this patient?  Check the care team in [facility] and look for a) attending/consulting [facility] provider listed and b) the [facility] team listed. Page or secure chat [phone]. Log into [email or web address] and use 's universal password to access. If you do not have the password, please contact the hospital operator. Locate the [facility] provider you are looking for under [facility] and page to a number that you can be directly reached. If you still have difficulty reaching the provider, please page the [facility] (Director on Call) for the Hospitalists listed on amion for assistance.

## 2024-03-08 NOTE — Plan of Care (Signed)
  Problem: Clinical Measurements: Goal: Ability to maintain clinical measurements within normal limits will improve Outcome: Progressing   Problem: Coping: Goal: Level of anxiety will decrease Outcome: Progressing   Problem: Pain Managment: Goal: General experience of comfort will improve and/or be controlled Outcome: Progressing   Problem: Safety: Goal: Ability to remain free from injury will improve Outcome: Progressing   Problem: Metabolic: Goal: Ability to maintain appropriate glucose levels will improve Outcome: Progressing

## 2024-03-08 NOTE — Progress Notes (Addendum)
 GYN Oncology Progress Note  7 Days Post-Op Procedure(s) (LRB): LAPAROTOMY, EXPLORATORY (N/A) SIGMOIDOSCOPY COLECTOMY, WITH COLOSTOMY CREATION MOBILIZATION, SPLENIC FLEXURE, WITH PARTIAL COLECTOMY CYSTOSCOPY  Above procedure performed after recent D&C procedure on 02/29/2024 for thickened endometrium. D&C procedure complicated by bowel perforation. Patient taken back to the OR on 03/01/24 after developing signs of sepsis,shock (hypotension, tachycardia, peritonitis symptoms). She was started on zosyn  on 03/01/2024 and this has been continued. She was admitted to ICU post-op on ventilation. Now extubated. TPN started 03/06/24.  Subjective: Patient moaning in pain. Reports having severe abdominal pain. No nausea reported. Asking for pain medication but just received IV medication. Reporting feeling of shortness of breath (currently 97% on RA).  Objective: Vital signs in last 24 hours: Temp:  [97.3 F (36.3 C)-98.7 F (37.1 C)] 98.5 F (36.9 C) (07/09 0334) Pulse Rate:  [81-106] 83 (07/09 0700) Resp:  [16-31] 20 (07/09 0700) BP: (92-208)/(54-104) 130/64 (07/09 0700) SpO2:  [96 %-100 %] 96 % (07/09 0700) Weight:  [176 lb 12.9 oz (80.2 kg)] 176 lb 12.9 oz (80.2 kg) (07/09 0412) Last BM Date : 03/07/24  Intake/Output from previous day: 07/08 0701 - 07/09 0700 In: 1820.1 [I.V.:1086.9; IV Piggyback:733.3] Out: 1135 [Urine:1100; Drains:35]  Physical Examination: General: Moaning intermittently, reporting severe pain. Opens eyes and answers questions. Oriented to place and person. Resp: Normal work of breathing. Lungs with some mild scattered rhonchi Cardio: Sinus rhythm with irregular beat intermittently. 87-88 bpm. GI: incision: midline abdominal incision with wound VAC dressing in place connected to suction and abdomen soft, non-distended, moderately tender, hypoactive bowel sounds, JP drain with serosanguinous drainage, ostomy with small amount of serous output with no flatus, darkening around  the superior edges of stoma remains with central mucosa pink.  Extremities: extremities normal, atraumatic, SCDs in place, edema in bilateral hands Purewick in place with clear yellow urine. Rectal pouch with liquid brown stool.  Labs: WBC/Hgb/Hct/Plts:  6.8/10.6/30.9/65 (07/09 9646) BUN/Cr/glu/ALT/AST/amyl/lip:  21/0.95/--/--/--/--/-- (07/09 0353)  Assessment: 78 y.o. s/p Procedure(s): LAPAROTOMY, EXPLORATORY, SIGMOIDOSCOPY, COLECTOMY, WITH COLOSTOMY CREATION, MOBILIZATION, SPLENIC FLEXURE, WITH PARTIAL COLECTOMY, CYSTOSCOPY: stable  CXR and EKG performed 03/05/24 AM due to report of difficulty breathing, chest pressure yesterday; currently on room air. Given increasing WBC count with concern for infection with delirium, CT CAP along with CT head performed 03/05/2024 as well: CT CAP: 1. Colonic diverticulosis with evidence of acute diverticulitis in the sigmoid region. 2. Postoperative diverting left lower quadrant descending colostomy. Surgical drain in the pelvis. 3. Small amount of free fluid in the pelvis is likely reactive or postoperative. 4. 4.6 cm diameter air-fluid level in the anterior pelvis in or adjacent to the sigmoid colon. This may represent an abscess or an air-fluid level within a dilated segment of colon. Repeat imaging with rectal contrast material may help to distinguish. 5. Bilateral pleural effusions with basilar atelectasis or infiltration. This may represent compressive atelectasis or pneumonia. 6. Aortic atherosclerosis. 7. Anterior abdominal wall surgical wound. Diffuse subcutaneous edema.  CT Head: 1. Atrophy and chronic small vessel ischemic changes of the white matter. 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 2. Negative for hemorrhage, mass or large vessel territorial infarct.  Pain:  Continues to report moderate to severe pain. Oxycodone  dosing increased yesterday and home med gabapentin  reordered. Has oral for use  initially, then IV for pain not relieved with oral.  Heme: Hgb 10.6 and Hct 11.5 this am. PLT 65. Continue to monitor.  Status post 2 units in the operating  room.  ID: WBC count 6.8 this am. Afebrile. Currently on zosyn  for bowel perforation from recent D&C. S/P exploratory laparotomy with colectomy and ostomy creation. Per discussion with IR, air filled collection felt to be related to be intraluminal in distal rectosigmoid stump with inflammation in colon from postop setting and diverticulitis. No indication for drain placement.   CV: On monitoring. BP and HR overall stable at this time. Mild tachycardia improving.   GI:  Diet: NPO. Started on TPN 03/06/24. Has small amount of serous output in ostomy, no gas. Stoma with pink mucosa centrally.   GU: Creatinine 0.95.  Improving.  On TPN while NPO. Purewick in place. Adequate output reported.   FEN: Am metabolic panel reviewed. Repletion as needed per pharmacy.    Endo: Diabetes. CBG (last 3)  Recent Labs    03/07/24 2309 03/08/24 0334 03/08/24 0732  GLUCAP 122* 145* 130*     Prophylaxis: SCDs, heparin  held at this time due to thrombocytopenia.  Plan: Continue with current plan of care: TPN, awaiting return of bowel function, zosyn . Adjusting pain regimen with IV dilaudid  changed to Q2H PRN Gabapentin  reordered per home regimen (300 mg TID) yesterday. Adjusting dose to see if this improves pain level.  Physical therapy evaluation appreciated.  Continue to monitor I&Os with purewick. Continue NPO except sips with meds until gas in ostomy.  Appreciate WO evaluation, management. Continue daily labs.   LOS: 8 days    Alexandria Durham 03/08/2024, 8:09 AM

## 2024-03-08 NOTE — Progress Notes (Signed)
 PHARMACY - TOTAL PARENTERAL NUTRITION CONSULT NOTE   Indication: Prolonged ileus  Patient Measurements: Height: 5' 4 (162.6 cm) Weight: 80.2 kg (176 lb 12.9 oz) IBW/kg (Calculated) : 54.7 TPN AdjBW (KG): 60.8 Body mass index is 30.35 kg/m.  Assessment: 78 year old female s/p hysterectomy with D&C on 7/1. Concern for postoperative bowel perforation. Pt back to OR on 7/2 for ex lap with colectomy and ostomy creation. Pharmacy consulted for TPN.  Glucose / Insulin : pt on Januvia per PTA med list -CBGs 122-145 -10u SSI in past 24 hours  Electrolytes:  -K remains low at 3.3, phos is now up to 2.3, Mg now WNL at 2.3, Na down to 132  -CorrCa WNL Renal: Scr improved Hepatic: LFTs slightly elevated; alb 2.1 Intake / Output; MIVF:  -UOP adequate -Drain 35  ml last 24 hr -Stool not charted on 7/8 GI Imaging: -7/6 CT A/P 4.6 cm diameter air-fluid level in the anterior pelvis in or adjacent to the sigmoid colon; this may represent an abscess or an air-fluid level within a dilated segment of colon GI Surgeries / Procedures:  -7/2 ex lap, colectomy w/ colostomy -7/1 hysterectomy  Central access: PICC TPN start date: 7/7  Nutritional Goals: Goal TPN rate is 75 mL/hr (provides 104 g of protein and 1778 kcals per day)  RD Assessment: Estimated Needs Total Energy Estimated Needs: 1750-1900 kcals Total Protein Estimated Needs: 90-110 grams Total Fluid Estimated Needs: >/= 1.8L  Current Nutrition: NPO  Plan:  -Potassium phos 30 mmol IV x 1 + KCl 40 meq PO x 2 packets    -Continue TPN at 40 mL/hr - defer advancing TPN today given drop in electrolytes concerning for refeeding -Electrolytes in TPN:  Na 70 mEq/L  K 70 mEq/L Ca 5 mEq/L  Mg 5 mEq/L  Phos 20 mmol/L.  Cl:Ac 1:1 -Add standard MVI and trace elements to TPN - consider changing to PO once oral intake more reliable -Continue moderate SSI q4h -Thiamine  x 6 days for refeeding risk -Monitor TPN labs on Mon/Thurs, daily while  monitoring for refeeding   Dolphus Roller, PharmD, BCPS 03/08/2024 11:13 AM

## 2024-03-08 NOTE — Progress Notes (Signed)
   Inpatient Rehabilitation Admissions Coordinator   I will place rehab consult order to assess candidacy for possible CIR admit.  Heron Leavell, RN, MSN Rehab Admissions Coordinator [phone] 03/08/2024 4:21 PM

## 2024-03-09 ENCOUNTER — Inpatient Hospital Stay (HOSPITAL_COMMUNITY)

## 2024-03-09 ENCOUNTER — Telehealth: Payer: Self-pay | Admitting: Oncology

## 2024-03-09 DIAGNOSIS — N950 Postmenopausal bleeding: Secondary | ICD-10-CM | POA: Diagnosis not present

## 2024-03-09 DIAGNOSIS — I509 Heart failure, unspecified: Secondary | ICD-10-CM

## 2024-03-09 LAB — MAGNESIUM: Magnesium: 1.8 mg/dL (ref 1.7–2.4)

## 2024-03-09 LAB — ECHOCARDIOGRAM COMPLETE
Area-P 1/2: 4 cm2
Calc EF: 69.4 %
Height: 64 in
S' Lateral: 2.1 cm
Single Plane A2C EF: 71.6 %
Single Plane A4C EF: 64.3 %
Weight: 2828.94 [oz_av]

## 2024-03-09 LAB — COMPREHENSIVE METABOLIC PANEL WITH GFR
ALT: 54 U/L — ABNORMAL HIGH (ref 0–44)
AST: 59 U/L — ABNORMAL HIGH (ref 15–41)
Albumin: 1.9 g/dL — ABNORMAL LOW (ref 3.5–5.0)
Alkaline Phosphatase: 99 U/L (ref 38–126)
Anion gap: 7 (ref 5–15)
BUN: 19 mg/dL (ref 8–23)
CO2: 22 mmol/L (ref 22–32)
Calcium: 7.2 mg/dL — ABNORMAL LOW (ref 8.9–10.3)
Chloride: 98 mmol/L (ref 98–111)
Creatinine, Ser: 0.96 mg/dL (ref 0.44–1.00)
GFR, Estimated: >60 mL/min (ref >60)
Glucose, Bld: 111 mg/dL — ABNORMAL HIGH (ref 70–99)
Potassium: 4.6 mmol/L (ref 3.5–5.1)
Sodium: 127 mmol/L — ABNORMAL LOW (ref 135–145)
Total Bilirubin: 1.2 mg/dL (ref 0.0–1.2)
Total Protein: 4.8 g/dL — ABNORMAL LOW (ref 6.5–8.1)

## 2024-03-09 LAB — CBC
HCT: 27.7 % — ABNORMAL LOW (ref 36.0–46.0)
Hemoglobin: 9.7 g/dL — ABNORMAL LOW (ref 12.0–15.0)
MCH: 30.8 pg (ref 26.0–34.0)
MCHC: 35 g/dL (ref 30.0–36.0)
MCV: 87.9 fL (ref 80.0–100.0)
Platelets: 86 K/uL — ABNORMAL LOW (ref 150–400)
RBC: 3.15 MIL/uL — ABNORMAL LOW (ref 3.87–5.11)
RDW: 17.2 % — ABNORMAL HIGH (ref 11.5–15.5)
WBC: 5.6 K/uL (ref 4.0–10.5)
nRBC: 0 % (ref 0.0–0.2)

## 2024-03-09 LAB — GLUCOSE, CAPILLARY
Glucose-Capillary: 141 mg/dL — ABNORMAL HIGH (ref 70–99)
Glucose-Capillary: 126 mg/dL — ABNORMAL HIGH (ref 70–99)
Glucose-Capillary: 152 mg/dL — ABNORMAL HIGH (ref 70–99)
Glucose-Capillary: 139 mg/dL — ABNORMAL HIGH (ref 70–99)
Glucose-Capillary: 169 mg/dL — ABNORMAL HIGH (ref 70–99)

## 2024-03-09 LAB — PHOSPHORUS: Phosphorus: 3.5 mg/dL (ref 2.5–4.6)

## 2024-03-09 LAB — BRAIN NATRIURETIC PEPTIDE: B Natriuretic Peptide: 131.5 pg/mL — ABNORMAL HIGH (ref 0.0–100.0)

## 2024-03-09 MED ORDER — SIMETHICONE 40 MG/0.6ML PO SUSP
80.0000 mg | Freq: Four times a day (QID) | ORAL | Status: DC | PRN
  Administered 2024-03-31 – 2024-04-01 (×3): 80 mg via ORAL
  Filled 2024-03-09 (×4): qty 1.2

## 2024-03-09 MED ORDER — LEVOTHYROXINE SODIUM 75 MCG PO TABS
75.0000 ug | ORAL_TABLET | Freq: Every day | ORAL | Status: DC
  Administered 2024-03-10 – 2024-04-04 (×26): 75 ug via ORAL
  Filled 2024-03-09 (×10): qty 1
  Filled 2024-03-09: qty 3
  Filled 2024-03-09 (×15): qty 1

## 2024-03-09 MED ORDER — ONDANSETRON HCL 4 MG PO TABS
4.0000 mg | ORAL_TABLET | Freq: Four times a day (QID) | ORAL | Status: DC | PRN
  Administered 2024-03-11 – 2024-04-01 (×2): 4 mg via ORAL
  Filled 2024-03-09 (×2): qty 1

## 2024-03-09 MED ORDER — ADULT MULTIVITAMIN W/MINERALS CH
1.0000 | ORAL_TABLET | Freq: Every day | ORAL | Status: DC
  Administered 2024-03-09 – 2024-04-04 (×26): 1 via ORAL
  Filled 2024-03-09 (×27): qty 1

## 2024-03-09 MED ORDER — PERFLUTREN LIPID MICROSPHERE
1.0000 mL | INTRAVENOUS | Status: AC | PRN
  Administered 2024-03-09: 5 mL via INTRAVENOUS

## 2024-03-09 MED ORDER — BOOST / RESOURCE BREEZE PO LIQD CUSTOM
1.0000 | Freq: Three times a day (TID) | ORAL | Status: DC
  Administered 2024-03-10: 237 mL via ORAL
  Administered 2024-03-10 – 2024-03-12 (×4): 1 via ORAL
  Administered 2024-03-13: 237 mL via ORAL

## 2024-03-09 MED ORDER — TRAVASOL 10 % IV SOLN
INTRAVENOUS | Status: AC
  Filled 2024-03-09: qty 1044

## 2024-03-09 MED ORDER — ONDANSETRON HCL 4 MG/2ML IJ SOLN
4.0000 mg | Freq: Four times a day (QID) | INTRAMUSCULAR | Status: DC | PRN
  Administered 2024-03-25: 4 mg via INTRAVENOUS
  Filled 2024-03-09 (×2): qty 2

## 2024-03-09 MED ORDER — ALPRAZOLAM 0.25 MG PO TABS
0.2500 mg | ORAL_TABLET | Freq: Two times a day (BID) | ORAL | Status: DC | PRN
  Administered 2024-03-09 – 2024-03-10 (×3): 0.25 mg via ORAL
  Filled 2024-03-09 (×3): qty 1

## 2024-03-09 MED ORDER — FUROSEMIDE 10 MG/ML IJ SOLN
40.0000 mg | Freq: Once | INTRAMUSCULAR | Status: AC
  Administered 2024-03-09: 40 mg via INTRAVENOUS
  Filled 2024-03-09: qty 4

## 2024-03-09 MED ORDER — EZETIMIBE 10 MG PO TABS
10.0000 mg | ORAL_TABLET | Freq: Every day | ORAL | Status: DC
  Administered 2024-03-10 – 2024-04-04 (×26): 10 mg via ORAL
  Filled 2024-03-09 (×26): qty 1

## 2024-03-09 NOTE — Progress Notes (Addendum)
 GYN Oncology Progress Note  8 Days Post-Op Procedure(s) (LRB): LAPAROTOMY, EXPLORATORY (N/A) SIGMOIDOSCOPY COLECTOMY, WITH COLOSTOMY CREATION MOBILIZATION, SPLENIC FLEXURE, WITH PARTIAL COLECTOMY CYSTOSCOPY  Above procedure performed after recent D&C procedure on 02/29/2024 for thickened endometrium. D&C procedure complicated by bowel perforation. Patient taken back to the OR on 03/01/24 after developing signs of sepsis,shock (hypotension, tachycardia, peritonitis symptoms). She was started on zosyn  on 03/01/2024 and this has been continued. She was admitted to ICU post-op on ventilation. Now extubated. TPN started 03/06/24. Ostomy with stool this am. Patient more alert. Plan for transfer to the floor, clear liquid diet.   Subjective: Patient currently having wound vac dressing change and ostomy change. Reporting moderate pain with this. Was having shortness of breath earlier per RN but this has resolved per pt. O2 sat was normal during this time. Denies chest pain. No nausea.   Objective: Vital signs in last 24 hours: Temp:  [97.3 F (36.3 C)-98.3 F (36.8 C)] 98.3 F (36.8 C) (07/10 0314) Pulse Rate:  [85-93] 86 (07/10 0700) Resp:  [15-32] 15 (07/10 0700) BP: (118-178)/(51-124) 138/51 (07/10 0700) SpO2:  [94 %-100 %] 95 % (07/10 0700) Weight:  [176 lb 12.9 oz (80.2 kg)] 176 lb 12.9 oz (80.2 kg) (07/10 0500) Last BM Date : 03/07/24  Intake/Output from previous day: 07/09 0701 - 07/10 0700 In: 1469.7 [I.V.:1271.8; IV Piggyback:197.9] Out: 975 [Urine:800; Drains:125; Stool:50]  Physical Examination: General: Currently having dressing/ostomy change, moaning related to pain.  Resp: Normal work of breathing. Lungs clear. Cardio: Regular sinus rhythm and rate. GI: incision: midline abdominal incision open in between dressing changes, see media for photo and abdomen soft, non-distended, moderately tender, hypoactive bowel sounds, JP drain with serosanguinous drainage-cloudy brown drainage noted  from the insertion site (culture taken) ostomy with liquid stool, darkening around the superior edges of stoma improving. See media for updated photo from today.  Extremities: extremities normal, atraumatic, SCDs in place, edema in bilateral hands Purewick in place with clear yellow urine.  Labs: WBC/Hgb/Hct/Plts:  5.6/9.7/27.7/86 (07/10 0535) BUN/Cr/glu/ALT/AST/amyl/lip:  19/0.96/--/54/59/--/-- (07/10 0535)  Assessment: 78 y.o. s/p Procedure(s): LAPAROTOMY, EXPLORATORY, SIGMOIDOSCOPY, COLECTOMY, WITH COLOSTOMY CREATION, MOBILIZATION, SPLENIC FLEXURE, WITH PARTIAL COLECTOMY, CYSTOSCOPY: stable  CXR and EKG performed 03/05/24 AM due to report of difficulty breathing, chest pressure yesterday; currently on room air. Given increasing WBC count with concern for infection with delirium, CT CAP along with CT head performed 03/05/2024 as well: CT CAP: 1. Colonic diverticulosis with evidence of acute diverticulitis in the sigmoid region. 2. Postoperative diverting left lower quadrant descending colostomy. Surgical drain in the pelvis. 3. Small amount of free fluid in the pelvis is likely reactive or postoperative. 4. 4.6 cm diameter air-fluid level in the anterior pelvis in or adjacent to the sigmoid colon. This may represent an abscess or an air-fluid level within a dilated segment of colon. Repeat imaging with rectal contrast material may help to distinguish. 5. Bilateral pleural effusions with basilar atelectasis or infiltration. This may represent compressive atelectasis or pneumonia. 6. Aortic atherosclerosis. 7. Anterior abdominal wall surgical wound. Diffuse subcutaneous edema.  CT Head: 1. Atrophy and chronic small vessel ischemic changes of the white matter. 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 2. Negative for hemorrhage, mass or large vessel territorial infarct.  Pain:  Continues to report moderate to severe pain intermittently (more during dressing  change this am)-adjustments made to regimen yesterday.  Heme: Hgb 9.7 and Hct 27.7 this am. PLT 86. Continue to monitor.  Status  post 2 units in the operating room.  ID: WBC count 5.6 this am. Afebrile. Currently on zosyn  for bowel perforation from recent D&C. S/P exploratory laparotomy with colectomy and ostomy creation. Per discussion with IR, air filled collection felt to be related to be intraluminal in distal rectosigmoid stump with inflammation in colon from postop setting and diverticulitis. No indication for drain placement.  Culture taken today from drainage at JP site. Per Dr. [name], once tolerating clears and if afebrile can consider transitioning to oral (Augmentin)-this can be adjusted based on culture results if needed.  CV: On monitoring. BP and HR overall stable at this time. Mild tachycardia improved.   GI:  Diet: NPO. Started on TPN 03/06/24. Having ostomy output this am.   GU: Creatinine 0.96.  Improving.  On TPN while NPO. Purewick in place. Adequate output reported.   FEN: Am metabolic panel reviewed. Repletion as needed per pharmacy.    Endo: Diabetes. CBG (last 3)  Recent Labs    03/08/24 2339 03/09/24 0312 03/09/24 0737  GLUCAP 141* 141* 126*     Prophylaxis: SCDs, heparin   Plan: Transfer to the surgical med surg floor. Continue with current plan of care: TPN, zosyn . Diet ordered for clear liquids. Once clear liquid diet tolerated and if remains afebrile, can consider transition to oral antibiotics Xanax  ordered for anxiety. Could consider palliative consult for assistance with pain management. Physical therapy evaluation appreciated.  Continue to monitor I&Os with purewick. Appreciate WO evaluation, management. Continue daily labs. Rehab consult placed by coordinator to assess for possible CIR admit.   LOS: 9 days    Eleanor JONETTA Epps 03/09/2024, 7:39 AM

## 2024-03-09 NOTE — Progress Notes (Signed)
 PHARMACY - TOTAL PARENTERAL NUTRITION CONSULT NOTE   Indication: Prolonged ileus  Patient Measurements: Height: 5' 4 (162.6 cm) Weight: 80.2 kg (176 lb 12.9 oz) IBW/kg (Calculated) : 54.7 TPN AdjBW (KG): 60.8 Body mass index is 30.35 kg/m.  Assessment: 78 year old female s/p hysterectomy with D&C on 7/1. Concern for postoperative bowel perforation. Pt back to OR on 7/2 for ex lap with colectomy and ostomy creation. Pharmacy consulted for TPN.  Glucose / Insulin : pt on Januvia per PTA med list -CBGs within goal < 150 -14u SSI in 24 hrs Electrolytes:  -Na 127 - down -K, phos, Mg improved -CorrCa low end of normal Renal: Scr stable Hepatic: LFTs slightly elevated; alb 1.9 Intake / Output; MIVF:  -UOP adequate -Drain ~ 100 ml overnight -Appears to be having some stool GI Imaging: -7/6 CT A/P 4.6 cm diameter air-fluid level in the anterior pelvis in or adjacent to the sigmoid colon; this may represent an abscess or an air-fluid level within a dilated segment of colon GI Surgeries / Procedures:  -7/2 ex lap, colectomy w/ colostomy -7/1 hysterectomy  Central access: PICC TPN start date: 7/7  Nutritional Goals: Goal TPN rate is 75 mL/hr (provides 104 g of protein and 1778 kcals per day)  RD Assessment: Estimated Needs Total Energy Estimated Needs: 1750-1900 kcals Total Protein Estimated Needs: 90-110 grams Total Fluid Estimated Needs: >/= 1.8L  Current Nutrition:  CLD starting 7/10  Plan:  -Increase TPN to goal 75 mL/hr at 1800 -Electrolytes in TPN: Na 70 mEq/L, K 50 mEq/L, Ca 5 mEq/L, Mg 5 mEq/L, and Phos 20 mmol/L. Cl:Ac 1:1 -Follow for diet advancement as tolerated - can likely stop TPN next 24-48 hrs assuming pt tolerates advancement and adequately meeting goals -Change multivitamin to PO -Continue moderate SSI q4h -Thiamine  x 6 days for refeeding risk -Monitor TPN labs on Mon/Thurs, daily while monitoring for refeeding and advancing to goal  Stefano MARLA Bologna,  PharmD, BCPS Clinical Pharmacist 03/09/2024 8:38 AM

## 2024-03-09 NOTE — Progress Notes (Addendum)
 Inpatient Rehab Admissions Coordinator:    CIR consult received. Pt. Is not oriented enough for discussion of CIR. I have attempted to reach out to her daughter and granddaughter to discuss. I await callbacks.    Addendum: Spoke with daughter, She states Pt. Will not have 24/7 assist after CIR. She is interested in SNF placement.CIR will sign off.    Leita Kleine, MS, CCC-SLP Rehab Admissions Coordinator  [phone] (celll) [phone] (office)

## 2024-03-09 NOTE — Plan of Care (Signed)

## 2024-03-09 NOTE — Progress Notes (Signed)
 Patient is a 78 yr old female with DM, CKD3A , Level I obesity, and HTN as well as recent pancreatitis and acute cholecystitis s/p removal and D&C in 5/25 Admitted 7/1 for another D&C for thickened endometrium. Was found on 03/01/24 to have sepsis/shock and found to have bowel and uterine perforation and placed on Zosyn  STAT.  She underwent ex-lap on 7/2 with Partial colectomy and colostomy.  As wlel as JP drain that was cultured today.   On 7/6- had CXR/EKG due to chest pressure and SOB- has B/L pleural effusions- a CT of chest, abdomen and pelvis due to increased WBC- had 4.6 cm with possible air fluid level on dilated segment of colon. Has abd wound and VAC on abd as well.    Was NPO until this AM and on TPN. Now on clear fluids with thin diet as of 7/10; and given Lasix  40 mg IV x1 due to pleural effusions and crackles- ordering ECHO.  Na is 127- Ca 7.2 and Albumin  1.9- did also have with AKI on CKD3a- max Cr was 2.07. Was given Xanax  for anxiety and palliative care for pain.   Therapy- wise- pt is mod-max of 2 to complete balance, cognition and mobility/ADLs;   Social Hx:  Was Independent with SPC including driving and going to church- daughter lives locally  Plan: Pt so far is pretty low level- I think, since just getting out of ICU today, she can improve over the next few days- we will need to re-eval her level of function prior to trying to admit to CIR- she also has a a daughter that lives locally, but not clear if she can stay with pt 24/7- which she will definitively need when she goes home, and will probably also need PHYSICAL assistance at d/c from the hospital. Depending on how she progresses, she will likely need 2-3 weeks in CIR IF she has good dispo AND has started to walk 10+ ft with PT AND is at worst Max A of 1  for BADLs in the next few days- which I think is doable.  Will have admissions coordinator follow her for functional improvement.

## 2024-03-09 NOTE — Progress Notes (Signed)
 PROGRESS NOTE    Alexandria Durham  FMW:[id number] DOB: 1946/01/07 DOA: 02/29/2024 PCP: [name], NP   Brief Narrative:  Alexandria Durham is a 78 y.o.  F with PMH significant for thickened endometrium, HTN, Type 2 DM, CKD stage IIIa, recent pancreatitis and acute cholecystitis s/p lap chole and D&C in May of this year who was admitted 7/1 for hysteroscopy and repeat D&C.  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 Pt was acidotic post-procedure, so decision made to leave intubated, PCCM consulted in this setting.  Significant events as below.  Patient was eventually transferred under [facility] to comanage on 03/07/2024 while GYN oncology remains the primary service.  Significant Hospital Events: Including procedures, antibiotic start and stop dates in addition to other pertinent events   7/2 admit to ICU post-op, ventilated  7/3 pt remains on low dose neo, weaned off sedation but mental status still precludes SBT 7/4 extubated and weaned off pressors 7/6: chest pain yesterday with flat trops, non-ischemic EKG. CT CAP with diverticulitis of sigmoid. Had CT head which was negative.  7/8-patient transferred under [facility] for continuation of the comanagement.    Assessment & Plan:   Principal Problem:   Postmenopausal bleeding Active Problems:   Protein-calorie malnutrition, severe  S/p D&C procedure complicated by bowel perforation S/p partial colectomy and ileostomy  Septic shock, shock resolved  Presented for d&c on 7/1 c/b bowel perforation. OR 7/2 after development of septic shock, zosyn  started. Had ex lap with sigmoidoscopy, partial collection and ileostomy placement. Now off pressors. CT AP 7/6 showing sigmoid diverticulitis, air-fluid level 4.6cm unable to discern abscess vs segment of colon.  - ongoing leukocytosis up to 18.2 on 03/06/2024 while patient  continued to be on Zosyn , white cells have improved to 11.8 today.  Primary service was going to talk to IR for possible abscess drainage.  PICC line was placed and patient started on TPN.  Management per primary service.   Chest pain  7/6 complained of chest pressure. Trop x2 flat @ 22. EKG non ischemic. CXR with no acute findings.  Monitor on telemetry.   Hypokalemia: replenished/resolved.  AKI on CKD stage IIIa: Baseline creatinine around 1.2.  Creatinine peaked to 2.07 which has improved better than baseline.    Acute Thrombocytopenia  No signs of active bleeding and hemoglobin is stable. Likely result of critical illness.  - trend and transfuse prn  - no heparin  for now    Type 2 DM Hypoglycemia: Episodes of hypoglycemia. Was started on D5LR which has been discontinued.  Patient's blood sugar is now controlled.   Hypothyroidism  - con't synthroid  75mcg daily - NPO so cannot receive   Hyperlipidemia  HTN - con't zetia  10mg  daily - NPO to cannot receive  - hydralazine  prn for SBP > 180  Mild hyponatremia: 127 today.  Repeat labs in the morning.  She is asymptomatic.  Respiratory insufficiency: When I saw her today, she was on 2 L of oxygen.  On examination, she had crackles on the exam.  I ordered stat Lasix  IV 40 mg x 1.  Repeated chest x-ray right away which shows atelectasis, and checked BNP which is slightly elevated.  Strangely, chest x-ray does not show any vascular congestion but atelectasis.  Checking echo.  Per nurse, she was placed on oxygen for comfort only since she was in pain.  She was never hypoxic.  Oxygen was removed once her pain was controlled.  DVT prophylaxis: heparin  injection 5,000 Units Start: 03/07/24 1400 SCDs Start: 02/29/24 1403   Code Status: Full Code  Family Communication:  None present at bedside.  Plan of care discussed with patient in length and he/she verbalized understanding and agreed with it.  Status is: Inpatient Remains inpatient  appropriate because: Disposition per primary team.   Estimated body mass index is 30.35 kg/m as calculated from the following:   Height as of this encounter: 5' 4 (1.626 m).   Weight as of this encounter: 80.2 kg.    Nutritional Assessment: Body mass index is 30.35 kg/m.Alexandria Durham Seen by dietician.  I agree with the assessment and plan as outlined below: Nutrition Status: Nutrition Problem: Severe Malnutrition Etiology: acute illness Signs/Symptoms: energy intake < or equal to 50% for > or equal to 5 days, mild muscle depletion, percent weight loss (10% in 1.5 months) Percent weight loss: 10 % (in 1.5 months) Interventions: Refer to RD note for recommendations  . Skin Assessment: I have examined the patient's skin and I agree with the wound assessment as performed by the wound care RN as outlined below:    Consultants:  [facility]  Procedures:  As above  Antimicrobials:  Anti-infectives (From admission, onward)    Start     Dose/Rate Route Frequency Ordered Stop   03/01/24 1200  piperacillin -tazobactam (ZOSYN ) IVPB 3.375 g  Status:  Discontinued        3.375 g 12.5 mL/hr over 240 Minutes Intravenous Every 8 hours 03/01/24 0946 03/01/24 1116   03/01/24 1130  piperacillin -tazobactam (ZOSYN ) IVPB 3.375 g        3.375 g 12.5 mL/hr over 240 Minutes Intravenous Every 8 hours 03/01/24 1116           Subjective: Seen and examined.  Slightly sleepy this morning.  Was complaining of abdominal pain which she has been complaining for few days.  Is also complaining of some shortness of breath.  No other complaint.  Objective: Vitals:   03/09/24 0400 03/09/24 0500 03/09/24 0600 03/09/24 0700  BP: 125/64 (!) 132/59 (!) 119/57 (!) 138/51  Pulse: 91 91 89 86  Resp: (!) 25 20 19 15   Temp:      TempSrc:      SpO2: 95% 94% 94% 95%  Weight:  80.2 kg    Height:        Intake/Output Summary (Last 24 hours) at 03/09/2024 0754 Last data filed at 03/09/2024 0659 Gross per 24 hour  Intake  1469.66 ml  Output 975 ml  Net 494.66 ml   Filed Weights   03/06/24 0200 03/08/24 0412 03/09/24 0500  Weight: 79.1 kg 80.2 kg 80.2 kg    Examination:  General exam: Appears in pain. Respiratory system: Bibasilar crackles.  Respiratory effort normal. Cardiovascular system: S1 & S2 heard, RRR. No JVD, murmurs, rubs, gallops or clicks. No pedal edema.  However she has +1 pitting edema bilateral upper extremities. Gastrointestinal system: Abdomen is nondistended, soft and nontender. No organomegaly or masses felt. Normal bowel sounds heard. Central nervous system: Lethargic but oriented. No focal neurological deficits. Extremities: Symmetric 5 x 5 power. Skin: No rashes, lesions or ulcers.   Data Reviewed: I have personally reviewed following labs and imaging studies  CBC: Recent Labs  Lab 03/05/24 0441 03/06/24 0447 03/07/24 1210 03/08/24 0353 03/09/24 0535  WBC 16.3* 18.2* 11.8* 6.8 5.6  HGB 11.7* 10.9* 11.5* 10.6* 9.7*  HCT 34.9* 32.5*  32.3* 30.9* 27.7*  MCV 88.8 89.0 85.2 87.0 87.9  PLT 55* 54* 62* 65* 86*   Basic Metabolic Panel: Recent Labs  Lab 03/03/24 0406 03/04/24 0413 03/06/24 0125 03/06/24 0447 03/07/24 0359 03/08/24 0353 03/09/24 0535  NA 133*   < > 136 135 135 132* 127*  K 3.4*   < > 3.6 3.4* 3.4* 3.3* 4.6  CL 102   < > 110 109 105 101 98  CO2 15*   < > 17* 17* 22 24 22   GLUCOSE 84   < > 88 124* 153* 138* 111*  BUN 22   < > 13 13 16 21 19   CREATININE 2.07*   < > 1.28* 1.19* 0.83 0.95 0.96  CALCIUM  7.8*   < > 7.5* 7.1* 7.8* 7.5* 7.2*  MG 2.2  --   --   --  1.6* 2.1 1.8  PHOS 3.6  --   --   --  1.2* 2.3* 3.5   < > = values in this interval not displayed.   GFR: Estimated Creatinine Clearance: 51.1 mL/min (by C-G formula based on SCr of 0.96 mg/dL). Liver Function Tests: Recent Labs  Lab 03/04/24 0413 03/07/24 0359 03/09/24 0535  AST 106* 52* 59*  ALT 209* 76* 54*  ALKPHOS 65 123 99  BILITOT 1.3* 1.6* 1.2  PROT 4.5* 4.8* 4.8*  ALBUMIN  2.2*  2.1* 1.9*   No results for input(s): LIPASE, AMYLASE in the last 168 hours. No results for input(s): AMMONIA in the last 168 hours. Coagulation Profile: No results for input(s): INR, PROTIME in the last 168 hours. Cardiac Enzymes: No results for input(s): CKTOTAL, CKMB, CKMBINDEX, TROPONINI in the last 168 hours. BNP (last 3 results) No results for input(s): PROBNP in the last 8760 hours. HbA1C: No results for input(s): HGBA1C in the last 72 hours. CBG: Recent Labs  Lab 03/08/24 1534 03/08/24 1942 03/08/24 2339 03/09/24 0312 03/09/24 0737  GLUCAP 124* 136* 141* 141* 126*   Lipid Profile: No results for input(s): CHOL, HDL, LDLCALC, TRIG, CHOLHDL, LDLDIRECT in the last 72 hours. Thyroid  Function Tests: No results for input(s): TSH, T4TOTAL, FREET4, T3FREE, THYROIDAB in the last 72 hours. Anemia Panel: No results for input(s): VITAMINB12, FOLATE, FERRITIN, TIBC, IRON, RETICCTPCT in the last 72 hours. Sepsis Labs: No results for input(s): PROCALCITON, LATICACIDVEN in the last 168 hours.   Recent Results (from the past 240 hours)  Urine Culture     Status: None   Collection Time: 03/01/24  5:57 PM   Specimen: Urine, Catheterized  Result Value Ref Range Status   Specimen Description   Final    URINE, CATHETERIZED Performed at [facility], [address] [place], [place] [address]    Special Requests   Final    NONE Performed at [facility], [address] [place], [place] [address]    Culture   Final    NO GROWTH Performed at [facility], [address], [place], [place] [address]    Report Status 03/02/2024 FINAL  Final  MRSA Next Gen by PCR, Nasal     Status: None   Collection Time: 03/02/24  9:49 AM   Specimen: Nasal Mucosa; Nasal Swab  Result Value Ref Range Status   MRSA by PCR Next Gen NOT DETECTED NOT DETECTED Final    Comment: (NOTE) The GeneXpert  MRSA Assay (FDA approved for NASAL specimens only), is one component of a comprehensive MRSA colonization surveillance program. It is not intended to  diagnose MRSA infection nor to guide or monitor treatment for MRSA infections. Test performance is not FDA approved in patients less than 96 years old. Performed at [facility], [address] [place], [place] [address]   Urine Culture     Status: None   Collection Time: 03/06/24  4:37 PM   Specimen: Urine, Catheterized  Result Value Ref Range Status   Specimen Description   Final    URINE, CATHETERIZED Performed at [facility], [address], [place], [place] [address]    Special Requests   Final    NONE Performed at [facility], [address] [place], [place] [address]    Culture   Final    NO GROWTH Performed at [facility], [address], [place], [place] [address]    Report Status 03/07/2024 FINAL  Final     Radiology Studies: No results found.   Scheduled Meds:  buPROPion   150 mg Oral QPM   Chlorhexidine  Gluconate Cloth  6 each Topical Daily   ezetimibe   10 mg Per Tube Daily   gabapentin   300 mg Oral Q8H   heparin  injection (subcutaneous)  5,000 Units Subcutaneous Q8H   insulin  aspart  0-15 Units Subcutaneous Q4H   levothyroxine   75 mcg Per Tube QAC breakfast   lidocaine   1 patch Transdermal Q24H   melatonin  3 mg Oral QHS   pantoprazole  (PROTONIX ) IV  40 mg Intravenous Q24H   polyethylene glycol  17 g Oral Daily   sodium chloride  flush  10-40 mL Intracatheter Q12H   thiamine  (VITAMIN B1) injection  100 mg Intravenous Q24H   Continuous Infusions:  piperacillin -tazobactam (ZOSYN )  IV 12.5 mL/hr at 03/09/24 0659   TPN ADULT (ION) 40 mL/hr at 03/09/24 0659     LOS: 9 days   Fredia Skeeter, MD [facility]  03/09/2024, 7:54 AM   *Please note that this is a verbal dictation therefore any spelling or grammatical errors are due to the  [employer] system interpretation.  Please page via Amion and do not message via secure chat for urgent patient care matters. Secure chat can be used for non urgent patient care matters.  How to contact the [facility] Attending or Consulting provider 7A - 7P or covering provider during after hours 7P -7A, for this patient?  Check the care team in [facility] and look for a) attending/consulting [facility] provider listed and b) the [facility] team listed. Page or secure chat 7A-7P. Log into [email or web address] and use [facility]'s universal password to access. If you do not have the password, please contact the hospital operator. Locate the [facility] provider you are looking for under [facility] and page to a number that you can be directly reached. If you still have difficulty reaching the provider, please page the [facility] (Director on Call) for the Hospitalists listed on amion for assistance.

## 2024-03-09 NOTE — Consult Note (Addendum)
 WOC Nurse Wound Consult Note: Surgeon and PA at bedside to assess wound and stoma appearance.  Vac change to full thickness post-op wound to midline abd, beefy red. 13X4X5cm. Mod amt tan drainage in the cannister. Pt was medicated for pain prior to the procedure and tolerated with mod amt discomfort. Applied one piece black foam to cont suction. Applied barrier ring to lower edges to attempt to maintain a seal.  WOC team will plan to change dressing again on Mon.    WOC Nurse ostomy follow up Applied barrier ring and one piece convex pouch to attempt to maintain a seal, Pt is in ICU and looked at the stoma with a hand held mirror but did not participate. No family at the bedside.  Stoma is improved, 85% red. 15% yellow, flush with skin level.  1 1/2 inches. Applied barrier ring and one piece convex pouch.  50cc brown liquid in the pouch. Educational materials at the bedside. 3 sets of supplies at the the bedside for staff nurses' use: Use Supplies: barrier rings, Gerlean # [phone], convex pouches Gerlean # [phone] Enrolled patient in [place] Secure Start Discharge program: Yes, today WOC team will perform pouch change again Mon and begin teaching when Pt is stable and out of ICU.  Thank-you,  Stephane Fought MSN, RN, CWOCN, [place], CNS [phone]

## 2024-03-09 NOTE — Telephone Encounter (Signed)
 Called [name] (daughter) and gave her an update per [name], NP.

## 2024-03-09 NOTE — Progress Notes (Signed)
 Physical Therapy Treatment Patient Details Name: Alexandria Durham MRN: [id number] DOB: [birth year] Today's Date: 03/09/2024   History of Present Illness Patient is a 78 year old female who presented on 03/01/2024 following a D and C on 7/1 for thickened endometrium and procedure complicated due to bowel perforation. Pt developed signs and symptoms of sepsis and shock. 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  Pt required post op ventilation and extubated on 7/4.  Pt PMH includes but is not limited to:  recent hospital admissions for diverticulitis, pancreatitis, abdominal pain, D&C  01/06/24 with uterine perforation, cholecystectomy and d&C on 01/17/24, DM II, HTN, COPD, GERD and CKD III.    PT Comments  The patient is very groggy, having difficulty holding items, jerking in the arms.  Patient required max/total assist of 2 to roll and to sit up onto bed edge, more assist than OT documentation for 7/9. Patient  required max/total assist to power up to stand in 4 trials but not maintaining balance, posterior lean, again jerking of the arms and unable to maintain grip on RW. Patient will benefit from continued inpatient follow up therapy, <3 hours/day     If plan is discharge home, recommend the following: Two people to help with walking and/or transfers;A lot of help with bathing/dressing/bathroom;Assistance with cooking/housework;Assist for transportation   Can travel by private vehicle     No  Equipment Recommendations  Wheelchair cushion (measurements PT);Wheelchair (measurements PT);Hoyer lift;Hospital bed;BSC/3in1    Recommendations for Other Services       Precautions / Restrictions Precautions Precautions: Fall Recall of Precautions/Restrictions: Impaired Precaution/Restrictions Comments: Abdominal sx - wound vac, JP drain, ostomy, abdominal binder- for OOB activity Restrictions Weight Bearing Restrictions Per Provider Order: No     Mobility  Bed Mobility   Bed  Mobility: Rolling, Sidelying to Sit, Sit to Sidelying Rolling: Max assist, +2 for safety/equipment Sidelying to sit: Max assist, +2 for safety/equipment, HOB elevated, Used rails   Sit to supine: Max assist, +2 for safety/equipment, +2 for physical assistance   General bed mobility comments: + 2 total/max for management of lines, drain and tubes, assist legs over edge and trunk to upright using bed pad and maxi slide to facilitate sliding around    Transfers Overall transfer level: Needs assistance   Transfers: Sit to/from Stand Sit to Stand: +2 safety/equipment, +2 physical assistance, From elevated surface, Max assist           General transfer comment: max to  stand from bed, posterior leaning , use of bed  pad to lift buttocks, cleared inches firs 3 trial, 4th stood more upright but not fully to step    Ambulation/Gait                   Stairs             Wheelchair Mobility     Tilt Bed    Modified Rankin (Stroke Patients Only)       Balance Overall balance assessment: Needs assistance Sitting-balance support: Bilateral upper extremity supported, Feet supported Sitting balance-Leahy Scale: Zero Sitting balance - Comments: zero to poor posterior bias, difficulty reaching forward  to hold RW, gradually min assist for balance   Standing balance support: Bilateral upper extremity supported, During functional activity, Reliant on assistive device for balance Standing balance-Leahy Scale: Poor  Communication Communication Communication: Impaired Factors Affecting Communication: Reduced clarity of speech  Cognition Arousal: Lethargic Behavior During Therapy: Flat affect   PT - Cognitive impairments: Difficult to assess, Initiation, Sequencing, Problem solving, Attention Difficult to assess due to: Impaired communication, Level of arousal                     PT - Cognition Comments: Pt lethargic but  arousable.  Able to state name and birthday.  aroused more with activity .  She was slow to respond at times and speech slurred. Following commands: Impaired Following commands impaired: Follows one step commands with increased time, Follows one step commands inconsistently    Cueing Cueing Techniques: Verbal cues, Gestural cues, Tactile cues  Exercises      General Comments        Pertinent Vitals/Pain Pain Assessment Pain Assessment: 0-10 Pain Score: 10-Worst pain ever Pain Location: abdomen Pain Descriptors / Indicators: Guarding, Grimacing, Moaning Pain Intervention(s): Limited activity within patient's tolerance, Premedicated before session, Monitored during session, Repositioned    Home Living                          Prior Function            PT Goals (current goals can now be found in the care plan section) Progress towards PT goals: Progressing toward goals    Frequency    Min 2X/week      PT Plan      Co-evaluation              AM-PAC PT 6 Clicks Mobility   Outcome Measure  Help needed turning from your back to your side while in a flat bed without using bedrails?: Total Help needed moving from lying on your back to sitting on the side of a flat bed without using bedrails?: Total Help needed moving to and from a bed to a chair (including a wheelchair)?: Total Help needed standing up from a chair using your arms (e.g., wheelchair or bedside chair)?: Total Help needed to walk in hospital room?: Total Help needed climbing 3-5 steps with a railing? : Total 6 Click Score: 6    End of Session Equipment Utilized During Treatment: Gait belt Activity Tolerance: Patient tolerated treatment well;Patient limited by fatigue Patient left: in bed;with call bell/phone within reach;with SCD's reapplied;with bed alarm set Nurse Communication: Mobility status;Need for lift equipment PT Visit Diagnosis: Difficulty in walking, not elsewhere classified  (R26.2);Muscle weakness (generalized) (M62.81);Pain     Time: 1516-1550 PT Time Calculation (min) (ACUTE ONLY): 34 min  Charges:    $Therapeutic Activity: 23-37 mins PT General Charges $$ ACUTE PT VISIT: 1 Visit                    Alexandria Durham PT Acute Rehabilitation Services Office [phone]    Alexandria Durham Norris 03/09/2024, 4:06 PM

## 2024-03-10 ENCOUNTER — Telehealth: Payer: Self-pay | Admitting: Gynecologic Oncology

## 2024-03-10 DIAGNOSIS — N950 Postmenopausal bleeding: Secondary | ICD-10-CM | POA: Diagnosis not present

## 2024-03-10 LAB — GLUCOSE, CAPILLARY
Glucose-Capillary: 135 mg/dL — ABNORMAL HIGH (ref 70–99)
Glucose-Capillary: 136 mg/dL — ABNORMAL HIGH (ref 70–99)
Glucose-Capillary: 142 mg/dL — ABNORMAL HIGH (ref 70–99)
Glucose-Capillary: 151 mg/dL — ABNORMAL HIGH (ref 70–99)
Glucose-Capillary: 174 mg/dL — ABNORMAL HIGH (ref 70–99)
Glucose-Capillary: 175 mg/dL — ABNORMAL HIGH (ref 70–99)

## 2024-03-10 LAB — CBC
HCT: 29 % — ABNORMAL LOW (ref 36.0–46.0)
Hemoglobin: 9.9 g/dL — ABNORMAL LOW (ref 12.0–15.0)
MCH: 29.9 pg (ref 26.0–34.0)
MCHC: 34.1 g/dL (ref 30.0–36.0)
MCV: 87.6 fL (ref 80.0–100.0)
Platelets: 118 K/uL — ABNORMAL LOW (ref 150–400)
RBC: 3.31 MIL/uL — ABNORMAL LOW (ref 3.87–5.11)
RDW: 17 % — ABNORMAL HIGH (ref 11.5–15.5)
WBC: 5.6 K/uL (ref 4.0–10.5)
nRBC: 0 % (ref 0.0–0.2)

## 2024-03-10 LAB — BASIC METABOLIC PANEL WITH GFR
Anion gap: 7 (ref 5–15)
BUN: 23 mg/dL (ref 8–23)
CO2: 24 mmol/L (ref 22–32)
Calcium: 7.9 mg/dL — ABNORMAL LOW (ref 8.9–10.3)
Chloride: 98 mmol/L (ref 98–111)
Creatinine, Ser: 1.01 mg/dL — ABNORMAL HIGH (ref 0.44–1.00)
GFR, Estimated: 58 mL/min — ABNORMAL LOW (ref >60)
Glucose, Bld: 137 mg/dL — ABNORMAL HIGH (ref 70–99)
Potassium: 4.7 mmol/L (ref 3.5–5.1)
Sodium: 129 mmol/L — ABNORMAL LOW (ref 135–145)

## 2024-03-10 LAB — PHOSPHORUS: Phosphorus: 3.7 mg/dL (ref 2.5–4.6)

## 2024-03-10 LAB — MAGNESIUM: Magnesium: 1.7 mg/dL (ref 1.7–2.4)

## 2024-03-10 MED ORDER — GABAPENTIN 300 MG PO CAPS
300.0000 mg | ORAL_CAPSULE | Freq: Three times a day (TID) | ORAL | Status: DC
  Administered 2024-03-10 – 2024-04-04 (×75): 300 mg via ORAL
  Filled 2024-03-10 (×75): qty 1

## 2024-03-10 MED ORDER — TRAVASOL 10 % IV SOLN
INTRAVENOUS | Status: AC
  Filled 2024-03-10: qty 1044

## 2024-03-10 MED ORDER — AMOXICILLIN-POT CLAVULANATE 875-125 MG PO TABS
1.0000 | ORAL_TABLET | Freq: Two times a day (BID) | ORAL | Status: DC
  Administered 2024-03-10 – 2024-03-13 (×7): 1 via ORAL
  Filled 2024-03-10 (×7): qty 1

## 2024-03-10 MED ORDER — SODIUM CHLORIDE 0.9% FLUSH
10.0000 mL | Freq: Two times a day (BID) | INTRAVENOUS | Status: DC
  Administered 2024-03-10 – 2024-03-18 (×17): 10 mL
  Administered 2024-03-19: 30 mL
  Administered 2024-03-19 – 2024-03-27 (×16): 10 mL
  Administered 2024-03-27: 20 mL
  Administered 2024-03-28: 10 mL
  Administered 2024-03-28: 30 mL
  Administered 2024-03-29 – 2024-04-04 (×13): 10 mL

## 2024-03-10 MED ORDER — SODIUM CHLORIDE 0.9% FLUSH
10.0000 mL | INTRAVENOUS | Status: DC | PRN
  Administered 2024-03-16 – 2024-04-03 (×3): 10 mL

## 2024-03-10 NOTE — Progress Notes (Signed)
 GYN Oncology Progress Note  9 Days Post-Op Procedure(s) (LRB): LAPAROTOMY, EXPLORATORY (N/A) SIGMOIDOSCOPY COLECTOMY, WITH COLOSTOMY CREATION MOBILIZATION, SPLENIC FLEXURE, WITH PARTIAL COLECTOMY CYSTOSCOPY  Above procedure performed after recent D&C procedure on 02/29/2024 for thickened endometrium. D&C procedure complicated by bowel perforation. Patient taken back to the OR on 03/01/24 after developing signs of sepsis,shock (hypotension, tachycardia, peritonitis symptoms). She was started on zosyn  on 03/01/2024 and this has been continued. She was admitted to ICU post-op on ventilation, ultimately extubated. TPN started 03/06/24. Moved to med/surg floor from ICU on 03/09/24. Ostomy with stool and flatus this am. Patient continues to be more alert.  Subjective: Patient currently resting in bed. Oriented when awake. Tolerating clear liquids with no nausea or emesis. Took a few steps with PT yesterday and stood at side of bed. No dyspnea or chest pain. Abdominal pain still remains intermittently. No concerns voiced.  Objective: Vital signs in last 24 hours: Temp:  [97.7 F (36.5 C)-98.2 F (36.8 C)] 98.2 F (36.8 C) (07/11 0528) Pulse Rate:  [90-102] 93 (07/11 0528) Resp:  [15-26] 18 (07/11 0528) BP: (120-142)/(72-111) 137/72 (07/11 0528) SpO2:  [93 %-100 %] 96 % (07/11 0528) Weight:  [177 lb 4 oz (80.4 kg)] 177 lb 4 oz (80.4 kg) (07/11 0500) Last BM Date : 03/09/24  Intake/Output from previous day: 07/10 0701 - 07/11 0700 In: 2054.9 [P.O.:810; I.V.:1123.8; IV Piggyback:121.1] Out: 4370 [Urine:4250; Drains:20; Stool:100]  Physical Examination: General: Sleeping, responding to stimuli, oriented when awake Resp: Normal work of breathing. Lungs clear. Cardio: Regular sinus rhythm and rate. GI: incision: midline abdominal incision with wound VAC dressing in place and to suction. Abdomen soft, non-distended, tender but less than previous assessments, more active bowel sounds, JP drain with clear  serosanguinous drainage-minimal amount of cloudy tan drainage noted from the insertion site (culture taken yesterday), ostomy with stool and flatus, darkening around the superior edges of stoma much improved. See media for updated photo from yesterday.  Extremities: extremities normal, atraumatic, SCDs in place, edema in bilateral hands Purewick in place with clear yellow urine.  Labs: WBC/Hgb/Hct/Plts:  5.6/9.9/29.0/118 (07/11 0600) BUN/Cr/glu/ALT/AST/amyl/lip:  23/1.01/--/--/--/--/-- (07/11 0600)  Assessment: 78 y.o. s/p Procedure(s): LAPAROTOMY, EXPLORATORY, SIGMOIDOSCOPY, COLECTOMY, WITH COLOSTOMY CREATION, MOBILIZATION, SPLENIC FLEXURE, WITH PARTIAL COLECTOMY, CYSTOSCOPY: stable  CXR and EKG performed 03/05/24 AM due to report of difficulty breathing, chest pressure yesterday; currently on room air. Given increasing WBC count with concern for infection with delirium, CT CAP along with CT head performed 03/05/2024 as well: CT CAP: 1. Colonic diverticulosis with evidence of acute diverticulitis in the sigmoid region. 2. Postoperative diverting left lower quadrant descending colostomy. Surgical drain in the pelvis. 3. Small amount of free fluid in the pelvis is likely reactive or postoperative. 4. 4.6 cm diameter air-fluid level in the anterior pelvis in or adjacent to the sigmoid colon. This may represent an abscess or an air-fluid level within a dilated segment of colon. Repeat imaging with rectal contrast material may help to distinguish. 5. Bilateral pleural effusions with basilar atelectasis or infiltration. This may represent compressive atelectasis or pneumonia. 6. Aortic atherosclerosis. 7. Anterior abdominal wall surgical wound. Diffuse subcutaneous edema.  CT Head: 1. Atrophy and chronic small vessel ischemic changes of the white matter. 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 2. Negative for hemorrhage, mass or large vessel territorial  infarct.  Pain:  Appears to be comfortable. Continues to report abdominal pain intermittently.  Heme: Hgb 9.9 and Hct 29 this am. PLT 118.  Continue to monitor.  Status post 2 units in the operating room.  ID: WBC count 5.6 this am. Afebrile. Currently on zosyn  for bowel perforation from recent D&C. S/P exploratory laparotomy with colectomy and ostomy creation. Per discussion with IR, air filled collection felt to be related to be intraluminal in distal rectosigmoid stump with inflammation in colon from postop setting and diverticulitis. No indication for drain placement.  Culture taken yesterday from drainage at JP site. Per Dr. [name], once tolerating clears and if afebrile can consider transitioning to oral (Augmentin )-this can be adjusted based on culture results if needed.  CV: On monitoring. BP and HR overall stable at this time. Mild tachycardia improved.   GI:  Diet: clear liquids. Started on TPN 03/06/24. Having ostomy output.   GU: Creatinine 1.01. On TPN and clear liquids. Purewick in place. Adequate output reported.   FEN: Am metabolic panel reviewed. Repletion as needed per pharmacy.    Endo: Diabetes. CBG (last 3)  Recent Labs    03/10/24 0034 03/10/24 0409 03/10/24 0726  GLUCAP 151* 142* 136*     Prophylaxis: SCDs, heparin   Plan: Plan for diet advancement to carb mod Continue with current plan of care Transition to oral antibiotic, Augmentin  Culture from JP insertion site drainage pending Physical therapy continued  Continue to monitor I&Os with purewick. Appreciate WO evaluation, management. Continue daily labs SW consult for SNF placement at discharge   LOS: 10 days    Alexandria Durham 03/10/2024, 8:35 AM

## 2024-03-10 NOTE — Plan of Care (Signed)
  Problem: Clinical Measurements: Goal: Ability to maintain clinical measurements within normal limits will improve 03/10/2024 0422 by Epifanio Venetia BIRCH, RN Outcome: Progressing 03/10/2024 0421 by Epifanio Venetia BIRCH, RN Outcome: Progressing Goal: Will remain free from infection 03/10/2024 0422 by Epifanio Venetia BIRCH, RN Outcome: Progressing 03/10/2024 0421 by Epifanio Venetia BIRCH, RN Outcome: Progressing Goal: Diagnostic test results will improve 03/10/2024 0422 by Epifanio Venetia BIRCH, RN Outcome: Progressing 03/10/2024 0421 by Epifanio Venetia BIRCH, RN Outcome: Progressing Goal: Respiratory complications will improve 03/10/2024 0422 by Epifanio Venetia BIRCH, RN Outcome: Progressing 03/10/2024 0421 by Epifanio Venetia BIRCH, RN Outcome: Progressing Goal: Cardiovascular complication will be avoided 03/10/2024 0422 by Epifanio Venetia BIRCH, RN Outcome: Progressing 03/10/2024 0421 by Epifanio Venetia BIRCH, RN Outcome: Progressing   Problem: Activity: Goal: Risk for activity intolerance will decrease 03/10/2024 0422 by Epifanio Venetia BIRCH, RN Outcome: Progressing 03/10/2024 0421 by Epifanio Venetia BIRCH, RN Outcome: Progressing   Problem: Coping: Goal: Level of anxiety will decrease 03/10/2024 0422 by Epifanio Venetia BIRCH, RN Outcome: Progressing 03/10/2024 0421 by Epifanio Venetia BIRCH, RN Outcome: Progressing   Problem: Elimination: Goal: Will not experience complications related to bowel motility 03/10/2024 0422 by Epifanio Venetia BIRCH, RN Outcome: Progressing 03/10/2024 0421 by Epifanio Venetia BIRCH, RN Outcome: Progressing Goal: Will not experience complications related to urinary retention 03/10/2024 0422 by Epifanio Venetia BIRCH, RN Outcome: Progressing 03/10/2024 0421 by Epifanio Venetia BIRCH, RN Outcome: Progressing

## 2024-03-10 NOTE — Progress Notes (Signed)
 PROGRESS NOTE    Alexandria Durham  FMW:[id number] DOB: 1946-03-05 DOA: 02/29/2024 PCP: [name], NP   Brief Narrative:   78 y.o.  F with PMH significant for thickened endometrium, HTN, Type 2 DM, CKD stage IIIa, recent pancreatitis and acute cholecystitis s/p lap chole and D&C in May of this year who was admitted 7/1 for hysteroscopy and repeat D&C.  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 Pt was acidotic post-procedure, so decision made to leave intubated, PCCM consulted in this setting.  Significant events as below.  Patient was eventually transferred under [facility] to comanage on 03/07/2024 while GYN oncology remains the primary service.   Significant Hospital Events: Including procedures, antibiotic start and stop dates in addition to other pertinent events   7/2 admit to ICU post-op, ventilated  7/3 pt remains on low dose neo, weaned off sedation but mental status still precludes SBT 7/4 extubated and weaned off pressors 7/6: chest pain yesterday with flat trops, non-ischemic EKG. CT CAP with diverticulitis of sigmoid. Had CT head which was negative.  7/8-patient transferred under [facility] for continuation of the comanagement. Assessment & Plan:   Principal Problem:   Postmenopausal bleeding Active Problems:   Protein-calorie malnutrition, severe   S/p D&C procedure complicated by bowel perforation S/p partial colectomy and ileostomy  Septic shock, shock resolved  Presented for d&c on 7/1 c/b bowel perforation. OR 7/2 after development of septic shock, zosyn  started. Had ex lap with sigmoidoscopy, partial collection and ileostomy placement. Now off pressors. CT AP 7/6 showing sigmoid diverticulitis, air-fluid level 4.6cm unable to discern abscess vs segment of colon.  Leukocytosis resolved.  On Zosyn . pattient started on TPN.  Management per  primary service.   Chest pain  7/6 complained of chest pressure. Trop x2 flat @ 22. EKG non ischemic. CXR with no acute findings.  Monitor on telemetry.   Hypokalemia: replenished/resolved.   AKI on CKD stage IIIa: Baseline creatinine around 1.2.  Creatinine peaked to 2.07 which has improved better than baseline.     Acute Thrombocytopenia stable and improving No signs of active bleeding and hemoglobin is stable. Likely result of critical illness.  - trend and transfuse prn  - no heparin  for now    Type 2 DM Hypoglycemia: Episodes of hypoglycemia. Was started on D5LR which has been discontinued.  Patient's blood sugar is now controlled.   Hypothyroidism  - con't synthroid  75mcg daily - NPO so cannot receive   Hyperlipidemia  HTN - con't zetia  10mg  daily - NPO to cannot receive  - hydralazine  prn for SBP > 180   Mild hyponatremia: 129 today.  Repeat labs in the morning.  She is asymptomatic.   Respiratory insufficiency: Patient on 2 L of oxygen saturating 96% chest x-ray shows evidence of atelectasis no consolidation.  Echocardiogram  normal ejection fraction as below.  She received a dose of Lasix  40 mg x 1 yesterday.  Chest xray- 7/10  Interval placement of right arm PICC as described. No pneumothorax.Persistent low lung volumes with mild bibasilar atelectasis.  Echo 7/10- Left ventricular ejection fraction, by estimation, is 65 to 70%. The  left ventricle has normal function. The left ventricle has no regional  wall motion abnormalities. There is mild concentric left ventricular  hypertrophy. Left ventricular diastolic  parameters are consistent with Grade I diastolic dysfunction (impaired  relaxation).  Right ventricular systolic function is normal. The right ventricular  size is normal. The mitral valve is normal in structure. No evidence of mitral valve  regurgitation. No evidence of mitral stenosis.  The aortic valve is tricuspid. There is mild calcification of the   aortic valve. Aortic valve regurgitation is not visualized. Aortic valve  sclerosis/calcification is present, without any evidence of aortic  stenosis.   Nutrition Problem: Severe Malnutrition Etiology: acute illness  Signs/Symptoms: energy intake < or equal to 50% for > or equal to 5 days, mild muscle depletion, percent weight loss (10% in 1.5 months) Percent weight loss: 10 % (in 1.5 months)    Interventions: Refer to RD note for recommendations  Estimated body mass index is 30.42 kg/m as calculated from the following:   Height as of this encounter: 5' 4 (1.626 m).   Weight as of this encounter: 80.4 kg.  DVT prophylaxis:heparin  Code Status:full Family Communication: none Disposition Plan:  Status is: Inpatient    Subjective: Feels weak   Objective: Vitals:   03/09/24 1504 03/09/24 2120 03/10/24 0500 03/10/24 0528  BP: 125/80 (!) 120/95  137/72  Pulse: (!) 102 99  93  Resp: 15 16  18   Temp: 97.9 F (36.6 C) 98.2 F (36.8 C)  98.2 F (36.8 C)  TempSrc: Oral Oral  Oral  SpO2: 93% 98%  96%  Weight:   80.4 kg   Height:        Intake/Output Summary (Last 24 hours) at 03/10/2024 0845 Last data filed at 03/10/2024 0600 Gross per 24 hour  Intake 2053.12 ml  Output 4370 ml  Net -2316.88 ml   Filed Weights   03/08/24 0412 03/09/24 0500 03/10/24 0500  Weight: 80.2 kg 80.2 kg 80.4 kg    Examination:  General exam: Appears calm and comfortable  Respiratory system: Clear to auscultation. Respiratory effort normal. Cardiovascular system: S1 & S2 heard, RRR. No JVD, murmurs, rubs, gallops or clicks. No pedal edema. Gastrointestinal system: Abdomen is nondistended, soft and nontender. No organomegaly or masses felt. Normal bowel sounds heard.colostomy liquid stool Central nervous system: Alert and oriented. No focal neurological deficits. Extremities:no edema  Data Reviewed: I have personally reviewed following labs and imaging studies  CBC: Recent Labs  Lab  03/06/24 0447 03/07/24 1210 03/08/24 0353 03/09/24 0535 03/10/24 0600  WBC 18.2* 11.8* 6.8 5.6 5.6  HGB 10.9* 11.5* 10.6* 9.7* 9.9*  HCT 32.5* 32.3* 30.9* 27.7* 29.0*  MCV 89.0 85.2 87.0 87.9 87.6  PLT 54* 62* 65* 86* 118*   Basic Metabolic Panel: Recent Labs  Lab 03/06/24 0447 03/07/24 0359 03/08/24 0353 03/09/24 0535 03/10/24 0600  NA 135 135 132* 127* 129*  K 3.4* 3.4* 3.3* 4.6 4.7  CL 109 105 101 98 98  CO2 17* 22 24 22 24   GLUCOSE 124* 153* 138* 111* 137*  BUN 13 16 21 19 23   CREATININE 1.19* 0.83 0.95 0.96 1.01*  CALCIUM  7.1* 7.8* 7.5* 7.2* 7.9*  MG  --  1.6* 2.1 1.8 1.7  PHOS  --  1.2* 2.3* 3.5 3.7   GFR: Estimated Creatinine Clearance: 48.6 mL/min (A) (by C-G formula based on SCr of 1.01 mg/dL (H)). Liver Function Tests: Recent Labs  Lab 03/04/24 0413 03/07/24 0359 03/09/24 0535  AST 106* 52* 59*  ALT 209* 76* 54*  ALKPHOS 65 123 99  BILITOT 1.3* 1.6* 1.2  PROT 4.5* 4.8* 4.8*  ALBUMIN  2.2* 2.1* 1.9*   No results for input(s): LIPASE, AMYLASE in the last 168 hours.  No results for input(s): AMMONIA in the last 168 hours. Coagulation Profile: No results for input(s): INR, PROTIME in the last 168 hours. Cardiac Enzymes: No results for input(s): CKTOTAL, CKMB, CKMBINDEX, TROPONINI in the last 168 hours. BNP (last 3 results) No results for input(s): PROBNP in the last 8760 hours. HbA1C: No results for input(s): HGBA1C in the last 72 hours. CBG: Recent Labs  Lab 03/09/24 1605 03/09/24 2018 03/10/24 0034 03/10/24 0409 03/10/24 0726  GLUCAP 139* 169* 151* 142* 136*   Lipid Profile: No results for input(s): CHOL, HDL, LDLCALC, TRIG, CHOLHDL, LDLDIRECT in the last 72 hours. Thyroid  Function Tests: No results for input(s): TSH, T4TOTAL, FREET4, T3FREE, THYROIDAB in the last 72 hours. Anemia Panel: No results for input(s): VITAMINB12, FOLATE, FERRITIN, TIBC, IRON, RETICCTPCT in the last 72  hours. Sepsis Labs: No results for input(s): PROCALCITON, LATICACIDVEN in the last 168 hours.  Recent Results (from the past 240 hours)  Urine Culture     Status: None   Collection Time: 03/01/24  5:57 PM   Specimen: Urine, Catheterized  Result Value Ref Range Status   Specimen Description   Final    URINE, CATHETERIZED Performed at [facility], [address] [place], [place] [address]    Special Requests   Final    NONE Performed at [facility], [address], [place], [place] [address]    Culture   Final    NO GROWTH Performed at [facility], [address] [place], [place] [address]    Report Status 03/02/2024 FINAL  Final  MRSA Next Gen by PCR, Nasal     Status: None   Collection Time: 03/02/24  9:49 AM   Specimen: Nasal Mucosa; Nasal Swab  Result Value Ref Range Status   MRSA by PCR Next Gen NOT DETECTED NOT DETECTED Final    Comment: (NOTE) The GeneXpert MRSA Assay (FDA approved for NASAL specimens only), is one component of a comprehensive MRSA colonization surveillance program. It is not intended to diagnose MRSA infection nor to guide or monitor treatment for MRSA infections. Test performance is not FDA approved in patients less than 93 years old. Performed at [facility], [address] [place], [place] [address]   Urine Culture     Status: None   Collection Time: 03/06/24  4:37 PM   Specimen: Urine, Catheterized  Result Value Ref Range Status   Specimen Description   Final    URINE, CATHETERIZED Performed at [facility], [address] [place], [place] [address]    Special Requests   Final    NONE Performed at [facility], [address] [place], [place] [address]    Culture   Final    NO GROWTH Performed at [facility], [address], [place], [place] [address]    Report Status 03/07/2024 FINAL  Final         Radiology  Studies: ECHOCARDIOGRAM COMPLETE Result Date: 03/09/2024    ECHOCARDIOGRAM REPORT   Patient Name:   LAURIAN EDRINGTON Date of Exam: 03/09/2024 Medical Rec #:  [id number]      Height:       64.0 in Accession #:    [id number]     Weight:       176.8 lb Date of Birth:  [birth year]      BSA:          1.857 m Patient Age:    82 years  BP:           142/81 mmHg Patient Gender: F              HR:           98 bpm. Exam Location:  Inpatient Procedure: 2D Echo, Cardiac Doppler, Color Doppler and Intracardiac            Opacification Agent (Both Spectral and Color Flow Doppler were            utilized during procedure). Indications:    CHF  History:        Patient has no prior history of Echocardiogram examinations.  Sonographer:    Therisa Crouch Referring Phys: [id number] RAVI PAHWANI  Sonographer Comments: Technically difficult study due to poor echo windows. IMPRESSIONS  1. Left ventricular ejection fraction, by estimation, is 65 to 70%. The left ventricle has normal function. The left ventricle has no regional wall motion abnormalities. There is mild concentric left ventricular hypertrophy. Left ventricular diastolic parameters are consistent with Grade I diastolic dysfunction (impaired relaxation).  2. Right ventricular systolic function is normal. The right ventricular size is normal.  3. The mitral valve is normal in structure. No evidence of mitral valve regurgitation. No evidence of mitral stenosis.  4. The aortic valve is tricuspid. There is mild calcification of the aortic valve. Aortic valve regurgitation is not visualized. Aortic valve sclerosis/calcification is present, without any evidence of aortic stenosis.  5. The inferior vena cava is normal in size with greater than 50% respiratory variability, suggesting right atrial pressure of 3 mmHg. FINDINGS  Left Ventricle: Left ventricular ejection fraction, by estimation, is 65 to 70%. The left ventricle has normal function. The left ventricle has no regional wall  motion abnormalities. Definity  contrast agent was given IV to delineate the left ventricular  endocardial borders. The left ventricular internal cavity size was normal in size. There is mild concentric left ventricular hypertrophy. Left ventricular diastolic parameters are consistent with Grade I diastolic dysfunction (impaired relaxation). Right Ventricle: The right ventricular size is normal. No increase in right ventricular wall thickness. Right ventricular systolic function is normal. Left Atrium: Left atrial size was normal in size. Right Atrium: Right atrial size was normal in size. Pericardium: There is no evidence of pericardial effusion. Mitral Valve: The mitral valve is normal in structure. No evidence of mitral valve regurgitation. No evidence of mitral valve stenosis. Tricuspid Valve: The tricuspid valve is normal in structure. Tricuspid valve regurgitation is trivial. No evidence of tricuspid stenosis. Aortic Valve: The aortic valve is tricuspid. There is mild calcification of the aortic valve. Aortic valve regurgitation is not visualized. Aortic valve sclerosis/calcification is present, without any evidence of aortic stenosis. Pulmonic Valve: The pulmonic valve was not well visualized. Pulmonic valve regurgitation is not visualized. No evidence of pulmonic stenosis. Aorta: The aortic root is normal in size and structure. Venous: The inferior vena cava is normal in size with greater than 50% respiratory variability, suggesting right atrial pressure of 3 mmHg. IAS/Shunts: No atrial level shunt detected by color flow Doppler.  LEFT VENTRICLE PLAX 2D LVIDd:         3.00 cm     Diastology LVIDs:         2.10 cm     LV e' medial:    7.83 cm/s LV PW:         1.10 cm     LV E/e' medial:  8.8 LV IVS:  1.30 cm     LV e' lateral:   7.83 cm/s LVOT diam:     1.80 cm     LV E/e' lateral: 8.8 LVOT Area:     2.54 cm  LV Volumes (MOD) LV vol d, MOD A2C: 61.0 ml LV vol d, MOD A4C: 71.7 ml LV vol s, MOD A2C: 17.3  ml LV vol s, MOD A4C: 25.6 ml LV SV MOD A2C:     43.7 ml LV SV MOD A4C:     71.7 ml LV SV MOD BP:      47.7 ml RIGHT VENTRICLE         IVC TAPSE (M-mode): 1.9 cm  IVC diam: 1.10 cm LEFT ATRIUM             Index LA diam:        2.90 cm 1.56 cm/m LA Vol (A2C):   39.9 ml 21.49 ml/m LA Vol (A4C):   34.9 ml 18.80 ml/m LA Biplane Vol: 39.7 ml 21.38 ml/m   AORTA Ao Root diam: 3.10 cm Ao Asc diam:  2.90 cm MITRAL VALVE                TRICUSPID VALVE MV Area (PHT): 4.00 cm     TR Peak grad:   28.3 mmHg MV E velocity: 68.60 cm/s   TR Vmax:        266.00 cm/s MV A velocity: 101.00 cm/s MV E/A ratio:  0.68         SHUNTS                             Systemic Diam: 1.80 cm Toribio Fuel MD Electronically signed by Toribio Fuel MD Signature Date/Time: 03/09/2024/10:55:01 PM    Final    DG CHEST PORT 1 VIEW Result Date: 03/09/2024 CLINICAL DATA:  Congestive heart failure. EXAM: PORTABLE CHEST 1 VIEW COMPARISON:  Radiographs 03/05/2024 and 03/02/2024.  CT 03/05/2024. FINDINGS: 0953 hours. Right IJ sheath has been removed in the interval. A right arm PICC has been placed, projecting over the mid right atrium. Persistent low lung volumes with mild bibasilar atelectasis. No edema, significant pleural effusion or pneumothorax. The bones appear unchanged. IMPRESSION: 1. Interval placement of right arm PICC as described. No pneumothorax. 2. Persistent low lung volumes with mild bibasilar atelectasis. Electronically Signed   By: Elsie Perone M.D.   On: 03/09/2024 10:28    Scheduled Meds:  buPROPion   150 mg Oral QPM   Chlorhexidine  Gluconate Cloth  6 each Topical Daily   ezetimibe   10 mg Oral Daily   feeding supplement  1 Container Oral TID BM   heparin  injection (subcutaneous)  5,000 Units Subcutaneous Q8H   insulin  aspart  0-15 Units Subcutaneous Q4H   levothyroxine   75 mcg Oral QAC breakfast   lidocaine   1 patch Transdermal Q24H   melatonin  3 mg Oral QHS   multivitamin with minerals  1 tablet Oral Daily    pantoprazole  (PROTONIX ) IV  40 mg Intravenous Q24H   polyethylene glycol  17 g Oral Daily   sodium chloride  flush  10-40 mL Intracatheter Q12H   sodium chloride  flush  10-40 mL Intracatheter Q12H   thiamine  (VITAMIN B1) injection  100 mg Intravenous Q24H   Continuous Infusions:  piperacillin -tazobactam (ZOSYN )  IV 12.5 mL/hr at 03/10/24 0500   TPN ADULT (ION) 75 mL/hr at 03/10/24 0500     LOS: 10 days    Time spent:  35 min  Almarie KANDICE Hoots, MD  03/10/2024, 8:45 AM

## 2024-03-10 NOTE — Progress Notes (Signed)
 PHARMACY - TOTAL PARENTERAL NUTRITION CONSULT NOTE   Indication: Prolonged ileus  Patient Measurements: Height: 5' 4 (162.6 cm) Weight: 80.4 kg (177 lb 4 oz) IBW/kg (Calculated) : 54.7 TPN AdjBW (KG): 60.8 Body mass index is 30.42 kg/m.  Assessment: 78 year old female s/p hysterectomy with D&C on 7/1. Concern for postoperative bowel perforation. Pt back to OR on 7/2 for ex lap with colectomy and ostomy creation. Pharmacy consulted for TPN.  Glucose / Insulin : pt on Januvia per PTA med list -CBGs mostly within goal < 150 -15u SSI in 24 hrs Electrolytes:  Lytes WNL exc Na low at 129. CoCa is ok at ~9.5 Renal: Scr stable Hepatic: LFTs slightly elevated; alb 1.9 Intake / Output; MIVF:  -UOP improved -Drain down to minimal output -Appears to be having some stool GI Imaging: -7/6 CT A/P 4.6 cm diameter air-fluid level in the anterior pelvis in or adjacent to the sigmoid colon; this may represent an abscess or an air-fluid level within a dilated segment of colon GI Surgeries / Procedures:  -7/2 ex lap, colectomy w/ colostomy -7/1 hysterectomy  Central access: PICC TPN start date: 7/7  Nutritional Goals: Goal TPN rate is 75 mL/hr (provides 104 g of protein and 1778 kcals per day)  RD Assessment: Estimated Needs Total Energy Estimated Needs: 1750-1900 kcals Total Protein Estimated Needs: 90-110 grams Total Fluid Estimated Needs: >/= 1.8L  Current Nutrition:  CLD starting 7/10  Plan:  Continue TPN at goal 75 mL/hr at 1800 Electrolytes in TPN: Na 100 mEq/L, K 50 mEq/L, Ca 5 mEq/L, Mg 5 mEq/L, and Phos 20 mmol/L. Cl:Ac 1:1  Advancing diet per GYN to carb modified. If tolerates well could consider weaning off TPN on 7/12.  Continue multivitamin to PO Continue moderate SSI q4h Thiamine  x 6 days for refeeding risk Monitor TPN labs on Mon/Thurs, daily while monitoring for refeeding and advancing to goal  Rankin Dee, PharmD, BCPS, BCIDP Clinical Pharmacist 03/10/2024  8:04 AM

## 2024-03-10 NOTE — Progress Notes (Signed)
 Occupational Therapy Treatment Patient Details Name: Alexandria Durham MRN: [id number] DOB: [birth year] Today's Date: 03/10/2024   History of present illness Patient is a 78 year old female who presented on 03/01/2024 following a D and C on 7/1 for thickened endometrium and procedure complicated due to bowel perforation. Pt developed signs and symptoms of sepsis and shock. Pt underwent exploratory-laparotomy, partial colectomy and ileostomy on 7/2.  Pt required post-op ventilation and extubated on 7/4.  Pt PMH includes but is not limited to:  recent hospital admissions for diverticulitis, pancreatitis, abdominal pain, D&C  01/06/24 with uterine perforation, cholecystectomy and d&C on 01/17/24, DM II, HTN, COPD, GERD and CKD III.   OT comments  Pt required total assist to don her socks and max assist x2 to perform supine to sit. Once seated EOB, she presented with pronounced posterior leaning, requiring constant support to prevent loss of balance. She tolerated sitting EOB for ~10 minutes. Overall, her movements were slower and very guarded, as she was limited by increased abdominal pain and associated fear of progressive activity. She was further noted to be with slight lethargy, intermittent confusion, and impaired attention. Continue OT plan of care. Patient will benefit from continued inpatient follow up therapy, <3 hours/day.       If plan is discharge home, recommend the following:  Two people to help with walking and/or transfers;Two people to help with bathing/dressing/bathroom;Assistance with cooking/housework;Direct supervision/assist for medications management;Direct supervision/assist for financial management;Assist for transportation;Help with stairs or ramp for entrance;Supervision due to cognitive status   Equipment Recommendations  Other (comment) (to be determined pending progress at next level of care)    Recommendations for Other Services      Precautions / Restrictions  Precautions Precautions: Fall Restrictions Weight Bearing Restrictions Per Provider Order: No Other Position/Activity Restrictions: wound vac, JP drain, ostomy, abdominal binder when out of bed       Mobility Bed Mobility Overal bed mobility: Needs Assistance   Rolling: Max assist, +2 for safety/equipment Sidelying to sit: Max assist, +2 for safety/equipment, HOB elevated, Used rails   Sit to supine: Max assist, +2 for safety/equipment, +2 for physical assistance   General bed mobility comments: + 2 for management of lines, drain and tubes, assist legs over edge and trunk to upright       Balance     Sitting balance-Leahy Scale: Poor Sitting balance - Comments:  (posterior lean and guarding noted)         ADL either performed or assessed with clinical judgement   ADL Overall ADL's : Needs assistance/impaired Eating/Feeding: Set up;Supervision/ safety;Bed level Eating/Feeding Details (indicate cue type and reason): Pt was observed feeding herself ice chips in bed.               Upper Body Dressing Details (indicate cue type and reason): to donn socks Lower Body Dressing: Total assistance;Bed level Lower Body Dressing Details (indicate cue type and reason): to donn socks                      Cognition Arousal:  (mildly lethargic) Behavior During Therapy: Flat affect Cognition: Cognition impaired       Memory impairment (select all impairments): Short-term memory Attention impairment (select first level of impairment): Divided attention, Sustained attention Executive functioning impairment (select all impairments): Problem solving, Initiation            Following commands:  (required increased time and occasional repetition of prompts to follow commands) Following commands impaired:  Follows one step commands inconsistently      Cueing   Cueing Techniques: Verbal cues, Gestural cues, Tactile cues             Pertinent Vitals/ Pain        Pain Assessment Pain Assessment: Faces Pain Score: 7  Pain Location: abdomen Pain Descriptors / Indicators: Guarding, Grimacing, Moaning Pain Intervention(s): Monitored during session, Repositioned   Frequency  Min 2X/week        Progress Toward Goals  OT Goals(current goals can now be found in the care plan section)     Acute Rehab OT Goals Patient Stated Goal: decreased pain OT Goal Formulation: With patient Time For Goal Achievement: 03/22/24 Potential to Achieve Goals: Good  Plan         AM-PAC OT 6 Clicks Daily Activity     Outcome Measure   Help from another person eating meals?: A Little Help from another person taking care of personal grooming?: A Lot Help from another person toileting, which includes using toliet, bedpan, or urinal?: Total Help from another person bathing (including washing, rinsing, drying)?: A Lot Help from another person to put on and taking off regular upper body clothing?: A Lot Help from another person to put on and taking off regular lower body clothing?: Total 6 Click Score: 11    End of Session Equipment Utilized During Treatment: Other (comment) (N/A)  OT Visit Diagnosis: Unsteadiness on feet (R26.81);Muscle weakness (generalized) (M62.81);Pain;Other symptoms and signs involving cognitive function Pain - part of body:  (abdomen)   Activity Tolerance Patient limited by fatigue;Patient limited by pain   Patient Left in bed;with call bell/phone within reach;with bed alarm set   Nurse Communication Mobility status        Time: 8578-8557 OT Time Calculation (min): 21 min  Charges: OT General Charges $OT Visit: 1 Visit OT Treatments $Therapeutic Activity: 8-22 mins    Delanna LITTIE Molt, OTR/L 03/10/2024, 4:09 PM

## 2024-03-10 NOTE — Plan of Care (Signed)
  Problem: Clinical Measurements: Goal: Ability to maintain clinical measurements within normal limits will improve Outcome: Progressing Goal: Will remain free from infection Outcome: Progressing Goal: Diagnostic test results will improve Outcome: Progressing Goal: Respiratory complications will improve Outcome: Progressing Goal: Cardiovascular complication will be avoided Outcome: Progressing   Problem: Activity: Goal: Risk for activity intolerance will decrease Outcome: Progressing   Problem: Coping: Goal: Level of anxiety will decrease Outcome: Progressing   Problem: Elimination: Goal: Will not experience complications related to bowel motility Outcome: Progressing Goal: Will not experience complications related to urinary retention Outcome: Progressing   Problem: Pain Managment: Goal: General experience of comfort will improve and/or be controlled Outcome: Progressing   Problem: Safety: Goal: Ability to remain free from injury will improve Outcome: Progressing   Problem: Skin Integrity: Goal: Risk for impaired skin integrity will decrease Outcome: Progressing   Problem: Fluid Volume: Goal: Ability to maintain a balanced intake and output will improve Outcome: Progressing

## 2024-03-10 NOTE — Telephone Encounter (Signed)
 Spoke with patient's daughter with an update on the patient. She voices concern about patient having intermittent confusion and memory issues. Discussed holding the xanax  for anxiety to see if symptoms improve. At visit this am, patient was answering questions appropriately, sleeping and responding to stimuli. No needs voiced at this time.

## 2024-03-11 DIAGNOSIS — N950 Postmenopausal bleeding: Secondary | ICD-10-CM | POA: Diagnosis not present

## 2024-03-11 LAB — GLUCOSE, CAPILLARY
Glucose-Capillary: 158 mg/dL — ABNORMAL HIGH (ref 70–99)
Glucose-Capillary: 160 mg/dL — ABNORMAL HIGH (ref 70–99)
Glucose-Capillary: 169 mg/dL — ABNORMAL HIGH (ref 70–99)
Glucose-Capillary: 170 mg/dL — ABNORMAL HIGH (ref 70–99)
Glucose-Capillary: 182 mg/dL — ABNORMAL HIGH (ref 70–99)
Glucose-Capillary: 190 mg/dL — ABNORMAL HIGH (ref 70–99)
Glucose-Capillary: 193 mg/dL — ABNORMAL HIGH (ref 70–99)

## 2024-03-11 LAB — BASIC METABOLIC PANEL WITH GFR
Anion gap: 8 (ref 5–15)
BUN: 23 mg/dL (ref 8–23)
CO2: 21 mmol/L — ABNORMAL LOW (ref 22–32)
Calcium: 8.3 mg/dL — ABNORMAL LOW (ref 8.9–10.3)
Chloride: 99 mmol/L (ref 98–111)
Creatinine, Ser: 0.84 mg/dL (ref 0.44–1.00)
GFR, Estimated: >60 mL/min (ref >60)
Glucose, Bld: 170 mg/dL — ABNORMAL HIGH (ref 70–99)
Potassium: 4.7 mmol/L (ref 3.5–5.1)
Sodium: 128 mmol/L — ABNORMAL LOW (ref 135–145)

## 2024-03-11 LAB — CBC
HCT: 27.7 % — ABNORMAL LOW (ref 36.0–46.0)
Hemoglobin: 9.5 g/dL — ABNORMAL LOW (ref 12.0–15.0)
MCH: 30.3 pg (ref 26.0–34.0)
MCHC: 34.3 g/dL (ref 30.0–36.0)
MCV: 88.2 fL (ref 80.0–100.0)
Platelets: 166 K/uL (ref 150–400)
RBC: 3.14 MIL/uL — ABNORMAL LOW (ref 3.87–5.11)
RDW: 16.7 % — ABNORMAL HIGH (ref 11.5–15.5)
WBC: 6.4 K/uL (ref 4.0–10.5)
nRBC: 0 % (ref 0.0–0.2)

## 2024-03-11 MED ORDER — TRAVASOL 10 % IV SOLN
INTRAVENOUS | Status: AC
  Filled 2024-03-11: qty 1044

## 2024-03-11 NOTE — Progress Notes (Signed)
 PROGRESS NOTE    Alexandria Durham  FMW:[id number] DOB: 04-15-1946 DOA: 02/29/2024 PCP: [name], NP   Brief Narrative:   78 y.o.  F with PMH significant for thickened endometrium, HTN, Type 2 DM, CKD stage IIIa, recent pancreatitis and acute cholecystitis s/p lap chole and D&C in May of this year who was admitted 7/1 for hysteroscopy and repeat D&C.  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 Pt was acidotic post-procedure, so decision made to leave intubated, PCCM consulted in this setting.  Significant events as below.  Patient was eventually transferred under [facility] to comanage on 03/07/2024 while GYN oncology remains the primary service.   Significant Hospital Events: Including procedures, antibiotic start and stop dates in addition to other pertinent events   7/2 admit to ICU post-op, ventilated  7/3 pt remains on low dose neo, weaned off sedation but mental status still precludes SBT 7/4 extubated and weaned off pressors 7/6: chest pain yesterday with flat trops, non-ischemic EKG. CT CAP with diverticulitis of sigmoid. Had CT head which was negative.  7/8-patient transferred under [facility] for continuation of the comanagement. Assessment & Plan:   Principal Problem:   Postmenopausal bleeding Active Problems:   Protein-calorie malnutrition, severe   S/p D&C procedure complicated by bowel perforation S/p partial colectomy and ileostomy  Septic shock, shock resolved  Presented for d&c on 7/1 c/b bowel perforation. OR 7/2 after development of septic shock, zosyn  started. Had ex lap with sigmoidoscopy, partial collection and ileostomy placement. Now off pressors. CT AP 7/6 showing sigmoid diverticulitis, air-fluid level 4.6cm unable to discern abscess vs segment of colon.  Leukocytosis resolved.  On Zosyn . pattient started on TPN.  Management per  primary service.   Chest pain  7/6 complained of chest pressure. Trop x2 flat @ 22. EKG non ischemic. CXR with no acute findings.  Monitor on telemetry.   Hypokalemia: replenished/resolved.   AKI on CKD stage IIIa: Resolved creatinine 0.84  Acute Thrombocytopenia resolved  Type 2 DM Hypoglycemia: Resolved on TPN   Hypothyroidism  - con't synthroid  75mcg daily   Hyperlipidemia  HTN - con't zetia  10mg  daily  - hydralazine  prn for SBP > 180   Mild hyponatremia: Sodium 128 being corrected in the TPN    Respiratory insufficiency: Patient on 2 L of oxygen saturating 96% chest x-ray shows evidence of atelectasis no consolidation.   Echocardiogram  normal ejection fraction as below.  She received a dose of Lasix  40 mg x 1 yesterday.  Chest xray- 7/10  Interval placement of right arm PICC as described. No pneumothorax.Persistent low lung volumes with mild bibasilar atelectasis.  Echo 7/10- Left ventricular ejection fraction, by estimation, is 65 to 70%. The  left ventricle has normal function. The left ventricle has no regional  wall motion abnormalities. There is mild concentric left ventricular  hypertrophy. Left ventricular diastolic  parameters are consistent with Grade I diastolic dysfunction (impaired  relaxation).  Right ventricular systolic function is normal. The right ventricular  size is normal. The mitral valve is normal in structure. No evidence of mitral valve  regurgitation. No evidence of mitral stenosis.  The aortic valve is tricuspid. There is mild calcification of the  aortic valve. Aortic valve regurgitation is not visualized. Aortic valve  sclerosis/calcification is present, without any evidence of aortic  stenosis.   Nutrition Problem: Severe Malnutrition Etiology:  acute illness  Signs/Symptoms: energy intake < or equal to 50% for > or equal to 5 days, mild muscle depletion, percent weight loss (10% in 1.5 months) Percent weight loss: 10 % (in 1.5  months)    Interventions: Refer to RD note for recommendations  Estimated body mass index is 30.99 kg/m as calculated from the following:   Height as of this encounter: 5' 4 (1.626 m).   Weight as of this encounter: 81.9 kg.  DVT prophylaxis:heparin  Code Status:full Family Communication: none Disposition Plan:  Status is: Inpatient    Subjective:  Resting in bed was asleep when I walked in awake after I called her name responded appropriately and followed commands  Objective: Vitals:   03/10/24 0528 03/10/24 2011 03/11/24 0500 03/11/24 0609  BP: 137/72 130/75  (!) 167/72  Pulse: 93 (!) 104  99  Resp: 18 17    Temp: 98.2 F (36.8 C) 98.5 F (36.9 C)  98.3 F (36.8 C)  TempSrc: Oral Oral  Oral  SpO2: 96%   100%  Weight:   81.9 kg   Height:        Intake/Output Summary (Last 24 hours) at 03/11/2024 1305 Last data filed at 03/11/2024 0751 Gross per 24 hour  Intake 2280.71 ml  Output 2775 ml  Net -494.29 ml   Filed Weights   03/09/24 0500 03/10/24 0500 03/11/24 0500  Weight: 80.2 kg 80.4 kg 81.9 kg    Examination:  General exam: Appears calm and comfortable  Respiratory system: Clear to auscultation. Respiratory effort normal. Cardiovascular system: S1 & S2 heard, RRR. No JVD, murmurs, rubs, gallops or clicks. No pedal edema. Gastrointestinal system: Abdomen is distended colostomy bag, JP drain, Foley catheter in place  Central nervous system: Awake. Extremities:no edema  Data Reviewed: I have personally reviewed following labs and imaging studies  CBC: Recent Labs  Lab 03/07/24 1210 03/08/24 0353 03/09/24 0535 03/10/24 0600 03/11/24 0549  WBC 11.8* 6.8 5.6 5.6 6.4  HGB 11.5* 10.6* 9.7* 9.9* 9.5*  HCT 32.3* 30.9* 27.7* 29.0* 27.7*  MCV 85.2 87.0 87.9 87.6 88.2  PLT 62* 65* 86* 118* 166   Basic Metabolic Panel: Recent Labs  Lab 03/07/24 0359 03/08/24 0353 03/09/24 0535 03/10/24 0600 03/11/24 0549  NA 135 132* 127* 129* 128*  K 3.4* 3.3* 4.6  4.7 4.7  CL 105 101 98 98 99  CO2 22 24 22 24  21*  GLUCOSE 153* 138* 111* 137* 170*  BUN 16 21 19 23 23   CREATININE 0.83 0.95 0.96 1.01* 0.84  CALCIUM  7.8* 7.5* 7.2* 7.9* 8.3*  MG 1.6* 2.1 1.8 1.7  --   PHOS 1.2* 2.3* 3.5 3.7  --    GFR: Estimated Creatinine Clearance: 59 mL/min (by C-G formula based on SCr of 0.84 mg/dL). Liver Function Tests: Recent Labs  Lab 03/07/24 0359 03/09/24 0535  AST 52* 59*  ALT 76* 54*  ALKPHOS 123 99  BILITOT 1.6* 1.2  PROT 4.8* 4.8*  ALBUMIN  2.1* 1.9*   No results for input(s): LIPASE, AMYLASE in the last 168 hours. No results for input(s): AMMONIA in the last 168 hours. Coagulation Profile: No results for input(s): INR, PROTIME in the last 168 hours. Cardiac Enzymes: No results for input(s): CKTOTAL, CKMB, CKMBINDEX, TROPONINI in the last 168 hours. BNP (last 3 results) No results for input(s): PROBNP in the last 8760 hours. HbA1C: No results for input(s): HGBA1C in the last 72 hours. CBG: Recent Labs  Lab 03/10/24 2008 03/11/24 0010 03/11/24 0406 03/11/24 9281  03/11/24 1120  GLUCAP 175* 169* 170* 158* 182*   Lipid Profile: No results for input(s): CHOL, HDL, LDLCALC, TRIG, CHOLHDL, LDLDIRECT in the last 72 hours. Thyroid  Function Tests: No results for input(s): TSH, T4TOTAL, FREET4, T3FREE, THYROIDAB in the last 72 hours. Anemia Panel: No results for input(s): VITAMINB12, FOLATE, FERRITIN, TIBC, IRON, RETICCTPCT in the last 72 hours. Sepsis Labs: No results for input(s): PROCALCITON, LATICACIDVEN in the last 168 hours.  Recent Results (from the past 240 hours)  Urine Culture     Status: None   Collection Time: 03/01/24  5:57 PM   Specimen: Urine, Catheterized  Result Value Ref Range Status   Specimen Description   Final    URINE, CATHETERIZED Performed at [facility], [address], [place], [place] [address]    Special Requests   Final     NONE Performed at [facility], [address] [place], [place] [address]    Culture   Final    NO GROWTH Performed at [facility], [address], [place], [place] [address]    Report Status 03/02/2024 FINAL  Final  MRSA Next Gen by PCR, Nasal     Status: None   Collection Time: 03/02/24  9:49 AM   Specimen: Nasal Mucosa; Nasal Swab  Result Value Ref Range Status   MRSA by PCR Next Gen NOT DETECTED NOT DETECTED Final    Comment: (NOTE) The GeneXpert MRSA Assay (FDA approved for NASAL specimens only), is one component of a comprehensive MRSA colonization surveillance program. It is not intended to diagnose MRSA infection nor to guide or monitor treatment for MRSA infections. Test performance is not FDA approved in patients less than 15 years old. Performed at [facility], [address] [place], [place] [address]   Urine Culture     Status: None   Collection Time: 03/06/24  4:37 PM   Specimen: Urine, Catheterized  Result Value Ref Range Status   Specimen Description   Final    URINE, CATHETERIZED Performed at [facility], [address], [place], [place] [address]    Special Requests   Final    NONE Performed at [facility], [address], [place], [place] [address]    Culture   Final    NO GROWTH Performed at [facility], [address], [place], [place] [address]    Report Status 03/07/2024 FINAL  Final  Aerobic/Anaerobic Culture w Gram Stain (surgical/deep wound)     Status: None (Preliminary result)   Collection Time: 03/09/24 10:22 AM   Specimen: JP Drain  Result Value Ref Range Status   Specimen Description   Final    JP DRAINAGE Performed at [facility], [address] [place], [place] [address]    Special Requests   Final    NONE Performed at [facility], [address] [place], [place] [address]    Gram Stain NO WBC  SEEN RARE GRAM POSITIVE COCCI   Final   Culture   Final    FEW GRAM NEGATIVE RODS CULTURE REINCUBATED FOR BETTER GROWTH Performed at [facility], [address], [place], [place] [address]    Report Status PENDING  Incomplete         Radiology Studies: ECHOCARDIOGRAM COMPLETE Result Date: 03/09/2024    ECHOCARDIOGRAM REPORT   Patient Name:   DORETTA REMMERT Date of Exam: 03/09/2024 Medical Rec #:  [id number]  Height:       64.0 in Accession #:    [id number]     Weight:       176.8 lb Date of Birth:  [birth year]      BSA:          1.857 m Patient Age:    78 years       BP:           142/81 mmHg Patient Gender: F              HR:           98 bpm. Exam Location:  Inpatient Procedure: 2D Echo, Cardiac Doppler, Color Doppler and Intracardiac            Opacification Agent (Both Spectral and Color Flow Doppler were            utilized during procedure). Indications:    CHF  History:        Patient has no prior history of Echocardiogram examinations.  Sonographer:    Therisa Crouch Referring Phys: [id number] RAVI PAHWANI  Sonographer Comments: Technically difficult study due to poor echo windows. IMPRESSIONS  1. Left ventricular ejection fraction, by estimation, is 65 to 70%. The left ventricle has normal function. The left ventricle has no regional wall motion abnormalities. There is mild concentric left ventricular hypertrophy. Left ventricular diastolic parameters are consistent with Grade I diastolic dysfunction (impaired relaxation).  2. Right ventricular systolic function is normal. The right ventricular size is normal.  3. The mitral valve is normal in structure. No evidence of mitral valve regurgitation. No evidence of mitral stenosis.  4. The aortic valve is tricuspid. There is mild calcification of the aortic valve. Aortic valve regurgitation is not visualized. Aortic valve sclerosis/calcification is present, without any evidence of aortic stenosis.  5. The inferior vena cava is normal in size  with greater than 50% respiratory variability, suggesting right atrial pressure of 3 mmHg. FINDINGS  Left Ventricle: Left ventricular ejection fraction, by estimation, is 65 to 70%. The left ventricle has normal function. The left ventricle has no regional wall motion abnormalities. Definity  contrast agent was given IV to delineate the left ventricular  endocardial borders. The left ventricular internal cavity size was normal in size. There is mild concentric left ventricular hypertrophy. Left ventricular diastolic parameters are consistent with Grade I diastolic dysfunction (impaired relaxation). Right Ventricle: The right ventricular size is normal. No increase in right ventricular wall thickness. Right ventricular systolic function is normal. Left Atrium: Left atrial size was normal in size. Right Atrium: Right atrial size was normal in size. Pericardium: There is no evidence of pericardial effusion. Mitral Valve: The mitral valve is normal in structure. No evidence of mitral valve regurgitation. No evidence of mitral valve stenosis. Tricuspid Valve: The tricuspid valve is normal in structure. Tricuspid valve regurgitation is trivial. No evidence of tricuspid stenosis. Aortic Valve: The aortic valve is tricuspid. There is mild calcification of the aortic valve. Aortic valve regurgitation is not visualized. Aortic valve sclerosis/calcification is present, without any evidence of aortic stenosis. Pulmonic Valve: The pulmonic valve was not well visualized. Pulmonic valve regurgitation is not visualized. No evidence of pulmonic stenosis. Aorta: The aortic root is normal in size and structure. Venous: The inferior vena cava is normal in size with greater than 50% respiratory variability, suggesting right atrial pressure of 3 mmHg. IAS/Shunts: No atrial level shunt detected by color flow Doppler.  LEFT VENTRICLE PLAX 2D LVIDd:  3.00 cm     Diastology LVIDs:         2.10 cm     LV e' medial:    7.83 cm/s LV PW:          1.10 cm     LV E/e' medial:  8.8 LV IVS:        1.30 cm     LV e' lateral:   7.83 cm/s LVOT diam:     1.80 cm     LV E/e' lateral: 8.8 LVOT Area:     2.54 cm  LV Volumes (MOD) LV vol d, MOD A2C: 61.0 ml LV vol d, MOD A4C: 71.7 ml LV vol s, MOD A2C: 17.3 ml LV vol s, MOD A4C: 25.6 ml LV SV MOD A2C:     43.7 ml LV SV MOD A4C:     71.7 ml LV SV MOD BP:      47.7 ml RIGHT VENTRICLE         IVC TAPSE (M-mode): 1.9 cm  IVC diam: 1.10 cm LEFT ATRIUM             Index LA diam:        2.90 cm 1.56 cm/m LA Vol (A2C):   39.9 ml 21.49 ml/m LA Vol (A4C):   34.9 ml 18.80 ml/m LA Biplane Vol: 39.7 ml 21.38 ml/m   AORTA Ao Root diam: 3.10 cm Ao Asc diam:  2.90 cm MITRAL VALVE                TRICUSPID VALVE MV Area (PHT): 4.00 cm     TR Peak grad:   28.3 mmHg MV E velocity: 68.60 cm/s   TR Vmax:        266.00 cm/s MV A velocity: 101.00 cm/s MV E/A ratio:  0.68         SHUNTS                             Systemic Diam: 1.80 cm Toribio Fuel MD Electronically signed by Toribio Fuel MD Signature Date/Time: 03/09/2024/10:55:01 PM    Final     Scheduled Meds:  amoxicillin -clavulanate  1 tablet Oral Q12H   buPROPion   150 mg Oral QPM   Chlorhexidine  Gluconate Cloth  6 each Topical Daily   ezetimibe   10 mg Oral Daily   feeding supplement  1 Container Oral TID BM   gabapentin   300 mg Oral TID   heparin  injection (subcutaneous)  5,000 Units Subcutaneous Q8H   insulin  aspart  0-15 Units Subcutaneous Q4H   levothyroxine   75 mcg Oral QAC breakfast   lidocaine   1 patch Transdermal Q24H   melatonin  3 mg Oral QHS   multivitamin with minerals  1 tablet Oral Daily   pantoprazole  (PROTONIX ) IV  40 mg Intravenous Q24H   polyethylene glycol  17 g Oral Daily   sodium chloride  flush  10-40 mL Intracatheter Q12H   sodium chloride  flush  10-40 mL Intracatheter Q12H   Continuous Infusions:  TPN ADULT (ION) 75 mL/hr at 03/11/24 0700   TPN ADULT (ION)       LOS: 11 days    Time spent: 35 min  Almarie KANDICE Hoots, MD  03/11/2024, 1:05 PM

## 2024-03-11 NOTE — Progress Notes (Addendum)
 PHARMACY - TOTAL PARENTERAL NUTRITION CONSULT NOTE   Indication: Prolonged ileus  Patient Measurements: Height: 5' 4 (162.6 cm) Weight: 81.9 kg (180 lb 8.9 oz) IBW/kg (Calculated) : 54.7 TPN AdjBW (KG): 60.8 Body mass index is 30.99 kg/m.  Assessment: 78 year old female s/p hysterectomy with D&C on 7/1. Concern for postoperative bowel perforation. Pt back to OR on 7/2 for ex lap with colectomy and ostomy creation. Pharmacy consulted for TPN.  Glucose / Insulin : pt on Januvia per PTA med list -CBGs mostly within goal < 150 -17u SSI in 24 hrs Electrolytes:  Lytes WNL exc Na low at 128. CoCa is ok at ~10 Renal: Scr stable Hepatic: LFTs slightly elevated; alb 1.9 Intake / Output; MIVF:  -UOP improved -Drain down to minimal output GI Imaging: -7/6 CT A/P 4.6 cm diameter air-fluid level in the anterior pelvis in or adjacent to the sigmoid colon; this may represent an abscess or an air-fluid level within a dilated segment of colon GI Surgeries / Procedures:  -7/2 ex lap, colectomy w/ colostomy -7/1 hysterectomy  Central access: PICC TPN start date: 7/7  Nutritional Goals: Goal TPN rate is 75 mL/hr (provides 104 g of protein and 1778 kcals per day)  RD Assessment: Estimated Needs Total Energy Estimated Needs: 1750-1900 kcals Total Protein Estimated Needs: 90-110 grams Total Fluid Estimated Needs: >/= 1.8L  Current Nutrition:  CLD starting 7/10  Plan:  Continue TPN at goal 75 mL/hr at 1800 Electrolytes in TPN: Inc Na 120 mEq/L, K 50 mEq/L, Ca 5 mEq/L, Mg 5 mEq/L, and Phos 20 mmol/L. Cl:Ac 1:1  Advancing diet per GYN to carb modified. If tolerates well could consider weaning off TPN    Continue multivitamin to PO Continue moderate SSI q4h Thiamine  x 6 days for refeeding risk completed 7/12 BMP, magnesium , phosphorus with AM labs   Monitor TPN labs on Mon/Thurs, daily while monitoring for refeeding and advancing to goal    Dolphus Roller, PharmD, BCPS 03/11/2024 9:38  AM

## 2024-03-11 NOTE — Anesthesia Postprocedure Evaluation (Signed)
 Anesthesia Post Note  Patient: Alexandria Durham  Procedure(s) Performed: LAPAROTOMY, EXPLORATORY COLECTOMY, WITH COLOSTOMY CREATION MOBILIZATION, SPLENIC FLEXURE, WITH PARTIAL COLECTOMY CYSTOSCOPY SIGMOIDOSCOPY     Patient location during evaluation: ICU Anesthesia Type: General Level of consciousness: patient remains intubated per anesthesia plan Pain management: pain level controlled Vital Signs Assessment: post-procedure vital signs reviewed and stable Respiratory status: patient remains intubated per anesthesia plan Cardiovascular status: blood pressure returned to baseline Postop Assessment: no headache, no backache and no apparent nausea or vomiting Anesthetic complications: no   No notable events documented.  Last Vitals:  Vitals:   03/10/24 2011 03/11/24 0609  BP: 130/75 (!) 167/72  Pulse: (!) 104 99  Resp: 17   Temp: 36.9 C 36.8 C  SpO2:  100%    Last Pain:  Vitals:   03/11/24 0609  TempSrc: Oral  PainSc:    Pain Goal: Patients Stated Pain Goal: 4 (03/11/24 0409)                 Marien L Kayana Thoen

## 2024-03-11 NOTE — Plan of Care (Signed)
  Problem: Clinical Measurements: Goal: Ability to maintain clinical measurements within normal limits will improve Outcome: Progressing Goal: Will remain free from infection Outcome: Progressing Goal: Diagnostic test results will improve Outcome: Progressing Goal: Respiratory complications will improve Outcome: Progressing Goal: Cardiovascular complication will be avoided Outcome: Progressing   Problem: Activity: Goal: Risk for activity intolerance will decrease Outcome: Progressing   Problem: Coping: Goal: Level of anxiety will decrease Outcome: Progressing   Problem: Elimination: Goal: Will not experience complications related to bowel motility Outcome: Progressing Goal: Will not experience complications related to urinary retention Outcome: Progressing   Problem: Pain Managment: Goal: General experience of comfort will improve and/or be controlled Outcome: Progressing   Problem: Safety: Goal: Ability to remain free from injury will improve Outcome: Progressing   Problem: Skin Integrity: Goal: Risk for impaired skin integrity will decrease Outcome: Progressing   Problem: Education: Goal: Ability to describe self-care measures that may prevent or decrease complications (Diabetes Survival Skills Education) will improve Outcome: Progressing Goal: Individualized Educational Video(s) Outcome: Progressing   Problem: Coping: Goal: Ability to adjust to condition or change in health will improve Outcome: Progressing   Problem: Fluid Volume: Goal: Ability to maintain a balanced intake and output will improve Outcome: Progressing   Problem: Health Behavior/Discharge Planning: Goal: Ability to identify and utilize available resources and services will improve Outcome: Progressing Goal: Ability to manage health-related needs will improve Outcome: Progressing   Problem: Metabolic: Goal: Ability to maintain appropriate glucose levels will improve Outcome: Progressing    Problem: Nutritional: Goal: Maintenance of adequate nutrition will improve Outcome: Progressing Goal: Progress toward achieving an optimal weight will improve Outcome: Progressing   Problem: Skin Integrity: Goal: Risk for impaired skin integrity will decrease Outcome: Progressing   Problem: Tissue Perfusion: Goal: Adequacy of tissue perfusion will improve Outcome: Progressing   Problem: Education: Goal: Knowledge of the prescribed therapeutic regimen will improve Outcome: Progressing Goal: Understanding of sexual limitations or changes related to disease process or condition will improve Outcome: Progressing Goal: Individualized Educational Video(s) Outcome: Progressing   Problem: Self-Concept: Goal: Communication of feelings regarding changes in body function or appearance will improve Outcome: Progressing   Problem: Skin Integrity: Goal: Demonstration of wound healing without infection will improve Outcome: Progressing   Problem: Activity: Goal: Ability to tolerate increased activity will improve Outcome: Progressing   Problem: Respiratory: Goal: Ability to maintain a clear airway and adequate ventilation will improve Outcome: Progressing   Problem: Role Relationship: Goal: Method of communication will improve Outcome: Progressing

## 2024-03-11 NOTE — Progress Notes (Signed)
 10 Days Post-Op Procedure(s) (LRB): LAPAROTOMY, EXPLORATORY (N/A) SIGMOIDOSCOPY COLECTOMY, WITH COLOSTOMY CREATION MOBILIZATION, SPLENIC FLEXURE, WITH PARTIAL COLECTOMY CYSTOSCOPY  Subjective: Patient reports doing ok. Having abdominal pain, improved from yesterday. Doesn't have much of an appetite - ordered roast beef last night but it was too tough. Had some potatoes for lunch today. Denies nausea. + flatus and stool in ostomy. Stood and took few steps with PT yesterday.   Objective: Vital signs in last 24 hours: Temp:  [98.3 F (36.8 C)-98.5 F (36.9 C)] 98.3 F (36.8 C) (07/12 0609) Pulse Rate:  [99-104] 99 (07/12 0609) Resp:  [17] 17 (07/11 2011) BP: (130-167)/(72-75) 167/72 (07/12 0609) SpO2:  [100 %] 100 % (07/12 0609) Weight:  [180 lb 8.9 oz (81.9 kg)] 180 lb 8.9 oz (81.9 kg) (07/12 0500) Last BM Date : 03/10/24  Intake/Output from previous day: 07/11 0701 - 07/12 0700 In: 2280.7 [P.O.:360; I.V.:1920.7] Out: 3475 [Urine:3450; Drains:25]  Physical Examination: General: Alert, oriented, no acute distress, more interactive then she has been the last two days I've seen her. Respiratory: Normal work of breathing. Lungs clear to auscultation bilaterally without rhonchi. Cardiovascular: Regular sinus rhythm and rate, no murmurs or rubs. Abdomen: soft, nondistended, + BS, tender to palpation diffusely (improved overall). Incision: midline abdominal incision with wound VAC dressing in place and to suction. JP drain with clear sanguineous fluid. No purulent drainage noted around drain site. Ostomy with stool and gas.  Extremities: extremities warm and well perfused with trace edema, SCDs in place Gu: Purewick in place with clear yellow urine.  Labs:    Latest Ref Rng & Units 03/11/2024    5:49 AM 03/10/2024    6:00 AM 03/09/2024    5:35 AM  CBC  WBC 4.0 - 10.5 K/uL 6.4  5.6  5.6   Hemoglobin 12.0 - 15.0 g/dL 9.5  9.9  9.7   Hematocrit 36.0 - 46.0 % 27.7  29.0  27.7    Platelets 150 - 400 K/uL 166  118  86       Latest Ref Rng & Units 03/11/2024    5:49 AM 03/10/2024    6:00 AM 03/09/2024    5:35 AM  BMP  Glucose 70 - 99 mg/dL 829  862  888   BUN 8 - 23 mg/dL 23  23  19    Creatinine 0.44 - 1.00 mg/dL 9.15  8.98  9.03   Sodium 135 - 145 mmol/L 128  129  127   Potassium 3.5 - 5.1 mmol/L 4.7  4.7  4.6   Chloride 98 - 111 mmol/L 99  98  98   CO2 22 - 32 mmol/L 21  24  22    Calcium  8.9 - 10.3 mg/dL 8.3  7.9  7.2    Culture from around drain site 7/10: Rare gram positive cocci on gram stain, few gram negative rods on culture; awaiting reincubation   Assessment:  78 y.o. s/p Procedure(s): LAPAROTOMY, EXPLORATORY SIGMOIDOSCOPY COLECTOMY, WITH COLOSTOMY CREATION MOBILIZATION, SPLENIC FLEXURE, WITH PARTIAL COLECTOMY CYSTOSCOPY  Post-Op: Overall working on post-op milestones. Improved bowel function. Working on increasing PO intake with plan to wean TPN once intake improves. Discussed importance of getting up at least once a day, even if to sit on side of bed or stand. Excellent UOP.   Leukocytosis: resolved. Patient transitioned from Zosyn  to Augmentin  now that she is tolerating PO for planned 14 day total course. Purulent drainage from around JP drain site - culture with very little growth, awaiting results from  reincubation. Afebrile.   Malnutrition: on TPN given prolonged period of NPO. Will work on weaning TPN as PO intake improves. Albumin  2.8 early during this admission. Continue nutritional supplements.  Hyponatremia: asymptomatic. 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    Type 2 diabetes mellitus: CBGs 135-182 in last 24 hours. Continue insulin  sliding scale.   Prophylaxis: SCDs, heparin  - given improved kidney function, could transition to daily lovenox .  Physical deconditioning: PT ordered. Up and out of bed as able. Plan for SNF when medically stable for discharge.    LOS: 11 days    Alexandria Durham 03/11/2024, 1:30 PM

## 2024-03-11 NOTE — Plan of Care (Signed)
  Problem: Clinical Measurements: Goal: Ability to maintain clinical measurements within normal limits will improve 03/11/2024 0348 by Epifanio Venetia BIRCH, RN Outcome: Progressing 03/11/2024 0348 by Epifanio Venetia BIRCH, RN Outcome: Progressing Goal: Will remain free from infection 03/11/2024 0348 by Epifanio Venetia BIRCH, RN Outcome: Progressing 03/11/2024 0348 by Epifanio Venetia BIRCH, RN Outcome: Progressing Goal: Diagnostic test results will improve 03/11/2024 0348 by Epifanio Venetia BIRCH, RN Outcome: Progressing 03/11/2024 0348 by Epifanio Venetia BIRCH, RN Outcome: Progressing Goal: Respiratory complications will improve 03/11/2024 0348 by Epifanio Venetia BIRCH, RN Outcome: Progressing 03/11/2024 0348 by Epifanio Venetia BIRCH, RN Outcome: Progressing Goal: Cardiovascular complication will be avoided 03/11/2024 0348 by Epifanio Venetia BIRCH, RN Outcome: Progressing 03/11/2024 0348 by Epifanio Venetia BIRCH, RN Outcome: Progressing   Problem: Pain Managment: Goal: General experience of comfort will improve and/or be controlled 03/11/2024 0348 by Epifanio Venetia BIRCH, RN Outcome: Progressing 03/11/2024 0348 by Epifanio Venetia BIRCH, RN Outcome: Progressing   Problem: Elimination: Goal: Will not experience complications related to bowel motility 03/11/2024 0348 by Epifanio Venetia BIRCH, RN Outcome: Progressing 03/11/2024 0348 by Epifanio Venetia BIRCH, RN Outcome: Progressing Goal: Will not experience complications related to urinary retention 03/11/2024 0348 by Epifanio Venetia BIRCH, RN Outcome: Progressing 03/11/2024 0348 by Epifanio Venetia BIRCH, RN Outcome: Progressing   Problem: Skin Integrity: Goal: Risk for impaired skin integrity will decrease 03/11/2024 0348 by Epifanio Venetia BIRCH, RN Outcome: Progressing 03/11/2024 0348 by Epifanio Venetia BIRCH, RN Outcome: Progressing   Problem: Safety: Goal: Ability to remain free from injury will improve 03/11/2024 0348 by Epifanio Venetia BIRCH, RN Outcome: Progressing 03/11/2024 0348 by Epifanio Venetia BIRCH, RN Outcome: Progressing

## 2024-03-12 DIAGNOSIS — N950 Postmenopausal bleeding: Secondary | ICD-10-CM | POA: Diagnosis not present

## 2024-03-12 LAB — BASIC METABOLIC PANEL WITH GFR
Anion gap: 9 (ref 5–15)
BUN: 26 mg/dL — ABNORMAL HIGH (ref 8–23)
CO2: 22 mmol/L (ref 22–32)
Calcium: 8.9 mg/dL (ref 8.9–10.3)
Chloride: 100 mmol/L (ref 98–111)
Creatinine, Ser: 0.96 mg/dL (ref 0.44–1.00)
GFR, Estimated: >60 mL/min (ref >60)
Glucose, Bld: 177 mg/dL — ABNORMAL HIGH (ref 70–99)
Potassium: 5.2 mmol/L — ABNORMAL HIGH (ref 3.5–5.1)
Sodium: 131 mmol/L — ABNORMAL LOW (ref 135–145)

## 2024-03-12 LAB — GLUCOSE, CAPILLARY
Glucose-Capillary: 205 mg/dL — ABNORMAL HIGH (ref 70–99)
Glucose-Capillary: 183 mg/dL — ABNORMAL HIGH (ref 70–99)
Glucose-Capillary: 200 mg/dL — ABNORMAL HIGH (ref 70–99)
Glucose-Capillary: 215 mg/dL — ABNORMAL HIGH (ref 70–99)
Glucose-Capillary: 234 mg/dL — ABNORMAL HIGH (ref 70–99)

## 2024-03-12 LAB — CBC
HCT: 26.3 % — ABNORMAL LOW (ref 36.0–46.0)
Hemoglobin: 8.8 g/dL — ABNORMAL LOW (ref 12.0–15.0)
MCH: 29.7 pg (ref 26.0–34.0)
MCHC: 33.5 g/dL (ref 30.0–36.0)
MCV: 88.9 fL (ref 80.0–100.0)
Platelets: 241 K/uL (ref 150–400)
RBC: 2.96 MIL/uL — ABNORMAL LOW (ref 3.87–5.11)
RDW: 16.7 % — ABNORMAL HIGH (ref 11.5–15.5)
WBC: 8.2 K/uL (ref 4.0–10.5)
nRBC: 0 % (ref 0.0–0.2)

## 2024-03-12 LAB — MAGNESIUM: Magnesium: 1.7 mg/dL (ref 1.7–2.4)

## 2024-03-12 LAB — PHOSPHORUS: Phosphorus: 3.5 mg/dL (ref 2.5–4.6)

## 2024-03-12 MED ORDER — SODIUM ZIRCONIUM CYCLOSILICATE 10 G PO PACK
10.0000 g | PACK | Freq: Once | ORAL | Status: AC
  Administered 2024-03-12: 10 g via ORAL
  Filled 2024-03-12: qty 1

## 2024-03-12 MED ORDER — TRAVASOL 10 % IV SOLN
INTRAVENOUS | Status: AC
  Filled 2024-03-12: qty 1044

## 2024-03-12 NOTE — Plan of Care (Signed)
  Problem: Clinical Measurements: Goal: Ability to maintain clinical measurements within normal limits will improve Outcome: Progressing Goal: Will remain free from infection Outcome: Progressing Goal: Diagnostic test results will improve Outcome: Progressing Goal: Respiratory complications will improve Outcome: Progressing   Problem: Activity: Goal: Risk for activity intolerance will decrease Outcome: Progressing   Problem: Coping: Goal: Level of anxiety will decrease Outcome: Progressing   Problem: Elimination: Goal: Will not experience complications related to bowel motility Outcome: Progressing Goal: Will not experience complications related to urinary retention Outcome: Progressing   Problem: Pain Managment: Goal: General experience of comfort will improve and/or be controlled Outcome: Progressing   Problem: Safety: Goal: Ability to remain free from injury will improve Outcome: Progressing   Problem: Skin Integrity: Goal: Risk for impaired skin integrity will decrease Outcome: Progressing   Problem: Education: Goal: Ability to describe self-care measures that may prevent or decrease complications (Diabetes Survival Skills Education) will improve Outcome: Progressing   Problem: Coping: Goal: Ability to adjust to condition or change in health will improve Outcome: Progressing   Problem: Fluid Volume: Goal: Ability to maintain a balanced intake and output will improve Outcome: Progressing   Problem: Health Behavior/Discharge Planning: Goal: Ability to identify and utilize available resources and services will improve Outcome: Progressing Goal: Ability to manage health-related needs will improve Outcome: Progressing   Problem: Metabolic: Goal: Ability to maintain appropriate glucose levels will improve Outcome: Progressing   Problem: Nutritional: Goal: Maintenance of adequate nutrition will improve Outcome: Progressing Goal: Progress toward achieving an  optimal weight will improve Outcome: Progressing   Problem: Skin Integrity: Goal: Risk for impaired skin integrity will decrease Outcome: Progressing   Problem: Tissue Perfusion: Goal: Adequacy of tissue perfusion will improve Outcome: Progressing   Problem: Education: Goal: Knowledge of the prescribed therapeutic regimen will improve Outcome: Progressing   Problem: Skin Integrity: Goal: Demonstration of wound healing without infection will improve Outcome: Progressing   Problem: Respiratory: Goal: Ability to maintain a clear airway and adequate ventilation will improve Outcome: Progressing   Problem: Role Relationship: Goal: Method of communication will improve Outcome: Progressing

## 2024-03-12 NOTE — Plan of Care (Signed)
  Problem: Clinical Measurements: Goal: Ability to maintain clinical measurements within normal limits will improve Outcome: Progressing Goal: Will remain free from infection Outcome: Progressing Goal: Diagnostic test results will improve Outcome: Progressing Goal: Respiratory complications will improve Outcome: Progressing Goal: Cardiovascular complication will be avoided Outcome: Progressing   Problem: Activity: Goal: Risk for activity intolerance will decrease Outcome: Progressing   Problem: Coping: Goal: Level of anxiety will decrease Outcome: Progressing   Problem: Pain Managment: Goal: General experience of comfort will improve and/or be controlled Outcome: Progressing   Problem: Safety: Goal: Ability to remain free from injury will improve Outcome: Progressing

## 2024-03-12 NOTE — Progress Notes (Signed)
 11 Days Post-Op Procedure(s) (LRB): LAPAROTOMY, EXPLORATORY (N/A) SIGMOIDOSCOPY COLECTOMY, WITH COLOSTOMY CREATION MOBILIZATION, SPLENIC FLEXURE, WITH PARTIAL COLECTOMY CYSTOSCOPY  Subjective: Patient reports doing ok. Still having abdominal pain, similar to yesterday. Did not get out of bed yesterday. Denies nausea or emesis, appetite still poor. Only had portion of two meals yesterday.   Objective: Vital signs in last 24 hours: Temp:  [98.1 F (36.7 C)] 98.1 F (36.7 C) (07/13 0508) Pulse Rate:  [96-97] 96 (07/13 0508) Resp:  [20] 20 (07/13 0508) BP: (129-156)/(68-80) 129/68 (07/13 0508) SpO2:  [93 %-94 %] 94 % (07/13 0508) Weight:  [163 lb 2.3 oz (74 kg)] 163 lb 2.3 oz (74 kg) (07/13 0412) Last BM Date : 03/12/24  Intake/Output from previous day: 07/12 0701 - 07/13 0700 In: 2036.7 [P.O.:480; I.V.:1556.7] Out: 1935 [Urine:1800; Drains:35; Stool:100]  Physical Examination: General: Alert, oriented, no acute distress Respiratory: Normal work of breathing. Lungs clear to auscultation bilaterally without rhonchi. Cardiovascular: Regular sinus rhythm and rate, no murmurs or rubs. Abdomen: soft, nondistended, + BS, tender to palpation diffusely (stable). Incision: midline abdominal incision with wound VAC dressing in place and to suction. JP drain with clear sanguineous fluid. No purulent drainage noted around drain site. Ostomy with stool and gas.  Extremities: extremities warm and well perfused with trace edema, SCDs in place Gu: Purewick in place with clear yellow urine  Labs:    Latest Ref Rng & Units 03/12/2024    5:46 AM 03/11/2024    5:49 AM 03/10/2024    6:00 AM  CBC  WBC 4.0 - 10.5 K/uL 8.2  6.4  5.6   Hemoglobin 12.0 - 15.0 g/dL 8.8  9.5  9.9   Hematocrit 36.0 - 46.0 % 26.3  27.7  29.0   Platelets 150 - 400 K/uL 241  166  118       Latest Ref Rng & Units 03/12/2024    5:46 AM 03/11/2024    5:49 AM 03/10/2024    6:00 AM  BMP  Glucose 70 - 99 mg/dL 822  829  862    BUN 8 - 23 mg/dL 26  23  23    Creatinine 0.44 - 1.00 mg/dL 9.03  9.15  8.98   Sodium 135 - 145 mmol/L 131  128  129   Potassium 3.5 - 5.1 mmol/L 5.2  4.7  4.7   Chloride 98 - 111 mmol/L 100  99  98   CO2 22 - 32 mmol/L 22  21  24    Calcium  8.9 - 10.3 mg/dL 8.9  8.3  7.9    Assessment:  78 y.o. s/p Procedure(s): LAPAROTOMY, EXPLORATORY SIGMOIDOSCOPY COLECTOMY, WITH COLOSTOMY CREATION MOBILIZATION, SPLENIC FLEXURE, WITH PARTIAL COLECTOMY CYSTOSCOPY  Post-Op: Overall working on post-op milestones. Improved bowel function, continue charting ostomy output. Working on increasing PO intake with plan to wean TPN once intake improves. Discussed importance of getting up at least once a day, even if to sit on side of bed or stand. Excellent UOP.    Leukocytosis: resolved. Patient transitioned from Zosyn  to Augmentin  now that she is tolerating PO for planned 14 day total course. Purulent drainage from around JP drain site - culture with few e coli and enterococcus - susceptibilities pending. Afebrile.    Malnutrition: on TPN given prolonged period of NPO. Will work on weaning TPN as PO intake improves. Given poor PO intake over last 24 hours, will continue TPN at current rate. Albumin  2.8 early during this admission. Continue nutritional supplements.   Hyponatremia: asymptomatic,  improving.   Acute on chronic anemia: asymptomatic, continue to monitor.   Type 2 diabetes mellitus: CBGs 160-205 in last 24 hours. Continue insulin  sliding scale.    Prophylaxis: SCDs, heparin  - given improved kidney function, could transition to daily lovenox .   Physical deconditioning: PT ordered. Up and out of bed as able. Plan for SNF when medically stable for discharge.    LOS: 12 days    Alexandria Durham 03/12/2024, 1:17 PM

## 2024-03-12 NOTE — Progress Notes (Addendum)
 PROGRESS NOTE    Alexandria Durham  FMW:[id number] DOB: Nov 22, 1945 DOA: 02/29/2024 PCP: [name], NP   Brief Narrative:   78 y.o.  F with PMH significant for thickened endometrium, HTN, Type 2 DM, CKD stage IIIa, recent pancreatitis and acute cholecystitis s/p lap chole and D&C in May of this year who was admitted 7/1 for hysteroscopy and repeat D&C.  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 Pt was acidotic post-procedure, so decision made to leave intubated, PCCM consulted in this setting.  Significant events as below.  Patient was eventually transferred under [facility] to comanage on 03/07/2024 while GYN oncology remains the primary service.   Significant Hospital Events: Including procedures, antibiotic start and stop dates in addition to other pertinent events   7/2 admit to ICU post-op, ventilated  7/3 pt remains on low dose neo, weaned off sedation but mental status still precludes SBT 7/4 extubated and weaned off pressors 7/6: chest pain yesterday with flat trops, non-ischemic EKG. CT CAP with diverticulitis of sigmoid. Had CT head which was negative.  7/8-patient transferred under [facility] for continuation of the comanagement. Assessment & Plan:   Principal Problem:   Postmenopausal bleeding Active Problems:   Protein-calorie malnutrition, severe   S/p D&C procedure complicated by bowel perforation S/p partial colectomy and ileostomy  Septic shock, shock resolved  Presented for d&c on 7/1 c/b bowel perforation. OR 7/2 after development of septic shock, zosyn  started. Had ex lap with sigmoidoscopy, partial collection and ileostomy placement. Now off pressors. CT AP 7/6 showing sigmoid diverticulitis, air-fluid level 4.6cm unable to discern abscess vs segment of colon.  Leukocytosis resolved.  On Zosyn . pattient started on TPN.  Management per  primary service.  Hyperkalemia mild will give a dose of Lokelma , it can be managed with adjusting TPN   Chest pain  7/6 complained of chest pressure. Trop x2 flat @ 22. EKG non ischemic. CXR with no acute findings.  Monitor on telemetry.   AKI on CKD stage IIIa: Resolved creatinine 0.84  Acute Thrombocytopenia resolved  Type 2 DM Hypoglycemia: Resolved on TPN   Hypothyroidism  - con't synthroid  75mcg daily   Hyperlipidemia  HTN - con't zetia  10mg  daily  - hydralazine  prn for SBP > 180   Mild hyponatremia: Sodium 131 being corrected in the TPN improved   Respiratory insufficiency: Patient on 2 L of oxygen saturating 96% chest x-ray shows evidence of atelectasis no consolidation.   Echocardiogram  normal ejection fraction as below.  She received a dose of Lasix  40 mg x 1 yesterday.  Chest xray- 7/10  Interval placement of right arm PICC as described. No pneumothorax.Persistent low lung volumes with mild bibasilar atelectasis.  Echo 7/10- Left ventricular ejection fraction, by estimation, is 65 to 70%. The  left ventricle has normal function. The left ventricle has no regional  wall motion abnormalities. There is mild concentric left ventricular  hypertrophy. Left ventricular diastolic  parameters are consistent with Grade I diastolic dysfunction (impaired  relaxation).  Right ventricular systolic function is normal. The right ventricular  size is normal. The mitral valve is normal in structure. No evidence of mitral valve  regurgitation. No evidence of mitral stenosis.  The aortic valve is tricuspid. There is mild calcification of the  aortic valve. Aortic valve regurgitation is not visualized. Aortic valve  sclerosis/calcification is present, without any  evidence of aortic  stenosis.   Nutrition Problem: Severe Malnutrition Etiology: acute illness  Signs/Symptoms: energy intake < or equal to 50% for > or equal to 5 days, mild muscle depletion, percent weight loss (10% in 1.5  months) Percent weight loss: 10 % (in 1.5 months)    Interventions: Refer to RD note for recommendations  Estimated body mass index is 28 kg/m as calculated from the following:   Height as of this encounter: 5' 4 (1.626 m).   Weight as of this encounter: 74 kg.  DVT prophylaxis:heparin  Code Status:full Family Communication: none Disposition Plan:  Status is: Inpatient    Subjective:  No overnight events reported by the night staff patient resting in bed eyes open did not sleep well but able to follow commands and answer questions appropriately  Objective: Vitals:   03/11/24 0609 03/11/24 2022 03/12/24 0412 03/12/24 0508  BP: (!) 167/72 (!) 156/80  129/68  Pulse: 99 97  96  Resp:  20  20  Temp: 98.3 F (36.8 C) 98.1 F (36.7 C)  98.1 F (36.7 C)  TempSrc: Oral Oral  Oral  SpO2: 100% 93%  94%  Weight:   74 kg   Height:        Intake/Output Summary (Last 24 hours) at 03/12/2024 1234 Last data filed at 03/12/2024 0700 Gross per 24 hour  Intake 2036.7 ml  Output 1935 ml  Net 101.7 ml   Filed Weights   03/10/24 0500 03/11/24 0500 03/12/24 0412  Weight: 80.4 kg 81.9 kg 74 kg    Examination:  General exam: Appears calm and comfortable  Respiratory system: Clear to auscultation. Respiratory effort normal. Cardiovascular system: S1 & S2 heard, RRR. No JVD, murmurs, rubs, gallops or clicks. No pedal edema. Gastrointestinal system: Abdomen is distended colostomy bag with loose stool, JP drain, Foley catheter in place  Central nervous system: Awake. Extremities:no edema  Data Reviewed: I have personally reviewed following labs and imaging studies  CBC: Recent Labs  Lab 03/08/24 0353 03/09/24 0535 03/10/24 0600 03/11/24 0549 03/12/24 0546  WBC 6.8 5.6 5.6 6.4 8.2  HGB 10.6* 9.7* 9.9* 9.5* 8.8*  HCT 30.9* 27.7* 29.0* 27.7* 26.3*  MCV 87.0 87.9 87.6 88.2 88.9  PLT 65* 86* 118* 166 241   Basic Metabolic Panel: Recent Labs  Lab 03/07/24 0359 03/08/24 0353  03/09/24 0535 03/10/24 0600 03/11/24 0549 03/12/24 0546  NA 135 132* 127* 129* 128* 131*  K 3.4* 3.3* 4.6 4.7 4.7 5.2*  CL 105 101 98 98 99 100  CO2 22 24 22 24  21* 22  GLUCOSE 153* 138* 111* 137* 170* 177*  BUN 16 21 19 23 23  26*  CREATININE 0.83 0.95 0.96 1.01* 0.84 0.96  CALCIUM  7.8* 7.5* 7.2* 7.9* 8.3* 8.9  MG 1.6* 2.1 1.8 1.7  --  1.7  PHOS 1.2* 2.3* 3.5 3.7  --  3.5   GFR: Estimated Creatinine Clearance: 49.1 mL/min (by C-G formula based on SCr of 0.96 mg/dL). Liver Function Tests: Recent Labs  Lab 03/07/24 0359 03/09/24 0535  AST 52* 59*  ALT 76* 54*  ALKPHOS 123 99  BILITOT 1.6* 1.2  PROT 4.8* 4.8*  ALBUMIN  2.1* 1.9*   No results for input(s): LIPASE, AMYLASE in the last 168 hours. No results for input(s): AMMONIA in the last 168 hours. Coagulation Profile: No results for input(s): INR, PROTIME in the last 168 hours. Cardiac Enzymes: No results for input(s): CKTOTAL, CKMB, CKMBINDEX, TROPONINI in the last 168 hours. BNP (last 3 results)  No results for input(s): PROBNP in the last 8760 hours. HbA1C: No results for input(s): HGBA1C in the last 72 hours. CBG: Recent Labs  Lab 03/11/24 2025 03/11/24 2315 03/12/24 0349 03/12/24 0726 03/12/24 1123  GLUCAP 190* 193* 205* 183* 200*   Lipid Profile: No results for input(s): CHOL, HDL, LDLCALC, TRIG, CHOLHDL, LDLDIRECT in the last 72 hours. Thyroid  Function Tests: No results for input(s): TSH, T4TOTAL, FREET4, T3FREE, THYROIDAB in the last 72 hours. Anemia Panel: No results for input(s): VITAMINB12, FOLATE, FERRITIN, TIBC, IRON, RETICCTPCT in the last 72 hours. Sepsis Labs: No results for input(s): PROCALCITON, LATICACIDVEN in the last 168 hours.  Recent Results (from the past 240 hours)  Urine Culture     Status: None   Collection Time: 03/06/24  4:37 PM   Specimen: Urine, Catheterized  Result Value Ref Range Status   Specimen Description    Final    URINE, CATHETERIZED Performed at [facility], [address] [place], [place] [address]    Special Requests   Final    NONE Performed at [facility], [address], [place], [place] [address]    Culture   Final    NO GROWTH Performed at [facility], [address], [place], [place] [address]    Report Status 03/07/2024 FINAL  Final  Aerobic/Anaerobic Culture w Gram Stain (surgical/deep wound)     Status: None (Preliminary result)   Collection Time: 03/09/24 10:22 AM   Specimen: JP Drain  Result Value Ref Range Status   Specimen Description   Final    JP DRAINAGE Performed at [facility], [address] [place], [place] [address]    Special Requests   Final    NONE Performed at [facility], [address], [place], [place] [address]    Gram Stain NO WBC SEEN RARE GRAM POSITIVE COCCI   Final   Culture   Final    FEW ESCHERICHIA COLI FEW ENTEROCOCCUS SPECIES SUSCEPTIBILITIES TO FOLLOW Performed at [facility], [address] [place], [place] [address]    Report Status PENDING  Incomplete         Radiology Studies: No results found.   Scheduled Meds:  amoxicillin -clavulanate  1 tablet Oral Q12H   buPROPion   150 mg Oral QPM   Chlorhexidine  Gluconate Cloth  6 each Topical Daily   ezetimibe   10 mg Oral Daily   feeding supplement  1 Container Oral TID BM   gabapentin   300 mg Oral TID   heparin  injection (subcutaneous)  5,000 Units Subcutaneous Q8H   insulin  aspart  0-15 Units Subcutaneous Q4H   levothyroxine   75 mcg Oral QAC breakfast   lidocaine   1 patch Transdermal Q24H   melatonin  3 mg Oral QHS   multivitamin with minerals  1 tablet Oral Daily   pantoprazole  (PROTONIX ) IV  40 mg Intravenous Q24H   polyethylene glycol  17 g Oral Daily   sodium chloride  flush  10-40 mL Intracatheter Q12H   sodium chloride  flush  10-40 mL Intracatheter Q12H   Continuous  Infusions:  TPN ADULT (ION) 75 mL/hr at 03/12/24 0400   TPN ADULT (ION)       LOS: 12 days    Time spent: 35 min  Almarie KANDICE Hoots, MD  03/12/2024, 12:34 PM

## 2024-03-12 NOTE — Progress Notes (Signed)
 PHARMACY - TOTAL PARENTERAL NUTRITION CONSULT NOTE   Indication: Prolonged ileus  Patient Measurements: Height: 5' 4 (162.6 cm) Weight: 74 kg (163 lb 2.3 oz) IBW/kg (Calculated) : 54.7 TPN AdjBW (KG): 60.8 Body mass index is 28 kg/m.  Assessment: 78 year old female s/p hysterectomy with D&C on 7/1. Concern for postoperative bowel perforation. Pt back to OR on 7/2 for ex lap with colectomy and ostomy creation. Pharmacy consulted for TPN.  Glucose / Insulin : pt on Januvia per PTA med list -CBGs have ranged 160-200,   goal < 150 -17u SSI in 24 hrs Electrolytes:  Lytes WNL exc Na low at 131. K+ on high end of normal at 5.2 CoCa is slightly high at  at ~10.6 Renal: Scr stable Hepatic: LFTs slightly elevated; alb 1.9 Intake / Output; MIVF:  -UOP likely not fully charted  -Drain down to minimal output GI Imaging: -7/6 CT A/P 4.6 cm diameter air-fluid level in the anterior pelvis in or adjacent to the sigmoid colon; this may represent an abscess or an air-fluid level within a dilated segment of colon GI Surgeries / Procedures:  -7/2 ex lap, colectomy w/ colostomy -7/1 hysterectomy  Central access: PICC TPN start date: 7/7  Nutritional Goals: Goal TPN rate is 75 mL/hr (provides 104 g of protein and 1778 kcals per day)  RD Assessment: Estimated Needs Total Energy Estimated Needs: 1750-1900 kcals Total Protein Estimated Needs: 90-110 grams Total Fluid Estimated Needs: >/= 1.8L  Current Nutrition:  CLD starting 7/10  Plan:  Continue TPN at goal 75 mL/hr at 1800 Electrolytes in TPN: Na 120 mEq/L, K dec down to 20 mEq/L, Ca dec down to 2 mEq/L, Mg 5 mEq/L, and Phos 20 mmol/L. Cl:Ac 1:1  Advancing diet per GYN to carb modified. If tolerates well could consider weaning off TPN    Continue multivitamin to PO Continue moderate SSI q4h Thiamine  x 6 days for refeeding risk completed 7/12 Monitor TPN labs on Mon/Thurs, daily while monitoring for refeeding and advancing to  goal    Dolphus Roller, PharmD, BCPS 03/12/2024 11:55 AM

## 2024-03-13 ENCOUNTER — Telehealth: Payer: Self-pay

## 2024-03-13 DIAGNOSIS — N950 Postmenopausal bleeding: Secondary | ICD-10-CM | POA: Diagnosis not present

## 2024-03-13 LAB — MAGNESIUM: Magnesium: 1.8 mg/dL (ref 1.7–2.4)

## 2024-03-13 LAB — COMPREHENSIVE METABOLIC PANEL WITH GFR
ALT: 118 U/L — ABNORMAL HIGH (ref 0–44)
AST: 90 U/L — ABNORMAL HIGH (ref 15–41)
Albumin: 1.8 g/dL — ABNORMAL LOW (ref 3.5–5.0)
Alkaline Phosphatase: 144 U/L — ABNORMAL HIGH (ref 38–126)
Anion gap: 11 (ref 5–15)
BUN: 27 mg/dL — ABNORMAL HIGH (ref 8–23)
CO2: 22 mmol/L (ref 22–32)
Calcium: 8.8 mg/dL — ABNORMAL LOW (ref 8.9–10.3)
Chloride: 97 mmol/L — ABNORMAL LOW (ref 98–111)
Creatinine, Ser: 0.8 mg/dL (ref 0.44–1.00)
GFR, Estimated: >60 mL/min (ref >60)
Glucose, Bld: 218 mg/dL — ABNORMAL HIGH (ref 70–99)
Potassium: 4.7 mmol/L (ref 3.5–5.1)
Sodium: 130 mmol/L — ABNORMAL LOW (ref 135–145)
Total Bilirubin: 0.5 mg/dL (ref 0.0–1.2)
Total Protein: 5.6 g/dL — ABNORMAL LOW (ref 6.5–8.1)

## 2024-03-13 LAB — CBC
HCT: 27 % — ABNORMAL LOW (ref 36.0–46.0)
Hemoglobin: 9 g/dL — ABNORMAL LOW (ref 12.0–15.0)
MCH: 29.8 pg (ref 26.0–34.0)
MCHC: 33.3 g/dL (ref 30.0–36.0)
MCV: 89.4 fL (ref 80.0–100.0)
Platelets: 331 K/uL (ref 150–400)
RBC: 3.02 MIL/uL — ABNORMAL LOW (ref 3.87–5.11)
RDW: 16.8 % — ABNORMAL HIGH (ref 11.5–15.5)
WBC: 8.5 K/uL (ref 4.0–10.5)
nRBC: 0 % (ref 0.0–0.2)

## 2024-03-13 LAB — TRIGLYCERIDES: Triglycerides: 282 mg/dL — ABNORMAL HIGH (ref <150)

## 2024-03-13 LAB — GLUCOSE, CAPILLARY
Glucose-Capillary: 193 mg/dL — ABNORMAL HIGH (ref 70–99)
Glucose-Capillary: 211 mg/dL — ABNORMAL HIGH (ref 70–99)
Glucose-Capillary: 229 mg/dL — ABNORMAL HIGH (ref 70–99)
Glucose-Capillary: 237 mg/dL — ABNORMAL HIGH (ref 70–99)
Glucose-Capillary: 263 mg/dL — ABNORMAL HIGH (ref 70–99)
Glucose-Capillary: 284 mg/dL — ABNORMAL HIGH (ref 70–99)

## 2024-03-13 LAB — PHOSPHORUS: Phosphorus: 3.8 mg/dL (ref 2.5–4.6)

## 2024-03-13 MED ORDER — ENSURE PLUS HIGH PROTEIN PO LIQD
237.0000 mL | Freq: Three times a day (TID) | ORAL | Status: DC
  Administered 2024-03-13 – 2024-03-16 (×8): 237 mL via ORAL

## 2024-03-13 MED ORDER — PANTOPRAZOLE SODIUM 40 MG PO TBEC
40.0000 mg | DELAYED_RELEASE_TABLET | Freq: Every day | ORAL | Status: DC
  Administered 2024-03-13 – 2024-04-03 (×22): 40 mg via ORAL
  Filled 2024-03-13 (×22): qty 1

## 2024-03-13 MED ORDER — TRAVASOL 10 % IV SOLN
INTRAVENOUS | Status: AC
  Filled 2024-03-13: qty 696

## 2024-03-13 MED ORDER — SODIUM CHLORIDE 0.9 % IV SOLN
2.0000 g | Freq: Three times a day (TID) | INTRAVENOUS | Status: DC
  Administered 2024-03-13 – 2024-03-14 (×3): 2 g via INTRAVENOUS
  Filled 2024-03-13 (×3): qty 12.5

## 2024-03-13 MED ORDER — LINEZOLID 600 MG/300ML IV SOLN
600.0000 mg | Freq: Two times a day (BID) | INTRAVENOUS | Status: DC
  Administered 2024-03-13 (×2): 600 mg via INTRAVENOUS
  Filled 2024-03-13 (×3): qty 300

## 2024-03-13 MED ORDER — LINAGLIPTIN 5 MG PO TABS
5.0000 mg | ORAL_TABLET | Freq: Every day | ORAL | Status: DC
  Administered 2024-03-13 – 2024-04-04 (×23): 5 mg via ORAL
  Filled 2024-03-13 (×23): qty 1

## 2024-03-13 MED ORDER — ENOXAPARIN SODIUM 40 MG/0.4ML IJ SOSY
40.0000 mg | PREFILLED_SYRINGE | INTRAMUSCULAR | Status: DC
  Administered 2024-03-13 – 2024-03-22 (×10): 40 mg via SUBCUTANEOUS
  Filled 2024-03-13 (×10): qty 0.4

## 2024-03-13 NOTE — Plan of Care (Signed)
°  Problem: Clinical Measurements: °Goal: Respiratory complications will improve °Outcome: Progressing °Goal: Cardiovascular complication will be avoided °Outcome: Progressing °  °Problem: Elimination: °Goal: Will not experience complications related to urinary retention °Outcome: Progressing °  °Problem: Safety: °Goal: Ability to remain free from injury will improve °Outcome: Progressing °  °

## 2024-03-13 NOTE — Progress Notes (Signed)
 12 Days Post-Op Procedure(s) (LRB): LAPAROTOMY, EXPLORATORY (N/A) SIGMOIDOSCOPY COLECTOMY, WITH COLOSTOMY CREATION MOBILIZATION, SPLENIC FLEXURE, WITH PARTIAL COLECTOMY CYSTOSCOPY  Subjective: Patient reports decreased appetite. Currently has had one small bite of sausage on breakfast tray. Still having abdominal pain, moderate at times. Denies nausea or emesis. Has not been out of bed recently.  Objective: Vital signs in last 24 hours: Temp:  [98.5 F (36.9 C)-99.1 F (37.3 C)] 98.5 F (36.9 C) (07/14 0525) Pulse Rate:  [88-97] 88 (07/14 0525) Resp:  [18] 18 (07/14 0525) BP: (140-143)/(63) 143/63 (07/14 0525) SpO2:  [93 %-96 %] 96 % (07/14 0525) Last BM Date : 03/12/24  Intake/Output from previous day: 07/13 0701 - 07/14 0700 In: 1303.7 [P.O.:365; I.V.:928.7] Out: 4015 [Urine:3730; Drains:185; Stool:100]  Physical Examination: General: Alert, oriented, no acute distress Respiratory: Normal work of breathing. Lungs clear to auscultation bilaterally without rhonchi. Cardiovascular: Regular sinus rhythm and rate, no murmurs or rubs. Abdomen: soft, nondistended, + BS, tender to palpation diffusely (stable). Incision: currently undergoing dressing change. Midline abdominal incision with cloudy tan drainage from upper aspect, 2 tunneling areas identified. See media for photo. JP drain with clear sanguineous fluid in tubing. Ostomy with current bag change taking place. Stool present from stoma. See media for updated photo of stoma.   Extremities: extremities warm and well perfused with trace edema, SCDs in place Gu: Purewick in place with clear yellow urine  Labs:    Latest Ref Rng & Units 03/12/2024    5:46 AM 03/11/2024    5:49 AM 03/10/2024    6:00 AM  CBC  WBC 4.0 - 10.5 K/uL 8.2  6.4  5.6   Hemoglobin 12.0 - 15.0 g/dL 8.8  9.5  9.9   Hematocrit 36.0 - 46.0 % 26.3  27.7  29.0   Platelets 150 - 400 K/uL 241  166  118       Latest Ref Rng & Units 03/13/2024    5:18 AM  03/12/2024    5:46 AM 03/11/2024    5:49 AM  BMP  Glucose 70 - 99 mg/dL 781  822  829   BUN 8 - 23 mg/dL 27  26  23    Creatinine 0.44 - 1.00 mg/dL 9.19  9.03  9.15   Sodium 135 - 145 mmol/L 130  131  128   Potassium 3.5 - 5.1 mmol/L 4.7  5.2  4.7   Chloride 98 - 111 mmol/L 97  100  99   CO2 22 - 32 mmol/L 22  22  21    Calcium  8.9 - 10.3 mg/dL 8.8  8.9  8.3    Assessment: 78 y.o. s/p Procedure(s): LAPAROTOMY, EXPLORATORY SIGMOIDOSCOPY COLECTOMY, WITH COLOSTOMY CREATION MOBILIZATION, SPLENIC FLEXURE, WITH PARTIAL COLECTOMY CYSTOSCOPY  Post-Op: Overall working on post-op milestones. Improved bowel function, continue charting ostomy output. Working on increasing PO intake with plan to wean TPN once intake improves. Discussed importance of getting up at least once a day, even if to sit on side of bed or stand. Excellent UOP.   Leukocytosis: resolved. Patient transitioned from Zosyn  to Augmentin  now that she is tolerating PO for planned 14 day total course. Purulent drainage from around JP drain site - culture with few e coli and enterococcus - susceptibilities pending. Afebrile. With [facility] dressing change today, purulent drainage noted coming from the anterior aspect of the incision-culture obtained. Purulent drainage noted in VAC tubing and canister. See media for photos.    Malnutrition: on TPN given prolonged period of NPO. Will work on  weaning TPN as PO intake improves. Given poor PO intake over last 24 hours, will continue TPN at current rate. Albumin  2.8 early during this admission. Continue nutritional supplements.   Hyponatremia: asymptomatic, improving.   Acute on chronic anemia: asymptomatic, continue to monitor.   Type 2 diabetes mellitus: Continue insulin  sliding scale.  CBG (last 3)  Recent Labs    03/13/24 0040 03/13/24 0527 03/13/24 0718  GLUCAP 193* 229* 211*      Prophylaxis: SCDs, heparin  - given improved kidney function, will transition to daily lovenox .    Physical deconditioning: PT ordered. Up and out of bed as able. Plan for SNF when medically stable for discharge.    LOS: 13 days    Alexandria Durham 03/13/2024, 9:20 AM

## 2024-03-13 NOTE — Telephone Encounter (Signed)
 Rescheduled patient visit from 7/21 to 7/28.. due to provider having a meeting and patient still being hopsitalized.

## 2024-03-13 NOTE — TOC Progression Note (Signed)
 Transition of Care [facility]) - Progression Note    Patient Details  Name: Alexandria Durham MRN: [id number] Date of Birth: [birth year]  Transition of [facility] CM/SW Contact  [name], RN Phone Number:[phone]  03/13/2024, 2:00 PM  Clinical Narrative:    TOC following for medical appropriateness to initiate SNF workup.    Expected Discharge Plan: Home/Self Care Barriers to Discharge: Continued Medical Work up  Expected Discharge Plan and Services In-house Referral: NA Discharge Planning Services: CM Consult Post Acute Care Choice: NA Living arrangements for the past 2 months: Single Family Home                 DME Arranged: N/A DME Agency: NA       HH Arranged: NA HH Agency: NA         Social Determinants of Health (SDOH) Interventions SDOH Screenings   Food Insecurity: No Food Insecurity (02/29/2024)  Housing: High Risk (02/29/2024)  Transportation Needs: No Transportation Needs (02/29/2024)  Utilities: Not At Risk (02/29/2024)  Recent Concern: Utilities - At Risk (02/09/2024)  Social Connections: Patient Declined (02/29/2024)  Tobacco Use: Medium Risk (03/01/2024)    Readmission Risk Interventions    03/02/2024    3:05 PM  Readmission Risk Prevention Plan  Transportation Screening Complete  PCP or Specialist Appt within 3-5 Days Complete  HRI or Home Care Consult Complete  Social Work Consult for Recovery Care Planning/Counseling Complete  Palliative Care Screening Not Applicable  Medication Review Oceanographer) Complete

## 2024-03-13 NOTE — Plan of Care (Signed)
  Problem: Clinical Measurements: Goal: Ability to maintain clinical measurements within normal limits will improve Outcome: Progressing Goal: Will remain free from infection Outcome: Progressing Goal: Diagnostic test results will improve Outcome: Progressing Goal: Respiratory complications will improve Outcome: Progressing Goal: Cardiovascular complication will be avoided Outcome: Progressing   Problem: Activity: Goal: Risk for activity intolerance will decrease Outcome: Progressing   Problem: Coping: Goal: Level of anxiety will decrease Outcome: Progressing   Problem: Elimination: Goal: Will not experience complications related to bowel motility Outcome: Progressing Goal: Will not experience complications related to urinary retention Outcome: Progressing   Problem: Pain Managment: Goal: General experience of comfort will improve and/or be controlled Outcome: Progressing   Problem: Safety: Goal: Ability to remain free from injury will improve Outcome: Progressing   Problem: Skin Integrity: Goal: Risk for impaired skin integrity will decrease Outcome: Progressing   Problem: Metabolic: Goal: Ability to maintain appropriate glucose levels will improve Outcome: Progressing   Problem: Nutritional: Goal: Maintenance of adequate nutrition will improve Outcome: Progressing Goal: Progress toward achieving an optimal weight will improve Outcome: Progressing   Problem: Skin Integrity: Goal: Risk for impaired skin integrity will decrease Outcome: Progressing   Problem: Self-Concept: Goal: Communication of feelings regarding changes in body function or appearance will improve Outcome: Progressing   Problem: Activity: Goal: Ability to tolerate increased activity will improve Outcome: Progressing   Problem: Skin Integrity: Goal: Demonstration of wound healing without infection will improve Outcome: Progressing   Problem: Respiratory: Goal: Ability to maintain a  clear airway and adequate ventilation will improve Outcome: Progressing   Problem: Role Relationship: Goal: Method of communication will improve Outcome: Progressing

## 2024-03-13 NOTE — Progress Notes (Signed)
 PHARMACY - TOTAL PARENTERAL NUTRITION CONSULT NOTE   Indication: Prolonged ileus  Patient Measurements: Height: 5' 4 (162.6 cm) Weight: 74 kg (163 lb 2.3 oz) IBW/kg (Calculated) : 54.7 TPN AdjBW (KG): 60.8 Body mass index is 28 kg/m.  Assessment: 78 year old female s/p hysterectomy with D&C on 7/1. 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 Taken back to the OR 7/2 for ex-lap and partial colectomy and ileostomy.  Pharmacy consulted for TPN 7/7.  Glucose / Insulin : Januvia per PTA med list -CBGs have ranged 183-234,  goal < 150 -24u SSI in 24 hrs  Electrolytes:  Lytes WNL exc Na low at 130. K+ down to 4.7 (s/p Lokelma ). Cl low, CoCa 10.56  Renal: Scr stable  Hepatic: Alkphos 99>>144 up, AST 59>90 up, ALT 54>118 up, Tbili 1.2>0.5 down  Intake / Output; MIVF:  - PO: 365, Diet Carb Modified Fluid consistency: Thin, Resource Breeze x 2 -UOP: -Drains down to 185 -Stool: 100 (LBM 7/13) on daily Miralax   GI Imaging: -7/6 CT A/P 4.6 cm diameter air-fluid level in the anterior pelvis in or adjacent to the sigmoid colon; this may represent an abscess or an air-fluid level within a dilated segment of colon  GI Surgeries / Procedures:  -7/2 ex lap, colectomy w/ colostomy -7/1 hysterectomy  Central access: PICC TPN start date: 7/7  Nutritional Goals: Goal TPN rate is 75 mL/hr (provides 104 g of protein and 1778 kcals per day)  RD Assessment: Estimated Needs Total Energy Estimated Needs: 1750-1900 kcals Total Protein Estimated Needs: 90-110 grams Total Fluid Estimated Needs: >/= 1.8L  Current Nutrition:  7/10: CLD  7/11: Diet Carb Modified Fluid consistency: Thin   Plan:  Dr. [name] ok to decrease TPN to 98ml/hr Electrolytes in TPN: Na 150 (max) mEq/L, K 20 mEq/L, Ca 0, Mg 5 mEq/L, and Phos 20 mmol/L. Cl:Ac 2:1 Continue multivitamin to PO Continue moderate SSI q4h - 7/14: Resume home SGLT2 inhibitor for better glucose  control. Thiamine  x 6 days completed 7/12 Monitor TPN labs on Mon/Thurs and PRN   Carmichael Burdette Karoline Marina, PharmD, BCPS Clinical Staff Pharmacist 03/13/2024 8:24 AM

## 2024-03-13 NOTE — Plan of Care (Signed)
  Problem: Coping: Goal: Ability to adjust to condition or change in health will improve Outcome: Progressing   Problem: Metabolic: Goal: Ability to maintain appropriate glucose levels will improve Outcome: Progressing   Problem: Nutritional: Goal: Maintenance of adequate nutrition will improve Outcome: Progressing   Problem: Skin Integrity: Goal: Risk for impaired skin integrity will decrease Outcome: Progressing   Problem: Tissue Perfusion: Goal: Adequacy of tissue perfusion will improve Outcome: Progressing   Problem: Skin Integrity: Goal: Demonstration of wound healing without infection will improve Outcome: Progressing   Problem: Activity: Goal: Ability to tolerate increased activity will improve Outcome: Progressing

## 2024-03-13 NOTE — Consult Note (Addendum)
  WOC Nurse Wound Consult Note: PA at bedside to assess wound and stoma appearance.  Vac change to full thickness post-op wound to midline abd, 150cc thick cloudy tan drainage in the previous cannister.  New cannister applied. Midline abd full thickness post-op wound is beefy red, undermining/tunneling area to 3:00 o'clock with some tan drainage, depth is 5 cm when probed with a swab, no odor. Pt was medicated for pain prior to the procedure and tolerated with mod amt discomfort. Applied one piece black foam to cont suction. Applied barrier ring to lower edges to attempt to maintain a seal.  WOC team will plan to change dressing again on Thurs.   Discussed plan of care with surgical PA and the following orders have been provided for the bedside nurses: If Vac drainage is too thick to maintain suction and machine is reading blockage, remove Vac and apply moist gauze dressing and ABD pad and tape.    WOC Nurse ostomy follow up Applied barrier ring and one piece convex pouch to attempt to maintain a seal, Pt states she is feeling poorly and did not watch or participate in the pouch change. No family at the bedside.  Stoma  85% red. 15% yellow, flush with skin level.  1 1/2 inches. Mucutaneous separation at 9:00 o'clock to area approx .2X.2X.1cm, dark red and dry.  Applied barrier ring and one piece convex pouch.  50cc brown liquid stool in the pouch. Educational materials at the bedside. Educational materials and 3 sets of supplies at the the bedside for staff nurses' use: Use Supplies: barrier rings, [place] # [id number], convex pouches [place] # [phone] Enrolled patient in [place] Secure Start Discharge program: Yes, previously WOC team will perform pouch change again on Thurs.    Pt will need total assistance with pouch changes and emptying unless she begins to participate.   Thank-you,  Stephane Fought MSN, RN, CWOCN, [place], CNS [phone]

## 2024-03-13 NOTE — Progress Notes (Addendum)
 PROGRESS NOTE    Alexandria Durham  FMW:[id number] DOB: 02/29/1946 DOA: 02/29/2024 PCP: [name], NP   Brief Narrative:   78 y.o.  F with PMH significant for thickened endometrium, HTN, Type 2 DM, CKD stage IIIa, recent pancreatitis and acute cholecystitis s/p lap chole and D&C in May of this year who was admitted 7/1 for hysteroscopy and repeat D&C.  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 Pt was acidotic post-procedure, so decision made to leave intubated, PCCM consulted in this setting.  Significant events as below.  Patient was eventually transferred under [facility] to comanage on 03/07/2024 while GYN oncology remains the primary service.   Significant Hospital Events: Including procedures, antibiotic start and stop dates in addition to other pertinent events   7/2 admit to ICU post-op, ventilated  7/3 pt remains on low dose neo, weaned off sedation but mental status still precludes SBT 7/4 extubated and weaned off pressors 7/6: chest pain yesterday with flat trops, non-ischemic EKG. CT CAP with diverticulitis of sigmoid. Had CT head which was negative.  7/8-patient transferred under [facility] for continuation of the comanagement. Assessment & Plan:   Principal Problem:   Postmenopausal bleeding Active Problems:   Protein-calorie malnutrition, severe   S/p D&C procedure complicated by bowel perforation S/p partial colectomy and ileostomy  Septic shock, shock resolved  Presented for d&c on 7/1 c/b bowel perforation. OR 7/2 after development of septic shock, zosyn  started. Had ex lap with sigmoidoscopy, partial collection and ileostomy placement. Now off pressors. CT AP 7/6 showing sigmoid diverticulitis, air-fluid level 4.6cm unable to discern abscess vs segment of colon.  Leukocytosis resolved Culture with E coli and Enterococcus sensitivity pending  antibiotics changed to cefepime  and zyvox   patient started on TPN.  Management per primary service.  Hyperkalemia mild will give a dose of Lokelma , it can be managed with adjusting TPN Resolved k 4.7    Chest pain  7/6 complained of chest pressure. Trop x2 flat @ 22. EKG non ischemic. CXR with no acute findings.  Monitor on telemetry.   AKI on CKD stage IIIa: Resolved creatinine 0.84  Acute Thrombocytopenia resolved  Type 2 DM Hypoglycemia: Resolved on TPN   Hypothyroidism  - con't synthroid  75mcg daily    Hyperlipidemia  HTN - con't zetia  10mg  daily  - hydralazine  prn for SBP > 180   Mild hyponatremia: Sodium 131 being corrected in the TPN improved   Respiratory insufficiency: Patient on 2 L of oxygen saturating 96% chest x-ray shows evidence of atelectasis no consolidation.   Echocardiogram  normal ejection fraction as below.  She received a dose of Lasix  40 mg x 1 yesterday.  Chest xray- 7/10  Interval placement of right arm PICC as described. No pneumothorax.Persistent low lung volumes with mild bibasilar atelectasis.  Echo 7/10- Left ventricular ejection fraction, by estimation, is 65 to 70%. The  left ventricle has normal function. The left ventricle has no regional  wall motion abnormalities. There is mild concentric left ventricular  hypertrophy. Left ventricular diastolic  parameters are consistent with Grade I diastolic dysfunction (impaired  relaxation).  Right ventricular systolic function is normal. The right ventricular  size is normal. The mitral valve is normal in structure. No evidence of mitral valve  regurgitation. No evidence of mitral stenosis.  The aortic valve is tricuspid. There is mild calcification of the  aortic valve. Aortic valve regurgitation is not visualized. Aortic valve  sclerosis/calcification is present, without any evidence of aortic  stenosis.   Nutrition Problem: Severe Malnutrition Etiology: acute illness  Signs/Symptoms: energy  intake < or equal to 50% for > or equal to 5 days, mild muscle depletion, percent weight loss (10% in 1.5 months) Percent weight loss: 10 % (in 1.5 months)    Interventions: Refer to RD note for recommendations  Estimated body mass index is 28 kg/m as calculated from the following:   Height as of this encounter: 5' 4 (1.626 m).   Weight as of this encounter: 74 kg.  DVT prophylaxis:heparin  Code Status:full Family Communication: none Disposition Plan:  Status is: Inpatient    Subjective:  Awake reports abdominal pain  On TPN NO EVENTS overnight Ate very little  Objective: Vitals:   03/12/24 0412 03/12/24 0508 03/12/24 2010 03/13/24 0525  BP:  129/68 (!) 140/63 (!) 143/63  Pulse:  96 97 88  Resp:  20 18 18   Temp:  98.1 F (36.7 C) 99.1 F (37.3 C) 98.5 F (36.9 C)  TempSrc:  Oral Oral Oral  SpO2:  94% 93% 96%  Weight: 74 kg     Height:        Intake/Output Summary (Last 24 hours) at 03/13/2024 0954 Last data filed at 03/13/2024 0900 Gross per 24 hour  Intake 1298.65 ml  Output 4215 ml  Net -2916.35 ml   Filed Weights   03/10/24 0500 03/11/24 0500 03/12/24 0412  Weight: 80.4 kg 81.9 kg 74 kg    Examination:  General exam: Appears in nad Respiratory system: Clear to auscultation. Respiratory effort normal. Cardiovascular system: S1 & S2 heard, RRR. No JVD, murmurs, rubs, gallops or clicks. No pedal edema. Gastrointestinal system: Abdomen is distended colostomy bag with loose stool, JP drain, Foley catheter in place  Central nervous system: Awake. Extremities:no edema  Data Reviewed: I have personally reviewed following labs and imaging studies  CBC: Recent Labs  Lab 03/08/24 0353 03/09/24 0535 03/10/24 0600 03/11/24 0549 03/12/24 0546  WBC 6.8 5.6 5.6 6.4 8.2  HGB 10.6* 9.7* 9.9* 9.5* 8.8*  HCT 30.9* 27.7* 29.0* 27.7* 26.3*  MCV 87.0 87.9 87.6 88.2 88.9  PLT 65* 86* 118* 166 241   Basic Metabolic Panel: Recent Labs  Lab 03/08/24 0353  03/09/24 0535 03/10/24 0600 03/11/24 0549 03/12/24 0546 03/13/24 0518  NA 132* 127* 129* 128* 131* 130*  K 3.3* 4.6 4.7 4.7 5.2* 4.7  CL 101 98 98 99 100 97*  CO2 24 22 24  21* 22 22  GLUCOSE 138* 111* 137* 170* 177* 218*  BUN 21 19 23 23  26* 27*  CREATININE 0.95 0.96 1.01* 0.84 0.96 0.80  CALCIUM  7.5* 7.2* 7.9* 8.3* 8.9 8.8*  MG 2.1 1.8 1.7  --  1.7 1.8  PHOS 2.3* 3.5 3.7  --  3.5 3.8   GFR: Estimated Creatinine Clearance: 58.9 mL/min (by C-G formula based on SCr of 0.8 mg/dL). Liver Function Tests: Recent Labs  Lab 03/07/24 0359 03/09/24 0535 03/13/24 0518  AST 52* 59* 90*  ALT 76* 54* 118*  ALKPHOS 123 99 144*  BILITOT 1.6* 1.2 0.5  PROT 4.8* 4.8* 5.6*  ALBUMIN  2.1* 1.9* 1.8*   No results for input(s): LIPASE, AMYLASE in the last 168 hours. No results for input(s): AMMONIA in the last 168 hours. Coagulation Profile: No results for input(s): INR, PROTIME in the last 168 hours. Cardiac Enzymes: No results for input(s): CKTOTAL, CKMB, CKMBINDEX, TROPONINI in  the last 168 hours. BNP (last 3 results) No results for input(s): PROBNP in the last 8760 hours. HbA1C: No results for input(s): HGBA1C in the last 72 hours. CBG: Recent Labs  Lab 03/12/24 1616 03/12/24 2012 03/13/24 0040 03/13/24 0527 03/13/24 0718  GLUCAP 215* 234* 193* 229* 211*   Lipid Profile: Recent Labs    03/13/24 0519  TRIG 282*   Thyroid  Function Tests: No results for input(s): TSH, T4TOTAL, FREET4, T3FREE, THYROIDAB in the last 72 hours. Anemia Panel: No results for input(s): VITAMINB12, FOLATE, FERRITIN, TIBC, IRON, RETICCTPCT in the last 72 hours. Sepsis Labs: No results for input(s): PROCALCITON, LATICACIDVEN in the last 168 hours.  Recent Results (from the past 240 hours)  Urine Culture     Status: None   Collection Time: 03/06/24  4:37 PM   Specimen: Urine, Catheterized  Result Value Ref Range Status   Specimen Description   Final     URINE, CATHETERIZED Performed at [facility], [address] [place], [place] [address]    Special Requests   Final    NONE Performed at [facility], [address] [place], [place] [address]    Culture   Final    NO GROWTH Performed at [facility], [address], [place], [place] [address]    Report Status 03/07/2024 FINAL  Final  Aerobic/Anaerobic Culture w Gram Stain (surgical/deep wound)     Status: None (Preliminary result)   Collection Time: 03/09/24 10:22 AM   Specimen: JP Drain  Result Value Ref Range Status   Specimen Description   Final    JP DRAINAGE Performed at [facility], [address] [place], [place] [address]    Special Requests   Final    NONE Performed at [facility], [address] [place], [place] [address]    Gram Stain NO WBC SEEN RARE GRAM POSITIVE COCCI   Final   Culture   Final    FEW ESCHERICHIA COLI FEW ENTEROCOCCUS SPECIES SUSCEPTIBILITIES TO FOLLOW HOLDING FOR POSSIBLE ANAEROBE Performed at [facility], [address] [place], [place] [address]    Report Status PENDING  Incomplete         Radiology Studies: No results found.   Scheduled Meds:  amoxicillin -clavulanate  1 tablet Oral Q12H   buPROPion   150 mg Oral QPM   Chlorhexidine  Gluconate Cloth  6 each Topical Daily   enoxaparin  (LOVENOX ) injection  40 mg Subcutaneous Q24H   ezetimibe   10 mg Oral Daily   feeding supplement  1 Container Oral TID BM   gabapentin   300 mg Oral TID   insulin  aspart  0-15 Units Subcutaneous Q4H   levothyroxine   75 mcg Oral QAC breakfast   lidocaine   1 patch Transdermal Q24H   linagliptin   5 mg Oral Daily   melatonin  3 mg Oral QHS   multivitamin with minerals  1 tablet Oral Daily   pantoprazole   40 mg Oral QHS   polyethylene glycol  17 g Oral Daily   sodium chloride  flush  10-40 mL Intracatheter Q12H   sodium chloride  flush  10-40 mL  Intracatheter Q12H   Continuous Infusions:  TPN ADULT (ION) 75 mL/hr at 03/12/24 1740   TPN ADULT (ION)       LOS: 13 days    Time spent: 35 min  Almarie KANDICE Hoots, MD  03/13/2024, 9:54 AM

## 2024-03-14 DIAGNOSIS — N950 Postmenopausal bleeding: Secondary | ICD-10-CM | POA: Diagnosis not present

## 2024-03-14 LAB — GLUCOSE, CAPILLARY
Glucose-Capillary: 152 mg/dL — ABNORMAL HIGH (ref 70–99)
Glucose-Capillary: 198 mg/dL — ABNORMAL HIGH (ref 70–99)
Glucose-Capillary: 200 mg/dL — ABNORMAL HIGH (ref 70–99)
Glucose-Capillary: 224 mg/dL — ABNORMAL HIGH (ref 70–99)
Glucose-Capillary: 238 mg/dL — ABNORMAL HIGH (ref 70–99)
Glucose-Capillary: 271 mg/dL — ABNORMAL HIGH (ref 70–99)

## 2024-03-14 LAB — AEROBIC/ANAEROBIC CULTURE W GRAM STAIN (SURGICAL/DEEP WOUND): Gram Stain: NONE SEEN

## 2024-03-14 LAB — BASIC METABOLIC PANEL WITH GFR
Anion gap: 6 (ref 5–15)
BUN: 27 mg/dL — ABNORMAL HIGH (ref 8–23)
CO2: 23 mmol/L (ref 22–32)
Calcium: 8.1 mg/dL — ABNORMAL LOW (ref 8.9–10.3)
Chloride: 98 mmol/L (ref 98–111)
Creatinine, Ser: 0.82 mg/dL (ref 0.44–1.00)
GFR, Estimated: >60 mL/min (ref >60)
Glucose, Bld: 204 mg/dL — ABNORMAL HIGH (ref 70–99)
Potassium: 4 mmol/L (ref 3.5–5.1)
Sodium: 127 mmol/L — ABNORMAL LOW (ref 135–145)

## 2024-03-14 LAB — CBC
HCT: 24.6 % — ABNORMAL LOW (ref 36.0–46.0)
Hemoglobin: 8.2 g/dL — ABNORMAL LOW (ref 12.0–15.0)
MCH: 29.9 pg (ref 26.0–34.0)
MCHC: 33.3 g/dL (ref 30.0–36.0)
MCV: 89.8 fL (ref 80.0–100.0)
Platelets: 350 K/uL (ref 150–400)
RBC: 2.74 MIL/uL — ABNORMAL LOW (ref 3.87–5.11)
RDW: 16.6 % — ABNORMAL HIGH (ref 11.5–15.5)
WBC: 8.5 K/uL (ref 4.0–10.5)
nRBC: 0.2 % (ref 0.0–0.2)

## 2024-03-14 MED ORDER — PIPERACILLIN-TAZOBACTAM 3.375 G IVPB
3.3750 g | Freq: Three times a day (TID) | INTRAVENOUS | Status: DC
  Administered 2024-03-14 – 2024-03-16 (×7): 3.375 g via INTRAVENOUS
  Filled 2024-03-14 (×7): qty 50

## 2024-03-14 MED ORDER — LINEZOLID 600 MG PO TABS
600.0000 mg | ORAL_TABLET | Freq: Two times a day (BID) | ORAL | Status: AC
  Administered 2024-03-14 – 2024-03-26 (×26): 600 mg via ORAL
  Filled 2024-03-14 (×26): qty 1

## 2024-03-14 MED ORDER — TRAVASOL 10 % IV SOLN
INTRAVENOUS | Status: AC
  Filled 2024-03-14: qty 696

## 2024-03-14 NOTE — Progress Notes (Signed)
 13 Days Post-Op Procedure(s) (LRB): LAPAROTOMY, EXPLORATORY (N/A) SIGMOIDOSCOPY COLECTOMY, WITH COLOSTOMY CREATION MOBILIZATION, SPLENIC FLEXURE, WITH PARTIAL COLECTOMY CYSTOSCOPY  Subjective: Patient just got to chair with PT. States it feels good to be out of bed. Felt the transition to chair went well. Had an ensure this am. Did not eat solid food. Continues to have decreased appetite. No nausea or emesis. Reporting abdominal pain as moderate at this time after getting out of bed. Denies chest pain, dyspnea.  Objective: Vital signs in last 24 hours: Temp:  [98.1 F (36.7 C)-98.4 F (36.9 C)] 98.1 F (36.7 C) (07/15 0412) Pulse Rate:  [92-101] 92 (07/15 0412) Resp:  [18] 18 (07/15 0412) BP: (140-143)/(69-79) 143/79 (07/15 0412) SpO2:  [94 %-95 %] 95 % (07/15 0412) Weight:  [165 lb 12.6 oz (75.2 kg)] 165 lb 12.6 oz (75.2 kg) (07/15 0500) Last BM Date : 03/13/24  Intake/Output from previous day: 07/14 0701 - 07/15 0700 In: 2422.2 [P.O.:50; I.V.:1472.2; IV Piggyback:900] Out: 2170 [Urine:1800; Drains:220; Stool:150]  Physical Examination: General: Alert, oriented, no acute distress Respiratory: Normal work of breathing. Lungs clear to auscultation bilaterally without rhonchi. Cardiovascular: Regular sinus rhythm and rate, no murmurs or rubs. Abdomen: soft, nondistended, + BS, tender to palpation diffusely (stable). Incision: wound VAC in place to suction. See media for photo from yesterday's dressing change. JP drain with clear sanguineous fluid in tubing. No drainage noted from JP insertion site. Ostomy with stool, flatus. See media for updated photo of stoma from yesterday.   Extremities: extremities warm and well perfused with trace edema, SCDs in place Gu: Purewick in place with clear yellow urine  Labs:    Latest Ref Rng & Units 03/14/2024    5:39 AM 03/13/2024   10:40 AM 03/12/2024    5:46 AM  CBC  WBC 4.0 - 10.5 K/uL 8.5  8.5  8.2   Hemoglobin 12.0 - 15.0 g/dL 8.2  9.0   8.8   Hematocrit 36.0 - 46.0 % 24.6  27.0  26.3   Platelets 150 - 400 K/uL 350  331  241       Latest Ref Rng & Units 03/14/2024    5:39 AM 03/13/2024    5:18 AM 03/12/2024    5:46 AM  BMP  Glucose 70 - 99 mg/dL 795  781  822   BUN 8 - 23 mg/dL 27  27  26    Creatinine 0.44 - 1.00 mg/dL 9.17  9.19  9.03   Sodium 135 - 145 mmol/L 127  130  131   Potassium 3.5 - 5.1 mmol/L 4.0  4.7  5.2   Chloride 98 - 111 mmol/L 98  97  100   CO2 22 - 32 mmol/L 23  22  22    Calcium  8.9 - 10.3 mg/dL 8.1  8.8  8.9    Assessment: 78 y.o. s/p Procedure(s): LAPAROTOMY, EXPLORATORY SIGMOIDOSCOPY COLECTOMY, WITH COLOSTOMY CREATION MOBILIZATION, SPLENIC FLEXURE, WITH PARTIAL COLECTOMY CYSTOSCOPY  Post-Op: Overall working on post-op milestones. Continues to have decreased appetite. Improved bowel function, continue charting ostomy output. Working on increasing PO intake with plan to wean TPN once intake improves. Discussed importance of getting up at least once a day, even if to sit on side of bed or stand. Excellent UOP.   Leukocytosis: WBC 8.5 this am. Purulent drainage from around JP drain site - culture with few e coli and enterococcus - susceptibilities returned 03/13/24. Was started on linezolid  and cefepime  yesterday by Hospitalist Team. Per ID pharmacist, linezolid  can be  changed to oral, cefazolin  added to susceptibility testing yesterday. Afebrile. With [facility] dressing change yesterday, purulent drainage noted coming from the anterior aspect of the incision-culture obtained.    Malnutrition: on TPN given prolonged period of NPO. Will work on weaning TPN as PO intake improves. Given poor PO intake over last 24 hours, will continue TPN at current rate. Albumin  2.8 early during this admission. Continue nutritional supplements.   Hyponatremia: asymptomatic, Na+127 this am.   Acute on chronic anemia: asymptomatic, continue to monitor.   Type 2 diabetes mellitus: Continue insulin  sliding scale.  CBG (last 3)   Recent Labs    03/14/24 0007 03/14/24 0415 03/14/24 0730  GLUCAP 238* 200* 198*      Prophylaxis: SCDs, on lovenox   Physical deconditioning: PT ordered. Up and out of bed as able. Plan for SNF when medically stable for discharge.    LOS: 14 days    Alexandria Durham 03/14/2024, 9:54 AM

## 2024-03-14 NOTE — Progress Notes (Signed)
 Nutrition Follow-up  DOCUMENTATION CODES:   Severe malnutrition in context of acute illness/injury  INTERVENTION:   -TPN management per Pharmacy -Daily weights while on TPN  -Ensure Plus High Protein po TID, each supplement provides 350 kcal and 20 grams of protein.   NUTRITION DIAGNOSIS:   Severe Malnutrition related to acute illness as evidenced by energy intake < or equal to 50% for > or equal to 5 days, mild muscle depletion, percent weight loss (10% in 1.5 months).  Ongoing.  GOAL:   Patient will meet greater than or equal to 90% of their needs  Progressing.  MONITOR:   Vent status, Labs, Weight trends   ASSESSMENT:   78 y.o.  F with PMH significant for thickened endometrium, HTN, Type 2 DM, CKD stage IIIa, recent pancreatitis and acute cholecystitis s/p lap chole and D&C in May of this year who was admitted 7/1 for hysteroscopy and repeat D&C.  7/1 hysteroscopy and repeat D&C. 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 No other substantial intakes. TPN to continue at 50 ml/hr, providing 1185 kcals and 69g protein.  Admission weight: 141 lbs Current weight: 165 lbs  Medications: Multivitamin with minerals daily, Miralax ,   Labs reviewed: CBGs: 198-271 Low Na   Diet Order:   Diet Order             Diet Carb Modified Fluid consistency: Thin  Diet effective now                   EDUCATION NEEDS:   No education needs have been identified at this time  Skin:  Skin Assessment: Skin Integrity Issues: Skin Integrity Issues:: Incisions Incisions: Abdomen  Last BM:  7/15 -colostomy  Height:   Ht Readings from Last 1 Encounters:  03/06/24 5' 4 (1.626 m)    Weight:   Wt Readings from Last 1 Encounters:  03/14/24 75.2 kg     BMI:  Body mass index is 28.46 kg/m.  Estimated Nutritional Needs:   Kcal:  1750-1900 kcals  Protein:  90-110 grams  Fluid:  >/= 1.8L   Morna Lee, MS, RD, LDN Inpatient Clinical Dietitian Contact via Secure chat

## 2024-03-14 NOTE — Progress Notes (Signed)
 PROGRESS NOTE    Alexandria Durham  FMW:[id number] DOB: Oct 10, 1945 DOA: 02/29/2024 PCP: [name], NP   Brief Narrative:   78 y.o.  F with PMH significant for thickened endometrium, HTN, Type 2 DM, CKD stage IIIa, recent pancreatitis and acute cholecystitis s/p lap chole and D&C in May of this year who was admitted 7/1 for hysteroscopy and repeat D&C.  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 Pt was acidotic post-procedure, so decision made to leave intubated, PCCM consulted in this setting.  Significant events as below.  Patient was eventually transferred under [facility] to comanage on 03/07/2024 while GYN oncology remains the primary service. Significant events Patient was admitted to the ICU postop intubated on the 7/2 extubated 7/ 4 patient transferred under TRH for continuation of co management 7/8   Assessment & Plan:   Principal Problem:   Postmenopausal bleeding Active Problems:   Protein-calorie malnutrition, severe   S/p D&C procedure complicated by bowel perforation S/p partial colectomy and ileostomy  Septic shock, shock resolved  Presented for d&c on 7/1 c/b bowel perforation. OR 7/2 after development of septic shock, zosyn  started. Had ex lap with sigmoidoscopy, partial collection and ileostomy placement. Now off pressors. CT AP 7/6 showing sigmoid diverticulitis, air-fluid level 4.6cm unable to discern abscess vs segment of colon.  Leukocytosis resolved Culture with E coli and Enterococcus sensitivity pending antibiotics changed to Zosyn  and zyvox  (Zosyn  03/14/2024 and Zyvox  03/13/2024 started) patient started on TPN.  Management per primary service.  Hyperkalemia resolved after Lokelma   Chest pain  7/6 complained of chest pressure. Trop x2 flat @ 22. EKG non ischemic. CXR with no acute findings.  Monitor on telemetry.   AKI on CKD  stage IIIa: Resolved creatinine 0.84  Acute Thrombocytopenia resolved  Type 2 DM Hypoglycemia: Resolved on TPN   Hypothyroidism  - con't synthroid  75mcg daily    Hyperlipidemia  HTN - con't zetia  10mg  daily  - hydralazine  prn for SBP > 180   Mild hyponatremia: Sodium 131 being corrected in the TPN improved   Respiratory insufficiency: Patient on 2 L of oxygen saturating 96% chest x-ray shows evidence of atelectasis no consolidation.   Echocardiogram  normal ejection fraction as below.  She received a dose of Lasix  40 mg x 1 yesterday.  Chest xray- 7/10  Interval placement of right arm PICC as described. No pneumothorax.Persistent low lung volumes with mild bibasilar atelectasis.  Echo 7/10- Left ventricular ejection fraction, by estimation, is 65 to 70%. The  left ventricle has normal function. The left ventricle has no regional  wall motion abnormalities. There is mild concentric left ventricular  hypertrophy. Left ventricular diastolic  parameters are consistent with Grade I diastolic dysfunction (impaired  relaxation).  Right ventricular systolic function is normal. The right ventricular  size is normal. The mitral valve is normal in structure. No evidence of mitral valve  regurgitation. No evidence of mitral stenosis.  The aortic valve is tricuspid. There is mild calcification of the  aortic valve. Aortic valve regurgitation is not visualized. Aortic valve  sclerosis/calcification is present, without any evidence of aortic  stenosis.   Nutrition Problem: Severe Malnutrition Etiology: acute illness  Signs/Symptoms: energy intake < or equal to 50% for > or equal to 5 days, mild muscle depletion, percent weight loss (10% in 1.5 months) Percent weight loss: 10 % (in  1.5 months)    Interventions: Refer to RD note for recommendations  Estimated body mass index is 28.46 kg/m as calculated from the following:   Height as of this encounter: 5' 4 (1.626 m).   Weight as of  this encounter: 75.2 kg.  DVT prophylaxis:heparin  Code Status:full Family Communication: none Disposition Plan:  Status is: Inpatient    Subjective:  Awake more awake than yesterday complains of abdominal pain wants to sit up in chair today Objective: Vitals:   03/13/24 1303 03/13/24 2015 03/14/24 0412 03/14/24 0500  BP: (!) 143/71 (!) 140/69 (!) 143/79   Pulse: 100 (!) 101 92   Resp: 18 18 18    Temp: 98.4 F (36.9 C) 98.2 F (36.8 C) 98.1 F (36.7 C)   TempSrc: Oral Oral Oral   SpO2: 94% 94% 95%   Weight:    75.2 kg  Height:        Intake/Output Summary (Last 24 hours) at 03/14/2024 1215 Last data filed at 03/14/2024 1000 Gross per 24 hour  Intake 2382.2 ml  Output 1980 ml  Net 402.2 ml   Filed Weights   03/11/24 0500 03/12/24 0412 03/14/24 0500  Weight: 81.9 kg 74 kg 75.2 kg    Examination:  General exam: Appears in nad Respiratory system: Clear to auscultation. Respiratory effort normal. Cardiovascular system: S1 & S2 heard, RRR. No JVD, murmurs, rubs, gallops or clicks. No pedal edema. Gastrointestinal system: Abdomen is distended colostomy bag with loose stool, JP drain, Foley catheter in place  Central nervous system: Awake. Extremities:no edema  Data Reviewed: I have personally reviewed following labs and imaging studies  CBC: Recent Labs  Lab 03/10/24 0600 03/11/24 0549 03/12/24 0546 03/13/24 1040 03/14/24 0539  WBC 5.6 6.4 8.2 8.5 8.5  HGB 9.9* 9.5* 8.8* 9.0* 8.2*  HCT 29.0* 27.7* 26.3* 27.0* 24.6*  MCV 87.6 88.2 88.9 89.4 89.8  PLT 118* 166 241 331 350   Basic Metabolic Panel: Recent Labs  Lab 03/08/24 0353 03/09/24 0535 03/10/24 0600 03/11/24 0549 03/12/24 0546 03/13/24 0518 03/14/24 0539  NA 132* 127* 129* 128* 131* 130* 127*  K 3.3* 4.6 4.7 4.7 5.2* 4.7 4.0  CL 101 98 98 99 100 97* 98  CO2 24 22 24  21* 22 22 23   GLUCOSE 138* 111* 137* 170* 177* 218* 204*  BUN 21 19 23 23  26* 27* 27*  CREATININE 0.95 0.96 1.01* 0.84 0.96 0.80  0.82  CALCIUM  7.5* 7.2* 7.9* 8.3* 8.9 8.8* 8.1*  MG 2.1 1.8 1.7  --  1.7 1.8  --   PHOS 2.3* 3.5 3.7  --  3.5 3.8  --    GFR: Estimated Creatinine Clearance: 58 mL/min (by C-G formula based on SCr of 0.82 mg/dL). Liver Function Tests: Recent Labs  Lab 03/09/24 0535 03/13/24 0518  AST 59* 90*  ALT 54* 118*  ALKPHOS 99 144*  BILITOT 1.2 0.5  PROT 4.8* 5.6*  ALBUMIN  1.9* 1.8*   No results for input(s): LIPASE, AMYLASE in the last 168 hours. No results for input(s): AMMONIA in the last 168 hours. Coagulation Profile: No results for input(s): INR, PROTIME in the last 168 hours. Cardiac Enzymes: No results for input(s): CKTOTAL, CKMB, CKMBINDEX, TROPONINI in the last 168 hours. BNP (last 3 results) No results for input(s): PROBNP in the last 8760 hours. HbA1C: No results for input(s): HGBA1C in the last 72 hours. CBG: Recent Labs  Lab 03/13/24 1611 03/13/24 2018 03/14/24 0007 03/14/24 0415 03/14/24 0730  GLUCAP 284* 237* 238* 200*  198*   Lipid Profile: Recent Labs    03/13/24 0519  TRIG 282*   Thyroid  Function Tests: No results for input(s): TSH, T4TOTAL, FREET4, T3FREE, THYROIDAB in the last 72 hours. Anemia Panel: No results for input(s): VITAMINB12, FOLATE, FERRITIN, TIBC, IRON, RETICCTPCT in the last 72 hours. Sepsis Labs: No results for input(s): PROCALCITON, LATICACIDVEN in the last 168 hours.  Recent Results (from the past 240 hours)  Urine Culture     Status: None   Collection Time: 03/06/24  4:37 PM   Specimen: Urine, Catheterized  Result Value Ref Range Status   Specimen Description   Final    URINE, CATHETERIZED Performed at [facility], [address] [place], [place] [address]    Special Requests   Final    NONE Performed at [facility], [address] [place], [place] [address]    Culture   Final    NO GROWTH Performed at [facility], [address], [place], [place] [address]    Report Status 03/07/2024 FINAL  Final  Aerobic/Anaerobic Culture w Gram Stain (surgical/deep wound)     Status: None   Collection Time: 03/09/24 10:22 AM   Specimen: JP Drain  Result Value Ref Range Status   Specimen Description   Final    JP DRAINAGE Performed at [facility], [address] [place], [place] [address]    Special Requests   Final    NONE Performed at [facility], [address], [place], [place] [address]    Gram Stain NO WBC SEEN RARE GRAM POSITIVE COCCI   Final   Culture   Final    FEW ESCHERICHIA COLI RARE ESCHERICHIA COLI FEW ENTEROCOCCUS SPECIES FEW BACTEROIDES OVATUS BETA LACTAMASE POSITIVE Performed at [facility], [address] [place], [place] [address]    Report Status 03/14/2024 FINAL  Final   Organism ID, Bacteria ESCHERICHIA COLI  Final   Organism ID, Bacteria ESCHERICHIA COLI  Final   Organism ID, Bacteria ENTEROCOCCUS SPECIES  Final   Organism ID, Bacteria ESCHERICHIA COLI  Final   Organism ID, Bacteria ESCHERICHIA COLI  Final      Susceptibility   Escherichia coli - MIC*    AMPICILLIN >=32 RESISTANT Resistant     CEFEPIME  <=0.12 SENSITIVE Sensitive     CEFTAZIDIME <=1 SENSITIVE Sensitive     CEFTRIAXONE  <=0.25 SENSITIVE Sensitive     CIPROFLOXACIN >=4 RESISTANT Resistant     GENTAMICIN <=1 SENSITIVE Sensitive     IMIPENEM <=0.25 SENSITIVE Sensitive     TRIMETH/SULFA >=320 RESISTANT Resistant     AMPICILLIN/SULBACTAM 16 INTERMEDIATE Intermediate     PIP/TAZO <=4 SENSITIVE Sensitive ug/mL   Escherichia coli - MIC*    AMPICILLIN >=32 RESISTANT Resistant     CEFEPIME  <=0.12 SENSITIVE Sensitive     CEFTAZIDIME <=1 SENSITIVE Sensitive     CEFTRIAXONE  <=0.25 SENSITIVE Sensitive     CIPROFLOXACIN <=0.25 SENSITIVE Sensitive     GENTAMICIN <=1 SENSITIVE Sensitive     IMIPENEM <=0.25 SENSITIVE Sensitive     TRIMETH/SULFA <=20 SENSITIVE Sensitive      AMPICILLIN/SULBACTAM >=32 RESISTANT Resistant     PIP/TAZO <=4 SENSITIVE Sensitive ug/mL   Escherichia coli - KIRBY BAUER*    CEFAZOLIN  INTERMEDIATE Intermediate    Escherichia coli - KIRBY BAUER*    CEFAZOLIN  RESISTANT Resistant     * FEW ESCHERICHIA COLI    RARE ESCHERICHIA COLI    FEW ESCHERICHIA COLI  RARE ESCHERICHIA COLI   Enterococcus species - MIC*    AMPICILLIN 16 RESISTANT Resistant     VANCOMYCIN <=0.5 SENSITIVE Sensitive     GENTAMICIN SYNERGY SENSITIVE Sensitive     * FEW ENTEROCOCCUS SPECIES  Aerobic Culture w Gram Stain (superficial specimen)     Status: None (Preliminary result)   Collection Time: 03/13/24  2:21 PM   Specimen: Abdomen  Result Value Ref Range Status   Specimen Description   Final    ABDOMEN Performed at [facility], [address], [place], [place] [address]    Special Requests   Final    NONE Performed at [facility], [address] [place], [place] [address]    Gram Stain   Final    RARE WBC PRESENT, PREDOMINANTLY PMN RARE GRAM NEGATIVE RODS    Culture   Final    MODERATE GRAM NEGATIVE RODS CULTURE REINCUBATED FOR BETTER GROWTH Performed at [facility], [address], [place], [place] [address]    Report Status PENDING  Incomplete         Radiology Studies: No results found.   Scheduled Meds:  buPROPion   150 mg Oral QPM   Chlorhexidine  Gluconate Cloth  6 each Topical Daily   enoxaparin  (LOVENOX ) injection  40 mg Subcutaneous Q24H   ezetimibe   10 mg Oral Daily   feeding supplement  237 mL Oral TID BM   gabapentin   300 mg Oral TID   insulin  aspart  0-15 Units Subcutaneous Q4H   levothyroxine   75 mcg Oral QAC breakfast   lidocaine   1 patch Transdermal Q24H   linagliptin   5 mg Oral Daily   linezolid   600 mg Oral Q12H   melatonin  3 mg Oral QHS   multivitamin with minerals  1 tablet Oral Daily   pantoprazole   40 mg Oral QHS   polyethylene glycol  17 g Oral Daily   sodium  chloride flush  10-40 mL Intracatheter Q12H   sodium chloride  flush  10-40 mL Intracatheter Q12H   Continuous Infusions:  piperacillin -tazobactam (ZOSYN )  IV 3.375 g (03/14/24 0954)   TPN ADULT (ION) 50 mL/hr at 03/14/24 0546   TPN ADULT (ION)       LOS: 14 days    Time spent: 35 min  Almarie KANDICE Hoots, MD  03/14/2024, 12:15 PM

## 2024-03-14 NOTE — Plan of Care (Signed)
  Problem: Clinical Measurements: Goal: Respiratory complications will improve Outcome: Progressing Goal: Cardiovascular complication will be avoided Outcome: Progressing   Problem: Coping: Goal: Level of anxiety will decrease Outcome: Progressing   Problem: Elimination: Goal: Will not experience complications related to bowel motility Outcome: Progressing Goal: Will not experience complications related to urinary retention Outcome: Progressing   Problem: Pain Managment: Goal: General experience of comfort will improve and/or be controlled Outcome: Progressing

## 2024-03-14 NOTE — Progress Notes (Signed)
 Occupational Therapy Treatment Patient Details Name: Alexandria Durham MRN: [id number] DOB: 1945/09/26 Today's Date: 03/14/2024   History of present illness Patient is a 78 year old female who presented on 03/01/2024 following a D&C on 7/1 for thickened endometrium and procedure complicated due to bowel perforation. Pt developed signs and symptoms of sepsis and shock. Pt underwent exploratory-laparotomy, partial colectomy and ileostomy on 7/2.  Pt required post-op ventilation and extubated on 7/4.  PMH includes but is not limited to:  recent hospital admissions for diverticulitis, pancreatitis, abdominal pain, D&C  01/06/24 with uterine perforation, cholecystectomy and d&C on 01/17/24, DM II, HTN, COPD, GERD and CKD III.   OT comments  The pt was noted to be improved overall alertness, orientation, command follow, and attention today, as compared to prior OT session. She was very recently assisted back to bed by the nursing staff, therefore she was seen for bed level only intervention(s). She required max assist for rolling in bed. She subsequently participated in and performed upper body grooming tasks with supervision/set-up. Instruction was further provided on BUE and BLE ROM and therapeutic exercises, to facilitate improved joint flexibility and muscle strengthening needed to optimize her ADL participation. She reported having moderate abdominal pain. Good effort and participation noted. Continue OT plan of care. Patient will benefit from continued inpatient follow up therapy, <3 hours/day       If plan is discharge home, recommend the following:  Two people to help with walking and/or transfers;Assistance with cooking/housework;Assist for transportation;Help with stairs or ramp for entrance;A lot of help with bathing/dressing/bathroom   Equipment Recommendations  Other (comment) (to be determined pending progress at next level of care)    Recommendations for Other Services      Precautions /  Restrictions Precautions Precautions: Fall Other Brace: abdominal binder when out of bed Restrictions Weight Bearing Restrictions Per Provider Order: Yes Other Position/Activity Restrictions: wound vac, JP drain, ostomy, abdominal binder when out of bed       Mobility Bed Mobility Overal bed mobility: Needs Assistance Bed Mobility: Rolling Rolling: Max assist         General bed mobility comments:  (She instructed on rolling in bed, for whichs she required max assist)             ADL either performed or assessed with clinical judgement   ADL Overall ADL's : Needs assistance/impaired     Grooming: Set up;Supervision/safety;Bed level Grooming Details (indicate cue type and reason): The pt performed upper body grooming in the semi-fowler's position. Specifically, she performed face washing, teeth brushing, and applying lip moisturizer.     Lower Body Bathing: Total assistance;Bed level Lower Body Bathing Details (indicate cue type and reason): to don socks                             Communication Communication Communication: No apparent difficulties   Cognition Arousal: Alert Behavior During Therapy: WFL for tasks assessed/performed               OT - Cognition Comments: Followed 1 step commands ~80% of the time. Iproved overall alertness, attention, and cognitive clarity           Cueing   Cueing Techniques: Verbal cues, Gestural cues, Tactile cues             Pertinent Vitals/ Pain       Pain Assessment Pain Assessment: 0-10 Pain Score: 5  Pain Location: abdomen Pain  Intervention(s): Limited activity within patient's tolerance, Monitored during session, Repositioned   Frequency  Min 2X/week        Progress Toward Goals  OT Goals(current goals can now be found in the care plan section)     Acute Rehab OT Goals Patient Stated Goal: decreased pain and to get better OT Goal Formulation: With patient Time For Goal  Achievement: 03/22/24 Potential to Achieve Goals: Good  Plan         AM-PAC OT 6 Clicks Daily Activity     Outcome Measure   Help from another person eating meals?: A Little Help from another person taking care of personal grooming?: A Little Help from another person toileting, which includes using toliet, bedpan, or urinal?: Total Help from another person bathing (including washing, rinsing, drying)?: A Lot Help from another person to put on and taking off regular upper body clothing?: A Lot Help from another person to put on and taking off regular lower body clothing?: Total 6 Click Score: 12    End of Session Equipment Utilized During Treatment: Other (comment)  OT Visit Diagnosis: Unsteadiness on feet (R26.81);Muscle weakness (generalized) (M62.81);Pain;Other abnormalities of gait and mobility (R26.89) Pain - part of body:  (abdomen)   Activity Tolerance Patient limited by pain   Patient Left in bed;with call bell/phone within reach;with bed alarm set   Nurse Communication Mobility status        Time: 8784-8767 OT Time Calculation (min): 17 min  Charges: OT General Charges $OT Visit: 1 Visit OT Treatments $Therapeutic Activity: 8-22 mins     Delanna LITTIE Molt, OTR/L 03/14/2024, 2:30 PM

## 2024-03-14 NOTE — Progress Notes (Signed)
 PHARMACY - TOTAL PARENTERAL NUTRITION CONSULT NOTE   Indication: Prolonged ileus  Patient Measurements: Height: 5' 4 (162.6 cm) Weight: 75.2 kg (165 lb 12.6 oz) IBW/kg (Calculated) : 54.7 TPN AdjBW (KG): 60.8 Body mass index is 28.46 kg/m.  Assessment: 78 year old female s/p hysterectomy with D&C on 7/1. 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 Taken back to the OR 7/2 for ex-lap and partial colectomy and ileostomy.  Pharmacy consulted for TPN 7/7.  Glucose / Insulin : Januvia per PTA med list -CBGs have ranged 198-284,  goal < 150 -32u SSI in 24 hrs  Electrolytes:  Lytes WNL exc Na low at 127. K+ now WNL at 4.0,  CoCa 9.9  Renal: Scr stable  Hepatic: Alkphos 99>>144 up, AST 59>90 up, ALT 54>118 up, Tbili 1.2>0.5 down  Intake / Output; MIVF:  - PO: 365, Diet Carb Modified Fluid consistency: Thin, Resource Breeze x 2 -UOP: -Drains 20 ml -Stool: 150 (LBM 7/13) on daily Miralax   GI Imaging: -7/6 CT A/P 4.6 cm diameter air-fluid level in the anterior pelvis in or adjacent to the sigmoid colon; this may represent an abscess or an air-fluid level within a dilated segment of colon  GI Surgeries / Procedures:  -7/2 ex lap, colectomy w/ colostomy -7/1 hysterectomy  Central access: PICC TPN start date: 7/7  Nutritional Goals: Goal TPN rate is 75 mL/hr (provides 104 g of protein and 1778 kcals per day)  RD Assessment: Estimated Needs Total Energy Estimated Needs: 1750-1900 kcals Total Protein Estimated Needs: 90-110 grams Total Fluid Estimated Needs: >/= 1.8L  Current Nutrition:  7/10: CLD  7/11: Diet Carb Modified Fluid consistency: Thin   Plan:  Per Dr. [name] keep on TPN at 83ml/hr for today  Electrolytes in TPN: Na 150 (max) mEq/L, K 20 mEq/L, Ca 0, Mg 5 mEq/L, and Phos 20 mmol/L. Cl:Ac 2:1 Continue multivitamin to PO Continue moderate SSI q4h - 7/14: Resume home SGLT2 inhibitor for better glucose control. -  7/15 add Insulin  10 units to TPN for better CBG control  Thiamine  x 6 days completed 7/12 Monitor TPN labs on Mon/Thurs and PRN   Swan Zayed, PharmD, BCPS 03/14/2024 11:38 AM

## 2024-03-14 NOTE — Progress Notes (Signed)
 Physical Therapy Treatment Patient Details Name: Alexandria Durham MRN: [id number] DOB: [birth year] Today's Date: 03/14/2024   History of Present Illness Patient is a 78 year old female who presented on 03/01/2024 following a D and C on 7/1 for thickened endometrium and procedure complicated due to bowel perforation. Pt developed signs and symptoms of sepsis and shock. 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  Pt required post op ventilation and extubated on 7/4.  Pt PMH includes but is not limited to:  recent hospital admissions for diverticulitis, pancreatitis, abdominal pain, D&C  01/06/24 with uterine perforation, cholecystectomy and d&C on 01/17/24, DM II, HTN, COPD, GERD and CKD III.    PT Comments  Improved activity tolerance today. Utilized Stedy lift equipment to assist pt with sit to stand. She was able to stand for ~2 minutes and performed weight shifting, mini squats, and marching. Standing tolerance limited by 10/10 abdominal pain and fatigue. Pt requires min assist for sitting balance 2* posterior lean. +2 assist for rolling and sidelying to sit.    If plan is discharge home, recommend the following: Two people to help with walking and/or transfers;A lot of help with bathing/dressing/bathroom;Assistance with cooking/housework;Assist for transportation   Can travel by private vehicle     No  Equipment Recommendations  Wheelchair cushion (measurements PT);Wheelchair (measurements PT);Hoyer lift;Hospital bed;BSC/3in1    Recommendations for Other Services       Precautions / Restrictions Precautions Precautions: Fall Recall of Precautions/Restrictions: Intact Precaution/Restrictions Comments: Abdominal sx - wound vac, JP drain, ostomy, abdominal binder- for OOB activity Required Braces or Orthoses: Other Brace (abd binder) Restrictions Other Position/Activity Restrictions: wound vac, JP drain, ostomy, abdominal binder when out of bed     Mobility  Bed  Mobility Overal bed mobility: Needs Assistance Bed Mobility: Rolling, Sidelying to Sit Rolling: Max assist, +2 for safety/equipment Sidelying to sit: Max assist, +2 for safety/equipment, HOB elevated, Used rails       General bed mobility comments: + 2 for management of lines, drain and tubes, assist legs over edge and trunk to upright    Transfers Overall transfer level: Needs assistance   Transfers: Sit to/from Stand Sit to Stand: Via lift equipment, Mod assist, +2 safety/equipment, +2 physical assistance           General transfer comment: mod A to power up, pt able to stand in [place] for ~ 2 minutes, performed lateral weight shifts, minisquats and  marching in standing in stedy Transfer via Lift Equipment: Stedy  Ambulation/Gait                   Stairs             Wheelchair Mobility     Tilt Bed    Modified Rankin (Stroke Patients Only)       Balance Overall balance assessment: Needs assistance Sitting-balance support: Bilateral upper extremity supported, Feet supported Sitting balance-Leahy Scale: Poor Sitting balance - Comments: min A for  posterior bias, difficulty reaching forward  to hold bedrail Postural control: Posterior lean Standing balance support: Bilateral upper extremity supported, During functional activity, Reliant on assistive device for balance Standing balance-Leahy Scale: Poor                              Communication Communication Communication: No apparent difficulties  Cognition Arousal: Alert Behavior During Therapy: Anxious   PT - Cognitive impairments: Problem solving  PT - Cognition Comments: pt fearful of falling, requires verbal and manual cues to follow commands   Following commands impaired: Follows one step commands inconsistently    Cueing Cueing Techniques: Verbal cues, Gestural cues, Tactile cues  Exercises      General Comments        Pertinent  Vitals/Pain Pain Assessment Pain Score: 10-Worst pain ever Pain Location: abdomen Pain Descriptors / Indicators: Guarding, Grimacing, Moaning Pain Intervention(s): Limited activity within patient's tolerance, Monitored during session, Repositioned, Patient requesting pain meds-RN notified    Home Living                          Prior Function            PT Goals (current goals can now be found in the care plan section) Acute Rehab PT Goals Patient Stated Goal: not stated PT Goal Formulation: With patient Time For Goal Achievement: 03/21/24 Potential to Achieve Goals: Good Progress towards PT goals: Progressing toward goals    Frequency    Min 2X/week      PT Plan      Co-evaluation              AM-PAC PT 6 Clicks Mobility   Outcome Measure  Help needed turning from your back to your side while in a flat bed without using bedrails?: A Lot Help needed moving from lying on your back to sitting on the side of a flat bed without using bedrails?: Total Help needed moving to and from a bed to a chair (including a wheelchair)?: Total Help needed standing up from a chair using your arms (e.g., wheelchair or bedside chair)?: A Lot Help needed to walk in hospital room?: Total Help needed climbing 3-5 steps with a railing? : Total 6 Click Score: 8    End of Session   Activity Tolerance: Patient limited by pain;Patient limited by fatigue Patient left: in chair;with call bell/phone within reach;with chair alarm set Nurse Communication: Mobility status;Need for lift equipment PT Visit Diagnosis: Difficulty in walking, not elsewhere classified (R26.2);Muscle weakness (generalized) (M62.81);Pain     Time: 9140-9077 PT Time Calculation (min) (ACUTE ONLY): 23 min  Charges:    $Therapeutic Activity: 23-37 mins PT General Charges $$ ACUTE PT VISIT: 1 Visit                     Sylvan Delon Copp PT 03/14/2024  Acute Rehabilitation Services  Office  [phone]

## 2024-03-15 ENCOUNTER — Telehealth: Payer: Self-pay | Admitting: Oncology

## 2024-03-15 DIAGNOSIS — K631 Perforation of intestine (nontraumatic): Secondary | ICD-10-CM | POA: Diagnosis not present

## 2024-03-15 DIAGNOSIS — R9389 Abnormal findings on diagnostic imaging of other specified body structures: Secondary | ICD-10-CM | POA: Diagnosis not present

## 2024-03-15 DIAGNOSIS — M5412 Radiculopathy, cervical region: Secondary | ICD-10-CM

## 2024-03-15 DIAGNOSIS — N950 Postmenopausal bleeding: Secondary | ICD-10-CM | POA: Diagnosis not present

## 2024-03-15 LAB — GLUCOSE, CAPILLARY
Glucose-Capillary: 176 mg/dL — ABNORMAL HIGH (ref 70–99)
Glucose-Capillary: 181 mg/dL — ABNORMAL HIGH (ref 70–99)
Glucose-Capillary: 194 mg/dL — ABNORMAL HIGH (ref 70–99)
Glucose-Capillary: 220 mg/dL — ABNORMAL HIGH (ref 70–99)
Glucose-Capillary: 232 mg/dL — ABNORMAL HIGH (ref 70–99)

## 2024-03-15 LAB — CBC
HCT: 23.4 % — ABNORMAL LOW (ref 36.0–46.0)
Hemoglobin: 7.9 g/dL — ABNORMAL LOW (ref 12.0–15.0)
MCH: 30.2 pg (ref 26.0–34.0)
MCHC: 33.8 g/dL (ref 30.0–36.0)
MCV: 89.3 fL (ref 80.0–100.0)
Platelets: 362 K/uL (ref 150–400)
RBC: 2.62 MIL/uL — ABNORMAL LOW (ref 3.87–5.11)
RDW: 16.8 % — ABNORMAL HIGH (ref 11.5–15.5)
WBC: 8.7 K/uL (ref 4.0–10.5)
nRBC: 0.3 % — ABNORMAL HIGH (ref 0.0–0.2)

## 2024-03-15 LAB — BASIC METABOLIC PANEL WITH GFR
Anion gap: 8 (ref 5–15)
BUN: 27 mg/dL — ABNORMAL HIGH (ref 8–23)
CO2: 23 mmol/L (ref 22–32)
Calcium: 8.3 mg/dL — ABNORMAL LOW (ref 8.9–10.3)
Chloride: 97 mmol/L — ABNORMAL LOW (ref 98–111)
Creatinine, Ser: 0.78 mg/dL (ref 0.44–1.00)
GFR, Estimated: >60 mL/min (ref >60)
Glucose, Bld: 202 mg/dL — ABNORMAL HIGH (ref 70–99)
Potassium: 4 mmol/L (ref 3.5–5.1)
Sodium: 128 mmol/L — ABNORMAL LOW (ref 135–145)

## 2024-03-15 LAB — PHOSPHORUS: Phosphorus: 3.1 mg/dL (ref 2.5–4.6)

## 2024-03-15 LAB — MAGNESIUM: Magnesium: 1.8 mg/dL (ref 1.7–2.4)

## 2024-03-15 MED ORDER — TRAVASOL 10 % IV SOLN
INTRAVENOUS | Status: AC
  Filled 2024-03-15: qty 696

## 2024-03-15 NOTE — Progress Notes (Signed)
 Physical Therapy Treatment Patient Details Name: Alexandria Durham MRN: [id number] DOB: April 10, 1946 Today's Date: 03/15/2024   History of Present Illness Patient is a 78 year old female who presented on 03/01/2024 following a D and C on 7/1 for thickened endometrium and procedure complicated due to bowel perforation. Pt developed signs and symptoms of sepsis and shock. 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  Pt required post op ventilation and extubated on 7/4.  Pt PMH includes but is not limited to:  recent hospital admissions for diverticulitis, pancreatitis, abdominal pain, D&C  01/06/24 with uterine perforation, cholecystectomy and d&C on 01/17/24, DM II, HTN, COPD, GERD and CKD III.    PT Comments  AxO x 3 OOB in recliner.  Pt stated, I got up with out that thing (STEDY). Assisted with getting out of recliner.  General transfer comment: Pt required + 2 Max side by side assist to rise from recliner onto B platform EVA walker.  Partially upright, Pt was able to static stand in EVA walker at [employer]. General Gait Details: Pt has NOT amb since admit, so used B platform EVA walker and 2 assist Pt was able to amb 25 feet with short shuffled steps and HEAVY lean on Platform Walker.  Recliner following for safety. Returned to room in recliner.  Positioned to comfort. LPT has rec Pt will need ST Rehab at SNF to address mobility and functional decline prior to safely returning home alone.  Prior Pt was Indep and driving.    If plan is discharge home, recommend the following: Two people to help with walking and/or transfers;A lot of help with bathing/dressing/bathroom;Assistance with cooking/housework;Assist for transportation   Can travel by private vehicle     No  Equipment Recommendations       Recommendations for Other Services       Precautions / Restrictions Precautions Precautions: Fall Recall of Precautions/Restrictions: Intact Precaution/Restrictions Comments:  Abdominal sx - wound vac, JP drain, ostomy, abdominal binder- for OOB activity Other Brace: abdominal binder when out of bed Restrictions Weight Bearing Restrictions Per Provider Order: No     Mobility  Bed Mobility               General bed mobility comments: OOB in recliner    Transfers Overall transfer level: Needs assistance Equipment used: 2 person hand held assist Transfers: Sit to/from Stand Sit to Stand: Max assist, +2 safety/equipment, +2 physical assistance           General transfer comment: Pt required + 2 Max side by side assist to rise from recliner onto B platform EVA walker.  Partially upright, Pt was able to static stand in EVA walker at [employer] Assist.    Ambulation/Gait Ambulation/Gait assistance: Max assist, +2 physical assistance, +2 safety/equipment Gait Distance (Feet): 25 Feet Assistive device: Elyn Finder Gait Pattern/deviations: Step-through pattern, Decreased stride length, Decreased stance time - left, Trunk flexed Gait velocity: decreased     General Gait Details: Pt has NOT amb since admit, so used B platform EVA walker and 2 assist Pt was able to amb 25 feet with short shuffled steps and HEAVY lean on Platform Walker.  Recliner following for safety.   Stairs             Wheelchair Mobility     Tilt Bed    Modified Rankin (Stroke Patients Only)       Balance  Communication Communication Communication: No apparent difficulties  Cognition Arousal: Alert                             PT - Cognition Comments: AxO x 3 pleasant and willing.  A little scared.  Has not walked since admit. Following commands: Intact      Cueing Cueing Techniques: Verbal cues  Exercises      General Comments        Pertinent Vitals/Pain Pain Assessment Pain Assessment: 0-10 Pain Score: 5  Pain Location: abdomen Pain Descriptors / Indicators: Guarding,  Grimacing, Moaning Pain Intervention(s): Monitored during session    Home Living                          Prior Function            PT Goals (current goals can now be found in the care plan section) Progress towards PT goals: Progressing toward goals    Frequency    Min 2X/week      PT Plan      Co-evaluation              AM-PAC PT 6 Clicks Mobility   Outcome Measure  Help needed turning from your back to your side while in a flat bed without using bedrails?: A Lot Help needed moving from lying on your back to sitting on the side of a flat bed without using bedrails?: A Lot Help needed moving to and from a bed to a chair (including a wheelchair)?: A Lot Help needed standing up from a chair using your arms (e.g., wheelchair or bedside chair)?: A Lot Help needed to walk in hospital room?: A Lot Help needed climbing 3-5 steps with a railing? : Total 6 Click Score: 11    End of Session Equipment Utilized During Treatment: Gait belt Activity Tolerance: Patient limited by pain;Patient limited by fatigue Patient left: in chair;with call bell/phone within reach;with chair alarm set Nurse Communication: Mobility status PT Visit Diagnosis: Difficulty in walking, not elsewhere classified (R26.2);Muscle weakness (generalized) (M62.81);Pain     Time: 1207-1234 PT Time Calculation (min) (ACUTE ONLY): 27 min  Charges:    $Gait Training: 8-22 mins $Therapeutic Activity: 8-22 mins PT General Charges $$ ACUTE PT VISIT: 1 Visit                     Katheryn Leap  PTA Acute  Rehabilitation Services Office M-F          [phone]

## 2024-03-15 NOTE — Progress Notes (Signed)
 PHARMACY - TOTAL PARENTERAL NUTRITION CONSULT NOTE   Indication: Prolonged ileus  Patient Measurements: Height: 5' 4 (162.6 cm) Weight: 77.8 kg (171 lb 8.3 oz) IBW/kg (Calculated) : 54.7 TPN AdjBW (KG): 60.8 Body mass index is 29.44 kg/m.  Assessment: 78 year old female s/p hysterectomy with D&C on 7/1. 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 Taken back to the OR 7/2 for ex-lap and partial colectomy and ileostomy.  Pharmacy consulted for TPN 7/7.  Glucose / Insulin : hx DM - on mSSI q4h , Tradjenta  added on 7/14 - CBGs: 152-271 (goal < 150); used 27 units SSI in 24 hrs  Electrolytes: Na low 128, CL slightly low 97, Mag 1.8; other lytes wnl including CorrCa pf 10.06  Renal: Scr <1 and stable, BUN stable at 27  Hepatic: labs on 7/14: AST/ALT 90/118; Alk phos 144; Tbili 0.5   Intake / Output; MIVF:  - I/O -1088 mL -UOP: -Drains 20 ml -Stool: 250 (LBM 7/13) on daily Miralax   GI Imaging: -7/6 CT A/P 4.6 cm diameter air-fluid level in the anterior pelvis in or adjacent to the sigmoid colon; this may represent an abscess or an air-fluid level within a dilated segment of colon  GI Surgeries / Procedures:  -7/2 ex lap, colectomy w/ colostomy -7/1 hysterectomy  Central access: PICC TPN start date: 7/7  Nutritional Goals: Goal TPN rate is 75 mL/hr (provides 104 g of protein and 1778 kcals per day)  RD Assessment: Estimated Needs Total Energy Estimated Needs: 1750-1900 kcals Total Protein Estimated Needs: 90-110 grams Total Fluid Estimated Needs: >/= 1.8L  Current Nutrition:  7/10: CLD  7/11: Diet Carb Modified Fluid consistency: Thin   - ensure plus high protein 237 mL TID - MVI PO daily   Plan:   - TPN rate decreased to 50 ml/hr on 03/14/24  Today, at 1800:  - Electrolytes in TPN:  Na 150 (max) mEq/L K 20 mEq/L Ca 0 Increase Mg to 7 mEq/L Phos 20 mmol/L Cl:Ac 2:1 - Continue multivitamin to PO - Continue  moderate SSI q4h - Increase Insulin  in TPN bag to 15 units to TPN  - Thiamine  x 6 days completed 7/12 - Monitor TPN labs on Mon/Thurs and PRN   Iantha Batch, PharmD, BCPS 03/15/2024 8:20 AM

## 2024-03-15 NOTE — Progress Notes (Addendum)
 [name]  PROGRESS NOTE  Alexandria Durham FMW:[id number] DOB: 14-Jun-1946 DOA: 02/29/2024 PCP: [name], NP   Brief HPI:   78 y.o.  F with PMH significant for thickened endometrium, HTN, Type 2 DM, CKD stage IIIa, recent pancreatitis and acute cholecystitis s/p lap chole and D&C in May of this year who was admitted 7/1 for hysteroscopy and repeat D&C.  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 Pt was acidotic post-procedure, so decision made to leave intubated, PCCM consulted in this setting.  Significant events as below.  Patient was eventually transferred under [facility] to comanage on 03/07/2024 while GYN oncology remains the primary service. Significant events Patient was admitted to the ICU postop intubated on the 7/2 extubated 7/ 4 patient transferred under [facility] for continuation of co management 7/8    Assessment/Plan:   S/p D&C procedure complicated by bowel perforation S/p partial colectomy and ileostomy  Septic shock, shock resolved  Presented for d&c on 7/1 c/b bowel perforation. OR 7/2 after development of septic shock, zosyn  started. Had ex lap with sigmoidoscopy, partial colectomy  and ileostomy placement. CT AP 7/6 showing sigmoid diverticulitis, air-fluid level 4.6cm unable to discern abscess vs segment of colon.  Leukocytosis resolved Culture with E coli and Enterococcus sensitivity pending antibiotics changed to Zosyn  and zyvox  (Zosyn  03/14/2024 and Zyvox  03/13/2024 started) patient started on TPN.  Management per primary service.   Hyperkalemia  resolved after Lokelma   Hyponatremia -Sodium is 128 - Currently on TPN   Chest pain  7/6 complained of chest pressure. Trop x2 flat @ 22. EKG non ischemic. CXR with no acute findings.  Monitor on telemetry.   AKI on CKD stage IIIa: Resolved creatinine 0.84   Acute Thrombocytopenia  resolved   Type 2 DM Hypoglycemia: Resolved on TPN   Hypothyroidism  - con't synthroid  75mcg daily     Hyperlipidemia  HTN - con't zetia  10mg  daily  - hydralazine  prn for SBP > 180      Respiratory insufficiency: Patient on 2 L of oxygen saturating 96% chest x-ray shows evidence of atelectasis no consolidation.   Echocardiogram  normal ejection fraction as below.  She received a dose of Lasix  40 mg x 1 yesterday.  Chest xray- 7/10  Interval placement of right arm PICC as described. No pneumothorax.Persistent low lung volumes with mild bibasilar atelectasis.   Echo 7/10- Left ventricular ejection fraction, by estimation, is 65 to 70%. The  left ventricle has normal function. The left ventricle has no regional  wall motion abnormalities. There is mild concentric left ventricular  hypertrophy. Left ventricular diastolic  parameters are consistent with Grade I diastolic dysfunction (impaired  relaxation).  Right ventricular systolic function is normal. The right ventricular  size is normal. The mitral valve is normal in structure. No evidence of mitral valve  regurgitation. No evidence of mitral stenosis.  The aortic valve is tricuspid. There is mild calcification of the  aortic valve. Aortic valve regurgitation is not visualized. Aortic valve  sclerosis/calcification is present, without any evidence of aortic  stenosis.    Nutrition Problem: Severe Malnutrition Etiology: acute illness  Signs/Symptoms: energy intake < or equal to 50% for > or equal to 5 days, mild muscle depletion, percent weight loss (10% in 1.5 months) Percent weight loss: 10 % (in 1.5 months)    Medications  buPROPion   150 mg Oral QPM   Chlorhexidine  Gluconate Cloth  6 each Topical Daily   enoxaparin  (LOVENOX ) injection  40 mg Subcutaneous Q24H   ezetimibe   10 mg Oral Daily   feeding supplement  237 mL Oral TID BM   gabapentin   300 mg Oral TID   insulin  aspart  0-15 Units Subcutaneous Q4H    levothyroxine   75 mcg Oral QAC breakfast   lidocaine   1 patch Transdermal Q24H   linagliptin   5 mg Oral Daily   linezolid   600 mg Oral Q12H   melatonin  3 mg Oral QHS   multivitamin with minerals  1 tablet Oral Daily   pantoprazole   40 mg Oral QHS   polyethylene glycol  17 g Oral Daily   sodium chloride  flush  10-40 mL Intracatheter Q12H   sodium chloride  flush  10-40 mL Intracatheter Q12H     Data Reviewed:   CBG:  Recent Labs  Lab 03/14/24 2027 03/15/24 0004 03/15/24 0405 03/15/24 0743 03/15/24 1138  GLUCAP 224* 232* 194* 181* 220*    SpO2: 97 % O2 Flow Rate (L/min): 2 L/min FiO2 (%): 30 %    Vitals:   03/14/24 2025 03/15/24 0406 03/15/24 0440 03/15/24 1238  BP: 134/76 (!) 141/63  (!) 143/96  Pulse: 97 89  93  Resp: 18 18  20   Temp: 98.8 F (37.1 C) 98 F (36.7 C)  98.4 F (36.9 C)  TempSrc: Oral Oral  Oral  SpO2: 94% 99%  97%  Weight:   77.8 kg   Height:          Data Reviewed:  Basic Metabolic Panel: Recent Labs  Lab 03/09/24 0535 03/10/24 0600 03/11/24 0549 03/12/24 0546 03/13/24 0518 03/14/24 0539 03/15/24 0427  NA 127* 129* 128* 131* 130* 127* 128*  K 4.6 4.7 4.7 5.2* 4.7 4.0 4.0  CL 98 98 99 100 97* 98 97*  CO2 22 24 21* 22 22 23 23   GLUCOSE 111* 137* 170* 177* 218* 204* 202*  BUN 19 23 23  26* 27* 27* 27*  CREATININE 0.96 1.01* 0.84 0.96 0.80 0.82 0.78  CALCIUM  7.2* 7.9* 8.3* 8.9 8.8* 8.1* 8.3*  MG 1.8 1.7  --  1.7 1.8  --  1.8  PHOS 3.5 3.7  --  3.5 3.8  --  3.1    CBC: Recent Labs  Lab 03/11/24 0549 03/12/24 0546 03/13/24 1040 03/14/24 0539 03/15/24 0427  WBC 6.4 8.2 8.5 8.5 8.7  HGB 9.5* 8.8* 9.0* 8.2* 7.9*  HCT 27.7* 26.3* 27.0* 24.6* 23.4*  MCV 88.2 88.9 89.4 89.8 89.3  PLT 166 241 331 350 362    LFT Recent Labs  Lab 03/09/24 0535 03/13/24 0518  AST 59* 90*  ALT 54* 118*  ALKPHOS 99 144*  BILITOT 1.2 0.5  PROT 4.8* 5.6*  ALBUMIN  1.9* 1.8*     Antibiotics: Anti-infectives (From admission, onward)     Start     Dose/Rate Route Frequency Ordered Stop   03/14/24 1000  linezolid  (ZYVOX ) tablet 600 mg        600 mg Oral Every 12 hours 03/14/24 0903     03/14/24 0930  piperacillin -tazobactam (ZOSYN ) IVPB 3.375 g        3.375 g 12.5 mL/hr over 240 Minutes Intravenous Every 8 hours 03/14/24 0833     03/13/24 1300  linezolid  (ZYVOX ) IVPB 600 mg  Status:  Discontinued        600 mg 300 mL/hr over 60 Minutes Intravenous Every 12  hours 03/13/24 1200 03/14/24 0903   03/13/24 1300  ceFEPIme  (MAXIPIME ) 2 g in sodium chloride  0.9 % 100 mL IVPB  Status:  Discontinued        2 g 200 mL/hr over 30 Minutes Intravenous Every 8 hours 03/13/24 1200 03/14/24 0833   03/10/24 1200  amoxicillin -clavulanate (AUGMENTIN ) 875-125 MG per tablet 1 tablet  Status:  Discontinued        1 tablet Oral Every 12 hours 03/10/24 1114 03/13/24 1200   03/01/24 1200  piperacillin -tazobactam (ZOSYN ) IVPB 3.375 g  Status:  Discontinued        3.375 g 12.5 mL/hr over 240 Minutes Intravenous Every 8 hours 03/01/24 0946 03/01/24 1116   03/01/24 1130  piperacillin -tazobactam (ZOSYN ) IVPB 3.375 g  Status:  Discontinued        3.375 g 12.5 mL/hr over 240 Minutes Intravenous Every 8 hours 03/01/24 1116 03/10/24 1114          Subjective   Denies any complaints   Objective    Physical Examination:   General-appears in no acute distress Heart-S1-S2, regular, no murmur auscultated Lungs-clear to auscultation bilaterally, no wheezing or crackles auscultated Abdomen-soft, nontender, no organomegaly Extremities-no edema in the lower extremities Neuro-alert, oriented x3, no focal deficit noted  Status is: Inpatient:             Sabas GORMAN Brod   [facility] If 7PM-7AM, please contact night-coverage at [email or web address], Office  [phone]   03/15/2024, 4:04 PM  LOS: 15 days

## 2024-03-15 NOTE — Progress Notes (Addendum)
 14 Days Post-Op Procedure(s) (LRB): LAPAROTOMY, EXPLORATORY (N/A) SIGMOIDOSCOPY COLECTOMY, WITH COLOSTOMY CREATION MOBILIZATION, SPLENIC FLEXURE, WITH PARTIAL COLECTOMY CYSTOSCOPY  Subjective: Patient reports abdomen feeling full. She tried to eat bacon and toast but felt this food was too heavy. Trying to sip on ensure now. No nausea or emesis reported. Having some abdominal discomfort at this time. Denies chest pain, dyspnea. Sat up in the chair with PT assist yesterday.  Objective: Vital signs in last 24 hours: Temp:  [98 F (36.7 C)-98.8 F (37.1 C)] 98 F (36.7 C) (07/16 0406) Pulse Rate:  [89-97] 89 (07/16 0406) Resp:  [18] 18 (07/16 0406) BP: (123-141)/(63-83) 141/63 (07/16 0406) SpO2:  [94 %-99 %] 99 % (07/16 0406) Weight:  [171 lb 8.3 oz (77.8 kg)] 171 lb 8.3 oz (77.8 kg) (07/16 0440) Last BM Date : 03/13/24  Intake/Output from previous day: 07/15 0701 - 07/16 0700 In: 1582.3 [P.O.:240; I.V.:1151.9; IV Piggyback:190.4] Out: 2670 [Urine:2400; Drains:20; Stool:250]  Physical Examination: General: Alert, oriented, no acute distress, resting in bed comfortably Respiratory: Normal work of breathing. Lungs clear to auscultation bilaterally without rhonchi. Cardiovascular: Regular sinus rhythm and rate, no murmurs or rubs. Abdomen: soft, nondistended, + BS, tender to palpation diffusely (stable). Incision: wound VAC in place to suction. JP drain with clear serous fluid in tubing. Dry dressing around drain. Ostomy with stool, flatus. See media for updated photo of stoma from Monday.   Extremities: extremities warm and well perfused with trace edema, SCDs in place Gu: Purewick in place with clear yellow urine  Labs:    Latest Ref Rng & Units 03/15/2024    4:27 AM 03/14/2024    5:39 AM 03/13/2024   10:40 AM  CBC  WBC 4.0 - 10.5 K/uL 8.7  8.5  8.5   Hemoglobin 12.0 - 15.0 g/dL 7.9  8.2  9.0   Hematocrit 36.0 - 46.0 % 23.4  24.6  27.0   Platelets 150 - 400 K/uL 362  350  331        Latest Ref Rng & Units 03/15/2024    4:27 AM 03/14/2024    5:39 AM 03/13/2024    5:18 AM  BMP  Glucose 70 - 99 mg/dL 797  795  781   BUN 8 - 23 mg/dL 27  27  27    Creatinine 0.44 - 1.00 mg/dL 9.21  9.17  9.19   Sodium 135 - 145 mmol/L 128  127  130   Potassium 3.5 - 5.1 mmol/L 4.0  4.0  4.7   Chloride 98 - 111 mmol/L 97  98  97   CO2 22 - 32 mmol/L 23  23  22    Calcium  8.9 - 10.3 mg/dL 8.3  8.1  8.8    Assessment: 78 y.o. s/p Procedure(s): LAPAROTOMY, EXPLORATORY SIGMOIDOSCOPY COLECTOMY, WITH COLOSTOMY CREATION MOBILIZATION, SPLENIC FLEXURE, WITH PARTIAL COLECTOMY CYSTOSCOPY  Post-Op: Overall working on post-op milestones. Continues to have decreased appetite. Improved bowel function, continue charting ostomy output. Working on increasing PO intake with plan to wean TPN once intake improves. Continued to stress importance of getting up at least once a day, even if to sit on side of bed or stand. Excellent UOP.   Per Dr. [name], when patient is discharged, JP drain can be removed and will need to have a wick placed in the opening to promote drainage. Recommendation for 14 days total of zosyn  post-operatively and 14 days of linezolid .  Leukocytosis: WBC 8.7 this am. Purulent drainage from around JP drain site - culture  with few/rare e coli and enterococcus - susceptibilities returned 03/13/24. Currently on zosyn  and linezolid . Per ID pharmacist, linezolid  can be changed to oral, cefazolin  added to susceptibility testing earlier in the week. Afebrile. With [facility] dressing change 03/13/24, purulent drainage noted coming from the anterior aspect of the incision-culture obtained.    Malnutrition: on TPN given prolonged period of NPO. Will work on weaning TPN as PO intake improves. Given poor PO intake over last 24 hours, will continue TPN at current rate. Albumin  2.8 early during this admission. Continue nutritional supplements. Continue calorie count with majority of meal tickets stating  patient refused food.   Hyponatremia: asymptomatic, Na+128 this am.   Acute on chronic anemia: asymptomatic, continue to monitor.   Type 2 diabetes mellitus: Continue insulin  sliding scale.  CBG (last 3)  Recent Labs    03/14/24 2027 03/15/24 0004 03/15/24 0405  GLUCAP 224* 232* 194*      Prophylaxis: SCDs, on lovenox   Physical deconditioning: PT ordered. Up and out of bed as able. Plan for SNF when medically stable for discharge.    LOS: 15 days    Alexandria Durham 03/15/2024, 7:59 AM

## 2024-03-15 NOTE — Telephone Encounter (Signed)
 Called [name] (daughter) an gave her an update on her mom per [name], NP: Let her know that there are no changes as of today. We will continue with the plan and wait for her oral intake to increase in order to decrease the TPN. She was looking good this am, just said she felt full (abdomen wise) so hadnt taken in much. She was resting comfortably in bed  [name] verbalized agreement and said she is encouraging her to eat when she visits and was able to get her to drink some Ensure yesterday.

## 2024-03-16 DIAGNOSIS — R9389 Abnormal findings on diagnostic imaging of other specified body structures: Secondary | ICD-10-CM | POA: Diagnosis not present

## 2024-03-16 DIAGNOSIS — N950 Postmenopausal bleeding: Secondary | ICD-10-CM | POA: Diagnosis not present

## 2024-03-16 DIAGNOSIS — K631 Perforation of intestine (nontraumatic): Secondary | ICD-10-CM | POA: Diagnosis not present

## 2024-03-16 DIAGNOSIS — M5412 Radiculopathy, cervical region: Secondary | ICD-10-CM | POA: Diagnosis not present

## 2024-03-16 LAB — CBC
HCT: 23.5 % — ABNORMAL LOW (ref 36.0–46.0)
Hemoglobin: 7.8 g/dL — ABNORMAL LOW (ref 12.0–15.0)
MCH: 30 pg (ref 26.0–34.0)
MCHC: 33.2 g/dL (ref 30.0–36.0)
MCV: 90.4 fL (ref 80.0–100.0)
Platelets: 384 K/uL (ref 150–400)
RBC: 2.6 MIL/uL — ABNORMAL LOW (ref 3.87–5.11)
RDW: 17.1 % — ABNORMAL HIGH (ref 11.5–15.5)
WBC: 8.6 K/uL (ref 4.0–10.5)
nRBC: 0.3 % — ABNORMAL HIGH (ref 0.0–0.2)

## 2024-03-16 LAB — COMPREHENSIVE METABOLIC PANEL WITH GFR
ALT: 86 U/L — ABNORMAL HIGH (ref 0–44)
AST: 48 U/L — ABNORMAL HIGH (ref 15–41)
Albumin: 1.8 g/dL — ABNORMAL LOW (ref 3.5–5.0)
Alkaline Phosphatase: 126 U/L (ref 38–126)
Anion gap: 9 (ref 5–15)
BUN: 25 mg/dL — ABNORMAL HIGH (ref 8–23)
CO2: 23 mmol/L (ref 22–32)
Calcium: 8.6 mg/dL — ABNORMAL LOW (ref 8.9–10.3)
Chloride: 100 mmol/L (ref 98–111)
Creatinine, Ser: 0.89 mg/dL (ref 0.44–1.00)
GFR, Estimated: >60 mL/min (ref >60)
Glucose, Bld: 178 mg/dL — ABNORMAL HIGH (ref 70–99)
Potassium: 3.9 mmol/L (ref 3.5–5.1)
Sodium: 132 mmol/L — ABNORMAL LOW (ref 135–145)
Total Bilirubin: 0.7 mg/dL (ref 0.0–1.2)
Total Protein: 6.3 g/dL — ABNORMAL LOW (ref 6.5–8.1)

## 2024-03-16 LAB — AEROBIC CULTURE W GRAM STAIN (SUPERFICIAL SPECIMEN)

## 2024-03-16 LAB — GLUCOSE, CAPILLARY
Glucose-Capillary: 132 mg/dL — ABNORMAL HIGH (ref 70–99)
Glucose-Capillary: 148 mg/dL — ABNORMAL HIGH (ref 70–99)
Glucose-Capillary: 165 mg/dL — ABNORMAL HIGH (ref 70–99)
Glucose-Capillary: 207 mg/dL — ABNORMAL HIGH (ref 70–99)
Glucose-Capillary: 208 mg/dL — ABNORMAL HIGH (ref 70–99)
Glucose-Capillary: 201 mg/dL — ABNORMAL HIGH (ref 70–99)

## 2024-03-16 LAB — PHOSPHORUS: Phosphorus: 3.3 mg/dL (ref 2.5–4.6)

## 2024-03-16 LAB — MAGNESIUM: Magnesium: 2 mg/dL (ref 1.7–2.4)

## 2024-03-16 MED ORDER — KATE FARMS STANDARD 1.4 PO LIQD
325.0000 mL | Freq: Three times a day (TID) | ORAL | Status: DC
  Administered 2024-03-16 – 2024-04-04 (×46): 325 mL via ORAL
  Filled 2024-03-16 (×59): qty 325

## 2024-03-16 MED ORDER — SODIUM CHLORIDE 0.9 % IV SOLN
1.0000 g | Freq: Three times a day (TID) | INTRAVENOUS | Status: DC
  Administered 2024-03-16 – 2024-03-25 (×27): 1 g via INTRAVENOUS
  Filled 2024-03-16 (×28): qty 20

## 2024-03-16 MED ORDER — TRAVASOL 10 % IV SOLN
INTRAVENOUS | Status: AC
  Filled 2024-03-16: qty 696

## 2024-03-16 NOTE — Progress Notes (Signed)
 [name]  PROGRESS NOTE  Alexandria Durham FMW:[id number] DOB: [birth year] DOA: 02/29/2024 PCP: [name], NP   Brief HPI:   78 y.o.  F with PMH significant for thickened endometrium, HTN, Type 2 DM, CKD stage IIIa, recent pancreatitis and acute cholecystitis s/p lap chole and D&C in May of this year who was admitted 7/1 for hysteroscopy and repeat D&C.  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 Pt was acidotic post-procedure, so decision made to leave intubated, PCCM consulted in this setting.  Significant events as below.  Patient was eventually transferred under [facility] to comanage on 03/07/2024 while GYN oncology remains the primary service. Significant events Patient was admitted to the ICU postop intubated on the 7/2 extubated 7/ 4 patient transferred under [facility] for continuation of co management 7/8    Assessment/Plan:   S/p D&C procedure complicated by bowel perforation S/p partial colectomy and ileostomy  Septic shock, shock resolved  Presented for d&c on 7/1 c/b bowel perforation. OR 7/2 after development of septic shock, zosyn  started. Had ex lap with sigmoidoscopy, partial colectomy  and ileostomy placement. CT AP 7/6 showing sigmoid diverticulitis, air-fluid level 4.6cm unable to discern abscess vs segment of colon.  Leukocytosis resolved Culture with E coli and Enterococcus sensitivity pending antibiotics changed to meropenem   Continue Zyvox  patient started on TPN.  Management per primary service.   Hyperkalemia  resolved after Lokelma   Hyponatremia - Improved to 132.   Chest pain  7/6 complained of chest pressure. Trop x2 flat @ 22. EKG non ischemic. CXR with no acute findings.  Monitor on telemetry.   AKI on CKD stage IIIa: Resolved creatinine 0.84   Acute Thrombocytopenia resolved   Type 2 DM Hypoglycemia: Resolved on  TPN   Hypothyroidism  - con't synthroid  75mcg daily     Hyperlipidemia  HTN - con't zetia  10mg  daily  - hydralazine  prn for SBP > 180      Respiratory insufficiency:  Resolved Required oxygen, chest x-ray showed no atelectasis   Echocardiogram  normal ejection fraction as below.   She received a dose of Lasix  40 mg x 1 yesterday.  Chest xray- 7/10  Interval placement of right arm PICC as described. No pneumothorax.Persistent low lung volumes with mild bibasilar atelectasis.   Echo 7/10- Left ventricular ejection fraction, by estimation, is 65 to 70%. The  left ventricle has normal function. The left ventricle has no regional  wall motion abnormalities. There is mild concentric left ventricular  hypertrophy. Left ventricular diastolic  parameters are consistent with Grade I diastolic dysfunction (impaired  relaxation).  Right ventricular systolic function is normal. The right ventricular  size is normal. The mitral valve is normal in structure. No evidence of mitral valve  regurgitation. No evidence of mitral stenosis.  The aortic valve is tricuspid. There is mild calcification of the  aortic valve. Aortic valve regurgitation is not visualized. Aortic valve  sclerosis/calcification is present, without any evidence of aortic  stenosis.    Nutrition Problem: Severe Malnutrition Etiology: acute illness  Signs/Symptoms: energy intake < or equal to 50% for > or equal to 5 days, mild muscle depletion, percent weight loss (10% in 1.5 months) Percent weight loss: 10 % (in 1.5 months)    Medications     buPROPion   150 mg Oral QPM   Chlorhexidine  Gluconate Cloth  6 each  Topical Daily   enoxaparin  (LOVENOX ) injection  40 mg Subcutaneous Q24H   ezetimibe   10 mg Oral Daily   feeding supplement  237 mL Oral TID BM   gabapentin   300 mg Oral TID   insulin  aspart  0-15 Units Subcutaneous Q4H   levothyroxine   75 mcg Oral QAC breakfast   lidocaine   1 patch Transdermal Q24H    linagliptin   5 mg Oral Daily   linezolid   600 mg Oral Q12H   melatonin  3 mg Oral QHS   multivitamin with minerals  1 tablet Oral Daily   pantoprazole   40 mg Oral QHS   polyethylene glycol  17 g Oral Daily   sodium chloride  flush  10-40 mL Intracatheter Q12H   sodium chloride  flush  10-40 mL Intracatheter Q12H     Data Reviewed:   CBG:  Recent Labs  Lab 03/15/24 1616 03/15/24 2014 03/16/24 0009 03/16/24 0418 03/16/24 0731  GLUCAP 207* 176* 132* 165* 148*    SpO2: 96 % O2 Flow Rate (L/min): 2 L/min FiO2 (%): 30 %    Vitals:   03/15/24 1238 03/15/24 2011 03/16/24 0416 03/16/24 0500  BP: (!) 143/96 (!) 146/75 126/74   Pulse: 93 96 92   Resp: 20 18 18    Temp: 98.4 F (36.9 C) 98.6 F (37 C) 98.6 F (37 C)   TempSrc: Oral Oral Oral   SpO2: 97% 97% 96%   Weight:    76.5 kg  Height:          Data Reviewed:  Basic Metabolic Panel: Recent Labs  Lab 03/10/24 0600 03/11/24 0549 03/12/24 0546 03/13/24 0518 03/14/24 0539 03/15/24 0427 03/16/24 0438  NA 129*   < > 131* 130* 127* 128* 132*  K 4.7   < > 5.2* 4.7 4.0 4.0 3.9  CL 98   < > 100 97* 98 97* 100  CO2 24   < > 22 22 23 23 23   GLUCOSE 137*   < > 177* 218* 204* 202* 178*  BUN 23   < > 26* 27* 27* 27* 25*  CREATININE 1.01*   < > 0.96 0.80 0.82 0.78 0.89  CALCIUM  7.9*   < > 8.9 8.8* 8.1* 8.3* 8.6*  MG 1.7  --  1.7 1.8  --  1.8 2.0  PHOS 3.7  --  3.5 3.8  --  3.1 3.3   < > = values in this interval not displayed.    CBC: Recent Labs  Lab 03/12/24 0546 03/13/24 1040 03/14/24 0539 03/15/24 0427 03/16/24 0438  WBC 8.2 8.5 8.5 8.7 8.6  HGB 8.8* 9.0* 8.2* 7.9* 7.8*  HCT 26.3* 27.0* 24.6* 23.4* 23.5*  MCV 88.9 89.4 89.8 89.3 90.4  PLT 241 331 350 362 384    LFT Recent Labs  Lab 03/13/24 0518 03/16/24 0438  AST 90* 48*  ALT 118* 86*  ALKPHOS 144* 126  BILITOT 0.5 0.7  PROT 5.6* 6.3*  ALBUMIN  1.8* 1.8*     Antibiotics: Anti-infectives (From admission, onward)    Start     Dose/Rate  Route Frequency Ordered Stop   03/14/24 1000  linezolid  (ZYVOX ) tablet 600 mg        600 mg Oral Every 12 hours 03/14/24 0903     03/14/24 0930  piperacillin -tazobactam (ZOSYN ) IVPB 3.375 g        3.375 g 12.5 mL/hr over 240 Minutes Intravenous Every 8 hours 03/14/24 0833     03/13/24 1300  linezolid  (ZYVOX ) IVPB 600 mg  Status:  Discontinued        600 mg 300 mL/hr over 60 Minutes Intravenous Every 12 hours 03/13/24 1200 03/14/24 0903   03/13/24 1300  ceFEPIme  (MAXIPIME ) 2 g in sodium chloride  0.9 % 100 mL IVPB  Status:  Discontinued        2 g 200 mL/hr over 30 Minutes Intravenous Every 8 hours 03/13/24 1200 03/14/24 0833   03/10/24 1200  amoxicillin -clavulanate (AUGMENTIN ) 875-125 MG per tablet 1 tablet  Status:  Discontinued        1 tablet Oral Every 12 hours 03/10/24 1114 03/13/24 1200   03/01/24 1200  piperacillin -tazobactam (ZOSYN ) IVPB 3.375 g  Status:  Discontinued        3.375 g 12.5 mL/hr over 240 Minutes Intravenous Every 8 hours 03/01/24 0946 03/01/24 1116   03/01/24 1130  piperacillin -tazobactam (ZOSYN ) IVPB 3.375 g  Status:  Discontinued        3.375 g 12.5 mL/hr over 240 Minutes Intravenous Every 8 hours 03/01/24 1116 03/10/24 1114          Subjective    Complains of pain after dressing change  Objective    Physical Examination:  General-appears in no acute distress Heart-S1-S2, regular, no murmur auscultated Lungs-clear to auscultation bilaterally, no wheezing or crackles auscultated Abdomen-soft, nontender, no organomegaly Extremities-no edema in the lower extremities Neuro-alert, oriented x3, no focal deficit noted.   Status is: Inpatient:             Sabas GORMAN Brod   [facility] If 7PM-7AM, please contact night-coverage at [email or web address], Office  [phone]   03/16/2024, 8:43 AM  LOS: 16 days

## 2024-03-16 NOTE — Consult Note (Addendum)
 WOC Nurse Wound Consult Note:  Vac change to full thickness post-op wound to midline abd, small amt cloudy tan drainage in the cannister. Midline abd full thickness post-op wound is 90% beefy red, 10% yellow, undermining/tunneling area to 3:00 o'clock with some tan drainage, depth is slightly decreased to 4 cm when probed with a swab, no odor. 12X4X5 cm. Pt was medicated for pain prior to the procedure and tolerated with mod amt discomfort. Applied one piece black foam to cont suction. Applied barrier ring to lower edges to attempt to maintain a seal.  WOC team will plan to change dressing again on Mon.    Discussed plan of care with surgical PA and the following orders have been provided for the bedside nurses: If Vac drainage is too thick to maintain suction and machine is reading blockage, remove Vac and apply moist gauze dressing and ABD pad and tape.    WOC Nurse ostomy follow up Applied barrier ring and one piece convex pouch to attempt to maintain a seal, Pt states she is feeling poorly and did not watch or participate in the pouch change. No family at the bedside.  Stoma  80% yellow. 20% red  flush with skin level.  1 1/2 inches. Mucutaneous separation at 9:00 o'clock to area approx .2X.2X.1cm, dark red and dry. Full thickness puncture wound from previous procedure is located directly above, .2X.2X.1cm, red and dry.  Applied barrier ring and one piece convex pouch.  50cc brown liquid stool in the pouch. Educational materials and 3 sets of supplies at the the bedside for staff nurses' use: Use Supplies: barrier rings, [place] # [id number], convex pouches [place]le[place] # [phone] Enrolled patient in [place] Secure Start Discharge program: Yes, previously WOC team will perform pouch change again on Mon.    Pt will need total assistance with pouch changes and emptying unless she begins to participate.    Thank-you,  Stephane Fought MSN, RN, CWOCN, CWCN-AP, CNS Contact Mon-Fri 0700-1500: [phone]

## 2024-03-16 NOTE — Progress Notes (Addendum)
 15 Days Post-Op Procedure(s) (LRB): LAPAROTOMY, EXPLORATORY (N/A) SIGMOIDOSCOPY COLECTOMY, WITH COLOSTOMY CREATION MOBILIZATION, SPLENIC FLEXURE, WITH PARTIAL COLECTOMY CYSTOSCOPY  Subjective: Patient reports having moderate abdominal soreness/pain that has remained since the am wound VAC dressing change. She ate some bites of oatmeal, pancakes this am. Continues to report feeling full. No nausea or emesis. States she was told to back off on frequency of drinking ensures since it was giving her diarrhea. Denies chest pain, dyspnea.  Objective: Vital signs in last 24 hours: Temp:  [98.4 F (36.9 C)-98.6 F (37 C)] 98.6 F (37 C) (07/17 0416) Pulse Rate:  [92-96] 92 (07/17 0416) Resp:  [18-20] 18 (07/17 0416) BP: (126-146)/(74-96) 126/74 (07/17 0416) SpO2:  [96 %-97 %] 96 % (07/17 0416) Weight:  [168 lb 10.4 oz (76.5 kg)] 168 lb 10.4 oz (76.5 kg) (07/17 0500) Last BM Date : 03/13/24  Intake/Output from previous day: 07/16 0701 - 07/17 0700 In: 1105.2 [I.V.:954.5; IV Piggyback:150.7] Out: 1360 [Urine:1250; Drains:60; Stool:50]  Physical Examination: General: Alert, oriented, no acute distress but grimacing intermittently due to pain, in bed Respiratory: Normal work of breathing. Lungs clear to auscultation bilaterally without rhonchi. Cardiovascular: Regular sinus rhythm and rate, no murmurs or rubs. Abdomen: soft, nondistended, + BS, tender to palpation diffusely (stable). Incision: wound VAC in place to suction. JP drain with clear serous fluid in tubing. Dry dressing around drain. Ostomy with soft stool, flatus.    Extremities: extremities warm and well perfused with trace edema, SCDs in place Gu: Purewick in place with clear yellow urine  Labs:    Latest Ref Rng & Units 03/16/2024    4:38 AM 03/15/2024    4:27 AM 03/14/2024    5:39 AM  CBC  WBC 4.0 - 10.5 K/uL 8.6  8.7  8.5   Hemoglobin 12.0 - 15.0 g/dL 7.8  7.9  8.2   Hematocrit 36.0 - 46.0 % 23.5  23.4  24.6   Platelets  150 - 400 K/uL 384  362  350       Latest Ref Rng & Units 03/16/2024    4:38 AM 03/15/2024    4:27 AM 03/14/2024    5:39 AM  BMP  Glucose 70 - 99 mg/dL 821  797  795   BUN 8 - 23 mg/dL 25  27  27    Creatinine 0.44 - 1.00 mg/dL 9.10  9.21  9.17   Sodium 135 - 145 mmol/L 132  128  127   Potassium 3.5 - 5.1 mmol/L 3.9  4.0  4.0   Chloride 98 - 111 mmol/L 100  97  98   CO2 22 - 32 mmol/L 23  23  23    Calcium  8.9 - 10.3 mg/dL 8.6  8.3  8.1    Assessment: 78 y.o. s/p Procedure(s): LAPAROTOMY, EXPLORATORY SIGMOIDOSCOPY COLECTOMY, WITH COLOSTOMY CREATION MOBILIZATION, SPLENIC FLEXURE, WITH PARTIAL COLECTOMY CYSTOSCOPY.  No known gyn cancer diagnosis at this time. Endometrial tissue had not been obtained with D&C procedures.  Post-Op: Overall working on post-op milestones. Sounds like patient ate more this am. Will discuss TPN continuation with Dr. Viktoria. No diet tickets or intake available for review from yesterday. Having ostomy output, continue charting ostomy output. Encouraged increasing mobility with assist. Excellent UOP.   Per Dr. [name], when patient is discharged, JP drain can be removed and will need to have a wick placed in the opening to promote drainage.   Leukocytosis: WBC 8.6 this am. Purulent drainage from around JP drain site - culture with few/rare  e coli and enterococcus - susceptibilities returned 03/13/24. Currently on zosyn  and linezolid . Per ID pharmacist, linezolid  can be changed to oral, cefazolin  added to susceptibility testing earlier in the week. Afebrile. With [facility] dressing change 03/13/24, purulent drainage noted coming from the anterior aspect of the incision-culture obtained-moderate e coli and moderate psuedomonas aeruginosa. Given sensitivities, will discuss with Dr. Viktoria about abx change.    Malnutrition: on TPN given prolonged period of NPO. Will work on weaning TPN as PO intake improves. Albumin  1.8 this am. Continue nutritional supplements. Continue calorie  count with majority of meal tickets stating patient refused food 2 days ago.   Hyponatremia: asymptomatic, Na+132 this am.   Acute on chronic anemia: asymptomatic, continue to monitor.   Type 2 diabetes mellitus: Continue insulin  sliding scale.  CBG (last 3)  Recent Labs    03/15/24 0743 03/15/24 1138 03/15/24 2014  GLUCAP 181* 220* 176*      Prophylaxis: SCDs, on lovenox   Physical deconditioning: PT ordered. Up and out of bed as able. Plan for SNF when medically stable for discharge.    LOS: 16 days    Alexandria Durham 03/16/2024, 7:02 AM

## 2024-03-16 NOTE — Progress Notes (Signed)
 PHARMACY - TOTAL PARENTERAL NUTRITION CONSULT NOTE   Indication: Prolonged ileus  Patient Measurements: Height: 5' 4 (162.6 cm) Weight: 76.5 kg (168 lb 10.4 oz) IBW/kg (Calculated) : 54.7 TPN AdjBW (KG): 60.8 Body mass index is 28.95 kg/m.  Assessment: 78 year old female s/p hysterectomy with D&C on 7/1. 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 Taken back to the OR 7/2 for ex-lap and partial colectomy and ileostomy.  Pharmacy consulted for TPN 7/7.  Glucose / Insulin : hx DM - on mSSI q4h , Tradjenta  added on 7/14 - CBGs (goal <150): 132-207 (only 2 values in the 200s); used 23 units SSI in 24 hrs, has 15 units in TPN bag  Electrolytes: Na low but improves to 132, K 3.9,  CorrCa on the upper end of range 10.36 (no Ca in TPN bag); other lytes wnl - Goal for ileus: K >4 and Mag >2  Renal: Scr <1 and stable, BUN 25  Hepatic:  AST/ALT down 48/86; Alk phos down 126; Tbili wnl  Intake / Output; MIVF:  - I/O -254 mL -UOP: 1250 ml -Drains 60 ml -Stool: 50 mL (LBM 7/16) on daily Miralax   GI Imaging: -7/6 CT A/P 4.6 cm diameter air-fluid level in the anterior pelvis in or adjacent to the sigmoid colon; this may represent an abscess or an air-fluid level within a dilated segment of colon  GI Surgeries / Procedures:  -7/2 ex lap, colectomy w/ colostomy -7/1 hysterectomy  Central access: PICC TPN start date: 7/7  Nutritional Goals: Goal TPN rate is 75 mL/hr (provides 104 g of protein and 1778 kcals per day)  RD Assessment: Estimated Needs Total Energy Estimated Needs: 1750-1900 kcals Total Protein Estimated Needs: 90-110 grams Total Fluid Estimated Needs: >/= 1.8L  Current Nutrition:  7/10: CLD  7/11: Diet Carb Modified Fluid consistency: Thin   - ensure plus high protein 237 mL TID - MVI PO daily   Plan:   - TPN rate decreased to 50 ml/hr on 03/14/24  Today, at 1800:  - Electrolytes in TPN:  Na 150 (max) mEq/L K 20  mEq/L Ca 0 Mag 7 mEq/L Phos 20 mmol/L Cl:Ac 2:1 - Continue multivitamin to PO - Continue moderate SSI q4h - Insulin  15 units in TPN - Thiamine  x 6 days completed 7/12 - CMET on 7/18 - Monitor TPN labs on Mon/Thurs and PRN   Iantha Batch, PharmD, BCPS 03/16/2024 8:14 AM

## 2024-03-16 NOTE — Progress Notes (Signed)
 Calorie Count Note  48 hour calorie count ordered.  Diet: Carb Modified Supplements: Ensure Plus High Protein TID  Subjective: Patient being followed by RD, last note 7/15. TPN remains at 13mL/hr, meet ~68% of kcal needs and 77% of protein needs.  Per attending service Gyn oncology, calorie count officially ordered this AM as patient ate a little better. She has previously been refusing most meals. Patient endorses eating all her oatmeal, a few bites of pancakes, and most of her applesauce. Notes she does better when she has someone assist her with eating, will add assistance with meals order. Has previously been consuming 2-3 Ensures a day, but now notes they are giving her diarrhea. She is agreeable to try [employer] (plant based supplement) to continue to support intake.  Intervention:  - 48 hour calorie count to start 7/17 and run through 7/19.  - Please document % intake of all foods, drinks, and nutrition supplements patient consumes on meal tickets and place in envelope on patient's door.  - If patient skips/refuses meals please document 0% for that meal.  - Discussed with RN.  - Carb Modified diet per MD.  - Assistance with all meals to promote better intake. - Trial [employer] 1.4 PO TID for a plant based nutrition supplement. Each supplement provides 455 kcal and 20 grams protein.  - TPN to continue at 67mL/hr per attending service.   - TPN management per pharmacy.    Trude Ned RD, LDN Contact via [employer].

## 2024-03-16 NOTE — Plan of Care (Signed)
  Problem: Clinical Measurements: Goal: Respiratory complications will improve Outcome: Progressing Goal: Cardiovascular complication will be avoided Outcome: Progressing   Problem: Activity: Goal: Risk for activity intolerance will decrease Outcome: Progressing   Problem: Coping: Goal: Level of anxiety will decrease Outcome: Progressing   

## 2024-03-16 NOTE — Plan of Care (Signed)
  Problem: Clinical Measurements: Goal: Ability to maintain clinical measurements within normal limits will improve Outcome: Progressing Goal: Will remain free from infection Outcome: Progressing Goal: Diagnostic test results will improve Outcome: Progressing Goal: Respiratory complications will improve Outcome: Progressing Goal: Cardiovascular complication will be avoided Outcome: Progressing   Problem: Activity: Goal: Risk for activity intolerance will decrease Outcome: Progressing   Problem: Coping: Goal: Level of anxiety will decrease Outcome: Progressing   Problem: Elimination: Goal: Will not experience complications related to bowel motility Outcome: Progressing Goal: Will not experience complications related to urinary retention Outcome: Progressing   Problem: Pain Managment: Goal: General experience of comfort will improve and/or be controlled Outcome: Progressing   Problem: Safety: Goal: Ability to remain free from injury will improve Outcome: Progressing   Problem: Skin Integrity: Goal: Risk for impaired skin integrity will decrease Outcome: Progressing   Problem: Education: Goal: Ability to describe self-care measures that may prevent or decrease complications (Diabetes Survival Skills Education) will improve Outcome: Progressing Goal: Individualized Educational Video(s) Outcome: Progressing   Problem: Coping: Goal: Ability to adjust to condition or change in health will improve Outcome: Progressing   Problem: Fluid Volume: Goal: Ability to maintain a balanced intake and output will improve Outcome: Progressing   Problem: Health Behavior/Discharge Planning: Goal: Ability to identify and utilize available resources and services will improve Outcome: Progressing Goal: Ability to manage health-related needs will improve Outcome: Progressing   Problem: Metabolic: Goal: Ability to maintain appropriate glucose levels will improve Outcome: Progressing    Problem: Nutritional: Goal: Maintenance of adequate nutrition will improve Outcome: Progressing Goal: Progress toward achieving an optimal weight will improve Outcome: Progressing   Problem: Skin Integrity: Goal: Risk for impaired skin integrity will decrease Outcome: Progressing   Problem: Tissue Perfusion: Goal: Adequacy of tissue perfusion will improve Outcome: Progressing   Problem: Education: Goal: Knowledge of the prescribed therapeutic regimen will improve Outcome: Progressing Goal: Understanding of sexual limitations or changes related to disease process or condition will improve Outcome: Progressing Goal: Individualized Educational Video(s) Outcome: Progressing   Problem: Self-Concept: Goal: Communication of feelings regarding changes in body function or appearance will improve Outcome: Progressing   Problem: Skin Integrity: Goal: Demonstration of wound healing without infection will improve Outcome: Progressing   Problem: Activity: Goal: Ability to tolerate increased activity will improve Outcome: Progressing   Problem: Respiratory: Goal: Ability to maintain a clear airway and adequate ventilation will improve Outcome: Progressing   Problem: Role Relationship: Goal: Method of communication will improve Outcome: Progressing

## 2024-03-17 ENCOUNTER — Telehealth: Payer: Self-pay | Admitting: *Deleted

## 2024-03-17 DIAGNOSIS — K631 Perforation of intestine (nontraumatic): Secondary | ICD-10-CM | POA: Diagnosis not present

## 2024-03-17 DIAGNOSIS — R9389 Abnormal findings on diagnostic imaging of other specified body structures: Secondary | ICD-10-CM | POA: Diagnosis not present

## 2024-03-17 DIAGNOSIS — N950 Postmenopausal bleeding: Secondary | ICD-10-CM | POA: Diagnosis not present

## 2024-03-17 DIAGNOSIS — M5412 Radiculopathy, cervical region: Secondary | ICD-10-CM | POA: Diagnosis not present

## 2024-03-17 LAB — COMPREHENSIVE METABOLIC PANEL WITH GFR
ALT: 85 U/L — ABNORMAL HIGH (ref 0–44)
AST: 59 U/L — ABNORMAL HIGH (ref 15–41)
Albumin: 1.8 g/dL — ABNORMAL LOW (ref 3.5–5.0)
Alkaline Phosphatase: 129 U/L — ABNORMAL HIGH (ref 38–126)
Anion gap: 8 (ref 5–15)
BUN: 24 mg/dL — ABNORMAL HIGH (ref 8–23)
CO2: 24 mmol/L (ref 22–32)
Calcium: 8.5 mg/dL — ABNORMAL LOW (ref 8.9–10.3)
Chloride: 100 mmol/L (ref 98–111)
Creatinine, Ser: 0.83 mg/dL (ref 0.44–1.00)
GFR, Estimated: >60 mL/min (ref >60)
Glucose, Bld: 186 mg/dL — ABNORMAL HIGH (ref 70–99)
Potassium: 4.2 mmol/L (ref 3.5–5.1)
Sodium: 132 mmol/L — ABNORMAL LOW (ref 135–145)
Total Bilirubin: 0.5 mg/dL (ref 0.0–1.2)
Total Protein: 6.4 g/dL — ABNORMAL LOW (ref 6.5–8.1)

## 2024-03-17 LAB — CBC
HCT: 25.1 % — ABNORMAL LOW (ref 36.0–46.0)
Hemoglobin: 8.2 g/dL — ABNORMAL LOW (ref 12.0–15.0)
MCH: 29.8 pg (ref 26.0–34.0)
MCHC: 32.7 g/dL (ref 30.0–36.0)
MCV: 91.3 fL (ref 80.0–100.0)
Platelets: 495 K/uL — ABNORMAL HIGH (ref 150–400)
RBC: 2.75 MIL/uL — ABNORMAL LOW (ref 3.87–5.11)
RDW: 17.2 % — ABNORMAL HIGH (ref 11.5–15.5)
WBC: 9 K/uL (ref 4.0–10.5)
nRBC: 0.4 % — ABNORMAL HIGH (ref 0.0–0.2)

## 2024-03-17 LAB — GLUCOSE, CAPILLARY
Glucose-Capillary: 148 mg/dL — ABNORMAL HIGH (ref 70–99)
Glucose-Capillary: 160 mg/dL — ABNORMAL HIGH (ref 70–99)
Glucose-Capillary: 164 mg/dL — ABNORMAL HIGH (ref 70–99)
Glucose-Capillary: 201 mg/dL — ABNORMAL HIGH (ref 70–99)
Glucose-Capillary: 156 mg/dL — ABNORMAL HIGH (ref 70–99)
Glucose-Capillary: 238 mg/dL — ABNORMAL HIGH (ref 70–99)

## 2024-03-17 MED ORDER — TRAVASOL 10 % IV SOLN
INTRAVENOUS | Status: AC
  Filled 2024-03-17: qty 696

## 2024-03-17 NOTE — Telephone Encounter (Signed)
 Spoke with patient's daughter [name] and relayed message from [name], NP with an update on Alexandria Durham. We are still staying the course. We are trying to monitor her intake so when adequate we can decrease the TPN. Think she is taking in more each day which is good. We will continue with the antibiotics to treat the infection in her abdomen.   [name] asked about the antibiotics her mother is on and wanted to know if she was still taking two? Advised that her mother is on two antibiotics that were based on culture results. Wound vac dressing is being changed twice a week on Mondays and Thursdays. [name] also asked about the nutritional supplemental they switched her to? Advised ensure was changed because of the lactose and the new one is plant based. [name] verbalized understanding and thanked the office for calling.

## 2024-03-17 NOTE — TOC Progression Note (Addendum)
 Transition of Care [facility]) - Progression Note    Patient Details  Name: Alexandria Durham MRN: [id number] Date of Birth: 10/24/1945  Transition of Care [facility]) CM/SW Contact  [name], RN Phone Number:[phone]  03/28/2024, 2:35 PM  Clinical Narrative:    TOC continues to follow for medical readiness for disposition planning.   1130 Per previous IP care management notes patient is from home with daughter and the plan is to return. This CM has reached out to daughter [name] to confirm. Daughter does not answer and message has been left.   1356 CM at bedside to discuss disposition plan with patient. Patient is alert and oriented.Patient states that she does plan to return home with her daughter. Patient is not able to confirm that she will have 24/7 care to support her being at home. CM has made patient aware that she will need 24/7 assistance and also assistance with wound dressing changes. CM inquires about patient being able to care for herself. Patient states that she is trying and is getting better. Patient states that she ambulated in hallway today and is eating small amounts. CM has explained that IP care manager will  follow up with daughter.    Expected Discharge Plan: Home/Self Care Barriers to Discharge: Continued Medical Work up  Expected Discharge Plan and Services In-house Referral: NA Discharge Planning Services: CM Consult Post Acute Care Choice: NA Living arrangements for the past 2 months: Single Family Home                 DME Arranged: N/A DME Agency: NA       HH Arranged: NA HH Agency: NA         Social Determinants of Health (SDOH) Interventions SDOH Screenings   Food Insecurity: No Food Insecurity (02/29/2024)  Housing: High Risk (02/29/2024)  Transportation Needs: No Transportation Needs (02/29/2024)  Utilities: Not At Risk (02/29/2024)  Recent Concern: Utilities - At Risk (02/09/2024)  Social Connections: Patient Declined (02/29/2024)  Tobacco Use:  Medium Risk (03/01/2024)    Readmission Risk Interventions    03/02/2024    3:05 PM  Readmission Risk Prevention Plan  Transportation Screening Complete  PCP or Specialist Appt within 3-5 Days Complete  HRI or Home Care Consult Complete  Social Work Consult for Recovery Care Planning/Counseling Complete  Palliative Care Screening Not Applicable  Medication Review [employer]) Complete

## 2024-03-17 NOTE — Progress Notes (Signed)
 Calorie Count Note  48 hour calorie count ordered.  Diet: Carb Modified Supplements: Mallie Pinion 1.4 TID  7/17: Breakfast: Per patient report - 100% oatmeal, a few bites of pancakes, and 90% of her applesauce = 218 kcals, 5g protein Lunch: per patient report - few bites of pot roast, 100% mashed potatoes, 100% carrots = 192 kcals, 7g protein Dinner: 0% (pt reports she was too full) Supplements: 2 [employer] = 910 kcals, 40g protein  Total intake: 1320 kcal (75% of minimum estimated needs)  52 protein (58% of minimum estimated needs)  Nutrition Dx: Severe Malnutrition related to acute illness as evidenced by energy intake < or equal to 50% for > or equal to 5 days, mild muscle depletion, percent weight loss (10% in 1.5 months).   Goal: Patient will meet greater than or equal to 90% of their needs   Intervention:  - 48 hour calorie count to continue through 7/19.             - Please document % intake of all foods, drinks, and nutrition supplements patient consumes on meal tickets and place in envelope on patient's door.             - If patient skips/refuses meals please document 0% for that meal.             - Discussed with RN.   - Carb Modified diet per MD.             - Assistance with all meals to promote better intake. - Continue [employer] 1.4 PO TID for a plant based nutrition supplement. Each supplement provides 455 kcal and 20 grams protein.   - TPN to continue at 23mL/hr per attending service.              - TPN management per pharmacy.    Trude Ned RD, LDN Contact via [employer].

## 2024-03-17 NOTE — Progress Notes (Signed)
 16 Days Post-Op Procedure(s) (LRB): LAPAROTOMY, EXPLORATORY (N/A) SIGMOIDOSCOPY COLECTOMY, WITH COLOSTOMY CREATION MOBILIZATION, SPLENIC FLEXURE, WITH PARTIAL COLECTOMY CYSTOSCOPY  Subjective: Patient reports having a rough night due to IV pump beeping frequently. Patient reports eating some bites of mashed potatoes and carrots yesterday afternoon. No nausea or emesis. Having moderate abdominal pain around 8-9 on pain scale at this time. Reports not getting out of bed yesterday due to severe abdominal pain experienced after wound vac dressing change. Has been trying to sip on nutritional shakes.  Objective: Vital signs in last 24 hours: Temp:  [98.2 F (36.8 C)-98.7 F (37.1 C)] 98.2 F (36.8 C) (07/18 0455) Pulse Rate:  [94-101] 96 (07/18 0455) Resp:  [18-20] 18 (07/18 0455) BP: (138-156)/(66-88) 146/88 (07/18 0455) SpO2:  [94 %-100 %] 98 % (07/18 0455) Last BM Date : 03/13/24  Intake/Output from previous day: 07/17 0701 - 07/18 0700 In: 1011.4 [P.O.:120; I.V.:731.4; IV Piggyback:150] Out: 2550 [Urine:2350; Drains:150; Stool:50]  Physical Examination: General: Sleepy, oriented when awake, no acute distress, in bed Respiratory: Normal work of breathing. Lungs clear to auscultation bilaterally without rhonchi. Cardiovascular: Regular sinus rhythm and rate, no murmurs or rubs. Abdomen: soft, mildly distended, + BS, tender to palpation diffusely (stable). Incision: wound VAC in place to suction. JP drain with clear serous fluid in tubing, bulb empty. Dry dressing around drain. Ostomy with soft stool, no flatus.    Extremities: extremities warm and well perfused with trace edema, SCDs in place Gu: Purewick in place with clear yellow urine  Labs:    Latest Ref Rng & Units 03/17/2024    7:11 AM 03/16/2024    4:38 AM 03/15/2024    4:27 AM  CBC  WBC 4.0 - 10.5 K/uL 9.0  8.6  8.7   Hemoglobin 12.0 - 15.0 g/dL 8.2  7.8  7.9   Hematocrit 36.0 - 46.0 % 25.1  23.5  23.4   Platelets 150 -  400 K/uL 495  384  362       Latest Ref Rng & Units 03/17/2024    7:11 AM 03/16/2024    4:38 AM 03/15/2024    4:27 AM  BMP  Glucose 70 - 99 mg/dL 813  821  797   BUN 8 - 23 mg/dL 24  25  27    Creatinine 0.44 - 1.00 mg/dL 9.16  9.10  9.21   Sodium 135 - 145 mmol/L 132  132  128   Potassium 3.5 - 5.1 mmol/L 4.2  3.9  4.0   Chloride 98 - 111 mmol/L 100  100  97   CO2 22 - 32 mmol/L 24  23  23    Calcium  8.9 - 10.3 mg/dL 8.5  8.6  8.3    Assessment: 78 y.o. s/p Procedure(s): LAPAROTOMY, EXPLORATORY SIGMOIDOSCOPY COLECTOMY, WITH COLOSTOMY CREATION MOBILIZATION, SPLENIC FLEXURE, WITH PARTIAL COLECTOMY CYSTOSCOPY.  No known gyn cancer diagnosis at this time. Endometrial tissue has not been obtained with D&C procedures performed due to thickened endometrium.  Post-Op: Overall working on post-op milestones. Unfortunately, no meal tickets to review for lunch yesterday and dinner last pm. No oral intake in I&Os. Having ostomy output, not measured. Encouraged increasing mobility with assist. Excellent UOP.   Per Dr. [name], when patient is discharged, JP drain can be removed and will need to have a wick placed in the opening to promote drainage.   Leukocytosis: WBC 9.0 this am. Purulent drainage from around JP drain site - culture with few/rare e coli and enterococcus - susceptibilities returned 03/13/24.  Currently on zosyn  and linezolid . Per ID pharmacist, linezolid  can be changed to oral, cefazolin  added to susceptibility testing earlier in the week. Afebrile. With [facility] dressing change 03/13/24, purulent drainage noted coming from the anterior aspect of the incision-culture obtained-moderate e coli and moderate psuedomonas aeruginosa. Given sensitivities, abx changed to meropenem  on 03/16/24 and on zyvox .    Malnutrition: on TPN given prolonged period of NPO. Will work on weaning TPN as PO intake improves. Albumin  1.8 this am. Continue nutritional supplements. Continue calorie count.   Hyponatremia:  asymptomatic, Na+132 this am.   Acute on chronic anemia: asymptomatic, continue to monitor.   Type 2 diabetes mellitus: Continue insulin  sliding scale.  CBG (last 3)  Recent Labs    03/17/24 0025 03/17/24 0458 03/17/24 0730  GLUCAP 164* 201* 160*      Prophylaxis: SCDs, on lovenox   Physical deconditioning: PT ordered. Up and out of bed as able. Plan for SNF when medically stable for discharge.    LOS: 17 days    Alexandria Durham 03/17/2024, 8:38 AM

## 2024-03-17 NOTE — Progress Notes (Signed)
 PHARMACY - TOTAL PARENTERAL NUTRITION CONSULT NOTE   Indication: Prolonged ileus  Patient Measurements: Height: 5' 4 (162.6 cm) Weight: 76.5 kg (168 lb 10.4 oz) IBW/kg (Calculated) : 54.7 TPN AdjBW (KG): 60.8 Body mass index is 28.95 kg/m.  Assessment: 78 year old female s/p hysterectomy with D&C on 7/1. 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 Taken back to the OR 7/2 for ex-lap and partial colectomy and ileostomy.  Pharmacy consulted for TPN 7/7.  Glucose / Insulin : hx DM - on mSSI q4h , Tradjenta  added on 7/14 - CBGs (goal <150): 148-208 (3 values in the 200s); used 23 units SSI in 24 hrs, has 15 units in TPN bag  Electrolytes: Na low but stable at 132, K up to 4.2,  CorrCa on the upper end of range 10.36 (no Ca in TPN bag); other lytes wnl - Goal for ileus: K >4 and Mag >2  Renal: Scr <1 and stable, BUN 24  Hepatic:  AST/ALT  at 59/85; Alk phos slightly up to 129; Tbili wnl  Intake / Output; MIVF:  - I/O -1538 mL -UOP: 2350 ml -Drains 150 ml -Stool: 50 mL (LBM 7/16) on daily Miralax   GI Imaging: -7/6 CT A/P 4.6 cm diameter air-fluid level in the anterior pelvis in or adjacent to the sigmoid colon; this may represent an abscess or an air-fluid level within a dilated segment of colon  GI Surgeries / Procedures:  -7/2 ex lap, colectomy w/ colostomy -7/1 hysterectomy  Central access: PICC TPN start date: 7/7  Nutritional Goals: Goal TPN rate is 75 mL/hr (provides 104 g of protein and 1778 kcals per day)  RD Assessment: Estimated Needs Total Energy Estimated Needs: 1750-1900 kcals Total Protein Estimated Needs: 90-110 grams Total Fluid Estimated Needs: >/= 1.8L  Current Nutrition:  7/10: CLD  7/11: Diet Carb Modified Fluid consistency: Thin   - [employer] 325 mL TID - MVI PO daily   Plan:   - TPN rate decreased to 50 ml/hr on 03/14/24  Today, at 1800:  - Electrolytes in TPN:  Na 150 (max) mEq/L K 20 mEq/L Ca  0 Mag 7 mEq/L Phos 20 mmol/L Cl:Ac 2:1 - Continue multivitamin PO - Continue moderate SSI q4h - Increase insulin  to 20 units in TPN - Thiamine  x 6 days completed 7/12 - BMP, magnesium , phosphorus with AM labs  - Monitor TPN labs on Mon/Thurs and PRN   Yeny Schmoll, PharmD, BCPS 03/17/2024 10:36 AM

## 2024-03-17 NOTE — Progress Notes (Signed)
 [name]  PROGRESS NOTE  Alexandria Durham FMW:[id number] DOB: 08/21/1946 DOA: 02/29/2024 PCP: [name], NP   Brief HPI:   78 y.o.  F with PMH significant for thickened endometrium, HTN, Type 2 DM, CKD stage IIIa, recent pancreatitis and acute cholecystitis s/p lap chole and D&C in May of this year who was admitted 7/1 for hysteroscopy and repeat D&C.  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 Pt was acidotic post-procedure, so decision made to leave intubated, PCCM consulted in this setting.  Significant events as below.  Patient was eventually transferred under [facility] to comanage on 03/07/2024 while GYN oncology remains the primary service. Significant events Patient was admitted to the ICU postop intubated on the 7/2 extubated 7/ 4 patient transferred under [facility] for continuation of co management 7/8    Assessment/Plan:   S/p D&C procedure complicated by bowel perforation S/p partial colectomy and ileostomy  Septic shock, shock resolved  Presented for d&c on 7/1 c/b bowel perforation. OR 7/2 after development of septic shock, zosyn  started. Had ex lap with sigmoidoscopy, partial colectomy  and ileostomy placement. CT AP 7/6 showing sigmoid diverticulitis, air-fluid level 4.6cm unable to discern abscess vs segment of colon.  Leukocytosis resolved Culture with E coli and Enterococcus sensitivity pending antibiotics changed to meropenem   Continue Zyvox  patient started on TPN.  Management per primary service.   Hyperkalemia  resolved after Lokelma   Hyponatremia - Improved to 132.   Chest pain  7/6 complained of chest pressure. Trop x2 flat @ 22. EKG non ischemic. CXR with no acute findings.  Monitor on telemetry.   AKI on CKD stage IIIa: Resolved creatinine 0.84   Acute Thrombocytopenia resolved   Type 2 DM Hypoglycemia: Resolved on  TPN   Hypothyroidism  - con't synthroid  75mcg daily     Hyperlipidemia  HTN - con't zetia  10mg  daily  - hydralazine  prn for SBP > 180      Respiratory insufficiency:  Resolved Required oxygen, chest x-ray showed no atelectasis   Echocardiogram  normal ejection fraction as below.   She received a dose of Lasix  40 mg x 1 yesterday.  Chest xray- 7/10  Interval placement of right arm PICC as described. No pneumothorax.Persistent low lung volumes with mild bibasilar atelectasis.   Echo 7/10- Left ventricular ejection fraction, by estimation, is 65 to 70%. The  left ventricle has normal function. The left ventricle has no regional  wall motion abnormalities. There is mild concentric left ventricular  hypertrophy. Left ventricular diastolic  parameters are consistent with Grade I diastolic dysfunction (impaired  relaxation).  Right ventricular systolic function is normal. The right ventricular  size is normal. The mitral valve is normal in structure. No evidence of mitral valve  regurgitation. No evidence of mitral stenosis.  The aortic valve is tricuspid. There is mild calcification of the  aortic valve. Aortic valve regurgitation is not visualized. Aortic valve  sclerosis/calcification is present, without any evidence of aortic  stenosis.    Nutrition Problem: Severe Malnutrition Etiology: acute illness  Signs/Symptoms: energy intake < or equal to 50% for > or equal to 5 days, mild muscle depletion, percent weight loss (10% in 1.5 months) Percent weight loss: 10 % (in 1.5 months)    Medications     buPROPion   150 mg Oral QPM   Chlorhexidine  Gluconate Cloth  6 each  Topical Daily   enoxaparin  (LOVENOX ) injection  40 mg Subcutaneous Q24H   ezetimibe   10 mg Oral Daily   feeding supplement (KATE FARMS STANDARD 1.4)  325 mL Oral TID BM   gabapentin   300 mg Oral TID   insulin  aspart  0-15 Units Subcutaneous Q4H   levothyroxine   75 mcg Oral QAC breakfast   lidocaine   1 patch  Transdermal Q24H   linagliptin   5 mg Oral Daily   linezolid   600 mg Oral Q12H   melatonin  3 mg Oral QHS   multivitamin with minerals  1 tablet Oral Daily   pantoprazole   40 mg Oral QHS   polyethylene glycol  17 g Oral Daily   sodium chloride  flush  10-40 mL Intracatheter Q12H   sodium chloride  flush  10-40 mL Intracatheter Q12H     Data Reviewed:   CBG:  Recent Labs  Lab 03/16/24 1620 03/16/24 2002 03/17/24 0025 03/17/24 0458 03/17/24 0730  GLUCAP 201* 148* 164* 201* 160*    SpO2: 98 % O2 Flow Rate (L/min): 2 L/min FiO2 (%): 30 %    Vitals:   03/16/24 0500 03/16/24 1409 03/16/24 2000 03/17/24 0455  BP:  138/66 (!) 156/85 (!) 146/88  Pulse:  94 (!) 101 96  Resp:  20 18 18   Temp:  98.4 F (36.9 C) 98.7 F (37.1 C) 98.2 F (36.8 C)  TempSrc:  Oral Oral Oral  SpO2:  94% 100% 98%  Weight: 76.5 kg     Height:          Data Reviewed:  Basic Metabolic Panel: Recent Labs  Lab 03/12/24 0546 03/13/24 0518 03/14/24 0539 03/15/24 0427 03/16/24 0438 03/17/24 0711  NA 131* 130* 127* 128* 132* 132*  K 5.2* 4.7 4.0 4.0 3.9 4.2  CL 100 97* 98 97* 100 100  CO2 22 22 23 23 23 24   GLUCOSE 177* 218* 204* 202* 178* 186*  BUN 26* 27* 27* 27* 25* 24*  CREATININE 0.96 0.80 0.82 0.78 0.89 0.83  CALCIUM  8.9 8.8* 8.1* 8.3* 8.6* 8.5*  MG 1.7 1.8  --  1.8 2.0  --   PHOS 3.5 3.8  --  3.1 3.3  --     CBC: Recent Labs  Lab 03/13/24 1040 03/14/24 0539 03/15/24 0427 03/16/24 0438 03/17/24 0711  WBC 8.5 8.5 8.7 8.6 9.0  HGB 9.0* 8.2* 7.9* 7.8* 8.2*  HCT 27.0* 24.6* 23.4* 23.5* 25.1*  MCV 89.4 89.8 89.3 90.4 91.3  PLT 331 350 362 384 495*    LFT Recent Labs  Lab 03/13/24 0518 03/16/24 0438 03/17/24 0711  AST 90* 48* 59*  ALT 118* 86* 85*  ALKPHOS 144* 126 129*  BILITOT 0.5 0.7 0.5  PROT 5.6* 6.3* 6.4*  ALBUMIN  1.8* 1.8* 1.8*     Antibiotics: Anti-infectives (From admission, onward)    Start     Dose/Rate Route Frequency Ordered Stop   03/16/24 1230   meropenem  (MERREM ) 1 g in sodium chloride  0.9 % 100 mL IVPB        1 g 200 mL/hr over 30 Minutes Intravenous Every 8 hours 03/16/24 1134     03/14/24 1000  linezolid  (ZYVOX ) tablet 600 mg        600 mg Oral Every 12 hours 03/14/24 0903     03/14/24 0930  piperacillin -tazobactam (ZOSYN ) IVPB 3.375 g  Status:  Discontinued        3.375 g 12.5 mL/hr over 240 Minutes Intravenous Every 8 hours 03/14/24 0833 03/16/24 1134  03/13/24 1300  linezolid  (ZYVOX ) IVPB 600 mg  Status:  Discontinued        600 mg 300 mL/hr over 60 Minutes Intravenous Every 12 hours 03/13/24 1200 03/14/24 0903   03/13/24 1300  ceFEPIme  (MAXIPIME ) 2 g in sodium chloride  0.9 % 100 mL IVPB  Status:  Discontinued        2 g 200 mL/hr over 30 Minutes Intravenous Every 8 hours 03/13/24 1200 03/14/24 0833   03/10/24 1200  amoxicillin -clavulanate (AUGMENTIN ) 875-125 MG per tablet 1 tablet  Status:  Discontinued        1 tablet Oral Every 12 hours 03/10/24 1114 03/13/24 1200   03/01/24 1200  piperacillin -tazobactam (ZOSYN ) IVPB 3.375 g  Status:  Discontinued        3.375 g 12.5 mL/hr over 240 Minutes Intravenous Every 8 hours 03/01/24 0946 03/01/24 1116   03/01/24 1130  piperacillin -tazobactam (ZOSYN ) IVPB 3.375 g  Status:  Discontinued        3.375 g 12.5 mL/hr over 240 Minutes Intravenous Every 8 hours 03/01/24 1116 03/10/24 1114          Subjective   Denies any complaints  Objective    Physical Examination:  General-appears in no acute distress Heart-S1-S2, regular, no murmur auscultated Lungs-clear to auscultation bilaterally, no wheezing or crackles auscultated Abdomen-soft, nontender, no organomegaly Extremities-no edema in the lower extremities Neuro-alert, oriented x3, no focal deficit noted   Status is: Inpatient:             Sabas GORMAN Brod   [facility] If 7PM-7AM, please contact night-coverage at [email or web address], Office  [phone]   03/17/2024, 8:26 AM  LOS: 17 days

## 2024-03-17 NOTE — Progress Notes (Signed)
 Occupational Therapy Treatment Patient Details Name: Alexandria Durham MRN: [id number] DOB: 01/31/1946 Today's Date: 03/17/2024   History of present illness Patient is a 78 year old female who presented on 03/01/2024 following a D and C on 7/1 for thickened endometrium and procedure complicated due to bowel perforation. Pt developed signs and symptoms of sepsis and shock. Pt underwent exploratory-laparotomy, partial colectomy and ileostomy on 7/2.  Pt required post-op ventilation and extubated on 7/4.  Pt PMH includes but is not limited to:  recent hospital admissions for diverticulitis, pancreatitis, abdominal pain, D&C  01/06/24 with uterine perforation, cholecystectomy and d&C on 01/17/24, DM II, HTN, COPD, GERD and CKD III.   OT comments  The pt was seen for functional strengthening and progression of functional activity. She required assist to transfer into sitting edge of bed, being instructed on the log roll technique, for abdominal protection. She was noted to demo posterior guarding and slight lean EOB, due to abdominal pain. She then needed min assist x2 to stand from the elevated bed surface using a RW. She was assisted with stepping to the bedside chair. Her movements were intermittently slower and guarded, due to pain. Continue OT plan of care to facilitate improved ADL performance and to decrease the risk for further weakness and deconditioning. Patient will benefit from continued inpatient follow up therapy, <3 hours/day.       If plan is discharge home, recommend the following:  Assistance with cooking/housework;Assist for transportation;Help with stairs or ramp for entrance;A lot of help with bathing/dressing/bathroom;A lot of help with walking and/or transfers   Equipment Recommendations  Other (comment) (defer to next level of care)    Recommendations for Other Services      Precautions / Restrictions Precautions Precautions: Fall Restrictions Other Position/Activity Restrictions:  wound vac, JP drain, ostomy       Mobility Bed Mobility Overal bed mobility: Needs Assistance Bed Mobility: Supine to Sit     Supine to sit: Mod assist, +2 for physical assistance     General bed mobility comments: pt guided through log roll technque for abdominal precuations with min A to roll to the R and extensive assist and cues for to complete supine to sit EOB, pt able to scoot anteriorly with min A x 2 and cues, sitting balance EOB with B LE on floor with mod A and progressing to CGA with posterior lean due to abdominal pain    Transfers Overall transfer level: Needs assistance Equipment used: Rolling walker (2 wheels) Transfers: Sit to/from Stand, Bed to chair/wheelchair/BSC Sit to Stand: +2 safety/equipment, +2 physical assistance, Min assist, From elevated surface Stand pivot transfers: Min assist, +2 physical assistance, +2 safety/equipment, From elevated surface         General transfer comment: cues for trunk flexion and power up, min A x 2 for safety, stability cues and pt required increased time with shuffling like pattern for LE advacement required for pivoting to recliner. Pt able to reach back with R UE for improved eccentric control to sitting surface.    Balance     Sitting balance-Leahy Scale: Fair       Standing balance-Leahy Scale: Poor            ADL either performed or assessed with clinical judgement   ADL Overall ADL's : Needs assistance/impaired               Lower Body Bathing Details (indicate cue type and reason): to donn socks seated EOB Upper Body Dressing :  Moderate assistance;Sitting   Lower Body Dressing: Total assistance;Sitting/lateral leans Lower Body Dressing Details (indicate cue type and reason): to donn socks seated EOB                     Communication Communication Communication: No apparent difficulties   Cognition Arousal: Alert Behavior During Therapy: WFL for tasks assessed/performed         Following commands impaired: Follows one step commands with increased time, Only follows one step commands consistently                    Pertinent Vitals/ Pain       Pain Assessment Pain Assessment: 0-10 Pain Score: 7  Pain Location: abdomen and L dorsal and lateral asspect of foot Pain Descriptors / Indicators: Guarding, Grimacing, Moaning Pain Intervention(s): Limited activity within patient's tolerance, Monitored during session, Repositioned   Frequency  Min 2X/week        Progress Toward Goals  OT Goals(current goals can now be found in the care plan section)     Acute Rehab OT Goals Patient Stated Goal: decreased pain and to get better OT Goal Formulation: With patient Time For Goal Achievement: 03/22/24 Potential to Achieve Goals: Good  Plan      Co-evaluation    PT/OT/SLP Co-Evaluation/Treatment: Yes Reason for Co-Treatment: Complexity of the patient's impairments (multi-system involvement);For patient/therapist safety;To address functional/ADL transfers PT goals addressed during session: Mobility/safety with mobility;Balance;Proper use of DME OT goals addressed during session: ADL's and self-care;Proper use of Adaptive equipment and DME      AM-PAC OT 6 Clicks Daily Activity     Outcome Measure   Help from another person eating meals?: None Help from another person taking care of personal grooming?: A Little Help from another person toileting, which includes using toliet, bedpan, or urinal?: A Lot Help from another person bathing (including washing, rinsing, drying)?: A Lot Help from another person to put on and taking off regular upper body clothing?: A Lot Help from another person to put on and taking off regular lower body clothing?: Total 6 Click Score: 14    End of Session Equipment Utilized During Treatment: Gait belt;Rolling walker (2 wheels)  OT Visit Diagnosis: Unsteadiness on feet (R26.81);Muscle weakness (generalized)  (M62.81);Pain;Other abnormalities of gait and mobility (R26.89) Pain - part of body:  (abdomen)   Activity Tolerance Patient limited by pain   Patient Left in chair;with call bell/phone within reach;with chair alarm set   Nurse Communication Mobility status        Time: 8855-8789 OT Time Calculation (min): 26 min  Charges: OT General Charges $OT Visit: 1 Visit OT Treatments $Therapeutic Activity: 8-22 mins      Delanna LITTIE Molt, OTR/L 03/17/2024, 4:54 PM

## 2024-03-17 NOTE — Progress Notes (Signed)
 GYN Oncology Progress Note  Patient is alert, oriented, resting in bed. Got to chair with PT today. Did not eat breakfast or lunch but has had two nutritional shakes today. Ostomy with stool and flatus per pt. No needs voiced at this time. Continue with current plan of care. Hope to decrease TPN rate over the weekend with increased intake. No needs voiced at this time.

## 2024-03-17 NOTE — Progress Notes (Signed)
 Physical Therapy Treatment Patient Details Name: Alexandria Durham MRN: [id number] DOB: [birth year] Today's Date: 03/17/2024   History of Present Illness Patient is a 78 year old female who presented on 03/01/2024 following a D and C on 7/1 for thickened endometrium and procedure complicated due to bowel perforation. Pt developed signs and symptoms of sepsis and shock. 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  Pt required post op ventilation and extubated on 7/4.  Pt PMH includes but is not limited to:  recent hospital admissions for diverticulitis, pancreatitis, abdominal pain, D&C  01/06/24 with uterine perforation, cholecystectomy and d&C on 01/17/24, DM II, HTN, COPD, GERD and CKD III.  Clarification 03/17/2024 abdominal binder d/c per Alexandria Epps, NP.   PT Comments   Pt admitted with above diagnosis.  Pt currently with functional limitations due to the deficits listed below (see PT Problem List). Pt in bed when therapist arrived. Pt required encouragement to participate with therapy today. Pt indicated she was tired and in pain abdomen and L foot- dorsal surface and lateral aspect no noted edema or discoloration with pain to light touch. Pt required max A x 2 for supine to sit, min A x 2 for sit to stand  from EOB, min A x 2 to safely complete SPT with RW bed to recliner. Pt left in bed and all needs in place and encouraged to sit up for 1.5 hrs, nurse tech aware.  Patient will benefit from continued inpatient follow up therapy, <3 hours/day.  Pt will benefit from acute skilled PT to increase their independence and safety with mobility to allow discharge.      If plan is discharge home, recommend the following: Two people to help with walking and/or transfers;A lot of help with bathing/dressing/bathroom;Assistance with cooking/housework;Assist for transportation   Can travel by private vehicle     No  Equipment Recommendations  Wheelchair cushion (measurements PT);Wheelchair  (measurements PT);Hoyer lift;Hospital bed;BSC/3in1    Recommendations for Other Services Rehab consult     Precautions / Restrictions Precautions Precautions: Fall Recall of Precautions/Restrictions: Intact Precaution/Restrictions Comments: Abdominal sx - wound vac, JP drain, ostomy Restrictions Weight Bearing Restrictions Per Provider Order: No Other Position/Activity Restrictions: wound vac, JP drain, ostomy     Mobility  Bed Mobility Overal bed mobility: Needs Assistance Bed Mobility: Supine to Sit   Sidelying to sit: Max assist, +2 for safety/equipment, HOB elevated, Used rails       General bed mobility comments: pt guided through log roll technque for abdominal precuations with min A to roll to the R and extensive assist and cues for to complete supine to sit EOB, pt able to scoot anteriorly with min A x 2 and cues, sitting balance EOB with B LE on floor with mod A and progressing to CGA with posterior lean due to abdominal pain    Transfers Overall transfer level: Needs assistance Equipment used: Rolling walker (2 wheels) Transfers: Sit to/from Stand, Bed to chair/wheelchair/BSC Sit to Stand: +2 safety/equipment, +2 physical assistance, Min assist, From elevated surface Stand pivot transfers: Min assist, +2 physical assistance, +2 safety/equipment, From elevated surface         General transfer comment: cues for trunk flexion and power up, min A x 2 for safety, stability cues and line/lead management, pt required increased time with shuffling like pattern for LE advacement required for pivoting to recliner, pt able to reach back with R UE for improved eccentric control to sitting surface and able to rock side  to side to weight shift and scoot bottom posteriorly in recliner.    Ambulation/Gait               General Gait Details: NT due to fatigue and pain   Stairs             Wheelchair Mobility     Tilt Bed    Modified Rankin (Stroke Patients  Only)       Balance Overall balance assessment: Needs assistance Sitting-balance support: Bilateral upper extremity supported, Feet supported Sitting balance-Leahy Scale: Fair Sitting balance - Comments: mod A and able to progress to CGA seated EOB Postural control: Posterior lean Standing balance support: Bilateral upper extremity supported, During functional activity, Reliant on assistive device for balance Standing balance-Leahy Scale: Poor Standing balance comment: B UE support at RW and min A x 2 for safety and balance                            Communication Communication Communication: No apparent difficulties  Cognition Arousal: Alert Behavior During Therapy: WFL for tasks assessed/performed   PT - Cognitive impairments: No apparent impairments                         Following commands: Intact Following commands impaired: Follows one step commands with increased time, Only follows one step commands consistently    Cueing    Exercises      General Comments        Pertinent Vitals/Pain Pain Assessment Pain Assessment: No/denies pain Pain Score: 7  Pain Location: abdomen and L dorsal and lateral asspect of foot Pain Descriptors / Indicators: Guarding, Grimacing, Moaning Pain Intervention(s): Limited activity within patient's tolerance, Premedicated before session, Monitored during session    Home Living                          Prior Function            PT Goals (current goals can now be found in the care plan section) Acute Rehab PT Goals Patient Stated Goal: not stated PT Goal Formulation: With patient Time For Goal Achievement: 03/21/24 Potential to Achieve Goals: Good Progress towards PT goals: Progressing toward goals    Frequency    Min 2X/week      PT Plan      Co-evaluation PT/OT/SLP Co-Evaluation/Treatment: Yes Reason for Co-Treatment: Complexity of the patient's impairments (multi-system  involvement);Necessary to address cognition/behavior during functional activity;For patient/therapist safety PT goals addressed during session: Mobility/safety with mobility;Balance;Proper use of DME OT goals addressed during session: ADL's and self-care;Proper use of Adaptive equipment and DME      AM-PAC PT 6 Clicks Mobility   Outcome Measure  Help needed turning from your back to your side while in a flat bed without using bedrails?: A Lot Help needed moving from lying on your back to sitting on the side of a flat bed without using bedrails?: A Lot Help needed moving to and from a bed to a chair (including a wheelchair)?: A Lot Help needed standing up from a chair using your arms (e.g., wheelchair or bedside chair)?: A Lot Help needed to walk in hospital room?: A Lot Help needed climbing 3-5 steps with a railing? : Total 6 Click Score: 11    End of Session Equipment Utilized During Treatment: Gait belt Activity Tolerance: Patient limited by pain;Patient limited by fatigue Patient  left: in chair;with call bell/phone within reach;with chair alarm set Nurse Communication: Mobility status PT Visit Diagnosis: Difficulty in walking, not elsewhere classified (R26.2);Muscle weakness (generalized) (M62.81);Pain     Time: 8855-8789 PT Time Calculation (min) (ACUTE ONLY): 26 min  Charges:    $Therapeutic Activity: 8-22 mins PT General Charges $$ ACUTE PT VISIT: 1 Visit                     [place], PT Acute Rehab    [place] VEAR Drone 03/17/2024, 2:54 PM

## 2024-03-18 DIAGNOSIS — N950 Postmenopausal bleeding: Secondary | ICD-10-CM | POA: Diagnosis not present

## 2024-03-18 DIAGNOSIS — K631 Perforation of intestine (nontraumatic): Secondary | ICD-10-CM | POA: Diagnosis not present

## 2024-03-18 DIAGNOSIS — M5412 Radiculopathy, cervical region: Secondary | ICD-10-CM | POA: Diagnosis not present

## 2024-03-18 DIAGNOSIS — R9389 Abnormal findings on diagnostic imaging of other specified body structures: Secondary | ICD-10-CM | POA: Diagnosis not present

## 2024-03-18 LAB — BASIC METABOLIC PANEL WITH GFR
Anion gap: 8 (ref 5–15)
BUN: 23 mg/dL (ref 8–23)
CO2: 25 mmol/L (ref 22–32)
Calcium: 8.5 mg/dL — ABNORMAL LOW (ref 8.9–10.3)
Chloride: 102 mmol/L (ref 98–111)
Creatinine, Ser: 0.86 mg/dL (ref 0.44–1.00)
GFR, Estimated: >60 mL/min (ref >60)
Glucose, Bld: 159 mg/dL — ABNORMAL HIGH (ref 70–99)
Potassium: 4.1 mmol/L (ref 3.5–5.1)
Sodium: 135 mmol/L (ref 135–145)

## 2024-03-18 LAB — GLUCOSE, CAPILLARY
Glucose-Capillary: 150 mg/dL — ABNORMAL HIGH (ref 70–99)
Glucose-Capillary: 156 mg/dL — ABNORMAL HIGH (ref 70–99)
Glucose-Capillary: 159 mg/dL — ABNORMAL HIGH (ref 70–99)
Glucose-Capillary: 176 mg/dL — ABNORMAL HIGH (ref 70–99)

## 2024-03-18 LAB — MAGNESIUM: Magnesium: 2 mg/dL (ref 1.7–2.4)

## 2024-03-18 LAB — PHOSPHORUS: Phosphorus: 2.8 mg/dL (ref 2.5–4.6)

## 2024-03-18 MED ORDER — TRAVASOL 10 % IV SOLN
INTRAVENOUS | Status: AC
  Filled 2024-03-18: qty 696

## 2024-03-18 MED ORDER — INSULIN ASPART 100 UNIT/ML IJ SOLN
0.0000 [IU] | Freq: Four times a day (QID) | INTRAMUSCULAR | Status: DC
  Administered 2024-03-18 (×3): 4 [IU] via SUBCUTANEOUS
  Administered 2024-03-19: 3 [IU] via SUBCUTANEOUS
  Administered 2024-03-19 – 2024-03-20 (×2): 4 [IU] via SUBCUTANEOUS
  Administered 2024-03-20 – 2024-03-24 (×10): 3 [IU] via SUBCUTANEOUS
  Administered 2024-03-24 – 2024-03-25 (×2): 4 [IU] via SUBCUTANEOUS
  Administered 2024-03-26 (×2): 3 [IU] via SUBCUTANEOUS

## 2024-03-18 MED ORDER — INSULIN GLARGINE-YFGN 100 UNIT/ML ~~LOC~~ SOLN
5.0000 [IU] | Freq: Every day | SUBCUTANEOUS | Status: DC
  Filled 2024-03-18: qty 0.05

## 2024-03-18 NOTE — Progress Notes (Signed)
 [name]  PROGRESS NOTE  Alexandria Durham FMW:[id number] DOB: 01-22-1946 DOA: 02/29/2024 PCP: [name], NP   Brief HPI:   78 y.o.  F with PMH significant for thickened endometrium, HTN, Type 2 DM, CKD stage IIIa, recent pancreatitis and acute cholecystitis s/p lap chole and D&C in May of this year who was admitted 7/1 for hysteroscopy and repeat D&C.  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 Pt was acidotic post-procedure, so decision made to leave intubated, PCCM consulted in this setting.  Significant events as below.  Patient was eventually transferred under [facility] to comanage on 03/07/2024 while GYN oncology remains the primary service. Significant events Patient was admitted to the ICU postop intubated on the 7/2 extubated 7/ 4 patient transferred under [facility] for continuation of co management 7/8    Assessment/Plan:   S/p D&C procedure complicated by bowel perforation S/p partial colectomy and ileostomy  Septic shock, shock resolved  Presented for d&c on 7/1 c/b bowel perforation. OR 7/2 after development of septic shock, zosyn  started. Had ex lap with sigmoidoscopy, partial colectomy  and ileostomy placement. CT AP 7/6 showing sigmoid diverticulitis, air-fluid level 4.6cm unable to discern abscess vs segment of colon.  Leukocytosis resolved Culture with E coli and Enterococcus sensitivity pending antibiotics changed to meropenem   Continue Zyvox  patient started on TPN.  Management per primary service.   Hyperkalemia  resolved after Lokelma   Hyponatremia - Improved to 132.   Chest pain  7/6 complained of chest pressure. Trop x2 flat @ 22. EKG non ischemic. CXR with no acute findings.  Monitor on telemetry.   AKI on CKD stage IIIa: Resolved creatinine 0.84   Acute Thrombocytopenia resolved   Type 2 DM  - On TPN -Continue  resistant sliding scale with NovoLog  -Will add Lantus  5 units subcu daily -Continue Tradjenta    Hypothyroidism  - con't synthroid  75mcg daily     Hyperlipidemia  HTN - con't zetia  10mg  daily  - hydralazine  prn for SBP > 180      Respiratory insufficiency:  Resolved Required oxygen, chest x-ray showed no atelectasis   Echocardiogram  normal ejection fraction as below.   She received a dose of Lasix  40 mg x 1 yesterday.  Chest xray- 7/10  Interval placement of right arm PICC as described. No pneumothorax.Persistent low lung volumes with mild bibasilar atelectasis.   Echo 7/10- Left ventricular ejection fraction, by estimation, is 65 to 70%. The  left ventricle has normal function. The left ventricle has no regional  wall motion abnormalities. There is mild concentric left ventricular  hypertrophy. Left ventricular diastolic  parameters are consistent with Grade I diastolic dysfunction (impaired  relaxation).     Nutrition Problem: Severe Malnutrition Etiology: acute illness  Signs/Symptoms: energy intake < or equal to 50% for > or equal to 5 days, mild muscle depletion, percent weight loss (10% in 1.5 months) Percent weight loss: 10 % (in 1.5 months)    Medications     buPROPion   150 mg Oral QPM   Chlorhexidine  Gluconate Cloth  6 each Topical Daily   enoxaparin  (LOVENOX ) injection  40 mg Subcutaneous Q24H   ezetimibe   10 mg Oral Daily   feeding supplement (KATE FARMS STANDARD 1.4)  325 mL Oral TID BM   gabapentin   300 mg Oral TID   insulin  aspart  0-15 Units Subcutaneous Q4H  levothyroxine   75 mcg Oral QAC breakfast   lidocaine   1 patch Transdermal Q24H   linagliptin   5 mg Oral Daily   linezolid   600 mg Oral Q12H   melatonin  3 mg Oral QHS   multivitamin with minerals  1 tablet Oral Daily   pantoprazole   40 mg Oral QHS   polyethylene glycol  17 g Oral Daily   sodium chloride  flush  10-40 mL Intracatheter Q12H   sodium chloride  flush  10-40 mL Intracatheter Q12H      Data Reviewed:   CBG:  Recent Labs  Lab 03/17/24 0458 03/17/24 0730 03/17/24 1146 03/17/24 1636 03/18/24 0725  GLUCAP 201* 160* 156* 238* 176*    SpO2: 98 % O2 Flow Rate (L/min): 2 L/min FiO2 (%): 30 %    Vitals:   03/17/24 1330 03/17/24 2010 03/18/24 0446 03/18/24 0500  BP: (!) 141/76 (!) 150/75 (!) 140/70   Pulse: 95 99 90   Resp: 20 16 18    Temp: 98.4 F (36.9 C) 98.6 F (37 C) 97.6 F (36.4 C)   TempSrc: Oral Oral Oral   SpO2: 98% 95% 98%   Weight:    76.3 kg  Height:          Data Reviewed:  Basic Metabolic Panel: Recent Labs  Lab 03/12/24 0546 03/13/24 0518 03/14/24 0539 03/15/24 0427 03/16/24 0438 03/17/24 0711 03/18/24 0612  NA 131* 130* 127* 128* 132* 132* 135  K 5.2* 4.7 4.0 4.0 3.9 4.2 4.1  CL 100 97* 98 97* 100 100 102  CO2 22 22 23 23 23 24 25   GLUCOSE 177* 218* 204* 202* 178* 186* 159*  BUN 26* 27* 27* 27* 25* 24* 23  CREATININE 0.96 0.80 0.82 0.78 0.89 0.83 0.86  CALCIUM  8.9 8.8* 8.1* 8.3* 8.6* 8.5* 8.5*  MG 1.7 1.8  --  1.8 2.0  --  2.0  PHOS 3.5 3.8  --  3.1 3.3  --  2.8    CBC: Recent Labs  Lab 03/13/24 1040 03/14/24 0539 03/15/24 0427 03/16/24 0438 03/17/24 0711  WBC 8.5 8.5 8.7 8.6 9.0  HGB 9.0* 8.2* 7.9* 7.8* 8.2*  HCT 27.0* 24.6* 23.4* 23.5* 25.1*  MCV 89.4 89.8 89.3 90.4 91.3  PLT 331 350 362 384 495*    LFT Recent Labs  Lab 03/13/24 0518 03/16/24 0438 03/17/24 0711  AST 90* 48* 59*  ALT 118* 86* 85*  ALKPHOS 144* 126 129*  BILITOT 0.5 0.7 0.5  PROT 5.6* 6.3* 6.4*  ALBUMIN  1.8* 1.8* 1.8*     Antibiotics: Anti-infectives (From admission, onward)    Start     Dose/Rate Route Frequency Ordered Stop   03/16/24 1230  meropenem  (MERREM ) 1 g in sodium chloride  0.9 % 100 mL IVPB        1 g 200 mL/hr over 30 Minutes Intravenous Every 8 hours 03/16/24 1134     03/14/24 1000  linezolid  (ZYVOX ) tablet 600 mg        600 mg Oral Every 12 hours 03/14/24 0903     03/14/24 0930  piperacillin -tazobactam  (ZOSYN ) IVPB 3.375 g  Status:  Discontinued        3.375 g 12.5 mL/hr over 240 Minutes Intravenous Every 8 hours 03/14/24 0833 03/16/24 1134   03/13/24 1300  linezolid  (ZYVOX ) IVPB 600 mg  Status:  Discontinued        600 mg 300 mL/hr over 60 Minutes Intravenous Every 12 hours 03/13/24 1200 03/14/24 0903   03/13/24 1300  ceFEPIme  (  MAXIPIME ) 2 g in sodium chloride  0.9 % 100 mL IVPB  Status:  Discontinued        2 g 200 mL/hr over 30 Minutes Intravenous Every 8 hours 03/13/24 1200 03/14/24 0833   03/10/24 1200  amoxicillin -clavulanate (AUGMENTIN ) 875-125 MG per tablet 1 tablet  Status:  Discontinued        1 tablet Oral Every 12 hours 03/10/24 1114 03/13/24 1200   03/01/24 1200  piperacillin -tazobactam (ZOSYN ) IVPB 3.375 g  Status:  Discontinued        3.375 g 12.5 mL/hr over 240 Minutes Intravenous Every 8 hours 03/01/24 0946 03/01/24 1116   03/01/24 1130  piperacillin -tazobactam (ZOSYN ) IVPB 3.375 g  Status:  Discontinued        3.375 g 12.5 mL/hr over 240 Minutes Intravenous Every 8 hours 03/01/24 1116 03/10/24 1114          Subjective   No new complaints.  Objective    Physical Examination:  Appears in no acute distress Lungs clear to auscultation bilaterally Abdomen is soft nontender, ostomy in place   Status is: Inpatient:             Sabas GORMAN Brod   [facility] If 7PM-7AM, please contact night-coverage at [email or web address], Office  [phone]   03/18/2024, 8:43 AM  LOS: 18 days

## 2024-03-18 NOTE — Progress Notes (Signed)
 PHARMACY - TOTAL PARENTERAL NUTRITION CONSULT NOTE   Indication: Prolonged ileus  Patient Measurements: Height: 5' 4 (162.6 cm) Weight: 76.3 kg (168 lb 3.4 oz) IBW/kg (Calculated) : 54.7 TPN AdjBW (KG): 60.8 Body mass index is 28.87 kg/m.  Assessment: 78 year old female s/p hysterectomy with D&C on 7/1. 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 Taken back to the OR 7/2 for ex-lap and partial colectomy and ileostomy.  Pharmacy consulted for TPN 7/7.  Glucose / Insulin : hx DM -Goal BG <180. Range: 156-238 (18 units SSI/24 hrs) -linagliptan, mSSI q4h, TPN contains 20 units of insulin  (increased yesterday, BG readings <200 since started)  Electrolytes: CorrCa (10.3) slightly elevated, no Ca in TPN -All other electrolytes WNL Renal: Scr <1 and stable, BUN WNL Hepatic:  AST/ALT  at 59/85; Alk phos slightly up to 129; Tbili WNL. TG (282) elevated on 7/14 Intake / Output; MIVF: PO intake not charted 7/18 -UOP: 550 mL + unmeasured -Drain: None charted -Stool: 350 mL + 1 unmeasured GI Imaging: -7/6 CT A/P 4.6 cm diameter air-fluid level in the anterior pelvis in or adjacent to the sigmoid colon; this may represent an abscess or an air-fluid level within a dilated segment of colon  GI Surgeries / Procedures:  -7/2 ex lap, colectomy w/ colostomy -7/1 hysterectomy  Central access: PICC TPN start date: 7/7  Nutritional Goals: Goal TPN rate is 75 mL/hr (provides 104 g of protein and 1778 kcals per day)  RD Assessment: Estimated Needs Total Energy Estimated Needs: 1750-1900 kcals Total Protein Estimated Needs: 90-110 grams Total Fluid Estimated Needs: >/= 1.8L  Current Nutrition:  Diet: Carb Modified, Fluid consistency: Thin  - Kate [place] 325 mL TID ( charted given)  TPN reduced to 50 mL/hr per d/w MD on 03/14/24  Plan:  Now:  Change mSSI q4h to rSSI q6h  At 1800:  Continue TPN at 50 mL/hr Will provide 69 g protein, 1185  kcal Electrolytes in TPN: No changes Na 150 (max) mEq/L, K 20 mEq/L, Ca 0, Mag 7 mEq/L, Phos 20 mmol/L Cl:Ac 2:1 MVI PO Continue rSSI q6h and adjust as needed Continue with 20 units of insulin  in TPN Monitor TPN labs on Mon/Thurs, and as needed  Ronal CHRISTELLA Rav, PharmD 03/18/24 10:35 AM

## 2024-03-18 NOTE — Plan of Care (Signed)
  Problem: Clinical Measurements: Goal: Ability to maintain clinical measurements within normal limits will improve Outcome: Progressing Goal: Will remain free from infection Outcome: Progressing Goal: Diagnostic test results will improve Outcome: Progressing Goal: Respiratory complications will improve Outcome: Progressing Goal: Cardiovascular complication will be avoided Outcome: Progressing   Problem: Activity: Goal: Risk for activity intolerance will decrease Outcome: Progressing   Problem: Elimination: Goal: Will not experience complications related to bowel motility Outcome: Progressing Goal: Will not experience complications related to urinary retention Outcome: Progressing   Problem: Pain Managment: Goal: General experience of comfort will improve and/or be controlled Outcome: Progressing   Problem: Safety: Goal: Ability to remain free from injury will improve Outcome: Progressing   Problem: Skin Integrity: Goal: Risk for impaired skin integrity will decrease Outcome: Progressing   Problem: Education: Goal: Ability to describe self-care measures that may prevent or decrease complications (Diabetes Survival Skills Education) will improve Outcome: Progressing Goal: Individualized Educational Video(s) Outcome: Progressing   Problem: Coping: Goal: Ability to adjust to condition or change in health will improve Outcome: Progressing   Problem: Fluid Volume: Goal: Ability to maintain a balanced intake and output will improve Outcome: Progressing   Problem: Health Behavior/Discharge Planning: Goal: Ability to identify and utilize available resources and services will improve Outcome: Progressing Goal: Ability to manage health-related needs will improve Outcome: Progressing   Problem: Metabolic: Goal: Ability to maintain appropriate glucose levels will improve Outcome: Progressing   Problem: Nutritional: Goal: Maintenance of adequate nutrition will  improve Outcome: Progressing Goal: Progress toward achieving an optimal weight will improve Outcome: Progressing   Problem: Skin Integrity: Goal: Risk for impaired skin integrity will decrease Outcome: Progressing   Problem: Tissue Perfusion: Goal: Adequacy of tissue perfusion will improve Outcome: Progressing   Problem: Respiratory: Goal: Ability to maintain a clear airway and adequate ventilation will improve Outcome: Progressing   Problem: Activity: Goal: Ability to tolerate increased activity will improve Outcome: Progressing   Problem: Role Relationship: Goal: Method of communication will improve Outcome: Progressing

## 2024-03-18 NOTE — Progress Notes (Signed)
 17 Days Post-Op Procedure(s) (LRB): LAPAROTOMY, EXPLORATORY (N/A) SIGMOIDOSCOPY COLECTOMY, WITH COLOSTOMY CREATION MOBILIZATION, SPLENIC FLEXURE, WITH PARTIAL COLECTOMY CYSTOSCOPY  Subjective: No new complaints  Objective: Vital signs in last 24 hours: Temp:  [97.6 F (36.4 C)-98.6 F (37 C)] 97.6 F (36.4 C) (07/19 0446) Pulse Rate:  [90-99] 90 (07/19 0446) Resp:  [16-20] 18 (07/19 0446) BP: (140-150)/(70-76) 140/70 (07/19 0446) SpO2:  [95 %-98 %] 98 % (07/19 0446) Weight:  [76.3 kg] 76.3 kg (07/19 0500) Last BM Date : 03/17/24  Intake/Output from previous day: 07/18 0701 - 07/19 0700 In: 1455.1 [I.V.:1155.1; IV Piggyback:300] Out: 900 [Urine:550; Stool:350]  Physical Examination: General: Sleepy, oriented when awake, no acute distress, in bed Respiratory: Normal work of breathing. Lungs clear to auscultation bilaterally without rhonchi. Cardiovascular: Regular sinus rhythm and rate, no murmurs or rubs. Abdomen: soft, + BS, tender to palpation diffusely (stable). Incision: wound VAC in place to suction. JP drain with clear serous fluid in tubing, bulb empty. Dry dressing around drain. Ostomy with soft stool    Extremities: extremities warm and well perfused with trace edema, SCDs in place Gu: Purewick in place with clear yellow urine  Labs:    Latest Ref Rng & Units 03/17/2024    7:11 AM 03/16/2024    4:38 AM 03/15/2024    4:27 AM  CBC  WBC 4.0 - 10.5 K/uL 9.0  8.6  8.7   Hemoglobin 12.0 - 15.0 g/dL 8.2  7.8  7.9   Hematocrit 36.0 - 46.0 % 25.1  23.5  23.4   Platelets 150 - 400 K/uL 495  384  362       Latest Ref Rng & Units 03/18/2024    6:12 AM 03/17/2024    7:11 AM 03/16/2024    4:38 AM  BMP  Glucose 70 - 99 mg/dL 840  813  821   BUN 8 - 23 mg/dL 23  24  25    Creatinine 0.44 - 1.00 mg/dL 9.13  9.16  9.10   Sodium 135 - 145 mmol/L 135  132  132   Potassium 3.5 - 5.1 mmol/L 4.1  4.2  3.9   Chloride 98 - 111 mmol/L 102  100  100   CO2 22 - 32 mmol/L 25  24  23     Calcium  8.9 - 10.3 mg/dL 8.5  8.5  8.6    Assessment: 78 y.o. s/p Procedure(s): LAPAROTOMY, EXPLORATORY SIGMOIDOSCOPY COLECTOMY, WITH COLOSTOMY CREATION MOBILIZATION, SPLENIC FLEXURE, WITH PARTIAL COLECTOMY CYSTOSCOPY.   ID: On broad spectrum abx for infectious process. Afebrile   FEN: Malnutrition: on TPN.  Some oral intake    Heme: Acute on chronic anemia: asymptomatic. No labs today   Endo: Type 2 diabetes mellitus: CBGs borderline CBG (last 3)  Recent Labs    03/17/24 1146 03/17/24 1636 03/18/24 0725  GLUCAP 156* 238* 176*      Prophylaxis: SCDs, on lovenox     Plan: - Continue nutritional supplements. Continue calorie count. - Hospitalist input appreciated - Continue broad spectrum abx - OOB w/assistance   LOS: 18 days    Olam Mill, MD 03/18/2024, 9:36 AM

## 2024-03-19 DIAGNOSIS — K631 Perforation of intestine (nontraumatic): Secondary | ICD-10-CM | POA: Diagnosis not present

## 2024-03-19 DIAGNOSIS — R9389 Abnormal findings on diagnostic imaging of other specified body structures: Secondary | ICD-10-CM | POA: Diagnosis not present

## 2024-03-19 DIAGNOSIS — N950 Postmenopausal bleeding: Secondary | ICD-10-CM | POA: Diagnosis not present

## 2024-03-19 DIAGNOSIS — M5412 Radiculopathy, cervical region: Secondary | ICD-10-CM | POA: Diagnosis not present

## 2024-03-19 LAB — GLUCOSE, CAPILLARY
Glucose-Capillary: 111 mg/dL — ABNORMAL HIGH (ref 70–99)
Glucose-Capillary: 120 mg/dL — ABNORMAL HIGH (ref 70–99)
Glucose-Capillary: 147 mg/dL — ABNORMAL HIGH (ref 70–99)
Glucose-Capillary: 150 mg/dL — ABNORMAL HIGH (ref 70–99)
Glucose-Capillary: 152 mg/dL — ABNORMAL HIGH (ref 70–99)
Glucose-Capillary: 159 mg/dL — ABNORMAL HIGH (ref 70–99)

## 2024-03-19 MED ORDER — COLCHICINE 0.6 MG PO TABS
0.6000 mg | ORAL_TABLET | Freq: Two times a day (BID) | ORAL | Status: AC
  Administered 2024-03-19 – 2024-03-21 (×6): 0.6 mg via ORAL
  Filled 2024-03-19 (×6): qty 1

## 2024-03-19 MED ORDER — TRAVASOL 10 % IV SOLN
INTRAVENOUS | Status: AC
  Filled 2024-03-19: qty 696

## 2024-03-19 NOTE — Progress Notes (Signed)
 PHARMACY - TOTAL PARENTERAL NUTRITION CONSULT NOTE   Indication: Prolonged ileus  Patient Measurements: Height: 5' 4 (162.6 cm) Weight: 70.7 kg (155 lb 13.8 oz) IBW/kg (Calculated) : 54.7 TPN AdjBW (KG): 60.8 Body mass index is 26.75 kg/m.  Assessment: 78 year old female s/p hysterectomy with D&C on 7/1. 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 Taken back to the OR 7/2 for ex-lap and partial colectomy and ileostomy.  Pharmacy consulted for TPN 7/7.  Glucose / Insulin : hx DM -Goal BG <180. Range: 150-159 (18 units SSI/24 hrs) -linagliptan, rSSI q6h, TPN contains 20 units of insulin   Electrolytes: Last labs on 7/19 -CorrCa (10.3) slightly elevated, no Ca in TPN -All other electrolytes WNL Renal: Scr <1 and stable, BUN WNL Hepatic:  AST/ALT  at 59/85; Alk phos slightly up to 129; Tbili WNL. TG (282) elevated on 7/14 Intake / Output; MIVF: PO intake 120 mL -UOP: 3200 mL -Drain: 10 mL -Stool: 200 mL GI Imaging: -7/6 CT A/P 4.6 cm diameter air-fluid level in the anterior pelvis in or adjacent to the sigmoid colon; this may represent an abscess or an air-fluid level within a dilated segment of colon  GI Surgeries / Procedures:  -7/2 ex lap, colectomy w/ colostomy -7/1 hysterectomy  Central access: PICC TPN start date: 7/7  Nutritional Goals: Goal TPN rate is 75 mL/hr (provides 104 g of protein and 1778 kcals per day)  RD Assessment: Estimated Needs Total Energy Estimated Needs: 1750-1900 kcals Total Protein Estimated Needs: 90-110 grams Total Fluid Estimated Needs: >/= 1.8L  Current Nutrition:  Diet: Carb Modified, Fluid consistency: Thin  - [place] 325 mL TID (most charted given) - Calorie count ongoing  TPN reduced to 50 mL/hr per d/w MD on 03/14/24  Plan:   At 1800:  Continue TPN at 50 mL/hr Will provide 69 g protein, 1185 kcal Electrolytes in TPN: No changes Na 150 (max) mEq/L, K 20 mEq/L, Ca 0, Mag 7 mEq/L, Phos  20 mmol/L Cl:Ac 2:1 MVI PO Continue rSSI q6h and adjust as needed Continue with 20 units of insulin  in TPN Monitor TPN labs on Mon/Thurs, and as needed Follow results of calorie count, ability to wean off of TPN  Ronal CHRISTELLA Rav, PharmD 03/19/24 9:58 AM

## 2024-03-19 NOTE — Plan of Care (Signed)
 Problem: Clinical Measurements: Goal: Ability to maintain clinical measurements within normal limits will improve 03/19/2024 0600 by Nicholaus Raiford RAMAN, RN Outcome: Progressing 03/19/2024 0559 by Nicholaus Raiford RAMAN, RN Outcome: Progressing Goal: Will remain free from infection 03/19/2024 0600 by Nicholaus Raiford RAMAN, RN Outcome: Progressing 03/19/2024 0559 by Nicholaus Raiford RAMAN, RN Outcome: Progressing Goal: Diagnostic test results will improve 03/19/2024 0600 by Nicholaus Raiford RAMAN, RN Outcome: Progressing 03/19/2024 0559 by Nicholaus Raiford RAMAN, RN Outcome: Progressing Goal: Respiratory complications will improve 03/19/2024 0600 by Nicholaus Raiford RAMAN, RN Outcome: Progressing 03/19/2024 0559 by Nicholaus Raiford RAMAN, RN Outcome: Progressing Goal: Cardiovascular complication will be avoided 03/19/2024 0600 by Nicholaus Raiford RAMAN, RN Outcome: Progressing 03/19/2024 0559 by Nicholaus Raiford RAMAN, RN Outcome: Progressing   Problem: Activity: Goal: Risk for activity intolerance will decrease 03/19/2024 0600 by Nicholaus Raiford RAMAN, RN Outcome: Progressing 03/19/2024 0559 by Nicholaus Raiford RAMAN, RN Outcome: Progressing   Problem: Coping: Goal: Level of anxiety will decrease 03/19/2024 0600 by Nicholaus Raiford RAMAN, RN Outcome: Progressing 03/19/2024 0559 by Nicholaus Raiford RAMAN, RN Outcome: Progressing   Problem: Elimination: Goal: Will not experience complications related to bowel motility 03/19/2024 0600 by Nicholaus Raiford RAMAN, RN Outcome: Progressing 03/19/2024 0559 by Nicholaus Raiford RAMAN, RN Outcome: Progressing Goal: Will not experience complications related to urinary retention 03/19/2024 0600 by Nicholaus Raiford RAMAN, RN Outcome: Progressing 03/19/2024 0559 by Nicholaus Raiford RAMAN, RN Outcome: Progressing   Problem: Pain Managment: Goal: General experience of comfort will improve and/or be controlled 03/19/2024 0600 by Nicholaus Raiford RAMAN, RN Outcome: Progressing 03/19/2024 0559 by Nicholaus Raiford RAMAN, RN Outcome: Progressing   Problem:  Safety: Goal: Ability to remain free from injury will improve 03/19/2024 0600 by Nicholaus Raiford RAMAN, RN Outcome: Progressing 03/19/2024 0559 by Nicholaus Raiford RAMAN, RN Outcome: Progressing   Problem: Skin Integrity: Goal: Risk for impaired skin integrity will decrease 03/19/2024 0600 by Nicholaus Raiford RAMAN, RN Outcome: Progressing 03/19/2024 0559 by Nicholaus Raiford RAMAN, RN Outcome: Progressing   Problem: Education: Goal: Ability to describe self-care measures that may prevent or decrease complications (Diabetes Survival Skills Education) will improve 03/19/2024 0600 by Nicholaus Raiford RAMAN, RN Outcome: Progressing 03/19/2024 0559 by Nicholaus Raiford RAMAN, RN Outcome: Progressing Goal: Individualized Educational Video(s) 03/19/2024 0600 by Nicholaus Raiford RAMAN, RN Outcome: Progressing 03/19/2024 0559 by Nicholaus Raiford RAMAN, RN Outcome: Progressing   Problem: Coping: Goal: Ability to adjust to condition or change in health will improve 03/19/2024 0600 by Nicholaus Raiford RAMAN, RN Outcome: Progressing 03/19/2024 0559 by Nicholaus Raiford RAMAN, RN Outcome: Progressing   Problem: Fluid Volume: Goal: Ability to maintain a balanced intake and output will improve 03/19/2024 0600 by Nicholaus Raiford RAMAN, RN Outcome: Progressing 03/19/2024 0559 by Nicholaus Raiford RAMAN, RN Outcome: Progressing   Problem: Health Behavior/Discharge Planning: Goal: Ability to identify and utilize available resources and services will improve 03/19/2024 0600 by Nicholaus Raiford RAMAN, RN Outcome: Progressing 03/19/2024 0559 by Nicholaus Raiford RAMAN, RN Outcome: Progressing Goal: Ability to manage health-related needs will improve 03/19/2024 0600 by Nicholaus Raiford RAMAN, RN Outcome: Progressing 03/19/2024 0559 by Nicholaus Raiford RAMAN, RN Outcome: Progressing   Problem: Metabolic: Goal: Ability to maintain appropriate glucose levels will improve 03/19/2024 0600 by Nicholaus Raiford RAMAN, RN Outcome: Progressing 03/19/2024 0559 by Nicholaus Raiford RAMAN, RN Outcome: Progressing    Problem: Nutritional: Goal: Maintenance of adequate nutrition will improve 03/19/2024 0600 by Nicholaus Raiford RAMAN, RN Outcome: Progressing 03/19/2024 0559 by Nicholaus Raiford RAMAN, RN Outcome: Progressing Goal: Progress toward achieving an optimal weight will improve  03/19/2024 0600 by Nicholaus Raiford RAMAN, RN Outcome: Progressing 03/19/2024 0559 by Nicholaus Raiford RAMAN, RN Outcome: Progressing   Problem: Skin Integrity: Goal: Risk for impaired skin integrity will decrease 03/19/2024 0600 by Nicholaus Raiford RAMAN, RN Outcome: Progressing 03/19/2024 0559 by Nicholaus Raiford RAMAN, RN Outcome: Progressing   Problem: Tissue Perfusion: Goal: Adequacy of tissue perfusion will improve 03/19/2024 0600 by Nicholaus Raiford RAMAN, RN Outcome: Progressing 03/19/2024 0559 by Nicholaus Raiford RAMAN, RN Outcome: Progressing   Problem: Education: Goal: Knowledge of the prescribed therapeutic regimen will improve 03/19/2024 0600 by Nicholaus Raiford RAMAN, RN Outcome: Progressing 03/19/2024 0559 by Nicholaus Raiford RAMAN, RN Outcome: Progressing Goal: Understanding of sexual limitations or changes related to disease process or condition will improve 03/19/2024 0600 by Nicholaus Raiford RAMAN, RN Outcome: Progressing 03/19/2024 0559 by Nicholaus Raiford RAMAN, RN Outcome: Progressing Goal: Individualized Educational Video(s) 03/19/2024 0600 by Nicholaus Raiford RAMAN, RN Outcome: Progressing 03/19/2024 0559 by Nicholaus Raiford RAMAN, RN Outcome: Progressing   Problem: Self-Concept: Goal: Communication of feelings regarding changes in body function or appearance will improve 03/19/2024 0600 by Nicholaus Raiford RAMAN, RN Outcome: Progressing 03/19/2024 0559 by Nicholaus Raiford RAMAN, RN Outcome: Progressing   Problem: Skin Integrity: Goal: Demonstration of wound healing without infection will improve 03/19/2024 0600 by Nicholaus Raiford RAMAN, RN Outcome: Progressing 03/19/2024 0559 by Nicholaus Raiford RAMAN, RN Outcome: Progressing   Problem: Activity: Goal: Ability to tolerate increased  activity will improve 03/19/2024 0600 by Nicholaus Raiford RAMAN, RN Outcome: Progressing 03/19/2024 0559 by Nicholaus Raiford RAMAN, RN Outcome: Progressing   Problem: Respiratory: Goal: Ability to maintain a clear airway and adequate ventilation will improve 03/19/2024 0600 by Nicholaus Raiford RAMAN, RN Outcome: Progressing 03/19/2024 0559 by Nicholaus Raiford RAMAN, RN Outcome: Progressing   Problem: Role Relationship: Goal: Method of communication will improve 03/19/2024 0600 by Nicholaus Raiford RAMAN, RN Outcome: Progressing 03/19/2024 0559 by Nicholaus Raiford RAMAN, RN Outcome: Progressing

## 2024-03-19 NOTE — Progress Notes (Addendum)
 18 Days Post-Op Procedure(s) (LRB): LAPAROTOMY, EXPLORATORY (N/A) SIGMOIDOSCOPY COLECTOMY, WITH COLOSTOMY CREATION MOBILIZATION, SPLENIC FLEXURE, WITH PARTIAL COLECTOMY CYSTOSCOPY  Subjective: C/O left foot pain--diagnosed w/Gout prior to admission; she wasn't started on medication. Per pt had several spoonfuls of oatmeal/bite of banana.   Objective: Vital signs in last 24 hours: Temp:  [98.2 F (36.8 C)-98.4 F (36.9 C)] 98.3 F (36.8 C) (07/20 0600) Pulse Rate:  [93-95] 94 (07/20 0600) Resp:  [16-18] 18 (07/20 0600) BP: (130-147)/(71-78) 147/71 (07/20 0600) SpO2:  [94 %-99 %] 99 % (07/20 0600) Weight:  [70.7 kg] 70.7 kg (07/20 0500) Last BM Date : 03/19/24  Calorie count 7/19: 1375+ Intake/Output from previous day: 07/19 0701 - 07/20 0700 In: 1877.4 [P.O.:120; I.V.:1457.4; IV Piggyback:300] Out: 3465 [Urine:3200; Drains:10; Stool:255]  Physical Examination: General: Sleepy, oriented when awake, no acute distress, in bed Respiratory: Normal work of breathing. Lungs clear to auscultation bilaterally without rhonchi. Cardiovascular: Regular sinus rhythm and rate, no murmurs or rubs. Abdomen: soft, + BS, tender to palpation diffusely (stable). Incision: wound VAC in place to suction. JP drain with clear serous fluid in tubing, bulb empty. Dry dressing around drain. Ostomy with soft stool    Extremities: extremities warm and well perfused with trace edema, SCDs in place.  +/-  [facility].  Dr. Drusilla elicited tenderness on exam of her foot Gu: Purewick in place with clear yellow urine  Labs: No new data  Assessment: 78 y.o. s/p Procedure(s): LAPAROTOMY, EXPLORATORY SIGMOIDOSCOPY COLECTOMY, WITH COLOSTOMY CREATION MOBILIZATION, SPLENIC FLEXURE, WITH PARTIAL COLECTOMY CYSTOSCOPY.   ID: On broad spectrum abx for infectious process. Afebrile   FEN: Malnutrition: on TPN.  Some oral intake. Calorie count remains at a deficit    Heme: Acute on chronic anemia: asymptomatic.  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  Low suspicion for VTE Prophylaxis: SCDs, on lovenox     Plan: - Duplex dopplers of LE - Continue nutritional supplements. Continue calorie count. - Hospitalist input appreciated/plan to start Colchicine   - Continue broad spectrum abx - OOB w/assistance   LOS: 19 days    Olam Mill, MD 03/19/2024, 10:42 AM

## 2024-03-19 NOTE — Progress Notes (Signed)
 [name]  PROGRESS NOTE  Alexandria Durham FMW:[id number] DOB: May 21, 1946 DOA: 02/29/2024 PCP: [name], NP   Brief HPI:   78 y.o.  F with PMH significant for thickened endometrium, HTN, Type 2 DM, CKD stage IIIa, recent pancreatitis and acute cholecystitis s/p lap chole and D&C in May of this year who was admitted 7/1 for hysteroscopy and repeat D&C.  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 Pt was acidotic post-procedure, so decision made to leave intubated, PCCM consulted in this setting.  Significant events as below.  Patient was eventually transferred under [facility] to com[facility]age on 03/07/2024 while GYN oncology remains the primary service. Significant events Patient was admitted to the ICU postop intubated on the 7/2 extubated 7/ 4 patient transferred under [facility] for continuation of co management 7/8    Assessment/Plan:   Gout attack - Patient has tenderness at left first MTP joint - Avoid prednisone  due to diabetes melitis, will start colchicine  0.6 mg p.o. twice daily for 3 days - Will assess response   S/p D&C procedure complicated by bowel perforation S/p partial colectomy and ileostomy  Septic shock, shock resolved  Presented for d&c on 7/1 c/b bowel perforation. OR 7/2 after development of septic shock, zosyn  started. Had ex lap with sigmoidoscopy, partial colectomy  and ileostomy placement. CT AP 7/6 showing sigmoid diverticulitis, air-fluid level 4.6cm unable to discern abscess vs segment of colon.  Leukocytosis resolved Culture with E coli and Enterococcus sensitivity pending antibiotics changed to meropenem   Continue Zyvox  patient started on TPN.  Management per primary service.   Hyperkalemia  resolved after Lokelma   Hyponatremia - Improved to 135   Chest pain  7/6 complained of chest pressure. Trop x2 flat @ 22. EKG non  ischemic. CXR with no acute findings.  Monitor on telemetry.   AKI on CKD stage IIIa:  Resolved creatinine 0.84   Acute Thrombocytopenia  resolved   Type 2 DM  - On TPN -Continue resistant sliding scale with NovoLog  -Will add Lantus  5 units subcu daily -Continue Tradjenta    Hypothyroidism  - con't synthroid  75mcg daily     Hyperlipidemia  HTN - con't zetia  10mg  daily  - hydralazine  prn for SBP > 180      Respiratory insufficiency:  Resolved Required oxygen, chest x-ray showed no atelectasis   Echocardiogram  normal ejection fraction as below.   She received a dose of Lasix  40 mg x 1 yesterday.  Chest xray- 7/10  Interval placement of right arm PICC as described. No pneumothorax.Persistent low lung volumes with mild bibasilar atelectasis.   Echo 7/10- Left ventricular ejection fraction, by estimation, is 65 to 70%. The  left ventricle has normal function. The left ventricle has no regional  wall motion abnormalities. There is mild concentric left ventricular  hypertrophy. Left ventricular diastolic  parameters are consistent with Grade I diastolic dysfunction (impaired  relaxation).     Nutrition Problem: Severe Malnutrition Etiology: acute illness  Signs/Symptoms: energy intake < or equal to 50% for > or equal to 5 days, mild muscle depletion, percent weight loss (10% in 1.5 months) Percent weight loss: 10 % (in 1.5 months)    Medications     buPROPion   150 mg Oral QPM   Chlorhexidine  Gluconate Cloth  6 each Topical Daily   enoxaparin  (LOVENOX ) injection  40 mg Subcutaneous Q24H  ezetimibe   10 mg Oral Daily   feeding supplement ([employer] STANDARD 1.4)  325 mL Oral TID BM   gabapentin   300 mg Oral TID   insulin  aspart  0-20 Units Subcutaneous Q6H   levothyroxine   75 mcg Oral QAC breakfast   lidocaine   1 patch Transdermal Q24H   linagliptin   5 mg Oral Daily   linezolid   600 mg Oral Q12H   melatonin  3 mg Oral QHS   multivitamin with minerals  1 tablet  Oral Daily   pantoprazole   40 mg Oral QHS   polyethylene glycol  17 g Oral Daily   sodium chloride  flush  10-40 mL Intracatheter Q12H   sodium chloride  flush  10-40 mL Intracatheter Q12H     Data Reviewed:   CBG:  Recent Labs  Lab 03/18/24 1544 03/18/24 2011 03/18/24 2347 03/19/24 0604 03/19/24 0800  GLUCAP 156* 150* 159* 150* 152*    SpO2: 99 % O2 Flow Rate (L/min): 2 L/min FiO2 (%): 30 %    Vitals:   03/18/24 1219 03/18/24 2009 03/19/24 0500 03/19/24 0600  BP: 130/78 (!) 147/75  (!) 147/71  Pulse: 93 95  94  Resp: 16 18  18   Temp: 98.4 F (36.9 C) 98.2 F (36.8 C)  98.3 F (36.8 C)  TempSrc:  Oral  Oral  SpO2: 94% 95%  99%  Weight:   70.7 kg   Height:          Data Reviewed:  Basic Metabolic Panel: Recent Labs  Lab 03/13/24 0518 03/14/24 0539 03/15/24 0427 03/16/24 0438 03/17/24 0711 03/18/24 0612  NA 130* 127* 128* 132* 132* 135  K 4.7 4.0 4.0 3.9 4.2 4.1  CL 97* 98 97* 100 100 102  CO2 22 23 23 23 24 25   GLUCOSE 218* 204* 202* 178* 186* 159*  BUN 27* 27* 27* 25* 24* 23  CREATININE 0.80 0.82 0.78 0.89 0.83 0.86  CALCIUM  8.8* 8.1* 8.3* 8.6* 8.5* 8.5*  MG 1.8  --  1.8 2.0  --  2.0  PHOS 3.8  --  3.1 3.3  --  2.8    CBC: Recent Labs  Lab 03/13/24 1040 03/14/24 0539 03/15/24 0427 03/16/24 0438 03/17/24 0711  WBC 8.5 8.5 8.7 8.6 9.0  HGB 9.0* 8.2* 7.9* 7.8* 8.2*  HCT 27.0* 24.6* 23.4* 23.5* 25.1*  MCV 89.4 89.8 89.3 90.4 91.3  PLT 331 350 362 384 495*    LFT Recent Labs  Lab 03/13/24 0518 03/16/24 0438 03/17/24 0711  AST 90* 48* 59*  ALT 118* 86* 85*  ALKPHOS 144* 126 129*  BILITOT 0.5 0.7 0.5  PROT 5.6* 6.3* 6.4*  ALBUMIN  1.8* 1.8* 1.8*     Antibiotics: Anti-infectives (From admission, onward)    Start     Dose/Rate Route Frequency Ordered Stop   03/16/24 1230  meropenem  (MERREM ) 1 g in sodium chloride  0.9 % 100 mL IVPB        1 g 200 mL/hr over 30 Minutes Intravenous Every 8 hours 03/16/24 1134     03/14/24 1000   linezolid  (ZYVOX ) tablet 600 mg        600 mg Oral Every 12 hours 03/14/24 0903     03/14/24 0930  piperacillin -tazobactam (ZOSYN ) IVPB 3.375 g  Status:  Discontinued        3.375 g 12.5 mL/hr over 240 Minutes Intravenous Every 8 hours 03/14/24 0833 03/16/24 1134   03/13/24 1300  linezolid  (ZYVOX ) IVPB 600 mg  Status:  Discontinued  600 mg 300 mL/hr over 60 Minutes Intravenous Every 12 hours 03/13/24 1200 03/14/24 0903   03/13/24 1300  ceFEPIme  (MAXIPIME ) 2 g in sodium chloride  0.9 % 100 mL IVPB  Status:  Discontinued        2 g 200 mL/hr over 30 Minutes Intravenous Every 8 hours 03/13/24 1200 03/14/24 0833   03/10/24 1200  amoxicillin -clavulanate (AUGMENTIN ) 875-125 MG per tablet 1 tablet  Status:  Discontinued        1 tablet Oral Every 12 hours 03/10/24 1114 03/13/24 1200   03/01/24 1200  piperacillin -tazobactam (ZOSYN ) IVPB 3.375 g  Status:  Discontinued        3.375 g 12.5 mL/hr over 240 Minutes Intravenous Every 8 hours 03/01/24 0946 03/01/24 1116   03/01/24 1130  piperacillin -tazobactam (ZOSYN ) IVPB 3.375 g  Status:  Discontinued        3.375 g 12.5 mL/hr over 240 Minutes Intravenous Every 8 hours 03/01/24 1116 03/10/24 1114          Subjective   Complains of pain in the left foot.  She was diagnosed with gout by her PCP.  Objective    Physical Examination:  General-appears in no acute distress Heart-S1-S2, regular, no murmur auscultated Lungs-clear to auscultation bilaterally, no wheezing or crackles auscultated Abdomen-soft, nontender, no organomegaly Extremities-left foot, tender at first MTP joint Neuro-alert, oriented x3, no focal deficit noted   Status is: Inpatient:             Sabas GORMAN Brod   [facility] If 7PM-7AM, please contact night-coverage at [email or web address], Office  [phone]   03/19/2024, 8:28 AM  LOS: 19 days

## 2024-03-19 NOTE — Plan of Care (Signed)
  Problem: Clinical Measurements: Goal: Ability to maintain clinical measurements within normal limits will improve Outcome: Progressing Goal: Will remain free from infection Outcome: Progressing Goal: Diagnostic test results will improve Outcome: Progressing Goal: Respiratory complications will improve Outcome: Progressing Goal: Cardiovascular complication will be avoided Outcome: Progressing   Problem: Activity: Goal: Risk for activity intolerance will decrease Outcome: Progressing   Problem: Coping: Goal: Level of anxiety will decrease Outcome: Progressing   Problem: Elimination: Goal: Will not experience complications related to bowel motility Outcome: Progressing Goal: Will not experience complications related to urinary retention Outcome: Progressing   Problem: Pain Managment: Goal: General experience of comfort will improve and/or be controlled Outcome: Progressing   Problem: Safety: Goal: Ability to remain free from injury will improve Outcome: Progressing   Problem: Skin Integrity: Goal: Risk for impaired skin integrity will decrease Outcome: Progressing   Problem: Education: Goal: Ability to describe self-care measures that may prevent or decrease complications (Diabetes Survival Skills Education) will improve Outcome: Progressing Goal: Individualized Educational Video(s) Outcome: Progressing   Problem: Coping: Goal: Ability to adjust to condition or change in health will improve Outcome: Progressing   Problem: Fluid Volume: Goal: Ability to maintain a balanced intake and output will improve Outcome: Progressing   Problem: Health Behavior/Discharge Planning: Goal: Ability to identify and utilize available resources and services will improve Outcome: Progressing Goal: Ability to manage health-related needs will improve Outcome: Progressing   Problem: Metabolic: Goal: Ability to maintain appropriate glucose levels will improve Outcome: Progressing    Problem: Nutritional: Goal: Maintenance of adequate nutrition will improve Outcome: Progressing Goal: Progress toward achieving an optimal weight will improve Outcome: Progressing   Problem: Skin Integrity: Goal: Risk for impaired skin integrity will decrease Outcome: Progressing   Problem: Tissue Perfusion: Goal: Adequacy of tissue perfusion will improve Outcome: Progressing   Problem: Education: Goal: Knowledge of the prescribed therapeutic regimen will improve Outcome: Progressing Goal: Understanding of sexual limitations or changes related to disease process or condition will improve Outcome: Progressing Goal: Individualized Educational Video(s) Outcome: Progressing   Problem: Self-Concept: Goal: Communication of feelings regarding changes in body function or appearance will improve Outcome: Progressing   Problem: Skin Integrity: Goal: Demonstration of wound healing without infection will improve Outcome: Progressing   Problem: Activity: Goal: Ability to tolerate increased activity will improve Outcome: Progressing   Problem: Respiratory: Goal: Ability to maintain a clear airway and adequate ventilation will improve Outcome: Progressing   Problem: Role Relationship: Goal: Method of communication will improve Outcome: Progressing

## 2024-03-19 NOTE — Plan of Care (Signed)
 Problem: Clinical Measurements: Goal: Ability to maintain clinical measurements within normal limits will improve 03/19/2024 0600 by Nicholaus Raiford RAMAN, RN Outcome: Progressing 03/19/2024 0600 by Nicholaus Raiford RAMAN, RN Outcome: Progressing 03/19/2024 0559 by Nicholaus Raiford RAMAN, RN Outcome: Progressing Goal: Will remain free from infection 03/19/2024 0600 by Nicholaus Raiford RAMAN, RN Outcome: Progressing 03/19/2024 0600 by Nicholaus Raiford RAMAN, RN Outcome: Progressing 03/19/2024 0559 by Nicholaus Raiford RAMAN, RN Outcome: Progressing Goal: Diagnostic test results will improve 03/19/2024 0600 by Nicholaus Raiford RAMAN, RN Outcome: Progressing 03/19/2024 0600 by Nicholaus Raiford RAMAN, RN Outcome: Progressing 03/19/2024 0559 by Nicholaus Raiford RAMAN, RN Outcome: Progressing Goal: Respiratory complications will improve 03/19/2024 0600 by Nicholaus Raiford RAMAN, RN Outcome: Progressing 03/19/2024 0600 by Nicholaus Raiford RAMAN, RN Outcome: Progressing 03/19/2024 0559 by Nicholaus Raiford RAMAN, RN Outcome: Progressing Goal: Cardiovascular complication will be avoided 03/19/2024 0600 by Nicholaus Raiford RAMAN, RN Outcome: Progressing 03/19/2024 0600 by Nicholaus Raiford RAMAN, RN Outcome: Progressing 03/19/2024 0559 by Nicholaus Raiford RAMAN, RN Outcome: Progressing   Problem: Activity: Goal: Risk for activity intolerance will decrease 03/19/2024 0600 by Nicholaus Raiford RAMAN, RN Outcome: Progressing 03/19/2024 0600 by Nicholaus Raiford RAMAN, RN Outcome: Progressing 03/19/2024 0559 by Nicholaus Raiford RAMAN, RN Outcome: Progressing   Problem: Coping: Goal: Level of anxiety will decrease 03/19/2024 0600 by Nicholaus Raiford RAMAN, RN Outcome: Progressing 03/19/2024 0600 by Nicholaus Raiford RAMAN, RN Outcome: Progressing 03/19/2024 0559 by Nicholaus Raiford RAMAN, RN Outcome: Progressing   Problem: Elimination: Goal: Will not experience complications related to bowel motility 03/19/2024 0600 by Nicholaus Raiford RAMAN, RN Outcome: Progressing 03/19/2024 0600 by Nicholaus Raiford RAMAN, RN Outcome:  Progressing 03/19/2024 0559 by Nicholaus Raiford RAMAN, RN Outcome: Progressing Goal: Will not experience complications related to urinary retention 03/19/2024 0600 by Nicholaus Raiford RAMAN, RN Outcome: Progressing 03/19/2024 0600 by Nicholaus Raiford RAMAN, RN Outcome: Progressing 03/19/2024 0559 by Nicholaus Raiford RAMAN, RN Outcome: Progressing   Problem: Pain Managment: Goal: General experience of comfort will improve and/or be controlled 03/19/2024 0600 by Nicholaus Raiford RAMAN, RN Outcome: Progressing 03/19/2024 0600 by Nicholaus Raiford RAMAN, RN Outcome: Progressing 03/19/2024 0559 by Nicholaus Raiford RAMAN, RN Outcome: Progressing   Problem: Safety: Goal: Ability to remain free from injury will improve 03/19/2024 0600 by Nicholaus Raiford RAMAN, RN Outcome: Progressing 03/19/2024 0600 by Nicholaus Raiford RAMAN, RN Outcome: Progressing 03/19/2024 0559 by Nicholaus Raiford RAMAN, RN Outcome: Progressing   Problem: Skin Integrity: Goal: Risk for impaired skin integrity will decrease 03/19/2024 0600 by Nicholaus Raiford RAMAN, RN Outcome: Progressing 03/19/2024 0600 by Nicholaus Raiford RAMAN, RN Outcome: Progressing 03/19/2024 0559 by Nicholaus Raiford RAMAN, RN Outcome: Progressing   Problem: Education: Goal: Ability to describe self-care measures that may prevent or decrease complications (Diabetes Survival Skills Education) will improve 03/19/2024 0600 by Nicholaus Raiford RAMAN, RN Outcome: Progressing 03/19/2024 0600 by Nicholaus Raiford RAMAN, RN Outcome: Progressing 03/19/2024 0559 by Nicholaus Raiford RAMAN, RN Outcome: Progressing Goal: Individualized Educational Video(s) 03/19/2024 0600 by Nicholaus Raiford RAMAN, RN Outcome: Progressing 03/19/2024 0600 by Nicholaus Raiford RAMAN, RN Outcome: Progressing 03/19/2024 0559 by Nicholaus Raiford RAMAN, RN Outcome: Progressing   Problem: Coping: Goal: Ability to adjust to condition or change in health will improve 03/19/2024 0600 by Nicholaus Raiford RAMAN, RN Outcome: Progressing 03/19/2024 0600 by Nicholaus Raiford RAMAN, RN Outcome:  Progressing 03/19/2024 0559 by Nicholaus Raiford RAMAN, RN Outcome: Progressing   Problem: Fluid Volume: Goal: Ability to maintain a balanced intake and output will improve 03/19/2024 0600 by Nicholaus Raiford RAMAN, RN Outcome: Progressing 03/19/2024 0600 by Nicholaus Raiford  S, RN Outcome: Progressing 03/19/2024 0559 by Nicholaus Raiford RAMAN, RN Outcome: Progressing   Problem: Health Behavior/Discharge Planning: Goal: Ability to identify and utilize available resources and services will improve 03/19/2024 0600 by Nicholaus Raiford RAMAN, RN Outcome: Progressing 03/19/2024 0600 by Nicholaus Raiford RAMAN, RN Outcome: Progressing 03/19/2024 0559 by Nicholaus Raiford RAMAN, RN Outcome: Progressing Goal: Ability to manage health-related needs will improve 03/19/2024 0600 by Nicholaus Raiford RAMAN, RN Outcome: Progressing 03/19/2024 0600 by Nicholaus Raiford RAMAN, RN Outcome: Progressing 03/19/2024 0559 by Nicholaus Raiford RAMAN, RN Outcome: Progressing   Problem: Metabolic: Goal: Ability to maintain appropriate glucose levels will improve 03/19/2024 0600 by Nicholaus Raiford RAMAN, RN Outcome: Progressing 03/19/2024 0600 by Nicholaus Raiford RAMAN, RN Outcome: Progressing 03/19/2024 0559 by Nicholaus Raiford RAMAN, RN Outcome: Progressing   Problem: Nutritional: Goal: Maintenance of adequate nutrition will improve 03/19/2024 0600 by Nicholaus Raiford RAMAN, RN Outcome: Progressing 03/19/2024 0600 by Nicholaus Raiford RAMAN, RN Outcome: Progressing 03/19/2024 0559 by Nicholaus Raiford RAMAN, RN Outcome: Progressing Goal: Progress toward achieving an optimal weight will improve 03/19/2024 0600 by Nicholaus Raiford RAMAN, RN Outcome: Progressing 03/19/2024 0600 by Nicholaus Raiford RAMAN, RN Outcome: Progressing 03/19/2024 0559 by Nicholaus Raiford RAMAN, RN Outcome: Progressing   Problem: Skin Integrity: Goal: Risk for impaired skin integrity will decrease 03/19/2024 0600 by Nicholaus Raiford RAMAN, RN Outcome: Progressing 03/19/2024 0600 by Nicholaus Raiford RAMAN, RN Outcome: Progressing 03/19/2024 0559 by Nicholaus Raiford RAMAN, RN Outcome: Progressing   Problem: Tissue Perfusion: Goal: Adequacy of tissue perfusion will improve 03/19/2024 0600 by Nicholaus Raiford RAMAN, RN Outcome: Progressing 03/19/2024 0600 by Nicholaus Raiford RAMAN, RN Outcome: Progressing 03/19/2024 0559 by Nicholaus Raiford RAMAN, RN Outcome: Progressing   Problem: Education: Goal: Knowledge of the prescribed therapeutic regimen will improve 03/19/2024 0600 by Nicholaus Raiford RAMAN, RN Outcome: Progressing 03/19/2024 0600 by Nicholaus Raiford RAMAN, RN Outcome: Progressing 03/19/2024 0559 by Nicholaus Raiford RAMAN, RN Outcome: Progressing Goal: Understanding of sexual limitations or changes related to disease process or condition will improve 03/19/2024 0600 by Nicholaus Raiford RAMAN, RN Outcome: Progressing 03/19/2024 0600 by Nicholaus Raiford RAMAN, RN Outcome: Progressing 03/19/2024 0559 by Nicholaus Raiford RAMAN, RN Outcome: Progressing Goal: Individualized Educational Video(s) 03/19/2024 0600 by Nicholaus Raiford RAMAN, RN Outcome: Progressing 03/19/2024 0600 by Nicholaus Raiford RAMAN, RN Outcome: Progressing 03/19/2024 0559 by Nicholaus Raiford RAMAN, RN Outcome: Progressing   Problem: Self-Concept: Goal: Communication of feelings regarding changes in body function or appearance will improve 03/19/2024 0600 by Nicholaus Raiford RAMAN, RN Outcome: Progressing 03/19/2024 0600 by Nicholaus Raiford RAMAN, RN Outcome: Progressing 03/19/2024 0559 by Nicholaus Raiford RAMAN, RN Outcome: Progressing   Problem: Skin Integrity: Goal: Demonstration of wound healing without infection will improve 03/19/2024 0600 by Nicholaus Raiford RAMAN, RN Outcome: Progressing 03/19/2024 0600 by Nicholaus Raiford RAMAN, RN Outcome: Progressing 03/19/2024 0559 by Nicholaus Raiford RAMAN, RN Outcome: Progressing   Problem: Activity: Goal: Ability to tolerate increased activity will improve 03/19/2024 0600 by Nicholaus Raiford RAMAN, RN Outcome: Progressing 03/19/2024 0600 by Nicholaus Raiford RAMAN, RN Outcome: Progressing 03/19/2024 0559 by Nicholaus Raiford RAMAN, RN Outcome:  Progressing   Problem: Respiratory: Goal: Ability to maintain a clear airway and adequate ventilation will improve 03/19/2024 0600 by Nicholaus Raiford RAMAN, RN Outcome: Progressing 03/19/2024 0600 by Nicholaus Raiford RAMAN, RN Outcome: Progressing 03/19/2024 0559 by Nicholaus Raiford RAMAN, RN Outcome: Progressing   Problem: Role Relationship: Goal: Method of communication will improve 03/19/2024 0600 by Nicholaus Raiford RAMAN, RN Outcome: Progressing 03/19/2024 0600 by Nicholaus Raiford RAMAN, RN Outcome: Progressing 03/19/2024 0559 by  Nicholaus Raiford RAMAN, RN Outcome: Progressing

## 2024-03-19 NOTE — Plan of Care (Signed)
  Problem: Nutritional: Goal: Maintenance of adequate nutrition will improve Outcome: Not Progressing Goal: Progress toward achieving an optimal weight will improve Outcome: Not Progressing   Problem: Clinical Measurements: Goal: Ability to maintain clinical measurements within normal limits will improve Outcome: Progressing Goal: Will remain free from infection Outcome: Progressing Goal: Diagnostic test results will improve Outcome: Progressing Goal: Respiratory complications will improve Outcome: Progressing Goal: Cardiovascular complication will be avoided Outcome: Progressing   Problem: Activity: Goal: Risk for activity intolerance will decrease Outcome: Progressing   Problem: Coping: Goal: Level of anxiety will decrease Outcome: Progressing   Problem: Elimination: Goal: Will not experience complications related to bowel motility Outcome: Progressing Goal: Will not experience complications related to urinary retention Outcome: Progressing   Problem: Pain Managment: Goal: General experience of comfort will improve and/or be controlled Outcome: Progressing   Problem: Safety: Goal: Ability to remain free from injury will improve Outcome: Progressing   Problem: Skin Integrity: Goal: Risk for impaired skin integrity will decrease Outcome: Progressing   Problem: Education: Goal: Ability to describe self-care measures that may prevent or decrease complications (Diabetes Survival Skills Education) will improve Outcome: Progressing Goal: Individualized Educational Video(s) Outcome: Progressing   Problem: Coping: Goal: Ability to adjust to condition or change in health will improve Outcome: Progressing   Problem: Fluid Volume: Goal: Ability to maintain a balanced intake and output will improve Outcome: Progressing   Problem: Health Behavior/Discharge Planning: Goal: Ability to identify and utilize available resources and services will improve Outcome:  Progressing Goal: Ability to manage health-related needs will improve Outcome: Progressing   Problem: Metabolic: Goal: Ability to maintain appropriate glucose levels will improve Outcome: Progressing   Problem: Skin Integrity: Goal: Risk for impaired skin integrity will decrease Outcome: Progressing   Problem: Tissue Perfusion: Goal: Adequacy of tissue perfusion will improve Outcome: Progressing   Problem: Education: Goal: Knowledge of the prescribed therapeutic regimen will improve Outcome: Progressing Goal: Understanding of sexual limitations or changes related to disease process or condition will improve Outcome: Progressing Goal: Individualized Educational Video(s) Outcome: Progressing   Problem: Self-Concept: Goal: Communication of feelings regarding changes in body function or appearance will improve Outcome: Progressing   Problem: Skin Integrity: Goal: Demonstration of wound healing without infection will improve Outcome: Progressing   Problem: Activity: Goal: Ability to tolerate increased activity will improve Outcome: Progressing   Problem: Respiratory: Goal: Ability to maintain a clear airway and adequate ventilation will improve Outcome: Progressing   Problem: Role Relationship: Goal: Method of communication will improve Outcome: Progressing

## 2024-03-20 ENCOUNTER — Encounter: Admitting: Psychiatry

## 2024-03-20 ENCOUNTER — Inpatient Hospital Stay (HOSPITAL_COMMUNITY)

## 2024-03-20 DIAGNOSIS — E43 Unspecified severe protein-calorie malnutrition: Secondary | ICD-10-CM

## 2024-03-20 DIAGNOSIS — M79606 Pain in leg, unspecified: Secondary | ICD-10-CM | POA: Diagnosis not present

## 2024-03-20 DIAGNOSIS — R1030 Lower abdominal pain, unspecified: Secondary | ICD-10-CM

## 2024-03-20 DIAGNOSIS — N950 Postmenopausal bleeding: Secondary | ICD-10-CM | POA: Diagnosis not present

## 2024-03-20 DIAGNOSIS — R1084 Generalized abdominal pain: Secondary | ICD-10-CM | POA: Diagnosis not present

## 2024-03-20 LAB — GLUCOSE, CAPILLARY
Glucose-Capillary: 136 mg/dL — ABNORMAL HIGH (ref 70–99)
Glucose-Capillary: 140 mg/dL — ABNORMAL HIGH (ref 70–99)
Glucose-Capillary: 143 mg/dL — ABNORMAL HIGH (ref 70–99)
Glucose-Capillary: 143 mg/dL — ABNORMAL HIGH (ref 70–99)
Glucose-Capillary: 147 mg/dL — ABNORMAL HIGH (ref 70–99)
Glucose-Capillary: 159 mg/dL — ABNORMAL HIGH (ref 70–99)
Glucose-Capillary: 159 mg/dL — ABNORMAL HIGH (ref 70–99)
Glucose-Capillary: 187 mg/dL — ABNORMAL HIGH (ref 70–99)

## 2024-03-20 LAB — COMPREHENSIVE METABOLIC PANEL WITH GFR
ALT: 68 U/L — ABNORMAL HIGH (ref 0–44)
AST: 44 U/L — ABNORMAL HIGH (ref 15–41)
Albumin: 1.7 g/dL — ABNORMAL LOW (ref 3.5–5.0)
Alkaline Phosphatase: 100 U/L (ref 38–126)
Anion gap: 7 (ref 5–15)
BUN: 22 mg/dL (ref 8–23)
CO2: 23 mmol/L (ref 22–32)
Calcium: 8.2 mg/dL — ABNORMAL LOW (ref 8.9–10.3)
Chloride: 104 mmol/L (ref 98–111)
Creatinine, Ser: 0.77 mg/dL (ref 0.44–1.00)
GFR, Estimated: >60 mL/min (ref >60)
Glucose, Bld: 149 mg/dL — ABNORMAL HIGH (ref 70–99)
Potassium: 4.1 mmol/L (ref 3.5–5.1)
Sodium: 134 mmol/L — ABNORMAL LOW (ref 135–145)
Total Bilirubin: 0.6 mg/dL (ref 0.0–1.2)
Total Protein: 6 g/dL — ABNORMAL LOW (ref 6.5–8.1)

## 2024-03-20 LAB — MAGNESIUM: Magnesium: 1.9 mg/dL (ref 1.7–2.4)

## 2024-03-20 LAB — CBC
HCT: 25.4 % — ABNORMAL LOW (ref 36.0–46.0)
Hemoglobin: 8.1 g/dL — ABNORMAL LOW (ref 12.0–15.0)
MCH: 29.2 pg (ref 26.0–34.0)
MCHC: 31.9 g/dL (ref 30.0–36.0)
MCV: 91.7 fL (ref 80.0–100.0)
Platelets: 535 K/uL — ABNORMAL HIGH (ref 150–400)
RBC: 2.77 MIL/uL — ABNORMAL LOW (ref 3.87–5.11)
RDW: 16.7 % — ABNORMAL HIGH (ref 11.5–15.5)
WBC: 8.9 K/uL (ref 4.0–10.5)
nRBC: 0.2 % (ref 0.0–0.2)

## 2024-03-20 LAB — PHOSPHORUS: Phosphorus: 3.4 mg/dL (ref 2.5–4.6)

## 2024-03-20 LAB — TRIGLYCERIDES: Triglycerides: 220 mg/dL — ABNORMAL HIGH (ref <150)

## 2024-03-20 MED ORDER — TRAVASOL 10 % IV SOLN
INTRAVENOUS | Status: DC
  Filled 2024-03-20: qty 696

## 2024-03-20 NOTE — Progress Notes (Signed)
 [name]                                                                               Alexandria Durham, is a 78 y.o. female, DOB - November 12, 1945, FMW:[id number] Admit date - 02/29/2024    Outpatient Primary MD for the patient is Alexandria Jacob, NP  LOS - 20  days    Brief summary   78 y.o.  F with PMH significant for thickened endometrium, HTN, Type 2 DM, CKD stage IIIa, recent pancreatitis and acute cholecystitis s/p lap chole and D&C in May of this year who was admitted 7/1 for hysteroscopy and repeat D&C.  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 Pt was acidotic post-procedure, so decision made to leave intubated, PCCM consulted in this setting.  Significant events as below.  Patient was eventually transferred under [facility] to comanage on 03/07/2024 while GYN oncology remains the primary service. Significant events Patient was admitted to the ICU postop intubated on the 7/2 extubated 7/ 4   Assessment & Plan    Assessment and Plan:  Left foot gouty attack On colchicine  0.6 mg BID. Ambulate as tolerated.    S/p D&C procedure complicated by bowel perforation S/p partial colectomy and ileostomy  Septic shock, shock resolved  Presented for d&c on 7/1 c/b bowel perforation. OR 7/2 after development of septic shock, zosyn  started. Had ex lap with sigmoidoscopy, partial colectomy  and ileostomy placement. CT AP 7/6 showing sigmoid diverticulitis, air-fluid level 4.6cm unable to discern abscess vs segment of colon.  Leukocytosis resolved Culture with E coli and Enterococcus sensitivity pending antibiotics changed to meropenem   Continue Zyvox  patient started on TPN.  Management per primary service.   Type 2 DM CBG (last 3)  Recent Labs    03/20/24 0522 03/20/24 1152 03/20/24 1654  GLUCAP 140* 159* 143*   Resume SSI.     Hypertension BP parameters are sub optimal.  Added hydralazine .    Hypothyroidism Resume synthroid .    Hyperlipidemia Resume zetia .   Acute on stage 3a CKD RESOLVED.    Chest pain  Resolved.    Hyponatremia Resolved.    Hyperkalemia Resolved with lokelma .    RN Pressure Injury Documentation:    Malnutrition Type:  Nutrition Problem: Severe Malnutrition Etiology: acute illness   Malnutrition Characteristics:  Signs/Symptoms: energy intake < or equal to 50% for > or equal to 5 days, mild muscle depletion, percent weight loss (10% in 1.5 months) Percent weight loss: 10 % (in 1.5 months)   Nutrition Interventions:  Interventions: Refer to RD note for recommendations  Estimated body mass index is 26.75 kg/m as calculated from the following:   Height as of this encounter: 5' 4 (1.626 m).   Weight as of this encounter: 70.7 kg.   Antimicrobials:   Anti-infectives (From admission, onward)    Start     Dose/Rate Route Frequency Ordered Stop   03/16/24 1230  meropenem  (MERREM ) 1 g in sodium chloride  0.9 % 100 mL IVPB  1 g 200 mL/hr over 30 Minutes Intravenous Every 8 hours 03/16/24 1134     03/14/24 1000  linezolid  (ZYVOX ) tablet 600 mg        600 mg Oral Every 12 hours 03/14/24 0903     03/14/24 0930  piperacillin -tazobactam (ZOSYN ) IVPB 3.375 g  Status:  Discontinued        3.375 g 12.5 mL/hr over 240 Minutes Intravenous Every 8 hours 03/14/24 0833 03/16/24 1134   03/13/24 1300  linezolid  (ZYVOX ) IVPB 600 mg  Status:  Discontinued        600 mg 300 mL/hr over 60 Minutes Intravenous Every 12 hours 03/13/24 1200 03/14/24 0903   03/13/24 1300  ceFEPIme  (MAXIPIME ) 2 g in sodium chloride  0.9 % 100 mL IVPB  Status:  Discontinued        2 g 200 mL/hr over 30 Minutes Intravenous Every 8 hours 03/13/24 1200 03/14/24 0833   03/10/24 1200  amoxicillin -clavulanate (AUGMENTIN ) 875-125 MG per tablet 1 tablet  Status:  Discontinued        1 tablet Oral  Every 12 hours 03/10/24 1114 03/13/24 1200   03/01/24 1200  piperacillin -tazobactam (ZOSYN ) IVPB 3.375 g  Status:  Discontinued        3.375 g 12.5 mL/hr over 240 Minutes Intravenous Every 8 hours 03/01/24 0946 03/01/24 1116   03/01/24 1130  piperacillin -tazobactam (ZOSYN ) IVPB 3.375 g  Status:  Discontinued        3.375 g 12.5 mL/hr over 240 Minutes Intravenous Every 8 hours 03/01/24 1116 03/10/24 1114        Medications  Scheduled Meds:  buPROPion   150 mg Oral QPM   Chlorhexidine  Gluconate Cloth  6 each Topical Daily   colchicine   0.6 mg Oral BID   enoxaparin  (LOVENOX ) injection  40 mg Subcutaneous Q24H   ezetimibe   10 mg Oral Daily   feeding supplement ([employer] STANDARD 1.4)  325 mL Oral TID BM   gabapentin   300 mg Oral TID   insulin  aspart  0-20 Units Subcutaneous Q6H   levothyroxine   75 mcg Oral QAC breakfast   lidocaine   1 patch Transdermal Q24H   linagliptin   5 mg Oral Daily   linezolid   600 mg Oral Q12H   melatonin  3 mg Oral QHS   multivitamin with minerals  1 tablet Oral Daily   pantoprazole   40 mg Oral QHS   polyethylene glycol  17 g Oral Daily   sodium chloride  flush  10-40 mL Intracatheter Q12H   sodium chloride  flush  10-40 mL Intracatheter Q12H   Continuous Infusions:  meropenem  (MERREM ) IV Stopped (03/20/24 1402)   TPN ADULT (ION) 25 mL/hr at 03/20/24 1552   PRN Meds:.hydrALAZINE , [DISCONTINUED]  HYDROmorphone  (DILAUDID ) injection **OR** HYDROmorphone  (DILAUDID ) injection, labetalol , methocarbamol , ondansetron  **OR** ondansetron  (ZOFRAN ) IV, mouth rinse, oxyCODONE , simethicone , sodium chloride  flush, sodium chloride  flush    Subjective:   Alexandria Durham was seen and examined today. Pain is better.   Objective:   Vitals:   03/19/24 1155 03/19/24 1945 03/20/24 0500 03/20/24 1425  BP: 131/73 (!) 124/96 (!) 168/91 (!) 162/72  Pulse: 95 87 86 91  Resp: 20 17 18 16   Temp: 98 F (36.7 C) 98.7 F (37.1 C) 97.9 F (36.6 C) 98.6 F (37 C)  TempSrc:  Oral Oral Oral   SpO2: 95% 95% 95% 99%  Weight:      Height:        Intake/Output Summary (Last 24 hours) at 03/20/2024 1702 Last data filed at 03/20/2024  1501 Gross per 24 hour  Intake 1643.82 ml  Output 3660 ml  Net -2016.18 ml   Filed Weights   03/17/24 0500 03/18/24 0500 03/19/24 0500  Weight: 76.1 kg 76.3 kg 70.7 kg     Exam General exam: Appears calm and comfortable  Respiratory system: Clear to auscultation. Respiratory effort normal. Cardiovascular system: S1 & S2 heard, RRR. Gastrointestinal system: Abdomen is soft bs+ Central nervous system: Alert and oriented. No focal neurological deficits. Extremities: Symmetric 5 x 5 power. Skin: No rashes,  Psychiatry:  Mood & affect appropriate.     Data Reviewed:  I have personally reviewed following labs and imaging studies   CBC Lab Results  Component Value Date   WBC 8.9 03/20/2024   RBC 2.77 (L) 03/20/2024   HGB 8.1 (L) 03/20/2024   HCT 25.4 (L) 03/20/2024   MCV 91.7 03/20/2024   MCH 29.2 03/20/2024   PLT 535 (H) 03/20/2024   MCHC 31.9 03/20/2024   RDW 16.7 (H) 03/20/2024   LYMPHSABS 1.8 02/15/2024   MONOABS 0.6 02/15/2024   EOSABS 0.1 02/15/2024   BASOSABS 0.0 02/15/2024     Last metabolic panel Lab Results  Component Value Date   NA 134 (L) 03/20/2024   K 4.1 03/20/2024   CL 104 03/20/2024   CO2 23 03/20/2024   BUN 22 03/20/2024   CREATININE 0.77 03/20/2024   GLUCOSE 149 (H) 03/20/2024   GFRNONAA >60 03/20/2024   GFRAA 51 (L) 03/08/2016   CALCIUM  8.2 (L) 03/20/2024   PHOS 3.4 03/20/2024   PROT 6.0 (L) 03/20/2024   ALBUMIN  1.7 (L) 03/20/2024   BILITOT 0.6 03/20/2024   ALKPHOS 100 03/20/2024   AST 44 (H) 03/20/2024   ALT 68 (H) 03/20/2024   ANIONGAP 7 03/20/2024    CBG (last 3)  Recent Labs    03/20/24 0522 03/20/24 1152 03/20/24 1654  GLUCAP 140* 159* 143*      Coagulation Profile: No results for input(s): INR, PROTIME in the last 168 hours.   Radiology Studies: VAS US   LOWER EXTREMITY VENOUS (DVT) Result Date: 03/20/2024  Lower Venous DVT Study Patient Name:  RAYN SHORB  Date of Exam:   03/20/2024 Medical Rec #: [id number]       Accession #:    [id number] Date of Birth: 02/17/1946       Patient Gender: F Patient Age:   69 years Exam Location:  [facility] Procedure:      VAS US  LOWER EXTREMITY VENOUS (DVT) Referring Phys: OLAM MILL --------------------------------------------------------------------------------  Indications: Pain, and Left lower extremity gout.  Risk Factors: Immobility Status post multiple laparoscopic surgeries this admission including Hysteroscopy with myosure, laparoscopy of the pelvis, abdomen, and sigmoidoscopy of the rectum, colectomy, colostomy creation 02/29/2024 and 03/01/2024. Limitations: Poor ultrasound/tissue interface and edema. Comparison Study: No prior study on file Performing Technologist: Alberta Lis RVS  Examination Guidelines: A complete evaluation includes B-mode imaging, spectral Doppler, color Doppler, and power Doppler as needed of all accessible portions of each vessel. Bilateral testing is considered an integral part of a complete examination. Limited examinations for reoccurring indications may be performed as noted. The reflux portion of the exam is performed with the patient in reverse Trendelenburg.  +---------+---------------+---------+-----------+----------+-------------------+ RIGHT    CompressibilityPhasicitySpontaneityPropertiesThrombus Aging      +---------+---------------+---------+-----------+----------+-------------------+ CFV      Full           Yes      Yes                                      +---------+---------------+---------+-----------+----------+-------------------+  SFJ      Full                                                             +---------+---------------+---------+-----------+----------+-------------------+ FV Prox  Full           Yes      Yes                                       +---------+---------------+---------+-----------+----------+-------------------+ FV Mid   Full                                                             +---------+---------------+---------+-----------+----------+-------------------+ FV DistalFull                                                             +---------+---------------+---------+-----------+----------+-------------------+ PFV      Full           Yes      Yes                                      +---------+---------------+---------+-----------+----------+-------------------+ POP      Full           Yes      Yes                                      +---------+---------------+---------+-----------+----------+-------------------+ PTV      Full                                         Not well visualized +---------+---------------+---------+-----------+----------+-------------------+ PERO                                                  Not well visualized +---------+---------------+---------+-----------+----------+-------------------+   +---------+---------------+---------+-----------+----------+-------------------+ LEFT     CompressibilityPhasicitySpontaneityPropertiesThrombus Aging      +---------+---------------+---------+-----------+----------+-------------------+ CFV      Full           Yes      Yes                  Rouleaux flow       +---------+---------------+---------+-----------+----------+-------------------+ SFJ      Full           Yes      Yes                  Rouleaux flow       +---------+---------------+---------+-----------+----------+-------------------+  FV Prox  Full           Yes      Yes                                      +---------+---------------+---------+-----------+----------+-------------------+ FV Mid   Full           Yes      Yes                                       +---------+---------------+---------+-----------+----------+-------------------+ FV DistalFull                                                             +---------+---------------+---------+-----------+----------+-------------------+ PFV      Full           Yes      Yes                                      +---------+---------------+---------+-----------+----------+-------------------+ POP      Full           Yes      Yes                                      +---------+---------------+---------+-----------+----------+-------------------+ PTV      Full                                         Not well visualized +---------+---------------+---------+-----------+----------+-------------------+ PERO                                                  Not well visualized +---------+---------------+---------+-----------+----------+-------------------+     Summary: RIGHT: - There is no evidence of deep vein thrombosis in the lower extremity.  - No cystic structure found in the popliteal fossa. - Ultrasound characteristics of enlarged lymph nodes are noted in the groin.  LEFT: - There is no evidence of deep vein thrombosis in the lower extremity.  - No cystic structure found in the popliteal fossa. - Ultrasound characteristics of enlarged lymph nodes noted in the groin.  *See table(s) above for measurements and observations. Electronically signed by Lonni Gaskins MD on 03/20/2024 at 3:13:13 PM.    Final        Elgie Butter M.D. [name] 03/20/2024, 5:02 PM  Available via Epic secure chat 7am-7pm After 7 pm, please refer to night coverage provider listed on amion.

## 2024-03-20 NOTE — Plan of Care (Signed)
  Problem: Clinical Measurements: Goal: Ability to maintain clinical measurements within normal limits will improve Outcome: Progressing Goal: Will remain free from infection Outcome: Progressing Goal: Diagnostic test results will improve Outcome: Progressing Goal: Respiratory complications will improve Outcome: Progressing Goal: Cardiovascular complication will be avoided Outcome: Progressing   Problem: Activity: Goal: Risk for activity intolerance will decrease Outcome: Progressing   Problem: Coping: Goal: Level of anxiety will decrease Outcome: Progressing   Problem: Elimination: Goal: Will not experience complications related to bowel motility Outcome: Progressing Goal: Will not experience complications related to urinary retention Outcome: Progressing   Problem: Pain Managment: Goal: General experience of comfort will improve and/or be controlled Outcome: Progressing   Problem: Safety: Goal: Ability to remain free from injury will improve Outcome: Progressing   Problem: Skin Integrity: Goal: Risk for impaired skin integrity will decrease Outcome: Progressing   Problem: Education: Goal: Ability to describe self-care measures that may prevent or decrease complications (Diabetes Survival Skills Education) will improve Outcome: Progressing Goal: Individualized Educational Video(s) Outcome: Progressing   Problem: Coping: Goal: Ability to adjust to condition or change in health will improve Outcome: Progressing   Problem: Fluid Volume: Goal: Ability to maintain a balanced intake and output will improve Outcome: Progressing   Problem: Health Behavior/Discharge Planning: Goal: Ability to identify and utilize available resources and services will improve Outcome: Progressing Goal: Ability to manage health-related needs will improve Outcome: Progressing   Problem: Metabolic: Goal: Ability to maintain appropriate glucose levels will improve Outcome: Progressing    Problem: Nutritional: Goal: Maintenance of adequate nutrition will improve Outcome: Progressing Goal: Progress toward achieving an optimal weight will improve Outcome: Progressing   Problem: Skin Integrity: Goal: Risk for impaired skin integrity will decrease Outcome: Progressing   Problem: Tissue Perfusion: Goal: Adequacy of tissue perfusion will improve Outcome: Progressing   Problem: Education: Goal: Knowledge of the prescribed therapeutic regimen will improve Outcome: Progressing Goal: Understanding of sexual limitations or changes related to disease process or condition will improve Outcome: Progressing Goal: Individualized Educational Video(s) Outcome: Progressing   Problem: Self-Concept: Goal: Communication of feelings regarding changes in body function or appearance will improve Outcome: Progressing   Problem: Skin Integrity: Goal: Demonstration of wound healing without infection will improve Outcome: Progressing   Problem: Activity: Goal: Ability to tolerate increased activity will improve Outcome: Progressing   Problem: Respiratory: Goal: Ability to maintain a clear airway and adequate ventilation will improve Outcome: Progressing   Problem: Role Relationship: Goal: Method of communication will improve Outcome: Progressing

## 2024-03-20 NOTE — Progress Notes (Addendum)
 PHARMACY - TOTAL PARENTERAL NUTRITION CONSULT NOTE   Indication: Prolonged ileus  Patient Measurements: Height: 5' 4 (162.6 cm) Weight: 70.7 kg (155 lb 13.8 oz) IBW/kg (Calculated) : 54.7 TPN AdjBW (KG): 60.8 Body mass index is 26.75 kg/m.  Assessment: 78 year old female s/p hysterectomy with D&C on 7/1. 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 Taken back to the OR 7/2 for ex-lap and partial colectomy and ileostomy.  Pharmacy consulted for TPN 7/7.  Glucose / Insulin : hx DM - CBGs (goal <150): 120-159 (10 units SSI/24 hrs) -linagliptan, rSSI q6h, TPN contains 20 units of insulin   Electrolytes: Na slightly low at 134 (max Na conc in TPN), CorrCa improves to 10 -All other electrolytes WNL Renal: Scr <1 and stable, BUN WNL Hepatic:  AST/ALT  at 44/68; Tbili WNL - TG: 282 (7/14), 220 (7/21) Intake / Output; MIVF: - I/O: -1718 mL -UOP: 1500 mL -Drain: 810 mL -Stool: 150 mL GI Imaging: -7/6 CT A/P 4.6 cm diameter air-fluid level in the anterior pelvis in or adjacent to the sigmoid colon; this may represent an abscess or an air-fluid level within a dilated segment of colon  GI Surgeries / Procedures:  -7/2 ex lap, colectomy w/ colostomy -7/1 hysterectomy  Central access: PICC TPN start date: 7/7  Nutritional Goals: Goal TPN rate is 75 mL/hr (provides 104 g of protein and 1778 kcals per day)  RD Assessment: Estimated Needs Total Energy Estimated Needs: 1750-1900 kcals Total Protein Estimated Needs: 90-110 grams Total Fluid Estimated Needs: >/= 1.8L  Current Nutrition:  Diet: Carb Modified, Fluid consistency: Thin  - Kate Farms 325 mL TID (most charted given) - Calorie count ongoing  TPN reduced to 50 mL/hr per d/w MD on 03/14/24  - 7/21: Based on calorie count and oral intake, Morna Lee (RD) recom to continue TPN at 50 ml/hr for now  Plan:   At 1800:  Continue TPN at 50 mL/hr Will provide 69 g protein, 1185  kcal Electrolytes in TPN: No changes Na 150 (max) mEq/L K 20 mEq/L Ca 0 Mag 7 mEq/L Phos 20 mmol/L Cl:Ac 2:1 MVI PO Continue rSSI q6h and adjust as needed Continue with 20 units of insulin  in TPN Monitor TPN labs on Mon/Thurs, and as needed  Osie Iantha SQUIBB, PharmD 03/20/24 7:01 AM  ________________________________________  Adden: Per [name] via [place] msg, GYN onc team would like to d/c TPN today (7/21) - will reduce TPN rate by half at 4pm to 25 m/hr and d/c the TPN at 6p - Pharmacy will sign off  Iantha Osie, PharmD, BCPS 03/20/2024 11:32 AM

## 2024-03-20 NOTE — Consult Note (Signed)
 WOC consulted for sacral wound. Team is following this patient for [facility] and ostomy  WOC Nurse wound follow up Wound type: midline surgical  Measurement: 11cm x 5cm x 3.5cm  Wound bed: some yellow slough < 25%; bleeds easily; pressure applied  Drainage (amount, consistency, odor) brown however not actively noted to be draining this fluid when NPWT dressing changed  Periwound:intact, using ostomy barrier rings to fill in creasing to aid in seal Dressing procedure/placement/frequency: Removed old NPWT dressing (1) pc Filled wound with   ___1_ piece of black foam  Sealed NPWT dressing at HG Patient received PO pain medication per bedside nurse prior to dressing change Patient verbalized loudly pain, yelling out at times.    WOC nurse will continue to provide NPWT dressing changed due to the complexity of the dressing change.      WOC Nurse ostomy follow up Stoma type/location: LLQ, colostomy Stomal assessment/size: unable to visualize stoma, mucocutaneous separation and necrosis noted at 9 o'clock, stoma is flush, despite this is is active and appears to not be retracted at this time Peristomal assessment: necrosis as above; mucocutaneous separation  Treatment options for stomal/peristomal skin: 2 barrier ring  Output liquid yellow-brown Ostomy pouching: 1pc.soft convex with barrier ring Education provided: NA; patient does not participate or engage in learning care  Enrolled patient in [place] Secure Start Discharge program: Yes   Supplies: barrier rings, [place] # [phone], convex pouches [place] # [phone]   Supplies in the room for pouch change and NPWT dressing.   Chiquita Heckert [facility], CNS, CWON-AP [phone]

## 2024-03-20 NOTE — Progress Notes (Signed)
 19 Days Post-Op Procedure(s) (LRB): LAPAROTOMY, EXPLORATORY (N/A) SIGMOIDOSCOPY COLECTOMY, WITH COLOSTOMY CREATION MOBILIZATION, SPLENIC FLEXURE, WITH PARTIAL COLECTOMY CYSTOSCOPY  Subjective: Patient reports having a terrible weekend. States she could barely eat due to the full feeling she is having. No nausea or emesis. Did not drink any supplemental shakes yesterday. Having left foot pain and continued abdominal discomfort. States over the weekend, her breathing felt heavy for a period of time but this improved. No chest pain.   Objective: Vital signs in last 24 hours: Temp:  [97.9 F (36.6 C)-98.7 F (37.1 C)] 97.9 F (36.6 C) (07/21 0500) Pulse Rate:  [86-95] 86 (07/21 0500) Resp:  [17-20] 18 (07/21 0500) BP: (124-168)/(73-96) 168/91 (07/21 0500) SpO2:  [95 %] 95 % (07/21 0500) Last BM Date : 03/19/24  Intake/Output from previous day: 07/20 0701 - 07/21 0700 In: 741.8 [I.V.:541.8; IV Piggyback:200] Out: 2460 [Urine:1500; Drains:810; Stool:150]  Physical Examination: General: Sleepy, oriented when awake, no acute distress, in bed Respiratory: Normal work of breathing. Lungs clear to auscultation bilaterally without rhonchi. Cardiovascular: Regular sinus rhythm and rate, no murmurs or rubs. Abdomen: soft, mildly distended (less than previous assessment), + BS, tender to palpation diffusely (stable). Incision: wound VAC in place to suction-light brown, cloudy drainage in canister. JP drain with clear serous fluid in tubing, bulb empty. Dry dressing around drain. Ostomy with soft stool and flatus.    Extremities: extremities warm and well perfused, tenderness with movement of left LE Gu: Purewick in place with clear yellow urine  Labs:    Latest Ref Rng & Units 03/17/2024    7:11 AM 03/16/2024    4:38 AM 03/15/2024    4:27 AM  CBC  WBC 4.0 - 10.5 K/uL 9.0  8.6  8.7   Hemoglobin 12.0 - 15.0 g/dL 8.2  7.8  7.9   Hematocrit 36.0 - 46.0 % 25.1  23.5  23.4   Platelets 150 -  400 K/uL 495  384  362       Latest Ref Rng & Units 03/20/2024    5:25 AM 03/18/2024    6:12 AM 03/17/2024    7:11 AM  BMP  Glucose 70 - 99 mg/dL 850  840  813   BUN 8 - 23 mg/dL 22  23  24    Creatinine 0.44 - 1.00 mg/dL 9.22  9.13  9.16   Sodium 135 - 145 mmol/L 134  135  132   Potassium 3.5 - 5.1 mmol/L 4.1  4.1  4.2   Chloride 98 - 111 mmol/L 104  102  100   CO2 22 - 32 mmol/L 23  25  24    Calcium  8.9 - 10.3 mg/dL 8.2  8.5  8.5    Assessment: 78 y.o. s/p Procedure(s): LAPAROTOMY, EXPLORATORY SIGMOIDOSCOPY COLECTOMY, WITH COLOSTOMY CREATION MOBILIZATION, SPLENIC FLEXURE, WITH PARTIAL COLECTOMY CYSTOSCOPY.  No known gyn cancer diagnosis at this time. Endometrial tissue has not been obtained with D&C procedures performed due to thickened endometrium.  Post-Op: Overall working on post-op milestones. Having ostomy output. Encouraged increasing mobility with assist. Excellent UOP. Continues to have decreased appetite. Per meal ticket review for calorie count, small bite of banana and 3 spoonfuls of oatmeal for breakfast yesterday, 0% lunch per pt. New onset left foot pain-hx gout, colchicine  ordered. Dopplers ordered on 7/20-pending completion.    Per Dr. [name], when patient is discharged, JP drain can be removed and will need to have a wick placed in the opening to promote drainage.   Leukocytosis: WBC 8.9  this am. Purulent drainage from around JP drain site noted on 03/09/24- culture with few/rare e coli and enterococcus - susceptibilities returned 03/13/24. Afebrile. With [facility] dressing change 03/13/24, purulent drainage noted coming from the anterior aspect of the incision-culture obtained-moderate e coli and moderate psuedomonas aeruginosa. Given sensitivities, abx changed to meropenem  on 03/16/24 and on zyvox  oral.    Malnutrition: on TPN given prolonged period of NPO, poor PO intake. Albumin  1.7 this am. Continue nutritional supplements. Continue calorie count.   Hyponatremia:  asymptomatic, Na+134 this am.   Acute on chronic anemia: asymptomatic, continue to monitor.   Type 2 diabetes mellitus: Continue insulin  sliding scale.  CBG (last 3)  Recent Labs    03/19/24 1715 03/19/24 2355 03/20/24 0522  GLUCAP 120* 147* 140*      Prophylaxis: SCDs, on lovenox   Physical deconditioning: PT ordered. Up and out of bed as able. Plan for SNF when medically stable for discharge.   Update: Dr. [name] has ordered TPN to be discontinued as of today to see if appetite improves.   LOS: 20 days    Alexandria Durham 03/20/2024, 7:58 AM

## 2024-03-20 NOTE — Progress Notes (Signed)
 VASCULAR LAB    Bilateral lower extremity venous duplex has been performed.  See CV proc for preliminary results.   Tateanna Bach, RVT 03/20/2024, 11:47 AM

## 2024-03-20 NOTE — Progress Notes (Signed)
 Physical Therapy Treatment Patient Details Name: Alexandria Durham MRN: [id number] DOB: 15-Jul-1946 Today's Date: 03/20/2024   History of Present Illness Patient is a 78 year old female who presented on 03/01/2024 following a D and C on 7/1 for thickened endometrium and procedure complicated due to bowel perforation. Pt developed signs and symptoms of sepsis and shock. 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  Pt required post op ventilation and extubated on 7/4.  Pt PMH includes but is not limited to:  recent hospital admissions for diverticulitis, pancreatitis, abdominal pain, D&C  01/06/24 with uterine perforation, cholecystectomy and d&C on 01/17/24, DM II, HTN, COPD, GERD and CKD III.    PT Comments  Pt initially declines PT, but then agreeable so returned to room. Pt significantly limited by pain in abdomen and L foot, able to perform ankle pumps and quad sets but unable to progress due to stabbing and aching pain complaints in abdomen. Pt verbalizes concerns regarding being ind prior to hospitalization and inability to see wounds on abdomen. Offered listening ear and encouragement. Notified nurse of possible chaplain consult and pain medication.   If plan is discharge home, recommend the following: Two people to help with walking and/or transfers;A lot of help with bathing/dressing/bathroom;Assistance with cooking/housework;Assist for transportation   Can travel by private vehicle     No  Equipment Recommendations  Wheelchair cushion (measurements PT);Wheelchair (measurements PT);Hoyer lift;Hospital bed;BSC/3in1    Recommendations for Other Services       Precautions / Restrictions Precautions Precautions: Fall Recall of Precautions/Restrictions: Intact Precaution/Restrictions Comments: Abdominal sx - wound vac, JP drain, ostomy Restrictions Weight Bearing Restrictions Per Provider Order: No Other Position/Activity Restrictions: wound vac, JP drain, ostomy      Mobility  Bed Mobility                    Transfers                        Ambulation/Gait                   Stairs             Wheelchair Mobility     Tilt Bed    Modified Rankin (Stroke Patients Only)       Balance                                            Communication Communication Communication: No apparent difficulties  Cognition Arousal: Alert Behavior During Therapy: WFL for tasks assessed/performed   PT - Cognitive impairments: No apparent impairments                       PT - Cognition Comments: pt reports date is 7/28 but easily reoriented to 7/21 Following commands: Intact      Cueing    Exercises General Exercises - Lower Extremity Ankle Circles/Pumps: AROM, Both, 10 reps, Supine Quad Sets: AROM, Both, 10 reps, Supine    General Comments        Pertinent Vitals/Pain Pain Assessment Pain Assessment: 0-10 Pain Score: 8  Pain Location: all across my abdomen Pain Descriptors / Indicators: Aching Pain Intervention(s): Limited activity within patient's tolerance, Monitored during session    Home Living  Prior Function            PT Goals (current goals can now be found in the care plan section) Acute Rehab PT Goals Patient Stated Goal: not stated PT Goal Formulation: With patient Time For Goal Achievement: 03/21/24 Potential to Achieve Goals: Fair Progress towards PT goals: Not progressing toward goals - comment (limited by pain)    Frequency    Min 2X/week      PT Plan      Co-evaluation              AM-PAC PT 6 Clicks Mobility   Outcome Measure  Help needed turning from your back to your side while in a flat bed without using bedrails?: A Lot Help needed moving from lying on your back to sitting on the side of a flat bed without using bedrails?: A Lot Help needed moving to and from a bed to a chair (including a  wheelchair)?: A Lot Help needed standing up from a chair using your arms (e.g., wheelchair or bedside chair)?: Total Help needed to walk in hospital room?: Total Help needed climbing 3-5 steps with a railing? : Total 6 Click Score: 9    End of Session   Activity Tolerance: Patient limited by pain;Patient limited by fatigue Patient left: in bed;with call bell/phone within reach;with bed alarm set Nurse Communication: Mobility status;Other (comment) (chaplain consult) PT Visit Diagnosis: Difficulty in walking, not elsewhere classified (R26.2);Muscle weakness (generalized) (M62.81);Pain     Time: 1437-1450 PT Time Calculation (min) (ACUTE ONLY): 13 min  Charges:    $Therapeutic Exercise: 8-22 mins PT General Charges $$ ACUTE PT VISIT: 1 Visit                     Tori Analisia Kingsford PT, DPT 03/20/24, 2:53 PM

## 2024-03-20 NOTE — Progress Notes (Signed)
 PT Cancellation Note  Patient Details Name: Alexandria Durham MRN: [id number] DOB: [birth year]   Cancelled Treatment:    Reason Eval/Treat Not Completed: Pain limiting ability to participate. Pt declines, reports abdominal pain from dressing change and L foot gout flare up. Will continue to check back as schedule permits.   Metta Ave PT, DPT 03/20/24, 1:34 PM

## 2024-03-20 NOTE — Progress Notes (Signed)
 Calorie Count Note   Calorie count continued.   Diet: Carb Modified Supplements: Mallie Pinion 1.4 TID   7/18: Breakfast: 0% Lunch: 0% Dinner: 0% -had a KF Supplements: 2 [employer] = 910 kcals, 40g protein   Total intake: 910 kcal (52% of minimum estimated needs)  40g protein (44% of minimum estimated needs)  7/19: Breakfast: 2 bites of oatmeal Lunch: 0% -had a KF Dinner: 0% -had a KF Supplements: 3 [employer] = 1365 kcals, 60g protein   Total intake: 1365 kcal (78% of minimum estimated needs)  60g protein (66% of minimum estimated needs)  7/20: Breakfast: 3 bites of oatmeal, 1 bite of banana ~20 kcals Lunch: 0% Dinner: 0% -had a KF Supplements: 2 [employer] = 910 kcals, 40g protein   Total intake: 930 kcal (53% of minimum estimated needs)  40g protein (44% of minimum estimated needs)  7/21: so far Breakfast: 1/2 bowl of oatmeal =70 kcals, 2 g protein   Nutrition Dx: Severe Malnutrition related to acute illness as evidenced by energy intake < or equal to 50% for > or equal to 5 days, mild muscle depletion, percent weight loss (10% in 1.5 months).    Goal: Patient will meet greater than or equal to 90% of their needs    Intervention:    - Carb Modified diet per MD.             - Assistance with all meals to promote better intake. - Continue [employer] 1.4 PO TID for a plant based nutrition supplement. Each supplement provides 455 kcal and 20 grams protein.   - TPN continues at 28mL/hr per attending service.              - TPN management per pharmacy.              -D/c per GYN oncology  Morna Lee, MS, RD, LDN Inpatient Clinical Dietitian Contact via Secure chat

## 2024-03-21 DIAGNOSIS — R1030 Lower abdominal pain, unspecified: Secondary | ICD-10-CM | POA: Diagnosis not present

## 2024-03-21 DIAGNOSIS — R1084 Generalized abdominal pain: Secondary | ICD-10-CM | POA: Diagnosis not present

## 2024-03-21 DIAGNOSIS — N950 Postmenopausal bleeding: Secondary | ICD-10-CM | POA: Diagnosis not present

## 2024-03-21 DIAGNOSIS — N924 Excessive bleeding in the premenopausal period: Secondary | ICD-10-CM

## 2024-03-21 DIAGNOSIS — B952 Enterococcus as the cause of diseases classified elsewhere: Secondary | ICD-10-CM

## 2024-03-21 DIAGNOSIS — L02211 Cutaneous abscess of abdominal wall: Secondary | ICD-10-CM

## 2024-03-21 DIAGNOSIS — S31109A Unspecified open wound of abdominal wall, unspecified quadrant without penetration into peritoneal cavity, initial encounter: Secondary | ICD-10-CM

## 2024-03-21 DIAGNOSIS — E43 Unspecified severe protein-calorie malnutrition: Secondary | ICD-10-CM | POA: Diagnosis not present

## 2024-03-21 LAB — GLUCOSE, CAPILLARY
Glucose-Capillary: 115 mg/dL — ABNORMAL HIGH (ref 70–99)
Glucose-Capillary: 129 mg/dL — ABNORMAL HIGH (ref 70–99)
Glucose-Capillary: 136 mg/dL — ABNORMAL HIGH (ref 70–99)
Glucose-Capillary: 149 mg/dL — ABNORMAL HIGH (ref 70–99)

## 2024-03-21 MED ORDER — MIRTAZAPINE 15 MG PO TABS
7.5000 mg | ORAL_TABLET | Freq: Every day | ORAL | Status: DC
  Administered 2024-03-21 – 2024-04-03 (×14): 7.5 mg via ORAL
  Filled 2024-03-21 (×14): qty 1

## 2024-03-21 NOTE — Progress Notes (Signed)
 20 Days Post-Op Procedure(s) (LRB): LAPAROTOMY, EXPLORATORY (N/A) SIGMOIDOSCOPY COLECTOMY, WITH COLOSTOMY CREATION MOBILIZATION, SPLENIC FLEXURE, WITH PARTIAL COLECTOMY CYSTOSCOPY  Subjective: Patient reports having a rough evening because she had to have her wound vac dressing adjusted/redressed due to drainage leaking from the lower aspect. She continues to report decreased appetite. She cannot voice any dietary choices that sound appetizing to her. She has a full supplemental shake on her table from last pm. Has not been out of bed due to pain in her left foot. She states this is improving but she is not sure she wants to try to get out of bed today. No nausea or emesis.   Objective: Vital signs in last 24 hours: Temp:  [98.4 F (36.9 C)-98.6 F (37 C)] 98.4 F (36.9 C) (07/22 0506) Pulse Rate:  [87-91] 87 (07/22 0506) Resp:  [16-18] 18 (07/22 0506) BP: (154-162)/(72-81) 154/81 (07/22 0506) SpO2:  [99 %-100 %] 100 % (07/22 0506) Last BM Date : 03/20/24  Intake/Output from previous day: 07/21 0701 - 07/22 0700 In: 902 [P.O.:352; I.V.:442.8; IV Piggyback:107.2] Out: 2550 [Urine:2100; Drains:350; Stool:100]  Physical Examination: General: Alert, oriented, no acute distress, in bed Respiratory: Normal work of breathing. Lungs clear to auscultation bilaterally without rhonchi. Cardiovascular: Regular sinus rhythm and rate, no murmurs or rubs. Abdomen: soft, mildly rounded, + BS, tender to palpation diffusely (stable). Incision: wound VAC in place to suction-serosanguinous drainage in the canister. JP drain with clear serous fluid. Dry dressing around drain. No collection palpated around JP site. Ostomy with soft stool and flatus.    Extremities: extremities warm and well perfused, tenderness with movement of left LE improving Gu: Purewick in place with clear yellow urine  Labs:    Latest Ref Rng & Units 03/20/2024    8:58 AM 03/17/2024    7:11 AM 03/16/2024    4:38 AM  CBC  WBC  4.0 - 10.5 K/uL 8.9  9.0  8.6   Hemoglobin 12.0 - 15.0 g/dL 8.1  8.2  7.8   Hematocrit 36.0 - 46.0 % 25.4  25.1  23.5   Platelets 150 - 400 K/uL 535  495  384       Latest Ref Rng & Units 03/20/2024    5:25 AM 03/18/2024    6:12 AM 03/17/2024    7:11 AM  BMP  Glucose 70 - 99 mg/dL 850  840  813   BUN 8 - 23 mg/dL 22  23  24    Creatinine 0.44 - 1.00 mg/dL 9.22  9.13  9.16   Sodium 135 - 145 mmol/L 134  135  132   Potassium 3.5 - 5.1 mmol/L 4.1  4.1  4.2   Chloride 98 - 111 mmol/L 104  102  100   CO2 22 - 32 mmol/L 23  25  24    Calcium  8.9 - 10.3 mg/dL 8.2  8.5  8.5    Assessment: 78 y.o. s/p Procedure(s): LAPAROTOMY, EXPLORATORY SIGMOIDOSCOPY COLECTOMY, WITH COLOSTOMY CREATION MOBILIZATION, SPLENIC FLEXURE, WITH PARTIAL COLECTOMY CYSTOSCOPY.  No known gyn cancer diagnosis at this time. Endometrial tissue has not been obtained with D&C procedures performed due to thickened endometrium.  Post-Op: TPN discontinued 03/20/24 per Dr. [name]. Continue to monitor patient's intake. Overall working on post-op milestones. Having ostomy output. Encouraged increasing mobility with assist but patient hesitant given left foot pain. Excellent UOP. Continues to have decreased appetite. No oral intake noted on I&O. New onset left foot pain over the weekend-doppler negative, hx gout, colchicine  ordered.  Per Dr. [name], when patient is discharged, JP drain can be removed and will need to have a wick placed in the opening to promote drainage.   Leukocytosis: WBC 8.9 yesterday am. Purulent drainage from around JP drain site noted on 03/09/24- culture with few/rare e coli and enterococcus - susceptibilities returned 03/13/24. Afebrile. With [facility] dressing change 03/13/24, purulent drainage noted coming from the anterior aspect of the incision-culture obtained-moderate e coli and moderate psuedomonas aeruginosa. Given sensitivities, abx changed to meropenem  on 03/16/24 and on zyvox  oral.    Malnutrition: Albumin   1.7 yesterday am. Continue nutritional supplements. TPN discontinued 03/20/24.    Hyponatremia: asymptomatic, Na+134 yesterday am.   Acute on chronic anemia: asymptomatic, continue to monitor.   Type 2 diabetes mellitus: Continue insulin  sliding scale.  CBG (last 3)  Recent Labs    03/20/24 1654 03/20/24 2358 03/21/24 0508  GLUCAP 143* 136* 115*      Prophylaxis: SCDs, on lovenox   Physical deconditioning: PT ordered. Up and out of bed as able. Plan for SNF when medically stable for discharge.    LOS: 21 days    Alexandria Durham 03/21/2024, 9:32 AM

## 2024-03-21 NOTE — Plan of Care (Addendum)
 Pt. Alexandria Durham. VSS. C/o abd pain, prn pain meds administered as ordered. Wound Vac dressing change completed- clean/dry/intact. No leaks noted at this time. Wound Vac functioning per prescribed settings.   Problem: Clinical Measurements: Goal: Ability to maintain clinical measurements within normal limits will improve 03/21/2024 0115 by Truett Art, RN Outcome: Progressing 03/21/2024 0114 by Truett Art, RN Outcome: Progressing Goal: Will remain free from infection 03/21/2024 0115 by Truett Art, RN Outcome: Progressing 03/21/2024 0114 by Truett Art, RN Outcome: Progressing Goal: Diagnostic test results will improve 03/21/2024 0115 by Truett Art, RN Outcome: Progressing 03/21/2024 0114 by Truett Art, RN Outcome: Progressing Goal: Respiratory complications will improve 03/21/2024 0115 by Truett Art, RN Outcome: Progressing 03/21/2024 0114 by Truett Art, RN Outcome: Progressing Goal: Cardiovascular complication will be avoided 03/21/2024 0115 by Truett Art, RN Outcome: Progressing 03/21/2024 0114 by Truett Art, RN Outcome: Progressing   Problem: Activity: Goal: Risk for activity intolerance will decrease 03/21/2024 0115 by Truett Art, RN Outcome: Progressing 03/21/2024 0114 by Truett Art, RN Outcome: Progressing   Problem: Coping: Goal: Level of anxiety will decrease 03/21/2024 0115 by Truett Art, RN Outcome: Progressing 03/21/2024 0114 by Truett Art, RN Outcome: Progressing   Problem: Elimination: Goal: Will not experience complications related to bowel motility 03/21/2024 0115 by Truett Art, RN Outcome: Progressing 03/21/2024 0114 by Truett Art, RN Outcome: Progressing Goal: Will not experience complications related to urinary retention 03/21/2024 0115 by Truett Art, RN Outcome: Progressing 03/21/2024 0114 by Truett Art, RN Outcome: Progressing   Problem: Pain Managment: Goal: General experience of comfort will improve and/or be controlled 03/21/2024  0115 by Truett Art, RN Outcome: Progressing 03/21/2024 0114 by Truett Art, RN Outcome: Progressing   Problem: Safety: Goal: Ability to remain free from injury will improve 03/21/2024 0115 by Truett Art, RN Outcome: Progressing 03/21/2024 0114 by Truett Art, RN Outcome: Progressing   Problem: Skin Integrity: Goal: Risk for impaired skin integrity will decrease 03/21/2024 0115 by Truett Art, RN Outcome: Progressing 03/21/2024 0114 by Truett Art, RN Outcome: Progressing   Problem: Education: Goal: Ability to describe self-care measures that may prevent or decrease complications (Diabetes Survival Skills Education) will improve 03/21/2024 0115 by Truett Art, RN Outcome: Progressing 03/21/2024 0114 by Truett Art, RN Outcome: Progressing Goal: Individualized Educational Video(s) 03/21/2024 0115 by Truett Art, RN Outcome: Progressing 03/21/2024 0114 by Truett Art, RN Outcome: Progressing   Problem: Coping: Goal: Ability to adjust to condition or change in health will improve 03/21/2024 0115 by Truett Art, RN Outcome: Progressing 03/21/2024 0114 by Truett Art, RN Outcome: Progressing   Problem: Fluid Volume: Goal: Ability to maintain a balanced intake and output will improve 03/21/2024 0115 by Truett Art, RN Outcome: Progressing 03/21/2024 0114 by Truett Art, RN Outcome: Progressing   Problem: Health Behavior/Discharge Planning: Goal: Ability to identify and utilize available resources and services will improve 03/21/2024 0115 by Truett Art, RN Outcome: Progressing 03/21/2024 0114 by Truett Art, RN Outcome: Progressing Goal: Ability to manage health-related needs will improve 03/21/2024 0115 by Truett Art, RN Outcome: Progressing 03/21/2024 0114 by Truett Art, RN Outcome: Progressing   Problem: Metabolic: Goal: Ability to maintain appropriate glucose levels will improve 03/21/2024 0115 by Truett Art, RN Outcome: Progressing 03/21/2024 0114 by  Truett Art, RN Outcome: Progressing   Problem: Nutritional: Goal: Maintenance of adequate nutrition will improve 03/21/2024 0115 by Truett Art, RN Outcome: Progressing 03/21/2024 0114 by Truett Art, RN Outcome: Progressing Goal: Progress toward achieving an optimal weight will improve 03/21/2024 0115 by Truett Art, RN Outcome: Progressing  03/21/2024 0114 by Truett Art, RN Outcome: Progressing   Problem: Skin Integrity: Goal: Risk for impaired skin integrity will decrease 03/21/2024 0115 by Truett Art, RN Outcome: Progressing 03/21/2024 0114 by Truett Art, RN Outcome: Progressing   Problem: Tissue Perfusion: Goal: Adequacy of tissue perfusion will improve 03/21/2024 0115 by Truett Art, RN Outcome: Progressing 03/21/2024 0114 by Truett Art, RN Outcome: Progressing   Problem: Education: Goal: Knowledge of the prescribed therapeutic regimen will improve 03/21/2024 0115 by Truett Art, RN Outcome: Progressing 03/21/2024 0114 by Truett Art, RN Outcome: Progressing Goal: Understanding of sexual limitations or changes related to disease process or condition will improve 03/21/2024 0115 by Truett Art, RN Outcome: Progressing 03/21/2024 0114 by Truett Art, RN Outcome: Progressing Goal: Individualized Educational Video(s) 03/21/2024 0115 by Truett Art, RN Outcome: Progressing 03/21/2024 0114 by Truett Art, RN Outcome: Progressing   Problem: Self-Concept: Goal: Communication of feelings regarding changes in body function or appearance will improve 03/21/2024 0115 by Truett Art, RN Outcome: Progressing 03/21/2024 0114 by Truett Art, RN Outcome: Progressing   Problem: Skin Integrity: Goal: Demonstration of wound healing without infection will improve 03/21/2024 0115 by Truett Art, RN Outcome: Progressing 03/21/2024 0114 by Truett Art, RN Outcome: Progressing   Problem: Activity: Goal: Ability to tolerate increased activity will improve 03/21/2024 0115  by Truett Art, RN Outcome: Progressing 03/21/2024 0114 by Truett Art, RN Outcome: Progressing   Problem: Respiratory: Goal: Ability to maintain a clear airway and adequate ventilation will improve 03/21/2024 0115 by Truett Art, RN Outcome: Progressing 03/21/2024 0114 by Truett Art, RN Outcome: Progressing   Problem: Role Relationship: Goal: Method of communication will improve 03/21/2024 0115 by Truett Art, RN Outcome: Progressing 03/21/2024 0114 by Truett Art, RN Outcome: Progressing

## 2024-03-21 NOTE — Consult Note (Signed)
 [facility]    Date of Admission:  02/29/2024     Reason for Consult: wound infection, pelvic abscess    Referring Provider: [name] Mays     Lines:  7/1-c rue picc  Abx: 7/17-c meropenem  7/14-c linezolid   7/15-17 piptazo 7/14-15 cefepime  7/12-14 amox-clav 7/02-11 piptazo        Assessment: 78 y.o. female postmenopausal bleeding s/p multiple endometrial d&c complicated by uterine perforation and sigmoid colon injury s/p most recent procedure 7/02 including ex lap, descending end colostomy, partial colectomy, and also in 01/17/24 laparoscopic cholecystectomy for gallstone pancreatitis/cholecystitis, currently here since 02/29/2024, id consulted for concern of mdro infection of open wound/abd wall abscess   Dr Lyda team is concerned about and abd wall abscess by clinical finding of a fluctuance mass within the abd open wound. There is on 7/14 cx as below  There is on 7/6 abd/pelv ct with I presume 7/10 jp drain cx with nonesbl ecoli and e species and bacteroides  7/14 abd wound swab? Cx ecoli (I amp/sulb; R cipro/bactrim; S piptazo), PsA (R pitpazo, ceftaz, cefepime ; S imipenem, gent, cipro) 7/10 JP drainage Ecoli 2 species with one (I cefazolin ; R cipro/bactrim, amp-sulb; both S piptazo); enterococcus species (R amp; S vanc)   7/22 she said overall pain in abd slowly better but still moderate degree. Wound vac on exam today no purulence; no cellulitis surrounding the vac; the jp drain is clear serous fluid    Would benefit from a repeat ct of abd to ascertain presence or lack of abscess to determine abx duration    Plan: Continue meropenem  and linezolid  Abd pelv ct with contrast to clarify abscesses and help guide abx therapy  Maintain contact isolation Discussed with primary team     ------------------------------------------------ Principal Problem:   Postmenopausal bleeding Active Problems:   Protein-calorie  malnutrition, severe    HPI: Alexandria Durham is a 78 y.o. female postmenopausal bleeding s/p multiple endometrial d&c complicated by uterine perforation and sigmoid colon injury s/p most recent procedure 7/02 including ex lap, descending end colostomy, partial colectomy, and also in 01/17/24 laparoscopic cholecystectomy for gallstone pancreatitis/cholecystitis, currently here since 02/29/2024, id consulted for concern of mdro infection of open wound/abd wall abscess  Chart reviewed  Patient has had several surgical procedures since 12/2023: 01/06/24 laparoscopic diagnostic, with lysis of adhesion (same day d&c and hysteroscopy complicated by uterine perforation) 01/17/24 D&C for thickened endometrium; laparoscopic cholecystectomy with intra-op cholangiogram for chronic cholecystitis and gallstone pancreatitis 02/29/24 hysteroscopy with myosure complicated by sigmoid colon injury; diagnostic laparoscopy; diagnostic rigid sigmoidoscopy --> no full thickness sigmoid colon injury noted 7/02 ex Lap, partial colectomy, descending end colostomy, cystoscopy, endometrial biopsy.  opaque maroon fluid and serosanguineous fluid in abdomen with feculent odor without evidence of stool or succus.  Inflammatory surface of distal small bowel and rectosigmoid colon.  Small bowel run and found to be intact without injury.  Rectosigmoid colon inspected and noted to have defect of mesentery to the left of the sigmoid colon and ultimately identified full-thickness injury to the proximal sigmoid colon.  Uterus with defect in anterior lower uterine segment consistent with site of uterine perforation.  Uterus otherwise globally mildly enlarged.  Pink-tinged urine noted at beginning of procedure and throughout procedure.  Cystoscopy performed with no injury to the bladder but overall injected mucosa.  Bladder diverticula noted. Urine culture collected.  Strong ureteral jets from bilateral ureteral orifices.  Per discussion with dr  Newton Patient has a pelvis drain that is not connected to what the team is concerned about an abd wall abscess I reviewed the abd wound from 7/14 and 7/14 pictures and appear some purulence on wound bed  Patient said pain overall better  No n/v/rash Osteomy no increased output  No fever/leukocytosis  Has wound vac to abd wound and colostomy and a pelvis fluid collection drain  No other complaint    Family History  Problem Relation Age of Onset   Breast cancer Neg Hx    Prostate cancer Neg Hx    Pancreatic cancer Neg Hx    Colon cancer Neg Hx    Endometrial cancer Neg Hx    Ovarian cancer Neg Hx     Social History   Tobacco Use   Smoking status: Former    Types: Cigarettes    Start date: 1967    Passive exposure: Never   Smokeless tobacco: Never  Vaping Use   Vaping status: Never Used  Substance Use Topics   Alcohol use: No   Drug use: No    Allergies  Allergen Reactions   Penicillins Nausea And Vomiting, Palpitations, Dermatitis, Rash and Other (See Comments)    Patient reported an elevated heart rate Tolerated Zosyn  July 2025   Ace Inhibitors Hives and Itching   Tape Other (See Comments)    Skin is sensitive   Aspirin Palpitations   Codeine Palpitations    Review of Systems: ROS All Other ROS was negative, except mentioned above   Past Medical History:  Diagnosis Date   Arthritis    Chronic kidney disease    CKD 3a   COPD (chronic obstructive pulmonary disease) (HCC)    Diabetes mellitus    Family history of adverse reaction to anesthesia    sister woke up during her eye surgery.   GERD (gastroesophageal reflux disease)    Heart murmur    Hypercholesteremia    Hypertension    Hypothyroidism        Scheduled Meds:  buPROPion   150 mg Oral QPM   Chlorhexidine  Gluconate Cloth  6 each Topical Daily   colchicine   0.6 mg Oral BID   enoxaparin  (LOVENOX ) injection  40 mg Subcutaneous Q24H   ezetimibe   10 mg Oral Daily   feeding supplement  (KATE FARMS STANDARD 1.4)  325 mL Oral TID BM   gabapentin   300 mg Oral TID   insulin  aspart  0-20 Units Subcutaneous Q6H   levothyroxine   75 mcg Oral QAC breakfast   lidocaine   1 patch Transdermal Q24H   linagliptin   5 mg Oral Daily   linezolid   600 mg Oral Q12H   melatonin  3 mg Oral QHS   multivitamin with minerals  1 tablet Oral Daily   pantoprazole   40 mg Oral QHS   polyethylene glycol  17 g Oral Daily   sodium chloride  flush  10-40 mL Intracatheter Q12H   sodium chloride  flush  10-40 mL Intracatheter Q12H   Continuous Infusions:  meropenem  (MERREM ) IV 1 g (03/21/24 1200)   PRN Meds:.hydrALAZINE , [DISCONTINUED]  HYDROmorphone  (DILAUDID ) injection **OR** HYDROmorphone  (DILAUDID ) injection, labetalol , methocarbamol , ondansetron  **OR** ondansetron  (ZOFRAN ) IV, mouth rinse, oxyCODONE , simethicone , sodium chloride  flush, sodium chloride  flush   OBJECTIVE: Blood pressure (!) 154/81, pulse 87, temperature 98.4 F (36.9 C), resp. rate 18, height 5' 4 (1.626 m), weight 70.7 kg, SpO2 100%.  Physical Exam General/constitutional: no distress, pleasant, sitting In chair HEENT: Normocephalic, PER, Conj Clear, EOMI, Oropharynx clear Neck supple CV:  rrr no mrg Lungs: clear to auscultation, normal respiratory effort Abd: Soft, tender around incision/vac; osteomy with liquid/brown-yellow stool; left lower quadrant jp drain with clear serous fluid in bulb Ext: no edema Skin: No Rash or sign of cellulitis around vac/incision        Neuro: nonfocal MSK: no peripheral joint swelling/tenderness/warmth; back spines nontender   Central line presence: right upper ext picc site no erythema/purulence    Lab Results Lab Results  Component Value Date   WBC 8.9 03/20/2024   HGB 8.1 (L) 03/20/2024   HCT 25.4 (L) 03/20/2024   MCV 91.7 03/20/2024   PLT 535 (H) 03/20/2024    Lab Results  Component Value Date   CREATININE 0.77 03/20/2024   BUN 22 03/20/2024   NA 134 (L) 03/20/2024   K  4.1 03/20/2024   CL 104 03/20/2024   CO2 23 03/20/2024    Lab Results  Component Value Date   ALT 68 (H) 03/20/2024   AST 44 (H) 03/20/2024   ALKPHOS 100 03/20/2024   BILITOT 0.6 03/20/2024      Microbiology: Recent Results (from the past 240 hours)  Aerobic Culture w Gram Stain (superficial specimen)     Status: None   Collection Time: 03/13/24  2:21 PM   Specimen: Abdomen  Result Value Ref Range Status   Specimen Description   Final    ABDOMEN Performed at [facility], [address] [place], [place] [address]    Special Requests   Final    NONE Performed at [facility], [address] [place], [place] [address]    Gram Stain   Final    RARE WBC PRESENT, PREDOMINANTLY PMN RARE GRAM NEGATIVE RODS Performed at [facility], [address] [place], [place] [address]    Culture   Final    MODERATE ESCHERICHIA COLI Two isolates with different morphologies were identified as the same organism.The most resistant organism was reported. MODERATE PSEUDOMONAS AERUGINOSA    Report Status 03/16/2024 FINAL  Final   Organism ID, Bacteria ESCHERICHIA COLI  Final   Organism ID, Bacteria PSEUDOMONAS AERUGINOSA  Final      Susceptibility   Escherichia coli - MIC*    AMPICILLIN >=32 RESISTANT Resistant     CEFEPIME  <=0.12 SENSITIVE Sensitive     CEFTAZIDIME <=1 SENSITIVE Sensitive     CEFTRIAXONE  <=0.25 SENSITIVE Sensitive     CIPROFLOXACIN >=4 RESISTANT Resistant     GENTAMICIN <=1 SENSITIVE Sensitive     IMIPENEM <=0.25 SENSITIVE Sensitive     TRIMETH/SULFA >=320 RESISTANT Resistant     AMPICILLIN/SULBACTAM 16 INTERMEDIATE Intermediate     PIP/TAZO <=4 SENSITIVE Sensitive ug/mL    * MODERATE ESCHERICHIA COLI   Pseudomonas aeruginosa - MIC*    CEFTAZIDIME >=64 RESISTANT Resistant     CIPROFLOXACIN 0.5 SENSITIVE Sensitive     GENTAMICIN 2 SENSITIVE Sensitive     IMIPENEM 1 SENSITIVE Sensitive     CEFEPIME  >=32 RESISTANT  Resistant     * MODERATE PSEUDOMONAS AERUGINOSA     Serology:    Imaging: If present, new imagings (plain films, ct scans, and mri) have been personally visualized and interpreted; radiology reports have been reviewed. Decision making incorporated into the Impression / Recommendations.  7/10 tte  1. Left ventricular ejection fraction, by estimation, is 65 to 70%. The  left ventricle has normal function. The left ventricle has no regional  wall motion abnormalities. There is mild concentric left ventricular  hypertrophy. Left  ventricular diastolic  parameters are consistent with Grade I diastolic dysfunction (impaired  relaxation).   2. Right ventricular systolic function is normal. The right ventricular  size is normal.   3. The mitral valve is normal in structure. No evidence of mitral valve  regurgitation. No evidence of mitral stenosis.   4. The aortic valve is tricuspid. There is mild calcification of the  aortic valve. Aortic valve regurgitation is not visualized. Aortic valve  sclerosis/calcification is present, without any evidence of aortic  stenosis.   5. The inferior vena cava is normal in size with greater than 50%  respiratory variability, suggesting right atrial pressure of 3 mmHg.    7/6 ct abd pelv chest noncontrast 1. Colonic diverticulosis with evidence of acute diverticulitis in the sigmoid region. 2. Postoperative diverting left lower quadrant descending colostomy. Surgical drain in the pelvis. 3. Small amount of free fluid in the pelvis is likely reactive or postoperative. 4. 4.6 cm diameter air-fluid level in the anterior pelvis in or adjacent to the sigmoid colon. This may represent an abscess or an air-fluid level within a dilated segment of colon. Repeat imaging with rectal contrast material may help to distinguish. 5. Bilateral pleural effusions with basilar atelectasis or infiltration. This may represent compressive atelectasis or pneumonia. 6.  Aortic atherosclerosis. 7. Anterior abdominal wall surgical wound. Diffuse subcutaneous edema.  7/2 endometrial/colon biopsy FINAL MICROSCOPIC DIAGNOSIS:   A. ENDOMETRIAL, BIOPSY:  Abundant blood with minute benign squamous fragment.  No endometrial tissue present.   B. COLON, PORTION, RESECTION:  Transmural defect associated with hemorrhage.  Negative for malignancy.    6/15 ct abd pelv with contrast 1. Persisting changes of diverticulitis in the proximal ascending colon, without evidence of associated bowel perforation or abscess. 2. 3.3 x 1.9 cm mass of hypoenhancing versus nonenhancing tissue interposing between the pancreatic head and the proximal third segment of the duodenum, possibly an inflammatory pseudomass or a contained infrapancreatic hemorrhage. This is moderately decreased in size from 01/15/2024 but slightly increased from 5 days ago. Short interval follow-up study recommended. 3. Trace, possible residual inflammatory edema continues to be seen posterior to this and alongside the uncinate process of the pancreas. 4. Increased thickened folds in the proximal to mid stomach. Correlate clinically for gastritis. 5. Dilated uterine cavity with hypodense material distending the cavity up to 3.4 cm diameter. There is asymmetric thickening of the left lateral endometrial layer up to 8 mm, abnormal for a 78 year old. Consider endometrial sampling to exclude carcinoma. 6. Aortic and branch vessel atherosclerosis. 7. Chronic AVN superior left femoral head. 8. Osteopenia and degenerative change. 9. Scarring and bronchiectasis in the lower lobes.     Constance ONEIDA Passer, MD [facility] [phone] pager    03/21/2024, 3:00 PM

## 2024-03-21 NOTE — Progress Notes (Signed)
 [name]                                                                               Alexandria Durham, is a 78 y.o. female, DOB - [birth year], FMW:[id number] Admit date - 02/29/2024    Outpatient Primary MD for the patient is Alexandria Jacob, NP  LOS - 21  days    Brief summary   78 y.o.  F with PMH significant for thickened endometrium, HTN, Type 2 DM, CKD stage IIIa, recent pancreatitis and acute cholecystitis s/p lap chole and D&C in May of this year who was admitted 7/1 for hysteroscopy and repeat D&C.  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 Pt was acidotic post-procedure, so decision made to leave intubated, PCCM consulted in this setting.  Significant events as below.  Patient was eventually transferred under [facility] to comanage on 03/07/2024 while GYN oncology remains the primary service. Significant events Patient was admitted to the ICU postop intubated on the 7/2 extubated 7/ 4   Assessment & Plan    Assessment and Plan:  Left foot gouty attack On colchicine  0.6 mg BID. She reports left foot pain has improved.  Ambulate as tolerated.    S/p D&C procedure complicated by bowel perforation S/p partial colectomy and ileostomy  Septic shock, shock resolved  Presented for d&c on 7/1 c/b bowel perforation. OR 7/2 after development of septic shock, zosyn  started. Had ex lap with sigmoidoscopy, partial colectomy  and ileostomy placement. CT AP 7/6 showing sigmoid diverticulitis, air-fluid level 4.6cm unable to discern abscess vs segment of colon.  Leukocytosis resolved Culture with E coli and Enterococcus sensitivity pending antibiotics changed to meropenem   Continue Zyvox  patient started on TPN.  Management per primary service. ID on board.   Type 2 DM CBG (last 3)  Recent Labs    03/21/24 0508 03/21/24 1132  03/21/24 1620  GLUCAP 115* 129* 136*   Resume SSI.    Hypertension Bp parameters are well controlled.    Hypothyroidism Resume synthroid  75 mcg daily.    Hyperlipidemia Resume zetia .   Acute on stage 3a CKD Resolved..    Chest pain  Resolved.    Hyponatremia Resolved.    Hyperkalemia Resolved with lokelma .    Normocytic anemia Hemoglobin around 8.1  Recheck cbc in am.    RN Pressure Injury Documentation:    Malnutrition Type:  Nutrition Problem: Severe Malnutrition Etiology: acute illness   Malnutrition Characteristics:  Signs/Symptoms: energy intake < or equal to 50% for > or equal to 5 days, mild muscle depletion, percent weight loss (10% in 1.5 months) Percent weight loss: 10 % (in 1.5 months)   Nutrition Interventions:  Interventions: Refer to RD note for recommendations  Estimated body mass index is 26.75 kg/m as calculated from the following:   Height as of this encounter: 5' 4 (1.626 m).   Weight as of this encounter: 70.7 kg.   Antimicrobials:   Anti-infectives (From admission, onward)    Start     Dose/Rate Route Frequency Ordered Stop  03/16/24 1230  meropenem  (MERREM ) 1 g in sodium chloride  0.9 % 100 mL IVPB        1 g 200 mL/hr over 30 Minutes Intravenous Every 8 hours 03/16/24 1134 03/29/24 2359   03/14/24 1000  linezolid  (ZYVOX ) tablet 600 mg        600 mg Oral Every 12 hours 03/14/24 0903 03/26/24 2359   03/14/24 0930  piperacillin -tazobactam (ZOSYN ) IVPB 3.375 g  Status:  Discontinued        3.375 g 12.5 mL/hr over 240 Minutes Intravenous Every 8 hours 03/14/24 0833 03/16/24 1134   03/13/24 1300  linezolid  (ZYVOX ) IVPB 600 mg  Status:  Discontinued        600 mg 300 mL/hr over 60 Minutes Intravenous Every 12 hours 03/13/24 1200 03/14/24 0903   03/13/24 1300  ceFEPIme  (MAXIPIME ) 2 g in sodium chloride  0.9 % 100 mL IVPB  Status:  Discontinued        2 g 200 mL/hr over 30 Minutes Intravenous Every 8 hours 03/13/24 1200  03/14/24 0833   03/10/24 1200  amoxicillin -clavulanate (AUGMENTIN ) 875-125 MG per tablet 1 tablet  Status:  Discontinued        1 tablet Oral Every 12 hours 03/10/24 1114 03/13/24 1200   03/01/24 1200  piperacillin -tazobactam (ZOSYN ) IVPB 3.375 g  Status:  Discontinued        3.375 g 12.5 mL/hr over 240 Minutes Intravenous Every 8 hours 03/01/24 0946 03/01/24 1116   03/01/24 1130  piperacillin -tazobactam (ZOSYN ) IVPB 3.375 g  Status:  Discontinued        3.375 g 12.5 mL/hr over 240 Minutes Intravenous Every 8 hours 03/01/24 1116 03/10/24 1114        Medications  Scheduled Meds:  buPROPion   150 mg Oral QPM   Chlorhexidine  Gluconate Cloth  6 each Topical Daily   colchicine   0.6 mg Oral BID   enoxaparin  (LOVENOX ) injection  40 mg Subcutaneous Q24H   ezetimibe   10 mg Oral Daily   feeding supplement ([employer] STANDARD 1.4)  325 mL Oral TID BM   gabapentin   300 mg Oral TID   insulin  aspart  0-20 Units Subcutaneous Q6H   levothyroxine   75 mcg Oral QAC breakfast   lidocaine   1 patch Transdermal Q24H   linagliptin   5 mg Oral Daily   linezolid   600 mg Oral Q12H   melatonin  3 mg Oral QHS   multivitamin with minerals  1 tablet Oral Daily   pantoprazole   40 mg Oral QHS   polyethylene glycol  17 g Oral Daily   sodium chloride  flush  10-40 mL Intracatheter Q12H   sodium chloride  flush  10-40 mL Intracatheter Q12H   Continuous Infusions:  meropenem  (MERREM ) IV 1 g (03/21/24 1200)   PRN Meds:.hydrALAZINE , [DISCONTINUED]  HYDROmorphone  (DILAUDID ) injection **OR** HYDROmorphone  (DILAUDID ) injection, labetalol , methocarbamol , ondansetron  **OR** ondansetron  (ZOFRAN ) IV, mouth rinse, oxyCODONE , simethicone , sodium chloride  flush, sodium chloride  flush    Subjective:   [name] was seen and examined today.no chest pain or sob. Abd pain improving.   Objective:   Vitals:   03/19/24 1945 03/20/24 0500 03/20/24 1425 03/21/24 0506  BP: (!) 124/96 (!) 168/91 (!) 162/72 (!) 154/81   Pulse: 87 86 91 87  Resp: 17 18 16 18   Temp: 98.7 F (37.1 C) 97.9 F (36.6 C) 98.6 F (37 C) 98.4 F (36.9 C)  TempSrc: Oral Oral    SpO2: 95% 95% 99% 100%  Weight:      Height:  Intake/Output Summary (Last 24 hours) at 03/21/2024 1627 Last data filed at 03/21/2024 1300 Gross per 24 hour  Intake 247 ml  Output 1450 ml  Net -1203 ml   Filed Weights   03/17/24 0500 03/18/24 0500 03/19/24 0500  Weight: 76.1 kg 76.3 kg 70.7 kg     Exam General exam: Appears calm and comfortable  Respiratory system: Clear to auscultation. Respiratory effort normal. Cardiovascular system: S1 & S2 heard, RRR. No JVD Gastrointestinal system: Abdomen is sot, mildly tender at the incision site. JP drain I place with serous fluid.  Central nervous system: Alert and oriented. No focal neurological deficits. Extremities:  tenderness in the left leg improving.  Skin: No rashes,  Psychiatry:Mood & affect appropriate.     Data Reviewed:  I have personally reviewed following labs and imaging studies   CBC Lab Results  Component Value Date   WBC 8.9 03/20/2024   RBC 2.77 (L) 03/20/2024   HGB 8.1 (L) 03/20/2024   HCT 25.4 (L) 03/20/2024   MCV 91.7 03/20/2024   MCH 29.2 03/20/2024   PLT 535 (H) 03/20/2024   MCHC 31.9 03/20/2024   RDW 16.7 (H) 03/20/2024   LYMPHSABS 1.8 02/15/2024   MONOABS 0.6 02/15/2024   EOSABS 0.1 02/15/2024   BASOSABS 0.0 02/15/2024     Last metabolic panel Lab Results  Component Value Date   NA 134 (L) 03/20/2024   K 4.1 03/20/2024   CL 104 03/20/2024   CO2 23 03/20/2024   BUN 22 03/20/2024   CREATININE 0.77 03/20/2024   GLUCOSE 149 (H) 03/20/2024   GFRNONAA >60 03/20/2024   GFRAA 51 (L) 03/08/2016   CALCIUM  8.2 (L) 03/20/2024   PHOS 3.4 03/20/2024   PROT 6.0 (L) 03/20/2024   ALBUMIN  1.7 (L) 03/20/2024   BILITOT 0.6 03/20/2024   ALKPHOS 100 03/20/2024   AST 44 (H) 03/20/2024   ALT 68 (H) 03/20/2024   ANIONGAP 7 03/20/2024    CBG (last 3)   Recent Labs    03/21/24 0508 03/21/24 1132 03/21/24 1620  GLUCAP 115* 129* 136*      Coagulation Profile: No results for input(s): INR, PROTIME in the last 168 hours.   Radiology Studies: VAS US  LOWER EXTREMITY VENOUS (DVT) Result Date: 03/20/2024  Lower Venous DVT Study Patient Name:  SARYAH LOPER  Date of Exam:   03/20/2024 Medical Rec #: [id number]       Accession #:    [id number] Date of Birth: [birth year]       Patient Gender: F Patient Age:   29 years Exam Location:  [facility] Procedure:      VAS US  LOWER EXTREMITY VENOUS (DVT) Referring Phys: OLAM MILL --------------------------------------------------------------------------------  Indications: Pain, and Left lower extremity gout.  Risk Factors: Immobility Status post multiple laparoscopic surgeries this admission including Hysteroscopy with myosure, laparoscopy of the pelvis, abdomen, and sigmoidoscopy of the rectum, colectomy, colostomy creation 02/29/2024 and 03/01/2024. Limitations: Poor ultrasound/tissue interface and edema. Comparison Study: No prior study on file Performing Technologist: Alberta Lis RVS  Examination Guidelines: A complete evaluation includes B-mode imaging, spectral Doppler, color Doppler, and power Doppler as needed of all accessible portions of each vessel. Bilateral testing is considered an integral part of a complete examination. Limited examinations for reoccurring indications may be performed as noted. The reflux portion of the exam is performed with the patient in reverse Trendelenburg.  +---------+---------------+---------+-----------+----------+-------------------+ RIGHT    CompressibilityPhasicitySpontaneityPropertiesThrombus Aging      +---------+---------------+---------+-----------+----------+-------------------+ CFV      Full  Yes      Yes                                      +---------+---------------+---------+-----------+----------+-------------------+ SFJ       Full                                                             +---------+---------------+---------+-----------+----------+-------------------+ FV Prox  Full           Yes      Yes                                      +---------+---------------+---------+-----------+----------+-------------------+ FV Mid   Full                                                             +---------+---------------+---------+-----------+----------+-------------------+ FV DistalFull                                                             +---------+---------------+---------+-----------+----------+-------------------+ PFV      Full           Yes      Yes                                      +---------+---------------+---------+-----------+----------+-------------------+ POP      Full           Yes      Yes                                      +---------+---------------+---------+-----------+----------+-------------------+ PTV      Full                                         Not well visualized +---------+---------------+---------+-----------+----------+-------------------+ PERO                                                  Not well visualized +---------+---------------+---------+-----------+----------+-------------------+   +---------+---------------+---------+-----------+----------+-------------------+ LEFT     CompressibilityPhasicitySpontaneityPropertiesThrombus Aging      +---------+---------------+---------+-----------+----------+-------------------+ CFV      Full           Yes      Yes                  Rouleaux flow       +---------+---------------+---------+-----------+----------+-------------------+ SFJ  Full           Yes      Yes                  Rouleaux flow       +---------+---------------+---------+-----------+----------+-------------------+ FV Prox  Full           Yes      Yes                                       +---------+---------------+---------+-----------+----------+-------------------+ FV Mid   Full           Yes      Yes                                      +---------+---------------+---------+-----------+----------+-------------------+ FV DistalFull                                                             +---------+---------------+---------+-----------+----------+-------------------+ PFV      Full           Yes      Yes                                      +---------+---------------+---------+-----------+----------+-------------------+ POP      Full           Yes      Yes                                      +---------+---------------+---------+-----------+----------+-------------------+ PTV      Full                                         Not well visualized +---------+---------------+---------+-----------+----------+-------------------+ PERO                                                  Not well visualized +---------+---------------+---------+-----------+----------+-------------------+     Summary: RIGHT: - There is no evidence of deep vein thrombosis in the lower extremity.  - No cystic structure found in the popliteal fossa. - Ultrasound characteristics of enlarged lymph nodes are noted in the groin.  LEFT: - There is no evidence of deep vein thrombosis in the lower extremity.  - No cystic structure found in the popliteal fossa. - Ultrasound characteristics of enlarged lymph nodes noted in the groin.  *See table(s) above for measurements and observations. Electronically signed by Lonni Gaskins MD on 03/20/2024 at 3:13:13 PM.    Final        Elgie Butter M.D. [name] 03/21/2024, 4:27 PM  Available via Epic secure chat 7am-7pm After 7 pm, please refer to night coverage provider listed on amion.

## 2024-03-21 NOTE — Progress Notes (Signed)
 Occupational Therapy Treatment Patient Details Name: Alexandria Durham MRN: [id number] DOB: Sep 29, 1945 Today's Date: 03/21/2024   History of present illness Patient is a 78 year old female who presented on 03/01/2024 following a D and C on 7/1 for thickened endometrium and procedure complicated due to bowel perforation. Pt developed signs and symptoms of sepsis and shock. 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  Pt required post op ventilation and extubated on 7/4.  Pt PMH includes but is not limited to:  recent hospital admissions for diverticulitis, pancreatitis, abdominal pain, D&C  01/06/24 with uterine perforation, cholecystectomy and d&C on 01/17/24, DM II, HTN, COPD, GERD and CKD III.   OT comments  Pt was agreeable to therapy participation, though reporting significant abdominal pain. She required increased time and effort for progressive activity, given pain and associated guarding of movements. She requird +2 assist for supine to sit, sit to stand using a RW, and to stand-pivot to the bedside chair using a RW. She required increased assist to transfer to the chair this date, as compared to prior OT session. Once seated in the chair, she required set-up/supervision for upper body grooming. OT goals and plan of care updated and extended this date.  Patient will benefit from continued inpatient follow up therapy, <3 hours/day       If plan is discharge home, recommend the following:  Assistance with cooking/housework;Assist for transportation;Help with stairs or ramp for entrance;A lot of help with bathing/dressing/bathroom;A lot of help with walking and/or transfers   Equipment Recommendations  Other (comment) (defer to next level of care)    Recommendations for Other Services      Precautions / Restrictions Precautions Precautions: Fall Restrictions Weight Bearing Restrictions Per Provider Order: No Other Position/Activity Restrictions: wound vac, JP drain, ostomy        Mobility Bed Mobility Overal bed mobility: Needs Assistance Bed Mobility: Rolling   Sidelying to sit: Max assist, +2 for safety/equipment, HOB elevated, Used rails       General bed mobility comments: Pt was instructed on log roll technique. She required increased time and effort, as well as increased cues for general sequencing/transfer technique    Transfers Overall transfer level: Needs assistance Equipment used: Rolling walker (2 wheels) Transfers: Sit to/from Stand, Bed to chair/wheelchair/BSC Sit to Stand: Mod assist, From elevated surface, +2 physical assistance, +2 safety/equipment Stand pivot transfers: Mod assist, +2 physical assistance, +2 safety/equipment, From elevated surface         General transfer comment: She required cues for trunk extension in standing, as well as for walker advancement and step sequencing when transferring to the chair     Balance     Sitting balance-Leahy Scale: Fair       Standing balance-Leahy Scale: Poor              ADL either performed or assessed with clinical judgement   ADL Overall ADL's : Needs assistance/impaired     Grooming: Sitting;Set up;Supervision/safety Grooming Details (indicate cue type and reason): She performed face washing, teeth brushing and applying lip moisturizer seated in the bedside chair.     Lower Body Bathing: Total assistance;Bed level Lower Body Bathing Details (indicate cue type and reason): to donn socks seated EOB                             Communication Communication Communication: No apparent difficulties   Cognition Arousal: Alert Behavior During Therapy:  Anxious           Attention impairment (select first level of impairment): Divided attention, Sustained attention Executive functioning impairment (select all impairments): Problem solving                     Following commands impaired: Follows one step commands with increased time (and  occasional repetition of prompts)      Cueing   Cueing Techniques: Verbal cues, Gestural cues, Tactile cues             Pertinent Vitals/ Pain       Pain Assessment Pain Assessment: Faces Pain Score: 8  Pain Location: abdomen Pain Intervention(s): Limited activity within patient's tolerance, Monitored during session, Repositioned   Frequency  Min 2X/week        Progress Toward Goals  OT Goals(current goals can now be found in the care plan section)     Acute Rehab OT Goals Patient Stated Goal: decreased pain and to get better OT Goal Formulation: With patient Time For Goal Achievement: 04/05/24 Potential to Achieve Goals: Good  Plan         AM-PAC OT 6 Clicks Daily Activity     Outcome Measure   Help from another person eating meals?: None Help from another person taking care of personal grooming?: A Little Help from another person toileting, which includes using toliet, bedpan, or urinal?: A Lot Help from another person bathing (including washing, rinsing, drying)?: A Lot Help from another person to put on and taking off regular upper body clothing?: A Lot Help from another person to put on and taking off regular lower body clothing?: Total 6 Click Score: 14    End of Session Equipment Utilized During Treatment: Gait belt;Rolling walker (2 wheels)  OT Visit Diagnosis: Unsteadiness on feet (R26.81);Muscle weakness (generalized) (M62.81);Pain;Other abnormalities of gait and mobility (R26.89) Pain - part of body:  (abdomen)   Activity Tolerance Patient limited by pain   Patient Left in chair;with call bell/phone within reach;with chair alarm set   Nurse Communication Mobility status        Time: 8461-8390 OT Time Calculation (min): 31 min  Charges: OT General Charges $OT Visit: 1 Visit OT Treatments $Self Care/Home Management : 8-22 mins $Therapeutic Activity: 8-22 mins     Delanna LITTIE Molt, OTR/L 03/21/2024, 5:36 PM

## 2024-03-21 NOTE — Progress Notes (Signed)
 Nutrition Follow-up  DOCUMENTATION CODES:   Severe malnutrition in context of acute illness/injury  INTERVENTION:   -If not able to improve intakes, need to consider Cortrak tube placement and tube feeds.  -Calorie Counts continue  -[employer] 1.4 PO TID, each provides 455 kcals and 20g protein  - Carb Modified diet per MD.             - Assistance with all meals to promote better intake.   NUTRITION DIAGNOSIS:   Severe Malnutrition related to acute illness as evidenced by energy intake < or equal to 50% for > or equal to 5 days, mild muscle depletion, percent weight loss (10% in 1.5 months).  Ongoing.  GOAL:   Patient will meet greater than or equal to 90% of their needs  Not meeting.  MONITOR:   Vent status, Labs, Weight trends   ASSESSMENT:   78 y.o.  F with PMH significant for thickened endometrium, HTN, Type 2 DM, CKD stage IIIa, recent pancreatitis and acute cholecystitis s/p lap chole and D&C in May of this year who was admitted 7/1 for hysteroscopy and repeat D&C.  7/1 hysteroscopy and repeat D&C. 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 Drinking cola (90 kcals) and sipping on [employer] supplement.  Pt wincing in pain when taking bites. Reports abdominal pain, states worse when full from eating. Also reports some smell aversions to food and lack of taste. Reports MD was considering appetite stimulant.  Will continue [employer].  Onc wants to continue calorie counts. If not able to improve intakes, need to consider Cortrak tube placement and tube feeds.  Calorie Count 7/21: Breakfast: 1/2 bowl of oatmeal =70  kcals, 2 g protein  Lunch: 0%  Dinner: 0%  Supplements: none  Admission weight: 141 lbs Current weight: 155 lbs  Medications: Multivitamin with minerals daily, Miralax   Labs reviewed: CBGs: 115-159 Low Na TG 220  Diet Order:   Diet Order             Diet Carb Modified Fluid consistency: Thin  Diet effective now                   EDUCATION NEEDS:   No education needs have been identified at this time  Skin:  Skin Assessment: Skin Integrity Issues: Skin Integrity Issues:: Incisions Incisions: Abdomen  Last BM:  7/21 -colostomy  Height:   Ht Readings from Last 1 Encounters:  03/06/24 5' 4 (1.626 m)    Weight:   Wt Readings from Last 1 Encounters:  03/19/24 70.7 kg    Ideal Body Weight:  54.55 kg  BMI:  Body mass index is 26.75 kg/m.  Estimated Nutritional Needs:   Kcal:  1750-1900 kcals  Protein:  90-110 grams  Fluid:  >/= 1.8L  Morna Lee, MS, RD, LDN Inpatient Clinical Dietitian Contact via Secure chat

## 2024-03-22 ENCOUNTER — Inpatient Hospital Stay (HOSPITAL_COMMUNITY)

## 2024-03-22 DIAGNOSIS — E43 Unspecified severe protein-calorie malnutrition: Secondary | ICD-10-CM | POA: Diagnosis not present

## 2024-03-22 DIAGNOSIS — N950 Postmenopausal bleeding: Secondary | ICD-10-CM | POA: Diagnosis not present

## 2024-03-22 DIAGNOSIS — R1084 Generalized abdominal pain: Secondary | ICD-10-CM | POA: Diagnosis not present

## 2024-03-22 DIAGNOSIS — R1030 Lower abdominal pain, unspecified: Secondary | ICD-10-CM | POA: Diagnosis not present

## 2024-03-22 LAB — CBC WITH DIFFERENTIAL/PLATELET
Abs Immature Granulocytes: 0.16 K/uL — ABNORMAL HIGH (ref 0.00–0.07)
Basophils Absolute: 0.1 K/uL (ref 0.0–0.1)
Basophils Relative: 1 %
Eosinophils Absolute: 0.1 K/uL (ref 0.0–0.5)
Eosinophils Relative: 1 %
HCT: 27.5 % — ABNORMAL LOW (ref 36.0–46.0)
Hemoglobin: 8.7 g/dL — ABNORMAL LOW (ref 12.0–15.0)
Immature Granulocytes: 2 %
Lymphocytes Relative: 19 %
Lymphs Abs: 1.9 K/uL (ref 0.7–4.0)
MCH: 29 pg (ref 26.0–34.0)
MCHC: 31.6 g/dL (ref 30.0–36.0)
MCV: 91.7 fL (ref 80.0–100.0)
Monocytes Absolute: 0.8 K/uL (ref 0.1–1.0)
Monocytes Relative: 8 %
Neutro Abs: 7 K/uL (ref 1.7–7.7)
Neutrophils Relative %: 69 %
Platelets: 584 K/uL — ABNORMAL HIGH (ref 150–400)
RBC: 3 MIL/uL — ABNORMAL LOW (ref 3.87–5.11)
RDW: 16.3 % — ABNORMAL HIGH (ref 11.5–15.5)
WBC: 10.1 K/uL (ref 4.0–10.5)
nRBC: 0.2 % (ref 0.0–0.2)

## 2024-03-22 LAB — BASIC METABOLIC PANEL WITH GFR
Anion gap: 7 (ref 5–15)
BUN: 18 mg/dL (ref 8–23)
CO2: 25 mmol/L (ref 22–32)
Calcium: 9.1 mg/dL (ref 8.9–10.3)
Chloride: 99 mmol/L (ref 98–111)
Creatinine, Ser: 0.92 mg/dL (ref 0.44–1.00)
GFR, Estimated: >60 mL/min (ref >60)
Glucose, Bld: 157 mg/dL — ABNORMAL HIGH (ref 70–99)
Potassium: 4.5 mmol/L (ref 3.5–5.1)
Sodium: 131 mmol/L — ABNORMAL LOW (ref 135–145)

## 2024-03-22 LAB — GLUCOSE, CAPILLARY
Glucose-Capillary: 108 mg/dL — ABNORMAL HIGH (ref 70–99)
Glucose-Capillary: 130 mg/dL — ABNORMAL HIGH (ref 70–99)
Glucose-Capillary: 139 mg/dL — ABNORMAL HIGH (ref 70–99)

## 2024-03-22 MED ORDER — IOHEXOL 9 MG/ML PO SOLN
1000.0000 mL | ORAL | Status: AC
  Administered 2024-03-22: 600 mL via ORAL

## 2024-03-22 MED ORDER — ENOXAPARIN SODIUM 40 MG/0.4ML IJ SOSY
40.0000 mg | PREFILLED_SYRINGE | INTRAMUSCULAR | Status: DC
  Administered 2024-03-24 – 2024-04-04 (×12): 40 mg via SUBCUTANEOUS
  Filled 2024-03-22 (×12): qty 0.4

## 2024-03-22 MED ORDER — IOHEXOL 300 MG/ML  SOLN
100.0000 mL | Freq: Once | INTRAMUSCULAR | Status: AC | PRN
  Administered 2024-03-22: 100 mL via INTRAVENOUS

## 2024-03-22 NOTE — Plan of Care (Signed)
  Problem: Clinical Measurements: Goal: Ability to maintain clinical measurements within normal limits will improve Outcome: Progressing Goal: Will remain free from infection Outcome: Progressing Goal: Diagnostic test results will improve Outcome: Progressing Goal: Respiratory complications will improve Outcome: Progressing Goal: Cardiovascular complication will be avoided Outcome: Progressing   Problem: Activity: Goal: Risk for activity intolerance will decrease Outcome: Progressing   Problem: Coping: Goal: Level of anxiety will decrease Outcome: Progressing   Problem: Elimination: Goal: Will not experience complications related to bowel motility Outcome: Progressing Goal: Will not experience complications related to urinary retention Outcome: Progressing   Problem: Pain Managment: Goal: General experience of comfort will improve and/or be controlled Outcome: Progressing   Problem: Safety: Goal: Ability to remain free from injury will improve Outcome: Progressing   Problem: Skin Integrity: Goal: Risk for impaired skin integrity will decrease Outcome: Progressing   Problem: Education: Goal: Ability to describe self-care measures that may prevent or decrease complications (Diabetes Survival Skills Education) will improve Outcome: Progressing Goal: Individualized Educational Video(s) Outcome: Progressing   Problem: Coping: Goal: Ability to adjust to condition or change in health will improve Outcome: Progressing   Problem: Fluid Volume: Goal: Ability to maintain a balanced intake and output will improve Outcome: Progressing   Problem: Health Behavior/Discharge Planning: Goal: Ability to identify and utilize available resources and services will improve Outcome: Progressing Goal: Ability to manage health-related needs will improve Outcome: Progressing   Problem: Metabolic: Goal: Ability to maintain appropriate glucose levels will improve Outcome: Progressing    Problem: Nutritional: Goal: Maintenance of adequate nutrition will improve Outcome: Progressing Goal: Progress toward achieving an optimal weight will improve Outcome: Progressing   Problem: Skin Integrity: Goal: Risk for impaired skin integrity will decrease Outcome: Progressing   Problem: Tissue Perfusion: Goal: Adequacy of tissue perfusion will improve Outcome: Progressing   Problem: Education: Goal: Knowledge of the prescribed therapeutic regimen will improve Outcome: Progressing Goal: Understanding of sexual limitations or changes related to disease process or condition will improve Outcome: Progressing Goal: Individualized Educational Video(s) Outcome: Progressing   Problem: Self-Concept: Goal: Communication of feelings regarding changes in body function or appearance will improve Outcome: Progressing   Problem: Skin Integrity: Goal: Demonstration of wound healing without infection will improve Outcome: Progressing   Problem: Activity: Goal: Ability to tolerate increased activity will improve Outcome: Progressing   Problem: Respiratory: Goal: Ability to maintain a clear airway and adequate ventilation will improve Outcome: Progressing   Problem: Role Relationship: Goal: Method of communication will improve Outcome: Progressing

## 2024-03-22 NOTE — Progress Notes (Signed)
 [name]                                                                               Alexandria Durham, is a 78 y.o. female, DOB - 03-12-1946, FMW:[id number] Admit date - 02/29/2024    Outpatient Primary MD for the patient is Alexandria Jacob, NP  LOS - 22  days    Brief summary   78 y.o.  F with PMH significant for thickened endometrium, HTN, Type 2 DM, CKD stage IIIa, recent pancreatitis and acute cholecystitis s/p lap chole and D&C in May of this year who was admitted 7/1 for hysteroscopy and repeat D&C.  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 Pt was acidotic post-procedure, so decision made to leave intubated, PCCM consulted in this setting.  Significant events as below.  Patient was eventually transferred under [facility] to [facility]age on 03/07/2024 while GYN oncology remains the primary service. Significant events Patient was admitted to the ICU postop intubated on the 7/2 extubated 7/ 4.    Assessment & Plan    Assessment and Plan:  Left foot gouty attack On colchicine  0.6 mg BID. She reports left foot pain has improved.  Ambulate as tolerated.    S/p D&C procedure complicated by bowel perforation S/p partial colectomy and ileostomy  Septic shock,  shock resolved  Presented for d&c on 7/1 c/b bowel perforation. OR 7/2 after development of septic shock, zosyn  started.  Had ex lap with sigmoidoscopy, partial colectomy  and ileostomy placement.  CT AP 7/6 showing sigmoid diverticulitis, air-fluid level 4.6cm unable to discern abscess vs segment of colon.  Repeat CT abd and pelvis on 7/23 shows rim enhancing fluid and gas collection along the left pericolic gutter which measures 8.0 x 2.2 x 8.3 cm. Suspicious for abscess.  There is  also new loculated fluid along the uterus, endometrium measuring 4.4 by 2.9 by 2.4 cm. Evaluate for  infection and possible fistula CT abd also shows thickened folds in the stomach, unclear etiology, similar presentation in June 5 th CT.  Culture from JP drain from 7/10  with E coli and Enterococcus , bacteroides. Patient is on meropenem  and zyvox .   She will Benefit from IR consult to see if the collections need to be drained.  ID on board and appreciate recommendations.   Pain control with dilaudid  1 mg every 2 hours prn, oxycodone  10 to 15 mg every 4 hours prn for moderate pain, and neurontin  300 mg TID.    Type 2 DM CBG (last 3)  Recent Labs    03/21/24 2350 03/22/24 0549 03/22/24 1153  GLUCAP 149* 108* 130*   Resume SSI.    Hypertension Bp parameters are optimal.    Hypothyroidism Resume synthroid  75 mcg daily.    Hyperlipidemia Resume zetia .   Acute on stage 3a CKD Resolved..    Chest pain  Resolved.    Hyponatremia Sodium is 131, asymptomatic.    Hyperkalemia Resolved with lokelma .    Anemia of blood loss from surgery.  Hemoglobin around 8.7.  Thrombocytosis  Possibly from the infection.    RN Pressure Injury Documentation:    Malnutrition Type:  Nutrition Problem: Severe Malnutrition Etiology: acute illness   Malnutrition Characteristics:  Signs/Symptoms: energy intake < or equal to 50% for > or equal to 5 days, mild muscle depletion, percent weight loss (10% in 1.5 months) Percent weight loss: 10 % (in 1.5 months)   Nutrition Interventions:  Interventions: Refer to RD note for recommendations  Estimated body mass index is 26.75 kg/m as calculated from the following:   Height as of this encounter: 5' 4 (1.626 m).   Weight as of this encounter: 70.7 kg.   Antimicrobials:   Anti-infectives (From admission, onward)    Start     Dose/Rate Route Frequency Ordered Stop   03/16/24 1230  meropenem  (MERREM ) 1 g in sodium chloride  0.9 % 100 mL IVPB        1 g 200 mL/hr over 30 Minutes Intravenous Every 8 hours 03/16/24 1134  03/29/24 2359   03/14/24 1000  linezolid  (ZYVOX ) tablet 600 mg        600 mg Oral Every 12 hours 03/14/24 0903 03/26/24 2359   03/14/24 0930  piperacillin -tazobactam (ZOSYN ) IVPB 3.375 g  Status:  Discontinued        3.375 g 12.5 mL/hr over 240 Minutes Intravenous Every 8 hours 03/14/24 0833 03/16/24 1134   03/13/24 1300  linezolid  (ZYVOX ) IVPB 600 mg  Status:  Discontinued        600 mg 300 mL/hr over 60 Minutes Intravenous Every 12 hours 03/13/24 1200 03/14/24 0903   03/13/24 1300  ceFEPIme  (MAXIPIME ) 2 g in sodium chloride  0.9 % 100 mL IVPB  Status:  Discontinued        2 g 200 mL/hr over 30 Minutes Intravenous Every 8 hours 03/13/24 1200 03/14/24 0833   03/10/24 1200  amoxicillin -clavulanate (AUGMENTIN ) 875-125 MG per tablet 1 tablet  Status:  Discontinued        1 tablet Oral Every 12 hours 03/10/24 1114 03/13/24 1200   03/01/24 1200  piperacillin -tazobactam (ZOSYN ) IVPB 3.375 g  Status:  Discontinued        3.375 g 12.5 mL/hr over 240 Minutes Intravenous Every 8 hours 03/01/24 0946 03/01/24 1116   03/01/24 1130  piperacillin -tazobactam (ZOSYN ) IVPB 3.375 g  Status:  Discontinued        3.375 g 12.5 mL/hr over 240 Minutes Intravenous Every 8 hours 03/01/24 1116 03/10/24 1114        Medications  Scheduled Meds:  buPROPion   150 mg Oral QPM   Chlorhexidine  Gluconate Cloth  6 each Topical Daily   enoxaparin  (LOVENOX ) injection  40 mg Subcutaneous Q24H   ezetimibe   10 mg Oral Daily   feeding supplement ([employer] STANDARD 1.4)  325 mL Oral TID BM   gabapentin   300 mg Oral TID   insulin  aspart  0-20 Units Subcutaneous Q6H   levothyroxine   75 mcg Oral QAC breakfast   lidocaine   1 patch Transdermal Q24H   linagliptin   5 mg Oral Daily   linezolid   600 mg Oral Q12H   melatonin  3 mg Oral QHS   mirtazapine   7.5 mg Oral QHS   multivitamin with minerals  1 tablet Oral Daily   pantoprazole   40 mg Oral QHS   polyethylene glycol  17 g Oral Daily   sodium chloride  flush  10-40 mL  Intracatheter Q12H   sodium chloride  flush  10-40 mL Intracatheter Q12H   Continuous Infusions:  meropenem  (MERREM )  IV 1 g (03/22/24 1351)   PRN Meds:.hydrALAZINE , [DISCONTINUED]  HYDROmorphone  (DILAUDID ) injection **OR** HYDROmorphone  (DILAUDID ) injection, labetalol , methocarbamol , ondansetron  **OR** ondansetron  (ZOFRAN ) IV, mouth rinse, oxyCODONE , simethicone , sodium chloride  flush, sodium chloride  flush    Subjective:   Alexandria Durham was seen and examined today. Pain controlled.   Objective:   Vitals:   03/21/24 2033 03/22/24 0438 03/22/24 0831 03/22/24 1444  BP: 135/74 (!) 157/76 138/71 (!) 149/72  Pulse: 91 87 91 94  Resp: 18 18 16 18   Temp: 99.1 F (37.3 C) 98.4 F (36.9 C) 98 F (36.7 C) 98.9 F (37.2 C)  TempSrc: Oral Oral Oral Oral  SpO2: 100% 97% 100% 96%  Weight:      Height:        Intake/Output Summary (Last 24 hours) at 03/22/2024 1509 Last data filed at 03/22/2024 0800 Gross per 24 hour  Intake 60 ml  Output 1605 ml  Net -1545 ml   Filed Weights   03/17/24 0500 03/18/24 0500 03/19/24 0500  Weight: 76.1 kg 76.3 kg 70.7 kg     Exam General exam: Appears calm and comfortable  Respiratory system: Clear to auscultation. Respiratory effort normal. Cardiovascular system: S1 & S2 heard, RRR. No JVD,  Gastrointestinal system: Abdomen is soft, bs+, abdominal tenderness, Jp drain in place.  Central nervous system: Alert and oriented. No focal neurological deficits. Extremities: Symmetric 5 x 5 power. Skin: No rashes,  Psychiatry: Mood & affect appropriate.      Data Reviewed:  I have personally reviewed following labs and imaging studies   CBC Lab Results  Component Value Date   WBC 10.1 03/22/2024   RBC 3.00 (L) 03/22/2024   HGB 8.7 (L) 03/22/2024   HCT 27.5 (L) 03/22/2024   MCV 91.7 03/22/2024   MCH 29.0 03/22/2024   PLT 584 (H) 03/22/2024   MCHC 31.6 03/22/2024   RDW 16.3 (H) 03/22/2024   LYMPHSABS 1.9 03/22/2024   MONOABS 0.8 03/22/2024    EOSABS 0.1 03/22/2024   BASOSABS 0.1 03/22/2024     Last metabolic panel Lab Results  Component Value Date   NA 131 (L) 03/22/2024   K 4.5 03/22/2024   CL 99 03/22/2024   CO2 25 03/22/2024   BUN 18 03/22/2024   CREATININE 0.92 03/22/2024   GLUCOSE 157 (H) 03/22/2024   GFRNONAA >60 03/22/2024   GFRAA 51 (L) 03/08/2016   CALCIUM  9.1 03/22/2024   PHOS 3.4 03/20/2024   PROT 6.0 (L) 03/20/2024   ALBUMIN  1.7 (L) 03/20/2024   BILITOT 0.6 03/20/2024   ALKPHOS 100 03/20/2024   AST 44 (H) 03/20/2024   ALT 68 (H) 03/20/2024   ANIONGAP 7 03/22/2024    CBG (last 3)  Recent Labs    03/21/24 2350 03/22/24 0549 03/22/24 1153  GLUCAP 149* 108* 130*      Coagulation Profile: No results for input(s): INR, PROTIME in the last 168 hours.   Radiology Studies: CT ABDOMEN PELVIS W CONTRAST Result Date: 03/22/2024 CLINICAL DATA:  Abdominal wall abscess. Open wound with purulent drainage. Wound VAC in place EXAM: CT ABDOMEN AND PELVIS WITH CONTRAST TECHNIQUE: Multidetector CT imaging of the abdomen and pelvis was performed using the standard protocol following bolus administration of intravenous contrast. RADIATION DOSE REDUCTION: This exam was performed according to the departmental dose-optimization program which includes automated exposure control, adjustment of the mA and/or kV according to patient size and/or use of iterative reconstruction technique. CONTRAST:  OMNIPAQUE  IOHEXOL  300 MG/ML  SOLN COMPARISON:  Noncontrast CT chest  abdomen pelvis 03/05/2024. FINDINGS: Lower chest: Trace bilateral pleural fluid, left greater than right. Adjacent opacities are again seen. The opacities in the pleural effusions are decreasing from the prior CT scan. Again some residual. Catheter along the right side of the heart at the edge of the imaging field. Coronary artery calcifications are seen. Hepatobiliary: No focal liver abnormality is seen. Status post cholecystectomy. No biliary dilatation.  Patent portal vein. Pancreas: Unremarkable. No pancreatic ductal dilatation or surrounding inflammatory changes. Spleen: Normal in size without focal abnormality. Adrenals/Urinary Tract: Adrenal glands are unremarkable. Only slight thickening of the left adrenal gland, stable. Kidneys are normal, without renal calculi, focal lesion, or hydronephrosis. Bladder is unremarkable. Stomach/Bowel: Underdistended stomach with significant wall and fold thickening. Please correlate with symptoms. Oral contrast was administered. Small bowel is nondilated except for short segment of proximal jejunum in left mid abdomen which also has some fold/wall thickening. Previous surgical resection with a diverting left-sided colostomy. The bowel proximal to the colostomy is nondilated with some luminal fluid and extensive diverticula. Normal appendix. There is a long stump extending from the mid descending colon. There is significant wall thickening of the stump with luminal fluid and stranding. Please correlate for colitis. Vascular/Lymphatic: Normal caliber aorta and IVC. Diffuse vascular calcifications. Retroaortic left renal vein. No discrete abnormal lymph node enlargement identified in the abdomen and pelvis. There is some small mesenteric nodes identified, not pathologic by size criteria. Reproductive: There is significant fluid along the endometrium of the uterus with some marginal presumed enhancement. This is new from. Please correlate for any history such as infection. No gas associated. Other: Anasarca. Diffuse mesenteric stranding and some dependent simple fluid. There is a loculated fluid collection along the left hemiabdomen in the retroperitoneum and pericolic gutter region. This abuts the suture line of the cysts remaining stump. New from previous. This collection in the axial plane has dimension on image 39 of series 2 of 8.0 by 2.2 cm. Cephalocaudal length on sagittal image 80 of series 6 measures 8.3 cm. This abuts  the lateral margin of the psoas muscle. There is some adjacent jejunal loops which have mild fold thickening. No additional dominant rim enhancing fluid collection. There is a surgical drain entering the left mid abdomen and extending caudal into the dependent pelvis. There is some stranding and gas along the anterior abdominal wall near midline just above the umbilicus. Please correlate for location of the draining collection. Musculoskeletal: Significant skin thickening along the anterior abdominal wall. Anasarca. Scattered degenerative changes. Trace anterolisthesis of L4 on L5. Significant degenerative changes particularly along the facet joints in the lumbar spine. Degenerative changes of the sacroiliac joints. There is significant streak artifact related to the patient's right hip hemiarthroplasty. The femoral component is not completely included in the imaging field. IMPRESSION: Surgical changes left-sided partial colonic resection with diverting colostomy. There is along distal stump involving distal descending colon, rectosigmoid colon. This stump has significant wall thickening with stranding. Please correlate for area of colitis. More proximally the bowel is nondilated. Extensive colonic diverticula. Mild distension with some wall thickening along a loop of jejunum in the left mid abdomen. This abuts the presumed rim enhancing fluid and gas collection along the left pericolic gutter which measures 8.0 x 2.2 x 8.3 cm. Possible abscess. Decreasing pelvic free fluid. There is a surgical drain in the dependent pelvis. No additional rim enhancing fluid collections identified at this time. There is new loculated fluid along the uterus, endometrium measuring 4.4 by 2.9 by 2.4  cm. Please correlate for the etiology of this fluid. This is possible distal area of infection. With the appearance and the other findings please correlate for any possibility of a fistula. Significant fold and wall thickening along the  stomach. Please correlate with any symptoms. The stomach is underdistended. Decreasing pleural effusion adjacent lung opacities with some residual. Recommend continued follow-up. Atelectasis versus infiltrate. Electronically Signed   By: Ranell Bring M.D.   On: 03/22/2024 13:59       Elgie Butter M.D. [name] 03/22/2024, 3:09 PM  Available via Epic secure chat 7am-7pm After 7 pm, please refer to night coverage provider listed on amion.

## 2024-03-22 NOTE — Progress Notes (Signed)
 Return call placed to the patients  daughter [place]. VM message left per pt permission to return call.

## 2024-03-22 NOTE — Progress Notes (Signed)
 Calorie Count Note  Calorie Count continued  Diet: Carb Modified Supplements: [employer] 1.4 TID  7/22: Breakfast: 215 kcals, 2g protein Lunch: 0% Dinner: 0% Supplements: 2 [employer] = 910 kcals, 40g protein  Total intake: 1125 kcal (64% of minimum estimated needs)  42g protein (46% of minimum estimated needs)  Nutrition Dx: Severe Malnutrition related to acute illness as evidenced by energy intake < or equal to 50% for > or equal to 5 days, mild muscle depletion, percent weight loss (10% in 1.5 months).   Goal: Pt to meet >/= 90% of their estimated nutrition needs   Intervention:  -If not able to improve intakes, need to consider Cortrak tube placement and tube feeds.   -Calorie Counts continue   -[employer] 1.4 PO TID, each provides 455 kcals and 20g protein   - Carb Modified diet per MD.             - Assistance with all meals to promote better intake.    Morna Lee, MS, RD, LDN Inpatient Clinical Dietitian Contact via Secure chat

## 2024-03-22 NOTE — Progress Notes (Signed)
 GYN Oncology Progress Note  Came by to check in on patient and discuss CT scan. She had spoken with Dr. Overton with ID earlier this afternoon. She is tearful about the situation. Currently sitting on the side of the bed with PT. No needs or questions voiced per patient at this time. Franky A with IR arrived to room to discuss procedure with patient.

## 2024-03-22 NOTE — Consult Note (Signed)
 Chief Complaint: Abdominal pain, post op left paracolic gutter fluid collection; referred for image guided drainage  Referring Provider(s): Vu,T  Supervising Physician: Karalee Beat  Patient Status: [facility] - In-pt  History of Present Illness: Alexandria Durham is a 78 y.o. female with past medical history of arthritis, chronic kidney disease, COPD, diabetes, GERD, hyperlipidemia, hypertension, hypothyroidism, postmenopausal bleeding /thickened endometrium, recent pancreatitis and acute cholecystitis, status post lap chole and D&C in May of this year who was admitted on July 1 for hysteroscopy and repeat D&C.  The procedure was complicated by perforation of the uterus and injury to sigmoid colon/septic shock.  On 7/2 patient underwent exploratory lap, descending end colostomy, partial colectomy.  She has a left lower quadrant surgical JP drain and wound VAC in place.  Latest CT abdomen pelvis today revealed:  Surgical changes left-sided partial colonic resection with diverting colostomy. There is along distal stump involving distal descending colon, rectosigmoid colon. This stump has significant wall thickening with stranding. Please correlate for area of colitis.   More proximally the bowel is nondilated. Extensive colonic diverticula.   Mild distension with some wall thickening along a loop of jejunum in the left mid abdomen. This abuts the presumed rim enhancing fluid and gas collection along the left pericolic gutter which measures 8.0 x 2.2 x 8.3 cm. Possible abscess.   Decreasing pelvic free fluid. There is a surgical drain in the dependent pelvis. No additional rim enhancing fluid collections identified at this time.   There is new loculated fluid along the uterus, endometrium measuring 4.4 by 2.9 by 2.4 cm. Please correlate for the etiology of this fluid. This is possible distal area of infection. With the appearance and the other findings please correlate for  any possibility of a fistula.   Significant fold and wall thickening along the stomach. Please correlate with any symptoms. The stomach is underdistended.   Decreasing pleural effusion adjacent lung opacities with some residual. Recommend continued follow-up. Atelectasis versus infiltrate.  She is afebrile, WBC 10.1, hemoglobin 8.7, platelets 584k, creatinine normal, previous abdominal fluid cultures with moderate E. Coli, few Enterococcus, few Bacteroides, and Pseudomonas; request now received from ID team for image guided left paracolic gutter fluid collection drainage    Patient is Full Code  Past Medical History:  Diagnosis Date   Arthritis    Chronic kidney disease    CKD 3a   COPD (chronic obstructive pulmonary disease) (HCC)    Diabetes mellitus    Family history of adverse reaction to anesthesia    sister woke up during her eye surgery.   GERD (gastroesophageal reflux disease)    Heart murmur    Hypercholesteremia    Hypertension    Hypothyroidism     Past Surgical History:  Procedure Laterality Date   BREAST CYST EXCISION Left    benign cyst   CHOLECYSTECTOMY N/A 01/17/2024   Procedure: LAPAROSCOPIC CHOLECYSTECTOMY WITH INTRAOPERATIVE CHOLANGIOGRAM;  Surgeon: Sheldon Standing, MD;  Location: [facility];  Service: General;  Laterality: N/A;   COLECTOMY WITH COLOSTOMY CREATION/HARTMANN PROCEDURE  03/01/2024   Procedure: COLECTOMY, WITH COLOSTOMY CREATION;  Surgeon: [name] Mays, MD;  Location: [facility];  Service: Gynecology;;   [name]  03/01/2024   Procedure: CYSTOSCOPY;  Surgeon: [name] Mays, MD;  Location: [facility];  Service: Gynecology;;   DILATION AND CURETTAGE OF UTERUS N/A 01/17/2024   Procedure: DILATION AND CURETTAGE;  Surgeon: [name] Mays, MD;  Location: [facility];  Service: Gynecology;  Laterality: N/A;   EYE SURGERY Left  cataract removal   HEMORRHOID SURGERY     HIP SURGERY     HYSTEROSCOPY WITH D & C N/A 01/06/2024   Procedure: DILATATION AND  CURETTAGE /HYSTEROSCOPY;  Surgeon: Cleatus Moccasin, MD;  Location: [facility];  Service: Gynecology;  Laterality: N/A;  PAP SMEAR (DR TO BRING SUPPLIES)  TUCKERS CARD   HYSTEROSCOPY WITH D & C N/A 02/29/2024   Procedure: HYSTEROSCOPY WITH MYOSURE;  Surgeon: [name] Mays, MD;  Location: [facility];  Service: Gynecology;  Laterality: N/A;  myosure, possible ultrasound guidance   JOINT REPLACEMENT     hip replacement   LAPAROSCOPIC LYSIS OF ADHESIONS  01/06/2024   Procedure: LYSIS, ADHESIONS, LAPAROSCOPIC;  Surgeon: Cleatus Moccasin, MD;  Location: [facility];  Service: Gynecology;;   LAPAROSCOPY N/A 01/06/2024   Procedure: LAPAROSCOPY, DIAGNOSTIC;  Surgeon: Cleatus Moccasin, MD;  Location: [facility];  Service: Gynecology;  Laterality: N/A;   LAPAROSCOPY N/A 02/29/2024   Procedure: LAPAROSCOPY, DIAGNOSTIC;  Surgeon: [name] Mays, MD;  Location: [facility];  Service: Gynecology;  Laterality: N/A;   LAPAROTOMY N/A 03/01/2024   Procedure: LAPAROTOMY, EXPLORATORY;  Surgeon: [name] Mays, MD;  Location: [facility];  Service: Gynecology;  Laterality: N/A;  Possible Ileostomy   MOBILIZATION, SPLENIC FLEXURE, WITH PARTIAL COLECTOMY  03/01/2024   Procedure: MOBILIZATION, SPLENIC FLEXURE, WITH PARTIAL COLECTOMY;  Surgeon: [name] Mays, MD;  Location: [facility];  Service: Gynecology;;   OPERATIVE ULTRASOUND N/A 02/29/2024   Procedure: US  INTRAOPERATIVE;  Surgeon: [name] Mays, MD;  Location: [facility];  Service: Gynecology;  Laterality: N/A;   SIGMOIDOSCOPY N/A 02/29/2024   Procedure: SIGMOIDOSCOPY;  Surgeon: Debby Hila, MD;  Location: [facility];  Service: General;  Laterality: N/A;  Rigid sigmoidoscopy   SIGMOIDOSCOPY  03/01/2024   Procedure: [place];  Surgeon: Debby Hila, MD;  Location: [facility];  Service: General;;   THYROID  SURGERY     TONSILLECTOMY     TUBAL LIGATION      Allergies: Penicillins, Ace inhibitors, Tape, Aspirin, and Codeine  Medications: Prior to Admission medications   Medication Sig Start Date End  Date Taking? Authorizing Provider  bisacodyl  5 MG EC tablet Take 5 mg by mouth daily as needed for moderate constipation.   Yes [provider]  buPROPion  (WELLBUTRIN  XL) 150 MG 24 hr tablet Take 150 mg by mouth every evening.   Yes [provider]  Cholecalciferol  (VITAMIN D3) 25 MCG (1000 UT) CAPS Take 1,000 Units by mouth daily.   Yes [provider]  Cyanocobalamin  (VITAMIN B-12 PO) Take 1 tablet by mouth in the morning.   Yes [provider]  diphenhydramine -acetaminophen  (TYLENOL  PM) 25-500 MG TABS tablet Take 2 tablets by mouth at bedtime as needed (Sleep/Pain).   Yes [provider]  ezetimibe  (ZETIA ) 10 MG tablet Take 10 mg by mouth daily. 10/29/23 10/28/24 Yes [provider]  ferrous sulfate 325 (65 FE) MG tablet Take 325 mg by mouth daily with breakfast.   Yes [provider]  fluticasone  (FLONASE ) 50 MCG/ACT nasal spray Place 2 sprays into both nostrils daily as needed for rhinitis. 02/15/24  Yes Cheryle Page, MD  hydrochlorothiazide  (HYDRODIURIL ) 25 MG tablet Take 1 tablet (25 mg total) by mouth daily. 01/10/24  Yes Barbarann Nest, MD  levothyroxine  (SYNTHROID ) 75 MCG tablet Take 75 mcg by mouth daily before breakfast. 09/22/22  Yes [provider]  losartan  (COZAAR ) 50 MG tablet Take 50 mg by mouth in the morning.   Yes [provider]  magnesium  oxide (MAG-OX) 400 MG tablet Take 400 mg  by mouth daily. 03/01/17  Yes [provider]  methocarbamol  (ROBAXIN ) 500 MG tablet Take 1 tablet (500 mg total) by mouth 2 (two) times daily as needed for muscle spasms. 05/25/21  Yes Joldersma, Logan, PA-C  mometasone (ELOCON) 0.1 % cream Apply 1 Application topically daily as needed (Dermatitis- affected sites). 10/27/23  Yes [provider]  omeprazole (PRILOSEC) 40 MG capsule Take 40 mg by mouth daily before breakfast.   Yes [provider]  polycarbophil (FIBERCON) 625 MG tablet Take 1 tablet  (625 mg total) by mouth 2 (two) times daily. 01/09/24  Yes Barbarann Nest, MD  polyethylene glycol powder (GLYCOLAX /MIRALAX ) 17 GM/SCOOP powder Take 17 g by mouth 2 (two) times daily as needed for moderate constipation. 08/15/15  Yes [provider]  sitaGLIPtin (JANUVIA) 50 MG tablet Take 50 mg by mouth daily. 10/28/22  Yes [provider]  triamcinolone ointment (KENALOG) 0.5 % Apply 1 Application topically 2 (two) times daily as needed (for itching).   Yes [provider]  TYLENOL  8 HOUR ARTHRITIS PAIN 650 MG CR tablet Take 650-1,300 mg by mouth every 8 (eight) hours as needed for pain.   Yes [provider]  ondansetron  (ZOFRAN ) 4 MG tablet Take 1 tablet (4 mg total) by mouth every 6 (six) hours as needed for nausea. 02/15/24   Cheryle Page, MD     Family History  Problem Relation Age of Onset   Breast cancer Neg Hx    Prostate cancer Neg Hx    Pancreatic cancer Neg Hx    Colon cancer Neg Hx    Endometrial cancer Neg Hx    Ovarian cancer Neg Hx     Social History   Socioeconomic History   Marital status: Single    Spouse name: Not on file   Number of children: Not on file   Years of education: Not on file   Highest education level: Not on file  Occupational History   Not on file  Tobacco Use   Smoking status: Former    Types: Cigarettes    Start date: 1967    Passive exposure: Never   Smokeless tobacco: Never  Vaping Use   Vaping status: Never Used  Substance and Sexual Activity   Alcohol use: No   Drug use: No   Sexual activity: Not Currently  Other Topics Concern   Not on file  Social History Narrative   Not on file   Social Drivers of Health   Financial Resource Strain: Not on file  Food Insecurity: No Food Insecurity (02/29/2024)   Hunger Vital Sign    Worried About Running Out of Food in the Last Year: Never true    Ran Out of Food in the Last Year: Never true  Transportation Needs: No Transportation Needs (02/29/2024)    PRAPARE - Administrator, Civil Service (Medical): No    Lack of Transportation (Non-Medical): No  Physical Activity: Not on file  Stress: Not on file  Social Connections: Patient Declined (02/29/2024)   Social Connection and Isolation Panel    Frequency of Communication with Friends and Family: Patient declined    Frequency of Social Gatherings with Friends and Family: Patient declined    Attends Religious Services: Patient declined    Database administrator or Organizations: Patient declined    Attends Banker Meetings: Patient declined    Marital Status: Patient declined       Review of Systems patient currently denies fever, headache,  chest pain, worsening dyspnea, cough, back pain; she does have abdominal pain, intermittent nausea/ vomiting, occasional blood in ostomy  Vital Signs: BP (!) 149/72 (BP Location: Left Arm)   Pulse 94   Temp 98.9 F (37.2 C) (Oral)   Resp 18   Ht 5' 4 (1.626 m)   Wt 155 lb 13.8 oz (70.7 kg)   SpO2 96%   BMI 26.75 kg/m   Advance Care Plan: No documents on file  Physical Exam patient awake, tearful, alert, answering questions okay; chest with some diminished breath sounds bases.  Heart with slightly tachycardic but regular rhythm.  Abdomen soft with wound VAC, intact ostomy, left lower quadrant drain with yellow fluid in JP bulb; no significant lower extremity edema  Imaging: CT ABDOMEN PELVIS W CONTRAST Result Date: 03/22/2024 CLINICAL DATA:  Abdominal wall abscess. Open wound with purulent drainage. Wound VAC in place EXAM: CT ABDOMEN AND PELVIS WITH CONTRAST TECHNIQUE: Multidetector CT imaging of the abdomen and pelvis was performed using the standard protocol following bolus administration of intravenous contrast. RADIATION DOSE REDUCTION: This exam was performed according to the departmental dose-optimization program which includes automated exposure control, adjustment of the mA and/or kV according to patient size  and/or use of iterative reconstruction technique. CONTRAST:  OMNIPAQUE  IOHEXOL  300 MG/ML  SOLN COMPARISON:  Noncontrast CT chest abdomen pelvis 03/05/2024. FINDINGS: Lower chest: Trace bilateral pleural fluid, left greater than right. Adjacent opacities are again seen. The opacities in the pleural effusions are decreasing from the prior CT scan. Again some residual. Catheter along the right side of the heart at the edge of the imaging field. Coronary artery calcifications are seen. Hepatobiliary: No focal liver abnormality is seen. Status post cholecystectomy. No biliary dilatation. Patent portal vein. Pancreas: Unremarkable. No pancreatic ductal dilatation or surrounding inflammatory changes. Spleen: Normal in size without focal abnormality. Adrenals/Urinary Tract: Adrenal glands are unremarkable. Only slight thickening of the left adrenal gland, stable. Kidneys are normal, without renal calculi, focal lesion, or hydronephrosis. Bladder is unremarkable. Stomach/Bowel: Underdistended stomach with significant wall and fold thickening. Please correlate with symptoms. Oral contrast was administered. Small bowel is nondilated except for short segment of proximal jejunum in left mid abdomen which also has some fold/wall thickening. Previous surgical resection with a diverting left-sided colostomy. The bowel proximal to the colostomy is nondilated with some luminal fluid and extensive diverticula. Normal appendix. There is a long stump extending from the mid descending colon. There is significant wall thickening of the stump with luminal fluid and stranding. Please correlate for colitis. Vascular/Lymphatic: Normal caliber aorta and IVC. Diffuse vascular calcifications. Retroaortic left renal vein. No discrete abnormal lymph node enlargement identified in the abdomen and pelvis. There is some small mesenteric nodes identified, not pathologic by size criteria. Reproductive: There is significant fluid along the  endometrium of the uterus with some marginal presumed enhancement. This is new from. Please correlate for any history such as infection. No gas associated. Other: Anasarca. Diffuse mesenteric stranding and some dependent simple fluid. There is a loculated fluid collection along the left hemiabdomen in the retroperitoneum and pericolic gutter region. This abuts the suture line of the cysts remaining stump. New from previous. This collection in the axial plane has dimension on image 39 of series 2 of 8.0 by 2.2 cm. Cephalocaudal length on sagittal image 80 of series 6 measures 8.3 cm. This abuts the lateral margin of the psoas muscle. There is some adjacent jejunal loops which have mild fold thickening. No additional dominant rim  enhancing fluid collection. There is a surgical drain entering the left mid abdomen and extending caudal into the dependent pelvis. There is some stranding and gas along the anterior abdominal wall near midline just above the umbilicus. Please correlate for location of the draining collection. Musculoskeletal: Significant skin thickening along the anterior abdominal wall. Anasarca. Scattered degenerative changes. Trace anterolisthesis of L4 on L5. Significant degenerative changes particularly along the facet joints in the lumbar spine. Degenerative changes of the sacroiliac joints. There is significant streak artifact related to the patient's right hip hemiarthroplasty. The femoral component is not completely included in the imaging field. IMPRESSION: Surgical changes left-sided partial colonic resection with diverting colostomy. There is along distal stump involving distal descending colon, rectosigmoid colon. This stump has significant wall thickening with stranding. Please correlate for area of colitis. More proximally the bowel is nondilated. Extensive colonic diverticula. Mild distension with some wall thickening along a loop of jejunum in the left mid abdomen. This abuts the presumed  rim enhancing fluid and gas collection along the left pericolic gutter which measures 8.0 x 2.2 x 8.3 cm. Possible abscess. Decreasing pelvic free fluid. There is a surgical drain in the dependent pelvis. No additional rim enhancing fluid collections identified at this time. There is new loculated fluid along the uterus, endometrium measuring 4.4 by 2.9 by 2.4 cm. Please correlate for the etiology of this fluid. This is possible distal area of infection. With the appearance and the other findings please correlate for any possibility of a fistula. Significant fold and wall thickening along the stomach. Please correlate with any symptoms. The stomach is underdistended. Decreasing pleural effusion adjacent lung opacities with some residual. Recommend continued follow-up. Atelectasis versus infiltrate. Electronically Signed   By: Ranell Bring M.D.   On: 03/22/2024 13:59   VAS US  LOWER EXTREMITY VENOUS (DVT) Result Date: 03/20/2024  Lower Venous DVT Study Patient Name:  JORDY HEWINS  Date of Exam:   03/20/2024 Medical Rec #: [id number]       Accession #:    [id number] Date of Birth: 02-29-1946       Patient Gender: F Patient Age:   53 years Exam Location:  [facility] Procedure:      VAS US  LOWER EXTREMITY VENOUS (DVT) Referring Phys: OLAM MILL --------------------------------------------------------------------------------  Indications: Pain, and Left lower extremity gout.  Risk Factors: Immobility Status post multiple laparoscopic surgeries this admission including Hysteroscopy with myosure, laparoscopy of the pelvis, abdomen, and sigmoidoscopy of the rectum, colectomy, colostomy creation 02/29/2024 and 03/01/2024. Limitations: Poor ultrasound/tissue interface and edema. Comparison Study: No prior study on file Performing Technologist: Alberta Lis RVS  Examination Guidelines: A complete evaluation includes B-mode imaging, spectral Doppler, color Doppler, and power Doppler as needed of all accessible  portions of each vessel. Bilateral testing is considered an integral part of a complete examination. Limited examinations for reoccurring indications may be performed as noted. The reflux portion of the exam is performed with the patient in reverse Trendelenburg.  +---------+---------------+---------+-----------+----------+-------------------+ RIGHT    CompressibilityPhasicitySpontaneityPropertiesThrombus Aging      +---------+---------------+---------+-----------+----------+-------------------+ CFV      Full           Yes      Yes                                      +---------+---------------+---------+-----------+----------+-------------------+ SFJ      Full                                                             +---------+---------------+---------+-----------+----------+-------------------+  FV Prox  Full           Yes      Yes                                      +---------+---------------+---------+-----------+----------+-------------------+ FV Mid   Full                                                             +---------+---------------+---------+-----------+----------+-------------------+ FV DistalFull                                                             +---------+---------------+---------+-----------+----------+-------------------+ PFV      Full           Yes      Yes                                      +---------+---------------+---------+-----------+----------+-------------------+ POP      Full           Yes      Yes                                      +---------+---------------+---------+-----------+----------+-------------------+ PTV      Full                                         Not well visualized +---------+---------------+---------+-----------+----------+-------------------+ PERO                                                  Not well visualized  +---------+---------------+---------+-----------+----------+-------------------+   +---------+---------------+---------+-----------+----------+-------------------+ LEFT     CompressibilityPhasicitySpontaneityPropertiesThrombus Aging      +---------+---------------+---------+-----------+----------+-------------------+ CFV      Full           Yes      Yes                  Rouleaux flow       +---------+---------------+---------+-----------+----------+-------------------+ SFJ      Full           Yes      Yes                  Rouleaux flow       +---------+---------------+---------+-----------+----------+-------------------+ FV Prox  Full           Yes      Yes                                      +---------+---------------+---------+-----------+----------+-------------------+ FV Mid   Full  Yes      Yes                                      +---------+---------------+---------+-----------+----------+-------------------+ FV DistalFull                                                             +---------+---------------+---------+-----------+----------+-------------------+ PFV      Full           Yes      Yes                                      +---------+---------------+---------+-----------+----------+-------------------+ POP      Full           Yes      Yes                                      +---------+---------------+---------+-----------+----------+-------------------+ PTV      Full                                         Not well visualized +---------+---------------+---------+-----------+----------+-------------------+ PERO                                                  Not well visualized +---------+---------------+---------+-----------+----------+-------------------+     Summary: RIGHT: - There is no evidence of deep vein thrombosis in the lower extremity.  - No cystic structure found in the popliteal fossa. - Ultrasound  characteristics of enlarged lymph nodes are noted in the groin.  LEFT: - There is no evidence of deep vein thrombosis in the lower extremity.  - No cystic structure found in the popliteal fossa. - Ultrasound characteristics of enlarged lymph nodes noted in the groin.  *See table(s) above for measurements and observations. Electronically signed by Lonni Gaskins MD on 03/20/2024 at 3:13:13 PM.    Final    ECHOCARDIOGRAM COMPLETE Result Date: 03/09/2024    ECHOCARDIOGRAM REPORT   Patient Name:   KARALINE BURESH Date of Exam: 03/09/2024 Medical Rec #:  [id number]      Height:       64.0 in Accession #:    [id number]     Weight:       176.8 lb Date of Birth:  22-Dec-1945      BSA:          1.857 m Patient Age:    78 years       BP:           142/81 mmHg Patient Gender: F              HR:           98 bpm. Exam Location:  Inpatient Procedure: 2D Echo, Cardiac Doppler, Color Doppler and Intracardiac  Opacification Agent (Both Spectral and Color Flow Doppler were            utilized during procedure). Indications:    CHF  History:        Patient has no prior history of Echocardiogram examinations.  Sonographer:    Therisa Crouch Referring Phys: [id number] RAVI PAHWANI  Sonographer Comments: Technically difficult study due to poor echo windows. IMPRESSIONS  1. Left ventricular ejection fraction, by estimation, is 65 to 70%. The left ventricle has normal function. The left ventricle has no regional wall motion abnormalities. There is mild concentric left ventricular hypertrophy. Left ventricular diastolic parameters are consistent with Grade I diastolic dysfunction (impaired relaxation).  2. Right ventricular systolic function is normal. The right ventricular size is normal.  3. The mitral valve is normal in structure. No evidence of mitral valve regurgitation. No evidence of mitral stenosis.  4. The aortic valve is tricuspid. There is mild calcification of the aortic valve. Aortic valve regurgitation is not  visualized. Aortic valve sclerosis/calcification is present, without any evidence of aortic stenosis.  5. The inferior vena cava is normal in size with greater than 50% respiratory variability, suggesting right atrial pressure of 3 mmHg. FINDINGS  Left Ventricle: Left ventricular ejection fraction, by estimation, is 65 to 70%. The left ventricle has normal function. The left ventricle has no regional wall motion abnormalities. Definity  contrast agent was given IV to delineate the left ventricular  endocardial borders. The left ventricular internal cavity size was normal in size. There is mild concentric left ventricular hypertrophy. Left ventricular diastolic parameters are consistent with Grade I diastolic dysfunction (impaired relaxation). Right Ventricle: The right ventricular size is normal. No increase in right ventricular wall thickness. Right ventricular systolic function is normal. Left Atrium: Left atrial size was normal in size. Right Atrium: Right atrial size was normal in size. Pericardium: There is no evidence of pericardial effusion. Mitral Valve: The mitral valve is normal in structure. No evidence of mitral valve regurgitation. No evidence of mitral valve stenosis. Tricuspid Valve: The tricuspid valve is normal in structure. Tricuspid valve regurgitation is trivial. No evidence of tricuspid stenosis. Aortic Valve: The aortic valve is tricuspid. There is mild calcification of the aortic valve. Aortic valve regurgitation is not visualized. Aortic valve sclerosis/calcification is present, without any evidence of aortic stenosis. Pulmonic Valve: The pulmonic valve was not well visualized. Pulmonic valve regurgitation is not visualized. No evidence of pulmonic stenosis. Aorta: The aortic root is normal in size and structure. Venous: The inferior vena cava is normal in size with greater than 50% respiratory variability, suggesting right atrial pressure of 3 mmHg. IAS/Shunts: No atrial level shunt detected  by color flow Doppler.  LEFT VENTRICLE PLAX 2D LVIDd:         3.00 cm     Diastology LVIDs:         2.10 cm     LV e' medial:    7.83 cm/s LV PW:         1.10 cm     LV E/e' medial:  8.8 LV IVS:        1.30 cm     LV e' lateral:   7.83 cm/s LVOT diam:     1.80 cm     LV E/e' lateral: 8.8 LVOT Area:     2.54 cm  LV Volumes (MOD) LV vol d, MOD A2C: 61.0 ml LV vol d, MOD A4C: 71.7 ml LV vol s, MOD A2C: 17.3 ml LV vol s,  MOD A4C: 25.6 ml LV SV MOD A2C:     43.7 ml LV SV MOD A4C:     71.7 ml LV SV MOD BP:      47.7 ml RIGHT VENTRICLE         IVC TAPSE (M-mode): 1.9 cm  IVC diam: 1.10 cm LEFT ATRIUM             Index LA diam:        2.90 cm 1.56 cm/m LA Vol (A2C):   39.9 ml 21.49 ml/m LA Vol (A4C):   34.9 ml 18.80 ml/m LA Biplane Vol: 39.7 ml 21.38 ml/m   AORTA Ao Root diam: 3.10 cm Ao Asc diam:  2.90 cm MITRAL VALVE                TRICUSPID VALVE MV Area (PHT): 4.00 cm     TR Peak grad:   28.3 mmHg MV E velocity: 68.60 cm/s   TR Vmax:        266.00 cm/s MV A velocity: 101.00 cm/s MV E/A ratio:  0.68         SHUNTS                             Systemic Diam: 1.80 cm Toribio Fuel MD Electronically signed by Toribio Fuel MD Signature Date/Time: 03/09/2024/10:55:01 PM    Final    DG CHEST PORT 1 VIEW Result Date: 03/09/2024 CLINICAL DATA:  Congestive heart failure. EXAM: PORTABLE CHEST 1 VIEW COMPARISON:  Radiographs 03/05/2024 and 03/02/2024.  CT 03/05/2024. FINDINGS: 0953 hours. Right IJ sheath has been removed in the interval. A right arm PICC has been placed, projecting over the mid right atrium. Persistent low lung volumes with mild bibasilar atelectasis. No edema, significant pleural effusion or pneumothorax. The bones appear unchanged. IMPRESSION: 1. Interval placement of right arm PICC as described. No pneumothorax. 2. Persistent low lung volumes with mild bibasilar atelectasis. Electronically Signed   By: Elsie Perone M.D.   On: 03/09/2024 10:28   US  EKG SITE RITE Result Date: 03/06/2024 If Site  Rite image not attached, placement could not be confirmed due to current cardiac rhythm.  CT CHEST ABDOMEN PELVIS WO CONTRAST Result Date: 03/05/2024 CLINICAL DATA:  Sepsis with increasing white count and delirium. Possible postoperative infection. EXAM: CT CHEST, ABDOMEN AND PELVIS WITHOUT CONTRAST TECHNIQUE: Multidetector CT imaging of the chest, abdomen and pelvis was performed following the standard protocol without IV contrast. RADIATION DOSE REDUCTION: This exam was performed according to the departmental dose-optimization program which includes automated exposure control, adjustment of the mA and/or kV according to patient size and/or use of iterative reconstruction technique. COMPARISON:  Chest radiograph 03/05/2024. CT abdomen and pelvis 02/13/2024 FINDINGS: CT CHEST FINDINGS Cardiovascular: Central venous catheter with tip in the upper SVC. Normal heart size. No pericardial effusions. Calcification of the aorta. No aneurysm. Mediastinum/Nodes: No enlarged mediastinal, hilar, or axillary lymph nodes. Thyroid  gland, trachea, and esophagus demonstrate no significant findings. Lungs/Pleura: Motion artifact. Small bilateral pleural effusions with bilateral basilar consolidation or atelectasis. Diffuse emphysematous changes in the lungs. No pneumothorax. Musculoskeletal: Degenerative changes in the spine. No acute bony abnormalities. CT ABDOMEN PELVIS FINDINGS Hepatobiliary: No focal liver abnormality is seen. Status post cholecystectomy. No biliary dilatation. Pancreas: Unremarkable. No pancreatic ductal dilatation or surrounding inflammatory changes. Spleen: Normal in size without focal abnormality. Adrenals/Urinary Tract: Adrenal glands are unremarkable. Kidneys are normal, without renal calculi, focal lesion, or hydronephrosis. Bladder is  unremarkable. Stomach/Bowel: Stomach, small bowel, and colon are mostly decompressed. Colonic diverticulosis. There is wall thickening and stranding of the sigmoid colon  consistent with acute diverticulitis. There are postoperative changes with a diverting left lower quadrant descending colostomy. There is a superior pelvic air-fluid level measuring about 4.6 cm in diameter. This difficult to determine whether this represents a pericolonic loculated collection/abscess or whether this is within a segment of the colon. Consider obtaining CT images with rectal contrast material. A left lower quadrant pelvic drain catheter is in place. Vascular/Lymphatic: Aortic atherosclerosis. No enlarged abdominal or pelvic lymph nodes. Reproductive: No pelvic mass identified. Other: Small amount of free fluid in the pelvis is likely reactive or postoperative. Anterior abdominal wall defect consistent with surgical wound. Diffuse edema throughout the subcutaneous fatty tissues. Musculoskeletal: Postoperative right hip arthroplasty. Mild anterior subluxation of L4 on L5, probably degenerative. Degenerative changes in the lumbar spine. 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 IMPRESSION: 1. Colonic diverticulosis with evidence of acute diverticulitis in the sigmoid region. 2. Postoperative diverting left lower quadrant descending colostomy. Surgical drain in the pelvis. 3. Small amount of free fluid in the pelvis is likely reactive or postoperative. 4. 4.6 cm diameter air-fluid level in the anterior pelvis in or adjacent to the sigmoid colon. This may represent an abscess or an air-fluid level within a dilated segment of colon. Repeat imaging with rectal contrast material may help to distinguish. 5. Bilateral pleural effusions with basilar atelectasis or infiltration. This may represent compressive atelectasis or pneumonia. 6. Aortic atherosclerosis. 7. Anterior abdominal wall surgical wound. Diffuse subcutaneous edema. Electronically Signed   By: Elsie Gravely M.D.   On: 03/05/2024 22:25   CT HEAD WO CONTRAST ( ) Result Date: 03/05/2024 CLINICAL DATA:   Mental status change EXAM: CT HEAD WITHOUT CONTRAST TECHNIQUE: Contiguous axial images were obtained from the base of the skull through the vertex without intravenous contrast. RADIATION DOSE REDUCTION: This exam was performed according to the departmental dose-optimization program which includes automated exposure control, adjustment of the mA and/or kV according to patient size and/or use of iterative reconstruction technique. COMPARISON:  None Available. FINDINGS: Brain: No hemorrhage or intracranial mass. Mild atrophy. Patchy white matter hypodensity likely chronic small vessel ischemic change. Probable small chronic lacunar infarct left basal ganglia. Suspect small chronic infarcts within the right basal ganglia and white matter. Ventricles are nonenlarged. Vascular: No hyperdense vessels.  Carotid vascular calcification Skull: Normal. Negative for fracture or focal lesion. Sinuses/Orbits: No acute finding. Other: None IMPRESSION: 1. Atrophy and chronic small vessel ischemic changes of the white matter. 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 2. Negative for hemorrhage, mass or large vessel territorial infarct. Electronically Signed   By: Luke Bun M.D.   On: 03/05/2024 22:23   DG CHEST PORT 1 VIEW Result Date: 03/05/2024 CLINICAL DATA:  78 year old female with respiratory failure, sepsis. EXAM: PORTABLE CHEST 1 VIEW COMPARISON:  Portable chest 03/02/2024 and earlier. FINDINGS: Portable AP semi upright view at 0909 hours. Extubated. Right IJ approach vascular catheter remains in place. Enteric tube removed. Similar lung volumes. Normal cardiac size and mediastinal contours. Dense retrocardiac opacity greater on the left. No pneumothorax. Increased pulmonary vascularity, no overt edema. No pleural effusion identified. Paucity of bowel gas in the visible abdomen. Right abdominal probable cholecystectomy surgical clips appear stable. No acute osseous abnormality  identified. IMPRESSION: 1. Extubated. Enteric tube removed. Right IJ approach vascular catheter remains in place. 2. Lower lobe lung  collapse or consolidation greater on the left. Increased pulmonary vascularity since 03/02/2024 but no overt edema. Electronically Signed   By: VEAR Hurst M.D.   On: 03/05/2024 12:45   DG Chest Port 1 View Result Date: 03/02/2024 CLINICAL DATA:  Acute respiratory failure EXAM: PORTABLE CHEST 1 VIEW COMPARISON:  03/01/2024 FINDINGS: Cardiac shadow is stable. Endotracheal tube, gastric catheter and right jugular sheath are again seen and stable. The overall inspiratory effort has improved although basilar atelectasis is noted. No sizable effusion is seen. IMPRESSION: Mild basilar atelectasis. Electronically Signed   By: Oneil Devonshire M.D.   On: 03/02/2024 10:09   DG CHEST PORT 1 VIEW Result Date: 03/01/2024 CLINICAL DATA:  Acute respiratory failure, exploratory laparotomy surgery earlier today. EXAM: PORTABLE CHEST 1 VIEW COMPARISON:  01/09/2018 FINDINGS: Low lung volumes accentuate cardiomediastinal silhouette and pulmonary vascularity. Bibasilar hazy airspace opacities may represent atelectasis or infiltrates. No pleural effusion or pneumothorax. Right IJ CVC tip in the mid SVC. Enteric tube tip and side-port in the stomach. Endotracheal tube tip in the mid intrathoracic trachea 3.9 cm from the carina. Paucity of bowel gas. IMPRESSION: 1. Low lung volumes with bibasilar atelectasis or infiltrates. 2. Support apparatus as described. Electronically Signed   By: Norman Gatlin M.D.   On: 03/01/2024 22:15   US  Intraoperative Result Date: 02/29/2024 CLINICAL DATA:  Ultrasound was provided for use by the ordering physician.  No provider Interpretation or professional fees incurred.     Labs:  CBC: Recent Labs    03/16/24 0438 03/17/24 0711 03/20/24 0858 03/22/24 0812  WBC 8.6 9.0 8.9 10.1  HGB 7.8* 8.2* 8.1* 8.7*  HCT 23.5* 25.1* 25.4* 27.5*  PLT 384 495* 535* 584*     COAGS: No results for input(s): INR, APTT in the last 8760 hours.  BMP: Recent Labs    03/17/24 0711 03/18/24 0612 03/20/24 0525 03/22/24 0812  NA 132* 135 134* 131*  K 4.2 4.1 4.1 4.5  CL 100 102 104 99  CO2 24 25 23 25   GLUCOSE 186* 159* 149* 157*  BUN 24* 23 22 18   CALCIUM  8.5* 8.5* 8.2* 9.1  CREATININE 0.83 0.86 0.77 0.92  GFRNONAA >60 >60 >60 >60    LIVER FUNCTION TESTS: Recent Labs    03/13/24 0518 03/16/24 0438 03/17/24 0711 03/20/24 0525  BILITOT 0.5 0.7 0.5 0.6  AST 90* 48* 59* 44*  ALT 118* 86* 85* 68*  ALKPHOS 144* 126 129* 100  PROT 5.6* 6.3* 6.4* 6.0*  ALBUMIN  1.8* 1.8* 1.8* 1.7*    TUMOR MARKERS: No results for input(s): AFPTM, CEA, CA199, CHROMGRNA in the last 8760 hours.  Assessment and Plan: 78 y.o. female with past medical history of arthritis, chronic kidney disease, COPD, diabetes, GERD, hyperlipidemia, hypertension, hypothyroidism, postmenopausal bleeding /thickened endometrium, recent pancreatitis and acute cholecystitis, status post lap chole and D&C in May of this year who was admitted on July 1 for hysteroscopy and repeat D&C.  The procedure was complicated by perforation of the uterus and injury to sigmoid colon/septic shock.  On 7/2 patient underwent exploratory lap, descending end colostomy, partial colectomy.  She has a left lower quadrant surgical JP drain and wound VAC in place.  Latest CT abdomen pelvis today revealed:  Surgical changes left-sided partial colonic resection with diverting colostomy. There is along distal stump involving distal descending colon, rectosigmoid colon. This stump has significant wall thickening with stranding. Please correlate for area of colitis.   More proximally the bowel is nondilated. Extensive colonic diverticula.  Mild distension with some wall thickening along a loop of jejunum in the left mid abdomen. This abuts the presumed rim enhancing fluid and gas collection along the left  pericolic gutter which measures 8.0 x 2.2 x 8.3 cm. Possible abscess.   Decreasing pelvic free fluid. There is a surgical drain in the dependent pelvis. No additional rim enhancing fluid collections identified at this time.   There is new loculated fluid along the uterus, endometrium measuring 4.4 by 2.9 by 2.4 cm. Please correlate for the etiology of this fluid. This is possible distal area of infection. With the appearance and the other findings please correlate for any possibility of a fistula.   Significant fold and wall thickening along the stomach. Please correlate with any symptoms. The stomach is underdistended.   Decreasing pleural effusion adjacent lung opacities with some residual. Recommend continued follow-up. Atelectasis versus infiltrate.  She is afebrile, WBC 10.1, hemoglobin 8.7, platelets 584k, creatinine normal, previous abdominal fluid cultures with moderate E. Coli, few Enterococcus, few Bacteroides, and Pseudomonas; request now received from ID team for image guided left paracolic gutter fluid collection drainage; imaging studies were reviewed by Dr. Vanice.Risks and benefits discussed with the patient including bleeding, infection, damage to adjacent structures, bowel perforation/fistula connection, and sepsis.  All of the patient's questions were answered, patient is agreeable to proceed. Consent signed and in chart.  Procedure scheduled for 7/24   Thank you for allowing our service to participate in [facility] 's care.  Electronically Signed: D. Franky Rakers, PA-C   03/22/2024, 4:45 PM      I spent a total of 40 Minutes    in face to face in clinical consultation, greater than 50% of which was counseling/coordinating care for image guided drainage of left paracolic gutter fluid collection

## 2024-03-22 NOTE — Progress Notes (Signed)
 Physical Therapy Treatment Patient Details Name: Alexandria Durham MRN: [id number] DOB: [birth year] Today's Date: 03/22/2024   History of Present Illness Patient is a 78 year old female who presented on 03/01/2024 following a D and C on 7/1 for thickened endometrium and procedure complicated due to bowel perforation. Pt developed signs and symptoms of sepsis and shock. 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  Pt required post op ventilation and extubated on 7/4.  Pt PMH includes but is not limited to:  recent hospital admissions for diverticulitis, pancreatitis, abdominal pain, D&C  01/06/24 with uterine perforation, cholecystectomy and d&C on 01/17/24, DM II, HTN, COPD, GERD and CKD III.    PT Comments  Patient crying,, states she received news that she has to have another drain. Patient assisted to sitting onto bed edge, tolerated x  ~ 20'.  Assisted back into bed. Continue PT for  mobility.  HR 101, SPO2 >94%.    If plan is discharge home, recommend the following: Two people to help with walking and/or transfers;A lot of help with bathing/dressing/bathroom;Assistance with cooking/housework;Assist for transportation   Can travel by private Theme park manager cushion (measurements PT);Wheelchair (measurements PT);Hoyer lift;Hospital bed;BSC/3in1    Recommendations for Other Services       Precautions / Restrictions Precautions Precautions: Fall Recall of Precautions/Restrictions: Intact Precaution/Restrictions Comments: Abdominal sx - wound vac, JP drain, ostomy Other Brace: abdominal binder when out of bed Restrictions Other Position/Activity Restrictions: wound vac, JP drain, ostomy     Mobility  Bed Mobility Overal bed mobility: Needs Assistance Bed Mobility: Rolling Rolling: Mod assist Sidelying to sit: Max assist     Sit to sidelying: Used rails, Max assist General bed mobility comments: max assist to roll and  move legs to  bed edge and raise  trunk, posterior lean initially, assisted legs back onto bed.    Transfers                        Ambulation/Gait                   Stairs             Wheelchair Mobility     Tilt Bed    Modified Rankin (Stroke Patients Only)       Balance Overall balance assessment: Needs assistance Sitting-balance support: Bilateral upper extremity supported, Feet supported Sitting balance-Leahy Scale: Poor Sitting balance - Comments: mod progressing to CGA with time. Postural control: Posterior lean                                  Communication Communication Communication: No apparent difficulties Factors Affecting Communication: Reduced clarity of speech  Cognition Arousal: Alert Behavior During Therapy: Flat affect, Lability   PT - Cognitive impairments: No apparent impairments                       PT - Cognition Comments: limited  verbalizations, states she got more bad news, needs another drain Following commands: Intact Following commands impaired: Follows one step commands with increased time    Cueing Cueing Techniques: Verbal cues, Gestural cues, Tactile cues  Exercises      General Comments        Pertinent Vitals/Pain Pain Assessment Faces Pain Scale: Hurts whole lot Pain Location: abdomen Pain Descriptors /  Indicators: Grimacing Pain Intervention(s): Limited activity within patient's tolerance, Monitored during session    Home Living                          Prior Function            PT Goals (current goals can now be found in the care plan section) Acute Rehab PT Goals Patient Stated Goal: not stated PT Goal Formulation: With patient Time For Goal Achievement: 04/05/24 Potential to Achieve Goals: Fair Progress towards PT goals: Progressing toward goals    Frequency    Min 2X/week      PT Plan      Co-evaluation              AM-PAC PT 6 Clicks  Mobility   Outcome Measure  Help needed turning from your back to your side while in a flat bed without using bedrails?: A Lot Help needed moving from lying on your back to sitting on the side of a flat bed without using bedrails?: A Lot Help needed moving to and from a bed to a chair (including a wheelchair)?: Total Help needed standing up from a chair using your arms (e.g., wheelchair or bedside chair)?: Total Help needed to walk in hospital room?: Total Help needed climbing 3-5 steps with a railing? : Total 6 Click Score: 8    End of Session   Activity Tolerance: Patient limited by pain;Patient limited by fatigue Patient left: in bed;with call bell/phone within reach;with bed alarm set Nurse Communication: Mobility status;Other (comment) PT Visit Diagnosis: Difficulty in walking, not elsewhere classified (R26.2);Muscle weakness (generalized) (M62.81);Pain     Time: 8391-8355 PT Time Calculation (min) (ACUTE ONLY): 36 min  Charges:    $Therapeutic Activity: 23-37 mins PT General Charges $$ ACUTE PT VISIT: 1 Visit                     Darice Potters PT Acute Rehabilitation Services Office [phone]    Potters Darice Norris 03/22/2024, 4:55 PM

## 2024-03-22 NOTE — Progress Notes (Signed)
 21 Days Post-Op Procedure(s) (LRB): LAPAROTOMY, EXPLORATORY (N/A) SIGMOIDOSCOPY COLECTOMY, WITH COLOSTOMY CREATION MOBILIZATION, SPLENIC FLEXURE, WITH PARTIAL COLECTOMY CYSTOSCOPY  Subjective: Patient reports doing well this am. Left foot pain is improving. She has not eaten breakfast and has been sipping on a supplemental shake. Patient states she had a few bites of mashed potatoes the day before. Abdominal pain is minimal at this time. She sat up in the chair last pm. States she did not want to get out of bed due to her bodily soreness but she did end up working with PT. No nausea or emesis at this time. Denies chest pain, dyspnea.   Objective: Vital signs in last 24 hours: Temp:  [98 F (36.7 C)-99.1 F (37.3 C)] 98 F (36.7 C) (07/23 0831) Pulse Rate:  [87-91] 91 (07/23 0831) Resp:  [16-18] 16 (07/23 0831) BP: (135-157)/(71-76) 138/71 (07/23 0831) SpO2:  [97 %-100 %] 100 % (07/23 0831) Last BM Date : 03/20/24  Intake/Output from previous day: 07/22 0701 - 07/23 0700 In: 307 [P.O.:297; I.V.:10] Out: 1655 [Urine:1500; Drains:5; Stool:150]  Physical Examination: General: Alert, oriented, no acute distress, in bed Respiratory: Normal work of breathing. Lungs clear to auscultation bilaterally without rhonchi. Cardiovascular: Regular sinus rhythm and rate, no murmurs or rubs. Abdomen: soft, mildly rounded, + BS, tender to palpation diffusely (stable). Incision: wound VAC in place to suction-serosanguinous drainage in the canister. JP drain with clear serous fluid. Dry dressing around drain. Ostomy with soft stool and flatus.    Extremities: extremities warm and well perfused, tenderness with movement of left LE improving Gu: Purewick in place with clear yellow urine  Labs:    Latest Ref Rng & Units 03/22/2024    8:12 AM 03/20/2024    8:58 AM 03/17/2024    7:11 AM  CBC  WBC 4.0 - 10.5 K/uL 10.1  8.9  9.0   Hemoglobin 12.0 - 15.0 g/dL 8.7  8.1  8.2   Hematocrit 36.0 - 46.0 % 27.5   25.4  25.1   Platelets 150 - 400 K/uL 584  535  495       Latest Ref Rng & Units 03/22/2024    8:12 AM 03/20/2024    5:25 AM 03/18/2024    6:12 AM  BMP  Glucose 70 - 99 mg/dL 842  850  840   BUN 8 - 23 mg/dL 18  22  23    Creatinine 0.44 - 1.00 mg/dL 9.07  9.22  9.13   Sodium 135 - 145 mmol/L 131  134  135   Potassium 3.5 - 5.1 mmol/L 4.5  4.1  4.1   Chloride 98 - 111 mmol/L 99  104  102   CO2 22 - 32 mmol/L 25  23  25    Calcium  8.9 - 10.3 mg/dL 9.1  8.2  8.5    Assessment: 78 y.o. s/p Procedure(s): LAPAROTOMY, EXPLORATORY SIGMOIDOSCOPY COLECTOMY, WITH COLOSTOMY CREATION MOBILIZATION, SPLENIC FLEXURE, WITH PARTIAL COLECTOMY CYSTOSCOPY.  No known gyn cancer diagnosis at this time. Endometrial tissue has not been obtained with D&C procedures performed due to thickened endometrium.  Post-Op: TPN discontinued 03/20/24 per Dr. [name]. Continue to monitor patient's intake. Supplemental shakes scheduled throughout the day. Plan for CT scan today recommended by Dr. Overton with ID. Overall working on post-op milestones. Having ostomy output. Encouraged increasing mobility with assist but patient hesitant given left foot pain. Excellent UOP. Continues to have decreased appetite-remeron  started 03/21/2024 with hopes to stimulate appetite. Minimal oral intake noted on I&O. New onset left  foot pain over the weekend-doppler negative, hx gout, colchicine  ordered.   Per Dr. [name], when patient is discharged, JP drain can be removed and will need to have a wick placed in the opening to promote drainage.   Leukocytosis: WBC 10.1 this am. Purulent drainage from around JP drain site noted on 03/09/24- culture with few/rare e coli and enterococcus - susceptibilities returned 03/13/24. Afebrile. With [facility] dressing change 03/13/24, purulent drainage noted coming from the anterior aspect of the incision-culture obtained-moderate e coli and moderate psuedomonas aeruginosa. Given sensitivities, abx changed to meropenem  on  03/16/24 and on zyvox  oral. Plan for repeat CT imaging today to help guide abx therapy.   Malnutrition: Albumin  1.7 on 03/20/24 am. Continue nutritional supplements. TPN discontinued 03/20/24.    Hyponatremia: asymptomatic, Na+131 this am.   Acute on chronic anemia: asymptomatic, continue to monitor.   Type 2 diabetes mellitus: Continue insulin  sliding scale.  CBG (last 3)  Recent Labs    03/21/24 1620 03/21/24 2350 03/22/24 0549  GLUCAP 136* 149* 108*      Prophylaxis: SCDs, on lovenox   Physical deconditioning: PT ordered. Up and out of bed as able. Plan for SNF when medically stable for discharge.    LOS: 22 days    Alexandria Durham 03/22/2024, 9:55 AM

## 2024-03-22 NOTE — Progress Notes (Signed)
 [facility]  Date of Admission:  02/29/2024     Lines:  7/1-c rue picc 7/1-c left lower abd surgical wound into pelvis cavity   Abx: 7/17-c meropenem  7/14-c linezolid    7/15-17 piptazo 7/14-15 cefepime  7/12-14 amox-clav 7/02-11 piptazo                                                                Assessment: 78 y.o. female postmenopausal bleeding s/p multiple endometrial d&c complicated by uterine perforation and sigmoid colon injury s/p most recent procedure 7/02 including ex lap, descending end colostomy, partial colectomy, and also in 01/17/24 laparoscopic cholecystectomy for gallstone pancreatitis/cholecystitis, currently here since 02/29/2024, id consulted for concern of mdro infection of open wound/abd wall abscess    Recent procedures: surgical procedures since 12/2023: 01/06/24 laparoscopic diagnostic, with lysis of adhesion (same day d&c and hysteroscopy complicated by uterine perforation) 01/17/24 D&C for thickened endometrium; laparoscopic cholecystectomy with intra-op cholangiogram for chronic cholecystitis and gallstone pancreatitis 02/29/24 hysteroscopy with myosure complicated by sigmoid colon injury; diagnostic laparoscopy; diagnostic rigid sigmoidoscopy --> no full thickness sigmoid colon injury noted 7/02 ex Lap, partial colectomy, descending end colostomy, cystoscopy, endometrial biopsy.    Dr Lyda team is concerned about and abd wall abscess by clinical finding of a fluctuance mass within the abd open wound. There is on 7/14 cx as below   There is on 7/6 abd/pelv ct with I presume 7/10 jp drain cx with nonesbl ecoli and e species and bacteroides   7/14 abd wound swab? Cx ecoli (I amp/sulb; R cipro/bactrim; S piptazo), PsA (R pitpazo, ceftaz, cefepime ; S imipenem, gent, cipro) 7/10 JP drainage Ecoli 2 species with one (I cefazolin ; R cipro/bactrim, amp-sulb; both S piptazo); enterococcus species (R amp; S vanc)     7/22 she  said overall pain in abd slowly better but still moderate degree. Wound vac on exam today no purulence; no cellulitis surrounding the vac; the jp drain is clear serous fluid      Would benefit from a repeat ct of abd to ascertain presence or lack of abscess to determine abx duration     ---------------- 03/22/24 id assessment 7/22 Ct abd pelv showed left paracolic abscess abutting abd midline suspect source for the purulent drainage into the wound. There is also a new fluid collection in the pelvis area ?extension of the previous one  ?ongoing leakage of anastomosis Discussed finding with gyn-surg   In setting of rapid development or resistant organism, especially pseudomonas, we would hope to get source control as soon as feasible.    Plan: Continue meropenem  and linezolid  Have asked IR to review case to see if new drains can be placed and send for aerobic/anaerobic culture Defer to gyn-surg to review for possible ongoing anastomosis leak in setting of new abscesses Maintain contact isolation Discussed with primary team  Principal Problem:   Postmenopausal bleeding Active Problems:   Protein-calorie malnutrition, severe   Allergies  Allergen Reactions   Penicillins Nausea And Vomiting, Palpitations, Dermatitis, Rash and Other (See Comments)    Patient reported an elevated heart rate Tolerated Zosyn  July 2025   Ace Inhibitors Hives and Itching   Tape Other (See Comments)    Skin is sensitive   Aspirin  Palpitations   Codeine Palpitations    Scheduled Meds:  buPROPion   150 mg Oral QPM   Chlorhexidine  Gluconate Cloth  6 each Topical Daily   enoxaparin  (LOVENOX ) injection  40 mg Subcutaneous Q24H   ezetimibe   10 mg Oral Daily   feeding supplement (KATE FARMS STANDARD 1.4)  325 mL Oral TID BM   gabapentin   300 mg Oral TID   insulin  aspart  0-20 Units Subcutaneous Q6H   levothyroxine   75 mcg Oral QAC breakfast   lidocaine   1 patch Transdermal Q24H   linagliptin   5 mg Oral  Daily   linezolid   600 mg Oral Q12H   melatonin  3 mg Oral QHS   mirtazapine   7.5 mg Oral QHS   multivitamin with minerals  1 tablet Oral Daily   pantoprazole   40 mg Oral QHS   polyethylene glycol  17 g Oral Daily   sodium chloride  flush  10-40 mL Intracatheter Q12H   sodium chloride  flush  10-40 mL Intracatheter Q12H   Continuous Infusions:  meropenem  (MERREM ) IV 1 g (03/22/24 1351)   PRN Meds:.hydrALAZINE , [DISCONTINUED]  HYDROmorphone  (DILAUDID ) injection **OR** HYDROmorphone  (DILAUDID ) injection, labetalol , methocarbamol , ondansetron  **OR** ondansetron  (ZOFRAN ) IV, mouth rinse, oxyCODONE , simethicone , sodium chloride  flush, sodium chloride  flush   SUBJECTIVE: Tired overall, abd pain moderate Ct result reviewed  Afebrile Osteomy no increased output No rash No n/v  Review of Systems: ROS All other ROS was negative, except mentioned above     OBJECTIVE: Vitals:   03/21/24 2033 03/22/24 0438 03/22/24 0831 03/22/24 1444  BP: 135/74 (!) 157/76 138/71 (!) 149/72  Pulse: 91 87 91 94  Resp: 18 18 16 18   Temp: 99.1 F (37.3 C) 98.4 F (36.9 C) 98 F (36.7 C) 98.9 F (37.2 C)  TempSrc: Oral Oral Oral Oral  SpO2: 100% 97% 100% 96%  Weight:      Height:       Body mass index is 26.75 kg/m.  Physical Exam  General/constitutional: no distress, pleasant HEENT: Normocephalic, PER, Conj Clear, EOMI, Oropharynx clear Neck supple CV: rrr no mrg Lungs: clear to auscultation, normal respiratory effort Abd: Soft, left sided osteomy; left sided perc drain into pelvis; wound vac midline wound -- reservoir with serosanguinous fluid Ext: no edema Skin: No Rash Neuro: nonfocal -- generalized weakness MSK: no peripheral joint swelling/tenderness/warmth  Central line presence: rue picc site no purulence/tenderness  Lab Results Lab Results  Component Value Date   WBC 10.1 03/22/2024   HGB 8.7 (L) 03/22/2024   HCT 27.5 (L) 03/22/2024   MCV 91.7 03/22/2024   PLT 584 (H)  03/22/2024    Lab Results  Component Value Date   CREATININE 0.92 03/22/2024   BUN 18 03/22/2024   NA 131 (L) 03/22/2024   K 4.5 03/22/2024   CL 99 03/22/2024   CO2 25 03/22/2024    Lab Results  Component Value Date   ALT 68 (H) 03/20/2024   AST 44 (H) 03/20/2024   ALKPHOS 100 03/20/2024   BILITOT 0.6 03/20/2024      Microbiology: Recent Results (from the past 240 hours)  Aerobic Culture w Gram Stain (superficial specimen)     Status: None   Collection Time: 03/13/24  2:21 PM   Specimen: Abdomen  Result Value Ref Range Status   Specimen Description   Final    ABDOMEN Performed at [facility], [address] [place], [place] [address]    Special Requests   Final    NONE Performed  at [facility], [address], [place], [place] [address]    Gram Stain   Final    RARE WBC PRESENT, PREDOMINANTLY PMN RARE GRAM NEGATIVE RODS Performed at [facility], [address] [place], [place] [address]    Culture   Final    MODERATE ESCHERICHIA COLI Two isolates with different morphologies were identified as the same organism.The most resistant organism was reported. MODERATE PSEUDOMONAS AERUGINOSA    Report Status 03/16/2024 FINAL  Final   Organism ID, Bacteria ESCHERICHIA COLI  Final   Organism ID, Bacteria PSEUDOMONAS AERUGINOSA  Final      Susceptibility   Escherichia coli - MIC*    AMPICILLIN >=32 RESISTANT Resistant     CEFEPIME  <=0.12 SENSITIVE Sensitive     CEFTAZIDIME <=1 SENSITIVE Sensitive     CEFTRIAXONE  <=0.25 SENSITIVE Sensitive     CIPROFLOXACIN >=4 RESISTANT Resistant     GENTAMICIN <=1 SENSITIVE Sensitive     IMIPENEM <=0.25 SENSITIVE Sensitive     TRIMETH/SULFA >=320 RESISTANT Resistant     AMPICILLIN/SULBACTAM 16 INTERMEDIATE Intermediate     PIP/TAZO <=4 SENSITIVE Sensitive ug/mL    * MODERATE ESCHERICHIA COLI   Pseudomonas aeruginosa - MIC*    CEFTAZIDIME >=64 RESISTANT Resistant     CIPROFLOXACIN 0.5  SENSITIVE Sensitive     GENTAMICIN 2 SENSITIVE Sensitive     IMIPENEM 1 SENSITIVE Sensitive     CEFEPIME  >=32 RESISTANT Resistant     * MODERATE PSEUDOMONAS AERUGINOSA     Serology:   Imaging: If present, new imagings (plain films, ct scans, and mri) have been personally visualized and interpreted; radiology reports have been reviewed. Decision making incorporated into the Impression / Recommendations.  7/23 ct abd pelv with contrast IMPRESSION: Surgical changes left-sided partial colonic resection with diverting colostomy. There is along distal stump involving distal descending colon, rectosigmoid colon. This stump has significant wall thickening with stranding. Please correlate for area of colitis.   More proximally the bowel is nondilated. Extensive colonic diverticula.   Mild distension with some wall thickening along a loop of jejunum in the left mid abdomen. This abuts the presumed rim enhancing fluid and gas collection along the left pericolic gutter which measures 8.0 x 2.2 x 8.3 cm. Possible abscess.   Decreasing pelvic free fluid. There is a surgical drain in the dependent pelvis. No additional rim enhancing fluid collections identified at this time.   There is new loculated fluid along the uterus, endometrium measuring 4.4 by 2.9 by 2.4 cm. Please correlate for the etiology of this fluid. This is possible distal area of infection. With the appearance and the other findings please correlate for any possibility of a fistula.   Significant fold and wall thickening along the stomach. Please correlate with any symptoms. The stomach is underdistended.   Decreasing pleural effusion adjacent lung opacities with some residual. Recommend continued follow-up. Atelectasis versus infiltrate.  Constance ONEIDA Passer, MD [facility] [phone] pager    03/22/2024, 3:39 PM

## 2024-03-23 ENCOUNTER — Inpatient Hospital Stay (HOSPITAL_COMMUNITY)

## 2024-03-23 DIAGNOSIS — R1084 Generalized abdominal pain: Secondary | ICD-10-CM | POA: Diagnosis not present

## 2024-03-23 DIAGNOSIS — N950 Postmenopausal bleeding: Secondary | ICD-10-CM | POA: Diagnosis not present

## 2024-03-23 DIAGNOSIS — E43 Unspecified severe protein-calorie malnutrition: Secondary | ICD-10-CM | POA: Diagnosis not present

## 2024-03-23 DIAGNOSIS — R1030 Lower abdominal pain, unspecified: Secondary | ICD-10-CM | POA: Diagnosis not present

## 2024-03-23 LAB — GLUCOSE, CAPILLARY
Glucose-Capillary: 112 mg/dL — ABNORMAL HIGH (ref 70–99)
Glucose-Capillary: 114 mg/dL — ABNORMAL HIGH (ref 70–99)
Glucose-Capillary: 151 mg/dL — ABNORMAL HIGH (ref 70–99)
Glucose-Capillary: 85 mg/dL (ref 70–99)
Glucose-Capillary: 90 mg/dL (ref 70–99)

## 2024-03-23 MED ORDER — FENTANYL CITRATE (PF) 100 MCG/2ML IJ SOLN
INTRAMUSCULAR | Status: AC
Start: 2024-03-23 — End: 2024-03-23
  Filled 2024-03-23: qty 2

## 2024-03-23 MED ORDER — COLCHICINE 0.6 MG PO TABS
0.6000 mg | ORAL_TABLET | Freq: Two times a day (BID) | ORAL | Status: AC
  Administered 2024-03-23 – 2024-03-25 (×6): 0.6 mg via ORAL
  Filled 2024-03-23 (×6): qty 1

## 2024-03-23 MED ORDER — MIDAZOLAM HCL 2 MG/2ML IJ SOLN
INTRAMUSCULAR | Status: AC
  Filled 2024-03-23: qty 4

## 2024-03-23 MED ORDER — MIDAZOLAM HCL 2 MG/2ML IJ SOLN
INTRAMUSCULAR | Status: AC | PRN
Start: 2024-03-23 — End: 2024-03-23
  Administered 2024-03-23 (×2): 1 mg via INTRAVENOUS

## 2024-03-23 NOTE — Progress Notes (Signed)
 [name]                                                                               Alexandria Durham, is a 78 y.o. female, DOB - [birth year], FMW:[id number] Admit date - 02/29/2024    Outpatient Primary MD for the patient is [name], NP  LOS - 23  days    Brief summary   78 y.o.  F with PMH significant for thickened endometrium, HTN, Type 2 DM, CKD stage IIIa, recent pancreatitis and acute cholecystitis s/p lap chole and D&C in May of this year who was admitted 7/1 for hysteroscopy and repeat D&C.  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 Pt was acidotic post-procedure, so decision made to leave intubated, PCCM consulted in this setting.  Significant events as below.  Patient was eventually transferred under [facility] to comanage on 03/07/2024 while GYN oncology remains the primary service. Significant events Patient was admitted to the ICU postop intubated on the 7/2 extubated 7/ 4.  CT abd and pelvis on 7/23 showing new abscesses. IR consulted and she underwent Removal of surgical drain  And placement of left paracolic gutter 58F drain yielding 10 mL purulent fluid. Fluid to be sent for analysis.    Assessment & Plan    Assessment and Plan:  Left foot gouty attack On colchicine  0.6 mg BID. She reports left foot pain has improved. Encouraged her to ambulate and get out of bed.  Xrays of the foot ordered for further evaluation.  Ambulate as tolerated.    S/p D&C procedure complicated by bowel perforation S/p partial colectomy and ileostomy  Septic shock,  shock resolved  Presented for d&c on 7/1 c/b bowel perforation. OR 7/2 after development of septic shock, zosyn  started.  Had ex lap with sigmoidoscopy, partial colectomy  and ileostomy placement.  CT AP 7/6 showing sigmoid diverticulitis, air-fluid level 4.6cm unable to  discern abscess vs segment of colon.  Repeat CT abd and pelvis on 7/23 shows rim enhancing fluid and gas collection along the left pericolic gutter which measures 8.0 x 2.2 x 8.3 cm. Suspicious for abscess.  There is  also new loculated fluid along the uterus, endometrium measuring 4.4 by 2.9 by 2.4 cm. Evaluate for infection and possible fistula CT abd also shows thickened folds in the stomach, unclear etiology, similar presentation in June 5 th CT.  Culture from JP drain from 7/10  with E coli and Enterococcus , bacteroides. Patient is on meropenem  and zyvox .  IR consulted and she underwent Removal of surgical drain and placement of left paracolic gutter 58F drain yielding 10 mL purulent fluid ID on board and appreciate recommendations.   Pain control with dilaudid  1 mg every 2 hours prn, oxycodone  10 to 15 mg every 4 hours prn for moderate pain, and neurontin  300 mg TID.    Type 2 DM CBG (last 3)  Recent Labs    03/23/24 0006 03/23/24 0624 03/23/24 1233  GLUCAP 112* 90 114*   Resume SSI. No changes in meds.  Hypertension Bp parameters are optimal.    Hypothyroidism Resume synthroid  75 mcg daily.    Hyperlipidemia Resume zetia .   Acute on stage 3a CKD Resolved..    Chest pain  Resolved.    Hyponatremia Sodium is 131, asymptomatic.  Recheck in am.    Hyperkalemia Resolved with lokelma .    Anemia of blood loss from surgery.  Hemoglobin around 8.7. monitor and transfuse to keep it greater than 7.    Thrombocytosis  Possibly from the infection.    RN Pressure Injury Documentation:    Malnutrition Type:  Nutrition Problem: Severe Malnutrition Etiology: acute illness   Malnutrition Characteristics:  Signs/Symptoms: energy intake < or equal to 50% for > or equal to 5 days, mild muscle depletion, percent weight loss (10% in 1.5 months) Percent weight loss: 10 % (in 1.5 months)   Nutrition Interventions:  Interventions: Refer to RD note for  recommendations  Estimated body mass index is 26.75 kg/m as calculated from the following:   Height as of this encounter: 5' 4 (1.626 m).   Weight as of this encounter: 70.7 kg.   Antimicrobials:   Anti-infectives (From admission, onward)    Start     Dose/Rate Route Frequency Ordered Stop   03/16/24 1230  meropenem  (MERREM ) 1 g in sodium chloride  0.9 % 100 mL IVPB        1 g 200 mL/hr over 30 Minutes Intravenous Every 8 hours 03/16/24 1134 03/29/24 2359   03/14/24 1000  linezolid  (ZYVOX ) tablet 600 mg        600 mg Oral Every 12 hours 03/14/24 0903 03/26/24 2359   03/14/24 0930  piperacillin -tazobactam (ZOSYN ) IVPB 3.375 g  Status:  Discontinued        3.375 g 12.5 mL/hr over 240 Minutes Intravenous Every 8 hours 03/14/24 0833 03/16/24 1134   03/13/24 1300  linezolid  (ZYVOX ) IVPB 600 mg  Status:  Discontinued        600 mg 300 mL/hr over 60 Minutes Intravenous Every 12 hours 03/13/24 1200 03/14/24 0903   03/13/24 1300  ceFEPIme  (MAXIPIME ) 2 g in sodium chloride  0.9 % 100 mL IVPB  Status:  Discontinued        2 g 200 mL/hr over 30 Minutes Intravenous Every 8 hours 03/13/24 1200 03/14/24 0833   03/10/24 1200  amoxicillin -clavulanate (AUGMENTIN ) 875-125 MG per tablet 1 tablet  Status:  Discontinued        1 tablet Oral Every 12 hours 03/10/24 1114 03/13/24 1200   03/01/24 1200  piperacillin -tazobactam (ZOSYN ) IVPB 3.375 g  Status:  Discontinued        3.375 g 12.5 mL/hr over 240 Minutes Intravenous Every 8 hours 03/01/24 0946 03/01/24 1116   03/01/24 1130  piperacillin -tazobactam (ZOSYN ) IVPB 3.375 g  Status:  Discontinued        3.375 g 12.5 mL/hr over 240 Minutes Intravenous Every 8 hours 03/01/24 1116 03/10/24 1114        Medications  Scheduled Meds:  buPROPion   150 mg Oral QPM   Chlorhexidine  Gluconate Cloth  6 each Topical Daily   colchicine   0.6 mg Oral BID   [START ON 03/24/2024] enoxaparin  (LOVENOX ) injection  40 mg Subcutaneous Q24H   ezetimibe   10 mg Oral Daily    feeding supplement (KATE FARMS STANDARD 1.4)  325 mL Oral TID BM   gabapentin   300 mg Oral TID   insulin  aspart  0-20 Units Subcutaneous Q6H   levothyroxine   75 mcg Oral QAC breakfast   lidocaine   1 patch Transdermal Q24H   linagliptin   5 mg Oral Daily   linezolid   600 mg Oral Q12H   melatonin  3 mg Oral QHS   mirtazapine   7.5 mg Oral QHS   multivitamin with minerals  1 tablet Oral Daily   pantoprazole   40 mg Oral QHS   polyethylene glycol  17 g Oral Daily   sodium chloride  flush  10-40 mL Intracatheter Q12H   sodium chloride  flush  10-40 mL Intracatheter Q12H   Continuous Infusions:  meropenem  (MERREM ) IV 1 g (03/23/24 1352)   PRN Meds:.hydrALAZINE , [DISCONTINUED]  HYDROmorphone  (DILAUDID ) injection **OR** HYDROmorphone  (DILAUDID ) injection, labetalol , methocarbamol , midazolam , ondansetron  **OR** ondansetron  (ZOFRAN ) IV, mouth rinse, oxyCODONE , simethicone , sodium chloride  flush, sodium chloride  flush    Subjective:   Geneveive Furness was seen and examined today. Encouraged to get out of bed. Left foot pain is better.   Objective:   Vitals:   03/22/24 1444 03/22/24 2026 03/23/24 0621 03/23/24 1300  BP: (!) 149/72 114/74 (!) 150/73 121/79  Pulse: 94 92 93 98  Resp: 18 16 16 16   Temp: 98.9 F (37.2 C) 98.7 F (37.1 C) 98.9 F (37.2 C) 98.4 F (36.9 C)  TempSrc: Oral Oral Oral Oral  SpO2: 96% 97% 97% 98%  Weight:      Height:        Intake/Output Summary (Last 24 hours) at 03/23/2024 1603 Last data filed at 03/23/2024 1348 Gross per 24 hour  Intake 830 ml  Output 2495 ml  Net -1665 ml   Filed Weights   03/17/24 0500 03/18/24 0500 03/19/24 0500  Weight: 76.1 kg 76.3 kg 70.7 kg     Exam General exam: Appears calm and comfortable  Respiratory system: Clear to auscultation. Respiratory effort normal. Cardiovascular system: S1 & S2 heard, RRR. No JVD,  Gastrointestinal system: Abdomen is soft, colostomy bag in place. JP drain in place.   Central nervous system:  Alert and oriented.  Extremities: no pedal edema.  Skin: No rashes,  Psychiatry : anxious.       Data Reviewed:  I have personally reviewed following labs and imaging studies   CBC Lab Results  Component Value Date   WBC 10.1 03/22/2024   RBC 3.00 (L) 03/22/2024   HGB 8.7 (L) 03/22/2024   HCT 27.5 (L) 03/22/2024   MCV 91.7 03/22/2024   MCH 29.0 03/22/2024   PLT 584 (H) 03/22/2024   MCHC 31.6 03/22/2024   RDW 16.3 (H) 03/22/2024   LYMPHSABS 1.9 03/22/2024   MONOABS 0.8 03/22/2024   EOSABS 0.1 03/22/2024   BASOSABS 0.1 03/22/2024     Last metabolic panel Lab Results  Component Value Date   NA 131 (L) 03/22/2024   K 4.5 03/22/2024   CL 99 03/22/2024   CO2 25 03/22/2024   BUN 18 03/22/2024   CREATININE 0.92 03/22/2024   GLUCOSE 157 (H) 03/22/2024   GFRNONAA >60 03/22/2024   GFRAA 51 (L) 03/08/2016   CALCIUM  9.1 03/22/2024   PHOS 3.4 03/20/2024   PROT 6.0 (L) 03/20/2024   ALBUMIN  1.7 (L) 03/20/2024   BILITOT 0.6 03/20/2024   ALKPHOS 100 03/20/2024   AST 44 (H) 03/20/2024   ALT 68 (H) 03/20/2024   ANIONGAP 7 03/22/2024    CBG (last 3)  Recent Labs    03/23/24 0006 03/23/24 0624 03/23/24 1233  GLUCAP 112* 90 114*      Coagulation Profile: No results for input(s): INR, PROTIME in the last 168 hours.   Radiology Studies: CT ABDOMEN  PELVIS W CONTRAST Result Date: 03/22/2024 CLINICAL DATA:  Abdominal wall abscess. Open wound with purulent drainage. Wound VAC in place EXAM: CT ABDOMEN AND PELVIS WITH CONTRAST TECHNIQUE: Multidetector CT imaging of the abdomen and pelvis was performed using the standard protocol following bolus administration of intravenous contrast. RADIATION DOSE REDUCTION: This exam was performed according to the departmental dose-optimization program which includes automated exposure control, adjustment of the mA and/or kV according to patient size and/or use of iterative reconstruction technique. CONTRAST:  OMNIPAQUE  IOHEXOL  300  MG/ML  SOLN COMPARISON:  Noncontrast CT chest abdomen pelvis 03/05/2024. FINDINGS: Lower chest: Trace bilateral pleural fluid, left greater than right. Adjacent opacities are again seen. The opacities in the pleural effusions are decreasing from the prior CT scan. Again some residual. Catheter along the right side of the heart at the edge of the imaging field. Coronary artery calcifications are seen. Hepatobiliary: No focal liver abnormality is seen. Status post cholecystectomy. No biliary dilatation. Patent portal vein. Pancreas: Unremarkable. No pancreatic ductal dilatation or surrounding inflammatory changes. Spleen: Normal in size without focal abnormality. Adrenals/Urinary Tract: Adrenal glands are unremarkable. Only slight thickening of the left adrenal gland, stable. Kidneys are normal, without renal calculi, focal lesion, or hydronephrosis. Bladder is unremarkable. Stomach/Bowel: Underdistended stomach with significant wall and fold thickening. Please correlate with symptoms. Oral contrast was administered. Small bowel is nondilated except for short segment of proximal jejunum in left mid abdomen which also has some fold/wall thickening. Previous surgical resection with a diverting left-sided colostomy. The bowel proximal to the colostomy is nondilated with some luminal fluid and extensive diverticula. Normal appendix. There is a long stump extending from the mid descending colon. There is significant wall thickening of the stump with luminal fluid and stranding. Please correlate for colitis. Vascular/Lymphatic: Normal caliber aorta and IVC. Diffuse vascular calcifications. Retroaortic left renal vein. No discrete abnormal lymph node enlargement identified in the abdomen and pelvis. There is some small mesenteric nodes identified, not pathologic by size criteria. Reproductive: There is significant fluid along the endometrium of the uterus with some marginal presumed enhancement. This is new from. Please  correlate for any history such as infection. No gas associated. Other: Anasarca. Diffuse mesenteric stranding and some dependent simple fluid. There is a loculated fluid collection along the left hemiabdomen in the retroperitoneum and pericolic gutter region. This abuts the suture line of the cysts remaining stump. New from previous. This collection in the axial plane has dimension on image 39 of series 2 of 8.0 by 2.2 cm. Cephalocaudal length on sagittal image 80 of series 6 measures 8.3 cm. This abuts the lateral margin of the psoas muscle. There is some adjacent jejunal loops which have mild fold thickening. No additional dominant rim enhancing fluid collection. There is a surgical drain entering the left mid abdomen and extending caudal into the dependent pelvis. There is some stranding and gas along the anterior abdominal wall near midline just above the umbilicus. Please correlate for location of the draining collection. Musculoskeletal: Significant skin thickening along the anterior abdominal wall. Anasarca. Scattered degenerative changes. Trace anterolisthesis of L4 on L5. Significant degenerative changes particularly along the facet joints in the lumbar spine. Degenerative changes of the sacroiliac joints. There is significant streak artifact related to the patient's right hip hemiarthroplasty. The femoral component is not completely included in the imaging field. IMPRESSION: Surgical changes left-sided partial colonic resection with diverting colostomy. There is along distal stump involving distal descending colon, rectosigmoid colon. This stump has significant wall  thickening with stranding. Please correlate for area of colitis. More proximally the bowel is nondilated. Extensive colonic diverticula. Mild distension with some wall thickening along a loop of jejunum in the left mid abdomen. This abuts the presumed rim enhancing fluid and gas collection along the left pericolic gutter which measures 8.0 x  2.2 x 8.3 cm. Possible abscess. Decreasing pelvic free fluid. There is a surgical drain in the dependent pelvis. No additional rim enhancing fluid collections identified at this time. There is new loculated fluid along the uterus, endometrium measuring 4.4 by 2.9 by 2.4 cm. Please correlate for the etiology of this fluid. This is possible distal area of infection. With the appearance and the other findings please correlate for any possibility of a fistula. Significant fold and wall thickening along the stomach. Please correlate with any symptoms. The stomach is underdistended. Decreasing pleural effusion adjacent lung opacities with some residual. Recommend continued follow-up. Atelectasis versus infiltrate. Electronically Signed   By: Ranell Bring M.D.   On: 03/22/2024 13:59       Elgie Butter M.D. [name] 03/23/2024, 4:03 PM  Available via Epic secure chat 7am-7pm After 7 pm, please refer to night coverage provider listed on amion.

## 2024-03-23 NOTE — Plan of Care (Signed)
  Problem: Clinical Measurements: Goal: Ability to maintain clinical measurements within normal limits will improve Outcome: Progressing Goal: Diagnostic test results will improve Outcome: Progressing   Problem: Pain Managment: Goal: General experience of comfort will improve and/or be controlled Outcome: Progressing

## 2024-03-23 NOTE — Plan of Care (Signed)
  Problem: Coping: Goal: Level of anxiety will decrease Outcome: Progressing   Problem: Pain Managment: Goal: General experience of comfort will improve and/or be controlled Outcome: Progressing   Problem: Clinical Measurements: Goal: Will remain free from infection 03/23/2024 0641 by Arthur Angry, RN Outcome: Not Progressing 03/23/2024 0640 by Arthur Angry, RN Outcome: Progressing

## 2024-03-23 NOTE — Plan of Care (Signed)
  Problem: Clinical Measurements: Goal: Ability to maintain clinical measurements within normal limits will improve Outcome: Progressing Goal: Will remain free from infection Outcome: Progressing Goal: Diagnostic test results will improve Outcome: Progressing Goal: Respiratory complications will improve Outcome: Progressing Goal: Cardiovascular complication will be avoided Outcome: Progressing   Problem: Activity: Goal: Risk for activity intolerance will decrease Outcome: Progressing   Problem: Elimination: Goal: Will not experience complications related to bowel motility Outcome: Progressing Goal: Will not experience complications related to urinary retention Outcome: Progressing   Problem: Coping: Goal: Level of anxiety will decrease Outcome: Progressing   Problem: Safety: Goal: Ability to remain free from injury will improve Outcome: Progressing   Problem: Pain Managment: Goal: General experience of comfort will improve and/or be controlled Outcome: Progressing   Problem: Skin Integrity: Goal: Risk for impaired skin integrity will decrease Outcome: Progressing   Problem: Education: Goal: Ability to describe self-care measures that may prevent or decrease complications (Diabetes Survival Skills Education) will improve Outcome: Progressing Goal: Individualized Educational Video(s) Outcome: Progressing   Problem: Fluid Volume: Goal: Ability to maintain a balanced intake and output will improve Outcome: Progressing   Problem: Health Behavior/Discharge Planning: Goal: Ability to identify and utilize available resources and services will improve Outcome: Progressing Goal: Ability to manage health-related needs will improve Outcome: Progressing   Problem: Skin Integrity: Goal: Risk for impaired skin integrity will decrease Outcome: Progressing   Problem: Respiratory: Goal: Ability to maintain a clear airway and adequate ventilation will improve Outcome:  Progressing   Problem: Activity: Goal: Ability to tolerate increased activity will improve Outcome: Progressing

## 2024-03-23 NOTE — Progress Notes (Signed)
 Occupational Therapy Treatment Patient Details Name: Alexandria Durham MRN: [id number] DOB: May 17, 1946 Today's Date: 03/23/2024   History of present illness Patient is a 78 year old female who presented on 03/01/2024 following a D and C on 7/1 for thickened endometrium and procedure complicated due to bowel perforation. Pt developed signs and symptoms of sepsis and shock. 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  Pt required post op ventilation and extubated on 7/4.  Pt PMH includes but is not limited to:  recent hospital admissions for diverticulitis, pancreatitis, abdominal pain, D&C  01/06/24 with uterine perforation, cholecystectomy and d&C on 01/17/24, DM II, HTN, COPD, GERD and CKD III.   OT comments  The pt presented with slightly improved ability to perform supine to sit today. Once seated EOB, she required intermittent assist and cues to correct posterior lean, due to abdominal guarding secondary to pain. She presented with decreased standing tolerance and poor balance, after sit to stand using a RW. She therefore needed assist to transfer to the chair with [facility] device. Total assist required for sock management in sitting. Continue OT plan of care. Patient will benefit from continued inpatient follow up therapy, <3 hours/day.       If plan is discharge home, recommend the following:  Assistance with cooking/housework;Assist for transportation;Help with stairs or ramp for entrance;A lot of help with bathing/dressing/bathroom;A lot of help with walking and/or transfers   Equipment Recommendations  Other (comment) (to be determined pending progress at next level of care)    Recommendations for Other Services      Precautions / Restrictions Precautions Precautions: Fall Other Brace: abdominal binder when out of bed Restrictions Other Position/Activity Restrictions: wound vac, JP drain, ostomy       Mobility Bed Mobility Overal bed mobility: Needs Assistance Bed  Mobility: Supine to Sit     Supine to sit: Mod assist, Used rails, HOB elevated          Transfers Overall transfer level: Needs assistance   Transfers: Sit to/from Stand, Bed to chair/wheelchair/BSC Sit to Stand: Max assist           General transfer comment: Initial stand was with RW, with pt needing max assist and increased cues for pushing with BLE and trunk extension in standing; pt presented wiyh poor standing balance and tolerance and therefore needed max assist to stand using Stedy, with subsequent transfer to the bedside chair Transfer via Lift Equipment: Stedy   Balance     Sitting balance-Leahy Scale: Poor       Standing balance-Leahy Scale: Poor           ADL either performed or assessed with clinical judgement   ADL        Upper Body Dressing : Moderate assistance;Sitting   Lower Body Dressing: Total assistance;Sitting/lateral leans Lower Body Dressing Details (indicate cue type and reason): to donn socks seated EOB                              Communication Communication Communication: No apparent difficulties   Cognition Arousal: Alert Behavior During Therapy: Anxious           Attention impairment (select first level of impairment): Divided attention Executive functioning impairment (select all impairments): Problem solving                     Following commands impaired: Follows one step commands with increased time  Cueing   Cueing Techniques: Verbal cues, Gestural cues, Tactile cues             Pertinent Vitals/ Pain       Pain Assessment Pain Assessment: 0-10 Pain Score: 10-Worst pain ever Pain Location: abdomen Pain Descriptors / Indicators: Grimacing, Guarding Pain Intervention(s): Limited activity within patient's tolerance, Monitored during session, Repositioned         Frequency  Min 2X/week        Progress Toward Goals  OT Goals(current goals can now be found in the care plan  section)     Acute Rehab OT Goals Patient Stated Goal: decreased pain and to get better OT Goal Formulation: With patient Time For Goal Achievement: 04/05/24 Potential to Achieve Goals: Good  Plan         AM-PAC OT 6 Clicks Daily Activity     Outcome Measure   Help from another person eating meals?: None Help from another person taking care of personal grooming?: A Little Help from another person toileting, which includes using toliet, bedpan, or urinal?: A Lot Help from another person bathing (including washing, rinsing, drying)?: A Lot Help from another person to put on and taking off regular upper body clothing?: A Lot Help from another person to put on and taking off regular lower body clothing?: Total 6 Click Score: 14    End of Session Equipment Utilized During Treatment: Other (comment);Gait belt (Stedy)  OT Visit Diagnosis: Unsteadiness on feet (R26.81);Muscle weakness (generalized) (M62.81);Pain;Other abnormalities of gait and mobility (R26.89) Pain - part of body:  (abdomen)   Activity Tolerance Patient limited by pain   Patient Left in chair;with call bell/phone within reach;with chair alarm set   Nurse Communication Mobility status        Time: 1433-1500 OT Time Calculation (min): 27 min  Charges: OT General Charges $OT Visit: 1 Visit OT Treatments $Therapeutic Activity: 23-37 mins    Delanna LITTIE Molt, OTR/L 03/23/2024, 7:38 PM

## 2024-03-23 NOTE — Progress Notes (Signed)
 22 Days Post-Op Procedure(s) (LRB): LAPAROTOMY, EXPLORATORY (N/A) SIGMOIDOSCOPY COLECTOMY, WITH COLOSTOMY CREATION MOBILIZATION, SPLENIC FLEXURE, WITH PARTIAL COLECTOMY CYSTOSCOPY  Subjective: Patient reports sleeping well last pm. Felt full after oral contrast. Threw up twice after. Agreeable with drain placement. Did not take in much yesterday, esp after receiving CT contrast. Denies dyspnea, chest pain. Left foot pain has improved. Sat on side of bed yesterday.   Objective: Vital signs in last 24 hours: Temp:  [98 F (36.7 C)-98.9 F (37.2 C)] 98.9 F (37.2 C) (07/24 0621) Pulse Rate:  [91-94] 93 (07/24 0621) Resp:  [16-18] 16 (07/24 0621) BP: (114-150)/(71-74) 150/73 (07/24 0621) SpO2:  [96 %-100 %] 97 % (07/24 0621) Last BM Date : 03/22/24  Intake/Output from previous day: 07/23 0701 - 07/24 0700 In: 830 [P.O.:120; I.V.:10; IV Piggyback:700] Out: 1895 [Urine:1600; Drains:45; Stool:250]  Physical Examination: General: Alert, oriented, no acute distress, in bed Respiratory: Normal work of breathing. Lungs clear to auscultation bilaterally without rhonchi. Cardiovascular: Regular sinus rhythm and rate, no murmurs or rubs. Abdomen: soft, mildly rounded, + BS, tender to palpation diffusely (stable). Incision: wound VAC in place to suction- cloudy serosanguinous drainage in the canister. JP drain with minimal clear serous fluid. Dry dressing around drain. Ostomy with soft stool and flatus.    Extremities: extremities warm and well perfused, tenderness with movement of left LE improving Gu: Purewick in place with clear yellow urine  Labs:    Latest Ref Rng & Units 03/22/2024    8:12 AM 03/20/2024    8:58 AM 03/17/2024    7:11 AM  CBC  WBC 4.0 - 10.5 K/uL 10.1  8.9  9.0   Hemoglobin 12.0 - 15.0 g/dL 8.7  8.1  8.2   Hematocrit 36.0 - 46.0 % 27.5  25.4  25.1   Platelets 150 - 400 K/uL 584  535  495       Latest Ref Rng & Units 03/22/2024    8:12 AM 03/20/2024    5:25 AM  03/18/2024    6:12 AM  BMP  Glucose 70 - 99 mg/dL 842  850  840   BUN 8 - 23 mg/dL 18  22  23    Creatinine 0.44 - 1.00 mg/dL 9.07  9.22  9.13   Sodium 135 - 145 mmol/L 131  134  135   Potassium 3.5 - 5.1 mmol/L 4.5  4.1  4.1   Chloride 98 - 111 mmol/L 99  104  102   CO2 22 - 32 mmol/L 25  23  25    Calcium  8.9 - 10.3 mg/dL 9.1  8.2  8.5    Assessment: 78 y.o. s/p Procedure(s): LAPAROTOMY, EXPLORATORY SIGMOIDOSCOPY COLECTOMY, WITH COLOSTOMY CREATION MOBILIZATION, SPLENIC FLEXURE, WITH PARTIAL COLECTOMY CYSTOSCOPY.  No known gyn cancer diagnosis at this time. Endometrial tissue has not been obtained with D&C procedures performed due to thickened endometrium.  Post-Op: TPN discontinued 03/20/24 per Dr. [name]. Continue to monitor patient's intake. Supplemental shakes scheduled throughout the day. Overall working on post-op milestones. Having ostomy output. Encouraged increasing mobility with assist but patient hesitant given left foot pain. Excellent UOP. Continues to have decreased appetite-remeron  started 03/21/2024 with hopes to stimulate appetite. Minimal oral intake noted on I&O. New onset left foot pain over the weekend-doppler negative, hx gout, colchicine  ordered.   Per Dr. [name], when patient is discharged, JP drain can be removed and will need to have a wick placed in the opening to promote drainage.  CT AP performed 03/22/2024: -Surgical changes left-sided partial  colonic resection with diverting colostomy. There is along distal stump involving distal descending colon, rectosigmoid colon. This stump has significant wall thickening with stranding. Please correlate for area of colitis.   -More proximally the bowel is nondilated. Extensive colonic diverticula.   -Mild distension with some wall thickening along a loop of jejunum in the left mid abdomen. This abuts the presumed rim enhancing fluid and gas collection along the left pericolic gutter which measures 8.0 x 2.2 x 8.3 cm.  Possible abscess.   -Decreasing pelvic free fluid. There is a surgical drain in the dependent pelvis. No additional rim enhancing fluid collections identified at this time.   -There is new loculated fluid along the uterus, endometrium measuring 4.4 by 2.9 by 2.4 cm. Please correlate for the etiology of this fluid. This is possible distal area of infection. With the appearance and the other findings please correlate for any possibility of a fistula.   -Significant fold and wall thickening along the stomach. Please correlate with any symptoms. The stomach is underdistended.   -Decreasing pleural effusion adjacent lung opacities with some residual. Recommend continued follow-up. Atelectasis versus infiltrate.  Leukocytosis: WBC 10.1 yesterday am. Purulent drainage from around JP drain site noted on 03/09/24- culture with few/rare e coli and enterococcus - susceptibilities returned 03/13/24. Afebrile. With [facility] dressing change 03/13/24, purulent drainage noted coming from the anterior aspect of the incision-culture obtained-moderate e coli and moderate psuedomonas aeruginosa. Given sensitivities, abx changed to meropenem  on 03/16/24 and on zyvox  oral. CT AP performed 03/22/24-plan for IR drainage of fluid collection today. Per Dr. Viktoria, see if IR can remove existing JP drain.   Malnutrition: Albumin  1.7 on 03/20/24 am. Continue nutritional supplements. TPN discontinued 03/20/24.    Hyponatremia: asymptomatic, Na+131 yesterday am.   Acute on chronic anemia: asymptomatic, continue to monitor.   Type 2 diabetes mellitus: Continue insulin  sliding scale.  CBG (last 3)  Recent Labs    03/22/24 1724 03/23/24 0006 03/23/24 0624  GLUCAP 139* 112* 90      Prophylaxis: SCDs, on lovenox  (on hold for IR procedure)  Physical deconditioning: PT ordered. Up and out of bed as able. Plan for SNF when medically stable for discharge.    LOS: 23 days    Alexandria Durham 03/23/2024, 7:45 AM

## 2024-03-23 NOTE — Plan of Care (Signed)
   Problem: Clinical Measurements: Goal: Ability to maintain clinical measurements within normal limits will improve Outcome: Progressing Goal: Will remain free from infection Outcome: Progressing

## 2024-03-23 NOTE — Consult Note (Signed)
 WOC Nurse wound follow up Wound type: surgical  Measurement: 11cm x 4.5cm x 3.0cm Wound bed:  70% early granulation tissue/30% yellow slough in base Drainage (amount, consistency, odor) milky, blood tinged  Periwound: intact Dressing procedure/placement/frequency: Removed old NPWT dressing Someone changed after WOC nurse on 7/21 Removed 1pc of black foam and gauze packing  Images taken Cleansed wound with normal saline Filled wound with   _1___ piece of black foam  Used 1/2 ostomy barrier ring along the distal aspect of the wound edge and 1/4 piece in the umbilicus to aid in seal Sealed NPWT dressing at HG Patient received IV pain medication per bedside nurse prior to dressing change Patient tolerated procedure well; but she does yell out despite pain meds     WOC nurse will continue to provide NPWT dressing changed due to the complexity of the dressing change.       WOC Nurse ostomy follow up Stoma type/location: LUQ, colostomy  Stomal assessment/size: oval shaped, stoma is aprox <1 inch; it is visible now centrally. She has mucocutaneous necrosis circumferentially, but more on the right medial aspect of the stoma  Peristomal assessment: she has an open wound, full thickness just proximal to her stoma  Treatment options for stomal/peristomal skin: 2 barrier ring; may need ostomy powder on skin necrosis at some point if she has issues with leakage Output liquid brown with sour odor today Ostomy pouching: 2pc. Soft convex with 2 barrier ring Education provided: patient is not engaged to learn ostomy care will be completely dependent in ostomy care.  Enrolled patient in [facility] [employer] Discharge program: Yes  WOC nursing will follow along for support with ostomy and wound care.   Alexandria Durham [facility], CNS, CWON-AP [phone]

## 2024-03-23 NOTE — Procedures (Signed)
 Interventional Radiology Procedure Note  Procedure:  1.) Removal of surgical drain 2.) Placement of left paracolic gutter 30F drain yielding 10 mL purulent fluid  Complications: None  Estimated Blood Loss: None  Recommendations: - Cultures sent - Drain to JP bulb - Flush TID   Signed,  Wilkie LOIS Lent, MD

## 2024-03-24 DIAGNOSIS — N950 Postmenopausal bleeding: Secondary | ICD-10-CM | POA: Diagnosis not present

## 2024-03-24 DIAGNOSIS — E43 Unspecified severe protein-calorie malnutrition: Secondary | ICD-10-CM | POA: Diagnosis not present

## 2024-03-24 DIAGNOSIS — R1084 Generalized abdominal pain: Secondary | ICD-10-CM | POA: Diagnosis not present

## 2024-03-24 DIAGNOSIS — R1030 Lower abdominal pain, unspecified: Secondary | ICD-10-CM | POA: Diagnosis not present

## 2024-03-24 LAB — GLUCOSE, CAPILLARY
Glucose-Capillary: 109 mg/dL — ABNORMAL HIGH (ref 70–99)
Glucose-Capillary: 135 mg/dL — ABNORMAL HIGH (ref 70–99)
Glucose-Capillary: 140 mg/dL — ABNORMAL HIGH (ref 70–99)
Glucose-Capillary: 164 mg/dL — ABNORMAL HIGH (ref 70–99)

## 2024-03-24 LAB — CBC WITH DIFFERENTIAL/PLATELET
Abs Immature Granulocytes: 0.09 K/uL — ABNORMAL HIGH (ref 0.00–0.07)
Basophils Absolute: 0 K/uL (ref 0.0–0.1)
Basophils Relative: 0 %
Eosinophils Absolute: 0 K/uL (ref 0.0–0.5)
Eosinophils Relative: 0 %
HCT: 27.1 % — ABNORMAL LOW (ref 36.0–46.0)
Hemoglobin: 8.9 g/dL — ABNORMAL LOW (ref 12.0–15.0)
Immature Granulocytes: 1 %
Lymphocytes Relative: 15 %
Lymphs Abs: 1.5 K/uL (ref 0.7–4.0)
MCH: 30 pg (ref 26.0–34.0)
MCHC: 32.8 g/dL (ref 30.0–36.0)
MCV: 91.2 fL (ref 80.0–100.0)
Monocytes Absolute: 0.9 K/uL (ref 0.1–1.0)
Monocytes Relative: 9 %
Neutro Abs: 7.8 K/uL — ABNORMAL HIGH (ref 1.7–7.7)
Neutrophils Relative %: 75 %
Platelets: 605 K/uL — ABNORMAL HIGH (ref 150–400)
RBC: 2.97 MIL/uL — ABNORMAL LOW (ref 3.87–5.11)
RDW: 16.4 % — ABNORMAL HIGH (ref 11.5–15.5)
WBC: 10.4 K/uL (ref 4.0–10.5)
nRBC: 0 % (ref 0.0–0.2)

## 2024-03-24 LAB — BASIC METABOLIC PANEL WITH GFR
Anion gap: 9 (ref 5–15)
BUN: 16 mg/dL (ref 8–23)
CO2: 25 mmol/L (ref 22–32)
Calcium: 9.1 mg/dL (ref 8.9–10.3)
Chloride: 96 mmol/L — ABNORMAL LOW (ref 98–111)
Creatinine, Ser: 0.92 mg/dL (ref 0.44–1.00)
GFR, Estimated: >60 mL/min (ref >60)
Glucose, Bld: 96 mg/dL (ref 70–99)
Potassium: 4.2 mmol/L (ref 3.5–5.1)
Sodium: 130 mmol/L — ABNORMAL LOW (ref 135–145)

## 2024-03-24 MED ORDER — SODIUM CHLORIDE 0.9% FLUSH
5.0000 mL | Freq: Three times a day (TID) | INTRAVENOUS | Status: DC
  Administered 2024-03-24 – 2024-04-04 (×34): 5 mL

## 2024-03-24 NOTE — Progress Notes (Signed)
 Calorie Count Note   Calorie Count continued   Diet: Carb Modified Supplements: [employer] 1.4 TID   7/24: Breakfast: refused Lunch: refused Dinner: refused Supplements: 2 [employer] = 910 kcals, 40g protein   Total intake: 910 kcal (52% of minimum estimated needs)  40g protein (44% of minimum estimated needs)   Nutrition Dx: Severe Malnutrition related to acute illness as evidenced by energy intake < or equal to 50% for > or equal to 5 days, mild muscle depletion, percent weight loss (10% in 1.5 months).    Goal: Pt to meet >/= 90% of their estimated nutrition needs    Intervention:  -Consider Cortrak tube placement   -Recommend [employer] 1.4 ENT via tube at 55 ml/hr  -Provides 1848 kcals, 81g protein and 937 ml H2O   -Calorie Counts continue   -[employer] 1.4 PO TID, each provides 455 kcals and 20g protein   - Carb Modified diet per MD.             - Assistance with all meals to promote better intake.     Morna Lee, MS, RD, LDN Inpatient Clinical Dietitian Contact via Secure chat

## 2024-03-24 NOTE — Progress Notes (Signed)
 [name]                                                                               Shantese Raven, is a 78 y.o. female, DOB - Jul 04, 1946, FMW:[id number] Admit date - 02/29/2024    Outpatient Primary MD for the patient is [name], NP  LOS - 24  days    Brief summary   78 y.o.  F with PMH significant for thickened endometrium, HTN, Type 2 DM, CKD stage IIIa, recent pancreatitis and acute cholecystitis s/p lap chole and D&C in May of this year who was admitted 7/1 for hysteroscopy and repeat D&C.  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 Pt was acidotic post-procedure, so decision made to leave intubated, PCCM consulted in this setting.  Significant events as below.  Patient was eventually transferred under [facility] to [facility]age on 03/07/2024 while GYN oncology remains the primary service. Significant events Patient was admitted to the ICU postop intubated on the 7/2 extubated 7/ 4.  CT abd and pelvis on 7/23 showing new abscesses. IR consulted and she underwent Removal of surgical drain  And placement of left paracolic gutter 50F drain yielding 10 mL purulent fluid. Fluid to be sent for analysis.  No new complaints today. Recommended to ambulate with PT and encouraged oral intake.   Assessment & Plan    Assessment and Plan:  Left foot gouty attack On colchicine  0.6 mg BID. She reports left foot pain has improved. Encouraged her to ambulate and get out of bed.  Xrays of the foot ordered for further evaluation.  Ambulate as tolerated.    S/p D&C procedure complicated by bowel perforation S/p partial colectomy and ileostomy  Septic shock,  shock resolved  Presented for d&c on 7/1 c/b bowel perforation. OR 7/2 after development of septic shock, zosyn  started.  Had ex lap with sigmoidoscopy, partial colectomy  and ileostomy  placement.  CT AP 7/6 showing sigmoid diverticulitis, air-fluid level 4.6cm unable to discern abscess vs segment of colon.  Repeat CT abd and pelvis on 7/23 shows rim enhancing fluid and gas collection along the left pericolic gutter which measures 8.0 x 2.2 x 8.3 cm. Suspicious for abscess.  There is  also new loculated fluid along the uterus, endometrium measuring 4.4 by 2.9 by 2.4 cm. Evaluate for infection and possible fistula CT abd also shows thickened folds in the stomach, unclear etiology, similar presentation in June 5 th CT.  Culture from JP drain from 7/10  with E coli and Enterococcus , bacteroides. Patient is on meropenem  and zyvox .  IR consulted and she underwent Removal of surgical drain and placement of left paracolic gutter 50F drain yielding 10 mL purulent fluid ID on board and appreciate recommendations.   Pain control with dilaudid  1 mg every 2 hours prn, oxycodone  10 to 15 mg every 4 hours prn for moderate pain, and neurontin  300 mg TID.    Type 2 DM CBG (last 3)  Recent Labs    03/23/24 2346 03/24/24 0630 03/24/24 1216  GLUCAP 151* 109* 140*   Resume SSI. No changes in meds.    Hypertension Bp parameters are optimal.    Hypothyroidism Resume synthroid  75 mcg daily.    Hyperlipidemia Resume zetia .   Acute on stage 3a CKD Resolved..    Chest pain  Resolved.    Hyponatremia Sodium of 130.  Recheck in am.    Hyperkalemia Resolved with lokelma .    Anemia of blood loss from surgery.  Hemoglobin around 8.9 and stable.  monitor and transfuse to keep it greater than 7.    Thrombocytosis  Possibly from the infection.    RN Pressure Injury Documentation:    Malnutrition Type:  Nutrition Problem: Severe Malnutrition Etiology: acute illness   Malnutrition Characteristics:  Signs/Symptoms: energy intake < or equal to 50% for > or equal to 5 days, mild muscle depletion, percent weight loss (10% in 1.5 months) Percent weight loss: 10 %  (in 1.5 months)   Nutrition Interventions:  Interventions: Refer to RD note for recommendations  Estimated body mass index is 25.81 kg/m as calculated from the following:   Height as of this encounter: 5' 4 (1.626 m).   Weight as of this encounter: 68.2 kg.   Antimicrobials:   Anti-infectives (From admission, onward)    Start     Dose/Rate Route Frequency Ordered Stop   03/16/24 1230  meropenem  (MERREM ) 1 g in sodium chloride  0.9 % 100 mL IVPB        1 g 200 mL/hr over 30 Minutes Intravenous Every 8 hours 03/16/24 1134 03/29/24 2359   03/14/24 1000  linezolid  (ZYVOX ) tablet 600 mg        600 mg Oral Every 12 hours 03/14/24 0903 03/26/24 2359   03/14/24 0930  piperacillin -tazobactam (ZOSYN ) IVPB 3.375 g  Status:  Discontinued        3.375 g 12.5 mL/hr over 240 Minutes Intravenous Every 8 hours 03/14/24 0833 03/16/24 1134   03/13/24 1300  linezolid  (ZYVOX ) IVPB 600 mg  Status:  Discontinued        600 mg 300 mL/hr over 60 Minutes Intravenous Every 12 hours 03/13/24 1200 03/14/24 0903   03/13/24 1300  ceFEPIme  (MAXIPIME ) 2 g in sodium chloride  0.9 % 100 mL IVPB  Status:  Discontinued        2 g 200 mL/hr over 30 Minutes Intravenous Every 8 hours 03/13/24 1200 03/14/24 0833   03/10/24 1200  amoxicillin -clavulanate (AUGMENTIN ) 875-125 MG per tablet 1 tablet  Status:  Discontinued        1 tablet Oral Every 12 hours 03/10/24 1114 03/13/24 1200   03/01/24 1200  piperacillin -tazobactam (ZOSYN ) IVPB 3.375 g  Status:  Discontinued        3.375 g 12.5 mL/hr over 240 Minutes Intravenous Every 8 hours 03/01/24 0946 03/01/24 1116   03/01/24 1130  piperacillin -tazobactam (ZOSYN ) IVPB 3.375 g  Status:  Discontinued        3.375 g 12.5 mL/hr over 240 Minutes Intravenous Every 8 hours 03/01/24 1116 03/10/24 1114        Medications  Scheduled Meds:  buPROPion   150 mg Oral QPM   Chlorhexidine  Gluconate Cloth  6 each Topical Daily   colchicine   0.6 mg Oral BID   enoxaparin  (LOVENOX )  injection  40 mg Subcutaneous Q24H   ezetimibe   10 mg Oral Daily   feeding supplement (KATE [employer]MS STANDARD 1.4)  325 mL Oral TID BM   gabapentin   300 mg Oral TID   insulin  aspart  0-20 Units Subcutaneous  Q6H   levothyroxine   75 mcg Oral QAC breakfast   lidocaine   1 patch Transdermal Q24H   linagliptin   5 mg Oral Daily   linezolid   600 mg Oral Q12H   melatonin  3 mg Oral QHS   mirtazapine   7.5 mg Oral QHS   multivitamin with minerals  1 tablet Oral Daily   pantoprazole   40 mg Oral QHS   polyethylene glycol  17 g Oral Daily   sodium chloride  flush  10-40 mL Intracatheter Q12H   sodium chloride  flush  10-40 mL Intracatheter Q12H   sodium chloride  flush  5 mL Intracatheter Q8H   Continuous Infusions:  meropenem  (MERREM ) IV 1 g (03/24/24 1406)   PRN Meds:.hydrALAZINE , [DISCONTINUED]  HYDROmorphone  (DILAUDID ) injection **OR** HYDROmorphone  (DILAUDID ) injection, labetalol , methocarbamol , ondansetron  **OR** ondansetron  (ZOFRAN ) IV, mouth rinse, oxyCODONE , simethicone , sodium chloride  flush, sodium chloride  flush    Subjective:   Parker Sawatzky was seen and examined today. No new complaints today.   Objective:   Vitals:   03/23/24 1610 03/23/24 2130 03/24/24 0636 03/24/24 1256  BP: 107/66 (!) 143/86 116/65 (!) 156/68  Pulse: (!) 107 (!) 104 96 95  Resp: 12 18 18 16   Temp:  98.6 F (37 C) 98.6 F (37 C) 98.3 F (36.8 C)  TempSrc:  Oral  Oral  SpO2: 94% 100% 96% 98%  Weight:   68.2 kg   Height:        Intake/Output Summary (Last 24 hours) at 03/24/2024 1735 Last data filed at 03/24/2024 1712 Gross per 24 hour  Intake 802 ml  Output 1930 ml  Net -1128 ml   Filed Weights   03/18/24 0500 03/19/24 0500 03/24/24 0636  Weight: 76.3 kg 70.7 kg 68.2 kg     Exam General exam: Appears calm and comfortable  Respiratory system: Clear to auscultation. Respiratory effort normal. Cardiovascular system: S1 & S2 heard, RRR. No JVD,  Gastrointestinal system: Abdomen is soft, tenderness  present. Colostomy in place , drain in place with pus.  Central nervous system: Alert and oriented. No focal neurological deficits. Extremities: Symmetric 5 x 5 power. Skin: No rashes,  Psychiatry:  Mood & affect appropriate.        Data Reviewed:  I have personally reviewed following labs and imaging studies   CBC Lab Results  Component Value Date   WBC 10.4 03/24/2024   RBC 2.97 (L) 03/24/2024   HGB 8.9 (L) 03/24/2024   HCT 27.1 (L) 03/24/2024   MCV 91.2 03/24/2024   MCH 30.0 03/24/2024   PLT 605 (H) 03/24/2024   MCHC 32.8 03/24/2024   RDW 16.4 (H) 03/24/2024   LYMPHSABS 1.5 03/24/2024   MONOABS 0.9 03/24/2024   EOSABS 0.0 03/24/2024   BASOSABS 0.0 03/24/2024     Last metabolic panel Lab Results  Component Value Date   NA 130 (L) 03/24/2024   K 4.2 03/24/2024   CL 96 (L) 03/24/2024   CO2 25 03/24/2024   BUN 16 03/24/2024   CREATININE 0.92 03/24/2024   GLUCOSE 96 03/24/2024   GFRNONAA >60 03/24/2024   GFRAA 51 (L) 03/08/2016   CALCIUM  9.1 03/24/2024   PHOS 3.4 03/20/2024   PROT 6.0 (L) 03/20/2024   ALBUMIN  1.7 (L) 03/20/2024   BILITOT 0.6 03/20/2024   ALKPHOS 100 03/20/2024   AST 44 (H) 03/20/2024   ALT 68 (H) 03/20/2024   ANIONGAP 9 03/24/2024    CBG (last 3)  Recent Labs    03/23/24 2346 03/24/24 0630 03/24/24 1216  GLUCAP 151*  109* 140*      Coagulation Profile: No results for input(s): INR, PROTIME in the last 168 hours.   Radiology Studies: DG Foot Complete Left Result Date: 03/23/2024 EXAM: 3 or more VIEW(S) XRAY OF THE LEFT FOOT 03/23/2024 05:05:00 PM COMPARISON: None available. CLINICAL HISTORY: Left foot pain. Pt. Did not state injury. Pt. Did not want me to removed sock, best images attainable. FINDINGS: BONES AND JOINTS: No acute fracture. No focal osseous lesion. No joint dislocation. SOFT TISSUES: The soft tissues are unremarkable. IMPRESSION: 1. No significant abnormality. Electronically signed by: Pinkie Pebbles MD  03/23/2024 07:51 PM EDT RP Workstation: [id number]   CT GUIDED PERITONEAL/RETROPERITONEAL FLUID DRAIN BY PERC CATH Result Date: 03/23/2024 INDICATION: 78 year old female with postoperative abscess in the left paracolic gutter. She presents for image guided drain placement. Additionally, request is made for removal of her surgical drain while she is sedated. EXAM: CT-guided drain placement TECHNIQUE: Multidetector CT imaging of the abdomen was performed following the standard protocol without IV contrast. RADIATION DOSE REDUCTION: This exam was performed according to the departmental dose-optimization program which includes automated exposure control, adjustment of the mA and/or kV according to patient size and/or use of iterative reconstruction technique. MEDICATIONS: The patient is currently admitted to the hospital and receiving intravenous antibiotics. The antibiotics were administered within an appropriate time frame prior to the initiation of the procedure. ANESTHESIA/SEDATION: 2 mg Versed  administered for anxiolysis. COMPLICATIONS: None immediate. PROCEDURE: Informed written consent was obtained from the patient after a thorough discussion of the procedural risks, benefits and alternatives. All questions were addressed. Maximal Sterile Barrier Technique was utilized including caps, mask, sterile gowns, sterile gloves, sterile drape, hand hygiene and skin antiseptic. A timeout was performed prior to the initiation of the procedure. The retention suture to the existing surgical drain was released. The drain was removed by gentle manual traction. Next, a suitable skin entry site to target the left pericolic fluid and gas collection was identified and marked. Local anesthesia was attained by infiltration with 1% lidocaine . A small dermatotomy was made. Under intermittent CT guidance, a 15 cm 17 gauge introducer needle was carefully advanced into the collection. A 0.035 wire was coiled in the fluid collection.  The tract was dilated to 10 [place]. A skater 10 French drainage catheter was advanced over the wire and formed. Aspiration yields 10 mL thick purulent fluid. This was sent for Gram stain and culture. The drainage catheter was flushed and connected to JP bulb suction. The catheter was secured to the skin with 0 Prolene suture. Follow-up CT imaging demonstrates a well-positioned drainage catheter with significant decrease in volume of the abscess. The surgical drain was successfully removed. IMPRESSION: 1. Successful removal of surgical drainage catheter. 2. Successful placement of 10 French drain into the left lower quadrant fluid collection yielding 10 mL thick purulent fluid which was sent for Gram stain and culture. Electronically Signed   By: Wilkie Lent M.D.   On: 03/23/2024 16:42       Elgie Butter M.D. [name] 03/24/2024, 5:35 PM  Available via Epic secure chat 7am-7pm After 7 pm, please refer to night coverage provider listed on amion.

## 2024-03-24 NOTE — Progress Notes (Signed)
 Physical Therapy Treatment Patient Details Name: Alexandria Durham MRN: [id number] DOB: April 08, 1946 Today's Date: 03/24/2024   History of Present Illness Patient is a 78 year old female who presented on 03/01/2024 following a D and C on 7/1 for thickened endometrium and procedure complicated due to bowel perforation. Pt developed signs and symptoms of sepsis and shock. 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  Pt required post op ventilation and extubated on 7/4. Pt had removal of surgical drain and s/p left paracolic abscess drain placement on 03/23/24 by IR. Pt PMH includes but is not limited to:  recent hospital admissions for diverticulitis, pancreatitis, abdominal pain, D&C  01/06/24 with uterine perforation, cholecystectomy and d&C on 01/17/24, DM II, HTN, COPD, GERD and CKD III.    PT Comments  Pt requiring some encouragement to participate but eventually agreeable to at least stand a few times.  Pt assisted with standing x3 and performed pregait activity of marching in place (approx 10 sec with each stand).  Pt presents with some self limiting behaviors but once engaged in a task does tend to assist as able. Pt continues to require increased physical assist to mobilize so d/c plan remains appropriate.  Patient will benefit from continued inpatient follow up therapy, <3 hours/day.    If plan is discharge home, recommend the following: Two people to help with walking and/or transfers;A lot of help with bathing/dressing/bathroom;Assistance with cooking/housework;Assist for transportation   Can travel by private vehicle     No  Equipment Recommendations  Wheelchair cushion (measurements PT);Wheelchair (measurements PT);Hoyer lift;Hospital bed;BSC/3in1    Recommendations for Other Services       Precautions / Restrictions Precautions Precautions: Fall Precaution/Restrictions Comments: Abdominal sx - wound vac, JP drain, ostomy Other Brace: abd binder when out of bed (did not  see present in room today, 03/24/24)     Mobility  Bed Mobility Overal bed mobility: Needs Assistance Bed Mobility: Rolling, Sidelying to Sit, Sit to Sidelying Rolling: Mod assist, +2 for physical assistance Sidelying to sit: Max assist, HOB elevated, Used rails, +2 for physical assistance     Sit to sidelying: Used rails, Max assist, +2 for physical assistance General bed mobility comments: multimodal cues for log roll technique; requiring assist for upper and lower body at this time    Transfers Overall transfer level: Needs assistance Equipment used: Rolling walker (2 wheels) Transfers: Sit to/from Stand Sit to Stand: Mod assist, +2 physical assistance           General transfer comment: pt kept hands on RW, requested assist to rise, mod assist provided but pt was assisting (states more difficulty on left LE due to gout); performed x3 for strengthening and technique; also performed marching in place for approx 10 sec with each stand (very limited lifting of feet from floor but pt was able to perform weight shifting)    Ambulation/Gait                   Stairs             Wheelchair Mobility     Tilt Bed    Modified Rankin (Stroke Patients Only)       Balance Overall balance assessment: Needs assistance Sitting-balance support: No upper extremity supported, Feet supported Sitting balance-Leahy Scale: Fair     Standing balance support: Bilateral upper extremity supported, During functional activity, Reliant on assistive device for balance Standing balance-Leahy Scale: Poor  Communication Communication Communication: No apparent difficulties  Cognition Arousal: Alert Behavior During Therapy: Flat affect   PT - Cognitive impairments: No apparent impairments                       PT - Cognition Comments: limited verbalizations, requires motivation, always asks for assist instead of  initiating Following commands: Intact Following commands impaired: Follows one step commands with increased time    Cueing Cueing Techniques: Verbal cues, Gestural cues, Tactile cues  Exercises      General Comments        Pertinent Vitals/Pain Pain Assessment Pain Assessment: 0-10 Pain Score: 8  Pain Location: abdomen Pain Descriptors / Indicators: Grimacing, Guarding, Sore Pain Intervention(s): Repositioned, Monitored during session    Home Living                          Prior Function            PT Goals (current goals can now be found in the care plan section) Progress towards PT goals: Progressing toward goals    Frequency    Min 2X/week      PT Plan      Co-evaluation              AM-PAC PT 6 Clicks Mobility   Outcome Measure  Help needed turning from your back to your side while in a flat bed without using bedrails?: A Lot Help needed moving from lying on your back to sitting on the side of a flat bed without using bedrails?: A Lot Help needed moving to and from a bed to a chair (including a wheelchair)?: A Lot Help needed standing up from a chair using your arms (e.g., wheelchair or bedside chair)?: A Lot Help needed to walk in hospital room?: A Lot Help needed climbing 3-5 steps with a railing? : Total 6 Click Score: 11    End of Session Equipment Utilized During Treatment: Gait belt Activity Tolerance: Patient limited by fatigue Patient left: in bed;with call bell/phone within reach;with bed alarm set Nurse Communication: Mobility status PT Visit Diagnosis: Difficulty in walking, not elsewhere classified (R26.2);Muscle weakness (generalized) (M62.81)     Time: 8487-8459 PT Time Calculation (min) (ACUTE ONLY): 28 min  Charges:    $Therapeutic Activity: 23-37 mins PT General Charges $$ ACUTE PT VISIT: 1 Visit                    Tari PT, DPT Physical Therapist Acute Rehabilitation Services Office:  [phone]    Tari CROME [place] 03/24/2024, 4:36 PM

## 2024-03-24 NOTE — Plan of Care (Signed)
  Problem: Activity: Goal: Risk for activity intolerance will decrease Outcome: Progressing   Problem: Pain Managment: Goal: General experience of comfort will improve and/or be controlled Outcome: Progressing   Problem: Fluid Volume: Goal: Ability to maintain a balanced intake and output will improve Outcome: Progressing   Problem: Metabolic: Goal: Ability to maintain appropriate glucose levels will improve Outcome: Progressing   Problem: Nutritional: Goal: Maintenance of adequate nutrition will improve Outcome: Progressing

## 2024-03-24 NOTE — Progress Notes (Signed)
 Patient ID: Alexandria Durham, female   DOB: 1946-04-21, 78 y.o.   MRN: [id number]    Referring Physician(s): Tucker,K  Supervising Physician: Philip Cornet  Patient Status:  [facility] - In-pt  Chief Complaint: Abdominal pain, postop left abdominal abscess; s/p left paracolic abscess drain placement 03/23/24   Subjective: Pt cont to have some diffuse abd pain, poor appetite, flat affect; denies fever,N/V   Allergies: Penicillins, Ace inhibitors, Tape, Aspirin, and Codeine  Medications: Prior to Admission medications   Medication Sig Start Date End Date Taking? Authorizing Provider  bisacodyl  5 MG EC tablet Take 5 mg by mouth daily as needed for moderate constipation.   Yes [provider]  buPROPion  (WELLBUTRIN  XL) 150 MG 24 hr tablet Take 150 mg by mouth every evening.   Yes [provider]  Cholecalciferol  (VITAMIN D3) 25 MCG (1000 UT) CAPS Take 1,000 Units by mouth daily.   Yes [provider]  Cyanocobalamin  (VITAMIN B-12 PO) Take 1 tablet by mouth in the morning.   Yes [provider]  diphenhydramine -acetaminophen  (TYLENOL  PM) 25-500 MG TABS tablet Take 2 tablets by mouth at bedtime as needed (Sleep/Pain).   Yes [provider]  ezetimibe  (ZETIA ) 10 MG tablet Take 10 mg by mouth daily. 10/29/23 10/28/24 Yes [provider]  ferrous sulfate 325 (65 FE) MG tablet Take 325 mg by mouth daily with breakfast.   Yes [provider]  fluticasone  (FLONASE ) 50 MCG/ACT nasal spray Place 2 sprays into both nostrils daily as needed for rhinitis. 02/15/24  Yes Cheryle Page, MD  hydrochlorothiazide  (HYDRODIURIL ) 25 MG tablet Take 1 tablet (25 mg total) by mouth daily. 01/10/24  Yes Barbarann Nest, MD  levothyroxine  (SYNTHROID ) 75 MCG tablet Take 75 mcg by mouth daily before breakfast. 09/22/22  Yes [provider]  losartan  (COZAAR ) 50 MG tablet Take 50 mg by mouth in the morning.   Yes [provider]  magnesium  oxide  (MAG-OX) 400 MG tablet Take 400 mg by mouth daily. 03/01/17  Yes [provider]  methocarbamol  (ROBAXIN ) 500 MG tablet Take 1 tablet (500 mg total) by mouth 2 (two) times daily as needed for muscle spasms. 05/25/21  Yes Joldersma, Logan, PA-C  mometasone (ELOCON) 0.1 % cream Apply 1 Application topically daily as needed (Dermatitis- affected sites). 10/27/23  Yes [provider]  omeprazole (PRILOSEC) 40 MG capsule Take 40 mg by mouth daily before breakfast.   Yes [provider]  polycarbophil (FIBERCON) 625 MG tablet Take 1 tablet (625 mg total) by mouth 2 (two) times daily. 01/09/24  Yes Barbarann Nest, MD  polyethylene glycol powder (GLYCOLAX /MIRALAX ) 17 GM/SCOOP powder Take 17 g by mouth 2 (two) times daily as needed for moderate constipation. 08/15/15  Yes [provider]  sitaGLIPtin (JANUVIA) 50 MG tablet Take 50 mg by mouth daily. 10/28/22  Yes [provider]  triamcinolone ointment (KENALOG) 0.5 % Apply 1 Application topically 2 (two) times daily as needed (for itching).   Yes [provider]  TYLENOL  8 HOUR ARTHRITIS PAIN 650 MG CR tablet Take 650-1,300 mg by mouth every 8 (eight) hours as needed for pain.   Yes [provider]  ondansetron  (ZOFRAN ) 4 MG tablet Take 1 tablet (4 mg total) by mouth every 6 (six) hours as needed for nausea. 02/15/24   Cheryle Page, MD     Vital Signs: BP 116/65 (BP Location: Left Arm)   Pulse 96   Temp 98.6 F (37 C)   Resp 18   Ht  5' 4 (1.626 m)   Wt 150 lb 5.7 oz (68.2 kg)   SpO2 96%   BMI 25.81 kg/m   Physical Exam awake/answers questions ok, LLQ drain intact, insertion site mild to mod tender to palpation, OP 50 cc cream colored fluid; drain flushed without difficulty  Imaging: DG Foot Complete Left Result Date: 03/23/2024 EXAM: 3 or more VIEW(S) XRAY OF THE LEFT FOOT 03/23/2024 05:05:00 PM COMPARISON: None available. CLINICAL HISTORY: Left foot pain. Pt. Did not state injury. Pt.  Did not want me to removed sock, best images attainable. FINDINGS: BONES AND JOINTS: No acute fracture. No focal osseous lesion. No joint dislocation. SOFT TISSUES: The soft tissues are unremarkable. IMPRESSION: 1. No significant abnormality. Electronically signed by: Pinkie Pebbles MD 03/23/2024 07:51 PM EDT RP Workstation: [id number]   CT GUIDED PERITONEAL/RETROPERITONEAL FLUID DRAIN BY PERC CATH Result Date: 03/23/2024 INDICATION: 78 year old female with postoperative abscess in the left paracolic gutter. She presents for image guided drain placement. Additionally, request is made for removal of her surgical drain while she is sedated. EXAM: CT-guided drain placement TECHNIQUE: Multidetector CT imaging of the abdomen was performed following the standard protocol without IV contrast. RADIATION DOSE REDUCTION: This exam was performed according to the departmental dose-optimization program which includes automated exposure control, adjustment of the mA and/or kV according to patient size and/or use of iterative reconstruction technique. MEDICATIONS: The patient is currently admitted to the hospital and receiving intravenous antibiotics. The antibiotics were administered within an appropriate time frame prior to the initiation of the procedure. ANESTHESIA/SEDATION: 2 mg Versed  administered for anxiolysis. COMPLICATIONS: None immediate. PROCEDURE: Informed written consent was obtained from the patient after a thorough discussion of the procedural risks, benefits and alternatives. All questions were addressed. Maximal Sterile Barrier Technique was utilized including caps, mask, sterile gowns, sterile gloves, sterile drape, hand hygiene and skin antiseptic. A timeout was performed prior to the initiation of the procedure. The retention suture to the existing surgical drain was released. The drain was removed by gentle manual traction. Next, a suitable skin entry site to target the left pericolic fluid and gas  collection was identified and marked. Local anesthesia was attained by infiltration with 1% lidocaine . A small dermatotomy was made. Under intermittent CT guidance, a 15 cm 17 gauge introducer needle was carefully advanced into the collection. A 0.035 wire was coiled in the fluid collection. The tract was dilated to 10 [place]. A skater 10 French drainage catheter was advanced over the wire and formed. Aspiration yields 10 mL thick purulent fluid. This was sent for Gram stain and culture. The drainage catheter was flushed and connected to JP bulb suction. The catheter was secured to the skin with 0 Prolene suture. Follow-up CT imaging demonstrates a well-positioned drainage catheter with significant decrease in volume of the abscess. The surgical drain was successfully removed. IMPRESSION: 1. Successful removal of surgical drainage catheter. 2. Successful placement of 10 French drain into the left lower quadrant fluid collection yielding 10 mL thick purulent fluid which was sent for Gram stain and culture. Electronically Signed   By: Wilkie Lent M.D.   On: 03/23/2024 16:42   CT ABDOMEN PELVIS W CONTRAST Result Date: 03/22/2024 CLINICAL DATA:  Abdominal wall abscess. Open wound with purulent drainage. Wound VAC in place EXAM: CT ABDOMEN AND PELVIS WITH CONTRAST TECHNIQUE: Multidetector CT imaging of the abdomen and pelvis was performed using the standard protocol following bolus administration of intravenous contrast. RADIATION DOSE REDUCTION: This exam was performed according to the departmental  dose-optimization program which includes automated exposure control, adjustment of the mA and/or kV according to patient size and/or use of iterative reconstruction technique. CONTRAST:  OMNIPAQUE  IOHEXOL  300 MG/ML  SOLN COMPARISON:  Noncontrast CT chest abdomen pelvis 03/05/2024. FINDINGS: Lower chest: Trace bilateral pleural fluid, left greater than right. Adjacent opacities are again seen. The opacities in  the pleural effusions are decreasing from the prior CT scan. Again some residual. Catheter along the right side of the heart at the edge of the imaging field. Coronary artery calcifications are seen. Hepatobiliary: No focal liver abnormality is seen. Status post cholecystectomy. No biliary dilatation. Patent portal vein. Pancreas: Unremarkable. No pancreatic ductal dilatation or surrounding inflammatory changes. Spleen: Normal in size without focal abnormality. Adrenals/Urinary Tract: Adrenal glands are unremarkable. Only slight thickening of the left adrenal gland, stable. Kidneys are normal, without renal calculi, focal lesion, or hydronephrosis. Bladder is unremarkable. Stomach/Bowel: Underdistended stomach with significant wall and fold thickening. Please correlate with symptoms. Oral contrast was administered. Small bowel is nondilated except for short segment of proximal jejunum in left mid abdomen which also has some fold/wall thickening. Previous surgical resection with a diverting left-sided colostomy. The bowel proximal to the colostomy is nondilated with some luminal fluid and extensive diverticula. Normal appendix. There is a long stump extending from the mid descending colon. There is significant wall thickening of the stump with luminal fluid and stranding. Please correlate for colitis. Vascular/Lymphatic: Normal caliber aorta and IVC. Diffuse vascular calcifications. Retroaortic left renal vein. No discrete abnormal lymph node enlargement identified in the abdomen and pelvis. There is some small mesenteric nodes identified, not pathologic by size criteria. Reproductive: There is significant fluid along the endometrium of the uterus with some marginal presumed enhancement. This is new from. Please correlate for any history such as infection. No gas associated. Other: Anasarca. Diffuse mesenteric stranding and some dependent simple fluid. There is a loculated fluid collection along the left hemiabdomen  in the retroperitoneum and pericolic gutter region. This abuts the suture line of the cysts remaining stump. New from previous. This collection in the axial plane has dimension on image 39 of series 2 of 8.0 by 2.2 cm. Cephalocaudal length on sagittal image 80 of series 6 measures 8.3 cm. This abuts the lateral margin of the psoas muscle. There is some adjacent jejunal loops which have mild fold thickening. No additional dominant rim enhancing fluid collection. There is a surgical drain entering the left mid abdomen and extending caudal into the dependent pelvis. There is some stranding and gas along the anterior abdominal wall near midline just above the umbilicus. Please correlate for location of the draining collection. Musculoskeletal: Significant skin thickening along the anterior abdominal wall. Anasarca. Scattered degenerative changes. Trace anterolisthesis of L4 on L5. Significant degenerative changes particularly along the facet joints in the lumbar spine. Degenerative changes of the sacroiliac joints. There is significant streak artifact related to the patient's right hip hemiarthroplasty. The femoral component is not completely included in the imaging field. IMPRESSION: Surgical changes left-sided partial colonic resection with diverting colostomy. There is along distal stump involving distal descending colon, rectosigmoid colon. This stump has significant wall thickening with stranding. Please correlate for area of colitis. More proximally the bowel is nondilated. Extensive colonic diverticula. Mild distension with some wall thickening along a loop of jejunum in the left mid abdomen. This abuts the presumed rim enhancing fluid and gas collection along the left pericolic gutter which measures 8.0 x 2.2 x 8.3 cm. Possible abscess. Decreasing  pelvic free fluid. There is a surgical drain in the dependent pelvis. No additional rim enhancing fluid collections identified at this time. There is new loculated  fluid along the uterus, endometrium measuring 4.4 by 2.9 by 2.4 cm. Please correlate for the etiology of this fluid. This is possible distal area of infection. With the appearance and the other findings please correlate for any possibility of a fistula. Significant fold and wall thickening along the stomach. Please correlate with any symptoms. The stomach is underdistended. Decreasing pleural effusion adjacent lung opacities with some residual. Recommend continued follow-up. Atelectasis versus infiltrate. Electronically Signed   By: Ranell Bring M.D.   On: 03/22/2024 13:59    Labs:  CBC: Recent Labs    03/17/24 0711 03/20/24 0858 03/22/24 0812 03/24/24 0540  WBC 9.0 8.9 10.1 10.4  HGB 8.2* 8.1* 8.7* 8.9*  HCT 25.1* 25.4* 27.5* 27.1*  PLT 495* 535* 584* 605*    COAGS: No results for input(s): INR, APTT in the last 8760 hours.  BMP: Recent Labs    03/18/24 0612 03/20/24 0525 03/22/24 0812 03/24/24 0540  NA 135 134* 131* 130*  K 4.1 4.1 4.5 4.2  CL 102 104 99 96*  CO2 25 23 25 25   GLUCOSE 159* 149* 157* 96  BUN 23 22 18 16   CALCIUM  8.5* 8.2* 9.1 9.1  CREATININE 0.86 0.77 0.92 0.92  GFRNONAA >60 >60 >60 >60    LIVER FUNCTION TESTS: Recent Labs    03/13/24 0518 03/16/24 0438 03/17/24 0711 03/20/24 0525  BILITOT 0.5 0.7 0.5 0.6  AST 90* 48* 59* 44*  ALT 118* 86* 85* 68*  ALKPHOS 144* 126 129* 100  PROT 5.6* 6.3* 6.4* 6.0*  ALBUMIN  1.8* 1.8* 1.8* 1.7*    Assessment and Plan: 78 y.o. female with past medical history of arthritis, chronic kidney disease, COPD, diabetes, GERD, hyperlipidemia, hypertension, hypothyroidism, postmenopausal bleeding /thickened endometrium, recent pancreatitis and acute cholecystitis, status post lap chole and D&C in May of this year who was admitted on July 1 for hysteroscopy and repeat D&C.  The procedure was complicated by perforation of the uterus and injury to sigmoid colon/septic shock.  On 7/2 patient underwent exploratory lap,  descending end colostomy, partial colectomy.  She had a left lower quadrant surgical JP drain and wound VAC in place; now with post op left paracolic gutter abscess, s/p removal of previously existing left surgical drain and new LLQ drain placement 7/24; afebrile, WBC nl, hgb 8.9(8.7), creat nl, drain fl cx- pend  Drain Location: LLQ Size: Fr size: 10 Fr Date of placement: 03/23/24  Currently to: Drain collection device: suction bulb 24 hour output:  Output by Drain (mL) 03/22/24 0701 - 03/22/24 1900 03/22/24 1901 - 03/23/24 0700 03/23/24 0701 - 03/23/24 1900 03/23/24 1901 - 03/24/24 0700 03/24/24 0701 - 03/24/24 1226  Closed System Drain 1 LLQ 10 Fr. 10 10  50   Negative Pressure Wound Therapy Abdomen Medial;Mid 25          Current examination: Flushes/aspirates easily.  Insertion site unremarkable. Suture and stat lock in place. Dressed appropriately.   Plan: Continue TID flushes with 5 cc NS. Record output Q shift. Dressing changes QD or PRN if soiled.  Call IR APP or on call IR MD if difficulty flushing or sudden change in drain output.  Repeat imaging/possible drain injection once output < 10 mL/QD (excluding flush material). Consideration for drain removal if output is < 10 mL/QD (excluding flush material), pending discussion with the providing surgical  service.  Discharge planning: Please contact IR APP or on call IR MD prior to patient d/c to ensure appropriate follow up plans are in place. Typically patient will follow up with IR clinic 10-14 days post d/c for repeat imaging/possible drain injection. IR scheduler will contact patient with date/time of appointment. Patient will need to flush drain QD with 5 cc NS, record output QD, dressing changes every 2-3 days or earlier if soiled.   IR will continue to follow - please call with questions or concerns.      Electronically Signed: D. Franky Rakers, PA-C 03/24/2024, 12:17 PM   I spent a total of 15 Minutes at the the  patient's bedside AND on the patient's hospital floor or unit, greater than 50% of which was counseling/coordinating care for left abdominal abscess drain

## 2024-03-24 NOTE — Progress Notes (Signed)
 23 Days Post-Op Procedure(s) (LRB): LAPAROTOMY, EXPLORATORY (N/A) SIGMOIDOSCOPY COLECTOMY, WITH COLOSTOMY CREATION MOBILIZATION, SPLENIC FLEXURE, WITH PARTIAL COLECTOMY CYSTOSCOPY  Subjective: Patient reports doing ok this am. Has not eaten breakfast or had a supplemental shake. States she had two shakes yesterday. Shake from yesterday at 14:00 is still full on her tray table. Having intermittent abdominal pain. Mild soreness at drain insertion site. No nausea or emesis reported. States she got up to the chair yesterday. Has not ambulated longer distances.   Objective: Vital signs in last 24 hours: Temp:  [98.4 F (36.9 C)-98.6 F (37 C)] 98.6 F (37 C) (07/25 0636) Pulse Rate:  [96-107] 96 (07/25 0636) Resp:  [12-18] 18 (07/25 0636) BP: (107-143)/(58-86) 116/65 (07/25 0636) SpO2:  [94 %-100 %] 96 % (07/25 0636) Last BM Date : 03/23/24  Intake/Output from previous day: 07/24 0701 - 07/25 0700 In: 437 [P.O.:237; IV Piggyback:200] Out: 1550 [Urine:1400; Drains:50; Stool:100]  Physical Examination: General: sleeping, oriented when awake, no acute distress, in bed Respiratory: Normal work of breathing. Lungs clear to auscultation bilaterally without rhonchi. Cardiovascular: Regular sinus rhythm and rate, no murmurs or rubs. Abdomen: soft, mildly rounded, + BS, tender to palpation diffusely (stable). Incision: wound VAC in place to suction- cloudy serosanguinous drainage in the canister. JP drain (placed by IR on 03/23/24) with purulent (white to tan in color, cloudy) drainage. Dry dressing around drain. Ostomy with soft stool and flatus.    Extremities: extremities warm and well perfused, tenderness with movement of left LE improving Gu: Purewick in place with clear yellow urine  Labs:    Latest Ref Rng & Units 03/24/2024    5:40 AM 03/22/2024    8:12 AM 03/20/2024    8:58 AM  CBC  WBC 4.0 - 10.5 K/uL 10.4  10.1  8.9   Hemoglobin 12.0 - 15.0 g/dL 8.9  8.7  8.1   Hematocrit 36.0 -  46.0 % 27.1  27.5  25.4   Platelets 150 - 400 K/uL 605  584  535       Latest Ref Rng & Units 03/22/2024    8:12 AM 03/20/2024    5:25 AM 03/18/2024    6:12 AM  BMP  Glucose 70 - 99 mg/dL 842  850  840   BUN 8 - 23 mg/dL 18  22  23    Creatinine 0.44 - 1.00 mg/dL 9.07  9.22  9.13   Sodium 135 - 145 mmol/L 131  134  135   Potassium 3.5 - 5.1 mmol/L 4.5  4.1  4.1   Chloride 98 - 111 mmol/L 99  104  102   CO2 22 - 32 mmol/L 25  23  25    Calcium  8.9 - 10.3 mg/dL 9.1  8.2  8.5    Assessment: 78 y.o. s/p Procedure(s): LAPAROTOMY, EXPLORATORY SIGMOIDOSCOPY COLECTOMY, WITH COLOSTOMY CREATION MOBILIZATION, SPLENIC FLEXURE, WITH PARTIAL COLECTOMY CYSTOSCOPY.  No known gyn cancer diagnosis at this time. Endometrial tissue has not been obtained with D&C procedures performed due to thickened endometrium.  Post-Op: Continued poor PO intake discussed with patient by Dr. Viktoria. Advised patient of the possible need for a feeding tube to be placed if she cannot increase po intake. Pt states she will try to take in more. Minimal oral intake noted on I&O.  TPN discontinued 03/20/24 per Dr. [name]. Continue to monitor patient's intake. Supplemental shakes scheduled throughout the day. Overall working on post-op milestones. Having ostomy output. Encouraged increasing mobility with assist. Excellent UOP. Remeron  started 03/21/2024 with  hopes to stimulate appetite. Minimal oral intake noted on I&O. New onset left foot pain over the weekend-doppler negative, hx gout, colchicine  ordered. S/P IR drain placement on 03/23/24 in abscess-cultures pending.   CT AP performed 03/22/2024: -Surgical changes left-sided partial colonic resection with diverting colostomy. There is along distal stump involving distal descending colon, rectosigmoid colon. This stump has significant wall thickening with stranding. Please correlate for area of colitis.   -More proximally the bowel is nondilated. Extensive colonic diverticula.    -Mild distension with some wall thickening along a loop of jejunum in the left mid abdomen. This abuts the presumed rim enhancing fluid and gas collection along the left pericolic gutter which measures 8.0 x 2.2 x 8.3 cm. Possible abscess.   -Decreasing pelvic free fluid. There is a surgical drain in the dependent pelvis. No additional rim enhancing fluid collections identified at this time.   -There is new loculated fluid along the uterus, endometrium measuring 4.4 by 2.9 by 2.4 cm. Please correlate for the etiology of this fluid. This is possible distal area of infection. With the appearance and the other findings please correlate for any possibility of a fistula.   -Significant fold and wall thickening along the stomach. Please correlate with any symptoms. The stomach is underdistended.   -Decreasing pleural effusion adjacent lung opacities with some residual. Recommend continued follow-up. Atelectasis versus infiltrate.  Leukocytosis: WBC 10.4 this am. S/P IR drain placement in abscess on 03/23/24. Drain placed at the time of surgery removed per Dr. Viktoria during IR procedure. Purulent drainage from around JP drain (original drain placed during surgery) site noted on 03/09/24- culture with few/rare e coli and enterococcus - susceptibilities returned 03/13/24. Afebrile. With [facility] dressing change 03/13/24, purulent drainage noted coming from the anterior aspect of the incision-culture obtained-moderate e coli and moderate psuedomonas aeruginosa. Given sensitivities, abx changed to meropenem  on 03/16/24 and on zyvox  oral. CT AP performed 03/22/24.   Malnutrition: Albumin  1.7 on 03/20/24 am. Continue nutritional supplements. TPN discontinued 03/20/24. Dr. Viktoria discussed placement of feeding tube as a possibility if oral intake does not improve.    Hyponatremia: asymptomatic, Na+130 this am.   Acute on chronic anemia: asymptomatic, continue to monitor.   Type 2 diabetes mellitus: Continue insulin  sliding  scale.  CBG (last 3)  Recent Labs    03/23/24 1813 03/23/24 2346 03/24/24 0630  GLUCAP 85 151* 109*      Prophylaxis: SCDs, on lovenox  (on hold for IR procedure)  Physical deconditioning: PT ordered. Up and out of bed as able. Plan for SNF when medically stable for discharge. Will see if ambulation is possible today.   LOS: 24 days    Alexandria Durham 03/24/2024, 6:57 AM

## 2024-03-25 DIAGNOSIS — E43 Unspecified severe protein-calorie malnutrition: Secondary | ICD-10-CM | POA: Diagnosis not present

## 2024-03-25 DIAGNOSIS — R1084 Generalized abdominal pain: Secondary | ICD-10-CM | POA: Diagnosis not present

## 2024-03-25 DIAGNOSIS — R1030 Lower abdominal pain, unspecified: Secondary | ICD-10-CM | POA: Diagnosis not present

## 2024-03-25 DIAGNOSIS — N950 Postmenopausal bleeding: Secondary | ICD-10-CM | POA: Diagnosis not present

## 2024-03-25 LAB — BASIC METABOLIC PANEL WITH GFR
Anion gap: 10 (ref 5–15)
BUN: 17 mg/dL (ref 8–23)
CO2: 24 mmol/L (ref 22–32)
Calcium: 8.9 mg/dL (ref 8.9–10.3)
Chloride: 98 mmol/L (ref 98–111)
Creatinine, Ser: 0.97 mg/dL (ref 0.44–1.00)
GFR, Estimated: >60 mL/min (ref >60)
Glucose, Bld: 174 mg/dL — ABNORMAL HIGH (ref 70–99)
Potassium: 4.2 mmol/L (ref 3.5–5.1)
Sodium: 132 mmol/L — ABNORMAL LOW (ref 135–145)

## 2024-03-25 LAB — GLUCOSE, CAPILLARY
Glucose-Capillary: 106 mg/dL — ABNORMAL HIGH (ref 70–99)
Glucose-Capillary: 165 mg/dL — ABNORMAL HIGH (ref 70–99)
Glucose-Capillary: 110 mg/dL — ABNORMAL HIGH (ref 70–99)

## 2024-03-25 MED ORDER — SODIUM CHLORIDE 0.9 % IV SOLN
1.0000 g | Freq: Two times a day (BID) | INTRAVENOUS | Status: DC
  Administered 2024-03-25 – 2024-03-28 (×6): 1 g via INTRAVENOUS
  Filled 2024-03-25 (×6): qty 20

## 2024-03-25 NOTE — Plan of Care (Signed)
  Problem: Clinical Measurements: Goal: Diagnostic test results will improve Outcome: Progressing Goal: Respiratory complications will improve Outcome: Progressing   Problem: Coping: Goal: Level of anxiety will decrease Outcome: Progressing   Problem: Pain Managment: Goal: General experience of comfort will improve and/or be controlled Outcome: Progressing   Problem: Safety: Goal: Ability to remain free from injury will improve Outcome: Progressing   Problem: Skin Integrity: Goal: Risk for impaired skin integrity will decrease Outcome: Progressing   Problem: Education: Goal: Ability to describe self-care measures that may prevent or decrease complications (Diabetes Survival Skills Education) will improve Outcome: Progressing   Problem: Coping: Goal: Ability to adjust to condition or change in health will improve Outcome: Progressing   Problem: Fluid Volume: Goal: Ability to maintain a balanced intake and output will improve Outcome: Progressing   Problem: Health Behavior/Discharge Planning: Goal: Ability to identify and utilize available resources and services will improve Outcome: Progressing   Problem: Metabolic: Goal: Ability to maintain appropriate glucose levels will improve Outcome: Progressing   Problem: Nutritional: Goal: Maintenance of adequate nutrition will improve Outcome: Progressing Goal: Progress toward achieving an optimal weight will improve Outcome: Progressing

## 2024-03-25 NOTE — Progress Notes (Signed)
 PHARMACY NOTE:  ANTIMICROBIAL RENAL DOSAGE ADJUSTMENT  Current antimicrobial regimen includes a mismatch between antimicrobial dosage and estimated renal function. As per policy approved by the Pharmacy & Therapeutics and Medical Executive Committees, the antimicrobial dosage will be adjusted accordingly.  Current antimicrobial and dosage:  meropenem  1g q8 hr  Indication: IAI, pseudomonal infection  Renal Function:   Estimated Creatinine Clearance: 42.6 mL/min (by C-G formula based on SCr of 0.97 mg/dL). []      On intermittent HD, scheduled: []      On CRRT    Antimicrobial dosage has been changed to:  meropenem  1g IV q12 hr   Additional Comments: CrCl had been borderline, but now continues to decline; will adjust for worsening renal function   Thank you for allowing pharmacy to be a part of this patient's care.  Bard Jeans, PharmD, BCPS [phone] 03/25/2024, 2:43 PM

## 2024-03-25 NOTE — Progress Notes (Signed)
 Gyn Onc Progress Note  24 Days Post-Op Procedure(s) (LRB): LAPAROTOMY, EXPLORATORY (N/A) SIGMOIDOSCOPY COLECTOMY, WITH COLOSTOMY CREATION MOBILIZATION, SPLENIC FLEXURE, WITH PARTIAL COLECTOMY CYSTOSCOPY  Subjective: Patient reports doing okay.  Abdominal pain is stable.  Has more pain with movement.  Was able to stand with PT yesterday.  Spent some time in chair.  Tells me that she had 2 nutritional shakes yesterday, some eggs, and some green beans.  Not feeling hungry.  Discomfort on anterior surface of left foot improving.  Objective: Vital signs in last 24 hours: Temp:  [97.8 F (36.6 C)-98.3 F (36.8 C)] 98.3 F (36.8 C) (07/26 0609) Pulse Rate:  [90-95] 90 (07/26 0609) Resp:  [16-18] 18 (07/26 0609) BP: (138-156)/(68-89) 147/75 (07/26 0609) SpO2:  [95 %-98 %] 96 % (07/26 0609) Weight:  [143 lb 4.8 oz (65 kg)] 143 lb 4.8 oz (65 kg) (07/26 0609) Last BM Date : 03/24/24  Intake/Output from previous day: 07/25 0701 - 07/26 0700 In: 585 [P.O.:480; I.V.:5; IV Piggyback:100] Out: 1440 [Urine:1250; Drains:90; Stool:100]  Physical Examination: General: sleeping, oriented when awake, no acute distress, in bed Respiratory: Normal work of breathing. Lungs clear to auscultation bilaterally without rhonchi. Cardiovascular: Regular sinus rhythm and rate, no murmurs or rubs. Abdomen: soft, mildly distended, + BS, tender to palpation diffusely (stable). Incision: wound VAC in place to suction- cloudy serosanguinous drainage in the canister. Left paracolic gutter drain with purulent  drainage. Dry dressing around drain. Ostomy viable, bag with 50 cc soft stool, gas.    Extremities: extremities warm and well perfused, tenderness with movement of left LE improving Gu: Purewick in place with clear yellow urine  Labs:    Latest Ref Rng & Units 03/24/2024    5:40 AM 03/22/2024    8:12 AM 03/20/2024    8:58 AM  CBC  WBC 4.0 - 10.5 K/uL 10.4  10.1  8.9   Hemoglobin 12.0 - 15.0 g/dL 8.9  8.7   8.1   Hematocrit 36.0 - 46.0 % 27.1  27.5  25.4   Platelets 150 - 400 K/uL 605  584  535       Latest Ref Rng & Units 03/24/2024    5:40 AM 03/22/2024    8:12 AM 03/20/2024    5:25 AM  BMP  Glucose 70 - 99 mg/dL 96  842  850   BUN 8 - 23 mg/dL 16  18  22    Creatinine 0.44 - 1.00 mg/dL 9.07  9.07  9.22   Sodium 135 - 145 mmol/L 130  131  134   Potassium 3.5 - 5.1 mmol/L 4.2  4.5  4.1   Chloride 98 - 111 mmol/L 96  99  104   CO2 22 - 32 mmol/L 25  25  23    Calcium  8.9 - 10.3 mg/dL 9.1  9.1  8.2    Assessment:  78 y.o. s/p Procedure(s): LAPAROTOMY, EXPLORATORY SIGMOIDOSCOPY COLECTOMY, WITH COLOSTOMY CREATION MOBILIZATION, SPLENIC FLEXURE, WITH PARTIAL COLECTOMY CYSTOSCOPY: working on increasing PO intake, physical conditioning  Post-op: - continue to work on being up to chair, out of bed with assistance. PT/OT - poor PO intake since TPN discontinued on 7/21. Drinking 1-2 nutrition shakes daily, yesterday had some solid food. Calorie count imperative. Spoke with the patient yesterday and again today recommendation for tube feeds if unable to increase PO intake. - continue Remeron  - having bowel function although low output in setting of poor PO intake - excellent UOP with purewick in place  Intra-abdominal infection: 7/10 culture  with e coli and enterococcus 7/14 incision culture with e coli and pseudomonas  Current antibiotic regimen: linezolid  (7/14) and meropenem  (7/17) - CT on 7/23 with 8 cm left para-colic gutter fluid collection concerning for abscess. IR drain placement on 7/24 (removed JP from surgery at the time of this drain placement) - gram stain with rare gram + cocci in pairs, culture pending - ID following. Appreciate their recommendations. Will review any need for change in antibiotic plan when newest culture and sensitivities available - leukocytosis resolved  Severe calorie-deficient nutrition: Patient voices some frustration in expectations for her p.o.  intake.  She is trying to eat more even though not feeling hungry.  Encouragement given today. - continue TID supplements - continue strict calorie counts - if unable to increase PO intake over next 1-2 days, revisit recommendation for tube feeds - message sent to RN to chart quantity of food and supplements consumed each shift  Hyponatremia: - asymptomatic, stable  Acute on chronic anemia: - asymptomatic, continue to monitor  Thickened endometrium: - failed endometrial sampling on multiple attempts (hysteroscopic and under intra-abdominal visualization) - no definitive malignancy or hyperplasia diagnosis  Type 2 diabetes mellitus:  - CBGs 106-164 in last 24 hours - continue insulin  sliding scale   Prophylaxis:  - SCDs, lovenox    LOS: 25 days    Comer JONELLE Dollar 03/25/2024, 7:54 AM

## 2024-03-25 NOTE — Plan of Care (Signed)
  Problem: Clinical Measurements: Goal: Ability to maintain clinical measurements within normal limits will improve Outcome: Progressing Goal: Will remain free from infection Outcome: Progressing Goal: Diagnostic test results will improve Outcome: Progressing Goal: Respiratory complications will improve Outcome: Progressing Goal: Cardiovascular complication will be avoided Outcome: Progressing   Problem: Activity: Goal: Risk for activity intolerance will decrease Outcome: Progressing   Problem: Coping: Goal: Level of anxiety will decrease Outcome: Progressing   Problem: Elimination: Goal: Will not experience complications related to bowel motility Outcome: Progressing Goal: Will not experience complications related to urinary retention Outcome: Progressing   Problem: Pain Managment: Goal: General experience of comfort will improve and/or be controlled Outcome: Progressing   Problem: Safety: Goal: Ability to remain free from injury will improve Outcome: Progressing   Problem: Skin Integrity: Goal: Risk for impaired skin integrity will decrease Outcome: Progressing   Problem: Education: Goal: Ability to describe self-care measures that may prevent or decrease complications (Diabetes Survival Skills Education) will improve Outcome: Progressing Goal: Individualized Educational Video(s) Outcome: Progressing   Problem: Coping: Goal: Ability to adjust to condition or change in health will improve Outcome: Progressing   Problem: Fluid Volume: Goal: Ability to maintain a balanced intake and output will improve Outcome: Progressing   Problem: Health Behavior/Discharge Planning: Goal: Ability to identify and utilize available resources and services will improve Outcome: Progressing Goal: Ability to manage health-related needs will improve Outcome: Progressing   Problem: Metabolic: Goal: Ability to maintain appropriate glucose levels will improve Outcome: Progressing    Problem: Nutritional: Goal: Maintenance of adequate nutrition will improve Outcome: Progressing Goal: Progress toward achieving an optimal weight will improve Outcome: Progressing   Problem: Skin Integrity: Goal: Risk for impaired skin integrity will decrease Outcome: Progressing   Problem: Tissue Perfusion: Goal: Adequacy of tissue perfusion will improve Outcome: Progressing   Problem: Education: Goal: Knowledge of the prescribed therapeutic regimen will improve Outcome: Progressing Goal: Understanding of sexual limitations or changes related to disease process or condition will improve Outcome: Progressing Goal: Individualized Educational Video(s) Outcome: Progressing   Problem: Self-Concept: Goal: Communication of feelings regarding changes in body function or appearance will improve Outcome: Progressing   Problem: Skin Integrity: Goal: Demonstration of wound healing without infection will improve Outcome: Progressing   Problem: Activity: Goal: Ability to tolerate increased activity will improve Outcome: Progressing   Problem: Respiratory: Goal: Ability to maintain a clear airway and adequate ventilation will improve Outcome: Progressing   Problem: Role Relationship: Goal: Method of communication will improve Outcome: Progressing

## 2024-03-25 NOTE — Progress Notes (Signed)
 Mobility Specialist - Progress Note   03/25/24 1401  Mobility  Activity Stood at bedside  Level of Assistance Minimal assist, patient does 75% or more  Assistive Device Front wheel walker  Range of Motion/Exercises Active Assistive  Activity Response Tolerated well  Mobility Referral Yes  Mobility visit 1 Mobility  Mobility Specialist Start Time (ACUTE ONLY) 1345  Mobility Specialist Stop Time (ACUTE ONLY) 1401  Mobility Specialist Time Calculation (min) (ACUTE ONLY) 16 min   Pt was found in bed and agreeable to mobilize. Pt able to sit EOB and perform the exercises below. Afterwards pt able to do x1 STS. Grew fatigued with session and at EOS returned to bed with all needs met. Call bell in reach and bed alarm on. RN notified.   Erminio Leos,  Mobility Specialist Can be reached via Secure Chat

## 2024-03-25 NOTE — Progress Notes (Signed)
 [name]                                                                               Alyn Riedinger, is a 78 y.o. female, DOB - 06-22-1946, FMW:[id number] Admit date - 02/29/2024    Outpatient Primary MD for the patient is [name], NP  LOS - 25  days    Brief summary   78 y.o.  F with PMH significant for thickened endometrium, HTN, Type 2 DM, CKD stage IIIa, recent pancreatitis and acute cholecystitis s/p lap chole and D&C in May of this year who was admitted 7/1 for hysteroscopy and repeat D&C.  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 Pt was acidotic post-procedure, so decision made to leave intubated, PCCM consulted in this setting.  Significant events as below.  Patient was eventually transferred under [facility] to comanage on 03/07/2024 while GYN oncology remains the primary service. Significant events Patient was admitted to the ICU postop intubated on the 7/2 extubated 7/ 4.  CT abd and pelvis on 7/23 showing new abscesses. IR consulted and she underwent Removal of surgical drain  And placement of left paracolic gutter 58F drain yielding 10 mL purulent fluid. Fluid to be sent for analysis.  No new complaints today. Recommended to ambulate with PT and encouraged oral intake.   Assessment & Plan    Assessment and Plan:  Left foot gouty attack On colchicine  0.6 mg BID. She reports left foot pain has improved. Encouraged her to ambulate and get out of bed.  Xrays of the foot ordered for further evaluation. They have been negative. No soft tissue swelling. Pain has completed resolved.  Ambulate as tolerated.    S/p D&C procedure complicated by bowel perforation S/p partial colectomy and ileostomy  Septic shock,  shock resolved  Presented for d&c on 7/1 c/b bowel perforation. OR 7/2 after development of septic shock, zosyn   started.  Had ex lap with sigmoidoscopy, partial colectomy  and ileostomy placement.  CT AP 7/6 showing sigmoid diverticulitis, air-fluid level 4.6cm unable to discern abscess vs segment of colon.  Repeat CT abd and pelvis on 7/23 shows rim enhancing fluid and gas collection along the left pericolic gutter which measures 8.0 x 2.2 x 8.3 cm. Suspicious for abscess.  There is  also new loculated fluid along the uterus, endometrium measuring 4.4 by 2.9 by 2.4 cm. Evaluate for infection and possible fistula CT abd also shows thickened folds in the stomach, unclear etiology, similar presentation in June 5 th CT.  Culture from JP drain from 7/10  with E coli and Enterococcus , bacteroides. Patient is on meropenem  and zyvox .  IR consulted and she underwent Removal of surgical drain and placement of left paracolic gutter 58F drain yielding 10 mL purulent fluid ID on board and appreciate recommendations.   Pain control with dilaudid  1 mg every 2 hours prn, oxycodone  10 to 15 mg every 4 hours prn for moderate pain, and neurontin  300 mg TID.    Type 2 DM CBG (last 3)  Recent Labs    03/24/24 2355 03/25/24 0606 03/25/24 1217  GLUCAP 135* 106* 165*   Resume SSI. No changes in meds.  Nutrition on board.  Encourage oral intake.    Hypertension Bp parameters are well controlled.    Hypothyroidism Resume synthroid  75 mcg daily.    Hyperlipidemia Resume zetia .   Acute on stage 3a CKD Resolved..    Chest pain  Resolved.    Mild hyponatremia: Sodium ranging between 130 to 132.  She remains asymptomatic.    Hyperkalemia Resolved with lokelma .    Anemia of blood loss from surgery.  Hemoglobin around 8.9 and stable.  monitor and transfuse to keep it greater than 7.    Thrombocytosis  Possibly from the infection.    RN Pressure Injury Documentation:    Malnutrition Type:  Nutrition Problem: Severe Malnutrition Etiology: acute illness   Malnutrition  Characteristics:  Signs/Symptoms: energy intake < or equal to 50% for > or equal to 5 days, mild muscle depletion, percent weight loss (10% in 1.5 months) Percent weight loss: 10 % (in 1.5 months)   Nutrition Interventions:  Interventions: Refer to RD note for recommendations  Estimated body mass index is 24.6 kg/m as calculated from the following:   Height as of this encounter: 5' 4 (1.626 m).   Weight as of this encounter: 65 kg.   Antimicrobials:   Anti-infectives (From admission, onward)    Start     Dose/Rate Route Frequency Ordered Stop   03/16/24 1230  meropenem  (MERREM ) 1 g in sodium chloride  0.9 % 100 mL IVPB        1 g 200 mL/hr over 30 Minutes Intravenous Every 8 hours 03/16/24 1134 03/29/24 2359   03/14/24 1000  linezolid  (ZYVOX ) tablet 600 mg        600 mg Oral Every 12 hours 03/14/24 0903 03/26/24 2359   03/14/24 0930  piperacillin -tazobactam (ZOSYN ) IVPB 3.375 g  Status:  Discontinued        3.375 g 12.5 mL/hr over 240 Minutes Intravenous Every 8 hours 03/14/24 0833 03/16/24 1134   03/13/24 1300  linezolid  (ZYVOX ) IVPB 600 mg  Status:  Discontinued        600 mg 300 mL/hr over 60 Minutes Intravenous Every 12 hours 03/13/24 1200 03/14/24 0903   03/13/24 1300  ceFEPIme  (MAXIPIME ) 2 g in sodium chloride  0.9 % 100 mL IVPB  Status:  Discontinued        2 g 200 mL/hr over 30 Minutes Intravenous Every 8 hours 03/13/24 1200 03/14/24 0833   03/10/24 1200  amoxicillin -clavulanate (AUGMENTIN ) 875-125 MG per tablet 1 tablet  Status:  Discontinued        1 tablet Oral Every 12 hours 03/10/24 1114 03/13/24 1200   03/01/24 1200  piperacillin -tazobactam (ZOSYN ) IVPB 3.375 g  Status:  Discontinued        3.375 g 12.5 mL/hr over 240 Minutes Intravenous Every 8 hours 03/01/24 0946 03/01/24 1116   03/01/24 1130  piperacillin -tazobactam (ZOSYN ) IVPB 3.375 g  Status:  Discontinued        3.375 g 12.5 mL/hr over 240 Minutes Intravenous Every 8 hours 03/01/24 1116 03/10/24 1114         Medications  Scheduled Meds:  buPROPion   150 mg Oral QPM   Chlorhexidine  Gluconate Cloth  6 each Topical Daily   colchicine   0.6 mg Oral BID   enoxaparin  (LOVENOX ) injection  40 mg Subcutaneous Q24H   ezetimibe   10 mg Oral Daily   feeding supplement (KATE  FARMS STANDARD 1.4)  325 mL Oral TID BM   gabapentin   300 mg Oral TID   insulin  aspart  0-20 Units Subcutaneous Q6H   levothyroxine   75 mcg Oral QAC breakfast   lidocaine   1 patch Transdermal Q24H   linagliptin   5 mg Oral Daily   linezolid   600 mg Oral Q12H   melatonin  3 mg Oral QHS   mirtazapine   7.5 mg Oral QHS   multivitamin with minerals  1 tablet Oral Daily   pantoprazole   40 mg Oral QHS   polyethylene glycol  17 g Oral Daily   sodium chloride  flush  10-40 mL Intracatheter Q12H   sodium chloride  flush  10-40 mL Intracatheter Q12H   sodium chloride  flush  5 mL Intracatheter Q8H   Continuous Infusions:  meropenem  (MERREM ) IV 1 g (03/25/24 0557)   PRN Meds:.hydrALAZINE , [DISCONTINUED]  HYDROmorphone  (DILAUDID ) injection **OR** HYDROmorphone  (DILAUDID ) injection, labetalol , methocarbamol , ondansetron  **OR** ondansetron  (ZOFRAN ) IV, mouth rinse, oxyCODONE , simethicone , sodium chloride  flush, sodium chloride  flush    Subjective:   Alvie Speltz was seen and examined today. Frustrated that we are asking her to increase her oral intake when she doesn't have much of an appetite.   Objective:   Vitals:   03/24/24 1256 03/24/24 2252 03/25/24 0609 03/25/24 1246  BP: (!) 156/68 138/89 (!) 147/75 139/71  Pulse: 95 93 90 95  Resp: 16 18 18 16   Temp: 98.3 F (36.8 C) 97.8 F (36.6 C) 98.3 F (36.8 C) 98.3 F (36.8 C)  TempSrc: Oral Oral Oral Oral  SpO2: 98% 95% 96% 95%  Weight:   65 kg   Height:        Intake/Output Summary (Last 24 hours) at 03/25/2024 1330 Last data filed at 03/25/2024 1210 Gross per 24 hour  Intake 365 ml  Output 1110 ml  Net -745 ml   Filed Weights   03/19/24 0500 03/24/24 0636 03/25/24  0609  Weight: 70.7 kg 68.2 kg 65 kg     Exam General exam: Appears calm and comfortable  Respiratory system: Clear to auscultation. Respiratory effort normal. Cardiovascular system: S1 & S2 heard, RRR. No JVD,  Gastrointestinal system: Abdomen is soft, moderate gen tenderness at the site of the drain. Colostomy in place with stool.  Central nervous system: Alert and oriented.  Extremities: no pedal edema.  Skin: No rashes,  Psychiatry: Mood & affect appropriate.         Data Reviewed:  I have personally reviewed following labs and imaging studies   CBC Lab Results  Component Value Date   WBC 10.4 03/24/2024   RBC 2.97 (L) 03/24/2024   HGB 8.9 (L) 03/24/2024   HCT 27.1 (L) 03/24/2024   MCV 91.2 03/24/2024   MCH 30.0 03/24/2024   PLT 605 (H) 03/24/2024   MCHC 32.8 03/24/2024   RDW 16.4 (H) 03/24/2024   LYMPHSABS 1.5 03/24/2024   MONOABS 0.9 03/24/2024   EOSABS 0.0 03/24/2024   BASOSABS 0.0 03/24/2024     Last metabolic panel Lab Results  Component Value Date   NA 132 (L) 03/25/2024   K 4.2 03/25/2024   CL 98 03/25/2024   CO2 24 03/25/2024   BUN 17 03/25/2024   CREATININE 0.97 03/25/2024   GLUCOSE 174 (H) 03/25/2024   GFRNONAA >60 03/25/2024   GFRAA 51 (L) 03/08/2016   CALCIUM  8.9 03/25/2024   PHOS 3.4 03/20/2024   PROT 6.0 (L) 03/20/2024   ALBUMIN  1.7 (L) 03/20/2024   BILITOT 0.6 03/20/2024   ALKPHOS  100 03/20/2024   AST 44 (H) 03/20/2024   ALT 68 (H) 03/20/2024   ANIONGAP 10 03/25/2024    CBG (last 3)  Recent Labs    03/24/24 2355 03/25/24 0606 03/25/24 1217  GLUCAP 135* 106* 165*      Coagulation Profile: No results for input(s): INR, PROTIME in the last 168 hours.   Radiology Studies: DG Foot Complete Left Result Date: 03/23/2024 EXAM: 3 or more VIEW(S) XRAY OF THE LEFT FOOT 03/23/2024 05:05:00 PM COMPARISON: None available. CLINICAL HISTORY: Left foot pain. Pt. Did not state injury. Pt. Did not want me to removed sock, best  images attainable. FINDINGS: BONES AND JOINTS: No acute fracture. No focal osseous lesion. No joint dislocation. SOFT TISSUES: The soft tissues are unremarkable. IMPRESSION: 1. No significant abnormality. Electronically signed by: Pinkie Pebbles MD 03/23/2024 07:51 PM EDT RP Workstation: [id number]   CT GUIDED PERITONEAL/RETROPERITONEAL FLUID DRAIN BY PERC CATH Result Date: 03/23/2024 INDICATION: 78 year old female with postoperative abscess in the left paracolic gutter. She presents for image guided drain placement. Additionally, request is made for removal of her surgical drain while she is sedated. EXAM: CT-guided drain placement TECHNIQUE: Multidetector CT imaging of the abdomen was performed following the standard protocol without IV contrast. RADIATION DOSE REDUCTION: This exam was performed according to the departmental dose-optimization program which includes automated exposure control, adjustment of the mA and/or kV according to patient size and/or use of iterative reconstruction technique. MEDICATIONS: The patient is currently admitted to the hospital and receiving intravenous antibiotics. The antibiotics were administered within an appropriate time frame prior to the initiation of the procedure. ANESTHESIA/SEDATION: 2 mg Versed  administered for anxiolysis. COMPLICATIONS: None immediate. PROCEDURE: Informed written consent was obtained from the patient after a thorough discussion of the procedural risks, benefits and alternatives. All questions were addressed. Maximal Sterile Barrier Technique was utilized including caps, mask, sterile gowns, sterile gloves, sterile drape, hand hygiene and skin antiseptic. A timeout was performed prior to the initiation of the procedure. The retention suture to the existing surgical drain was released. The drain was removed by gentle manual traction. Next, a suitable skin entry site to target the left pericolic fluid and gas collection was identified and marked. Local  anesthesia was attained by infiltration with 1% lidocaine . A small dermatotomy was made. Under intermittent CT guidance, a 15 cm 17 gauge introducer needle was carefully advanced into the collection. A 0.035 wire was coiled in the fluid collection. The tract was dilated to 10 [place]. A skater 10 French drainage catheter was advanced over the wire and formed. Aspiration yields 10 mL thick purulent fluid. This was sent for Gram stain and culture. The drainage catheter was flushed and connected to JP bulb suction. The catheter was secured to the skin with 0 Prolene suture. Follow-up CT imaging demonstrates a well-positioned drainage catheter with significant decrease in volume of the abscess. The surgical drain was successfully removed. IMPRESSION: 1. Successful removal of surgical drainage catheter. 2. Successful placement of 10 French drain into the left lower quadrant fluid collection yielding 10 mL thick purulent fluid which was sent for Gram stain and culture. Electronically Signed   By: Wilkie Lent M.D.   On: 03/23/2024 16:42       Elgie Butter M.D. [name] 03/25/2024, 1:30 PM  Available via Epic secure chat 7am-7pm After 7 pm, please refer to night coverage provider listed on amion.

## 2024-03-26 DIAGNOSIS — E43 Unspecified severe protein-calorie malnutrition: Secondary | ICD-10-CM | POA: Diagnosis not present

## 2024-03-26 DIAGNOSIS — R1030 Lower abdominal pain, unspecified: Secondary | ICD-10-CM | POA: Diagnosis not present

## 2024-03-26 DIAGNOSIS — N950 Postmenopausal bleeding: Secondary | ICD-10-CM | POA: Diagnosis not present

## 2024-03-26 DIAGNOSIS — R1084 Generalized abdominal pain: Secondary | ICD-10-CM | POA: Diagnosis not present

## 2024-03-26 LAB — AEROBIC/ANAEROBIC CULTURE W GRAM STAIN (SURGICAL/DEEP WOUND)

## 2024-03-26 LAB — CBC WITH DIFFERENTIAL/PLATELET
Abs Immature Granulocytes: 0.04 K/uL (ref 0.00–0.07)
Basophils Absolute: 0 K/uL (ref 0.0–0.1)
Basophils Relative: 0 %
Eosinophils Absolute: 0.1 K/uL (ref 0.0–0.5)
Eosinophils Relative: 1 %
HCT: 27.1 % — ABNORMAL LOW (ref 36.0–46.0)
Hemoglobin: 8.7 g/dL — ABNORMAL LOW (ref 12.0–15.0)
Immature Granulocytes: 1 %
Lymphocytes Relative: 24 %
Lymphs Abs: 1.7 K/uL (ref 0.7–4.0)
MCH: 29.6 pg (ref 26.0–34.0)
MCHC: 32.1 g/dL (ref 30.0–36.0)
MCV: 92.2 fL (ref 80.0–100.0)
Monocytes Absolute: 0.6 K/uL (ref 0.1–1.0)
Monocytes Relative: 8 %
Neutro Abs: 4.9 K/uL (ref 1.7–7.7)
Neutrophils Relative %: 66 %
Platelets: 601 K/uL — ABNORMAL HIGH (ref 150–400)
RBC: 2.94 MIL/uL — ABNORMAL LOW (ref 3.87–5.11)
RDW: 16.4 % — ABNORMAL HIGH (ref 11.5–15.5)
WBC: 7.4 K/uL (ref 4.0–10.5)
nRBC: 0 % (ref 0.0–0.2)

## 2024-03-26 LAB — COMPREHENSIVE METABOLIC PANEL WITH GFR
ALT: 41 U/L (ref 0–44)
AST: 31 U/L (ref 15–41)
Albumin: 2.1 g/dL — ABNORMAL LOW (ref 3.5–5.0)
Alkaline Phosphatase: 88 U/L (ref 38–126)
Anion gap: 10 (ref 5–15)
BUN: 15 mg/dL (ref 8–23)
CO2: 25 mmol/L (ref 22–32)
Calcium: 8.9 mg/dL (ref 8.9–10.3)
Chloride: 97 mmol/L — ABNORMAL LOW (ref 98–111)
Creatinine, Ser: 1.09 mg/dL — ABNORMAL HIGH (ref 0.44–1.00)
GFR, Estimated: 53 mL/min — ABNORMAL LOW (ref >60)
Glucose, Bld: 113 mg/dL — ABNORMAL HIGH (ref 70–99)
Potassium: 4.3 mmol/L (ref 3.5–5.1)
Sodium: 132 mmol/L — ABNORMAL LOW (ref 135–145)
Total Bilirubin: 0.3 mg/dL (ref 0.0–1.2)
Total Protein: 7 g/dL (ref 6.5–8.1)

## 2024-03-26 LAB — GLUCOSE, CAPILLARY
Glucose-Capillary: 113 mg/dL — ABNORMAL HIGH (ref 70–99)
Glucose-Capillary: 121 mg/dL — ABNORMAL HIGH (ref 70–99)
Glucose-Capillary: 132 mg/dL — ABNORMAL HIGH (ref 70–99)
Glucose-Capillary: 149 mg/dL — ABNORMAL HIGH (ref 70–99)
Glucose-Capillary: 96 mg/dL (ref 70–99)

## 2024-03-26 MED ORDER — LACTATED RINGERS IV SOLN
INTRAVENOUS | Status: AC
  Administered 2024-03-26: 1000 mL via INTRAVENOUS

## 2024-03-26 NOTE — Plan of Care (Signed)

## 2024-03-26 NOTE — Progress Notes (Signed)
 [name]                                                                               Alexandria Durham, is a 78 y.o. female, DOB - Nov 17, 1945, FMW:[id number] Admit date - 02/29/2024    Outpatient Primary MD for the patient is [name], NP  LOS - 26  days    Brief summary   78 y.o.  F with PMH significant for thickened endometrium, HTN, Type 2 DM, CKD stage IIIa, recent pancreatitis and acute cholecystitis s/p lap chole and D&C in May of this year who was admitted 7/1 for hysteroscopy and repeat D&C.  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 Pt was acidotic post-procedure, so decision made to leave intubated, PCCM consulted in this setting.  Significant events as below.  Patient was eventually transferred under [facility] to comanage on 03/07/2024 while GYN oncology remains the primary service. Significant events Patient was admitted to the ICU postop intubated on the 7/2 extubated 7/ 4.  CT abd and pelvis on 7/23 showing new abscesses. IR consulted and she underwent Removal of surgical drain  And placement of left paracolic gutter 25F drain yielding 10 mL purulent fluid. Fluid to be sent for analysis.  No new complaints today. Recommended to ambulate with PT and encouraged oral intake.   Assessment & Plan    Assessment and Plan:  Left foot gouty attack On colchicine  0.6 mg BID. She reports left foot pain has improved. Encouraged her to ambulate and get out of bed.  Xrays of the foot ordered for further evaluation. They have been negative. No soft tissue swelling. Pain has completed resolved.  Ambulate as tolerated.    S/p D&C procedure complicated by bowel perforation S/p partial colectomy and ileostomy  Septic shock,  shock resolved  Presented for d&c on 7/1 c/b bowel perforation. OR 7/2 after development of septic shock, zosyn   started.  Had ex lap with sigmoidoscopy, partial colectomy  and ileostomy placement.  CT AP 7/6 showing sigmoid diverticulitis, air-fluid level 4.6cm unable to discern abscess vs segment of colon.  Repeat CT abd and pelvis on 7/23 shows rim enhancing fluid and gas collection along the left pericolic gutter which measures 8.0 x 2.2 x 8.3 cm. Suspicious for abscess.  There is  also new loculated fluid along the uterus, endometrium measuring 4.4 by 2.9 by 2.4 cm. Evaluate for infection and possible fistula CT abd also shows thickened folds in the stomach, unclear etiology, similar presentation in June 5 th CT.  Culture from JP drain from 7/10  with E coli and Enterococcus , bacteroides. Patient is on meropenem  and zyvox .  IR consulted and she underwent Removal of surgical drain and placement of left paracolic gutter 25F drain yielding 10 mL purulent fluid ID on board and appreciate recommendations.   Pain control with dilaudid  1 mg every 2 hours prn, oxycodone  10 to 15 mg every 4 hours prn for moderate pain, and neurontin  300 mg TID.    Type 2 DM CBG (last 3)  Recent Labs    03/25/24 1811 03/26/24 0010 03/26/24 0555  GLUCAP 110* 96 121*   Resume SSI. No changes in meds.  Nutrition on board.  Encourage oral intake.    Hypertension Bp parameters are well controlled.    Hypothyroidism Resume synthroid  75 mcg daily.    Hyperlipidemia Resume zetia .   Acute on stage 3a CKD Resolved..  Creatinine at baseline.  Check labs in am.    Chest pain  Resolved.    Mild hyponatremia: Sodium ranging between 130 to 132.  She remains asymptomatic.    Hyperkalemia Resolved with lokelma .    Anemia of blood loss from surgery.  Hemoglobin around 8.9 and stable.  monitor and transfuse to keep it greater than 7.    Thrombocytosis  Possibly from the infection.   Urinary retention Of unclear etiology.  Please do in and out and if still retaining , place foley catheter.  Start her  IV fluids.    RN Pressure Injury Documentation:    Malnutrition Type:  Nutrition Problem: Severe Malnutrition Etiology: acute illness   Malnutrition Characteristics:  Signs/Symptoms: energy intake < or equal to 50% for > or equal to 5 days, mild muscle depletion, percent weight loss (10% in 1.5 months) Percent weight loss: 10 % (in 1.5 months)   Nutrition Interventions:  Interventions: Refer to RD note for recommendations  Estimated body mass index is 24.6 kg/m as calculated from the following:   Height as of this encounter: 5' 4 (1.626 m).   Weight as of this encounter: 65 kg.   Antimicrobials:   Anti-infectives (From admission, onward)    Start     Dose/Rate Route Frequency Ordered Stop   03/25/24 1442  meropenem  (MERREM ) 1 g in sodium chloride  0.9 % 100 mL IVPB        1 g 200 mL/hr over 30 Minutes Intravenous 2 times daily 03/25/24 1443 03/30/24 0559   03/16/24 1230  meropenem  (MERREM ) 1 g in sodium chloride  0.9 % 100 mL IVPB  Status:  Discontinued        1 g 200 mL/hr over 30 Minutes Intravenous Every 8 hours 03/16/24 1134 03/25/24 1443   03/14/24 1000  linezolid  (ZYVOX ) tablet 600 mg        600 mg Oral Every 12 hours 03/14/24 0903 03/26/24 2359   03/14/24 0930  piperacillin -tazobactam (ZOSYN ) IVPB 3.375 g  Status:  Discontinued        3.375 g 12.5 mL/hr over 240 Minutes Intravenous Every 8 hours 03/14/24 0833 03/16/24 1134   03/13/24 1300  linezolid  (ZYVOX ) IVPB 600 mg  Status:  Discontinued        600 mg 300 mL/hr over 60 Minutes Intravenous Every 12 hours 03/13/24 1200 03/14/24 0903   03/13/24 1300  ceFEPIme  (MAXIPIME ) 2 g in sodium chloride  0.9 % 100 mL IVPB  Status:  Discontinued        2 g 200 mL/hr over 30 Minutes Intravenous Every 8 hours 03/13/24 1200 03/14/24 0833   03/10/24 1200  amoxicillin -clavulanate (AUGMENTIN ) 875-125 MG per tablet 1 tablet  Status:  Discontinued        1 tablet Oral Every 12 hours 03/10/24 1114 03/13/24 1200   03/01/24 1200   piperacillin -tazobactam (ZOSYN ) IVPB 3.375 g  Status:  Discontinued        3.375 g 12.5 mL/hr over 240 Minutes Intravenous Every 8 hours 03/01/24 0946 03/01/24 1116   03/01/24 1130  piperacillin -tazobactam (ZOSYN ) IVPB 3.375 g  Status:  Discontinued  3.375 g 12.5 mL/hr over 240 Minutes Intravenous Every 8 hours 03/01/24 1116 03/10/24 1114        Medications  Scheduled Meds:  buPROPion   150 mg Oral QPM   Chlorhexidine  Gluconate Cloth  6 each Topical Daily   enoxaparin  (LOVENOX ) injection  40 mg Subcutaneous Q24H   ezetimibe   10 mg Oral Daily   feeding supplement ([employer] STANDARD 1.4)  325 mL Oral TID BM   gabapentin   300 mg Oral TID   insulin  aspart  0-20 Units Subcutaneous Q6H   levothyroxine   75 mcg Oral QAC breakfast   lidocaine   1 patch Transdermal Q24H   linagliptin   5 mg Oral Daily   linezolid   600 mg Oral Q12H   melatonin  3 mg Oral QHS   mirtazapine   7.5 mg Oral QHS   multivitamin with minerals  1 tablet Oral Daily   pantoprazole   40 mg Oral QHS   polyethylene glycol  17 g Oral Daily   sodium chloride  flush  10-40 mL Intracatheter Q12H   sodium chloride  flush  10-40 mL Intracatheter Q12H   sodium chloride  flush  5 mL Intracatheter Q8H   Continuous Infusions:  meropenem  (MERREM ) IV 1 g (03/26/24 0603)   PRN Meds:.hydrALAZINE , [DISCONTINUED]  HYDROmorphone  (DILAUDID ) injection **OR** HYDROmorphone  (DILAUDID ) injection, labetalol , methocarbamol , ondansetron  **OR** ondansetron  (ZOFRAN ) IV, mouth rinse, oxyCODONE , simethicone , sodium chloride  flush, sodium chloride  flush    Subjective:   Alexandria Durham was seen and examined today. No new complaints.  Overnight patient had urinary retention, able to urine later on.   Objective:   Vitals:   03/25/24 0609 03/25/24 1246 03/25/24 2103 03/26/24 0551  BP: (!) 147/75 139/71 131/79 120/72  Pulse: 90 95 92 92  Resp: 18 16 14 16   Temp: 98.3 F (36.8 C) 98.3 F (36.8 C) 99 F (37.2 C) 98.7 F (37.1 C)  TempSrc:  Oral Oral Oral Oral  SpO2: 96% 95% 98% 97%  Weight: 65 kg     Height:        Intake/Output Summary (Last 24 hours) at 03/26/2024 0917 Last data filed at 03/26/2024 0708 Gross per 24 hour  Intake 488.28 ml  Output 1205 ml  Net -716.72 ml   Filed Weights   03/19/24 0500 03/24/24 0636 03/25/24 0609  Weight: 70.7 kg 68.2 kg 65 kg     Exam General exam: Appears calm and comfortable  Respiratory system: Clear to auscultation. Respiratory effort normal. Cardiovascular system: S1 & S2 heard, RRR. No JVD,  Gastrointestinal system: Abdomen is  soft , mod gen tenderness bs+, colostomy in place.  Drain in place with pus in the bulb.  Central nervous system: Alert and oriented. Extremities: no pedal edema.  Skin: No rashes,  Psychiatry: Mood & affect appropriate.         Data Reviewed:  I have personally reviewed following labs and imaging studies   CBC Lab Results  Component Value Date   WBC 10.4 03/24/2024   RBC 2.97 (L) 03/24/2024   HGB 8.9 (L) 03/24/2024   HCT 27.1 (L) 03/24/2024   MCV 91.2 03/24/2024   MCH 30.0 03/24/2024   PLT 605 (H) 03/24/2024   MCHC 32.8 03/24/2024   RDW 16.4 (H) 03/24/2024   LYMPHSABS 1.5 03/24/2024   MONOABS 0.9 03/24/2024   EOSABS 0.0 03/24/2024   BASOSABS 0.0 03/24/2024     Last metabolic panel Lab Results  Component Value Date   NA 132 (L) 03/25/2024   K 4.2 03/25/2024   CL 98  03/25/2024   CO2 24 03/25/2024   BUN 17 03/25/2024   CREATININE 0.97 03/25/2024   GLUCOSE 174 (H) 03/25/2024   GFRNONAA >60 03/25/2024   GFRAA 51 (L) 03/08/2016   CALCIUM  8.9 03/25/2024   PHOS 3.4 03/20/2024   PROT 6.0 (L) 03/20/2024   ALBUMIN  1.7 (L) 03/20/2024   BILITOT 0.6 03/20/2024   ALKPHOS 100 03/20/2024   AST 44 (H) 03/20/2024   ALT 68 (H) 03/20/2024   ANIONGAP 10 03/25/2024    CBG (last 3)  Recent Labs    03/25/24 1811 03/26/24 0010 03/26/24 0555  GLUCAP 110* 96 121*      Coagulation Profile: No results for input(s): INR,  PROTIME in the last 168 hours.   Radiology Studies: No results found.      Elgie Butter M.D. [name] 03/26/2024, 9:17 AM  Available via Epic secure chat 7am-7pm After 7 pm, please refer to night coverage provider listed on amion.

## 2024-03-26 NOTE — Progress Notes (Signed)
 Patient output has decreased and patient has not urinated for 8 hours. Patient was bladder scanned with residual of 65 cc.Will continue to follow plan of care.

## 2024-03-26 NOTE — Plan of Care (Signed)
  Problem: Clinical Measurements: Goal: Ability to maintain clinical measurements within normal limits will improve Outcome: Progressing Goal: Will remain free from infection Outcome: Progressing Goal: Diagnostic test results will improve Outcome: Progressing Goal: Respiratory complications will improve Outcome: Progressing Goal: Cardiovascular complication will be avoided Outcome: Progressing   Problem: Coping: Goal: Level of anxiety will decrease Outcome: Progressing   Problem: Elimination: Goal: Will not experience complications related to urinary retention Outcome: Progressing   Problem: Pain Managment: Goal: General experience of comfort will improve and/or be controlled Outcome: Progressing   Problem: Safety: Goal: Ability to remain free from injury will improve Outcome: Progressing   Problem: Skin Integrity: Goal: Risk for impaired skin integrity will decrease Outcome: Progressing   Problem: Fluid Volume: Goal: Ability to maintain a balanced intake and output will improve Outcome: Progressing   Problem: Nutritional: Goal: Maintenance of adequate nutrition will improve Outcome: Progressing Goal: Progress toward achieving an optimal weight will improve Outcome: Progressing   Problem: Skin Integrity: Goal: Risk for impaired skin integrity will decrease Outcome: Progressing   Problem: Tissue Perfusion: Goal: Adequacy of tissue perfusion will improve Outcome: Progressing   Problem: Education: Goal: Knowledge of the prescribed therapeutic regimen will improve Outcome: Progressing Goal: Understanding of sexual limitations or changes related to disease process or condition will improve Outcome: Progressing   Problem: Self-Concept: Goal: Communication of feelings regarding changes in body function or appearance will improve Outcome: Progressing   Problem: Skin Integrity: Goal: Demonstration of wound healing without infection will improve Outcome:  Progressing   Problem: Activity: Goal: Ability to tolerate increased activity will improve Outcome: Progressing

## 2024-03-26 NOTE — Progress Notes (Signed)
 25 Days Post-Op Procedure(s) (LRB): LAPAROTOMY, EXPLORATORY (N/A) SIGMOIDOSCOPY COLECTOMY, WITH COLOSTOMY CREATION MOBILIZATION, SPLENIC FLEXURE, WITH PARTIAL COLECTOMY CYSTOSCOPY  Subjective: Patient reports doing well.  Daughter is visiting her.  Notes that she ate part of breakfast and lunch yesterday (nothing documented), had some oatmeal this morning.  Had 1.5 nutritional shakes yesterday.  Has just started her first today.  Abdominal pain is stable.  Left foot feels better.  Was able to stand and work with PT yesterday.  Objective: Vital signs in last 24 hours: Temp:  [98.3 F (36.8 C)-99 F (37.2 C)] 98.7 F (37.1 C) (07/27 0551) Pulse Rate:  [92-95] 92 (07/27 0551) Resp:  [14-16] 16 (07/27 0551) BP: (120-139)/(71-79) 120/72 (07/27 0551) SpO2:  [95 %-98 %] 97 % (07/27 0551) Last BM Date : 03/25/24  Intake/Output from previous day: 07/26 0701 - 07/27 0700 In: 488.3 [P.O.:360; IV Piggyback:128.3] Out: 905 [Urine:650; Drains:80; Stool:175]  Physical Examination: General: no acute distress, in bed Respiratory: Normal work of breathing. Lungs clear to auscultation bilaterally without rhonchi. Cardiovascular: Regular sinus rhythm and rate, no murmurs or rubs. Abdomen: soft, mildly distended, + BS, tender to palpation diffusely (stable). Incision: wound VAC in place to suction- cloudy serosanguinous drainage in the canister. Left paracolic gutter drain with purulent  drainage. Dry dressing around drain. Ostomy viable, bag with 20 cc soft stool, gas.    Extremities: extremities warm and well perfused, tenderness with movement of left LE improving Gu: Purewick in place with concentrated yellow urine  Labs: Drain culture from 7/24 - few e coli, rare pseudomonas  Assessment:  78 y.o. s/p Procedure(s): LAPAROTOMY, EXPLORATORY SIGMOIDOSCOPY COLECTOMY, WITH COLOSTOMY CREATION MOBILIZATION, SPLENIC FLEXURE, WITH PARTIAL COLECTOMY CYSTOSCOPY: working on increasing PO intake,  physical conditioning   Post-op: - continue to work on being up to chair, out of bed with assistance. PT/OT - poor PO intake since TPN discontinued on 7/21. Drinking 1-2 nutrition shakes daily, yesterday had some solid food. Calorie count imperative.  Patient has been able to increase her p.o. intake per her report the last 2 days.  Had spoken with her at the end of last week about possibility of tube feeds if she is not able to continue to increase intake. - continue Remeron  - having bowel function although low output in setting of poor PO intake, slowly improving - purewick in place, minimal UOP overnight, improved this morning.  Given concentrated appearance of the urine, discussed importance of good p.o. intake.  CMP ordered.   Intra-abdominal infection: 7/10 culture with e coli and enterococcus 7/14 incision culture with e coli and pseudomonas  7/24 culture from IR drain: ecoli and pseudomonas, both sensitive to imipenem. Linezolid  may be able to be discontinued Current antibiotic regimen: linezolid  (7/14) and meropenem  (7/17) - CT on 7/23 with 8 cm left para-colic gutter fluid collection concerning for abscess. IR drain placement on 7/24 (removed JP from surgery at the time of this drain placement) - ID following. Appreciate their recommendations - leukocytosis resolved   Severe calorie-deficient nutrition: Patient voices some frustration in expectations for her p.o. intake.  She is trying to eat more even though not feeling hungry.  Encouragement given today. - continue TID supplements - continue strict calorie counts - if unable to increase PO intake over next 1-2 days, revisit recommendation for tube feeds - message sent to RN to chart quantity of food and supplements consumed each shift   Hyponatremia: - asymptomatic, stable   Acute on chronic anemia: - asymptomatic, continue to monitor  Thickened endometrium: - failed endometrial sampling on multiple attempts  (hysteroscopic and under intra-abdominal visualization) - no definitive malignancy or hyperplasia diagnosis   Type 2 diabetes mellitus:  - CBGs 96-165 in last 24 hours - continue insulin  sliding scale    Prophylaxis:  - SCDs, lovenox    LOS: 26 days    [place] JONELLE Dollar 03/26/2024, 12:04 PM

## 2024-03-27 ENCOUNTER — Encounter: Admitting: Psychiatry

## 2024-03-27 DIAGNOSIS — M109 Gout, unspecified: Secondary | ICD-10-CM | POA: Diagnosis not present

## 2024-03-27 DIAGNOSIS — N950 Postmenopausal bleeding: Secondary | ICD-10-CM | POA: Diagnosis not present

## 2024-03-27 DIAGNOSIS — A499 Bacterial infection, unspecified: Secondary | ICD-10-CM

## 2024-03-27 LAB — GLUCOSE, CAPILLARY
Glucose-Capillary: 105 mg/dL — ABNORMAL HIGH (ref 70–99)
Glucose-Capillary: 147 mg/dL — ABNORMAL HIGH (ref 70–99)
Glucose-Capillary: 145 mg/dL — ABNORMAL HIGH (ref 70–99)
Glucose-Capillary: 71 mg/dL (ref 70–99)

## 2024-03-27 MED ORDER — INSULIN ASPART 100 UNIT/ML IJ SOLN
0.0000 [IU] | Freq: Three times a day (TID) | INTRAMUSCULAR | Status: DC
  Administered 2024-03-27 – 2024-03-28 (×3): 1 [IU] via SUBCUTANEOUS

## 2024-03-27 NOTE — Progress Notes (Signed)
 Dressing change at [facility] at this time due to bleeding around site. Cleansed with Normal Saline.

## 2024-03-27 NOTE — Progress Notes (Incomplete)
 PHARMACY CONSULT NOTE FOR:  OUTPATIENT  PARENTERAL ANTIBIOTIC THERAPY (OPAT)  Indication:  Regimen:  End date:   IV antibiotic discharge orders are pended. To discharging provider:  please sign these orders via discharge navigator,  Select New Orders & click on the button choice - Manage This Unsigned Work.     Thank you for allowing pharmacy to be a part of this patient's care.  Gladis Almarie Caldron 03/27/2024, 9:14 AM

## 2024-03-27 NOTE — Consult Note (Signed)
 WOC Nurse wound follow up Wound type: surgical abd midline Measurement: 10 cm x 5 cm x 3 cm Wound bed:  90% red granulate tissue, 10% yellow. Drainage (amount, consistency, odor) milky, yellow 250 ml on the canister at 1200.  Periwound: intact Dressing procedure/placement/frequency: Removed old NPWT dressing Cleansed wound with normal saline Filled wound with 1 piece of black foam  Sealed NPWT dressing at HG/175mmHG  Half ring around the belly button area and half at the base of the wound. Patient received IV pain medication per bedside nurse prior to dressing change Patient tolerated procedure, but she had considerate pain, the bed nurse came to give to her more pain med. WOC nurse will continue to provide NPWT dressing changed due to the complexity of the dressing change.   WOC Nurse ostomy follow up Stoma type/location: LUQ colostomy Stomal assessment/size: oval shape 25 mm x 20 mm, surround by yellow slough stoma separation. 90% yellow, 10% black. Peristomal assessment: mucous separation. Wound 0.5 cm x 0.5 cm at 12 o'clock position. Treatment options for stomal/peristomal skin: Ring 2, powder #6  Output 120 ml aprox. Of brown soft stools  Ostomy pouching: 1pc.#[id number].  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  Bed nurse instruction: In case of leaking, change the bag. - Empty when 1/3 full; - Cut the new waffle using the template that I left in the room. - Cleanse the skin with saline; - On the small wound at 12 o'clock, you can use Powder#6 - Apply the ring around the cut. - Close the lock in roll system. - Write the date you replace it.  Enrolled patient in [place] Secure Start Discharge program: Yes  WOC team will follow THURS. Please reconsult if further assistance is needed. Thank-you,  Lela Holm BSN, CNS, RN, [employer], WOCN  (Phone [phone])

## 2024-03-27 NOTE — Progress Notes (Signed)
 [name]  PROGRESS NOTE  Alexandria Durham FMW:[id number] DOB: 12-14-1945 DOA: 02/29/2024 PCP: [name], NP   Brief HPI:    78 y.o.  F with PMH significant for thickened endometrium, HTN, Type 2 DM, CKD stage IIIa, recent pancreatitis and acute cholecystitis s/p lap chole and D&C in May of this year who was admitted 7/1 for hysteroscopy and repeat D&C.  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 Pt was acidotic post-procedure, so decision made to leave intubated, PCCM consulted in this setting.  Significant events as below.  Patient was eventually transferred under [facility] to comanage on 03/07/2024 while GYN oncology remains the primary service. Significant events Patient was admitted to the ICU postop intubated on the 7/2 extubated 7/ 4.  CT abd and pelvis on 7/23 showing new abscesses. IR consulted and she underwent Removal of surgical drain  And placement of left paracolic gutter 81F drain yielding 10 mL purulent fluid. Fluid to be sent for analysis.  No new complaints today. Recommended to ambulate with PT and encouraged oral intake.      Assessment/Plan:   Left foot gouty attack On colchicine  0.6 mg BID. She reports left foot pain has improved. Encouraged her to ambulate and get out of bed.  Xrays of the foot ordered for further evaluation. They have been negative. No soft tissue swelling. Pain has completed resolved.  Ambulate as tolerated.      S/p D&C procedure complicated by bowel perforation S/p partial colectomy and ileostomy  Septic shock,  shock resolved  Presented for d&c on 7/1 c/b bowel perforation. OR 7/2 after development of septic shock, zosyn  started.  Had ex lap with sigmoidoscopy, partial colectomy  and ileostomy placement.  CT AP 7/6 showing sigmoid diverticulitis, air-fluid level 4.6cm unable to discern abscess vs  segment of colon.  Repeat CT abd and pelvis on 7/23 shows rim enhancing fluid and gas collection along the left pericolic gutter which measures 8.0 x 2.2 x 8.3 cm. Suspicious for abscess.  There is  also new loculated fluid along the uterus, endometrium measuring 4.4 by 2.9 by 2.4 cm. Evaluate for infection and possible fistula CT abd also shows thickened folds in the stomach, unclear etiology, similar presentation in June 5 th CT.  Culture from JP drain from 7/10  with E coli and Enterococcus , bacteroides. Patient is on meropenem  and zyvox .  IR consulted and she underwent Removal of surgical drain and placement of left paracolic gutter 81F drain yielding 10 mL purulent fluid ID on board and appreciate recommendations.    Pain control with dilaudid  1 mg every 2 hours prn, oxycodone  10 to 15 mg every 4 hours prn for moderate pain, and neurontin  300 mg TID.      Type 2 DM CBG (last 3)  Recent Labs (last 2 labs)       Recent Labs    03/25/24 1811 03/26/24 0010 03/26/24 0555  GLUCAP 110* 96 121*      Resume SSI. No changes in meds.  Nutrition on board.  Encourage oral intake.      Hypertension Bp parameters are well controlled.      Hypothyroidism Resume synthroid  75 mcg daily.      Hyperlipidemia Resume zetia .    Acute on stage 3a CKD Resolved..  Creatinine at baseline.  Check labs in am.  Chest pain  Resolved.      Mild hyponatremia: Sodium ranging between 130 to 132.  She remains asymptomatic.      Hyperkalemia Resolved with lokelma .      Anemia of blood loss from surgery.  Hemoglobin around 8.9 and stable.  monitor and transfuse to keep it greater than 7.      Thrombocytosis  Possibly from the infection.       TR[facility] will sign off, call us  again if needed.  Medications     buPROPion   150 mg Oral QPM   Chlorhexidine  Gluconate Cloth  6 each Topical Daily   enoxaparin  (LOVENOX ) injection  40 mg Subcutaneous Q24H   ezetimibe   10 mg Oral Daily    feeding supplement (KATE FARMS STANDARD 1.4)  325 mL Oral TID BM   gabapentin   300 mg Oral TID   insulin  aspart  0-20 Units Subcutaneous Q6H   levothyroxine   75 mcg Oral QAC breakfast   lidocaine   1 patch Transdermal Q24H   linagliptin   5 mg Oral Daily   melatonin  3 mg Oral QHS   mirtazapine   7.5 mg Oral QHS   multivitamin with minerals  1 tablet Oral Daily   pantoprazole   40 mg Oral QHS   polyethylene glycol  17 g Oral Daily   sodium chloride  flush  10-40 mL Intracatheter Q12H   sodium chloride  flush  10-40 mL Intracatheter Q12H   sodium chloride  flush  5 mL Intracatheter Q8H     Data Reviewed:   CBG:  Recent Labs  Lab 03/26/24 0555 03/26/24 1148 03/26/24 1814 03/26/24 2334 03/27/24 0514  GLUCAP 121* 113* 132* 149* 105*    SpO2: 98 % O2 Flow Rate (L/min): 2 L/min FiO2 (%): 30 %    Vitals:   03/26/24 1345 03/26/24 2056 03/27/24 0500 03/27/24 0503  BP: (!) 140/76 107/63  126/70  Pulse: 93 98  93  Resp:  18  18  Temp: 98.9 F (37.2 C) 98.6 F (37 C)  98.7 F (37.1 C)  TempSrc: Oral Oral  Oral  SpO2: 96% 95%  98%  Weight:   70.6 kg   Height:          Data Reviewed:  Basic Metabolic Panel: Recent Labs  Lab 03/22/24 0812 03/24/24 0540 03/25/24 1221 03/26/24 1313  NA 131* 130* 132* 132*  K 4.5 4.2 4.2 4.3  CL 99 96* 98 97*  CO2 25 25 24 25   GLUCOSE 157* 96 174* 113*  BUN 18 16 17 15   CREATININE 0.92 0.92 0.97 1.09*  CALCIUM  9.1 9.1 8.9 8.9    CBC: Recent Labs  Lab 03/20/24 0858 03/22/24 0812 03/24/24 0540 03/26/24 1313  WBC 8.9 10.1 10.4 7.4  NEUTROABS  --  7.0 7.8* 4.9  HGB 8.1* 8.7* 8.9* 8.7*  HCT 25.4* 27.5* 27.1* 27.1*  MCV 91.7 91.7 91.2 92.2  PLT 535* 584* 605* 601*    LFT Recent Labs  Lab 03/26/24 1313  AST 31  ALT 41  ALKPHOS 88  BILITOT 0.3  PROT 7.0  ALBUMIN  2.1*     Antibiotics: Anti-infectives (From admission, onward)    Start     Dose/Rate Route Frequency Ordered Stop   03/25/24 1442  meropenem  (MERREM ) 1  g in sodium chloride  0.9 % 100 mL IVPB        1 g 200 mL/hr over 30 Minutes Intravenous 2 times daily 03/25/24 1443 03/30/24 0559   03/16/24 1230  meropenem  (MERREM ) 1 g in sodium chloride   0.9 % 100 mL IVPB  Status:  Discontinued        1 g 200 mL/hr over 30 Minutes Intravenous Every 8 hours 03/16/24 1134 03/25/24 1443   03/14/24 1000  linezolid  (ZYVOX ) tablet 600 mg        600 mg Oral Every 12 hours 03/14/24 0903 03/26/24 2218   03/14/24 0930  piperacillin -tazobactam (ZOSYN ) IVPB 3.375 g  Status:  Discontinued        3.375 g 12.5 mL/hr over 240 Minutes Intravenous Every 8 hours 03/14/24 0833 03/16/24 1134   03/13/24 1300  linezolid  (ZYVOX ) IVPB 600 mg  Status:  Discontinued        600 mg 300 mL/hr over 60 Minutes Intravenous Every 12 hours 03/13/24 1200 03/14/24 0903   03/13/24 1300  ceFEPIme  (MAXIPIME ) 2 g in sodium chloride  0.9 % 100 mL IVPB  Status:  Discontinued        2 g 200 mL/hr over 30 Minutes Intravenous Every 8 hours 03/13/24 1200 03/14/24 0833   03/10/24 1200  amoxicillin -clavulanate (AUGMENTIN ) 875-125 MG per tablet 1 tablet  Status:  Discontinued        1 tablet Oral Every 12 hours 03/10/24 1114 03/13/24 1200   03/01/24 1200  piperacillin -tazobactam (ZOSYN ) IVPB 3.375 g  Status:  Discontinued        3.375 g 12.5 mL/hr over 240 Minutes Intravenous Every 8 hours 03/01/24 0946 03/01/24 1116   03/01/24 1130  piperacillin -tazobactam (ZOSYN ) IVPB 3.375 g  Status:  Discontinued        3.375 g 12.5 mL/hr over 240 Minutes Intravenous Every 8 hours 03/01/24 1116 03/10/24 1114          Subjective   Denies foot pain   Objective    Physical Examination:   General-appears in no acute distress Heart-S1-S2, regular, no murmur auscultated Lungs-clear to auscultation bilaterally, no wheezing or crackles auscultated Abdomen-soft, nontender, no organomegaly Extremities-no edema in the lower extremities   Status is: Inpatient:             Sabas GORMAN Brod   [facility] If 7PM-7AM, please contact night-coverage at [email or web address], Office  [phone]   03/27/2024, 8:20 AM  LOS: 27 days

## 2024-03-27 NOTE — Progress Notes (Addendum)
 Subjective: No new complaints   Antibiotics:  Anti-infectives (From admission, onward)    Start     Dose/Rate Route Frequency Ordered Stop   03/25/24 1442  meropenem  (MERREM ) 1 g in sodium chloride  0.9 % 100 mL IVPB        1 g 200 mL/hr over 30 Minutes Intravenous 2 times daily 03/25/24 1443 03/30/24 0559   03/16/24 1230  meropenem  (MERREM ) 1 g in sodium chloride  0.9 % 100 mL IVPB  Status:  Discontinued        1 g 200 mL/hr over 30 Minutes Intravenous Every 8 hours 03/16/24 1134 03/25/24 1443   03/14/24 1000  linezolid  (ZYVOX ) tablet 600 mg        600 mg Oral Every 12 hours 03/14/24 0903 03/26/24 2218   03/14/24 0930  piperacillin -tazobactam (ZOSYN ) IVPB 3.375 g  Status:  Discontinued        3.375 g 12.5 mL/hr over 240 Minutes Intravenous Every 8 hours 03/14/24 0833 03/16/24 1134   03/13/24 1300  linezolid  (ZYVOX ) IVPB 600 mg  Status:  Discontinued        600 mg 300 mL/hr over 60 Minutes Intravenous Every 12 hours 03/13/24 1200 03/14/24 0903   03/13/24 1300  ceFEPIme  (MAXIPIME ) 2 g in sodium chloride  0.9 % 100 mL IVPB  Status:  Discontinued        2 g 200 mL/hr over 30 Minutes Intravenous Every 8 hours 03/13/24 1200 03/14/24 0833   03/10/24 1200  amoxicillin -clavulanate (AUGMENTIN ) 875-125 MG per tablet 1 tablet  Status:  Discontinued        1 tablet Oral Every 12 hours 03/10/24 1114 03/13/24 1200   03/01/24 1200  piperacillin -tazobactam (ZOSYN ) IVPB 3.375 g  Status:  Discontinued        3.375 g 12.5 mL/hr over 240 Minutes Intravenous Every 8 hours 03/01/24 0946 03/01/24 1116   03/01/24 1130  piperacillin -tazobactam (ZOSYN ) IVPB 3.375 g  Status:  Discontinued        3.375 g 12.5 mL/hr over 240 Minutes Intravenous Every 8 hours 03/01/24 1116 03/10/24 1114       Medications: Scheduled Meds:  buPROPion   150 mg Oral QPM   Chlorhexidine  Gluconate Cloth  6 each Topical Daily   enoxaparin  (LOVENOX ) injection  40 mg Subcutaneous Q24H   ezetimibe   10 mg Oral Daily    feeding supplement (KATE FARMS STANDARD 1.4)  325 mL Oral TID BM   gabapentin   300 mg Oral TID   insulin  aspart  0-20 Units Subcutaneous Q6H   levothyroxine   75 mcg Oral QAC breakfast   lidocaine   1 patch Transdermal Q24H   linagliptin   5 mg Oral Daily   melatonin  3 mg Oral QHS   mirtazapine   7.5 mg Oral QHS   multivitamin with minerals  1 tablet Oral Daily   pantoprazole   40 mg Oral QHS   polyethylene glycol  17 g Oral Daily   sodium chloride  flush  10-40 mL Intracatheter Q12H   sodium chloride  flush  10-40 mL Intracatheter Q12H   sodium chloride  flush  5 mL Intracatheter Q8H   Continuous Infusions:  lactated ringers  75 mL/hr at 03/27/24 9347   meropenem  (MERREM ) IV Stopped (03/27/24 0630)   PRN Meds:.hydrALAZINE , [DISCONTINUED]  HYDROmorphone  (DILAUDID ) injection **OR** HYDROmorphone  (DILAUDID ) injection, labetalol , methocarbamol , ondansetron  **OR** ondansetron  (ZOFRAN ) IV, mouth rinse, oxyCODONE , simethicone , sodium chloride  flush, sodium chloride  flush    Objective: Weight change:   Intake/Output Summary (Last 24 hours) at 03/27/2024  9053 Last data filed at 03/27/2024 9347 Gross per 24 hour  Intake 2506.52 ml  Output 2221 ml  Net 285.52 ml   Blood pressure 126/70, pulse 93, temperature 98.7 F (37.1 C), temperature source Oral, resp. rate 18, height 5' 4 (1.626 m), weight 70.6 kg, SpO2 98%. Temp:  [98.6 F (37 C)-98.9 F (37.2 C)] 98.7 F (37.1 C) (07/28 0503) Pulse Rate:  [93-98] 93 (07/28 0503) Resp:  [18] 18 (07/28 0503) BP: (107-140)/(63-76) 126/70 (07/28 0503) SpO2:  [95 %-98 %] 98 % (07/28 0503) Weight:  [70.6 kg] 70.6 kg (07/28 0500)  Physical Exam: Physical Exam Constitutional:      General: She is not in acute distress.    Appearance: She is well-developed. She is not diaphoretic.  HENT:     Head: Normocephalic and atraumatic.     Right Ear: External ear normal.     Left Ear: External ear normal.     Mouth/Throat:     Pharynx: No oropharyngeal  exudate.  Eyes:     General: No scleral icterus.    Conjunctiva/sclera: Conjunctivae normal.     Pupils: Pupils are equal, round, and reactive to light.  Cardiovascular:     Rate and Rhythm: Normal rate and regular rhythm.  Pulmonary:     Effort: Pulmonary effort is normal. No respiratory distress.     Breath sounds: Normal breath sounds. No wheezing.  Abdominal:     General: There is no distension.     Palpations: Abdomen is soft.     Comments: Diveriting ostomy in place, vacuum dressing, JP drain with purulent material  Musculoskeletal:        General: No tenderness. Normal range of motion.  Lymphadenopathy:     Cervical: No cervical adenopathy.  Skin:    General: Skin is warm and dry.     Coloration: Skin is not pale.     Findings: No erythema or rash.  Neurological:     General: No focal deficit present.     Mental Status: She is alert and oriented to person, place, and time.     Motor: No abnormal muscle tone.     Coordination: Coordination normal.  Psychiatric:        Mood and Affect: Mood normal.        Behavior: Behavior normal.        Thought Content: Thought content normal.        Judgment: Judgment normal.      CBC:    BMET Recent Labs    03/25/24 1221 03/26/24 1313  NA 132* 132*  K 4.2 4.3  CL 98 97*  CO2 24 25  GLUCOSE 174* 113*  BUN 17 15  CREATININE 0.97 1.09*  CALCIUM  8.9 8.9     Liver Panel  Recent Labs    03/26/24 1313  PROT 7.0  ALBUMIN  2.1*  AST 31  ALT 41  ALKPHOS 88  BILITOT 0.3       Sedimentation Rate No results for input(s): ESRSEDRATE in the last 72 hours. C-Reactive Protein No results for input(s): CRP in the last 72 hours.  Micro Results: Recent Results (from the past 720 hours)  Urine Culture     Status: None   Collection Time: 03/01/24  5:57 PM   Specimen: Urine, Catheterized  Result Value Ref Range Status   Specimen Description   Final    URINE, CATHETERIZED Performed at [facility], [address], [place], [place] [address]    Special  Requests   Final    NONE Performed at [facility], [address], [place], [place] [address]    Culture   Final    NO GROWTH Performed at [facility], [address], [place], [place] [address]    Report Status 03/02/2024 FINAL  Final  MRSA Next Gen by PCR, Nasal     Status: None   Collection Time: 03/02/24  9:49 AM   Specimen: Nasal Mucosa; Nasal Swab  Result Value Ref Range Status   MRSA by PCR Next Gen NOT DETECTED NOT DETECTED Final    Comment: (NOTE) The GeneXpert MRSA Assay (FDA approved for NASAL specimens only), is one component of a comprehensive MRSA colonization surveillance program. It is not intended to diagnose MRSA infection nor to guide or monitor treatment for MRSA infections. Test performance is not FDA approved in patients less than 15 years old. Performed at [facility], [address], [place], [place] [address]   Urine Culture     Status: None   Collection Time: 03/06/24  4:37 PM   Specimen: Urine, Catheterized  Result Value Ref Range Status   Specimen Description   Final    URINE, CATHETERIZED Performed at [facility], [address] [place], [place] [address]    Special Requests   Final    NONE Performed at [facility], [address], [place], [place] [address]    Culture   Final    NO GROWTH Performed at [facility], [address] [place], [place] [address]    Report Status 03/07/2024 FINAL  Final  Aerobic/Anaerobic Culture w Gram Stain (surgical/deep wound)     Status: None   Collection Time: 03/09/24 10:22 AM   Specimen: JP Drain  Result Value Ref Range Status   Specimen Description   Final    JP DRAINAGE Performed at [facility], [address], [place], [place] [address]    Special Requests   Final    NONE Performed at [facility], [address] [place], [place] [address]    Gram Stain NO WBC SEEN RARE GRAM POSITIVE COCCI   Final   Culture   Final    FEW ESCHERICHIA COLI RARE ESCHERICHIA COLI FEW ENTEROCOCCUS SPECIES FEW BACTEROIDES OVATUS BETA LACTAMASE POSITIVE Performed at [facility], [address] [place], [place] [address]    Report Status 03/14/2024 FINAL  Final   Organism ID, Bacteria ESCHERICHIA COLI  Final   Organism ID, Bacteria ESCHERICHIA COLI  Final   Organism ID, Bacteria ENTEROCOCCUS SPECIES  Final   Organism ID, Bacteria ESCHERICHIA COLI  Final   Organism ID, Bacteria ESCHERICHIA COLI  Final      Susceptibility   Escherichia coli - MIC*    AMPICILLIN >=32 RESISTANT Resistant     CEFEPIME  <=0.12 SENSITIVE Sensitive     CEFTAZIDIME <=1 SENSITIVE Sensitive     CEFTRIAXONE  <=0.25 SENSITIVE Sensitive     CIPROFLOXACIN >=4 RESISTANT Resistant     GENTAMICIN <=1 SENSITIVE Sensitive     IMIPENEM <=0.25 SENSITIVE Sensitive     TRIMETH/SULFA >=320 RESISTANT Resistant     AMPICILLIN/SULBACTAM 16 INTERMEDIATE Intermediate     PIP/TAZO <=4 SENSITIVE Sensitive ug/mL   Escherichia coli - MIC*    AMPICILLIN >=32 RESISTANT Resistant     CEFEPIME  <=0.12 SENSITIVE Sensitive     CEFTAZIDIME <=1 SENSITIVE Sensitive     CEFTRIAXONE  <=0.25 SENSITIVE Sensitive  CIPROFLOXACIN <=0.25 SENSITIVE Sensitive     GENTAMICIN <=1 SENSITIVE Sensitive     IMIPENEM <=0.25 SENSITIVE Sensitive     TRIMETH/SULFA <=20 SENSITIVE Sensitive     AMPICILLIN/SULBACTAM >=32 RESISTANT Resistant     PIP/TAZO <=4 SENSITIVE Sensitive ug/mL   Escherichia coli - KIRBY BAUER*    CEFAZOLIN  INTERMEDIATE Intermediate    Escherichia coli - KIRBY BAUER*    CEFAZOLIN  RESISTANT Resistant     * FEW ESCHERICHIA COLI    RARE ESCHERICHIA COLI    FEW ESCHERICHIA COLI    RARE ESCHERICHIA COLI   Enterococcus species - MIC*    AMPICILLIN 16 RESISTANT Resistant     VANCOMYCIN <=0.5 SENSITIVE Sensitive     GENTAMICIN SYNERGY SENSITIVE  Sensitive     * FEW ENTEROCOCCUS SPECIES  Aerobic Culture w Gram Stain (superficial specimen)     Status: None   Collection Time: 03/13/24  2:21 PM   Specimen: Abdomen  Result Value Ref Range Status   Specimen Description   Final    ABDOMEN Performed at [facility], [address], [place], [place] [address]    Special Requests   Final    NONE Performed at [facility], [address], [place], [place] [address]    Gram Stain   Final    RARE WBC PRESENT, PREDOMINANTLY PMN RARE GRAM NEGATIVE RODS Performed at [facility], [address] [place], [place] [address]    Culture   Final    MODERATE ESCHERICHIA COLI Two isolates with different morphologies were identified as the same organism.The most resistant organism was reported. MODERATE PSEUDOMONAS AERUGINOSA    Report Status 03/16/2024 FINAL  Final   Organism ID, Bacteria ESCHERICHIA COLI  Final   Organism ID, Bacteria PSEUDOMONAS AERUGINOSA  Final      Susceptibility   Escherichia coli - MIC*    AMPICILLIN >=32 RESISTANT Resistant     CEFEPIME  <=0.12 SENSITIVE Sensitive     CEFTAZIDIME <=1 SENSITIVE Sensitive     CEFTRIAXONE  <=0.25 SENSITIVE Sensitive     CIPROFLOXACIN >=4 RESISTANT Resistant     GENTAMICIN <=1 SENSITIVE Sensitive     IMIPENEM <=0.25 SENSITIVE Sensitive     TRIMETH/SULFA >=320 RESISTANT Resistant     AMPICILLIN/SULBACTAM 16 INTERMEDIATE Intermediate     PIP/TAZO <=4 SENSITIVE Sensitive ug/mL    * MODERATE ESCHERICHIA COLI   Pseudomonas aeruginosa - MIC*    CEFTAZIDIME >=64 RESISTANT Resistant     CIPROFLOXACIN 0.5 SENSITIVE Sensitive     GENTAMICIN 2 SENSITIVE Sensitive     IMIPENEM 1 SENSITIVE Sensitive     CEFEPIME  >=32 RESISTANT Resistant     * MODERATE PSEUDOMONAS AERUGINOSA  Aerobic/Anaerobic Culture w Gram Stain (surgical/deep wound)     Status: None   Collection Time: 03/23/24  4:26 PM   Specimen: Abscess  Result Value Ref Range Status    Specimen Description   Final    ABSCESS Performed at [facility], [address], [place], [place] [address]    Special Requests   Final    NONE Performed at [facility], [address] [place], [place] [address]    Gram Stain   Final    ABUNDANT WBC PRESENT, PREDOMINANTLY PMN RARE GRAM POSITIVE COCCI IN PAIRS    Culture   Final    FEW ESCHERICHIA COLI RARE PSEUDOMONAS AERUGINOSA RARE BACTEROIDES THETAIOTAOMICRON RARE BACTEROIDES CACCAE BETA LACTAMASE POSITIVE Performed at [facility], [address]  [place], [place] [address]    Report Status 03/26/2024 FINAL  Final   Organism ID, Bacteria ESCHERICHIA COLI  Final   Organism ID, Bacteria PSEUDOMONAS AERUGINOSA  Final      Susceptibility   Escherichia coli - MIC*    AMPICILLIN >=32 RESISTANT Resistant     CEFEPIME  <=0.12 SENSITIVE Sensitive     CEFTAZIDIME <=1 SENSITIVE Sensitive     CEFTRIAXONE  <=0.25 SENSITIVE Sensitive     CIPROFLOXACIN <=0.25 SENSITIVE Sensitive     GENTAMICIN <=1 SENSITIVE Sensitive     IMIPENEM <=0.25 SENSITIVE Sensitive     TRIMETH/SULFA <=20 SENSITIVE Sensitive     AMPICILLIN/SULBACTAM >=32 RESISTANT Resistant     PIP/TAZO <=4 SENSITIVE Sensitive ug/mL    * FEW ESCHERICHIA COLI   Pseudomonas aeruginosa - MIC*    CEFTAZIDIME >=64 RESISTANT Resistant     CIPROFLOXACIN 0.5 SENSITIVE Sensitive     GENTAMICIN <=1 SENSITIVE Sensitive     IMIPENEM 1 SENSITIVE Sensitive     * RARE PSEUDOMONAS AERUGINOSA    Studies/Results: No results found.    Assessment/Plan:  INTERVAL HISTORY: cultures from abscess reviewed   Principal Problem:   Postmenopausal bleeding Active Problems:   Protein-calorie malnutrition, severe    Alexandria Durham is a 78 y.o. female with history of postmenopausal bleeding status post multiple endometrial D&C complicated ultimately by uterine perforation and sigmoid injury status post exploratory laparotomy descending and  colostomy partial colectomy on July 2nd. Prior to that in May she went laparoscopic cholecystectomy for gallstone pancreatitis and cholecystitis.  Is had several cultures that have been taken from the wound but are not deep cultures with multiple multidrug-resistant organisms isolated.   She now has had a deep culture taken from the abdomen which is yielded E. coli Pseudomonas aeruginosa's Bacteroides species that are beta-lactamase positive   #1 Intrabdominal abscess:  I think we can use meropenem  to cover the organisms isolated from her abscess culture and plan on completing tentatively a month of antibiotics from when the abscess was aspirated  I would also strongly recommend repeat CT prior to her stopping antibiotics  I do not think she needs further zyvox . She has had more than 2 weeks and the AMP R E faecalis could just be a bystander collected on wound culture. It was NOT ID from abscess culture  There is concern for anastomotic eak.  Will defer to the primary team with regards to workup for that  #2 Standard precautions  I have personally spent 50 minutes involved in face-to-face and non-face-to-face activities for this patient on the day of the visit. Professional time spent includes the following activities: Preparing to see the patient you have microdata CBC BMP obtaining and/or reviewing separately obtained history operative notes progress notes consultative notes, Performing a medically appropriate examination and/or evaluation , Ordering medications/tests/procedures, referring and communicating with other health care professionals, Documenting clinical information in the EMR, Independently interpreting results (not separately reported), Communicating results to the patient/family/caregiver, Counseling and educating the patient/family/caregiver and Care coordination (not separately reported).   Evaluation of the patient requires complex antimicrobial therapy evaluation,  counseling , isolation needs to reduce disease transmission and risk assessment and mitigation.   I will sign off for now.  Please call with further questions.   LOS: 27 days   Jomarie [name] 03/27/2024, 9:46 AM

## 2024-03-27 NOTE — Plan of Care (Signed)
  Problem: Clinical Measurements: Goal: Will remain free from infection Outcome: Progressing Goal: Diagnostic test results will improve Outcome: Progressing Goal: Respiratory complications will improve Outcome: Progressing Goal: Cardiovascular complication will be avoided Outcome: Progressing   Problem: Elimination: Goal: Will not experience complications related to bowel motility Outcome: Progressing   Problem: Pain Managment: Goal: General experience of comfort will improve and/or be controlled Outcome: Progressing   Problem: Safety: Goal: Ability to remain free from injury will improve Outcome: Progressing   Problem: Skin Integrity: Goal: Risk for impaired skin integrity will decrease Outcome: Progressing   Problem: Coping: Goal: Ability to adjust to condition or change in health will improve Outcome: Progressing   Problem: Skin Integrity: Goal: Risk for impaired skin integrity will decrease Outcome: Progressing   Problem: Tissue Perfusion: Goal: Adequacy of tissue perfusion will improve Outcome: Progressing   Problem: Education: Goal: Knowledge of the prescribed therapeutic regimen will improve Outcome: Progressing   Problem: Skin Integrity: Goal: Demonstration of wound healing without infection will improve Outcome: Progressing   Problem: Respiratory: Goal: Ability to maintain a clear airway and adequate ventilation will improve Outcome: Progressing   Problem: Role Relationship: Goal: Method of communication will improve Outcome: Progressing

## 2024-03-27 NOTE — Progress Notes (Signed)
 Calorie Count Note  Ongoing calorie count ordered.  Diet: Carb Modified Supplements: [employer] 1.4 TID  7/18: Breakfast: 0% Lunch: 0% Dinner: 0% -had a KF Supplements: 2 [employer] = 910 kcals, 40g protein Total intake: 910 kcal (52% of minimum estimated needs)  40g protein (44% of minimum estimated needs)   7/19: Breakfast: 2 bites of oatmeal Lunch: 0% -had a KF Dinner: 0% -had a KF Supplements: 3 [employer] = 1365 kcals, 60g protein Total intake: 1365 kcal (78% of minimum estimated needs)  60g protein (66% of minimum estimated needs)   7/20: Breakfast: 3 bites of oatmeal, 1 bite of banana ~20 kcals Lunch: 0% Dinner: 0% -had a KF Supplements: 2 [employer] = 910 kcals, 40g protein Total intake: 930 kcal (53% of minimum estimated needs)  40g protein (44% of minimum estimated needs)  7/21: Breakfast: 1/2 bowl of oatmeal =70 kcals, 2 g protein  Lunch: 0%  Dinner: 0%  Supplements: none  7/24: Breakfast: refused Lunch: refused Dinner: refused Supplements: 2 [employer] = 910 kcals, 40g protein  7/22: Breakfast: 215 kcals, 2g protein Lunch: 0% Dinner: 0% Supplements: 2 [employer] = 910 kcals, 40g protein Total intake: 1125 kcal (64% of minimum estimated needs)  42g protein (46% of minimum estimated needs)  7/25: Breakfast: 25% = 150 kcals, 7g protein Lunch: Nothing documented Dinner: 25% = 136 kcals, 6g protein Supplements: 2 [employer] = 910 kcals, 40g protein Total intake: 1196 kcal (68% of minimum estimated needs)  53 protein (59% of minimum estimated needs)  7/26: Breakfast: 50% = 75 kcals, 2g protein Lunch: 75% = 499 kcals, 28g protein Dinner: 20% = 120 kcals, 5g protein Supplements: 3 [employer] = 1365 kcals, 60g protein Total intake: 2059 kcal (>100% of minimum estimated needs)  95 protein (100% of minimum estimated needs)  7/27: Breakfast: nothing documented Lunch: 20% = 12 kcal, 0g protein Dinner: 15% = 87 kcals, 4g protein Supplements:  3 [employer] = 1365 kcals, 60g protein Total intake: 1464 kcal (83% of minimum estimated needs)  64 protein (71% of minimum estimated needs)  Nutrition Dx: Severe Malnutrition related to acute illness as evidenced by energy intake < or equal to 50% for > or equal to 5 days, mild muscle depletion, percent weight loss (10% in 1.5 months).   Goal: Patient will meet greater than or equal to 90% of their needs   Subjective: Calorie count was continued through the weekend. Patient does seem to be consuming slightly more, however concerned based on full 9 days worth of calorie count patient is not consistent. She has only met ~100% of her needs 1 day. An average of the 9 days shows patient consuming 1114 kcals (63% of needs) and 48g protein (54% of needs). She has consistently been meeting <75% of her needs and suspect she would not be able to appropriately heal solely with oral intake. She also refused both breakfast and lunch today. Discussed results with attending MD from GYN Onc. Discussed recommendation for Cortrak/tube feeds however per MD, patient is not agreeable to Cortrak.  Recommended discontinuation of calorie count as patient has shown she cannot consistently consume adequate oral intake but refusing intervention to help with this. MD on board to discontinue calorie count. Will continue to follow and encourage oral intake/intake of supplements.    Intervention:  - Carb Modified diet per MD.   [place] [employer] 1.4 PO TID, each provides 455 kcals and 20g protein  - If patient becomes agreeable  to Cortrak/tube feeds, would recommend:             - [employer] 1.4 ENT via tube at 55 ml/hr             -Provides 1848 kcals, 81g protein and 937 ml H2O   Carrin Vannostrand Debby RD, LDN Contact via Secure Chat.

## 2024-03-27 NOTE — Consult Note (Signed)
 WOC Nurse wound follow up Wound type: Surgical abd midline. Bed nurse requested a follow-up visit regarding a leak in the VAC dressing. At the consult, notice the plug was loose, without the drape that protect the edges.  I reinforce it with more transparent film as Tegaderm. No more signs of leaking. Machine was functioning normally. There is no need to change the dressing at this time.  Please reconsult if further assistance is needed. Thank-you,  Lela Holm BSN, CNS, RN, [employer], WOCN  (Phone [phone])

## 2024-03-27 NOTE — Progress Notes (Signed)
 OT Cancellation Note  Patient Details Name: Alexandria Durham MRN: [id number] DOB: August 26, 1946   Cancelled Treatment:    Reason Eval/Treat Not Completed: Pain limiting ability to participate;Medical issues which prohibited therapy: Attempted in early AM. Pt too somnolent to sufficiently arouse for OT despite multiple attempts.  Checked back lunch time and pt with RNs who request OT to hold off today as pt in too much pain.   Will hold today per RN request and continue efforts at a later date as able.  Delon Falter 03/27/2024, 12:06 PM

## 2024-03-27 NOTE — Progress Notes (Signed)
 Patients tray was delivered during wound care and ostomy care. However, patient was in pain afterwards (medication administered). Patient declined the meal, but ate two grapes that came on her tray. Patient says that she needs assistance with eating some meals, but her daughter helps her whenever her daughter is here. Patient has mentioned not having an appetite today.   Patient requested a warm cloth to wipe herself off and has opted to rest.

## 2024-03-27 NOTE — Progress Notes (Signed)
 GYN Oncology Progress Note  26 Days Post-Op Procedure(s) (LRB): LAPAROTOMY, EXPLORATORY (N/A) SIGMOIDOSCOPY COLECTOMY, WITH COLOSTOMY CREATION MOBILIZATION, SPLENIC FLEXURE, WITH PARTIAL COLECTOMY CYSTOSCOPY  Subjective: Patient reports having moderate abdominal discomfort after dressing change earlier today. She received pain medication prior to this but states this did not help much. Does not feel like eating today due to the pain but has been sipping on supplemental shakes. Was unable to work with OT this am due to abdominal pain.   Objective: Vital signs in last 24 hours: Temp:  [98.6 F (37 C)-98.7 F (37.1 C)] 98.7 F (37.1 C) (07/28 0503) Pulse Rate:  [93-98] 93 (07/28 0503) Resp:  [18] 18 (07/28 0503) BP: (107-126)/(63-70) 126/70 (07/28 0503) SpO2:  [95 %-98 %] 98 % (07/28 0503) Weight:  [155 lb 10.3 oz (70.6 kg)] 155 lb 10.3 oz (70.6 kg) (07/28 0500) Last BM Date : 03/26/24  Intake/Output from previous day: 07/27 0701 - 07/28 0700 In: 2506.5 [P.O.:1020; I.V.:1194.4; IV Piggyback:282.1] Out: 2521 [Urine:2200; Drains:170; Stool:151]  Physical Examination: General: opening eyes intermittently, grimacing intermittently due to abdominal discomfort, no acute distress, in bed, oriented when awake Respiratory: Normal work of breathing. Lungs clear to auscultation bilaterally without rhonchi. Cardiovascular: Regular sinus rhythm and rate, no murmurs or rubs. Abdomen: soft, mildly distended, + BS, tender to palpation diffusely (stable). Incision: wound VAC in place to suction- cloudy tan drainage in the canister. Left paracolic gutter drain with purulent drainage. Dry dressing around drain. Ostomy viable, bag with soft stool.    Extremities: extremities warm and well perfused, tenderness with movement of left LE improving Gu: Purewick in place with yellow urine  Labs: Drain culture from 7/24 - few e coli, rare pseudomonas, rare bacteroides thetaiotaomicron and bacteroides  caccae  Assessment: 78 y.o. s/p Procedure(s): LAPAROTOMY, EXPLORATORY SIGMOIDOSCOPY COLECTOMY, WITH COLOSTOMY CREATION MOBILIZATION, SPLENIC FLEXURE, WITH PARTIAL COLECTOMY CYSTOSCOPY: working on increasing PO intake, physical conditioning   Post-op: -plan to stop negative pressure wound therapy on Thurs, March 30, 2024 with transition to twice daily wet to dry dressing changes. Per WO RN, wet to dry would be appropriate at this time.  -Appreciate ID input. Starting discussions about plan for long term antibiotics at discharge, SNF plan. -continue to work on being up to chair, out of bed with assistance. PT/OT - poor PO intake since TPN discontinued on 7/21. Drinking 1-2 nutrition shakes daily. Last week, possibility of tube feeds if she is not able to continue to increase intake discussed-this is on hold at this time. - continue Remeron  - having bowel function although low output in setting of poor PO intake, slowly improving - purewick in place, minimal UOP overnight, improved this morning.     Intra-abdominal infection: 7/10 culture with e coli and enterococcus 7/14 incision culture with e coli and pseudomonas  7/24 culture from IR drain: ecoli and pseudomonas, both sensitive to imipenem.  Current antibiotic regimen: meropenem  (7/17). Linezolid  (started 7/14) discontinued today per ID rec. - CT on 7/23 with 8 cm left para-colic gutter fluid collection concerning for abscess. IR drain placement on 7/24 (removed JP from surgery at the time of this drain placement) - ID following. Appreciate their recommendations - leukocytosis resolved   Severe calorie-deficient nutrition: Continue to provide encouragement - continue TID supplements - calorie counts to discontinue after discussion with Dr. [name] and RD. - consistency of intake continues to be a challenge - continue with intake and output   Hyponatremia: - asymptomatic, stable   Acute on chronic anemia: -  asymptomatic, continue  to monitor   Thickened endometrium: - failed endometrial sampling on multiple attempts (hysteroscopic and under intra-abdominal visualization) - no definitive malignancy or hyperplasia diagnosis   Type 2 diabetes mellitus:  - CBGs 105-149 in last 24 hours - continue insulin  sliding scale    Prophylaxis:  - SCDs, lovenox    LOS: 27 days    Kannon Baum D Kareena Arrambide 03/27/2024, 2:14 PM

## 2024-03-27 NOTE — Progress Notes (Signed)
 PT Cancellation Note  Patient Details Name: Alexandria Durham MRN: [id number] DOB: 31-Dec-1945   Cancelled Treatment:    Reason Eval/Treat Not Completed: Pain limiting ability to participate with PT today. PT to continue to follow acutely.   [place], PT Acute Rehab   [place] VEAR Drone 03/27/2024, 2:12 PM

## 2024-03-27 NOTE — Progress Notes (Signed)
 Referring Provider(s): Viktoria POUR  Supervising Physician: Johann Sieving  Patient Status:  [facility] - In-pt  Chief Complaint:  Left paracolic abscess status post left paracolic abscess drain placement 03/23/2024 Dr. Karalee  Subjective:  Pt states she is having no pain at drain site.  All pt questions answered.    Allergies: Ace inhibitors, Tape, Aspirin, and Codeine  Medications: Prior to Admission medications   Medication Sig Start Date End Date Taking? Authorizing Provider  bisacodyl  5 MG EC tablet Take 5 mg by mouth daily as needed for moderate constipation.   Yes [provider]  buPROPion  (WELLBUTRIN  XL) 150 MG 24 hr tablet Take 150 mg by mouth every evening.   Yes [provider]  Cholecalciferol  (VITAMIN D3) 25 MCG (1000 UT) CAPS Take 1,000 Units by mouth daily.   Yes [provider]  Cyanocobalamin  (VITAMIN B-12 PO) Take 1 tablet by mouth in the morning.   Yes [provider]  diphenhydramine -acetaminophen  (TYLENOL  PM) 25-500 MG TABS tablet Take 2 tablets by mouth at bedtime as needed (Sleep/Pain).   Yes [provider]  ezetimibe  (ZETIA ) 10 MG tablet Take 10 mg by mouth daily. 10/29/23 10/28/24 Yes [provider]  ferrous sulfate 325 (65 FE) MG tablet Take 325 mg by mouth daily with breakfast.   Yes [provider]  fluticasone  (FLONASE ) 50 MCG/ACT nasal spray Place 2 sprays into both nostrils daily as needed for rhinitis. 02/15/24  Yes Cheryle Page, MD  hydrochlorothiazide  (HYDRODIURIL ) 25 MG tablet Take 1 tablet (25 mg total) by mouth daily. 01/10/24  Yes Barbarann Nest, MD  levothyroxine  (SYNTHROID ) 75 MCG tablet Take 75 mcg by mouth daily before breakfast. 09/22/22  Yes [provider]  losartan  (COZAAR ) 50 MG tablet Take 50 mg by mouth in the morning.   Yes [provider]  magnesium  oxide (MAG-OX) 400 MG tablet Take 400 mg by mouth daily. 03/01/17  Yes [provider]   methocarbamol  (ROBAXIN ) 500 MG tablet Take 1 tablet (500 mg total) by mouth 2 (two) times daily as needed for muscle spasms. 05/25/21  Yes Joldersma, Logan, PA-C  mometasone (ELOCON) 0.1 % cream Apply 1 Application topically daily as needed (Dermatitis- affected sites). 10/27/23  Yes [provider]  omeprazole (PRILOSEC) 40 MG capsule Take 40 mg by mouth daily before breakfast.   Yes [provider]  polycarbophil (FIBERCON) 625 MG tablet Take 1 tablet (625 mg total) by mouth 2 (two) times daily. 01/09/24  Yes Barbarann Nest, MD  polyethylene glycol powder (GLYCOLAX /MIRALAX ) 17 GM/SCOOP powder Take 17 g by mouth 2 (two) times daily as needed for moderate constipation. 08/15/15  Yes [provider]  sitaGLIPtin (JANUVIA) 50 MG tablet Take 50 mg by mouth daily. 10/28/22  Yes [provider]  triamcinolone ointment (KENALOG) 0.5 % Apply 1 Application topically 2 (two) times daily as needed (for itching).   Yes [provider]  TYLENOL  8 HOUR ARTHRITIS PAIN 650 MG CR tablet Take 650-1,300 mg by mouth every 8 (eight) hours as needed for pain.   Yes [provider]  ondansetron  (ZOFRAN ) 4 MG tablet Take 1 tablet (4 mg total) by mouth every 6 (six) hours as needed for nausea. 02/15/24   Cheryle Page, MD     Vital Signs: BP 126/70 (BP Location: Left Arm)   Pulse 93   Temp 98.7 F (37.1 C) (Oral)   Resp 18   Ht 5' 4 (1.626 m)   Wt 155 lb 10.3 oz (70.6 kg)  SpO2 98%   BMI 26.72 kg/m   Physical Exam Constitutional:      Appearance: Normal appearance.  Skin:    Comments: LLQ drain F/A easily; statlock/suture in place  Skin clean, dry, without signs of infection   Output 15 mL, white/thin in JP  Neurological:     Mental Status: She is alert and oriented to person, place, and time.  Psychiatric:        Behavior: Behavior normal.      Labs:  CBC: Recent Labs    03/20/24 0858 03/22/24 0812 03/24/24 0540 03/26/24 1313  WBC 8.9  10.1 10.4 7.4  HGB 8.1* 8.7* 8.9* 8.7*  HCT 25.4* 27.5* 27.1* 27.1*  PLT 535* 584* 605* 601*    COAGS: No results for input(s): INR, APTT in the last 8760 hours.  BMP: Recent Labs    03/22/24 0812 03/24/24 0540 03/25/24 1221 03/26/24 1313  NA 131* 130* 132* 132*  K 4.5 4.2 4.2 4.3  CL 99 96* 98 97*  CO2 25 25 24 25   GLUCOSE 157* 96 174* 113*  BUN 18 16 17 15   CALCIUM  9.1 9.1 8.9 8.9  CREATININE 0.92 0.92 0.97 1.09*  GFRNONAA >60 >60 >60 53*    LIVER FUNCTION TESTS: Recent Labs    03/16/24 0438 03/17/24 0711 03/20/24 0525 03/26/24 1313  BILITOT 0.7 0.5 0.6 0.3  AST 48* 59* 44* 31  ALT 86* 85* 68* 41  ALKPHOS 126 129* 100 88  PROT 6.3* 6.4* 6.0* 7.0  ALBUMIN  1.8* 1.8* 1.7* 2.1*    Assessment and Plan:  Left paracolic abscess status post left paracolic abscess drain placement 03/23/2024 Dr. Karalee Ribas Location: LLQ Size: Fr size: 10 Fr Date of placement: 03/23/24  Currently to: Drain collection device: suction bulb 24 hour output:  Output by Drain (mL) 03/25/24 0701 - 03/25/24 1900 03/25/24 1901 - 03/26/24 0700 03/26/24 0701 - 03/26/24 1900 03/26/24 1901 - 03/27/24 0700 03/27/24 0701 - 03/27/24 1259  Closed System Drain 1 LLQ 10 Fr. 20 10 20 25 10   Negative Pressure Wound Therapy Abdomen Medial;Mid  50 50 75 250    Interval imaging/drain manipulation:  None   Current examination: Flushes/aspirates easily.  Insertion site unremarkable. Suture and stat lock in place. Dressed appropriately.   Plan: Continue TID flushes with 5 cc NS. Record output Q shift. Dressing changes QD or PRN if soiled.  Call IR APP or on call IR MD if difficulty flushing or sudden change in drain output.  Repeat imaging/possible drain injection once output < 10 mL/QD (excluding flush material). Consideration for drain removal if output is < 10 mL/QD (excluding flush material), pending discussion with the providing surgical service.  Discharge planning: Please contact  IR APP or on call IR MD prior to patient d/c to ensure appropriate follow up plans are in place. Typically patient will follow up with IR clinic 10-14 days post d/c for repeat imaging/possible drain injection. IR scheduler will contact patient with date/time of appointment. Patient will need to flush drain QD with 5 cc NS, record output QD, dressing changes every 2-3 days or earlier if soiled.   IR will continue to follow - please call with questions or concerns.  Electronically Signed: Lavanda JAYSON Jurist, PA-C 03/27/2024, 12:59 PM    I spent a total of 15 Minutes at the the patient's bedside AND on the patient's hospital floor or unit, greater than 50% of which was counseling/coordinating care for Left paracolic abscess status post left paracolic abscess  drain placement 03/23/2024 Dr. Karalee.

## 2024-03-28 LAB — GLUCOSE, CAPILLARY
Glucose-Capillary: 91 mg/dL (ref 70–99)
Glucose-Capillary: 100 mg/dL — ABNORMAL HIGH (ref 70–99)
Glucose-Capillary: 96 mg/dL (ref 70–99)
Glucose-Capillary: 145 mg/dL — ABNORMAL HIGH (ref 70–99)
Glucose-Capillary: 90 mg/dL (ref 70–99)

## 2024-03-28 MED ORDER — SODIUM CHLORIDE 0.9 % IV SOLN
1.0000 g | Freq: Two times a day (BID) | INTRAVENOUS | Status: DC
  Administered 2024-03-28 – 2024-04-04 (×14): 1 g via INTRAVENOUS
  Filled 2024-03-28 (×15): qty 20

## 2024-03-28 NOTE — Progress Notes (Addendum)
 GYN Oncology Progress Note  27 Days Post-Op Procedure(s) (LRB): LAPAROTOMY, EXPLORATORY (N/A) SIGMOIDOSCOPY COLECTOMY, WITH COLOSTOMY CREATION MOBILIZATION, SPLENIC FLEXURE, WITH PARTIAL COLECTOMY CYSTOSCOPY  Subjective: Patient reports doing well today. She did not eat breakfast but for lunch she had a few bites of beef, all of her mashed potatoes, all of her corn and peaches. No nausea or emesis. She got out of bed with PT and ambulated a short distance in the hall. She only received pain medication prior to working with PT and has not needed any since. Pt talking about going home. Stating she will not have 24 hour care and she is still unable to sit up or ambulate by herself. We discussed the care she would need at discharge including twice daily wound changes and IV antibiotics. We also discussed the purpose of rehab.    Objective: Vital signs in last 24 hours: Temp:  [98 F (36.7 C)-98.4 F (36.9 C)] 98.4 F (36.9 C) (07/29 1259) Pulse Rate:  [84-99] 99 (07/29 1259) Resp:  [16-18] 16 (07/29 1259) BP: (123-143)/(70-82) 123/77 (07/29 1259) SpO2:  [96 %-97 %] 96 % (07/29 1259) Weight:  [152 lb 1.9 oz (69 kg)] 152 lb 1.9 oz (69 kg) (07/29 0500) Last BM Date : 03/27/24  Intake/Output from previous day: 07/28 0701 - 07/29 0700 In: 705 [P.O.:650; I.V.:40] Out: 1700 [Urine:1250; Drains:350; Stool:100]  Physical Examination: General: Alert, oriented, in no acute distress, in bed, comfortable Respiratory: Normal work of breathing. Lungs clear to auscultation bilaterally without rhonchi. Cardiovascular: Regular sinus rhythm and rate, no murmurs or rubs. Abdomen: soft, mildly distended, + BS, less tender to palpation diffusely (stable). Incision: wound VAC in place to suction- cloudy tan-red drainage in the canister. Left paracolic gutter drain with purulent red-tan drainage. Dry dressing around drain. Ostomy viable, bag with soft stool, no flatus.    Extremities: extremities warm and  well perfused, no edema Gu: Purewick in place with yellow urine  Labs: Drain culture from 7/24 - few e coli, rare pseudomonas, rare bacteroides thetaiotaomicron and bacteroides caccae  Assessment: 78 y.o. s/p Procedure(s): LAPAROTOMY, EXPLORATORY SIGMOIDOSCOPY COLECTOMY, WITH COLOSTOMY CREATION MOBILIZATION, SPLENIC FLEXURE, WITH PARTIAL COLECTOMY CYSTOSCOPY: working on increasing PO intake, physical conditioning   Post-op: -plan to stop negative pressure wound therapy on Thurs, March 30, 2024 with transition to twice daily wet to dry dressing changes. Per WO RN, wet to dry would be appropriate at this time.  -Appreciate ID input. Starting discussions about plan for long term antibiotics at discharge, SNF plan. -continue to work on being up to chair, out of bed with assistance. PT/OT -Improved intake today. Has had poor PO intake since TPN discontinued on 7/21.  - continue Remeron  - having bowel function  - purewick in place with adequate output -At discharge to SNF, patient will need twice daily wet to dry dressing changes to the midline abdominal incision and will need to continue IV antibiotics (until Apr 20, 2024).   Intra-abdominal infection: 7/10 culture with e coli and enterococcus 7/14 incision culture with e coli and pseudomonas  7/24 culture from IR drain: ecoli and pseudomonas, both sensitive to imipenem.  Current antibiotic regimen: meropenem  (7/17). Linezolid  (started 7/14) discontinued today per ID rec. - CT on 7/23 with 8 cm left para-colic gutter fluid collection concerning for abscess. IR drain placement on 7/24 (removed JP from surgery at the time of this drain placement) - ID following. Appreciate recommendations. Per Dr. [name], recommendation to continue with meropenem  at discharge - leukocytosis resolved,  last CBC 7/27   Severe calorie-deficient nutrition: Continue to provide encouragement. Improved intake today. - continue TID supplements - calorie counts to  discontinue after discussion with Dr. [name] and RD. - consistency of intake continues to be a challenge - continue with intake and output   Hyponatremia: - asymptomatic, stable   Acute on chronic anemia: - asymptomatic, continue to monitor   Thickened endometrium: - failed endometrial sampling on multiple attempts (hysteroscopic and under intra-abdominal visualization) - no definitive malignancy or hyperplasia diagnosis   Type 2 diabetes mellitus:  - CBGs 100-91 in last 24 hours - continue insulin  sliding scale    Prophylaxis:  - SCDs, lovenox    LOS: 28 days    Alexandria Durham 03/28/2024, 4:03 PM

## 2024-03-28 NOTE — Progress Notes (Signed)
 Physical Therapy Treatment Patient Details Name: Alexandria Durham MRN: [id number] DOB: [birth year] Today's Date: 03/28/2024   History of Present Illness Patient is a 78 year old female who presented on 03/01/2024 following a D and C on 7/1 for thickened endometrium and procedure complicated due to bowel perforation. Pt developed signs and symptoms of sepsis and shock. 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  Pt required post op ventilation and extubated on 7/4. Pt had removal of surgical drain and s/p left paracolic abscess drain placement on 03/23/24 by IR. Pt PMH includes but is not limited to:  recent hospital admissions for diverticulitis, pancreatitis, abdominal pain, D&C  01/06/24 with uterine perforation, cholecystectomy and d&C on 01/17/24, DM II, HTN, COPD, GERD and CKD III.    PT Comments   Patient was premedicated with IV dilaudid   prior to therapy.  Patient is alert, conversant with therapists , and ready to get OOB.  Patient required much less assistance. Patient able to move to sitting with increased time with min  assistance, HOB raised and use rail, then stood at [facility] and  then ambulated x 90' using RW and min asist. Patient demonstrated much progress this visit with no  reports of pain except 1 x cramp.  Continue PT. Patient will benefit from continued inpatient follow up therapy, <3 hours/day    If plan is discharge home, recommend the following: A little help with walking and/or transfers;A little help with bathing/dressing/bathroom;Assistance with cooking/housework;Assist for transportation;Help with stairs or ramp for entrance   Can travel by private vehicle        Equipment Recommendations  Rolling walker (2 wheels)    Recommendations for Other Services       Precautions / Restrictions Precautions Precautions: Fall Recall of Precautions/Restrictions: Intact Precaution/Restrictions Comments: Abdominal sx - wound vac, JP drain, ostomy Required Braces or  Orthoses: Other Brace Other Brace: abd binder when out of bed (it was in  her bedside drwer Restrictions Other Position/Activity Restrictions: wound vac, JP drain, ostomy     Mobility  Bed Mobility   Bed Mobility: Rolling, Sidelying to Sit Rolling: Contact guard assist Sidelying to sit: Min assist       General bed mobility comments: slowly rolled to each side with no assistance, able to slowly reach for rail. Patient slowly pushed up to sitting  from right  sidelying with min assist    Transfers Overall transfer level: Needs assistance Equipment used: Rolling walker (2 wheels) Transfers: Sit to/from Stand Sit to Stand: Contact guard assist           General transfer comment: patient stood from raised bed at RW, no extra support/ CGA.    Ambulation/Gait Ambulation/Gait assistance: Min assist, +2 safety/equipment Gait Distance (Feet): 90 Feet Assistive device: Rolling walker (2 wheels) Gait Pattern/deviations: Step-through pattern Gait velocity: decreased     General Gait Details: patient ambulated using RW   and  noted Left knee buckled x 1   Stairs             Wheelchair Mobility     Tilt Bed    Modified Rankin (Stroke Patients Only)       Balance Overall balance assessment: Needs assistance Sitting-balance support: No upper extremity supported, Feet supported Sitting balance-Leahy Scale: Good     Standing balance support: Bilateral upper extremity supported, During functional activity, Reliant on assistive device for balance Standing balance-Leahy Scale: Poor  Communication Communication Communication: No apparent difficulties  Cognition Arousal: Alert Behavior During Therapy: WFL for tasks assessed/performed   PT - Cognitive impairments: No apparent impairments                       PT - Cognition Comments: pt alert, talkative, cheerful, readily   mobilized Following commands:  Intact Following commands impaired: Only follows one step commands consistently    Cueing Cueing Techniques: Verbal cues  Exercises      General Comments        Pertinent Vitals/Pain Pain Assessment Pain Assessment: Faces Faces Pain Scale: Hurts little more Pain Location: abdomen, one episode of a cramp after ambulating Pain Descriptors / Indicators: Cramping Pain Intervention(s): Monitored during session, Premedicated before session    Home Living                          Prior Function            PT Goals (current goals can now be found in the care plan section) Progress towards PT goals: Progressing toward goals    Frequency           PT Plan      Co-evaluation PT/OT/SLP Co-Evaluation/Treatment: Yes Reason for Co-Treatment: Complexity of the patient's impairments (multi-system involvement);For patient/therapist safety;To address functional/ADL transfers PT goals addressed during session: Mobility/safety with mobility;Balance;Proper use of DME OT goals addressed during session: ADL's and self-care;Proper use of Adaptive equipment and DME      AM-PAC PT 6 Clicks Mobility   Outcome Measure  Help needed turning from your back to your side while in a flat bed without using bedrails?: None Help needed moving from lying on your back to sitting on the side of a flat bed without using bedrails?: A Little   Help needed standing up from a chair using your arms (e.g., wheelchair or bedside chair)?: A Little Help needed to walk in hospital room?: A Little Help needed climbing 3-5 steps with a railing? : Total 6 Click Score: 14    End of Session Equipment Utilized During Treatment: Gait belt;Other (comment) (abd binder) Activity Tolerance: Patient tolerated treatment well Patient left: in chair;with call bell/phone within reach;with chair alarm set Nurse Communication: Mobility status PT Visit Diagnosis: Difficulty in walking, not elsewhere classified  (R26.2);Muscle weakness (generalized) (M62.81);Pain     Time: 1300-1330 PT Time Calculation (min) (ACUTE ONLY): 30 min  Charges:    $Gait Training: 8-22 mins PT General Charges $$ ACUTE PT VISIT: 1 Visit                     Darice Potters PT Acute Rehabilitation Services Office [phone]   Potters Darice Norris 03/28/2024, 2:07 PM

## 2024-03-28 NOTE — Progress Notes (Signed)
 Occupational Therapy Treatment Patient Details Name: Alexandria Durham MRN: [id number] DOB: 1946/03/02 Today's Date: 03/28/2024   History of present illness Patient is a 78 year old female who presented on 03/01/2024 following a D and C on 7/1 for thickened endometrium and procedure complicated due to bowel perforation. Pt developed signs and symptoms of sepsis and shock. 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  Pt required post op ventilation and extubated on 7/4. Pt had removal of surgical drain and s/p left paracolic abscess drain placement on 03/23/24 by IR. Pt PMH includes but is not limited to:  recent hospital admissions for diverticulitis, pancreatitis, abdominal pain, D&C  01/06/24 with uterine perforation, cholecystectomy and d&C on 01/17/24, DM II, HTN, COPD, GERD and CKD III.   OT comments  Patient seen for skilled OT session this afternoon. Excellent progression with basic mobility and activity tolerance for light ADL's as outlined below. Very low pain post-medication. Patient requires continued Acute care hospital level OT services to progress safety and functional performance and allow for discharge. Patient will benefit from continued inpatient follow up therapy, <3 hours/day.         If plan is discharge home, recommend the following:  Assistance with cooking/housework;Assist for transportation;Help with stairs or ramp for entrance;A lot of help with bathing/dressing/bathroom;A lot of help with walking and/or transfers   Equipment Recommendations  Other (comment)    Recommendations for Other Services      Precautions / Restrictions Precautions Precautions: Fall Recall of Precautions/Restrictions: Intact Precaution/Restrictions Comments: Abdominal sx - wound vac, JP drain, ostomy Required Braces or Orthoses: Other Brace Other Brace: abd binder when out of bed (it was in  her bedside drwer Restrictions Weight Bearing Restrictions Per Provider Order: No Other  Position/Activity Restrictions: wound vac, JP drain, ostomy       Mobility Bed Mobility Overal bed mobility: Needs Assistance Bed Mobility: Rolling, Sidelying to Sit Rolling: Contact guard assist Sidelying to sit: Min assist Supine to sit: Mod assist, Used rails, HOB elevated   Sit to sidelying: Used rails, Max assist, +2 for physical assistance General bed mobility comments: slowly rolled to each side with no assistance, able to slowly reach for rail. Patient slowly pushed up to sitting  from right  sidelying.    Transfers Overall transfer level: Needs assistance Equipment used: Rolling walker (2 wheels) Transfers: Sit to/from Stand Sit to Stand: Contact guard assist           General transfer comment: patient stood from raised bed at RW, no extra support, amb out to hallway and back 90 ft     Balance Overall balance assessment: Needs assistance Sitting-balance support: No upper extremity supported, Feet supported Sitting balance-Leahy Scale: Good Sitting balance - Comments: mod progressing to CGA with time.   Standing balance support: Bilateral upper extremity supported, During functional activity, Reliant on assistive device for balance Standing balance-Leahy Scale: Poor Standing balance comment: B UE support at RW and min A x 2 for safety and balance                           ADL either performed or assessed with clinical judgement   ADL Overall ADL's : Needs assistance/impaired Eating/Feeding: Set up;Sitting   Grooming: Wash/dry hands;Wash/dry face;Sitting   Upper Body Bathing: Moderate assistance Upper Body Bathing Details (indicate cue type and reason): max for abd binder Lower Body Bathing: Maximal assistance;Sitting/lateral leans   Upper Body Dressing : Moderate  assistance;Sitting   Lower Body Dressing: Maximal assistance       Toileting- Clothing Manipulation and Hygiene: Maximal assistance       Functional mobility during ADLs: Minimal  assistance;Rolling walker (2 wheels) General ADL Comments: wound vac, drain, lower activity tolerance    Extremity/Trunk Assessment Upper Extremity Assessment Upper Extremity Assessment: Right hand dominant RUE Deficits / Details: patient reports hx of RTC issues with ~ 0-55 degrees active shoulder ROM, enough to reach face with wash cloth and complete full grasp ~ 4-/5 distal gross strength RUE Coordination: decreased gross motor;decreased fine motor LUE Deficits / Details: patient reports hx of L RTC issues with ~ 0-20 degrees shoulder AROM with 3/5 gross grasp stregnth, unable to reach to face LUE Coordination: decreased fine motor;decreased gross motor   Lower Extremity Assessment Lower Extremity Assessment: Defer to PT evaluation        Vision   Vision Assessment?: No apparent visual deficits         Communication Communication Communication: No apparent difficulties Factors Affecting Communication: Reduced clarity of speech   Cognition Arousal: Alert Behavior During Therapy: WFL for tasks assessed/performed Cognition: Cognition impaired   Orientation impairments: Time Awareness: Online awareness impaired Memory impairment (select all impairments): Short-term memory Attention impairment (select first level of impairment): Divided attention Executive functioning impairment (select all impairments): Problem solving OT - Cognition Comments: Followed 1 step commands ~80% of the time. Iproved overall alertness, attention, and cognitive clarity                 Following commands: Intact Following commands impaired: Only follows one step commands consistently      Cueing   Cueing Techniques: Verbal cues        General Comments RA, no SOB, wound vac and drain intact and abdominal binder tolerated    Pertinent Vitals/ Pain       Pain Assessment Pain Assessment: Faces Faces Pain Scale: Hurts a little bit Pain Location: abdomen, one episode of a cramp after  ambulating Pain Descriptors / Indicators: Cramping Pain Intervention(s): Limited activity within patient's tolerance, Monitored during session, Premedicated before session, Relaxation   Frequency  Min 2X/week        Progress Toward Goals  OT Goals(current goals can now be found in the care plan section)  Progress towards OT goals: Progressing toward goals;Goals updated  Acute Rehab OT Goals Patient Stated Goal: to keep waling OT Goal Formulation: With patient Time For Goal Achievement: 04/07/24 Potential to Achieve Goals: Good ADL Goals Pt Will Perform Eating: with set-up;sitting Pt Will Perform Grooming: with set-up;sitting Pt Will Perform Upper Body Bathing: with set-up;sitting Pt Will Perform Upper Body Dressing: with min assist;sitting Pt Will Transfer to Toilet: with min assist;bedside commode Pt/caregiver will Perform Home Exercise Program: Both right and left upper extremity;With Supervision;With written HEP provided;With theraputty  Plan      Co-evaluation    PT/OT/SLP Co-Evaluation/Treatment: Yes Reason for Co-Treatment: Complexity of the patient's impairments (multi-system involvement);For patient/therapist safety;To address functional/ADL transfers PT goals addressed during session: Mobility/safety with mobility;Balance;Proper use of DME OT goals addressed during session: ADL's and self-care;Proper use of Adaptive equipment and DME      AM-PAC OT 6 Clicks Daily Activity     Outcome Measure   Help from another person eating meals?: None Help from another person taking care of personal grooming?: A Little Help from another person toileting, which includes using toliet, bedpan, or urinal?: A Lot Help from another person bathing (including washing, rinsing, drying)?:  A Lot Help from another person to put on and taking off regular upper body clothing?: A Little Help from another person to put on and taking off regular lower body clothing?: A Lot 6 Click  Score: 16    End of Session Equipment Utilized During Treatment: Other (comment);Rolling walker (2 wheels);Gait belt (abdominal binder)  OT Visit Diagnosis: Unsteadiness on feet (R26.81);Muscle weakness (generalized) (M62.81);Pain;Other abnormalities of gait and mobility (R26.89)   Activity Tolerance Patient tolerated treatment well   Patient Left in chair;with call bell/phone within reach;with chair alarm set   Nurse Communication Mobility status        Time: 1300-1330 OT Time Calculation (min): 30 min  Charges: OT General Charges $OT Visit: 1 Visit  Latika Kronick OT/L Acute Rehabilitation Department  [phone]  03/28/2024, 3:20 PM

## 2024-03-28 NOTE — Plan of Care (Signed)

## 2024-03-28 NOTE — Progress Notes (Signed)
 Physical Therapy Treatment Patient Details Name: Alexandria Durham MRN: [id number] DOB: Jun 18, 1946 Today's Date: 03/28/2024   History of Present Illness Patient is a 78 year old female who presented on 03/01/2024 following a D and C on 7/1 for thickened endometrium and procedure complicated due to bowel perforation. Pt developed signs and symptoms of sepsis and shock. 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  Pt required post op ventilation and extubated on 7/4. Pt had removal of surgical drain and s/p left paracolic abscess drain placement on 03/23/24 by IR. Pt PMH includes but is not limited to:  recent hospital admissions for diverticulitis, pancreatitis, abdominal pain, D&C  01/06/24 with uterine perforation, cholecystectomy and d&C on 01/17/24, DM II, HTN, COPD, GERD and CKD III.    PT Comments  The patient continues to  mobilize well today, able to stand  and step  to bed from recliner using RW. Patient will benefit from continued inpatient follow up therapy, <3 hours/day    If plan is discharge home, recommend the following: A little help with walking and/or transfers;A little help with bathing/dressing/bathroom;Assistance with cooking/housework;Assist for transportation;Help with stairs or ramp for entrance   Can travel by private vehicle        Equipment Recommendations  Rolling walker (2 wheels)    Recommendations for Other Services       Precautions / Restrictions Precautions Precautions: Fall Recall of Precautions/Restrictions: Intact Precaution/Restrictions Comments: Abdominal sx - wound vac, JP drain, ostomy Required Braces or Orthoses: Other Brace Other Brace: abd binder when out of bed (it was in  her bedside drwer Restrictions Weight Bearing Restrictions Per Provider Order: No Other Position/Activity Restrictions: wound vac, JP drain, ostomy     Mobility  Bed Mobility   Bed Mobility: Sit to Supine Rolling: Contact guard assist    Sit to supine:  Min assist   General bed mobility comments: assist legs onto bed    Transfers Overall transfer level: Needs assistance Equipment used: Rolling walker (2 wheels) Transfers: Sit to/from Stand, Bed to chair/wheelchair/BSC Sit to Stand: Min assist           General transfer comment: cues to push up from armrests,, steady assist at Rw. Patient able to step to pivot from recliner to  bed with min assistance    Ambulation/Gait Stairs             Wheelchair Mobility     Tilt Bed    Modified Rankin (Stroke Patients Only)       Balance Overall balance assessment: Needs assistance Sitting-balance support: No upper extremity supported, Feet supported Sitting balance-Leahy Scale: Good     Standing balance support: Bilateral upper extremity supported, During functional activity, Reliant on assistive device for balance Standing balance-Leahy Scale: Poor                              Communication Communication Communication: No apparent difficulties Factors Affecting Communication: Reduced clarity of speech  Cognition Arousal: Alert Behavior During Therapy: WFL for tasks assessed/performed   PT - Cognitive impairments: No apparent impairments                       PT - Cognition Comments: pt alert, talkative, cheerful, readily   mobilized Following commands: Intact Following commands impaired: Only follows one step commands consistently    Cueing Cueing Techniques: Verbal cues  Exercises      General Comments  General comments (skin integrity, edema, etc.): RA, no SOB, wound vac and drain intact and abdominal binder tolerated      Pertinent Vitals/Pain Pain Assessment Pain Assessment: No/denies pain Faces Pain Scale: Hurts little more Pain Location: abdomen, one episode of a cramp after ambulating Pain Descriptors / Indicators: Cramping Pain Intervention(s): Monitored during session, Premedicated before session    Home Living                           Prior Function            PT Goals (current goals can now be found in the care plan section) Progress towards PT goals: Progressing toward goals    Frequency    Min 2X/week      PT Plan      Co-evaluation PT/OT/SLP Co-Evaluation/Treatment: Yes Reason for Co-Treatment: Complexity of the patient's impairments (multi-system involvement);For patient/therapist safety;To address functional/ADL transfers PT goals addressed during session: Mobility/safety with mobility;Balance;Proper use of DME OT goals addressed during session: ADL's and self-care;Proper use of Adaptive equipment and DME      AM-PAC PT 6 Clicks Mobility   Outcome Measure  Help needed turning from your back to your side while in a flat bed without using bedrails?: None Help needed moving from lying on your back to sitting on the side of a flat bed without using bedrails?: A Little Help needed moving to and from a bed to a chair (including a wheelchair)?: A Little Help needed standing up from a chair using your arms (e.g., wheelchair or bedside chair)?: A Little Help needed to walk in hospital room?: A Little Help needed climbing 3-5 steps with a railing? : Total 6 Click Score: 17    End of Session Equipment Utilized During Treatment: Gait belt Activity Tolerance: Patient tolerated treatment well Patient left: in bed;with bed alarm set;with call bell/phone within reach Nurse Communication: Mobility status PT Visit Diagnosis: Difficulty in walking, not elsewhere classified (R26.2);Muscle weakness (generalized) (M62.81);Pain     Time: 1430-1445 PT Time Calculation (min) (ACUTE ONLY): 15 min  Charges:    $Therapeutic Activity: 8-22 mins PT General Charges $$ ACUTE PT VISIT: 1 Visit                     Darice Potters PT Acute Rehabilitation Services Office [phone]  Potters Darice Norris 03/28/2024, 3:51 PM

## 2024-03-28 NOTE — Plan of Care (Signed)
   Problem: Clinical Measurements: Goal: Ability to maintain clinical measurements within normal limits will improve Outcome: Progressing Goal: Will remain free from infection Outcome: Progressing   Problem: Coping: Goal: Level of anxiety will decrease Outcome: Progressing

## 2024-03-29 LAB — GLUCOSE, CAPILLARY
Glucose-Capillary: 107 mg/dL — ABNORMAL HIGH (ref 70–99)
Glucose-Capillary: 131 mg/dL — ABNORMAL HIGH (ref 70–99)

## 2024-03-29 NOTE — Progress Notes (Signed)
 Referring Provider(s): Viktoria. K   Supervising Physician: Karalee Beat  Patient Status:  [facility] - In-pt  Chief Complaint:  Left paracolic abscess status post left paracolic abscess drain placement 03/23/2024 Dr. Karalee   Subjective:  Pt states she is feeling well, no pain at drain site.  All pt questions answered.    Allergies: Ace inhibitors, Tape, Aspirin, and Codeine  Medications: Prior to Admission medications   Medication Sig Start Date End Date Taking? Authorizing Provider  bisacodyl  5 MG EC tablet Take 5 mg by mouth daily as needed for moderate constipation.   Yes [provider]  buPROPion  (WELLBUTRIN  XL) 150 MG 24 hr tablet Take 150 mg by mouth every evening.   Yes [provider]  Cholecalciferol  (VITAMIN D3) 25 MCG (1000 UT) CAPS Take 1,000 Units by mouth daily.   Yes [provider]  Cyanocobalamin  (VITAMIN B-12 PO) Take 1 tablet by mouth in the morning.   Yes [provider]  diphenhydramine -acetaminophen  (TYLENOL  PM) 25-500 MG TABS tablet Take 2 tablets by mouth at bedtime as needed (Sleep/Pain).   Yes [provider]  ezetimibe  (ZETIA ) 10 MG tablet Take 10 mg by mouth daily. 10/29/23 10/28/24 Yes [provider]  ferrous sulfate 325 (65 FE) MG tablet Take 325 mg by mouth daily with breakfast.   Yes [provider]  fluticasone  (FLONASE ) 50 MCG/ACT nasal spray Place 2 sprays into both nostrils daily as needed for rhinitis. 02/15/24  Yes Cheryle Page, MD  hydrochlorothiazide  (HYDRODIURIL ) 25 MG tablet Take 1 tablet (25 mg total) by mouth daily. 01/10/24  Yes Barbarann Nest, MD  levothyroxine  (SYNTHROID ) 75 MCG tablet Take 75 mcg by mouth daily before breakfast. 09/22/22  Yes [provider]  losartan  (COZAAR ) 50 MG tablet Take 50 mg by mouth in the morning.   Yes [provider]  magnesium  oxide (MAG-OX) 400 MG tablet Take 400 mg by mouth daily. 03/01/17  Yes [provider]   methocarbamol  (ROBAXIN ) 500 MG tablet Take 1 tablet (500 mg total) by mouth 2 (two) times daily as needed for muscle spasms. 05/25/21  Yes Joldersma, Logan, PA-C  mometasone (ELOCON) 0.1 % cream Apply 1 Application topically daily as needed (Dermatitis- affected sites). 10/27/23  Yes [provider]  omeprazole (PRILOSEC) 40 MG capsule Take 40 mg by mouth daily before breakfast.   Yes [provider]  polycarbophil (FIBERCON) 625 MG tablet Take 1 tablet (625 mg total) by mouth 2 (two) times daily. 01/09/24  Yes Barbarann Nest, MD  polyethylene glycol powder (GLYCOLAX /MIRALAX ) 17 GM/SCOOP powder Take 17 g by mouth 2 (two) times daily as needed for moderate constipation. 08/15/15  Yes [provider]  sitaGLIPtin (JANUVIA) 50 MG tablet Take 50 mg by mouth daily. 10/28/22  Yes [provider]  triamcinolone ointment (KENALOG) 0.5 % Apply 1 Application topically 2 (two) times daily as needed (for itching).   Yes [provider]  TYLENOL  8 HOUR ARTHRITIS PAIN 650 MG CR tablet Take 650-1,300 mg by mouth every 8 (eight) hours as needed for pain.   Yes [provider]  ondansetron  (ZOFRAN ) 4 MG tablet Take 1 tablet (4 mg total) by mouth every 6 (six) hours as needed for nausea. 02/15/24   Cheryle Page, MD     Vital Signs: BP 103/67 (BP Location: Left Arm)   Pulse 94   Temp 98.5 F (36.9 C) (Oral)   Resp 16   Ht 5' 4 (1.626 m)   Wt 163 lb 12.8  oz (74.3 kg)   SpO2 95%   BMI 28.12 kg/m   Physical Exam Constitutional:      Appearance: Normal appearance.  Skin:    Comments: LLQ drain F/A easily, suture in place  Skin clean, dry, without signs of infection   Output 25 mL, pink/white in JP   Neurological:     Mental Status: She is alert and oriented to person, place, and time.  Psychiatric:        Behavior: Behavior normal.      Labs:  CBC: Recent Labs    03/20/24 0858 03/22/24 0812 03/24/24 0540 03/26/24 1313  WBC 8.9 10.1 10.4  7.4  HGB 8.1* 8.7* 8.9* 8.7*  HCT 25.4* 27.5* 27.1* 27.1*  PLT 535* 584* 605* 601*    COAGS: No results for input(s): INR, APTT in the last 8760 hours.  BMP: Recent Labs    03/22/24 0812 03/24/24 0540 03/25/24 1221 03/26/24 1313  NA 131* 130* 132* 132*  K 4.5 4.2 4.2 4.3  CL 99 96* 98 97*  CO2 25 25 24 25   GLUCOSE 157* 96 174* 113*  BUN 18 16 17 15   CALCIUM  9.1 9.1 8.9 8.9  CREATININE 0.92 0.92 0.97 1.09*  GFRNONAA >60 >60 >60 53*    LIVER FUNCTION TESTS: Recent Labs    03/16/24 0438 03/17/24 0711 03/20/24 0525 03/26/24 1313  BILITOT 0.7 0.5 0.6 0.3  AST 48* 59* 44* 31  ALT 86* 85* 68* 41  ALKPHOS 126 129* 100 88  PROT 6.3* 6.4* 6.0* 7.0  ALBUMIN  1.8* 1.8* 1.7* 2.1*    Assessment and Plan:  Left paracolic abscess status post left paracolic abscess drain placement 03/23/2024 Dr. Karalee Ribas Location: LLQ Size: Fr size: 10 Fr Date of placement: 03/23/24  Currently to: Drain collection device: suction bulb 24 hour output:  Output by Drain (mL) 03/27/24 0701 - 03/27/24 1900 03/27/24 1901 - 03/28/24 0700 03/28/24 0701 - 03/28/24 1900 03/28/24 1901 - 03/29/24 0700 03/29/24 0701 - 03/29/24 1154  Closed System Drain 1 LLQ 10 Fr. 55 20 35 50 20  Negative Pressure Wound Therapy Abdomen Medial;Mid 250 25       Interval imaging/drain manipulation:  None   Current examination: Flushes/aspirates easily.  Insertion site unremarkable. Suture and stat lock in place. Dressed appropriately.   Plan: Continue TID flushes with 5 cc NS. Record output Q shift. Dressing changes QD or PRN if soiled.  Call IR APP or on call IR MD if difficulty flushing or sudden change in drain output.  Repeat imaging/possible drain injection once output < 10 mL/QD (excluding flush material). Consideration for drain removal if output is < 10 mL/QD (excluding flush material), pending discussion with the providing surgical service.  Discharge planning: Please contact IR APP or on  call IR MD prior to patient d/c to ensure appropriate follow up plans are in place. Typically patient will follow up with IR clinic 10-14 days post d/c for repeat imaging/possible drain injection. IR scheduler will contact patient with date/time of appointment. Patient will need to flush drain QD with 5 cc NS, record output QD, dressing changes every 2-3 days or earlier if soiled.   IR will continue to follow - please call with questions or concerns.  Electronically Signed: Lavanda JAYSON Jurist, PA-C 03/29/2024, 11:54 AM    I spent a total of 15 Minutes at the the patient's bedside AND on the patient's hospital floor or unit, greater than 50% of which was counseling/coordinating care for LLQ  drain.

## 2024-03-29 NOTE — [place] FL2 (Signed)
   MEDICAID FL2 LEVEL OF CARE FORM     IDENTIFICATION  Patient Name: Alexandria Durham Birthdate: 07-Oct-1945 Sex: female Admission Date (Current Location): 02/29/2024  [facility] and [place] Number:  Producer, television/film/video and Address:  [facility],  [address] [place], [place] [address]      Provider Number: [id number]  Attending Physician Name and Address:  [name] Mays, MD  Relative Name and Phone Number:  [name] [phone]    Current Level of Care: Hospital Recommended Level of Care: Skilled Nursing Facility Prior Approval Number:    Date Approved/Denied:   PASRR Number: [id number]  Discharge Plan: SNF    Current Diagnoses: Patient Active Problem List   Diagnosis Date Noted   Protein-calorie malnutrition, severe 03/02/2024   Postmenopausal bleeding 02/29/2024   Acute diverticulitis 02/08/2024   AKI (acute kidney injury) (HCC) 02/08/2024   Dehydration 02/08/2024   Endometritis 01/19/2024   Acute pancreatitis 01/11/2024   Abdominal pain 01/06/2024   Thickened endometrium 01/06/2024   Uterine perforation 01/06/2024   Diverticulitis 01/02/2024   Acute idiopathic gout of left foot 12/23/2023   Avascular necrosis of bone of hip, left (HCC) 12/23/2023   Carpal tunnel syndrome of left wrist 09/20/2019   Bilateral carotid artery stenosis 05/10/2019   PVD (peripheral vascular disease) (HCC) 05/10/2019   Lumbar radiculopathy 07/06/2018   Lumbar spondylosis 07/06/2018   Stage 3a chronic kidney disease (HCC) 06/06/2018   Mixed hyperlipidemia 02/24/2018   Cervical radiculopathy 09/29/2017   Former smoker 05/06/2017   Postoperative hypothyroidism 04/14/2017   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   Cortical age-related cataract of both eyes 11/25/2016   Asymmetric septal hypertrophy 04/30/2016   Gastroesophageal reflux disease without esophagitis 03/02/2016   Cardiac murmur, unspecified 02/24/2016   Benign hypertension with CKD (chronic kidney  disease) stage III (HCC) 01/01/2016   Vitamin D  deficiency 12/31/2015   Essential hypertension 11/21/2015   Eczema 08/28/2015   Type 2 diabetes mellitus (HCC) 08/28/2015   Type 2 diabetes mellitus with diabetic polyneuropathy, without long-term current use of insulin  (HCC) 08/28/2015    Orientation RESPIRATION BLADDER Height & Weight     Self, Time, Situation, Place  Normal Incontinent Weight: 74.3 kg Height:  5' 4 (162.6 cm)  BEHAVIORAL SYMPTOMS/MOOD NEUROLOGICAL BOWEL NUTRITION STATUS     (n/a) Colostomy Diet (Carb modified)  AMBULATORY STATUS COMMUNICATION OF NEEDS Skin   Limited Assist Verbally Surgical wounds, Wound Vac (LLQ JP drain, wound vac midline,  wound vac to be d/c replace with wet to dry dressing BID)                       Personal Care Assistance Level of Assistance  Bathing, Feeding, Dressing Bathing Assistance: Limited assistance Feeding assistance: Limited assistance (set up) Dressing Assistance: Limited assistance Total Care Assistance:  (n/a)   Functional Limitations Info  Sight, Hearing, Speech Sight Info: Impaired (eyeglasses) Hearing Info: Adequate Speech Info: Adequate    SPECIAL CARE FACTORS FREQUENCY  PT (By licensed PT), OT (By licensed OT)     PT Frequency: 5x/wk OT Frequency: 5x/wk            Contractures Contractures Info: Not present    Additional Factors Info  Code Status, Allergies, Psychotropic, Insulin  Sliding Scale, Isolation Precautions, Suctioning Needs Code Status Info: Full Allergies Info: Ace Inhibitors, Tape, Aspirin, Codeine Psychotropic Info: n/a see discharge summary Insulin  Sliding Scale Info: n/a see discharge summary Isolation Precautions Info: n/a Suctioning Needs: n/a   Current Medications (03/29/2024):  This is the current hospital active medication list Current Facility-Administered Medications  Medication Dose Route Frequency Provider Last Rate Last Admin   buPROPion  (WELLBUTRIN  XL) 24 hr tablet 150 mg   150 mg Oral QPM Rogelio Planas, MD   150 mg at 03/28/24 1729   Chlorhexidine  Gluconate Cloth 2 % PADS 6 each  6 each Topical Daily [name] Mays, MD   6 each at 03/29/24 0956   enoxaparin  (LOVENOX ) injection 40 mg  40 mg Subcutaneous Q24H Allred, Darrell K, PA-C   40 mg at 03/29/24 1320   ezetimibe  (ZETIA ) tablet 10 mg  10 mg Oral Daily Pahwani, Ravi, MD   10 mg at 03/29/24 0956   feeding supplement (KATE FARMS STANDARD 1.4) liquid 325 mL  325 mL Oral TID BM [name] Mays, MD   325 mL at 03/29/24 1320   gabapentin  (NEURONTIN ) capsule 300 mg  300 mg Oral TID Cross, Melissa D, NP   300 mg at 03/29/24 0956   hydrALAZINE  (APRESOLINE ) injection 10 mg  10 mg Intravenous Q4H PRN Autry, Lauren E, PA-C   10 mg at 03/07/24 2307   HYDROmorphone  (DILAUDID ) injection 1 mg  1 mg Intravenous Q2H PRN Cross, Melissa D, NP   1 mg at 03/29/24 1320   labetalol  (NORMODYNE ) injection 10 mg  10 mg Intravenous Q2H PRN Autry, Lauren E, PA-C   10 mg at 03/08/24 0013   levothyroxine  (SYNTHROID ) tablet 75 mcg  75 mcg Oral QAC breakfast Vernon Ranks, MD   75 mcg at 03/29/24 0521   lidocaine  (LIDODERM ) 5 % 1 patch  1 patch Transdermal Q24H [name] Mays, MD   1 patch at 03/28/24 2252   linagliptin  (TRADJENTA ) tablet 5 mg  5 mg Oral Daily Mathews, Elizabeth G, MD   5 mg at 03/29/24 9043   melatonin tablet 3 mg  3 mg Oral QHS Autry, Lauren E, PA-C   3 mg at 03/28/24 2207   meropenem  (MERREM ) 1 g in sodium chloride  0.9 % 100 mL IVPB  1 g Intravenous BID [name] Mays, MD 200 mL/hr at 03/29/24 0523 1 g at 03/29/24 9476   methocarbamol  (ROBAXIN ) tablet 500 mg  500 mg Oral Q8H PRN Cross, Melissa D, NP   500 mg at 03/18/24 1448   mirtazapine  (REMERON ) tablet 7.5 mg  7.5 mg Oral QHS [name] Mays, MD   7.5 mg at 03/28/24 2207   multivitamin with minerals tablet 1 tablet  1 tablet Oral Daily Ellington, Abby K, RPH   1 tablet at 03/29/24 0956   ondansetron  (ZOFRAN ) tablet 4 mg  4 mg Oral Q6H PRN Pahwani, Ravi, MD   4  mg at 03/11/24 1255   Or   ondansetron  (ZOFRAN ) injection 4 mg  4 mg Intravenous Q6H PRN Pahwani, Ravi, MD   4 mg at 03/25/24 2133   Oral care mouth rinse  15 mL Mouth Rinse PRN [name] Mays, MD   15 mL at 03/05/24 0925   oxyCODONE  (Oxy IR/ROXICODONE ) immediate release tablet 10-15 mg  10-15 mg Oral Q4H PRN Cross, Melissa D, NP   10 mg at 03/29/24 0957   pantoprazole  (PROTONIX ) EC tablet 40 mg  40 mg Oral QHS Robertson, Crystal S, RPH   40 mg at 03/28/24 2207   polyethylene glycol (MIRALAX  / GLYCOLAX ) packet 17 g  17 g Oral Daily Cross, Melissa D, NP   17 g at 03/29/24 0956   simethicone  (MYLICON) 40 MG/0.6ML suspension 80 mg  80 mg Oral QID PRN Pahwani, Ravi, MD  sodium chloride  flush (NS) 0.9 % injection 10-40 mL  10-40 mL Intracatheter Q12H [name] Mays, MD   10 mL at 03/29/24 0956   sodium chloride  flush (NS) 0.9 % injection 10-40 mL  10-40 mL Intracatheter PRN [name] Mays, MD       sodium chloride  flush (NS) 0.9 % injection 10-40 mL  10-40 mL Intracatheter Q12H Cross, Melissa D, NP   10 mL at 03/29/24 0956   sodium chloride  flush (NS) 0.9 % injection 10-40 mL  10-40 mL Intracatheter PRN Cross, Melissa D, NP   10 mL at 03/16/24 2129   sodium chloride  flush (NS) 0.9 % injection 5 mL  5 mL Intracatheter Q8H Karalee Wilkie POUR, MD   5 mL at 03/29/24 1320     Discharge Medications: Please see discharge summary for a list of discharge medications.  Relevant Imaging Results:  Relevant Lab Results:   Additional Information SS# [id number]  [name], RN

## 2024-03-29 NOTE — TOC Progression Note (Addendum)
 Transition of Care [facility]) - Progression Note    Patient Details  Name: Alexandria Durham MRN: [id number] Date of Birth: 17-Apr-1946  [facility] CM/SW Contact  [name], RN Phone Number:[phone]  03/29/2024, 10:31 AM  Clinical Narrative:    CM spoke with daughter Alexandria Durham [phone]) CM explained conversation from yesterday with patient. Daughter states that patient is not able to just come home right now. Daughter explained that in her conversations with physician she has been told that her mother would need 24/7 care and daughter works part time and is not able to provide 24/7 care. Daughter states that she will be coming to Hospital around noon today and will talk to patient about disposition planning. Daughter to call CM after conversation with patient and CM to return to bedside for disposition discussion.   1200 Daughter at bedside. CM at bedside to discuss disposition planning. Patient is now agreeable stating that she does need some rehab before discharging home. CM to initiate SNF search. Medicare.gov list has been provided for patient and daughter.   1554 FL2 completed and faxed out for bed offers.    Expected Discharge Plan: Home/Self Care Barriers to Discharge: Continued Medical Work up               Expected Discharge Plan and Services In-house Referral: NA Discharge Planning Services: CM Consult Post Acute Care Choice: NA Living arrangements for the past 2 months: Single Family Home                 DME Arranged: N/A DME Agency: NA       HH Arranged: NA HH Agency: NA         Social Drivers of Health (SDOH) Interventions SDOH Screenings   Food Insecurity: No Food Insecurity (02/29/2024)  Housing: High Risk (02/29/2024)  Transportation Needs: No Transportation Needs (02/29/2024)  Utilities: Not At Risk (02/29/2024)  Recent Concern: Utilities - At Risk (02/09/2024)  Social Connections: Patient Declined (02/29/2024)  Tobacco Use: Medium  Risk (03/01/2024)    Readmission Risk Interventions    03/02/2024    3:05 PM  Readmission Risk Prevention Plan  Transportation Screening Complete  PCP or Specialist Appt within 3-5 Days Complete  HRI or Home Care Consult Complete  Social Work Consult for Recovery Care Planning/Counseling Complete  Palliative Care Screening Not Applicable  Medication Review Oceanographer) Complete

## 2024-03-29 NOTE — Progress Notes (Cosign Needed Addendum)
 GYN Oncology Progress Note  28 Days Post-Op Procedure(s) (LRB): LAPAROTOMY, EXPLORATORY (N/A) SIGMOIDOSCOPY COLECTOMY, WITH COLOSTOMY CREATION MOBILIZATION, SPLENIC FLEXURE, WITH PARTIAL COLECTOMY CYSTOSCOPY  Subjective: Patient reports doing well this am. She has eaten her oatmeal and drank milk. States her french toast and bacon are cold. No nausea or emesis. Pain is managed and she uses medication when needed.  Plans on trying to get to chair today. Denies chest pain, dyspnea.   Objective: Vital signs in last 24 hours: Temp:  [98.1 F (36.7 C)-98.5 F (36.9 C)] 98.5 F (36.9 C) (07/30 0450) Pulse Rate:  [89-99] 94 (07/30 0450) Resp:  [16] 16 (07/30 0450) BP: (103-159)/(67-77) 103/67 (07/30 0450) SpO2:  [95 %-98 %] 95 % (07/30 0450) Weight:  [163 lb 12.8 oz (74.3 kg)] 163 lb 12.8 oz (74.3 kg) (07/30 0450) Last BM Date : 03/28/24  Intake/Output from previous day: 07/29 0701 - 07/30 0700 In: 302 [P.O.:297] Out: 1985 [Urine:1200; Drains:85; Stool:700] 325 cc of yellow urine currently in the canister  Physical Examination: General: Alert, oriented, in no acute distress, in bed, comfortable Respiratory: Normal work of breathing. Lungs clear to auscultation bilaterally without rhonchi. Cardiovascular: Regular sinus rhythm and rate, no murmurs or rubs. Abdomen: soft, + BS, less tender to palpation diffusely (stable). Incision: wound VAC in place to suction- cloudy tan-red drainage in the canister. Left paracolic gutter drain with purulent red-tan drainage. Dry dressing around drain. Ostomy viable, bag with soft stool, no flatus.    Extremities: extremities warm and well perfused, no edema Gu: Purewick in place with yellow urine  Labs: Drain culture from 7/24 - few e coli, rare pseudomonas, rare bacteroides thetaiotaomicron and bacteroides caccae  Assessment: 78 y.o. s/p Procedure(s): LAPAROTOMY, EXPLORATORY SIGMOIDOSCOPY COLECTOMY, WITH COLOSTOMY CREATION MOBILIZATION,  SPLENIC FLEXURE, WITH PARTIAL COLECTOMY CYSTOSCOPY: working on increasing PO intake, physical conditioning   Post-op: -SNF recommended by Dr. [name] and PT. Case manager to continue efforts for SNF placement. -plan to stop negative pressure wound therapy on Thurs, March 30, 2024 with transition to twice daily wet to dry dressing changes. Per WO RN, wet to dry would be appropriate at this time.  -Appreciate ID input. Starting discussions about plan for long term antibiotics at discharge, SNF plan. -continue to work on being up to chair, out of bed with assistance. PT/OT -Intake improving over yesterday and today. Has had poor PO intake since TPN discontinued on 7/21.  - continue Remeron  - having bowel function  - purewick in place with adequate output -At discharge to SNF, patient will need twice daily wet to dry dressing changes to the midline abdominal incision and will need to continue IV antibiotics (until Apr 20, 2024). Will also need ostomy care/assistance and continued physical therapy.   Intra-abdominal infection: 7/10 culture with e coli and enterococcus 7/14 incision culture with e coli and pseudomonas  7/24 culture from IR drain: ecoli and pseudomonas, both sensitive to imipenem.  Current antibiotic regimen: meropenem  (7/17). Linezolid  (started 7/14) discontinued today per ID rec. - CT on 7/23 with 8 cm left para-colic gutter fluid collection concerning for abscess. IR drain placement on 7/24 (removed JP from surgery at the time of this drain placement) - ID following. Appreciate recommendations. Per Dr. [name], recommendation to continue with meropenem  at discharge - leukocytosis resolved, last CBC 7/27   Severe calorie-deficient nutrition: Continue to provide encouragement. Improved intake yesterday and today. - continue TID supplements - calorie counts to discontinue after discussion with Dr. [name] and RD. - consistency  of intake continues to be a challenge - continue with  intake and output   Hyponatremia: - asymptomatic, stable   Acute on chronic anemia: - asymptomatic, continue to monitor   Thickened endometrium: - failed endometrial sampling on multiple attempts (hysteroscopic and under intra-abdominal visualization) - no definitive malignancy or hyperplasia diagnosis   Type 2 diabetes mellitus:  - CBGs 145-90 in last 24 hours - Discussion with CHRISTELLA Rav, pharmacist-plan to discontinue CBGs and sliding scale since CBGs have been normal (stable) and she has not required sliding scale coverage on a regular basis. On home oral med.   Prophylaxis:  - SCDs, lovenox    LOS: 29 days    Alexandria Durham 03/29/2024, 10:33 AM

## 2024-03-29 NOTE — Progress Notes (Signed)
 PHARMACY CONSULT NOTE FOR:  OUTPATIENT  PARENTERAL ANTIBIOTIC THERAPY (OPAT)  Noted disposition conversations underway - including home with home health vs SNF  Indication: Polymicrobial intra-abdomin abscess Regimen: Meropenem  1g IV every 12 hours End date: 04/20/24  IV antibiotic discharge orders are pended. To discharging provider:  please sign these orders via discharge navigator,  Select New Orders & click on the button choice - Manage This Unsigned Work.     Thank you for allowing pharmacy to be a part of this patient's care.  Almarie Lunger, PharmD, BCPS, BCIDP Infectious Diseases Clinical Pharmacist 03/29/2024 1:40 PM   **Pharmacist phone directory can now be found on [email or web address] ([email or web address]).  Listed under [facility].

## 2024-03-29 NOTE — Progress Notes (Signed)
 Occupational Therapy Treatment Patient Details Name: Alexandria Durham MRN: [id number] DOB: 1946/01/21 Today's Date: 03/29/2024   History of present illness Patient is a 78 year old female who presented on 03/01/2024 following a D&C on 7/1 for thickened endometrium and procedure complicated due to bowel perforation. Pt developed signs and symptoms of sepsis and shock. 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  Pt required post op ventilation and extubated on 7/4. Pt had removal of surgical drain and s/p left paracolic abscess drain placement on 03/23/24 by IR. Pt PMH includes but is not limited to:  recent hospital admissions for diverticulitis, pancreatitis, abdominal pain, D&C  01/06/24 with uterine perforation, cholecystectomy and d&C on 01/17/24, DM II, HTN, COPD, GERD and CKD III.   OT comments  The pt was motivated to participate in the session. She denied having any pain. She required max assist for sock management, min assist to stand using a RW, CGA to min assist for ambulation into the hall using a RW, and set-up assist for grooming in sitting at chair level. Overall her activity tolerance and functional abilities were much improved today, as compared to prior sessions when this OT worked with the pt. She is making functional progress. Continue OT plan of care. Patient will benefit from continued inpatient follow up therapy, <3 hours/day.       If plan is discharge home, recommend the following:  Assistance with cooking/housework;Assist for transportation;Help with stairs or ramp for entrance;A lot of help with bathing/dressing/bathroom;A little help with walking and/or transfers   Equipment Recommendations  Other (comment) (defer to next level of care)    Recommendations for Other Services      Precautions / Restrictions Precautions Precautions: Fall Other Brace:  (abdominal binder when out of bed) Restrictions Weight Bearing Restrictions Per Provider Order: No Other  Position/Activity Restrictions: wound vac, JP drain, ostomy       Mobility Bed Mobility Overal bed mobility: Needs Assistance Bed Mobility: Supine to Sit     Supine to sit: Min assist, HOB elevated, Used rails          Transfers Overall transfer level: Needs assistance Equipment used: Rolling walker (2 wheels) Transfers: Sit to/from Stand Sit to Stand: Min assist, From elevated surface        Balance       Sitting balance - Comments:  (static sitting-good. dynamic sitting-fair+)       Standing balance comment: CGA to min assist with RW        ADL either performed or assessed with clinical judgement   ADL Overall ADL's : Needs assistance/impaired     Grooming: Set up;Supervision/safety;Sitting Grooming Details (indicate cue type and reason): She performed face washing and teeth brushing seated in the chair.         Upper Body Dressing : Minimal assistance;Sitting Upper Body Dressing Details (indicate cue type and reason): to donn hospital gown Lower Body Dressing: Maximal assistance;Sitting/lateral leans Lower Body Dressing Details (indicate cue type and reason): to donn socks seated EOB                     Vision Baseline Vision/History: 1 Wears glasses           Communication Communication Communication: No apparent difficulties   Cognition Arousal: Alert Behavior During Therapy: WFL for tasks assessed/performed       Awareness: Online awareness impaired     Executive functioning impairment (select all impairments): Problem solving  Following commands impaired: Only follows one step commands consistently      Cueing   Cueing Techniques: Verbal cues  Exercises              Pertinent Vitals/ Pain       Pain Assessment Pain Assessment: No/denies pain   Frequency  Min 2X/week        Progress Toward Goals  OT Goals(current goals can now be found in the care plan section)  Progress towards  OT goals: Progressing toward goals  Acute Rehab OT Goals Patient Stated Goal: to get better and to return home OT Goal Formulation: With patient Time For Goal Achievement: 04/07/24 Potential to Achieve Goals: Good  Plan         AM-PAC OT 6 Clicks Daily Activity     Outcome Measure   Help from another person eating meals?: None Help from another person taking care of personal grooming?: A Little Help from another person toileting, which includes using toliet, bedpan, or urinal?: A Lot Help from another person bathing (including washing, rinsing, drying)?: A Lot Help from another person to put on and taking off regular upper body clothing?: A Little Help from another person to put on and taking off regular lower body clothing?: A Lot 6 Click Score: 16    End of Session Equipment Utilized During Treatment: Rolling walker (2 wheels);Gait belt  OT Visit Diagnosis: Unsteadiness on feet (R26.81);Muscle weakness (generalized) (M62.81);Other abnormalities of gait and mobility (R26.89)   Activity Tolerance Patient tolerated treatment well   Patient Left in chair;with call bell/phone within reach;with chair alarm set;with family/visitor present   Nurse Communication Mobility status        Time: 1347-1410 OT Time Calculation (min): 23 min  Charges: OT General Charges $OT Visit: 1 Visit OT Treatments $Self Care/Home Management : 8-22 mins $Therapeutic Activity: 8-22 mins     Delanna LITTIE Molt, OTR/L 03/29/2024, 5:08 PM

## 2024-03-30 LAB — COMPREHENSIVE METABOLIC PANEL WITH GFR
ALT: 30 U/L (ref 0–44)
AST: 25 U/L (ref 15–41)
Albumin: 2.2 g/dL — ABNORMAL LOW (ref 3.5–5.0)
Alkaline Phosphatase: 67 U/L (ref 38–126)
Anion gap: 11 (ref 5–15)
BUN: 14 mg/dL (ref 8–23)
CO2: 24 mmol/L (ref 22–32)
Calcium: 9.4 mg/dL (ref 8.9–10.3)
Chloride: 101 mmol/L (ref 98–111)
Creatinine, Ser: 0.57 mg/dL (ref 0.44–1.00)
GFR, Estimated: >60 mL/min (ref >60)
Glucose, Bld: 109 mg/dL — ABNORMAL HIGH (ref 70–99)
Potassium: 4.2 mmol/L (ref 3.5–5.1)
Sodium: 136 mmol/L (ref 135–145)
Total Bilirubin: 0.4 mg/dL (ref 0.0–1.2)
Total Protein: 6.7 g/dL (ref 6.5–8.1)

## 2024-03-30 LAB — CBC
HCT: 24.9 % — ABNORMAL LOW (ref 36.0–46.0)
Hemoglobin: 7.9 g/dL — ABNORMAL LOW (ref 12.0–15.0)
MCH: 29.4 pg (ref 26.0–34.0)
MCHC: 31.7 g/dL (ref 30.0–36.0)
MCV: 92.6 fL (ref 80.0–100.0)
Platelets: 452 K/uL — ABNORMAL HIGH (ref 150–400)
RBC: 2.69 MIL/uL — ABNORMAL LOW (ref 3.87–5.11)
RDW: 16.6 % — ABNORMAL HIGH (ref 11.5–15.5)
WBC: 6.7 K/uL (ref 4.0–10.5)
nRBC: 0 % (ref 0.0–0.2)

## 2024-03-30 NOTE — Plan of Care (Signed)
  Problem: Clinical Measurements: Goal: Ability to maintain clinical measurements within normal limits will improve Outcome: Progressing Goal: Will remain free from infection Outcome: Progressing Goal: Diagnostic test results will improve Outcome: Progressing Goal: Respiratory complications will improve Outcome: Progressing Goal: Cardiovascular complication will be avoided Outcome: Progressing   Problem: Activity: Goal: Risk for activity intolerance will decrease Outcome: Progressing   Problem: Coping: Goal: Level of anxiety will decrease Outcome: Progressing   Problem: Elimination: Goal: Will not experience complications related to bowel motility Outcome: Progressing   Problem: Pain Managment: Goal: General experience of comfort will improve and/or be controlled Outcome: Progressing   Problem: Safety: Goal: Ability to remain free from injury will improve Outcome: Progressing   Problem: Skin Integrity: Goal: Risk for impaired skin integrity will decrease Outcome: Progressing   Problem: Education: Goal: Ability to describe self-care measures that may prevent or decrease complications (Diabetes Survival Skills Education) will improve Outcome: Progressing   Problem: Coping: Goal: Ability to adjust to condition or change in health will improve Outcome: Progressing   Problem: Fluid Volume: Goal: Ability to maintain a balanced intake and output will improve Outcome: Progressing   Problem: Health Behavior/Discharge Planning: Goal: Ability to identify and utilize available resources and services will improve Outcome: Progressing Goal: Ability to manage health-related needs will improve Outcome: Progressing   Problem: Metabolic: Goal: Ability to maintain appropriate glucose levels will improve Outcome: Progressing   Problem: Nutritional: Goal: Maintenance of adequate nutrition will improve Outcome: Progressing Goal: Progress toward achieving an optimal weight will  improve Outcome: Progressing   Problem: Skin Integrity: Goal: Risk for impaired skin integrity will decrease Outcome: Progressing   Problem: Tissue Perfusion: Goal: Adequacy of tissue perfusion will improve Outcome: Progressing   Problem: Education: Goal: Knowledge of the prescribed therapeutic regimen will improve Outcome: Progressing   Problem: Self-Concept: Goal: Communication of feelings regarding changes in body function or appearance will improve Outcome: Progressing   Problem: Skin Integrity: Goal: Demonstration of wound healing without infection will improve Outcome: Progressing   Problem: Activity: Goal: Ability to tolerate increased activity will improve Outcome: Progressing   Problem: Respiratory: Goal: Ability to maintain a clear airway and adequate ventilation will improve Outcome: Progressing   Problem: Role Relationship: Goal: Method of communication will improve Outcome: Progressing

## 2024-03-30 NOTE — Consult Note (Addendum)
 WOC Nurse wound follow up Wound type: surgical open wound, midline. Measurement: 8 cm x 4 cm x 5 cm tunneling in center.  Wound bed: 90% red, 10% yellow. Drainage (amount, consistency, odor) Minimum amount. Periwound: intact. Dressing procedure/placement/frequency: Cleanse the wound with saline, pat dry the edges. Apply soak Kerlix, filling the tunneling with a strip. Use a swab to help if is necessary. Note: Use only one piece of Kerlix to cover the wound bed and fill the tunneling.  Cover with dry gauze or ABD pad. Change daily or PRN.  WOC Nurse ostomy follow up Stoma type/location: LUQ colostomy Stomal assessment/size: oval shape 25 mm x 20 mm, surround by yellow slough stoma separation. 100% yellow, debrided part today with a surgical scissor. Peristomal assessment: mucous separation. Tunneling at 3 o'clock with 2 cm.  Treatment options for stomal/peristomal skin: Ring 2 Output 90 ml aprox. Yellow soft stools.  Ostomy pouching: 1pc.#[id number].  Education provided: Pt is not participating of the ostomy change. But she seems interesting in learn more about ostomy care. I offer a teaching section with some members of her family Monday morning, she will confirm with bed nurse if somebody will came.  Ordered supplies.  WOC team will follow MON for ostomy change. Please reconsult if further assistance is needed. Thank-you,  Lela Holm BSN, CNS, RN, [employer], WOCN  (Phone [phone])

## 2024-03-30 NOTE — Progress Notes (Signed)
 Physical Therapy Treatment Patient Details Name: Alexandria Durham MRN: [id number] DOB: 1946/05/11 Today's Date: 03/30/2024   History of Present Illness Patient is a 78 year old female who presented on 03/01/2024 following a D and C on 7/1 for thickened endometrium and procedure complicated due to bowel perforation. Pt developed signs and symptoms of sepsis and shock. 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  Pt required post op ventilation and extubated on 7/4. Pt had removal of surgical drain and s/p left paracolic abscess drain placement on 03/23/24 by IR. Wound vac removed 03/30/24. Pt PMH includes but is not limited to:  recent hospital admissions for diverticulitis, pancreatitis, abdominal pain, D&C  01/06/24 with uterine perforation, cholecystectomy and d&C on 01/17/24, DM II, HTN, COPD, GERD and CKD III.    PT Comments  Pt cheerful, motivated. Pt reports wound vac removed this morning, cued to continue log roll for comfort, CGA to complete with bedrail. Min A to power to stand with RW and amb 70 ft with RW in hallway, short slow steps, trunk slightly forward flexed, flat foot posture, increased time with turns. Pt needing intermittent steadying assistance, no overt LOB with ambulation. Pt fatigues easily, denies dizziness or SOB, reports sore in abdomen once sitting in recliner. Pt requests to remain up in recliner at EOS with all needs in reach.   If plan is discharge home, recommend the following: A little help with walking and/or transfers;A little help with bathing/dressing/bathroom;Assistance with cooking/housework;Assist for transportation;Help with stairs or ramp for entrance   Can travel by private vehicle     Yes  Equipment Recommendations  Rolling walker (2 wheels)    Recommendations for Other Services       Precautions / Restrictions Precautions Precautions: Fall Recall of Precautions/Restrictions: Intact Precaution/Restrictions Comments: Abdominal sx -  JP  drain, ostomy Restrictions Weight Bearing Restrictions Per Provider Order: No     Mobility  Bed Mobility Overal bed mobility: Needs Assistance Bed Mobility: Rolling, Sidelying to Sit Rolling: Contact guard assist, Used rails Sidelying to sit: Contact guard assist, Used rails       General bed mobility comments: cues for log rolling, CGA with bedrail use to roll onto side then right up into sitting, increased time and effort, cues for exhalation    Transfers Overall transfer level: Needs assistance Equipment used: Rolling walker (2 wheels) Transfers: Sit to/from Stand Sit to Stand: Min assist, From elevated surface           General transfer comment: cues for hand placement, min A to power up from bedside, slow to rise, BLE braced against front of bed    Ambulation/Gait Ambulation/Gait assistance: Min assist Gait Distance (Feet): 70 Feet Assistive device: Rolling walker (2 wheels) Gait Pattern/deviations: Decreased step length - right, Decreased step length - left, Decreased stride length Gait velocity: decreased     General Gait Details: slow, short steps, increased time and smaller steps with turns, cues to maintain body positioned within RW frame, no LE buckling or near falls, decreased cadence wtih decreased heel-toe pattern, trunk slightly forward flexed   Stairs             Wheelchair Mobility     Tilt Bed    Modified Rankin (Stroke Patients Only)       Balance Overall balance assessment: Needs assistance Sitting-balance support: Feet supported Sitting balance-Leahy Scale: Fair     Standing balance support: Bilateral upper extremity supported, During functional activity, Reliant on assistive device for balance  Standing balance-Leahy Scale: Poor                              Communication Communication Communication: No apparent difficulties  Cognition Arousal: Alert Behavior During Therapy: WFL for tasks assessed/performed    PT - Cognitive impairments: No apparent impairments                       PT - Cognition Comments: pt alert, cheerful and conversational, talking to other medical providers in hallway about functional improvements Following commands: Intact      Cueing Cueing Techniques: Verbal cues  Exercises General Exercises - Lower Extremity Long Arc Quad: AROM, Both, 5 reps, Seated Hip Flexion/Marching: AROM, Both, 5 reps, Seated Toe Raises: AROM, Both, 5 reps, Seated Heel Raises: AROM, Both, 5 reps, Seated    General Comments        Pertinent Vitals/Pain Pain Assessment Pain Assessment: Faces Faces Pain Scale: Hurts a little bit Pain Location: abdomen Pain Descriptors / Indicators: Sore Pain Intervention(s): Limited activity within patient's tolerance, Monitored during session, Premedicated before session, Repositioned    Home Living                          Prior Function            PT Goals (current goals can now be found in the care plan section) Acute Rehab PT Goals Patient Stated Goal: not stated PT Goal Formulation: With patient Time For Goal Achievement: 04/05/24 Potential to Achieve Goals: Fair Progress towards PT goals: Progressing toward goals    Frequency    Min 2X/week      PT Plan      Co-evaluation              AM-PAC PT 6 Clicks Mobility   Outcome Measure  Help needed turning from your back to your side while in a flat bed without using bedrails?: A Little Help needed moving from lying on your back to sitting on the side of a flat bed without using bedrails?: A Little Help needed moving to and from a bed to a chair (including a wheelchair)?: A Little Help needed standing up from a chair using your arms (e.g., wheelchair or bedside chair)?: A Little Help needed to walk in hospital room?: A Little Help needed climbing 3-5 steps with a railing? : Total 6 Click Score: 16    End of Session Equipment Utilized During  Treatment: Gait belt Activity Tolerance: Patient tolerated treatment well Patient left: in chair;with call bell/phone within reach;with chair alarm set Nurse Communication: Mobility status PT Visit Diagnosis: Difficulty in walking, not elsewhere classified (R26.2);Muscle weakness (generalized) (M62.81);Pain     Time: 1050-1109 PT Time Calculation (min) (ACUTE ONLY): 19 min  Charges:    $Gait Training: 8-22 mins PT General Charges $$ ACUTE PT VISIT: 1 Visit                     Tori Brin Ruggerio PT, DPT 03/30/24, 12:14 PM

## 2024-03-30 NOTE — Progress Notes (Signed)
 GYN Oncology Progress Note  29 Days Post-Op Procedure(s) (LRB): LAPAROTOMY, EXPLORATORY (N/A) SIGMOIDOSCOPY COLECTOMY, WITH COLOSTOMY CREATION MOBILIZATION, SPLENIC FLEXURE, WITH PARTIAL COLECTOMY CYSTOSCOPY  Subjective: Patient reports doing well this am. She ate some green beans last evening and had two supplemental shakes yesterday. She got out of bed yesterday and ambulated with assist. Abdominal pain is intermittent. No breathing issues.   Objective: Vital signs in last 24 hours: Temp:  [98 F (36.7 C)-98.8 F (37.1 C)] 98 F (36.7 C) (07/31 0514) Pulse Rate:  [88-102] 93 (07/31 0514) Resp:  [16] 16 (07/31 0514) BP: (127-152)/(73-89) 127/84 (07/31 0514) SpO2:  [95 %-98 %] 98 % (07/31 0514) Weight:  [173 lb 15.1 oz (78.9 kg)] 173 lb 15.1 oz (78.9 kg) (07/31 0500) Last BM Date : 03/29/24  Intake/Output from previous day: 07/30 0701 - 07/31 0700 In: 735 [P.O.:420; I.V.:10; IV Piggyback:300] Out: 1585 [Urine:1300; Drains:135; Stool:150] yellow urine currently in the canister  Physical Examination: General: sleeping, oriented when awake, in no acute distress, in bed, comfortable Respiratory: Normal work of breathing. Lungs clear to auscultation bilaterally without rhonchi. Cardiovascular: Regular sinus rhythm and rate, no murmurs or rubs. Abdomen: soft, + BS, less tender to palpation diffusely (stable). Incision: wound VAC in place to suction- minimal cloudy tan-red drainage in the canister. Left paracolic gutter drain with purulent tan drainage. Dry dressing around drain. Ostomy viable, bag with soft stool, no flatus.    Extremities: extremities warm and well perfused, no edema Gu: Purewick in place with yellow urine See media for updated photo of abdominal incision and stoma. Wound VAC removed during our visit and wet to dry dressing applied. There is a 5 cm tunnel to the left mid incision. Wound is red/pink with minimal exudate. Ostomy bag changed as well. Pt watched ostomy  nurse with application and asked questions. Stoma has retracted-see media. Stoma is pink, viable.  Labs: Drain culture from 7/24 - few e coli, rare pseudomonas, rare bacteroides thetaiotaomicron and bacteroides caccae  Assessment: 78 y.o. s/p Procedure(s): LAPAROTOMY, EXPLORATORY SIGMOIDOSCOPY COLECTOMY, WITH COLOSTOMY CREATION MOBILIZATION, SPLENIC FLEXURE, WITH PARTIAL COLECTOMY CYSTOSCOPY: Awaiting SNF offers, working on increasing PO intake, physical conditioning   Post-op: -Case manager/SW initiating SNF search/placement. -negative pressure wound therapy stopped today with transition to twice daily wet to dry dressing changes.  -Appreciate ID input.  -continue to work on being up to chair, out of bed with assistance. PT/OT -Intake improving slowly. Has had poor PO intake since TPN discontinued on 7/21.  - continue Remeron  - having bowel function  - purewick in place with adequate output -At discharge to SNF, patient will need twice daily vs daily wet to dry dressing changes to the midline abdominal incision and will need to continue IV antibiotics (until Apr 20, 2024). Will also need ostomy care/assistance and continued physical therapy.   Intra-abdominal infection: 7/10 culture with e coli and enterococcus 7/14 incision culture with e coli and pseudomonas  7/24 culture from IR drain: ecoli and pseudomonas, both sensitive to imipenem.  Current antibiotic regimen: meropenem  (7/17). Linezolid  (started 7/14) discontinued per ID rec. - CT on 7/23 with 8 cm left para-colic gutter fluid collection concerning for abscess. IR drain placement on 7/24 (removed JP from surgery at the time of this drain placement) - ID following. Appreciate recommendations. Per Dr. [name], recommendation to continue with meropenem  at discharge - leukocytosis resolved, WBC 6.7 this am.  Severe calorie-deficient nutrition: Continue to provide encouragement.  - continue TID supplements - calorie counts to  discontinue after discussion with Dr. [name] and RD. - consistency of intake continues to be a challenge - continue with intake and output Albumin  2.2 on am Cmet   Hyponatremia: - asymptomatic, stable   Acute on chronic anemia: - asymptomatic, continue to monitor   Thickened endometrium: - failed endometrial sampling on multiple attempts (hysteroscopic and under intra-abdominal visualization) - no definitive malignancy or hyperplasia diagnosis   Type 2 diabetes mellitus:  - Discussion with CHRISTELLA Rav, pharmacist-plan to discontinue CBGs and sliding scale since CBGs have been normal (stable) and she has not required sliding scale coverage on a regular basis. On home oral med.   Prophylaxis:  - SCDs, lovenox    LOS: 30 days    Alexandria Durham 03/30/2024, 9:52 AM

## 2024-03-30 NOTE — Plan of Care (Signed)
  Problem: Activity: Goal: Risk for activity intolerance will decrease Outcome: Progressing   Problem: Pain Managment: Goal: General experience of comfort will improve and/or be controlled Outcome: Progressing   Problem: Safety: Goal: Ability to remain free from injury will improve Outcome: Progressing   Problem: Skin Integrity: Goal: Risk for impaired skin integrity will decrease Outcome: Progressing

## 2024-03-31 NOTE — Progress Notes (Signed)
 Referring Provider(s): Viktoria POUR   Supervising Physician: Luverne Aran  Patient Status:  [facility] - In-pt  Chief Complaint:  Left paracolic abscess status post left paracolic abscess drain placement 03/23/2024 Dr. Karalee   Subjective:  Pt states she is feeling well today, continues with no pain at the drain site.  Pt states the drain is doing well and has already been emptied once this AM.  She states the drain was filling up a lot yesterday.  All pt questions answered.   Allergies: Ace inhibitors, Tape, Aspirin, and Codeine  Medications: Prior to Admission medications   Medication Sig Start Date End Date Taking? Authorizing Provider  bisacodyl  5 MG EC tablet Take 5 mg by mouth daily as needed for moderate constipation.   Yes [provider]  buPROPion  (WELLBUTRIN  XL) 150 MG 24 hr tablet Take 150 mg by mouth every evening.   Yes [provider]  Cholecalciferol  (VITAMIN D3) 25 MCG (1000 UT) CAPS Take 1,000 Units by mouth daily.   Yes [provider]  Cyanocobalamin  (VITAMIN B-12 PO) Take 1 tablet by mouth in the morning.   Yes [provider]  diphenhydramine -acetaminophen  (TYLENOL  PM) 25-500 MG TABS tablet Take 2 tablets by mouth at bedtime as needed (Sleep/Pain).   Yes [provider]  ezetimibe  (ZETIA ) 10 MG tablet Take 10 mg by mouth daily. 10/29/23 10/28/24 Yes [provider]  ferrous sulfate 325 (65 FE) MG tablet Take 325 mg by mouth daily with breakfast.   Yes [provider]  fluticasone  (FLONASE ) 50 MCG/ACT nasal spray Place 2 sprays into both nostrils daily as needed for rhinitis. 02/15/24  Yes Cheryle Page, MD  hydrochlorothiazide  (HYDRODIURIL ) 25 MG tablet Take 1 tablet (25 mg total) by mouth daily. 01/10/24  Yes Barbarann Nest, MD  levothyroxine  (SYNTHROID ) 75 MCG tablet Take 75 mcg by mouth daily before breakfast. 09/22/22  Yes [provider]  losartan  (COZAAR ) 50 MG tablet Take 50 mg by mouth  in the morning.   Yes [provider]  magnesium  oxide (MAG-OX) 400 MG tablet Take 400 mg by mouth daily. 03/01/17  Yes [provider]  methocarbamol  (ROBAXIN ) 500 MG tablet Take 1 tablet (500 mg total) by mouth 2 (two) times daily as needed for muscle spasms. 05/25/21  Yes Joldersma, Logan, PA-C  mometasone (ELOCON) 0.1 % cream Apply 1 Application topically daily as needed (Dermatitis- affected sites). 10/27/23  Yes [provider]  omeprazole (PRILOSEC) 40 MG capsule Take 40 mg by mouth daily before breakfast.   Yes [provider]  polycarbophil (FIBERCON) 625 MG tablet Take 1 tablet (625 mg total) by mouth 2 (two) times daily. 01/09/24  Yes Barbarann Nest, MD  polyethylene glycol powder (GLYCOLAX /MIRALAX ) 17 GM/SCOOP powder Take 17 g by mouth 2 (two) times daily as needed for moderate constipation. 08/15/15  Yes [provider]  sitaGLIPtin (JANUVIA) 50 MG tablet Take 50 mg by mouth daily. 10/28/22  Yes [provider]  triamcinolone ointment (KENALOG) 0.5 % Apply 1 Application topically 2 (two) times daily as needed (for itching).   Yes [provider]  TYLENOL  8 HOUR ARTHRITIS PAIN 650 MG CR tablet Take 650-1,300 mg by mouth every 8 (eight) hours as needed for pain.   Yes [provider]  ondansetron  (ZOFRAN ) 4 MG tablet Take 1 tablet (4 mg total) by mouth every 6 (six) hours as needed for nausea. 02/15/24   Cheryle Page, MD     Vital Signs: BP 101/80 (BP Location: Left  Arm)   Pulse 94   Temp 97.9 F (36.6 C) (Oral)   Resp 16   Ht 5' 4 (1.626 m)   Wt 154 lb 15.7 oz (70.3 kg)   SpO2 99%   BMI 26.60 kg/m   Physical Exam Constitutional:      Appearance: Normal appearance.  Skin:    Comments: Left lower quadrant drain F/A easily, suture in place Skin clean, dry, without signs of infection Output 10 mL; cream, thin output in JP  Neurological:     Mental Status: She is alert and oriented to person, place, and  time.  Psychiatric:        Behavior: Behavior normal.      Labs:  CBC: Recent Labs    03/22/24 0812 03/24/24 0540 03/26/24 1313 03/30/24 0438  WBC 10.1 10.4 7.4 6.7  HGB 8.7* 8.9* 8.7* 7.9*  HCT 27.5* 27.1* 27.1* 24.9*  PLT 584* 605* 601* 452*    COAGS: No results for input(s): INR, APTT in the last 8760 hours.  BMP: Recent Labs    03/24/24 0540 03/25/24 1221 03/26/24 1313 03/30/24 0438  NA 130* 132* 132* 136  K 4.2 4.2 4.3 4.2  CL 96* 98 97* 101  CO2 25 24 25 24   GLUCOSE 96 174* 113* 109*  BUN 16 17 15 14   CALCIUM  9.1 8.9 8.9 9.4  CREATININE 0.92 0.97 1.09* 0.57  GFRNONAA >60 >60 53* >60    LIVER FUNCTION TESTS: Recent Labs    03/17/24 0711 03/20/24 0525 03/26/24 1313 03/30/24 0438  BILITOT 0.5 0.6 0.3 0.4  AST 59* 44* 31 25  ALT 85* 68* 41 30  ALKPHOS 129* 100 88 67  PROT 6.4* 6.0* 7.0 6.7  ALBUMIN  1.8* 1.7* 2.1* 2.2*    Assessment and Plan:  Left paracolic abscess status post left paracolic abscess drain placement 03/23/2024 Dr. Karalee Ribas Location: LLQ Size: Fr size: 10 Fr Date of placement: 03/23/2024 Currently to: Drain collection device: suction bulb 24 hour output:  Output by Drain (mL) 03/29/24 0701 - 03/29/24 1900 03/29/24 1901 - 03/30/24 0700 03/30/24 0701 - 03/30/24 1900 03/30/24 1901 - 03/31/24 0700 03/31/24 0701 - 03/31/24 1004  Closed System Drain 1 LLQ 10 Fr. 20 25 20 30      Interval imaging/drain manipulation:  None  Current examination: Flushes/aspirates easily.  Insertion site unremarkable. Suture and stat lock in place. Dressed appropriately.   Plan: Continue TID flushes with 5 cc NS. Record output Q shift. Dressing changes QD or PRN if soiled.  Call IR APP or on call IR MD if difficulty flushing or sudden change in drain output.  Repeat imaging/possible drain injection once output < 10 mL/QD (excluding flush material). Consideration for drain removal if output is < 10 mL/QD (excluding flush material),  pending discussion with the providing surgical service.  Discharge planning: Please contact IR APP or on call IR MD prior to patient d/c to ensure appropriate follow up plans are in place. Typically patient will follow up with IR clinic 10-14 days post d/c for repeat imaging/possible drain injection. IR scheduler will contact patient with date/time of appointment. Patient will need to flush drain QD with 5 cc NS, record output QD, dressing changes every 2-3 days or earlier if soiled.   IR will continue to follow - please call with questions or concerns.     Electronically Signed: Lavanda JAYSON Jurist, PA-C 03/31/2024, 10:04 AM    I spent a total of 15 Minutes at the the patient's  bedside AND on the patient's hospital floor or unit, greater than 50% of which was counseling/coordinating care for image guided percutaneous abscess drain placement.

## 2024-03-31 NOTE — Plan of Care (Signed)
  Problem: Clinical Measurements: Goal: Ability to maintain clinical measurements within normal limits will improve Outcome: Progressing Goal: Will remain free from infection Outcome: Progressing Goal: Diagnostic test results will improve Outcome: Progressing Goal: Respiratory complications will improve Outcome: Progressing Goal: Cardiovascular complication will be avoided Outcome: Progressing   Problem: Activity: Goal: Risk for activity intolerance will decrease Outcome: Progressing   Problem: Coping: Goal: Level of anxiety will decrease Outcome: Progressing   Problem: Elimination: Goal: Will not experience complications related to bowel motility Outcome: Progressing   Problem: Pain Managment: Goal: General experience of comfort will improve and/or be controlled Outcome: Progressing   Problem: Safety: Goal: Ability to remain free from injury will improve Outcome: Progressing   Problem: Skin Integrity: Goal: Risk for impaired skin integrity will decrease Outcome: Progressing   Problem: Education: Goal: Ability to describe self-care measures that may prevent or decrease complications (Diabetes Survival Skills Education) will improve Outcome: Progressing   Problem: Coping: Goal: Ability to adjust to condition or change in health will improve Outcome: Progressing   Problem: Fluid Volume: Goal: Ability to maintain a balanced intake and output will improve Outcome: Progressing   Problem: Health Behavior/Discharge Planning: Goal: Ability to identify and utilize available resources and services will improve Outcome: Progressing Goal: Ability to manage health-related needs will improve Outcome: Progressing   Problem: Metabolic: Goal: Ability to maintain appropriate glucose levels will improve Outcome: Progressing   Problem: Nutritional: Goal: Maintenance of adequate nutrition will improve Outcome: Progressing Goal: Progress toward achieving an optimal weight will  improve Outcome: Progressing   Problem: Skin Integrity: Goal: Risk for impaired skin integrity will decrease Outcome: Progressing   Problem: Tissue Perfusion: Goal: Adequacy of tissue perfusion will improve Outcome: Progressing   Problem: Education: Goal: Knowledge of the prescribed therapeutic regimen will improve Outcome: Progressing   Problem: Self-Concept: Goal: Communication of feelings regarding changes in body function or appearance will improve Outcome: Progressing   Problem: Skin Integrity: Goal: Demonstration of wound healing without infection will improve Outcome: Progressing   Problem: Activity: Goal: Ability to tolerate increased activity will improve Outcome: Progressing   Problem: Respiratory: Goal: Ability to maintain a clear airway and adequate ventilation will improve Outcome: Progressing   Problem: Role Relationship: Goal: Method of communication will improve Outcome: Progressing

## 2024-03-31 NOTE — TOC Progression Note (Addendum)
 Transition of Care [facility]) - Progression Note    Patient Details  Name: [name] MRN: [id number] Date of Birth: [birth year]  [facility] CM/SW Contact  [name], RN Phone [phone]  03/31/2024, 2:58 PM  Clinical Narrative:    Cm at bedside to present bed offers to patient. CM has made patient aware that [facility] and [facility] have not extended bed offers. Patient states that she does not know of another facility that she would want to go to. List has been left with patient to review with daughter. Patient states that daughter in on the way to hospital.   1553 CM attempted to initiate insurance insurance auth without facility name. [employer] will not allow auth without facility name. Awaiting facility choice from patient and daughter.    Expected Discharge Plan: Home/Self Care Barriers to Discharge: Continued Medical Work up               Expected Discharge Plan and Services In-house Referral: NA Discharge Planning Services: CM Consult Post Acute Care Choice: NA Living arrangements for the past 2 months: Single Family Home                 DME Arranged: N/A DME Agency: NA       HH Arranged: NA HH Agency: NA         Social Drivers of Health (SDOH) Interventions SDOH Screenings   Food Insecurity: No Food Insecurity (02/29/2024)  Housing: High Risk (02/29/2024)  Transportation Needs: No Transportation Needs (02/29/2024)  Utilities: Not At Risk (02/29/2024)  Recent Concern: Utilities - At Risk (02/09/2024)  Social Connections: Patient Declined (02/29/2024)  Tobacco Use: Medium Risk (03/01/2024)    Readmission Risk Interventions    03/02/2024    3:05 PM  Readmission Risk Prevention Plan  Transportation Screening Complete  PCP or Specialist Appt within 3-5 Days Complete  HRI or Home Care Consult Complete  Social Work Consult for Recovery Care Planning/Counseling Complete  Palliative Care Screening Not Applicable  Medication Review Special educational needs teacher) Complete

## 2024-03-31 NOTE — Progress Notes (Signed)
 Mobility Specialist - Progress Note   03/31/24 0914  Mobility  Activity Ambulated with assistance  Level of Assistance Minimal assist, patient does 75% or more  Assistive Device Front wheel walker  Distance Ambulated (ft) 100 ft  Range of Motion/Exercises Active  Activity Response Tolerated well  Mobility Referral Yes  Mobility visit 1 Mobility  Mobility Specialist Start Time (ACUTE ONLY) 0900  Mobility Specialist Stop Time (ACUTE ONLY) 0914  Mobility Specialist Time Calculation (min) (ACUTE ONLY) 14 min   Pt was found in bed and agreeable to ambulate. Stated having the jitters this morning. Grew fatigued and returned to bed with all needs met. Call bell in reach and bed alarm on.   Erminio Leos,  Mobility Specialist Can be reached via Secure Chat

## 2024-03-31 NOTE — Progress Notes (Signed)
 GYN Oncology Progress Note  30 Days Post-Op Procedure(s) (LRB): LAPAROTOMY, EXPLORATORY (N/A) SIGMOIDOSCOPY COLECTOMY, WITH COLOSTOMY CREATION MOBILIZATION, SPLENIC FLEXURE, WITH PARTIAL COLECTOMY CYSTOSCOPY  Subjective: Patient reports having a good night and sleeping well. Ate cereal for breakfast. No nausea or emesis. Has mild tremor of hands which she had earlier in hospital stay. Denies chest pain, dyspnea. Walked in the halls this am and tolerated this well. Having mild abdominal pain at this time and has not needed to take prn meds. Denies chest pain, dyspnea.   Objective: Vital signs in last 24 hours: Temp:  [97.9 F (36.6 C)-98.3 F (36.8 C)] 97.9 F (36.6 C) (08/01 0458) Pulse Rate:  [94-100] 94 (08/01 0458) Resp:  [16] 16 (08/01 0458) BP: (101-153)/(72-81) 101/80 (08/01 0458) SpO2:  [97 %-99 %] 99 % (08/01 0458) Weight:  [154 lb 15.7 oz (70.3 kg)] 154 lb 15.7 oz (70.3 kg) (08/01 0458) Last BM Date : 03/30/24  Intake/Output from previous day: 07/31 0701 - 08/01 0700 In: 367.7 [P.O.:120; IV Piggyback:247.7] Out: 1400 [Urine:1350; Drains:50] yellow urine currently in the canister  Physical Examination: General: sleeping, easily arousable, oriented when awake, in no acute distress, in bed, comfortable Respiratory: Normal work of breathing. Lungs clear to auscultation bilaterally without rhonchi. Cardiovascular: Regular sinus rhythm and rate, no murmurs or rubs. Abdomen: soft, + BS, less tender to palpation diffusely (stable). Incision: midline abdominal open incision is covered with dry dressing. See media for photo of wound from 7/31. Left paracolic gutter drain with purulent tan-red drainage. Dry dressing around drain. Ostomy viable, bag with moderate soft stool/ flatus.    Extremities: extremities warm and well perfused, no edema, heals are dry, no sign of skin breakdown Gu: Purewick in place with yellow urine Skin on back intact, skin at the sacrum moderately dry,  small 1 mm abrasion from scratching noted in sacral area, no evidence of pressure ulcer  Labs: Drain culture from 7/24 - few e coli, rare pseudomonas, rare bacteroides thetaiotaomicron and bacteroides caccae  Assessment: 78 y.o. s/p Procedure(s): LAPAROTOMY, EXPLORATORY SIGMOIDOSCOPY COLECTOMY, WITH COLOSTOMY CREATION MOBILIZATION, SPLENIC FLEXURE, WITH PARTIAL COLECTOMY CYSTOSCOPY: Awaiting SNF offers. Will plan for repeat CT imaging prior to discharge to SNF. Working on increasing PO intake, physical conditioning   Post-op: -Case manager/SW initiating SNF search/placement. -negative pressure wound therapy stopped yesterday with transition to twice daily wet to dry dressing changes.  -Appreciate ID input.  -continue to work on being up to chair, out of bed with assistance. PT/OT -Intake improving slowly. Has had poor PO intake since TPN discontinued on 7/21.  - continue Remeron  - having bowel function  - purewick in place with adequate output -At discharge to SNF, patient will need twice daily vs daily wet to dry dressing changes to the midline abdominal incision and will need to continue IV antibiotics/PICC line care (until Apr 20, 2024). Will also need ostomy care/assistance and continued physical therapy.   Intra-abdominal infection: 7/10 culture with e coli and enterococcus 7/14 incision culture with e coli and pseudomonas  7/24 culture from IR drain: ecoli and pseudomonas, both sensitive to imipenem.  Current antibiotic regimen: meropenem  (7/17). Linezolid  (started 7/14) discontinued per ID rec. - CT on 7/23 with 8 cm left para-colic gutter fluid collection concerning for abscess. IR drain placement on 7/24 (removed JP from surgery at the time of this drain placement) - ID following. Appreciate recommendations. Per Dr. [name], recommendation to continue with meropenem  at discharge - leukocytosis resolved, WBC 6.7 this am.  Severe  calorie-deficient nutrition: Continue to  provide encouragement.  - continue TID supplements - calorie counts to discontinue after discussion with Dr. [name] and RD. - consistency of intake continues to be a challenge - continue with intake and output Albumin  2.2 on recent Cmet   Hyponatremia: - asymptomatic, stable   Acute on chronic anemia: - asymptomatic, continue to monitor   Thickened endometrium: - failed endometrial sampling on multiple attempts (hysteroscopic and under intra-abdominal visualization) - no definitive malignancy or hyperplasia diagnosis   Type 2 diabetes mellitus:  - Discussion with Alexandria Durham, pharmacist-plan to discontinue CBGs and sliding scale since CBGs have been normal (stable) and she has not required sliding scale coverage on a regular basis. On home oral med.   Prophylaxis:  - SCDs, lovenox    LOS: 31 days    Alexandria Durham 03/31/2024, 6:57 AM

## 2024-04-01 NOTE — Plan of Care (Signed)
  Problem: Clinical Measurements: Goal: Ability to maintain clinical measurements within normal limits will improve Outcome: Progressing Goal: Will remain free from infection Outcome: Progressing Goal: Diagnostic test results will improve Outcome: Progressing Goal: Respiratory complications will improve Outcome: Progressing Goal: Cardiovascular complication will be avoided Outcome: Progressing   Problem: Activity: Goal: Risk for activity intolerance will decrease Outcome: Progressing   Problem: Coping: Goal: Level of anxiety will decrease Outcome: Progressing   Problem: Elimination: Goal: Will not experience complications related to bowel motility Outcome: Progressing   Problem: Pain Managment: Goal: General experience of comfort will improve and/or be controlled Outcome: Progressing   Problem: Safety: Goal: Ability to remain free from injury will improve Outcome: Progressing   Problem: Skin Integrity: Goal: Risk for impaired skin integrity will decrease Outcome: Progressing   Problem: Education: Goal: Ability to describe self-care measures that may prevent or decrease complications (Diabetes Survival Skills Education) will improve Outcome: Progressing   Problem: Coping: Goal: Ability to adjust to condition or change in health will improve Outcome: Progressing   Problem: Fluid Volume: Goal: Ability to maintain a balanced intake and output will improve Outcome: Progressing   Problem: Health Behavior/Discharge Planning: Goal: Ability to identify and utilize available resources and services will improve Outcome: Progressing Goal: Ability to manage health-related needs will improve Outcome: Progressing   Problem: Metabolic: Goal: Ability to maintain appropriate glucose levels will improve Outcome: Progressing   Problem: Nutritional: Goal: Maintenance of adequate nutrition will improve Outcome: Progressing Goal: Progress toward achieving an optimal weight will  improve Outcome: Progressing   Problem: Skin Integrity: Goal: Risk for impaired skin integrity will decrease Outcome: Progressing   Problem: Tissue Perfusion: Goal: Adequacy of tissue perfusion will improve Outcome: Progressing   Problem: Education: Goal: Knowledge of the prescribed therapeutic regimen will improve Outcome: Progressing   Problem: Self-Concept: Goal: Communication of feelings regarding changes in body function or appearance will improve Outcome: Progressing   Problem: Skin Integrity: Goal: Demonstration of wound healing without infection will improve Outcome: Progressing   Problem: Activity: Goal: Ability to tolerate increased activity will improve Outcome: Progressing   Problem: Respiratory: Goal: Ability to maintain a clear airway and adequate ventilation will improve Outcome: Progressing   Problem: Role Relationship: Goal: Method of communication will improve Outcome: Progressing

## 2024-04-01 NOTE — Progress Notes (Signed)
 GYN Oncology Progress Note  31 Days Post-Op Procedure(s) (LRB): LAPAROTOMY, EXPLORATORY (N/A) SIGMOIDOSCOPY COLECTOMY, WITH COLOSTOMY CREATION MOBILIZATION, SPLENIC FLEXURE, WITH PARTIAL COLECTOMY CYSTOSCOPY  Subjective: Ambulating in the hall.  Ate 100% of dinner  Objective: Vital signs in last 24 hours: Temp:  [98.2 F (36.8 C)-98.7 F (37.1 C)] 98.4 F (36.9 C) (08/02 0547) Pulse Rate:  [90-105] 90 (08/02 0547) Resp:  [16-20] 16 (08/02 0547) BP: (108-166)/(68-82) 166/79 (08/02 0547) SpO2:  [96 %-100 %] 96 % (08/02 0547) Last BM Date : 03/31/24  Intake/Output from previous day: 08/01 0701 - 08/02 0700 In: 1148.3 [P.O.:948; I.V.:10; IV Piggyback:185.3] Out: 1495 [Urine:1100; Drains:95; Stool:300]   Physical Examination: General: sleeping, easily arousable, oriented when awake, in no acute distress, in bed, comfortable Respiratory: Normal work of breathing. Lungs clear to auscultation bilaterally without rhonchi. Cardiovascular: Regular sinus rhythm and rate, no murmurs or rubs. Abdomen: soft, + BS, less tender to palpation diffusely (stable). Incision: midline abdominal open incision is covered with dry dressing.  Left paracolic gutter drain with purulent tan-red drainage. Dry dressing around drain. Ostomy viable, bag with moderate soft stool/ flatus.    Extremities: extremities warm and well perfused, no edema, heals are dry, no sign of skin breakdown Gu: Purewick in place with yellow urine    Assessment: 78 y.o. s/p Procedure(s): LAPAROTOMY, EXPLORATORY SIGMOIDOSCOPY COLECTOMY, WITH COLOSTOMY CREATION MOBILIZATION, SPLENIC FLEXURE, WITH PARTIAL COLECTOMY CYSTOSCOPY: Awaiting SNF offers.   Increasing PO intake, improving physical conditioning   Post-op:  -Continue twice daily wet to dry dressing changes.  - continue Remeron  -Current antibiotic regimen: meropenem  (7/17). Linezolid  (started 7/14) discontinued per ID rec.  required sliding scale coverage on a  regular basis. On home oral med.   VTE Prophylaxis:  - SCDs, lovenox    LOS: 32 days    Olam Mill, MD 04/01/2024, 10:42 AM

## 2024-04-01 NOTE — Progress Notes (Signed)
 RNCM entered chart to find out terms of confidentiality (who can know where pt is and/or what information can be shared).  Terms not readily accessible; RNCM reached out to bedside RN.  Bedside RN suggests having family member transferred to her.  RNCM transferred call to bedside RN.  Markeia Harkless J. Debarah, BSN, RN, [facility]  IP Care Management  Nurse Case Manager  [facility] Emergency Departments  Operative Services  [phone]

## 2024-04-02 NOTE — Progress Notes (Signed)
 GYN Oncology Progress Note  32 Days Post-Op Procedure(s) (LRB): LAPAROTOMY, EXPLORATORY (N/A) SIGMOIDOSCOPY COLECTOMY, WITH COLOSTOMY CREATION MOBILIZATION, SPLENIC FLEXURE, WITH PARTIAL COLECTOMY CYSTOSCOPY  Subjective: Ambulating in hall; ate > 75% of dinner; persistent episodic abdominal pain  Objective: Vital signs in last 24 hours: Temp:  [97.9 F (36.6 C)-98.5 F (36.9 C)] 97.9 F (36.6 C) (08/03 0557) Pulse Rate:  [100-103] 103 (08/03 0557) Resp:  [15-18] 18 (08/03 0557) BP: (117-133)/(58-95) 119/58 (08/03 0557) SpO2:  [96 %-100 %] 97 % (08/03 0557) Weight:  [70.7 kg] 70.7 kg (08/03 0219) Last BM Date : 04/02/24  Intake/Output from previous day: 08/02 0701 - 08/03 0700 In: 725 [P.O.:720] Out: 615 [Urine:600; Drains:15]   Physical Examination: General: sleeping, easily arousable, oriented when awake, in no acute distress, in bed, comfortable Respiratory: Normal work of breathing. Lungs clear to auscultation bilaterally without rhonchi. Cardiovascular: Regular sinus rhythm and rate, no murmurs or rubs. Abdomen: soft, + BS, less tender to palpation diffusely (stable). Incision: midline abdominal open incision is covered with dry dressing.  Left paracolic gutter drain with purulent tan-red drainage. Dry dressing around drain. Ostomy viable, bag with moderate soft stool/ flatus.    Extremities: extremities warm and well perfused, no edema, heals are dry, no sign of skin breakdown     Assessment: 78 y.o. s/p Procedure(s): LAPAROTOMY, EXPLORATORY SIGMOIDOSCOPY COLECTOMY, WITH COLOSTOMY CREATION MOBILIZATION, SPLENIC FLEXURE, WITH PARTIAL COLECTOMY CYSTOSCOPY: Awaiting SNF offers.   Increasing PO intake, improving physical conditioning   Post-op:  -Continue twice daily wet to dry dressing changes.  - Continue Remeron  - Continue OOB -Current antibiotic regimen: meropenem  (7/17).   VTE Prophylaxis:  - SCDs, lovenox    LOS: 33 days    Olam Mill,  MD 04/02/2024, 11:09 AM

## 2024-04-02 NOTE — Plan of Care (Signed)
  Problem: Clinical Measurements: Goal: Ability to maintain clinical measurements within normal limits will improve Outcome: Progressing Goal: Will remain free from infection Outcome: Progressing Goal: Diagnostic test results will improve Outcome: Progressing Goal: Respiratory complications will improve Outcome: Progressing Goal: Cardiovascular complication will be avoided Outcome: Progressing   Problem: Activity: Goal: Risk for activity intolerance will decrease Outcome: Progressing   Problem: Coping: Goal: Level of anxiety will decrease Outcome: Progressing   Problem: Elimination: Goal: Will not experience complications related to bowel motility Outcome: Progressing   Problem: Pain Managment: Goal: General experience of comfort will improve and/or be controlled Outcome: Progressing   Problem: Safety: Goal: Ability to remain free from injury will improve Outcome: Progressing   Problem: Skin Integrity: Goal: Risk for impaired skin integrity will decrease Outcome: Progressing   Problem: Education: Goal: Ability to describe self-care measures that may prevent or decrease complications (Diabetes Survival Skills Education) will improve Outcome: Progressing   Problem: Coping: Goal: Ability to adjust to condition or change in health will improve Outcome: Progressing   Problem: Fluid Volume: Goal: Ability to maintain a balanced intake and output will improve Outcome: Progressing   Problem: Health Behavior/Discharge Planning: Goal: Ability to identify and utilize available resources and services will improve Outcome: Progressing Goal: Ability to manage health-related needs will improve Outcome: Progressing   Problem: Metabolic: Goal: Ability to maintain appropriate glucose levels will improve Outcome: Progressing   Problem: Nutritional: Goal: Maintenance of adequate nutrition will improve Outcome: Progressing Goal: Progress toward achieving an optimal weight will  improve Outcome: Progressing   Problem: Skin Integrity: Goal: Risk for impaired skin integrity will decrease Outcome: Progressing   Problem: Tissue Perfusion: Goal: Adequacy of tissue perfusion will improve Outcome: Progressing   Problem: Education: Goal: Knowledge of the prescribed therapeutic regimen will improve Outcome: Progressing   Problem: Self-Concept: Goal: Communication of feelings regarding changes in body function or appearance will improve Outcome: Progressing   Problem: Skin Integrity: Goal: Demonstration of wound healing without infection will improve Outcome: Progressing   Problem: Activity: Goal: Ability to tolerate increased activity will improve Outcome: Progressing   Problem: Respiratory: Goal: Ability to maintain a clear airway and adequate ventilation will improve Outcome: Progressing   Problem: Role Relationship: Goal: Method of communication will improve Outcome: Progressing

## 2024-04-03 ENCOUNTER — Telehealth: Payer: Self-pay | Admitting: Gynecologic Oncology

## 2024-04-03 ENCOUNTER — Other Ambulatory Visit: Payer: Self-pay | Admitting: Psychiatry

## 2024-04-03 DIAGNOSIS — T8149XA Infection following a procedure, other surgical site, initial encounter: Secondary | ICD-10-CM

## 2024-04-03 NOTE — Progress Notes (Signed)
 Referring Physician(s): Tucker,K  Supervising Physician: Philip Cornet  Patient Status:  [facility] - In-pt  Chief Complaint: Abdominal pain, postop left paracolic gutter abscess;status post drain placement on 03/23/2024   Subjective: Patient without new complaints, still has some abdominal discomfort; denies fever, nausea, vomiting; currently eating   Allergies: Ace inhibitors, Tape, Aspirin, and Codeine  Medications: Prior to Admission medications   Medication Sig Start Date End Date Taking? Authorizing Provider  bisacodyl  5 MG EC tablet Take 5 mg by mouth daily as needed for moderate constipation.   Yes [provider]  buPROPion  (WELLBUTRIN  XL) 150 MG 24 hr tablet Take 150 mg by mouth every evening.   Yes [provider]  Cholecalciferol  (VITAMIN D3) 25 MCG (1000 UT) CAPS Take 1,000 Units by mouth daily.   Yes [provider]  Cyanocobalamin  (VITAMIN B-12 PO) Take 1 tablet by mouth in the morning.   Yes [provider]  diphenhydramine -acetaminophen  (TYLENOL  PM) 25-500 MG TABS tablet Take 2 tablets by mouth at bedtime as needed (Sleep/Pain).   Yes [provider]  ezetimibe  (ZETIA ) 10 MG tablet Take 10 mg by mouth daily. 10/29/23 10/28/24 Yes [provider]  ferrous sulfate 325 (65 FE) MG tablet Take 325 mg by mouth daily with breakfast.   Yes [provider]  fluticasone  (FLONASE ) 50 MCG/ACT nasal spray Place 2 sprays into both nostrils daily as needed for rhinitis. 02/15/24  Yes Cheryle Page, MD  hydrochlorothiazide  (HYDRODIURIL ) 25 MG tablet Take 1 tablet (25 mg total) by mouth daily. 01/10/24  Yes Barbarann Nest, MD  levothyroxine  (SYNTHROID ) 75 MCG tablet Take 75 mcg by mouth daily before breakfast. 09/22/22  Yes [provider]  losartan  (COZAAR ) 50 MG tablet Take 50 mg by mouth in the morning.   Yes [provider]  magnesium  oxide (MAG-OX) 400 MG tablet Take 400 mg by mouth daily. 03/01/17  Yes  [provider]  methocarbamol  (ROBAXIN ) 500 MG tablet Take 1 tablet (500 mg total) by mouth 2 (two) times daily as needed for muscle spasms. 05/25/21  Yes Joldersma, Logan, PA-C  mometasone (ELOCON) 0.1 % cream Apply 1 Application topically daily as needed (Dermatitis- affected sites). 10/27/23  Yes [provider]  omeprazole (PRILOSEC) 40 MG capsule Take 40 mg by mouth daily before breakfast.   Yes [provider]  polycarbophil (FIBERCON) 625 MG tablet Take 1 tablet (625 mg total) by mouth 2 (two) times daily. 01/09/24  Yes Barbarann Nest, MD  polyethylene glycol powder (GLYCOLAX /MIRALAX ) 17 GM/SCOOP powder Take 17 g by mouth 2 (two) times daily as needed for moderate constipation. 08/15/15  Yes [provider]  sitaGLIPtin (JANUVIA) 50 MG tablet Take 50 mg by mouth daily. 10/28/22  Yes [provider]  triamcinolone ointment (KENALOG) 0.5 % Apply 1 Application topically 2 (two) times daily as needed (for itching).   Yes [provider]  TYLENOL  8 HOUR ARTHRITIS PAIN 650 MG CR tablet Take 650-1,300 mg by mouth every 8 (eight) hours as needed for pain.   Yes [provider]  ondansetron  (ZOFRAN ) 4 MG tablet Take 1 tablet (4 mg total) by mouth every 6 (six) hours as needed for nausea. 02/15/24   Cheryle Page, MD     Vital Signs: BP 116/68 (BP Location: Left Arm)   Pulse 96   Temp 99.5 F (37.5 C) (Oral)   Resp 20   Ht 5' 4 (1.626 m)   Wt 155 lb 13.8 oz (70.7 kg)   SpO2 96%  BMI 26.75 kg/m   Physical Exam awake, alert.  Left lower quadrant drain intact, insertion site okay, mildly tender to palpation, output 55 cc of thick cream-colored fluid.  Drain irrigated without difficulty.  Imaging: No results found.  Labs:  CBC: Recent Labs    03/22/24 0812 03/24/24 0540 03/26/24 1313 03/30/24 0438  WBC 10.1 10.4 7.4 6.7  HGB 8.7* 8.9* 8.7* 7.9*  HCT 27.5* 27.1* 27.1* 24.9*  PLT 584* 605* 601* 452*    COAGS: No  results for input(s): INR, APTT in the last 8760 hours.  BMP: Recent Labs    03/24/24 0540 03/25/24 1221 03/26/24 1313 03/30/24 0438  NA 130* 132* 132* 136  K 4.2 4.2 4.3 4.2  CL 96* 98 97* 101  CO2 25 24 25 24   GLUCOSE 96 174* 113* 109*  BUN 16 17 15 14   CALCIUM  9.1 8.9 8.9 9.4  CREATININE 0.92 0.97 1.09* 0.57  GFRNONAA >60 >60 53* >60    LIVER FUNCTION TESTS: Recent Labs    03/17/24 0711 03/20/24 0525 03/26/24 1313 03/30/24 0438  BILITOT 0.5 0.6 0.3 0.4  AST 59* 44* 31 25  ALT 85* 68* 41 30  ALKPHOS 129* 100 88 67  PROT 6.4* 6.0* 7.0 6.7  ALBUMIN  1.8* 1.7* 2.1* 2.2*    Assessment and Plan: Patient with history abdominal pain, postop left paracolic gutter abscess, status post drain placement on 03/23/2024; temp 99.5, drain fl cultures with few E. coli, rare Pseudomonas aeruginosa, rare Bacteroides; no new labs today; continue drain irrigation, close output monitoring, dressing changes every 2 to 3 days; consider follow-up CT in the next 48 to 72 hours to assess adequacy of drainage (last CT done 7/24); will likely also need drain injection before considering removal   Electronically Signed: D. Franky Rakers, PA-C 04/03/2024, 4:45 PM   I spent a total of 15 Minutes at the the patient's bedside AND on the patient's hospital floor or unit, greater than 50% of which was counseling/coordinating care for left paracolic gutter abscess drain    Patient ID: Alexandria Durham, female   DOB: 1945/09/05, 78 y.o.   MRN: [id number]

## 2024-04-03 NOTE — Progress Notes (Signed)
 Occupational Therapy Treatment Patient Details Name: Alexandria Durham MRN: [id number] DOB: Aug 05, 1946 Today's Date: 04/03/2024   History of present illness Patient is a 78 year old female who presented on 03/01/2024 following a D and C on 7/1 for thickened endometrium and procedure complicated due to bowel perforation. Pt developed signs and symptoms of sepsis and shock. 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  Pt required post op ventilation and extubated on 7/4. Pt had removal of surgical drain and s/p left paracolic abscess drain placement on 03/23/24 by IR. Wound vac removed 03/30/24. Pt PMH includes but is not limited to:  recent hospital admissions for diverticulitis, pancreatitis, abdominal pain, D&C  01/06/24 with uterine perforation, cholecystectomy and d&C on 01/17/24, DM II, HTN, COPD, GERD and CKD III.   OT comments  Patient seen for skilled OT this afternoon. Progressing steadily with all BADL's and mobility including standing intervals with seated rests for light grooming, use of RW for accessing toilet with support for balance.  Patient requires continued Acute care hospital level OT services to progress safety and functional performance and allow for discharge. Patient will benefit from continued inpatient follow up therapy, <3 hours/day.         If plan is discharge home, recommend the following:  Assistance with cooking/housework;Assist for transportation;Help with stairs or ramp for entrance;A lot of help with bathing/dressing/bathroom;A little help with walking and/or transfers   Equipment Recommendations  Other (comment)       Precautions / Restrictions Precautions Precautions: Fall Recall of Precautions/Restrictions: Intact Precaution/Restrictions Comments: Abdominal sx -  JP drain, ostomy Required Braces or Orthoses: Other Brace Other Brace: ABD binder is discontinued Restrictions Weight Bearing Restrictions Per Provider Order: No Other Position/Activity  Restrictions: JP drain       Mobility Bed Mobility Overal bed mobility: Needs Assistance Bed Mobility: Sit to Supine, Rolling Rolling: Contact guard assist     Sit to supine: Min assist, HOB elevated, Used rails   General bed mobility comments: for LE management    Transfers Overall transfer level: Needs assistance Equipment used: Rolling walker (2 wheels) Transfers: Sit to/from Stand, Bed to chair/wheelchair/BSC Sit to Stand: From elevated surface, Contact guard assist Stand pivot transfers: Contact guard assist   Step pivot transfers: Contact guard assist     General transfer comment: min cues for foot and hand placement to power up     Balance Overall balance assessment: Needs assistance Sitting-balance support: Feet supported Sitting balance-Leahy Scale: Fair     Standing balance support: Bilateral upper extremity supported, During functional activity, Reliant on assistive device for balance Standing balance-Leahy Scale: Fair Standing balance comment: CGA to min assist with RW                           ADL either performed or assessed with clinical judgement   ADL Overall ADL's : Needs assistance/impaired Eating/Feeding: Modified independent;Sitting   Grooming: Wash/dry hands;Wash/dry face;Oral care;Modified independent;Sitting   Upper Body Bathing: Contact guard assist;Sitting Upper Body Bathing Details (indicate cue type and reason): excluding binder Lower Body Bathing: Moderate assistance;Sitting/lateral leans   Upper Body Dressing : Contact guard assist;Sitting   Lower Body Dressing: Moderate assistance;Sitting/lateral leans   Toilet Transfer: Rolling walker (2 wheels);Contact guard assist   Toileting- Clothing Manipulation and Hygiene: Sitting/lateral lean;Maximal assistance Toileting - Clothing Manipulation Details (indicate cue type and reason): post voiding on commode set up, max A new colostomy     Functional  mobility during ADLs:  Contact guard assist;Rolling walker (2 wheels) General ADL Comments: ostomy and jp drain now only, wound vac d/c'd, abdominal binder assist required    Extremity/Trunk Assessment Upper Extremity Assessment Upper Extremity Assessment: Right hand dominant RUE Deficits / Details: patient reports hx of RTC issues with ~ 0-55 degrees active shoulder ROM, enough to reach face with wash cloth and complete full grasp ~ 4-/5 distal gross strength RUE Coordination: decreased gross motor;decreased fine motor LUE Deficits / Details: patient reports hx of L RTC issues with ~ 0-20 degrees shoulder AROM with 3/5 gross grasp stregnth, unable to reach to face LUE Coordination: decreased fine motor;decreased gross motor   Lower Extremity Assessment Lower Extremity Assessment: Defer to PT evaluation        Vision   Vision Assessment?: No apparent visual deficits         Communication Communication Communication: No apparent difficulties   Cognition Arousal: Alert Behavior During Therapy: WFL for tasks assessed/performed Cognition: Cognition impaired     Awareness: Online awareness impaired Memory impairment (select all impairments): Short-term memory Attention impairment (select first level of impairment): Divided attention Executive functioning impairment (select all impairments): Problem solving OT - Cognition Comments: improving with very mild deficts noted but fatigues easily contributing to mental capacity for high level attention                 Following commands: Intact        Cueing   Cueing Techniques: Verbal cues        General Comments wound vac has been d/c'd, jp darain in place    Pertinent Vitals/ Pain       Pain Assessment Pain Assessment: Faces Pain Score: 2  Pain Location: abdomen Pain Descriptors / Indicators: Sore Pain Intervention(s): Premedicated before session, Monitored during session, Repositioned   Frequency  Min 2X/week        Progress  Toward Goals  OT Goals(current goals can now be found in the care plan section)  Progress towards OT goals: Progressing toward goals  Acute Rehab OT Goals Patient Stated Goal: to get to rehab OT Goal Formulation: With patient Time For Goal Achievement: 04/07/24 Potential to Achieve Goals: Good ADL Goals Pt Will Perform Eating: with set-up;sitting Pt Will Perform Grooming: with set-up;sitting Pt Will Perform Upper Body Bathing: with set-up;sitting Pt Will Perform Upper Body Dressing: with min assist;sitting Pt Will Transfer to Toilet: with min assist;bedside commode Pt/caregiver will Perform Home Exercise Program: Both right and left upper extremity;With Supervision;With written HEP provided;With theraputty  Plan         AM-PAC OT 6 Clicks Daily Activity     Outcome Measure   Help from another person eating meals?: None Help from another person taking care of personal grooming?: None Help from another person toileting, which includes using toliet, bedpan, or urinal?: A Lot Help from another person bathing (including washing, rinsing, drying)?: A Lot Help from another person to put on and taking off regular upper body clothing?: A Little Help from another person to put on and taking off regular lower body clothing?: A Lot 6 Click Score: 17    End of Session Equipment Utilized During Treatment: Rolling walker (2 wheels);Gait belt  OT Visit Diagnosis: Unsteadiness on feet (R26.81);Muscle weakness (generalized) (M62.81);Other abnormalities of gait and mobility (R26.89) Pain - part of body:  (abdomen)   Activity Tolerance Patient tolerated treatment well   Patient Left in bed;with call bell/phone within reach;with bed alarm set   Nurse  Communication Mobility status;Other (comment) (voiding data entered in flowsheets)        Time: 1401-1430 OT Time Calculation (min): 29 min  Charges: OT General Charges $OT Visit: 1 Visit OT Treatments $Self Care/Home Management : 8-22  mins $Therapeutic Activity: 8-22 mins  Amel Kitch OT/L Acute Rehabilitation Department  [phone]  04/03/2024, 2:55 PM

## 2024-04-03 NOTE — Progress Notes (Signed)
 Physical Therapy Treatment Patient Details Name: Alexandria Durham MRN: [id number] DOB: 1945-11-28 Today's Date: 04/03/2024   History of Present Illness Patient is a 78 year old female who presented on 03/01/2024 following a D and C on 7/1 for thickened endometrium and procedure complicated due to bowel perforation. Pt developed signs and symptoms of sepsis and shock. 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  Pt required post op ventilation and extubated on 7/4. Pt had removal of surgical drain and s/p left paracolic abscess drain placement on 03/23/24 by IR. Wound vac removed 03/30/24. Pt PMH includes but is not limited to:  recent hospital admissions for diverticulitis, pancreatitis, abdominal pain, D&C  01/06/24 with uterine perforation, cholecystectomy and d&C on 01/17/24, DM II, HTN, COPD, GERD and CKD III.    PT Comments  The patient is eager to ambulate, slight increase in distance this visit.  [name] ue mobility. Patient will benefit from continued inpatient follow up therapy, <3 hours/day    If plan is discharge home, recommend the following: A little help with walking and/or transfers;A little help with bathing/dressing/bathroom;Assistance with cooking/housework;Assist for transportation;Help with stairs or ramp for entrance   Can travel by private vehicle        Equipment Recommendations       Recommendations for Other Services       Precautions / Restrictions Precautions Precautions: Fall Precaution/Restrictions Comments: Abdominal sx -  JP drain, ostomy Required Braces or Orthoses: Other Brace Other Brace: ABD binder is discontinued     Mobility  Bed Mobility   Bed Mobility: Rolling, Sidelying to Sit Rolling: Contact guard assist Sidelying to sit: Contact guard assist, Used rails       General bed mobility comments: cues for log rolling, CGA with bedrail use to roll onto side then right up into sitting,Patient asking for assistance with trunk increased time  and effort, cues for exhalation    Transfers   Equipment used: Rolling walker (2 wheels) Transfers: Sit to/from Stand Sit to Stand: From elevated surface, Contact guard assist           General transfer comment: cues for hand placement, min A to power up from bedside, slow to rise,    Ambulation/Gait Ambulation/Gait assistance: Min assist Gait Distance (Feet): 120 Feet Assistive device: Rolling walker (2 wheels) Gait Pattern/deviations: Decreased step length - right, Decreased step length - left, Decreased stride length Gait velocity: decreased     General Gait Details: slow pace, steady with RW   Stairs             Wheelchair Mobility     Tilt Bed    Modified Rankin (Stroke Patients Only)       Balance Overall balance assessment: Needs assistance Sitting-balance support: Feet supported Sitting balance-Leahy Scale: Fair     Standing balance support: Bilateral upper extremity supported, During functional activity, Reliant on assistive device for balance Standing balance-Leahy Scale: Fair                              Communication    Cognition Arousal: Alert Behavior During Therapy: WFL for tasks assessed/performed   PT - Cognitive impairments: No apparent impairments                       PT - Cognition Comments: pt alert, cheerful and conversational        Cueing    Exercises  General Comments        Pertinent Vitals/Pain Pain Assessment Faces Pain Scale: Hurts a little bit Pain Location: abdomen Pain Descriptors / Indicators: Sore Pain Intervention(s): Premedicated before session    Home Living                          Prior Function            PT Goals (current goals can now be found in the care plan section) Progress towards PT goals: Progressing toward goals    Frequency    Min 2X/week      PT Plan      Co-evaluation              AM-PAC PT 6 Clicks Mobility    Outcome Measure  Help needed turning from your back to your side while in a flat bed without using bedrails?: None Help needed moving from lying on your back to sitting on the side of a flat bed without using bedrails?: A Little Help needed moving to and from a bed to a chair (including a wheelchair)?: A Little Help needed standing up from a chair using your arms (e.g., wheelchair or bedside chair)?: A Little Help needed to walk in hospital room?: A Little Help needed climbing 3-5 steps with a railing? : A Lot 6 Click Score: 18    End of Session Equipment Utilized During Treatment: Gait belt Activity Tolerance: Patient tolerated treatment well Patient left: in chair;with call bell/phone within reach;with chair alarm set Nurse Communication: Mobility status PT Visit Diagnosis: Difficulty in walking, not elsewhere classified (R26.2);Muscle weakness (generalized) (M62.81);Pain     Time: 8872-8852 PT Time Calculation (min) (ACUTE ONLY): 20 min  Charges:    $Gait Training: 8-22 mins PT General Charges $$ ACUTE PT VISIT: 1 Visit                     Darice Potters PT Acute Rehabilitation Services Office [phone]    Potters Darice Norris 04/03/2024, 12:06 PM

## 2024-04-03 NOTE — Telephone Encounter (Signed)
 Called patient's daughter with an update on patient's status. No needs voiced at this time.

## 2024-04-03 NOTE — Consult Note (Signed)
 WOC Nurse ostomy follow up Stoma type/location: LUQ; colostomy  Stomal assessment/size: oval shaped, yellow slough at mucocutaneous junction  Tunneling at 3 o'clock 1.5cm  Peristomal assessment: see above; picture in chart Treatment options for stomal/peristomal skin: silver  hydrofiber packed into tunneled area, stoma powder applied to slough and full thickness open wound proximal to stoma.  Output soft yellow/brown Ostomy pouching: 1pc. Soft convex; with silver  to open wound and 2 barrier ring Education provided: NA Enrolled patient in [employer] Discharge program: Yes  WOC Nurse will follow along with you for continued support with ostomy teaching and care Hussain [facility] MSN, RN, [place], CNS, CWON-AP [phone]   .

## 2024-04-03 NOTE — Progress Notes (Signed)
 GYN Oncology Progress Note  33 Days Post-Op Procedure(s) (LRB): LAPAROTOMY, EXPLORATORY (N/A) SIGMOIDOSCOPY COLECTOMY, WITH COLOSTOMY CREATION MOBILIZATION, SPLENIC FLEXURE, WITH PARTIAL COLECTOMY CYSTOSCOPY  Subjective: Patient reports doing well today. She ate almost all of an omelette. She has had meatloaf as well. She continues to drink the supplemental shakes. Has been watching ostomy changes but has not actively participated yet. States she has been having conversations with her daughter and thinks the final decision for placement is at [facility] in [place]. She continues to tolerate the wet to dry dressing changes well.   Objective: Vital signs in last 24 hours: Temp:  [98 F (36.7 C)-98.4 F (36.9 C)] 98.2 F (36.8 C) (08/04 0551) Pulse Rate:  [93-102] 93 (08/04 0551) Resp:  [17-18] 18 (08/04 0551) BP: (122-132)/(68-74) 131/74 (08/04 0551) SpO2:  [97 %-99 %] 99 % (08/04 0551) Last BM Date : 04/02/24  Intake/Output from previous day: 08/03 0701 - 08/04 0700 In: 737 [P.O.:717; I.V.:15] Out: 1205 [Urine:1100; Drains:55; Stool:50]   Physical Examination: General: Alert, oriented, sitting up in the chair, appears comfortable, no acute distress Respiratory: Normal work of breathing. Lungs clear to auscultation bilaterally without rhonchi. Cardiovascular: Regular sinus rhythm and rate, no murmurs or rubs. Abdomen: soft, + BS, less tender. Incision: midline abdominal open incision is covered with dry dressing. Wound superficially visualized with pink, moist wound tissue, no surrounding erythema. Left paracolic gutter drain with purulent tan drainage. Dry dressing around drain. Ostomy bag with soft stool and flatus.    Extremities: extremities warm and well perfused, no edema  Labs: From 03/30/2024  Assessment: 78 y.o. s/p Procedure(s): LAPAROTOMY, EXPLORATORY SIGMOIDOSCOPY COLECTOMY, WITH COLOSTOMY CREATION MOBILIZATION, SPLENIC FLEXURE, WITH PARTIAL  COLECTOMY CYSTOSCOPY: Per patient, decision for Alexandria Durham facility has been decided with her and her daughter. Care team has been notified. Improving physical conditioning.   Post-op: - Continue twice daily wet to dry dressing changes.  - Continue Remeron  - Continue OOB -Current antibiotic regimen: meropenem  (7/17) to continue until 04/20/24. -Will discuss with ID if need for CT scan prior to discharge or to arrange closer to the end of IV antibiotic therapy.   At discharge, patient will need to have PICC line care with flushes per facility protocol, IV antibiotic (merepenem) until 04/20/2024, twice daily dressing changes to the open abdominal incision, ostomy care/management, abscess drain flushes three times daily along with output monitoring and dressing changes.  VTE Prophylaxis:  - SCDs, lovenox    LOS: 34 days    Alexandria JONETTA Epps, NP 04/03/2024, 8:56 AM

## 2024-04-03 NOTE — Plan of Care (Signed)
  Problem: Clinical Measurements: Goal: Ability to maintain clinical measurements within normal limits will improve Outcome: Progressing Goal: Will remain free from infection Outcome: Progressing Goal: Diagnostic test results will improve Outcome: Progressing Goal: Respiratory complications will improve Outcome: Progressing Goal: Cardiovascular complication will be avoided Outcome: Progressing   Problem: Activity: Goal: Risk for activity intolerance will decrease Outcome: Progressing   Problem: Coping: Goal: Level of anxiety will decrease Outcome: Progressing   Problem: Pain Managment: Goal: General experience of comfort will improve and/or be controlled Outcome: Progressing   Problem: Safety: Goal: Ability to remain free from injury will improve Outcome: Progressing   Problem: Skin Integrity: Goal: Risk for impaired skin integrity will decrease Outcome: Progressing   Problem: Education: Goal: Ability to describe self-care measures that may prevent or decrease complications (Diabetes Survival Skills Education) will improve Outcome: Progressing   Problem: Fluid Volume: Goal: Ability to maintain a balanced intake and output will improve Outcome: Progressing   Problem: Metabolic: Goal: Ability to maintain appropriate glucose levels will improve Outcome: Progressing   Problem: Skin Integrity: Goal: Risk for impaired skin integrity will decrease Outcome: Progressing   Problem: Self-Concept: Goal: Communication of feelings regarding changes in body function or appearance will improve Outcome: Progressing   Problem: Activity: Goal: Ability to tolerate increased activity will improve Outcome: Progressing   Problem: Role Relationship: Goal: Method of communication will improve Outcome: Progressing

## 2024-04-03 NOTE — Plan of Care (Signed)
  Problem: Clinical Measurements: Goal: Respiratory complications will improve Outcome: Progressing Goal: Cardiovascular complication will be avoided Outcome: Progressing   Problem: Activity: Goal: Risk for activity intolerance will decrease Outcome: Progressing   Problem: Coping: Goal: Level of anxiety will decrease Outcome: Progressing   

## 2024-04-03 NOTE — TOC Progression Note (Addendum)
 Transition of Care [facility]) - Progression Note    Patient Details  Name: Alexandria Durham MRN: [id number] Date of Birth: 1946/07/23  [facility] CM/SW Contact  [name], RN Phone [phone]  04/03/2024, 8:44 AM  Clinical Narrative:    Cm called daughter [name] to inquire about choice for SNF. [name] is requesting CM to reach back out to [place].  9145 Daughter called CM back to inform that patient wants to speak with CM to make her own decision about choice. CM explained that CM will talk to patient but on previous conversation patient requested that CM speak with daughter about choice.   1056 Patient is now agreeable to go to [facility]. CM has reached out to [facility] admissions coordinator. There is no answer. Voicemail has been left.   1113 CM spoke with [facility] admissions coordinator at [facility]. Facility can accept patient . Insurance auth to be initiated.    Expected Discharge Plan: Home/Self Care Barriers to Discharge: Continued Medical Work up               Expected Discharge Plan and Services In-house Referral: NA Discharge Planning Services: CM Consult Post Acute Care Choice: NA Living arrangements for the past 2 months: Single Family Home                 DME Arranged: N/A DME Agency: NA       HH Arranged: NA HH Agency: NA         Social Drivers of Health (SDOH) Interventions SDOH Screenings   Food Insecurity: No Food Insecurity (02/29/2024)  Housing: High Risk (02/29/2024)  Transportation Needs: No Transportation Needs (02/29/2024)  Utilities: Not At Risk (02/29/2024)  Recent Concern: Utilities - At Risk (02/09/2024)  Social Connections: Patient Declined (02/29/2024)  Tobacco Use: Medium Risk (03/01/2024)    Readmission Risk Interventions    03/02/2024    3:05 PM  Readmission Risk Prevention Plan  Transportation Screening Complete  PCP or Specialist Appt within 3-5 Days Complete  HRI or Home Care Consult Complete  Social  Work Consult for Recovery Care Planning/Counseling Complete  Palliative Care Screening Not Applicable  Medication Review Oceanographer) Complete

## 2024-04-03 NOTE — Plan of Care (Signed)
  Problem: Clinical Measurements: Goal: Ability to maintain clinical measurements within normal limits will improve Outcome: Progressing Goal: Will remain free from infection Outcome: Progressing Goal: Diagnostic test results will improve Outcome: Progressing Goal: Respiratory complications will improve Outcome: Progressing Goal: Cardiovascular complication will be avoided Outcome: Progressing   Problem: Activity: Goal: Risk for activity intolerance will decrease Outcome: Progressing   Problem: Coping: Goal: Level of anxiety will decrease Outcome: Progressing   Problem: Elimination: Goal: Will not experience complications related to bowel motility Outcome: Progressing   Problem: Pain Managment: Goal: General experience of comfort will improve and/or be controlled Outcome: Progressing   Problem: Safety: Goal: Ability to remain free from injury will improve Outcome: Progressing   Problem: Skin Integrity: Goal: Risk for impaired skin integrity will decrease Outcome: Progressing   Problem: Education: Goal: Ability to describe self-care measures that may prevent or decrease complications (Diabetes Survival Skills Education) will improve Outcome: Progressing   Problem: Coping: Goal: Ability to adjust to condition or change in health will improve Outcome: Progressing   Problem: Fluid Volume: Goal: Ability to maintain a balanced intake and output will improve Outcome: Progressing   Problem: Health Behavior/Discharge Planning: Goal: Ability to identify and utilize available resources and services will improve Outcome: Progressing Goal: Ability to manage health-related needs will improve Outcome: Progressing   Problem: Metabolic: Goal: Ability to maintain appropriate glucose levels will improve Outcome: Progressing   Problem: Nutritional: Goal: Maintenance of adequate nutrition will improve Outcome: Progressing Goal: Progress toward achieving an optimal weight will  improve Outcome: Progressing   Problem: Skin Integrity: Goal: Risk for impaired skin integrity will decrease Outcome: Progressing   Problem: Tissue Perfusion: Goal: Adequacy of tissue perfusion will improve Outcome: Progressing   Problem: Education: Goal: Knowledge of the prescribed therapeutic regimen will improve Outcome: Progressing   Problem: Self-Concept: Goal: Communication of feelings regarding changes in body function or appearance will improve Outcome: Progressing   Problem: Skin Integrity: Goal: Demonstration of wound healing without infection will improve Outcome: Progressing   Problem: Activity: Goal: Ability to tolerate increased activity will improve Outcome: Progressing   Problem: Respiratory: Goal: Ability to maintain a clear airway and adequate ventilation will improve Outcome: Progressing   Problem: Role Relationship: Goal: Method of communication will improve Outcome: Progressing

## 2024-04-04 ENCOUNTER — Telehealth: Payer: Self-pay | Admitting: Oncology

## 2024-04-04 MED ORDER — LIDOCAINE 5 % EX PTCH: 1.0000 | MEDICATED_PATCH | CUTANEOUS | 1 refills | Status: DC

## 2024-04-04 MED ORDER — SODIUM CHLORIDE 0.9 % IJ SOLN: 5.0000 mL | Freq: Three times a day (TID) | INTRAMUSCULAR | 60 refills | Status: AC

## 2024-04-04 MED ORDER — MEROPENEM IV (FOR PTA / DISCHARGE USE ONLY)
1.0000 g | Freq: Two times a day (BID) | INTRAVENOUS | 0 refills | Status: DC
Start: 2024-04-04 — End: 2024-04-26

## 2024-04-04 MED ORDER — KATE FARMS STANDARD 1.4 PO LIQD: 325.0000 mL | Freq: Three times a day (TID) | ORAL | 3 refills | Status: AC

## 2024-04-04 MED ORDER — OXYCODONE HCL 10 MG PO TABS: 5.0000 mg | ORAL_TABLET | Freq: Four times a day (QID) | ORAL | 0 refills | Status: AC | PRN

## 2024-04-04 MED ORDER — MIRTAZAPINE 7.5 MG PO TABS: 7.5000 mg | ORAL_TABLET | Freq: Every day | ORAL | 3 refills | Status: AC

## 2024-04-04 MED ORDER — POLYETHYLENE GLYCOL 3350 17 G PO PACK: 17.0000 g | PACK | Freq: Every day | ORAL | 0 refills | Status: AC

## 2024-04-04 NOTE — TOC Transition Note (Signed)
 Transition of Care [facility]) - Discharge Note   Patient Details  Name: Alexandria Durham MRN: [id number] Date of Birth: 08/06/1946  Transition of Care [facility]) CM/SW Contact:  [name], RN Phone Number:[phone]  04/04/2024, 3:15 PM   Clinical Narrative:    Patient discharging to [name]. Transportation has been arranged per PT[facility]. Daughter has been updated. Discharge packet is at nurses station. Nurse updated.  Please call report to [name]  [phone]   Final next level of care: Skilled Nursing Facility Barriers to Discharge: No Barriers Identified   Patient Goals and CMS Choice Patient states their goals for this hospitalization and ongoing recovery are:: To go to short term rehab and then home. CMS Medicare.gov Compare Post Acute Care list provided to:: Other (Comment Required) (NA) Choice offered to / list presented to : Patient [name] ownership interest in [facility].provided to:: Patient    Discharge Placement              [facility] at[name] Patient to be transferred to facility by: PTAR Name of family member notified: [name] Patient and family notified of of transfer: 04/04/24  Discharge Plan and Services Additional resources added to the After Visit Summary for   In-house Referral: NA Discharge Planning Services: CM Consult Post Acute Care Choice: NA          DME Arranged: N/A DME Agency: NA       HH Arranged: NA HH Agency: NA        Social Drivers of Health (SDOH) Interventions SDOH Screenings   Food Insecurity: No Food Insecurity (02/29/2024)  Housing: High Risk (02/29/2024)  Transportation Needs: No Transportation Needs (02/29/2024)  Utilities: Not At Risk (02/29/2024)  Recent Concern: Utilities - At Risk (02/09/2024)  Social Connections: Patient Declined (02/29/2024)  Tobacco Use: Medium Risk (03/01/2024)     Readmission Risk Interventions    03/02/2024    3:05 PM  Readmission Risk Prevention Plan   Transportation Screening Complete  PCP or Specialist Appt within 3-5 Days Complete  HRI or Home Care Consult Complete  Social Work Consult for Recovery Care Planning/Counseling Complete  Palliative Care Screening Not Applicable  Medication Review Oceanographer) Complete

## 2024-04-04 NOTE — TOC Progression Note (Addendum)
 Transition of Care [facility]) - Progression Note    Patient Details  Name: Alexandria [facility] MRN: [id number] Date of Birth: 1946-03-25  Transition of Care [facility] CM/SW Contact  [name], RN Phone Number:[phone]  04/04/2024, 8:42 AM  Clinical Narrative:    [name] has received insurance auth and can accept patient today. NP has been notified. Will plan for discharge today.     Expected Discharge Plan: Home/Self Care Barriers to Discharge: Continued Medical Work up               Expected Discharge Plan and Services In-house Referral: NA Discharge Planning Services: CM Consult Post Acute Care Choice: NA Living arrangements for the past 2 months: Single Family Home                 DME Arranged: N/A DME Agency: NA       HH Arranged: NA HH Agency: NA         Social Drivers of Health (SDOH) Interventions SDOH Screenings   Food Insecurity: No Food Insecurity (02/29/2024)  Housing: High Risk (02/29/2024)  Transportation Needs: No Transportation Needs (02/29/2024)  Utilities: Not At Risk (02/29/2024)  Recent Concern: Utilities - At Risk (02/09/2024)  Social Connections: Patient Declined (02/29/2024)  Tobacco Use: Medium Risk (03/01/2024)    Readmission Risk Interventions    03/02/2024    3:05 PM  Readmission Risk Prevention Plan  Transportation Screening Complete  PCP or Specialist Appt within 3-5 Days Complete  HRI or Home Care Consult Complete  Social Work Consult for Recovery Care Planning/Counseling Complete  Palliative Care Screening Not Applicable  Medication Review Oceanographer) Complete

## 2024-04-04 NOTE — Telephone Encounter (Signed)
 Called [name] (daughter) and let her know that Kla is going to be discharged to [name] this afternoon.  Advised that she will be scheduled for a rescan and drain check with IR the week of August 18.  Discussed that since it is still draining a moderate amount, Dr. [name] and IR stated that we could wait on the rescan until closer to the date when the antibiotics are finished.  [name] verbalized agreement and asked about arranging transportation for [facility] from [name] for the IR appointment.  Advised I will call [name] and arrange transportation once the appointment is scheduled.  She also asked to be called when Azuri has left the hospital so that she can meet her at [facility].

## 2024-04-04 NOTE — Discharge Summary (Addendum)
 Physician Discharge Summary  Patient ID: Alexandria Durham MRN: [id number] DOB/AGE: 02/03/1946 78 y.o.  Admit date: 02/29/2024 Discharge date: 04/04/2024  Admission Diagnoses: Postmenopausal bleeding, thickened endometrium  Discharge Diagnoses:  Principal Problem:   Postmenopausal bleeding Active Problems:   Protein-calorie malnutrition, severe Bowel perforation Pelvic abscess Ostomy creation  Discharged Condition:  The patient is in good condition and stable for discharge.    Hospital Course: Alexandria Durham is a 78 year female who underwent hysteroscopy with myosure under US  guidance with diagnostic laparoscopy on 02/29/2024 for thickened endometrium. Diagnostic laparoscopy was performed given concern for uterine perforation, but no bowel injury was identified at that time. Patient taken back to the OR on 03/01/24 after developing signs of sepsis,shock (hypotension, tachycardia, peritonitis symptoms) in the setting of concern for bowel injury from the prior procedure with evidence of bowel perforation. At that time, she underwent exploratory laparotomy, partial colectomy, descending end colostomy, mobilization of splenic flexure, cystoscopy, endometrial biopsy (Flexible Sigmoidoscopy performed by Dr. Debby).  She was started on zosyn  on 03/01/2024. She was also admitted to ICU post-op on ventilation. She was ultimately extubated and transferred to the medical floor. Due to poor oral intake, she was started on TPN on 03/06/24 through 03/20/2024.   During her hospital course, she had wound VAC therapy to an open abdominal wound that has been transitioned to twice daily wet to dry dressing changes. To assist with guiding antibiotic therapy and to evaluate purulent wound drainage, she underwent a CT AP on 03/22/24 revealing a pelvic abscess. She underwent IR drainage with drain placement on 03/23/2024. Culture returned with E coli and pseudomonas aeruginosa. She was started on meropenem  IV with the recommendation  from ID to continue this for 1 month after drain placement (completion date of 04/20/24).   At discharge, the patient was ambulating with assist, up to chair with assist, increasing her oral intake, voiding, having ostomy output, JP with continued purulent output with plan for repeat imaging with IR around 04/17/24 prior to completion of antibiotics on 04/20/2024, pain controlled with oral medications, labs stable.  Discharge recommendations include: -Continued physical therapy to help increase your strength -Continued IV antibiotic (meropenem ) through the PICC line until 04/20/2024 -PICC line care, flushes, and dressing changes per facility policy -Twice daily wet to dry dressing changes to the mid abdominal open incision with sterile saline -Continued 5 cc sterile saline JP drain flushes three times daily with daily dressing changes and close monitoring of output. Please have output available for review at IR appointment. -Ostomy care and management and education  Plan will be for follow up with Interventional Radiology as an outpatient around 04/17/24 for repeat imaging, drain study prior to completion of IV antibiotic on 04/20/2024.  Outpatient follow up has been made with Dr. [name] for 04/24/2024.  Consults: Wound/ostomy, pharmacy, PT, ID, IR, Gen Surg  Significant Diagnostic Studies: Labs, CT scans, cultures from wound and abscess collection  Treatments: surgery: see above, IV antibiotics, IV pain medications, IV hydration, TPN, negative pressure wound therapy.  Discharge Exam: Blood pressure 119/79, pulse 91, temperature 97.6 F (36.4 C), temperature source Oral, resp. rate 18, height 5' 4 (1.626 m), weight 155 lb 10.3 oz (70.6 kg), SpO2 98%. General: Alert, oriented, resting in bed, appears comfortable, no acute distress Respiratory: Normal work of breathing. Lungs clear to auscultation bilaterally without rhonchi. Cardiovascular: Regular sinus rhythm and rate, no murmurs or  rubs. Abdomen: soft, + BS, less tender. Incision: midline abdominal open incision is covered with dry  dressing. Left paracolic gutter drain with minimal purulent tan drainage. Dry dressing around drain. Ostomy bag with soft stool and flatus.    Extremities: extremities warm and well perfused, no edema  Disposition:  Discharge disposition: 03-Skilled Nursing Facility       Discharge Instructions     [facility] pharmacist to adjust dose for Vancomycin, Aminoglycosides and other anti-infective therapies as requested by physician.   Complete by: As directed    [facility] to provide Cath Flo 2mg    Complete by: As directed    Administer for PICC line occlusion and as ordered by physician for other access device issues.   Anaphylaxis Kit: Provided to treat any anaphylactic reaction to the medication being provided to the patient if First Dose or when requested by physician   Complete by: As directed    Epinephrine  1mg /ml vial / amp: Administer 0.3mg  (0.15ml) subcutaneously once for moderate to severe anaphylaxis, nurse to call physician and pharmacy when reaction occurs and call 911 if needed for immediate care   Diphenhydramine  50mg /ml IV vial: Administer 25-50mg  IV/IM PRN for first dose reaction, rash, itching, mild reaction, nurse to call physician and pharmacy when reaction occurs   Sodium Chloride  0.9% NS 500ml IV: Administer if needed for hypovolemic blood pressure drop or as ordered by physician after call to physician with anaphylactic reaction   Call MD for:  difficulty breathing, headache or visual disturbances   Complete by: As directed    Call MD for:  extreme fatigue   Complete by: As directed    Call MD for:  hives   Complete by: As directed    Call MD for:  persistant dizziness or light-headedness   Complete by: As directed    Call MD for:  persistant nausea and vomiting   Complete by: As directed    Call MD for:  redness, tenderness, or signs of  infection (pain, swelling, redness, odor or green/yellow discharge around incision site)   Complete by: As directed    Call MD for:  severe uncontrolled pain   Complete by: As directed    Call MD for:  temperature >100.4   Complete by: As directed    Change dressing on IV access line weekly and PRN   Complete by: As directed    Diet - low sodium heart healthy   Complete by: As directed    Discharge wound care:   Complete by: As directed    Midline abdominal open incision to have twice daily wet to dry dressing changes. Change dressing around JP drain every 1-2 days or if soiled.   Driving Restrictions   Complete by: As directed    Do not take narcotics and drive. You need to make sure your reaction time has returned.   Flush IV access with Sodium Chloride  0.9% and Heparin  10 units/ml or 100 units/ml   Complete by: As directed    Home infusion instructions - [facility]   Complete by: As directed    Instructions: Flush IV access with Sodium Chloride  0.9% and Heparin  10units/ml or 100units/ml   Change dressing on IV access line: Weekly and PRN   Instructions Cath Flo 2mg : Administer for PICC Line occlusion and as ordered by physician for other access device   [facility] pharmacist to adjust dose for: Vancomycin, Aminoglycosides and other anti-infective therapies as requested by physician   Increase activity slowly   Complete by: As directed    Lifting restrictions   Complete  by: As directed    No lifting greater than 10 lbs, pushing, pulling, straining for 6 weeks after surgery   Method of administration may be changed at the discretion of home infusion pharmacist based upon assessment of the patient and/or caregiver's ability to self-administer the medication ordered   Complete by: As directed    Sexual Activity Restrictions   Complete by: As directed    No sexual activity, nothing in the vagina, for 6 weeks after surgery.      Allergies as of 04/04/2024        Reactions   Ace Inhibitors Hives, Itching   Tape Other (See Comments)   Skin is sensitive   Aspirin Palpitations   Codeine Palpitations        Medication List     STOP taking these medications    polycarbophil 625 MG tablet Commonly known as: FIBERCON   polyethylene glycol powder 17 GM/SCOOP powder Commonly known as: GLYCOLAX /MIRALAX  Replaced by: polyethylene glycol 17 g packet       TAKE these medications    bisacodyl  5 MG EC tablet Generic drug: bisacodyl  Take 5 mg by mouth daily as needed for moderate constipation.   buPROPion  150 MG 24 hr tablet Commonly known as: WELLBUTRIN  XL Take 150 mg by mouth every evening.   diphenhydramine -acetaminophen  25-500 MG Tabs tablet Commonly known as: TYLENOL  PM Take 2 tablets by mouth at bedtime as needed (Sleep/Pain).   ezetimibe  10 MG tablet Commonly known as: ZETIA  Take 10 mg by mouth daily.   feeding supplement (KATE FARMS STANDARD 1.4) Liqd liquid Take 325 mLs by mouth 3 (three) times daily between meals.   ferrous sulfate 325 (65 FE) MG tablet Take 325 mg by mouth daily with breakfast.   fluticasone  50 MCG/ACT nasal spray Commonly known as: FLONASE  Place 2 sprays into both nostrils daily as needed for rhinitis.   hydrochlorothiazide  25 MG tablet Commonly known as: HYDRODIURIL  Take 1 tablet (25 mg total) by mouth daily.   Januvia 50 MG tablet Generic drug: sitaGLIPtin Take 50 mg by mouth daily.   levothyroxine  75 MCG tablet Commonly known as: SYNTHROID  Take 75 mcg by mouth daily before breakfast.   lidocaine  5 % Commonly known as: LIDODERM  Place 1 patch onto the skin daily. Remove & Discard patch within 12 hours or as directed by MD. Apply to the right mid abdomen.   losartan  50 MG tablet Commonly known as: COZAAR  Take 50 mg by mouth in the morning.   magnesium  oxide 400 MG tablet Commonly known as: MAG-OX Take 400 mg by mouth daily.   meropenem  IVPB Commonly known as: MERREM  Inject 1 g into  the vein every 12 (twelve) hours for 22 days. Indication:  Polymicrobial intra-abdominal infection First Dose: Yes Last Day of Therapy:  04/20/24 Labs - Once weekly:  CBC/D and BMP, Labs - Once weekly: ESR and CRP Method of administration: Mini-Bag Plus / Gravity Method of administration may be changed at the discretion of home infusion pharmacist based upon assessment of the patient and/or caregiver's ability to self-administer the medication ordered.   methocarbamol  500 MG tablet Commonly known as: ROBAXIN  Take 1 tablet (500 mg total) by mouth 2 (two) times daily as needed for muscle spasms.   mirtazapine  7.5 MG tablet Commonly known as: REMERON  Take 1 tablet (7.5 mg total) by mouth at bedtime.   mometasone 0.1 % cream Commonly known as: ELOCON Apply 1 Application topically daily as needed (Dermatitis- affected sites).   omeprazole 40 MG capsule Commonly  known as: PRILOSEC Take 40 mg by mouth daily before breakfast.   ondansetron  4 MG tablet Commonly known as: ZOFRAN  Take 1 tablet (4 mg total) by mouth every 6 (six) hours as needed for nausea.   Oxycodone  HCl 10 MG Tabs Take 0.5-1 tablets (5-10 mg total) by mouth every 6 (six) hours as needed for moderate pain (pain score 4-6) (5 mg for moderate pain, 10 mg for severe pain per Dr. [name]).   polyethylene glycol 17 g packet Commonly known as: MIRALAX  / GLYCOLAX  Take 17 g by mouth daily. Do not give if ostomy output loose Start taking on: April 05, 2024 Replaces: polyethylene glycol powder 17 GM/SCOOP powder   sodium chloride  0.9 % injection 5 mLs by Other route 3 (three) times daily. Flush JP drain (abscess drain) with 5 cc sterile saline three times daily   triamcinolone ointment 0.5 % Commonly known as: KENALOG Apply 1 Application topically 2 (two) times daily as needed (for itching).   Tylenol  8 Hour Arthritis Pain 650 MG CR tablet Generic drug: acetaminophen  Take 650-1,300 mg by mouth every 8 (eight) hours as needed  for pain.   VITAMIN B-12 PO Take 1 tablet by mouth in the morning.   Vitamin D3 25 MCG (1000 UT) Caps Take 1,000 Units by mouth daily.               Discharge Care Instructions  (From admission, onward)           Start     Ordered   04/04/24 0000  Change dressing on IV access line weekly and PRN  (Home infusion instructions - [facility] )        04/04/24 1342   04/04/24 0000  Discharge wound care:       Comments: Midline abdominal open incision to have twice daily wet to dry dressing changes. Change dressing around JP drain every 1-2 days or if soiled.   04/04/24 1342            Follow-up Information     [name] Mays, MD Follow up on 04/24/2024.   Specialty: Gynecologic Oncology Why: at pm at the [facility]. We will work on arranging for a CT scan shortly before this appointment with the Interventional Radiology Department. You should receive a call about this. Contact information: [address] [place] [address] [phone]                 Greater than thirty minutes were spend for face to face discharge instructions and discharge orders/summary in EPIC.   Signed: Yehudis Monceaux D Caleesi Kohl 04/04/2024, 2:02 PM

## 2024-04-04 NOTE — Progress Notes (Signed)
 This nurse called [name] and gave report to Penne, RN on the patient.

## 2024-04-05 ENCOUNTER — Other Ambulatory Visit: Payer: Self-pay | Admitting: Internal Medicine

## 2024-04-05 DIAGNOSIS — K651 Peritoneal abscess: Secondary | ICD-10-CM

## 2024-04-06 ENCOUNTER — Telehealth: Payer: Self-pay | Admitting: Oncology

## 2024-04-06 NOTE — Telephone Encounter (Signed)
 [name] called back and confirmed the appointment.  Also called [name] and informed them of upcoming appointments so that transportation can be arranged.  Faxed appointment list to them at [phone].

## 2024-04-06 NOTE — Telephone Encounter (Signed)
 Left a message for [facility] with appointment with ID - Dr. [name] on 04/26/2024 at 2:00.

## 2024-04-13 ENCOUNTER — Telehealth: Payer: Self-pay | Admitting: Surgery

## 2024-04-13 NOTE — Telephone Encounter (Signed)
 Called [facility] to check on status of patient. Per nurse, patient adjusting well to ostomy. Still having some pain but nothing new or worse. Drainage from JP was 20ml yesterday on day shift. Denies fevers or any other concerns at this time. Requested that nurse call our office back if there are any changes to patient status or concerns. Our office number given. [phone].

## 2024-04-19 ENCOUNTER — Ambulatory Visit
Admission: RE | Admit: 2024-04-19 | Discharge: 2024-04-19 | Disposition: A | Discharge status: Home / Self Care | Source: Ambulatory Visit | Attending: Internal Medicine | Admitting: Internal Medicine

## 2024-04-19 ENCOUNTER — Ambulatory Visit
Admission: RE | Admit: 2024-04-19 | Discharge: 2024-04-19 | Disposition: A | Discharge status: Home / Self Care | Source: Ambulatory Visit | Attending: Internal Medicine

## 2024-04-19 ENCOUNTER — Ambulatory Visit
Admission: RE | Admit: 2024-04-19 | Discharge: 2024-04-19 | Disposition: A | Discharge status: Home / Self Care | Source: Ambulatory Visit | Attending: Radiology | Admitting: Radiology

## 2024-04-19 DIAGNOSIS — T8143XA Infection following a procedure, organ and space surgical site, initial encounter: Secondary | ICD-10-CM

## 2024-04-19 HISTORY — PX: IR RADIOLOGIST EVAL & MGMT: IMG5224

## 2024-04-19 MED ORDER — IOPAMIDOL (ISOVUE-300) INJECTION 61%
30.0000 mL | Freq: Once | INTRAVENOUS | Status: AC | PRN
Start: 2024-04-19 — End: 2024-04-19
  Administered 2024-04-19: 15 mL

## 2024-04-19 MED ORDER — IOPAMIDOL (ISOVUE-300) INJECTION 61%
100.0000 mL | Freq: Once | INTRAVENOUS | Status: AC | PRN
Start: 2024-04-19 — End: 2024-04-19
  Administered 2024-04-19: 100 mL via INTRAVENOUS

## 2024-04-19 NOTE — Progress Notes (Addendum)
 Patient ID: Alexandria Durham, female   DOB: May 23, 1946, 78 y.o.   MRN: [id number]   Referring Physician(s): Allred,Darrell K  Chief Complaint: The patient is seen in follow up today s/p LLQ drain placement on 03/23/24 for post operative abscess  History of present illness:  Patient is a 78 year old female who was previously admitted on 02/29/2024 for hysteroscopy and D&C.  The procedure was complicated with perforation of the uterus and injury to the sigmoid colon with resulting septic shock.  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  A following CT scan on 03/22/2024 showed a rim-enhancing fluid and gas collection in the left paracolic gutter, 8.0 x 2.2 x 8.3 cm.  IR was consulted for drain placement for this fluid collection, with placement on 03/23/2024.  Patient was then discharged 04/04/2024 to rehab facility.  Presenting today to IR outpatient clinic for follow-up after initial drain placement.  Today she is reporting persistent output from the drain, roughly 1 full bulb per day of tan-colored output.  She is having moderate tenderness around the left lower quadrant and drain insertion site.  Denies any fevers or chills.  Past Medical History:  Diagnosis Date   Arthritis    Chronic kidney disease    CKD 3a   COPD (chronic obstructive pulmonary disease) (HCC)    Diabetes mellitus    Family history of adverse reaction to anesthesia    sister woke up during her eye surgery.   GERD (gastroesophageal reflux disease)    Heart murmur    Hypercholesteremia    Hypertension    Hypothyroidism     Past Surgical History:  Procedure Laterality Date   BREAST CYST EXCISION Left    benign cyst   CHOLECYSTECTOMY N/A 01/17/2024   Procedure: LAPAROSCOPIC CHOLECYSTECTOMY WITH INTRAOPERATIVE CHOLANGIOGRAM;  Surgeon: Sheldon Standing, MD;  Location: [facility];  Service: General;  Laterality: N/A;   COLECTOMY WITH COLOSTOMY CREATION/HARTMANN PROCEDURE   03/01/2024   Procedure: COLECTOMY, WITH COLOSTOMY CREATION;  Surgeon: [name] Mays, MD;  Location: [facility];  Service: Gynecology;;   [name]  03/01/2024   Procedure: CYSTOSCOPY;  Surgeon: [name] Mays, MD;  Location: [facility];  Service: Gynecology;;   DILATION AND CURETTAGE OF UTERUS N/A 01/17/2024   Procedure: DILATION AND CURETTAGE;  Surgeon: [name] Mays, MD;  Location: [facility];  Service: Gynecology;  Laterality: N/A;   EYE SURGERY Left    cataract removal   HEMORRHOID SURGERY     HIP SURGERY     HYSTEROSCOPY WITH D & C N/A 01/06/2024   Procedure: DILATATION AND CURETTAGE /HYSTEROSCOPY;  Surgeon: Cleatus Moccasin, MD;  Location: [facility];  Service: Gynecology;  Laterality: N/A;  PAP SMEAR (DR TO BRING SUPPLIES)  TUCKERS CARD   HYSTEROSCOPY WITH D & C N/A 02/29/2024   Procedure: HYSTEROSCOPY WITH MYOSURE;  Surgeon: [name] Mays, MD;  Location: [facility];  Service: Gynecology;  Laterality: N/A;  myosure, possible ultrasound guidance   JOINT REPLACEMENT     hip replacement   LAPAROSCOPIC LYSIS OF ADHESIONS  01/06/2024   Procedure: LYSIS, ADHESIONS, LAPAROSCOPIC;  Surgeon: Cleatus Moccasin, MD;  Location: [facility];  Service: Gynecology;;   LAPAROSCOPY N/A 01/06/2024   Procedure: LAPAROSCOPY, DIAGNOSTIC;  Surgeon: Cleatus Moccasin, MD;  Location: [facility];  Service: Gynecology;  Laterality: N/A;   LAPAROSCOPY N/A 02/29/2024   Procedure: LAPAROSCOPY, DIAGNOSTIC;  Surgeon: [name] Mays, MD;  Location: [facility];  Service: Gynecology;  Laterality: N/A;   LAPAROTOMY N/A 03/01/2024   Procedure:  LAPAROTOMY, EXPLORATORY;  Surgeon: [name] Mays, MD;  Location: [facility];  Service: Gynecology;  Laterality: N/A;  Possible Ileostomy   MOBILIZATION, SPLENIC FLEXURE, WITH PARTIAL COLECTOMY  03/01/2024   Procedure: MOBILIZATION, SPLENIC FLEXURE, WITH PARTIAL COLECTOMY;  Surgeon: [name] Mays, MD;  Location: [facility];  Service: Gynecology;;   OPERATIVE ULTRASOUND N/A 02/29/2024   Procedure: US  INTRAOPERATIVE;   Surgeon: [name] Mays, MD;  Location: [facility];  Service: Gynecology;  Laterality: N/A;   SIGMOIDOSCOPY N/A 02/29/2024   Procedure: SIGMOIDOSCOPY;  Surgeon: Debby Hila, MD;  Location: [facility];  Service: General;  Laterality: N/A;  Rigid sigmoidoscopy   SIGMOIDOSCOPY  03/01/2024   Procedure: KINGSTON;  Surgeon: Debby Hila, MD;  Location: [facility];  Service: General;;   THYROID  SURGERY     TONSILLECTOMY     TUBAL LIGATION      Allergies: Ace inhibitors, Tape, Aspirin, and Codeine  Medications: Prior to Admission medications   Medication Sig Start Date End Date Taking? Authorizing Provider  bisacodyl  5 MG EC tablet Take 5 mg by mouth daily as needed for moderate constipation.    [provider]  buPROPion  (WELLBUTRIN  XL) 150 MG 24 hr tablet Take 150 mg by mouth every evening.    [provider]  Cholecalciferol  (VITAMIN D3) 25 MCG (1000 UT) CAPS Take 1,000 Units by mouth daily.    [provider]  Cyanocobalamin  (VITAMIN B-12 PO) Take 1 tablet by mouth in the morning.    [provider]  diphenhydramine -acetaminophen  (TYLENOL  PM) 25-500 MG TABS tablet Take 2 tablets by mouth at bedtime as needed (Sleep/Pain).    [provider]  ezetimibe  (ZETIA ) 10 MG tablet Take 10 mg by mouth daily. 10/29/23 10/28/24  [provider]  ferrous sulfate 325 (65 FE) MG tablet Take 325 mg by mouth daily with breakfast.    [provider]  fluticasone  (FLONASE ) 50 MCG/ACT nasal spray Place 2 sprays into both nostrils daily as needed for rhinitis. 02/15/24   Cheryle Page, MD  hydrochlorothiazide  (HYDRODIURIL ) 25 MG tablet Take 1 tablet (25 mg total) by mouth daily. 01/10/24   Barbarann Nest, MD  levothyroxine  (SYNTHROID ) 75 MCG tablet Take 75 mcg by mouth daily before breakfast. 09/22/22   [provider]  lidocaine  (LIDODERM ) 5 % Place 1 patch onto the skin daily. Remove & Discard patch within 12 hours or as directed by MD. Apply to the  right mid abdomen. 04/04/24   Cross, Melissa D, NP  losartan  (COZAAR ) 50 MG tablet Take 50 mg by mouth in the morning.    [provider]  magnesium  oxide (MAG-OX) 400 MG tablet Take 400 mg by mouth daily. 03/01/17   [provider]  meropenem  (MERREM ) IVPB Inject 1 g into the vein every 12 (twelve) hours for 22 days. Indication:  Polymicrobial intra-abdominal infection First Dose: Yes Last Day of Therapy:  04/20/24 Labs - Once weekly:  CBC/D and BMP, Labs - Once weekly: ESR and CRP Method of administration: Mini-Bag Plus / Gravity Method of administration may be changed at the discretion of home infusion pharmacist based upon assessment of the patient and/or caregiver's ability to self-administer the medication ordered. 04/04/24 04/26/24  Cross, Melissa D, NP  methocarbamol  (ROBAXIN ) 500 MG tablet Take 1 tablet (500 mg total) by mouth 2 (two) times daily as needed for muscle spasms. 05/25/21   Joldersma, Logan, PA-C  mirtazapine  (REMERON ) 7.5 MG tablet Take 1 tablet (7.5 mg total) by mouth at bedtime. 04/04/24   Cross, Melissa D, NP  mometasone (ELOCON) 0.1 % cream Apply 1 Application topically daily as needed (Dermatitis- affected sites). 10/27/23   [provider]  Nutritional Supplements (FEEDING SUPPLEMENT, KATE FARMS STANDARD 1.4,) LIQD liquid Take 325 mLs by mouth 3 (three) times daily between meals. 04/04/24   Cross, Melissa D, NP  omeprazole (PRILOSEC) 40 MG capsule Take 40 mg by mouth daily before breakfast.    [provider]  ondansetron  (ZOFRAN ) 4 MG tablet Take 1 tablet (4 mg total) by mouth every 6 (six) hours as needed for nausea. 02/15/24   Cheryle Page, MD  oxyCODONE  10 MG TABS Take 0.5-1 tablets (5-10 mg total) by mouth every 6 (six) hours as needed for moderate pain (pain score 4-6) (5 mg for moderate pain, 10 mg for severe pain per Dr. [name]). 04/04/24   Cross, Melissa D, NP  polyethylene glycol (MIRALAX  / GLYCOLAX ) 17 g packet Take 17 g by mouth daily. Do  not give if ostomy output loose 04/05/24   Cross, Melissa D, NP  sitaGLIPtin (JANUVIA) 50 MG tablet Take 50 mg by mouth daily. 10/28/22   [provider]  sodium chloride  0.9 % injection 5 mLs by Other route 3 (three) times daily. Flush JP drain (abscess drain) with 5 cc sterile saline three times daily 04/04/24   Cross, Melissa D, NP  triamcinolone ointment (KENALOG) 0.5 % Apply 1 Application topically 2 (two) times daily as needed (for itching).    [provider]  TYLENOL  8 HOUR ARTHRITIS PAIN 650 MG CR tablet Take 650-1,300 mg by mouth every 8 (eight) hours as needed for pain.    [provider]     Family History  Problem Relation Age of Onset   Breast cancer Neg Hx    Prostate cancer Neg Hx    Pancreatic cancer Neg Hx    Colon cancer Neg Hx    Endometrial cancer Neg Hx    Ovarian cancer Neg Hx     Social History   Socioeconomic History   Marital status: Single    Spouse name: Not on file   Number of children: Not on file   Years of education: Not on file   Highest education level: Not on file  Occupational History   Not on file  Tobacco Use   Smoking status: Former    Types: Cigarettes    Start date: 1967    Passive exposure: Never   Smokeless tobacco: Never  Vaping Use   Vaping status: Never Used  Substance and Sexual Activity   Alcohol use: No   Drug use: No   Sexual activity: Not Currently  Other Topics Concern   Not on file  Social History Narrative   Not on file   Social Drivers of Health   Financial Resource Strain: Not on file  Food Insecurity: No Food Insecurity (02/29/2024)   Hunger Vital Sign    Worried About Running Out of Food in the Last Year: Never true    Ran Out of Food in the Last Year: Never true  Transportation Needs: No Transportation Needs (02/29/2024)   PRAPARE - Administrator, Civil Service (Medical): No    Lack of Transportation (Non-Medical): No  Physical Activity: Not on file  Stress: Not on file   Social Connections: Patient Declined (02/29/2024)   Social Connection and Isolation Panel    Frequency of Communication with Friends and Family: Patient declined    Frequency of Social Gatherings with Friends and Family: Patient declined  Attends Religious Services: Patient declined    Active Member of Clubs or Organizations: Patient declined    Attends Banker Meetings: Patient declined    Marital Status: Patient declined     Vital Signs: There were no vitals taken for this visit.  Physical Exam Vitals and nursing note reviewed.  Constitutional:      Appearance: Normal appearance.  Abdominal:     Palpations: Abdomen is soft.     Comments: + LLQ drain. + ttp in the LLQ and around drain insertion site. No overlying abnormality. Suture intact and dressed appropriately. ~58ml of tan fecal-like output in bulb.   Skin:    General: Skin is warm and dry.  Neurological:     Mental Status: She is alert and oriented to person, place, and time. Mental status is at baseline.     Imaging: No results found.  Labs:  CBC: Recent Labs    03/22/24 0812 03/24/24 0540 03/26/24 1313 03/30/24 0438  WBC 10.1 10.4 7.4 6.7  HGB 8.7* 8.9* 8.7* 7.9*  HCT 27.5* 27.1* 27.1* 24.9*  PLT 584* 605* 601* 452*    COAGS: No results for input(s): INR, APTT in the last 8760 hours.  BMP: Recent Labs    03/24/24 0540 03/25/24 1221 03/26/24 1313 03/30/24 0438  NA 130* 132* 132* 136  K 4.2 4.2 4.3 4.2  CL 96* 98 97* 101  CO2 25 24 25 24   GLUCOSE 96 174* 113* 109*  BUN 16 17 15 14   CALCIUM  9.1 8.9 8.9 9.4  CREATININE 0.92 0.97 1.09* 0.57  GFRNONAA >60 >60 53* >60    LIVER FUNCTION TESTS: Recent Labs    03/17/24 0711 03/20/24 0525 03/26/24 1313 03/30/24 0438  BILITOT 0.5 0.6 0.3 0.4  AST 59* 44* 31 25  ALT 85* 68* 41 30  ALKPHOS 129* 100 88 67  PROT 6.4* 6.0* 7.0 6.7  ALBUMIN  1.8* 1.7* 2.1* 2.2*    Assessment:  Follow-up for left lower quadrant drain  initially placed 03/23/2024 for postoperative left paracolic gutter abscess. - Patient had repeat CT abdomen pelvis today, reviewed by Dr. Jenna.  Showed small persistent fluid collection in the left paracolic gutter. - Drain was injected under fluoroscopy today in clinic.  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 - Patient and daughter at clinic updated regarding plan to stop all flushing.  Dr. Jenna recommended keeping to bulb for the next 2 to 3 weeks until next follow-up and we will reevaluate whether changing to bag at that time.  These recommendations were all written down on patient's rehab facility paperwork as well so they are aware. - Follow-up order placed for seeing patient back in clinic for repeat drain evaluation and injection in 2 to 3 weeks.  Signed: Kimble VEAR Clas, PA-C 04/19/2024, 10:55 AM   Please refer to Dr. Jenna attestation of this note for management and plan.

## 2024-04-20 ENCOUNTER — Other Ambulatory Visit: Payer: Self-pay | Admitting: Internal Medicine

## 2024-04-20 DIAGNOSIS — T8143XA Infection following a procedure, organ and space surgical site, initial encounter: Secondary | ICD-10-CM

## 2024-04-23 NOTE — Progress Notes (Unsigned)
 Gynecologic Oncology Return Clinic Visit  Date of Service: 04/24/2024 Referring Provider: Vina Solian, MD   Assessment & Plan: Alexandria Durham is a 78 y.o. woman with thickened endometrium, postmenopausal bleeding with hysteroscopy D&C on 02/29/24 complicated bowel perforation, returned to OR on 03/01/24 for xlap, partial colectomy, descending end colostomy. Postop course complicated by abscess with multi-drug resistant organisms.   Postop abscess: - Sinogram and VIR follow-up on 8/20. 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 Recommended continue drain for 2-3 weeks.  - CT a/p on 04/19/24 with resolution of LLQ abscess, two new small abscess in the anterior abdominal wall. - Has continued on IV abx. Has ID follow-up on 04/26/24. - Continue abx until further recs from ID  PMB, thickened endometrium - Given unable to sample lining, patient requires hysterectomy - Will reach out to Dr. Debby regarding coordinating surgery, timing as to balance healing from prior surgical complication as well as risk of delay in diagnosis of PMB. - Reviewed goal of surgery ***  ***   RTC ***.  Hoy Masters, MD Gynecologic Oncology   Medical Decision Making I personally spent  TOTAL *** minutes face-to-face and non-face-to-face in the care of this patient, which includes all pre, intra, and post visit time on the date of service. The discussion of *** is beyond the scope of routine postoperative care.   ----------------------- Reason for Visit: Postop/Treatment counseling   Interval History: Pt reports that she is recovering well from surgery. She is using *** for pain. She is eating and drinking well. She is voiding without issue and having regular bowel movements.   ***  Past Medical/Surgical History: Past Medical History:  Diagnosis Date   Arthritis    Chronic kidney disease    CKD 3a   COPD (chronic obstructive pulmonary disease) (HCC)    Diabetes mellitus    Family history of adverse reaction  to anesthesia    sister woke up during her eye surgery.   GERD (gastroesophageal reflux disease)    Heart murmur    Hypercholesteremia    Hypertension    Hypothyroidism     Past Surgical History:  Procedure Laterality Date   BREAST CYST EXCISION Left    benign cyst   CHOLECYSTECTOMY N/A 01/17/2024   Procedure: LAPAROSCOPIC CHOLECYSTECTOMY WITH INTRAOPERATIVE CHOLANGIOGRAM;  Surgeon: Sheldon Standing, MD;  Location: [facility];  Service: General;  Laterality: N/A;   COLECTOMY WITH COLOSTOMY CREATION/HARTMANN PROCEDURE  03/01/2024   Procedure: COLECTOMY, WITH COLOSTOMY CREATION;  Surgeon: Masters Hoy, MD;  Location: [facility];  Service: Gynecology;;   [name]  03/01/2024   Procedure: CYSTOSCOPY;  Surgeon: Masters Hoy, MD;  Location: [facility];  Service: Gynecology;;   DILATION AND CURETTAGE OF UTERUS N/A 01/17/2024   Procedure: DILATION AND CURETTAGE;  Surgeon: Masters Hoy, MD;  Location: [facility];  Service: Gynecology;  Laterality: N/A;   EYE SURGERY Left    cataract removal   HEMORRHOID SURGERY     HIP SURGERY     HYSTEROSCOPY WITH D & C N/A 01/06/2024   Procedure: DILATATION AND CURETTAGE /HYSTEROSCOPY;  Surgeon: Solian Vina, MD;  Location: [facility];  Service: Gynecology;  Laterality: N/A;  PAP SMEAR (DR TO BRING SUPPLIES)  TUCKERS CARD   HYSTEROSCOPY WITH D & C N/A 02/29/2024   Procedure: HYSTEROSCOPY WITH MYOSURE;  Surgeon: Masters Hoy, MD;  Location: [facility];  Service: Gynecology;  Laterality: N/A;  myosure, possible ultrasound guidance   JOINT REPLACEMENT     hip replacement   LAPAROSCOPIC LYSIS OF ADHESIONS  01/06/2024   Procedure: LYSIS, ADHESIONS, LAPAROSCOPIC;  Surgeon: Cleatus Moccasin, MD;  Location: [facility];  Service: Gynecology;;   LAPAROSCOPY N/A 01/06/2024   Procedure: LAPAROSCOPY, DIAGNOSTIC;  Surgeon: Cleatus Moccasin, MD;  Location: [facility];  Service: Gynecology;  Laterality: N/A;   LAPAROSCOPY N/A 02/29/2024   Procedure: LAPAROSCOPY, DIAGNOSTIC;  Surgeon: [name] Mays, MD;  Location: [facility];  Service: Gynecology;  Laterality: N/A;   LAPAROTOMY N/A 03/01/2024   Procedure: LAPAROTOMY, EXPLORATORY;  Surgeon: [name] Mays, MD;  Location: [facility];  Service: Gynecology;  Laterality: N/A;  Possible Ileostomy   MOBILIZATION, SPLENIC FLEXURE, WITH PARTIAL COLECTOMY  03/01/2024   Procedure: MOBILIZATION, SPLENIC FLEXURE, WITH PARTIAL COLECTOMY;  Surgeon: [name] Mays, MD;  Location: [facility];  Service: Gynecology;;   OPERATIVE ULTRASOUND N/A 02/29/2024   Procedure: US  INTRAOPERATIVE;  Surgeon: [name] Mays, MD;  Location: [facility];  Service: Gynecology;  Laterality: N/A;   SIGMOIDOSCOPY N/A 02/29/2024   Procedure: SIGMOIDOSCOPY;  Surgeon: Debby Hila, MD;  Location: [facility];  Service: General;  Laterality: N/A;  Rigid sigmoidoscopy   SIGMOIDOSCOPY  03/01/2024   Procedure: KINGSTON;  Surgeon: Debby Hila, MD;  Location: [facility];  Service: General;;   THYROID  SURGERY     TONSILLECTOMY     TUBAL LIGATION      Family History  Problem Relation Age of Onset   Breast cancer Neg Hx    Prostate cancer Neg Hx    Pancreatic cancer Neg Hx    Colon cancer Neg Hx    Endometrial cancer Neg Hx    Ovarian cancer Neg Hx     Social History   Socioeconomic History   Marital status: Single    Spouse name: Not on file   Number of children: Not on file   Years of education: Not on file   Highest education level: Not on file  Occupational History   Not on file  Tobacco Use   Smoking status: Former    Types: Cigarettes    Start date: 1967    Passive exposure: Never   Smokeless tobacco: Never  Vaping Use   Vaping status: Never Used  Substance and Sexual Activity   Alcohol use: No   Drug use: No   Sexual activity: Not Currently  Other Topics Concern   Not on file  Social History Narrative   Not on file   Social Drivers of Health   Financial Resource Strain: Not on file  Food Insecurity: No Food Insecurity (02/29/2024)   Hunger Vital Sign     Worried About Running Out of Food in the Last Year: Never true    Ran Out of Food in the Last Year: Never true  Transportation Needs: No Transportation Needs (02/29/2024)   PRAPARE - Administrator, Civil Service (Medical): No    Lack of Transportation (Non-Medical): No  Physical Activity: Not on file  Stress: Not on file  Social Connections: Patient Declined (02/29/2024)   Social Connection and Isolation Panel    Frequency of Communication with Friends and Family: Patient declined    Frequency of Social Gatherings with Friends and Family: Patient declined    Attends Religious Services: Patient declined    Database administrator or Organizations: Patient declined    Attends Banker Meetings: Patient declined    Marital Status: Patient declined    Current Medications:  Current Outpatient Medications:    bisacodyl  5 MG EC tablet, Take 5 mg by mouth daily as needed for moderate  constipation., Disp: , Rfl:    buPROPion  (WELLBUTRIN  XL) 150 MG 24 hr tablet, Take 150 mg by mouth every evening., Disp: , Rfl:    Cholecalciferol  (VITAMIN D3) 25 MCG (1000 UT) CAPS, Take 1,000 Units by mouth daily., Disp: , Rfl:    Cyanocobalamin  (VITAMIN B-12 PO), Take 1 tablet by mouth in the morning., Disp: , Rfl:    diphenhydramine -acetaminophen  (TYLENOL  PM) 25-500 MG TABS tablet, Take 2 tablets by mouth at bedtime as needed (Sleep/Pain)., Disp: , Rfl:    ezetimibe  (ZETIA ) 10 MG tablet, Take 10 mg by mouth daily., Disp: , Rfl:    ferrous sulfate 325 (65 FE) MG tablet, Take 325 mg by mouth daily with breakfast., Disp: , Rfl:    fluticasone  (FLONASE ) 50 MCG/ACT nasal spray, Place 2 sprays into both nostrils daily as needed for rhinitis., Disp: , Rfl:    hydrochlorothiazide  (HYDRODIURIL ) 25 MG tablet, Take 1 tablet (25 mg total) by mouth daily., Disp: 30 tablet, Rfl: 0   levothyroxine  (SYNTHROID ) 75 MCG tablet, Take 75 mcg by mouth daily before breakfast., Disp: , Rfl:    lidocaine  (LIDODERM )  5 %, Place 1 patch onto the skin daily. Remove & Discard patch within 12 hours or as directed by MD. Apply to the right mid abdomen., Disp: 30 patch, Rfl: 1   losartan  (COZAAR ) 50 MG tablet, Take 50 mg by mouth in the morning., Disp: , Rfl:    magnesium  oxide (MAG-OX) 400 MG tablet, Take 400 mg by mouth daily., Disp: , Rfl:    meropenem  (MERREM ) IVPB, Inject 1 g into the vein every 12 (twelve) hours for 22 days. Indication:  Polymicrobial intra-abdominal infection First Dose: Yes Last Day of Therapy:  04/20/24 Labs - Once weekly:  CBC/D and BMP, Labs - Once weekly: ESR and CRP Method of administration: Mini-Bag Plus / Gravity Method of administration may be changed at the discretion of home infusion pharmacist based upon assessment of the patient and/or caregiver's ability to self-administer the medication ordered., Disp: 44 Units, Rfl: 0   methocarbamol  (ROBAXIN ) 500 MG tablet, Take 1 tablet (500 mg total) by mouth 2 (two) times daily as needed for muscle spasms., Disp: 14 tablet, Rfl: 0   mirtazapine  (REMERON ) 7.5 MG tablet, Take 1 tablet (7.5 mg total) by mouth at bedtime., Disp: 30 tablet, Rfl: 3   mometasone (ELOCON) 0.1 % cream, Apply 1 Application topically daily as needed (Dermatitis- affected sites)., Disp: , Rfl:    Nutritional Supplements (FEEDING SUPPLEMENT, KATE FARMS STANDARD 1.4,) LIQD liquid, Take 325 mLs by mouth 3 (three) times daily between meals., Disp: 325 mL, Rfl: 3   omeprazole (PRILOSEC) 40 MG capsule, Take 40 mg by mouth daily before breakfast., Disp: , Rfl:    ondansetron  (ZOFRAN ) 4 MG tablet, Take 1 tablet (4 mg total) by mouth every 6 (six) hours as needed for nausea., Disp: 20 tablet, Rfl: 0   oxyCODONE  10 MG TABS, Take 0.5-1 tablets (5-10 mg total) by mouth every 6 (six) hours as needed for moderate pain (pain score 4-6) (5 mg for moderate pain, 10 mg for severe pain per Dr. [name])., Disp: 30 tablet, Rfl: 0   polyethylene glycol (MIRALAX  / GLYCOLAX ) 17 g packet, Take 17 g by  mouth daily. Do not give if ostomy output loose, Disp: 14 each, Rfl: 0   sitaGLIPtin (JANUVIA) 50 MG tablet, Take 50 mg by mouth daily., Disp: , Rfl:    sodium chloride  0.9 % injection, 5 mLs by Other route 3 (three) times  daily. Flush JP drain (abscess drain) with 5 cc sterile saline three times daily, Disp: 5 mL, Rfl: 60   triamcinolone ointment (KENALOG) 0.5 %, Apply 1 Application topically 2 (two) times daily as needed (for itching)., Disp: , Rfl:    TYLENOL  8 HOUR ARTHRITIS PAIN 650 MG CR tablet, Take 650-1,300 mg by mouth every 8 (eight) hours as needed for pain., Disp: , Rfl:   Review of Symptoms: Complete 10-system review is negative except ***as above in Interval History.  Physical Exam: There were no vitals taken for this visit. General: ***Alert, oriented, no acute distress. HEENT: ***Normocephalic, atraumatic. Neck symmetric without masses. Sclera anicteric.  Chest: ***Normal work of breathing. ***Clear to auscultation bilaterally.   Cardiovascular: ***Regular rate and rhythm, no murmurs. Abdomen: ***Soft, nontender.  Normoactive bowel sounds.  No masses appreciated.  ***Well-healing incision***s. Extremities: ***Grossly normal range of motion.  Warm, well perfused.  No edema bilaterally. Skin: ***No rashes or lesions noted. GU: Normal appearing external genitalia without erythema, excoriation, or lesions.  Speculum exam reveals ***.  Bimanual exam reveals ***. Exam chaperoned by ***  Laboratory & Radiologic Studies: Surgical pathology (03/01/24): A. ENDOMETRIAL, BIOPSY:  Abundant blood with minute benign squamous fragment.  No endometrial tissue present.   B. COLON, PORTION, RESECTION:  Transmural defect associated with hemorrhage.  Negative for malignancy.

## 2024-04-24 ENCOUNTER — Encounter: Payer: Self-pay | Admitting: Oncology

## 2024-04-24 ENCOUNTER — Encounter: Payer: Self-pay | Admitting: Psychiatry

## 2024-04-24 ENCOUNTER — Inpatient Hospital Stay: Attending: Psychiatry | Admitting: Psychiatry

## 2024-04-24 VITALS — BP 138/77 | HR 96 | Temp 98.1°F | Resp 19 | Wt 145.0 lb

## 2024-04-24 DIAGNOSIS — T8149XA Infection following a procedure, other surgical site, initial encounter: Secondary | ICD-10-CM

## 2024-04-24 DIAGNOSIS — R9389 Abnormal findings on diagnostic imaging of other specified body structures: Secondary | ICD-10-CM

## 2024-04-24 DIAGNOSIS — N950 Postmenopausal bleeding: Secondary | ICD-10-CM

## 2024-04-24 NOTE — Patient Instructions (Signed)
 It was a pleasure to see you in clinic today. - We will try to get you in to the colon doctor for follow-up and coordinate future surgery - Plan for visit on Friday for wound/ostomy clinic - Follow-up with infection team this week - Follow-up with me once we have coordinated a surgery plan with Dr. Debby.  Thank you very much for allowing me to provide care for you today.  I appreciate your confidence in choosing our Gynecologic Oncology team at [facility].  If you have any questions about your visit today please call our office or send us  a MyChart message and we will get back to you as soon as possible.

## 2024-04-24 NOTE — Progress Notes (Signed)
 Referral placed to the outpatient ostomy clinic.  Patient has been scheduled for 04/28/24 at 10:00.

## 2024-04-24 NOTE — Progress Notes (Signed)
 Patient's ostomy pouch changed and replaced. Skin cleaned, skin protection and stoma powder applied. Adapt CeraRing molded to size and placed around stoma. New 2 piece Adapt system cut to size wafer and applied and bag secured.   Shallow wound to lower mid-line approximately 5 cm around with healthy granulation tissue present. A deeper 1/2 cm round area tunnel to approximately 3.5 cm deep was  packed with 1/2 inch Iodoform packing strip and dry sterile dressing applied over packing and secured with medipore tape.   JP drain intact and secure. Alexandria Durham tolerated procedure well and was accompanied by her sister [name].

## 2024-04-25 ENCOUNTER — Telehealth: Payer: Self-pay | Admitting: *Deleted

## 2024-04-25 ENCOUNTER — Encounter: Payer: Self-pay | Admitting: Infectious Disease

## 2024-04-25 DIAGNOSIS — K651 Peritoneal abscess: Secondary | ICD-10-CM | POA: Insufficient documentation

## 2024-04-25 NOTE — Telephone Encounter (Signed)
 Attempted to reach patient. Unable to leave a message due to her mailbox being full. Spoke with patient's sister [name] Clause and relayed message that the office spoke with [facility] and they are aware nurse will be going to the home today at [address] [place], where patient is staying with her sister [name]. Spoke with Georgette at [facility] and patient's sister Doris's contact number given. Doris verbalized understanding and thanked the office for calling and states they will look at [facility] cell phone and empty her mailbox so she is able to receive messages.

## 2024-04-25 NOTE — Progress Notes (Unsigned)
 Subjective:  Chief complaint: follow-up for intraabdominal abscesses  Patient ID: Alexandria Durham, female    DOB: 07/15/1946, 78 y.o.   MRN: [id number]  HPI  Discussed the use of AI scribe software for clinical note transcription with the patient, who gave verbal consent to proceed.  History of Present Illness   Alexandria Durham is a 78 year old female who presents for follow-up of intra-abdominal abscesses.   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 This was complicated by uterine perforation and sigmoid injury, necessitating an exploratory laparotomy with partial colectomy and colostomy creation on March 01, 2024. She also underwent a laparoscopic cholecystectomy in May 2025 for gallstone pancreatitis and cholecystitis.  Following her surgery, she developed an intrab-abdominal abscess. Cultures from the JP drain and from the wound initially showed multiple multidrug-resistant organisms. A deep culture from the abdomen later identified E. coli, Pseudomonas aeruginosa, and Bacteroides species, which were beta-lactamase positive. She was treated with meropenem  to cover these organisms and was on antibiotics for at least a month. Her antibiotics were tentatively scheduled to stop on April 20, 2024, pending repeat imaging.  Recent imaging revealed that the left lower quadrant abscess in the abdominal cavity had resolved, but two small abscesses were found in the abdominal wall near the umbilicus. A sinogram indicated a connection to the colon. Her PICC was DC'd along with merrem .  She experienced a rash all over her body, including her face and around her eyes, while on antibiotics. The rash persisted throughout her antibiotic treatment, but she did not experience other symptoms. I am curious why we were not alerted tot this.. She has been off antibiotics since the previous Thursday and reports feeling better since stopping them, as the  antibiotics caused her to 'break out real bad'.  She was discharged to a skilled nursing facility and is now back home. She is scheduled for follow-up imaging on May 03, 2024. No fevers or chills since stopping antibiotics.       Past Medical History:  Diagnosis Date   Abscess of abdominal cavity (HCC) 04/25/2024   Arthritis    Chronic kidney disease    CKD 3a   COPD (chronic obstructive pulmonary disease) (HCC)    Diabetes mellitus    Family history of adverse reaction to anesthesia    sister woke up during her eye surgery.   GERD (gastroesophageal reflux disease)    Heart murmur    Hypercholesteremia    Hypertension    Hypothyroidism     Past Surgical History:  Procedure Laterality Date   BREAST CYST EXCISION Left    benign cyst   CHOLECYSTECTOMY N/A 01/17/2024   Procedure: LAPAROSCOPIC CHOLECYSTECTOMY WITH INTRAOPERATIVE CHOLANGIOGRAM;  Surgeon: Sheldon Standing, MD;  Location: [facility];  Service: General;  Laterality: N/A;   COLECTOMY WITH COLOSTOMY CREATION/HARTMANN PROCEDURE  03/01/2024   Procedure: COLECTOMY, WITH COLOSTOMY CREATION;  Surgeon: [name] Mays, MD;  Location: [facility];  Service: Gynecology;;   [name]  03/01/2024   Procedure: CYSTOSCOPY;  Surgeon: [name] Mays, MD;  Location: [facility];  Service: Gynecology;;   DILATION AND CURETTAGE OF UTERUS N/A 01/17/2024   Procedure: DILATION AND CURETTAGE;  Surgeon: [name] Mays, MD;  Location: [facility];  Service: Gynecology;  Laterality: N/A;   EYE SURGERY Left    cataract removal   HEMORRHOID SURGERY     HIP SURGERY     HYSTEROSCOPY WITH D & C N/A 01/06/2024   Procedure: DILATATION  AND CURETTAGE /HYSTEROSCOPY;  Surgeon: Cleatus Moccasin, MD;  Location: [facility];  Service: Gynecology;  Laterality: N/A;  PAP SMEAR (DR TO BRING SUPPLIES)  TUCKERS CARD   HYSTEROSCOPY WITH D & C N/A 02/29/2024   Procedure: HYSTEROSCOPY WITH MYOSURE;  Surgeon: [name] Mays, MD;  Location: [facility];  Service: Gynecology;  Laterality: N/A;   myosure, possible ultrasound guidance   JOINT REPLACEMENT     hip replacement   LAPAROSCOPIC LYSIS OF ADHESIONS  01/06/2024   Procedure: LYSIS, ADHESIONS, LAPAROSCOPIC;  Surgeon: Cleatus Moccasin, MD;  Location: [facility];  Service: Gynecology;;   LAPAROSCOPY N/A 01/06/2024   Procedure: LAPAROSCOPY, DIAGNOSTIC;  Surgeon: Cleatus Moccasin, MD;  Location: [facility];  Service: Gynecology;  Laterality: N/A;   LAPAROSCOPY N/A 02/29/2024   Procedure: LAPAROSCOPY, DIAGNOSTIC;  Surgeon: [name] Mays, MD;  Location: [facility];  Service: Gynecology;  Laterality: N/A;   LAPAROTOMY N/A 03/01/2024   Procedure: LAPAROTOMY, EXPLORATORY;  Surgeon: [name] Mays, MD;  Location: [facility];  Service: Gynecology;  Laterality: N/A;  Possible Ileostomy   MOBILIZATION, SPLENIC FLEXURE, WITH PARTIAL COLECTOMY  03/01/2024   Procedure: MOBILIZATION, SPLENIC FLEXURE, WITH PARTIAL COLECTOMY;  Surgeon: [name] Mays, MD;  Location: [facility];  Service: Gynecology;;   OPERATIVE ULTRASOUND N/A 02/29/2024   Procedure: US  INTRAOPERATIVE;  Surgeon: [name] Mays, MD;  Location: [facility];  Service: Gynecology;  Laterality: N/A;   SIGMOIDOSCOPY N/A 02/29/2024   Procedure: SIGMOIDOSCOPY;  Surgeon: Debby Hila, MD;  Location: [facility];  Service: General;  Laterality: N/A;  Rigid sigmoidoscopy   SIGMOIDOSCOPY  03/01/2024   Procedure: [place];  Surgeon: Debby Hila, MD;  Location: [facility];  Service: General;;   THYROID  SURGERY     TONSILLECTOMY     TUBAL LIGATION      Family History  Problem Relation Age of Onset   Breast cancer Neg Hx    Prostate cancer Neg Hx    Pancreatic cancer Neg Hx    Colon cancer Neg Hx    Endometrial cancer Neg Hx    Ovarian cancer Neg Hx       Social History   Socioeconomic History   Marital status: Single    Spouse name: Not on file   Number of children: Not on file   Years of education: Not on file   Highest education level: Not on file  Occupational History   Not on file  Tobacco Use    Smoking status: Former    Types: Cigarettes    Start date: 1967    Passive exposure: Never   Smokeless tobacco: Never  Vaping Use   Vaping status: Never Used  Substance and Sexual Activity   Alcohol use: No   Drug use: No   Sexual activity: Not Currently  Other Topics Concern   Not on file  Social History Narrative   Not on file   Social Drivers of Health   Financial Resource Strain: Not on file  Food Insecurity: No Food Insecurity (02/29/2024)   Hunger Vital Sign    Worried About Running Out of Food in the Last Year: Never true    Ran Out of Food in the Last Year: Never true  Transportation Needs: No Transportation Needs (02/29/2024)   PRAPARE - Administrator, Civil Service (Medical): No    Lack of Transportation (Non-Medical): No  Physical Activity: Not on file  Stress: Not on file  Social Connections: Patient Declined (02/29/2024)   Social Connection and Isolation Panel    Frequency of Communication  with Friends and Family: Patient declined    Frequency of Social Gatherings with Friends and Family: Patient declined    Attends Religious Services: Patient declined    Database administrator or Organizations: Patient declined    Attends Engineer, structural: Patient declined    Marital Status: Patient declined    Allergies  Allergen Reactions   Ace Inhibitors Hives and Itching   Tape Other (See Comments)    Skin is sensitive   Aspirin Palpitations   Codeine Palpitations     Current Outpatient Medications:    bisacodyl  5 MG EC tablet, Take 5 mg by mouth daily as needed for moderate constipation., Disp: , Rfl:    buPROPion  (WELLBUTRIN  XL) 150 MG 24 hr tablet, Take 150 mg by mouth every evening., Disp: , Rfl:    Cholecalciferol  (VITAMIN D3) 25 MCG (1000 UT) CAPS, Take 1,000 Units by mouth daily., Disp: , Rfl:    Cyanocobalamin  (VITAMIN B-12 PO), Take 1 tablet by mouth in the morning., Disp: , Rfl:    diphenhydramine -acetaminophen  (TYLENOL  PM) 25-500 MG  TABS tablet, Take 2 tablets by mouth at bedtime as needed (Sleep/Pain)., Disp: , Rfl:    ezetimibe  (ZETIA ) 10 MG tablet, Take 10 mg by mouth daily., Disp: , Rfl:    ferrous sulfate 325 (65 FE) MG tablet, Take 325 mg by mouth daily with breakfast., Disp: , Rfl:    fluticasone  (FLONASE ) 50 MCG/ACT nasal spray, Place 2 sprays into both nostrils daily as needed for rhinitis., Disp: , Rfl:    hydrochlorothiazide  (HYDRODIURIL ) 25 MG tablet, Take 1 tablet (25 mg total) by mouth daily., Disp: 30 tablet, Rfl: 0   levothyroxine  (SYNTHROID ) 75 MCG tablet, Take 75 mcg by mouth daily before breakfast., Disp: , Rfl:    lidocaine  (LIDODERM ) 5 %, Place 1 patch onto the skin daily. Remove & Discard patch within 12 hours or as directed by MD. Apply to the right mid abdomen., Disp: 30 patch, Rfl: 1   losartan  (COZAAR ) 50 MG tablet, Take 50 mg by mouth in the morning., Disp: , Rfl:    magnesium  oxide (MAG-OX) 400 MG tablet, Take 400 mg by mouth daily., Disp: , Rfl:    meropenem  (MERREM ) IVPB, Inject 1 g into the vein every 12 (twelve) hours for 22 days. Indication:  Polymicrobial intra-abdominal infection First Dose: Yes Last Day of Therapy:  04/20/24 Labs - Once weekly:  CBC/D and BMP, Labs - Once weekly: ESR and CRP Method of administration: Mini-Bag Plus / Gravity Method of administration may be changed at the discretion of home infusion pharmacist based upon assessment of the patient and/or caregiver's ability to self-administer the medication ordered., Disp: 44 Units, Rfl: 0   methocarbamol  (ROBAXIN ) 500 MG tablet, Take 1 tablet (500 mg total) by mouth 2 (two) times daily as needed for muscle spasms., Disp: 14 tablet, Rfl: 0   mirtazapine  (REMERON ) 7.5 MG tablet, Take 1 tablet (7.5 mg total) by mouth at bedtime., Disp: 30 tablet, Rfl: 3   mometasone (ELOCON) 0.1 % cream, Apply 1 Application topically daily as needed (Dermatitis- affected sites)., Disp: , Rfl:    Nutritional Supplements (FEEDING SUPPLEMENT, KATE FARMS  STANDARD 1.4,) LIQD liquid, Take 325 mLs by mouth 3 (three) times daily between meals., Disp: 325 mL, Rfl: 3   omeprazole (PRILOSEC) 40 MG capsule, Take 40 mg by mouth daily before breakfast., Disp: , Rfl:    ondansetron  (ZOFRAN ) 4 MG tablet, Take 1 tablet (4 mg total) by mouth every 6 (six)  hours as needed for nausea., Disp: 20 tablet, Rfl: 0   oxyCODONE  10 MG TABS, Take 0.5-1 tablets (5-10 mg total) by mouth every 6 (six) hours as needed for moderate pain (pain score 4-6) (5 mg for moderate pain, 10 mg for severe pain per Dr. [name])., Disp: 30 tablet, Rfl: 0   polyethylene glycol (MIRALAX  / GLYCOLAX ) 17 g packet, Take 17 g by mouth daily. Do not give if ostomy output loose, Disp: 14 each, Rfl: 0   sitaGLIPtin (JANUVIA) 50 MG tablet, Take 50 mg by mouth daily., Disp: , Rfl:    sodium chloride  0.9 % injection, 5 mLs by Other route 3 (three) times daily. Flush JP drain (abscess drain) with 5 cc sterile saline three times daily, Disp: 5 mL, Rfl: 60   triamcinolone ointment (KENALOG) 0.5 %, Apply 1 Application topically 2 (two) times daily as needed (for itching)., Disp: , Rfl:    TYLENOL  8 HOUR ARTHRITIS PAIN 650 MG CR tablet, Take 650-1,300 mg by mouth every 8 (eight) hours as needed for pain., Disp: , Rfl:     Review of Systems  Constitutional:  Negative for activity change, appetite change, chills, diaphoresis, fatigue, fever and unexpected weight change.  HENT:  Negative for congestion, rhinorrhea, sinus pressure, sneezing, sore throat and trouble swallowing.   Eyes:  Negative for photophobia and visual disturbance.  Respiratory:  Negative for cough, chest tightness, shortness of breath, wheezing and stridor.   Cardiovascular:  Negative for chest pain, palpitations and leg swelling.  Gastrointestinal:  Negative for abdominal distention, abdominal pain, anal bleeding, blood in stool, constipation, diarrhea, nausea and vomiting.  Genitourinary:  Negative for difficulty urinating, dysuria, flank  pain and hematuria.  Musculoskeletal:  Negative for arthralgias, back pain, gait problem, joint swelling and myalgias.  Skin:  Positive for rash. Negative for color change, pallor and wound.  Neurological:  Negative for dizziness, tremors, weakness and light-headedness.  Hematological:  Negative for adenopathy. Does not bruise/bleed easily.  Psychiatric/Behavioral:  Negative for agitation, behavioral problems, confusion, decreased concentration, dysphoric mood and sleep disturbance.        Objective:   Physical Exam Constitutional:      General: She is not in acute distress.    Appearance: Normal appearance. She is well-developed. She is not ill-appearing or diaphoretic.  HENT:     Head: Normocephalic and atraumatic.     Right Ear: Hearing and external ear normal.     Left Ear: Hearing and external ear normal.     Nose: No nasal deformity or rhinorrhea.  Eyes:     General: No scleral icterus.    Conjunctiva/sclera: Conjunctivae normal.     Right eye: Right conjunctiva is not injected.     Left eye: Left conjunctiva is not injected.     Pupils: Pupils are equal, round, and reactive to light.  Neck:     Vascular: No JVD.  Cardiovascular:     Rate and Rhythm: Normal rate and regular rhythm.     Heart sounds: Normal heart sounds, S1 normal and S2 normal. No murmur heard.    No friction rub.  Abdominal:     General: Bowel sounds are normal. There is no distension.     Palpations: Abdomen is soft.     Tenderness: There is no abdominal tenderness.  Musculoskeletal:        General: Normal range of motion.     Right shoulder: Normal.     Left shoulder: Normal.     Cervical back: Normal  range of motion and neck supple.     Right hip: Normal.     Left hip: Normal.     Right knee: Normal.     Left knee: Normal.  Lymphadenopathy:     Head:     Right side of head: No submandibular, preauricular or posterior auricular adenopathy.     Left side of head: No submandibular, preauricular  or posterior auricular adenopathy.     Cervical: No cervical adenopathy.     Right cervical: No superficial or deep cervical adenopathy.    Left cervical: No superficial or deep cervical adenopathy.  Skin:    General: Skin is warm and dry.     Coloration: Skin is not pale.     Findings: No abrasion, bruising, ecchymosis, erythema, lesion or rash.     Nails: There is no clubbing.  Neurological:     Mental Status: She is alert and oriented to person, place, and time.     Sensory: No sensory deficit.     Coordination: Coordination normal.     Gait: Gait normal.  Psychiatric:        Attention and Perception: She is attentive.        Mood and Affect: Mood normal.        Speech: Speech normal.        Behavior: Behavior normal. Behavior is cooperative.        Thought Content: Thought content normal.        Judgment: Judgment normal.           Assessment & Plan:   Assessment and Plan    Abdominal wall abscesses Two small abscesses near the umbilicus on CT scan. Previous left lower quadrant abscess resolved. 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 - surgical intervention with general surgeon and gynecologist to address colocutaneous fistula and perform complete hysterectomy. - Maintain current drain placement for several weeks. 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 Fistula likely causing recurrent abscesses by bacterial pathway. Surgical intervention necessary. - Plan surgical intervention with general surgeon and gynecologist to address colocutaneous fistula and perform complete hysterectomy.  Adverse reaction to meropenem  (rash) Rash developed during meropenem  treatment, diffuse over body including face and eyes.

## 2024-04-25 NOTE — Telephone Encounter (Signed)
 Received a call from [facility] nurse Brit who called to clarify orders for patient's wound dressing changes daily. Benjamine is aware that patient's ostomy had been changed in our office yesterday and that patient has a new patient appointment with the Outpatient Ostomy Clinic at [facility] on Friday, August 29 th. At 1000.

## 2024-04-26 ENCOUNTER — Other Ambulatory Visit: Payer: Self-pay

## 2024-04-26 ENCOUNTER — Ambulatory Visit: Admitting: Infectious Disease

## 2024-04-26 VITALS — BP 112/78 | HR 102 | Temp 97.8°F

## 2024-04-26 DIAGNOSIS — L02211 Cutaneous abscess of abdominal wall: Secondary | ICD-10-CM | POA: Diagnosis not present

## 2024-04-26 DIAGNOSIS — N1831 Chronic kidney disease, stage 3a: Secondary | ICD-10-CM

## 2024-04-26 DIAGNOSIS — B9689 Other specified bacterial agents as the cause of diseases classified elsewhere: Secondary | ICD-10-CM | POA: Diagnosis not present

## 2024-04-26 DIAGNOSIS — B9620 Unspecified Escherichia coli [E. coli] as the cause of diseases classified elsewhere: Secondary | ICD-10-CM | POA: Diagnosis not present

## 2024-04-26 DIAGNOSIS — N719 Inflammatory disease of uterus, unspecified: Secondary | ICD-10-CM

## 2024-04-26 DIAGNOSIS — K859 Acute pancreatitis without necrosis or infection, unspecified: Secondary | ICD-10-CM

## 2024-04-26 DIAGNOSIS — K651 Peritoneal abscess: Secondary | ICD-10-CM

## 2024-04-26 DIAGNOSIS — S3769XD Other injury of uterus, subsequent encounter: Secondary | ICD-10-CM

## 2024-04-26 DIAGNOSIS — L270 Generalized skin eruption due to drugs and medicaments taken internally: Secondary | ICD-10-CM

## 2024-04-26 DIAGNOSIS — L988 Other specified disorders of the skin and subcutaneous tissue: Secondary | ICD-10-CM

## 2024-04-26 DIAGNOSIS — T368X5A Adverse effect of other systemic antibiotics, initial encounter: Secondary | ICD-10-CM

## 2024-04-26 MED ORDER — CIPROFLOXACIN HCL 500 MG PO TABS: 500.0000 mg | ORAL_TABLET | Freq: Two times a day (BID) | ORAL | 1 refills | Status: DC

## 2024-04-26 MED ORDER — METRONIDAZOLE 500 MG PO TABS: 500.0000 mg | ORAL_TABLET | Freq: Two times a day (BID) | ORAL | 1 refills | Status: AC

## 2024-04-28 ENCOUNTER — Ambulatory Visit (HOSPITAL_COMMUNITY)
Admission: RE | Admit: 2024-04-28 | Discharge: 2024-04-28 | Disposition: A | Discharge status: Home / Self Care | Source: Ambulatory Visit | Attending: Nurse Practitioner | Admitting: Nurse Practitioner

## 2024-04-28 DIAGNOSIS — K9409 Other complications of colostomy: Secondary | ICD-10-CM | POA: Diagnosis not present

## 2024-04-28 DIAGNOSIS — L24B3 Irritant contact dermatitis related to fecal or urinary stoma or fistula: Secondary | ICD-10-CM | POA: Diagnosis not present

## 2024-04-28 DIAGNOSIS — Z433 Encounter for attention to colostomy: Secondary | ICD-10-CM | POA: Diagnosis not present

## 2024-04-28 DIAGNOSIS — T8189XA Other complications of procedures, not elsewhere classified, initial encounter: Secondary | ICD-10-CM | POA: Diagnosis not present

## 2024-04-28 DIAGNOSIS — L02211 Cutaneous abscess of abdominal wall: Secondary | ICD-10-CM | POA: Diagnosis present

## 2024-04-28 NOTE — Discharge Instructions (Addendum)
 Wound care instructions:  Cleanse wound with saline and pat dry. Fill in tunnel in wound with strip of Aquacel.  Aquacel to wound bed.  Cover with dry gauze, ABD pad and tape.  May change 2-3 times weekly if using aquacel.    Ostomy care:  Remove old pouch and clean with soap and water  and pat dry.  Fill separation (left side of stoma) with stoma powder  item # [id number] and any irritated skin around stoma.  Seal with skin prep  item # [id number] Apply piece of barrier ring  item [phone] to crease at 3 and 9 o'clock and rest of barrier ring around stoma (you took photo of this step)  Apply 1 piece convex filtered pouch  item [phone]- barrier cut off center to avoid wound and pattern in your bag.  Apply barrier extenders to secure pouch  item #[id number] Apply ostomy belt  item # [phone]  Call clinic as needed:  [phone]  Jeana [phone]

## 2024-04-28 NOTE — Progress Notes (Signed)
 [facility]   Reason for visit:  LUQ colostomy, frequent leaks Midline abdominal wound Establish supplies with [facility] HPI:  Abdominal abscess with colostomy Past Medical History:  Diagnosis Date   Abscess of abdominal cavity (HCC) 04/25/2024   Arthritis    Chronic kidney disease    CKD 3a   COPD (chronic obstructive pulmonary disease) (HCC)    Diabetes mellitus    Family history of adverse reaction to anesthesia    sister woke up during her eye surgery.   GERD (gastroesophageal reflux disease)    Heart murmur    Hypercholesteremia    Hypertension    Hypothyroidism    Family History  Problem Relation Age of Onset   Breast cancer Neg Hx    Prostate cancer Neg Hx    Pancreatic cancer Neg Hx    Colon cancer Neg Hx    Endometrial cancer Neg Hx    Ovarian cancer Neg Hx    Allergies  Allergen Reactions   Ace Inhibitors Hives and Itching   Tape Other (See Comments)    Skin is sensitive   Aspirin Palpitations   Codeine Palpitations   Merrem  [Meropenem ] Rash   Current Outpatient Medications  Medication Sig Dispense Refill Last Dose/Taking   bisacodyl  5 MG EC tablet Take 5 mg by mouth daily as needed for moderate constipation.      buPROPion  (WELLBUTRIN  XL) 150 MG 24 hr tablet Take 150 mg by mouth every evening.      Cholecalciferol  (VITAMIN D3) 25 MCG (1000 UT) CAPS Take 1,000 Units by mouth daily. (Patient not taking: Reported on 04/26/2024)      ciprofloxacin  (CIPRO ) 500 MG tablet Take 1 tablet (500 mg total) by mouth 2 (two) times daily. 60 tablet 1    Cyanocobalamin  (VITAMIN B-12 PO) Take 1 tablet by mouth in the morning.      diphenhydramine -acetaminophen  (TYLENOL  PM) 25-500 MG TABS tablet Take 2 tablets by mouth at bedtime as needed (Sleep/Pain).      ezetimibe  (ZETIA ) 10 MG tablet Take 10 mg by mouth daily.      ferrous sulfate 325 (65 FE) MG tablet Take 325 mg by mouth daily with breakfast. (Patient not taking: Reported on 04/26/2024)      fluticasone   (FLONASE ) 50 MCG/ACT nasal spray Place 2 sprays into both nostrils daily as needed for rhinitis.      hydrochlorothiazide  (HYDRODIURIL ) 25 MG tablet Take 1 tablet (25 mg total) by mouth daily. 30 tablet 0    levothyroxine  (SYNTHROID ) 75 MCG tablet Take 75 mcg by mouth daily before breakfast.      lidocaine  (LIDODERM ) 5 % Place 1 patch onto the skin daily. Remove & Discard patch within 12 hours or as directed by MD. Apply to the right mid abdomen. 30 patch 1    losartan  (COZAAR ) 50 MG tablet Take 50 mg by mouth in the morning.      magnesium  oxide (MAG-OX) 400 MG tablet Take 400 mg by mouth daily.      methocarbamol  (ROBAXIN ) 500 MG tablet Take 1 tablet (500 mg total) by mouth 2 (two) times daily as needed for muscle spasms. 14 tablet 0    metroNIDAZOLE  (FLAGYL ) 500 MG tablet Take 1 tablet (500 mg total) by mouth 2 (two) times daily. 60 tablet 1    mirtazapine  (REMERON ) 7.5 MG tablet Take 1 tablet (7.5 mg total) by mouth at bedtime. 30 tablet 3    mometasone (ELOCON) 0.1 % cream Apply 1 Application topically daily as  needed (Dermatitis- affected sites).      Nutritional Supplements (FEEDING SUPPLEMENT, KATE FARMS STANDARD 1.4,) LIQD liquid Take 325 mLs by mouth 3 (three) times daily between meals. 325 mL 3    omeprazole (PRILOSEC) 40 MG capsule Take 40 mg by mouth daily before breakfast.      ondansetron  (ZOFRAN ) 4 MG tablet Take 1 tablet (4 mg total) by mouth every 6 (six) hours as needed for nausea. 20 tablet 0    oxyCODONE  10 MG TABS Take 0.5-1 tablets (5-10 mg total) by mouth every 6 (six) hours as needed for moderate pain (pain score 4-6) (5 mg for moderate pain, 10 mg for severe pain per Dr. [name]). 30 tablet 0    polyethylene glycol (MIRALAX  / GLYCOLAX ) 17 g packet Take 17 g by mouth daily. Do not give if ostomy output loose (Patient not taking: Reported on 04/26/2024) 14 each 0    sitaGLIPtin (JANUVIA) 50 MG tablet Take 50 mg by mouth daily.      sodium chloride  0.9 % injection 5 mLs by Other  route 3 (three) times daily. Flush JP drain (abscess drain) with 5 cc sterile saline three times daily 5 mL 60    triamcinolone ointment (KENALOG) 0.5 % Apply 1 Application topically 2 (two) times daily as needed (for itching).      TYLENOL  8 HOUR ARTHRITIS PAIN 650 MG CR tablet Take 650-1,300 mg by mouth every 8 (eight) hours as needed for pain.      No current facility-administered medications for this encounter.   ROS  Review of Systems  Constitutional:  Positive for fatigue.  Respiratory:         COPD  Cardiovascular:        Hypertension  Gastrointestinal:        Abdominal abscess Colostomy  Endocrine:       Diabetes  Skin:  Positive for color change, rash and wound.       Peristomal irritation Periwound irritation due to excess drainage  Psychiatric/Behavioral: Negative.    All other systems reviewed and are negative.  Vital signs:  BP (!) 141/71   Pulse 98   Temp (!) 97.5 F (36.4 C) (Oral)   Resp 18   SpO2 97%  Exam:  Physical Exam Vitals reviewed.  Constitutional:      Appearance: Normal appearance.  Neck:     Comments: Arthritis Cardiovascular:     Rate and Rhythm: Normal rate.     Pulses: Normal pulses.  Pulmonary:     Effort: Pulmonary effort is normal.  Abdominal:     Palpations: Abdomen is soft.     Comments: JP drain Midline wound, tunneling in wound bed, filled with silver  hydrofiber  Skin:    General: Skin is warm and dry.     Findings: Erythema present.     Comments: Skin breakdown as noted  Neurological:     Mental Status: She is alert and oriented to person, place, and time.  Psychiatric:        Mood and Affect: Mood normal.     Stoma type/location:  LUQ colostomy Stomal assessment/size:  oval, pink moist and productive  Stomal separation 9 to 11 o'clock Peristomal assessment:   skin creasing at 3 and 9 o'clock, contact dermatitis around stoma  Treatment options for stomal/peristomal skin: piece of barrier ring in creasing  Stoma powder  and skin prep to irritated skin  stoma powder in separated mucosa Output: soft brown stool  Ostomy pouching: 1pc. Flexible convex with  barrier ring around stoma and in skin folds around stoma Filtered pouch needed due to flatus.  Education provided:  Performed pouch change for patient and daughter.  Typed step by step instructions for [facility] Nurse.  Will set up with [facility] for supplies.     Impression/dx  Colostomy Abdominal abscess JP drain Left abdomen Discussion  Steps for pouch change Wound care. Has been packing with packing strip to midline abdominal wound (tunnel)  Moderate creamy drainage, needs more absorptive dressing.  Will switch to Aquacel . Demonstrated for daughter, provided supplies (aquacel)  Plan  See back one week to assess pouching and wound     Visit time: 60 minutes.   [name] FNP-BC

## 2024-05-02 NOTE — Progress Notes (Signed)
 Referring Physician(s): Pommier,Wyatt H  Chief Complaint: The patient is seen in follow up today s/p left paracolic gutter fluid collection drain placement 03/23/24  History of present illness:  Alexandria Durham, 78 year old female, has a medical history significant for CKD, COPD, DM, HTN, post-menopausal bleeding/thickened endometrium, pancreatitis and acute cholecystitis. She is s/p laparoscopic cholecystectomy and D&C May 2025. She was admitted to the hospital July 1 for a hysteroscopy with repeat D&C. The procedure was complicated by perforation of the uterus, injury to the sigmoid colon and septic shock. On 7/2 she was taken back to the OR for exploratory laparotomy, descending end colostomy and partial colectomy. She had a surgically placed JP drain plus wound vac left in place post-procedure.   She had persistent abdominal pain post-procedure and a repeat CT 03/22/24 showed a fluid/gas collection along the left paracolic gutter. IR was consulted for aspiration/drain placement and she received a 10 Fr LLQ drain 03/23/24. She was discharged from the hospital 04/04/24 and she followed up with IR 04/19/24. CT imaging showed a small, persistent fluid collection and drain injection was positive for a small fistulous connection to the bowel. The patient was instructed to discontinue flushing with normal saline and the drain was left to bulb suction given the residual fluid.   She presents to the outpatient IR clinic today for a repeat drain evaluation. The bulb is filled halfway with a green/tan fluid. She has not emptied the bulb since she was last at the clinic. She denies any significant pain/discomfort.   Past Medical History:  Diagnosis Date   Abscess of abdominal cavity (HCC) 04/25/2024   Arthritis    Chronic kidney disease    CKD 3a   COPD (chronic obstructive pulmonary disease) (HCC)    Diabetes mellitus    Family history of adverse reaction to anesthesia    sister woke up during her eye  surgery.   GERD (gastroesophageal reflux disease)    Heart murmur    Hypercholesteremia    Hypertension    Hypothyroidism     Past Surgical History:  Procedure Laterality Date   BREAST CYST EXCISION Left    benign cyst   CHOLECYSTECTOMY N/A 01/17/2024   Procedure: LAPAROSCOPIC CHOLECYSTECTOMY WITH INTRAOPERATIVE CHOLANGIOGRAM;  Surgeon: Sheldon Standing, MD;  Location: [facility];  Service: General;  Laterality: N/A;   COLECTOMY WITH COLOSTOMY CREATION/HARTMANN PROCEDURE  03/01/2024   Procedure: COLECTOMY, WITH COLOSTOMY CREATION;  Surgeon: [name] Mays, MD;  Location: [facility];  Service: Gynecology;;   [name]  03/01/2024   Procedure: CYSTOSCOPY;  Surgeon: [name] Mays, MD;  Location: [facility];  Service: Gynecology;;   DILATION AND CURETTAGE OF UTERUS N/A 01/17/2024   Procedure: DILATION AND CURETTAGE;  Surgeon: [name] Mays, MD;  Location: [facility];  Service: Gynecology;  Laterality: N/A;   EYE SURGERY Left    cataract removal   HEMORRHOID SURGERY     HIP SURGERY     HYSTEROSCOPY WITH D & C N/A 01/06/2024   Procedure: DILATATION AND CURETTAGE /HYSTEROSCOPY;  Surgeon: Cleatus Moccasin, MD;  Location: [facility];  Service: Gynecology;  Laterality: N/A;  PAP SMEAR (DR TO BRING SUPPLIES)  TUCKERS CARD   HYSTEROSCOPY WITH D & C N/A 02/29/2024   Procedure: HYSTEROSCOPY WITH MYOSURE;  Surgeon: [name] Mays, MD;  Location: [facility];  Service: Gynecology;  Laterality: N/A;  myosure, possible ultrasound guidance   JOINT REPLACEMENT     hip replacement   LAPAROSCOPIC LYSIS OF ADHESIONS  01/06/2024   Procedure: LYSIS, ADHESIONS, LAPAROSCOPIC;  Surgeon: Cleatus Moccasin, MD;  Location: [facility];  Service: Gynecology;;   LAPAROSCOPY N/A 01/06/2024   Procedure: LAPAROSCOPY, DIAGNOSTIC;  Surgeon: Cleatus Moccasin, MD;  Location: [facility];  Service: Gynecology;  Laterality: N/A;   LAPAROSCOPY N/A 02/29/2024   Procedure: LAPAROSCOPY, DIAGNOSTIC;  Surgeon: [name] Mays, MD;  Location: [facility];  Service: Gynecology;   Laterality: N/A;   LAPAROTOMY N/A 03/01/2024   Procedure: LAPAROTOMY, EXPLORATORY;  Surgeon: [name] Mays, MD;  Location: [facility];  Service: Gynecology;  Laterality: N/A;  Possible Ileostomy   MOBILIZATION, SPLENIC FLEXURE, WITH PARTIAL COLECTOMY  03/01/2024   Procedure: MOBILIZATION, SPLENIC FLEXURE, WITH PARTIAL COLECTOMY;  Surgeon: [name] Mays, MD;  Location: [facility];  Service: Gynecology;;   OPERATIVE ULTRASOUND N/A 02/29/2024   Procedure: US  INTRAOPERATIVE;  Surgeon: [name] Mays, MD;  Location: [facility];  Service: Gynecology;  Laterality: N/A;   SIGMOIDOSCOPY N/A 02/29/2024   Procedure: SIGMOIDOSCOPY;  Surgeon: Debby Hila, MD;  Location: [facility];  Service: General;  Laterality: N/A;  Rigid sigmoidoscopy   SIGMOIDOSCOPY  03/01/2024   Procedure: [place];  Surgeon: Debby Hila, MD;  Location: [facility];  Service: General;;   THYROID  SURGERY     TONSILLECTOMY     TUBAL LIGATION      Allergies: Ace inhibitors, Tape, Aspirin, Codeine, and Merrem  [meropenem ]  Medications: Prior to Admission medications   Medication Sig Start Date End Date Taking? Authorizing Provider  bisacodyl  5 MG EC tablet Take 5 mg by mouth daily as needed for moderate constipation.    [provider]  buPROPion  (WELLBUTRIN  XL) 150 MG 24 hr tablet Take 150 mg by mouth every evening.    [provider]  Cholecalciferol  (VITAMIN D3) 25 MCG (1000 UT) CAPS Take 1,000 Units by mouth daily. Patient not taking: Reported on 04/26/2024    [provider]  ciprofloxacin  (CIPRO ) 500 MG tablet Take 1 tablet (500 mg total) by mouth 2 (two) times daily. 04/26/24   Fleeta Kathie Jomarie LOISE, MD  Cyanocobalamin  (VITAMIN B-12 PO) Take 1 tablet by mouth in the morning.    [provider]  diphenhydramine -acetaminophen  (TYLENOL  PM) 25-500 MG TABS tablet Take 2 tablets by mouth at bedtime as needed (Sleep/Pain).    [provider]  ezetimibe  (ZETIA ) 10 MG tablet Take 10 mg by mouth daily.  10/29/23 10/28/24  [provider]  ferrous sulfate 325 (65 FE) MG tablet Take 325 mg by mouth daily with breakfast. Patient not taking: Reported on 04/26/2024    [provider]  fluticasone  (FLONASE ) 50 MCG/ACT nasal spray Place 2 sprays into both nostrils daily as needed for rhinitis. 02/15/24   Cheryle Page, MD  hydrochlorothiazide  (HYDRODIURIL ) 25 MG tablet Take 1 tablet (25 mg total) by mouth daily. 01/10/24   Barbarann Nest, MD  levothyroxine  (SYNTHROID ) 75 MCG tablet Take 75 mcg by mouth daily before breakfast. 09/22/22   [provider]  lidocaine  (LIDODERM ) 5 % Place 1 patch onto the skin daily. Remove & Discard patch within 12 hours or as directed by MD. Apply to the right mid abdomen. 04/04/24   Cross, Melissa D, NP  losartan  (COZAAR ) 50 MG tablet Take 50 mg by mouth in the morning.    [provider]  magnesium  oxide (MAG-OX) 400 MG tablet Take 400 mg by mouth daily. 03/01/17   [provider]  methocarbamol  (ROBAXIN ) 500 MG tablet Take 1 tablet (500 mg total) by mouth 2 (two) times daily as needed for muscle spasms. 05/25/21   Joldersma, Logan, PA-C  metroNIDAZOLE  (FLAGYL ) 500 MG tablet Take 1 tablet (500 mg total) by mouth 2 (two) times daily. 04/26/24   Fleeta Kathie Jomarie LOISE, MD  mirtazapine  (REMERON ) 7.5 MG tablet Take 1 tablet (7.5 mg total) by mouth at bedtime. 04/04/24   Cross, Melissa D, NP  mometasone (ELOCON) 0.1 % cream Apply 1 Application topically daily as needed (Dermatitis- affected sites). 10/27/23   [provider]  Nutritional Supplements (FEEDING SUPPLEMENT, KATE FARMS STANDARD 1.4,) LIQD liquid Take 325 mLs by mouth 3 (three) times daily between meals. 04/04/24   Cross, Melissa D, NP  omeprazole (PRILOSEC) 40 MG capsule Take 40 mg by mouth daily before breakfast.    [provider]  ondansetron  (ZOFRAN ) 4 MG tablet Take 1 tablet (4 mg total) by mouth every 6 (six) hours as needed for nausea. 02/15/24   Cheryle Page, MD   oxyCODONE  10 MG TABS Take 0.5-1 tablets (5-10 mg total) by mouth every 6 (six) hours as needed for moderate pain (pain score 4-6) (5 mg for moderate pain, 10 mg for severe pain per Dr. [name]). 04/04/24   Cross, Melissa D, NP  polyethylene glycol (MIRALAX  / GLYCOLAX ) 17 g packet Take 17 g by mouth daily. Do not give if ostomy output loose Patient not taking: Reported on 04/26/2024 04/05/24   Cross, Melissa D, NP  sitaGLIPtin (JANUVIA) 50 MG tablet Take 50 mg by mouth daily. 10/28/22   [provider]  sodium chloride  0.9 % injection 5 mLs by Other route 3 (three) times daily. Flush JP drain (abscess drain) with 5 cc sterile saline three times daily 04/04/24   Cross, Melissa D, NP  triamcinolone ointment (KENALOG) 0.5 % Apply 1 Application topically 2 (two) times daily as needed (for itching).    [provider]  TYLENOL  8 HOUR ARTHRITIS PAIN 650 MG CR tablet Take 650-1,300 mg by mouth every 8 (eight) hours as needed for pain.    [provider]     Family History  Problem Relation Age of Onset   Breast cancer Neg Hx    Prostate cancer Neg Hx    Pancreatic cancer Neg Hx    Colon cancer Neg Hx    Endometrial cancer Neg Hx    Ovarian cancer Neg Hx     Social History   Socioeconomic History   Marital status: Single    Spouse name: Not on file   Number of children: Not on file   Years of education: Not on file   Highest education level: Not on file  Occupational History   Not on file  Tobacco Use   Smoking status: Former    Types: Cigarettes    Start date: 1967    Passive exposure: Never   Smokeless tobacco: Never  Vaping Use   Vaping status: Never Used  Substance and Sexual Activity   Alcohol use: No   Drug use: No   Sexual activity: Not Currently  Other Topics Concern   Not on file  Social History Narrative   Not on file   Social Drivers of Health   Financial Resource Strain: Not on file  Food Insecurity: No Food Insecurity (02/29/2024)   Hunger Vital  Sign    Worried About Running Out of Food in the Last Year: Never true    Ran Out of Food in the Last Year: Never true  Transportation Needs: No Transportation Needs (02/29/2024)   PRAPARE - Transportation    Lack of Transportation (Medical): No    Lack of  Transportation (Non-Medical): No  Physical Activity: Not on file  Stress: Not on file  Social Connections: Patient Declined (02/29/2024)   Social Connection and Isolation Panel    Frequency of Communication with Friends and Family: Patient declined    Frequency of Social Gatherings with Friends and Family: Patient declined    Attends Religious Services: Patient declined    Database administrator or Organizations: Patient declined    Attends Banker Meetings: Patient declined    Marital Status: Patient declined     Vital Signs: There were no vitals taken for this visit.  Physical Exam Constitutional:      General: She is not in acute distress.    Appearance: She is not ill-appearing.  Pulmonary:     Effort: Pulmonary effort is normal.  Abdominal:     Tenderness: There is no abdominal tenderness.     Comments: LLQ drain to suction. Approximately 50 ml of green/tan fluid in bulb.   Skin:    General: Skin is warm and dry.  Neurological:     Mental Status: She is alert and oriented to person, place, and time.     Imaging: No results found.  Labs:  CBC: Recent Labs    03/22/24 0812 03/24/24 0540 03/26/24 1313 03/30/24 0438  WBC 10.1 10.4 7.4 6.7  HGB 8.7* 8.9* 8.7* 7.9*  HCT 27.5* 27.1* 27.1* 24.9*  PLT 584* 605* 601* 452*    COAGS: No results for input(s): INR, APTT in the last 8760 hours.  BMP: Recent Labs    03/24/24 0540 03/25/24 1221 03/26/24 1313 03/30/24 0438  NA 130* 132* 132* 136  K 4.2 4.2 4.3 4.2  CL 96* 98 97* 101  CO2 25 24 25 24   GLUCOSE 96 174* 113* 109*  BUN 16 17 15 14   CALCIUM  9.1 8.9 8.9 9.4  CREATININE 0.92 0.97 1.09* 0.57  GFRNONAA >60 >60 53* >60    LIVER  FUNCTION TESTS: Recent Labs    03/17/24 0711 03/20/24 0525 03/26/24 1313 03/30/24 0438  BILITOT 0.5 0.6 0.3 0.4  AST 59* 44* 31 25  ALT 85* 68* 41 30  ALKPHOS 129* 100 88 67  PROT 6.4* 6.0* 7.0 6.7  ALBUMIN  1.8* 1.7* 2.1* 2.2*    Assessment:  78 year old female with a history of post-operative left paracolic gutter abscess s/p drain placement in IR 03/23/24.   CT imaging shows resolution of the fluid collection. Given the output in the past few weeks and known fistula the drain was not injected with contrast today. The drain has been in place since 7/24 and our team recommended the patient follow up with her Surgery team to discuss possible surgical intervention.   The drain was switched from bulb suction to a gravity bag. She will follow up with IR in 4 weeks for a repeat drain evaluation with possible drain injection and possible drain exchange/downsize. She was instructed to document the daily drain output. She will not flush the drain.   Signed: Warren JONELLE Dais, NP 05/03/2024, 11:14 AM   Please refer to Dr. Herminia attestation of this note for management and plan.

## 2024-05-03 ENCOUNTER — Other Ambulatory Visit: Payer: Self-pay | Admitting: Internal Medicine

## 2024-05-03 ENCOUNTER — Ambulatory Visit
Admission: RE | Admit: 2024-05-03 | Discharge: 2024-05-03 | Disposition: A | Discharge status: Home / Self Care | Source: Ambulatory Visit | Attending: Internal Medicine | Admitting: Internal Medicine

## 2024-05-03 ENCOUNTER — Ambulatory Visit
Admission: RE | Admit: 2024-05-03 | Discharge: 2024-05-03 | Disposition: A | Discharge status: Home / Self Care | Source: Ambulatory Visit | Attending: Radiology | Admitting: Radiology

## 2024-05-03 ENCOUNTER — Encounter: Payer: Self-pay | Admitting: Radiology

## 2024-05-03 ENCOUNTER — Ambulatory Visit
Admission: RE | Admit: 2024-05-03 | Discharge: 2024-05-03 | Disposition: A | Discharge status: Home / Self Care | Source: Ambulatory Visit | Attending: Internal Medicine

## 2024-05-03 DIAGNOSIS — K651 Peritoneal abscess: Secondary | ICD-10-CM

## 2024-05-03 HISTORY — PX: IR RADIOLOGIST EVAL & MGMT: IMG5224

## 2024-05-03 MED ORDER — IOPAMIDOL (ISOVUE-300) INJECTION 61%
100.0000 mL | Freq: Once | INTRAVENOUS | Status: AC | PRN
  Administered 2024-05-03: 100 mL via INTRAVENOUS

## 2024-05-04 ENCOUNTER — Telehealth: Payer: Self-pay | Admitting: *Deleted

## 2024-05-04 NOTE — Telephone Encounter (Signed)
 Attempted to reach patient to return voicemail left for office. LVM requesting call back.

## 2024-05-04 NOTE — Telephone Encounter (Signed)
 Spoke with Alexandria Durham who called the office. Patient and her sister Alexandria Durham are inquiring about her drain and next steps? Pt had an appt. With IR on 9/3 and her JP drain has been replaced with a bag drainage. Pt hasn't had to empty it because it doesn't have enough drainage. But states she will keep track of the drainage amount. Follow up with IR again on 9/26.   Pt continues to take her oral antibiotics from Dr. [name] with infectious disease. Patient will follow up again with them on 9/29.  Spoke with Alexandria Durham, patient's sister and states that Alexandria Durham is starting to eat more solid foods, and her consistency of her stool is mushy. Patient met with [name] on Friday, 8/29 at [facility] and ostomy bag was changed and replaced with a once piece system that hasn't been working better to prevent leakage.   Pt also states she called her PCP and is scheduling an appt. For today because she believes she has thrush according to home health nurse.   Advised patient that her message will be relayed to Dr. [name]. Pt thanked the office for calling back.

## 2024-05-06 ENCOUNTER — Other Ambulatory Visit (HOSPITAL_COMMUNITY): Payer: Self-pay | Admitting: Nurse Practitioner

## 2024-05-06 DIAGNOSIS — Z433 Encounter for attention to colostomy: Secondary | ICD-10-CM

## 2024-05-06 DIAGNOSIS — T8189XA Other complications of procedures, not elsewhere classified, initial encounter: Secondary | ICD-10-CM | POA: Insufficient documentation

## 2024-05-06 DIAGNOSIS — L24B3 Irritant contact dermatitis related to fecal or urinary stoma or fistula: Secondary | ICD-10-CM | POA: Insufficient documentation

## 2024-05-09 ENCOUNTER — Encounter: Payer: Self-pay | Admitting: *Deleted

## 2024-05-09 NOTE — Telephone Encounter (Signed)
 Spoke with patient this morning to relay that we have sent a referral to Dr. Bernarda Ned at [facility] and they will be calling to setup an appt. For you with Dr. Ned in the next week. Pt verbalized understanding and thanked the office and requested a MyChart Message with [facility] phone number. Pt had no further concerns at this time.

## 2024-05-22 NOTE — Progress Notes (Signed)
   PROVIDER:  BERNARDA WANDA NED, MD  MRN: [id number] DOB: Mar 06, 1946 DATE OF ENCOUNTER: 05/22/2024 Subjective   Chief Complaint:  NEW PROBLEM      History of Present Illness: Alexandria Durham is a 78 y.o. female who is seen today for discussion of colostomy reversal.  Patient has thickened endometrial tissue and underwent hysterectoscopy which was complicated by colon injury.  Postoperative course complicated by pericolonic abscess.  Since surgery, she has lost approximately 40 to 50 pounds.  She is not eating well.  She is having some difficulty with pouching the colostomy and keeping the pouch is on.  They are working with the ostomy clinic on this.   Objective:    Vitals:   05/22/24 1352  PainSc: 0-No pain      Abd - midline wound healing well, CT-guided drain pouch with purulence.   Labs, Imaging and Diagnostic Testing:  Most recent CT reviewed.  Assessment and Plan:  Diagnoses and all orders for this visit:  Severe protein-calorie malnutrition (CMS/HHS-HCC) -     megestroL (MEGACE) 20 MG tablet; Take 1 tablet (20 mg total) by mouth once daily     78 year old female status post bowel perforation and now with postoperative abscess.  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  I would not recommend a colostomy reversal at this time, as she will not be able to heal this.  I believe her fistula will most likely heal when she starts gaining weight.  We discussed using Megace to help work on her appetite.  We discussed using protein shakes to increase her calorie intake.  I will discuss her care further with Dr. [name].      BERNARDA JAYSON NED, MD Colon and Rectal Surgery [facility]

## 2024-05-25 ENCOUNTER — Other Ambulatory Visit: Payer: Self-pay | Admitting: Internal Medicine

## 2024-05-25 DIAGNOSIS — T8143XA Infection following a procedure, organ and space surgical site, initial encounter: Secondary | ICD-10-CM

## 2024-05-26 ENCOUNTER — Ambulatory Visit
Admission: RE | Admit: 2024-05-26 | Discharge: 2024-05-26 | Disposition: A | Discharge status: Home / Self Care | Source: Ambulatory Visit | Attending: Internal Medicine | Admitting: Internal Medicine

## 2024-05-26 ENCOUNTER — Other Ambulatory Visit: Payer: Self-pay | Admitting: Internal Medicine

## 2024-05-26 DIAGNOSIS — T8143XA Infection following a procedure, organ and space surgical site, initial encounter: Secondary | ICD-10-CM

## 2024-05-26 HISTORY — PX: IR CATHETER TUBE CHANGE: IMG717

## 2024-05-26 HISTORY — PX: IR RADIOLOGIST EVAL & MGMT: IMG5224

## 2024-05-26 MED ORDER — LIDOCAINE-EPINEPHRINE 1 %-1:100000 IJ SOLN
10.0000 mL | Freq: Once | INTRAMUSCULAR | Status: AC
  Administered 2024-05-26: 10 mL via INTRADERMAL

## 2024-05-26 MED ORDER — IOPAMIDOL (ISOVUE-300) INJECTION 61%
30.0000 mL | Freq: Once | INTRAVENOUS | Status: AC | PRN
  Administered 2024-05-26: 18 mL

## 2024-05-26 NOTE — Progress Notes (Signed)
 Referring Physician(s): Vu,Trung T  Chief Complaint: The patient is seen in follow up today s/p left paracolic gutter fluid collection s/p drain placement 03/23/24  History of present illness:  Alexandria Durham, 78 year old female, has a medical history significant for CKD, COPD, DM, HTN, post-menopausal bleeding/thickened endometrium, pancreatitis and acute cholecystitis. She is s/p laparoscopic cholecystectomy and D&C 12/2023. She was admitted to the hospital 02/29/24 for a hysteroscopy with repeat D&C. The procedure was complicated by perforation of the uterus, injury to the sigmoid colon and septic shock. On 03/01/24 she was taken back to the OR for exploratory laparotomy, descending end colostomy and partial colectomy. She had a surgically placed JP drain plus wound vac left in place post-procedure. Subsequent imaging 03/22/24 showed a fluid/gas collection along the left paracolic gutter. She underwent left lateral drain placement 03/23/24 and has been followed in the IR drain clinic for ongoing management of her left-sided drain.   04/19/24: Repeat CT Abd Pelv w/ contrast showed persistent fluid collection.  Drain injection showed a fistulous connection.  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   Patient returns for repeat drain injection and evaluation today.  She has been keeping her drain to gravity.  She does bring an output log with her today showing >30 mL/day on average.  She is accompanied by her daughter who assists with patient care as needed.  Past Medical History:  Diagnosis Date   Abscess of abdominal cavity (HCC) 04/25/2024   Arthritis    Chronic kidney disease    CKD 3a   COPD (chronic obstructive pulmonary disease) (HCC)    Diabetes mellitus    Family history of adverse reaction to anesthesia    sister woke up during her eye surgery.   GERD (gastroesophageal reflux disease)    Heart murmur     Hypercholesteremia    Hypertension    Hypothyroidism     Past Surgical History:  Procedure Laterality Date   BREAST CYST EXCISION Left    benign cyst   CHOLECYSTECTOMY N/A 01/17/2024   Procedure: LAPAROSCOPIC CHOLECYSTECTOMY WITH INTRAOPERATIVE CHOLANGIOGRAM;  Surgeon: Sheldon Standing, MD;  Location: [facility];  Service: General;  Laterality: N/A;   COLECTOMY WITH COLOSTOMY CREATION/HARTMANN PROCEDURE  03/01/2024   Procedure: COLECTOMY, WITH COLOSTOMY CREATION;  Surgeon: [name] Mays, MD;  Location: [facility];  Service: Gynecology;;   [name]  03/01/2024   Procedure: CYSTOSCOPY;  Surgeon: [name] Mays, MD;  Location: [facility];  Service: Gynecology;;   DILATION AND CURETTAGE OF UTERUS N/A 01/17/2024   Procedure: DILATION AND CURETTAGE;  Surgeon: [name] Mays, MD;  Location: [facility];  Service: Gynecology;  Laterality: N/A;   EYE SURGERY Left    cataract removal   HEMORRHOID SURGERY     HIP SURGERY     HYSTEROSCOPY WITH D & C N/A 01/06/2024   Procedure: DILATATION AND CURETTAGE /HYSTEROSCOPY;  Surgeon: Cleatus Moccasin, MD;  Location: [facility];  Service: Gynecology;  Laterality: N/A;  PAP SMEAR (DR TO BRING SUPPLIES)  TUCKERS CARD   HYSTEROSCOPY WITH D & C N/A 02/29/2024   Procedure: HYSTEROSCOPY WITH MYOSURE;  Surgeon: [name] Mays, MD;  Location: [facility];  Service: Gynecology;  Laterality: N/A;  myosure, possible ultrasound guidance   IR RADIOLOGIST EVAL & MGMT  05/03/2024   JOINT REPLACEMENT     hip replacement   LAPAROSCOPIC LYSIS OF ADHESIONS  01/06/2024   Procedure: LYSIS, ADHESIONS, LAPAROSCOPIC;  Surgeon: Cleatus Moccasin, MD;  Location: [facility];  Service: Gynecology;;   LAPAROSCOPY N/A 01/06/2024   Procedure: LAPAROSCOPY, DIAGNOSTIC;  Surgeon: Cleatus Moccasin, MD;  Location: [facility];  Service: Gynecology;  Laterality: N/A;   LAPAROSCOPY N/A 02/29/2024   Procedure: LAPAROSCOPY, DIAGNOSTIC;  Surgeon: [name] Mays, MD;  [facility]: [facility];  Service: Gynecology;  Laterality: N/A;    LAPAROTOMY N/A 03/01/2024   Procedure: LAPAROTOMY, EXPLORATORY;  Surgeon: [name] Mays, MD;  Location: [facility];  Service: Gynecology;  Laterality: N/A;  Possible Ileostomy   MOBILIZATION, SPLENIC FLEXURE, WITH PARTIAL COLECTOMY  03/01/2024   Procedure: MOBILIZATION, SPLENIC FLEXURE, WITH PARTIAL COLECTOMY;  Surgeon: [name] Mays, MD;  [facility]: [facility];  Service: Gynecology;;   OPERATIVE ULTRASOUND N/A 02/29/2024   Procedure: US  INTRAOPERATIVE;  Surgeon: [name] Mays, MD;  [facility]: [facility];  Service: Gynecology;  Laterality: N/A;   SIGMOIDOSCOPY N/A 02/29/2024   Procedure: SIGMOIDOSCOPY;  Surgeon: Debby Hila, MD;  Location: [facility];  Service: General;  Laterality: N/A;  Rigid sigmoidoscopy   SIGMOIDOSCOPY  03/01/2024   Procedure: KINGSTON;  Surgeon: Debby Hila, MD;  Location: [facility];  Service: General;;   THYROID  SURGERY     TONSILLECTOMY     TUBAL LIGATION      Allergies: Ace inhibitors, Tape, Aspirin, Codeine, and Merrem  [meropenem ]  Medications: Prior to Admission medications   Medication Sig Start Date End Date Taking? Authorizing Provider  bisacodyl  5 MG EC tablet Take 5 mg by mouth daily as needed for moderate constipation.    [provider]  buPROPion  (WELLBUTRIN  XL) 150 MG 24 hr tablet Take 150 mg by mouth every evening.    [provider]  Cholecalciferol  (VITAMIN D3) 25 MCG (1000 UT) CAPS Take 1,000 Units by mouth daily. Patient not taking: Reported on 04/26/2024    [provider]  ciprofloxacin  (CIPRO ) 500 MG tablet Take 1 tablet (500 mg total) by mouth 2 (two) times daily. 04/26/24   Fleeta Kathie Jomarie LOISE, MD  Cyanocobalamin  (VITAMIN B-12 PO) Take 1 tablet by mouth in the morning.    [provider]  diphenhydramine -acetaminophen  (TYLENOL  PM) 25-500 MG TABS tablet Take 2 tablets by mouth at bedtime as needed (Sleep/Pain).    [provider]  ezetimibe  (ZETIA ) 10 MG tablet Take 10 mg by mouth daily. 10/29/23 10/28/24   [provider]  ferrous sulfate 325 (65 FE) MG tablet Take 325 mg by mouth daily with breakfast. Patient not taking: Reported on 04/26/2024    [provider]  fluticasone  (FLONASE ) 50 MCG/ACT nasal spray Place 2 sprays into both nostrils daily as needed for rhinitis. 02/15/24   Cheryle Page, MD  hydrochlorothiazide  (HYDRODIURIL ) 25 MG tablet Take 1 tablet (25 mg total) by mouth daily. 01/10/24   Barbarann Nest, MD  levothyroxine  (SYNTHROID ) 75 MCG tablet Take 75 mcg by mouth daily before breakfast. 09/22/22   [provider]  lidocaine  (LIDODERM ) 5 % Place 1 patch onto the skin daily. Remove & Discard patch within 12 hours or as directed by MD. Apply to the right mid abdomen. 04/04/24   Cross, Melissa D, NP  losartan  (COZAAR ) 50 MG tablet Take 50 mg by mouth in the morning.    [provider]  magnesium  oxide (MAG-OX) 400 MG tablet Take 400 mg by mouth daily. 03/01/17   [provider]  methocarbamol  (ROBAXIN ) 500 MG tablet Take 1 tablet (500 mg total) by mouth 2 (two) times daily as needed for muscle spasms. 05/25/21   Joldersma, Logan, PA-C  metroNIDAZOLE  (FLAGYL ) 500 MG tablet  Take 1 tablet (500 mg total) by mouth 2 (two) times daily. 04/26/24   Fleeta Kathie Jomarie LOISE, MD  mirtazapine  (REMERON ) 7.5 MG tablet Take 1 tablet (7.5 mg total) by mouth at bedtime. 04/04/24   Cross, Melissa D, NP  mometasone (ELOCON) 0.1 % cream Apply 1 Application topically daily as needed (Dermatitis- affected sites). 10/27/23   [provider]  Nutritional Supplements (FEEDING SUPPLEMENT, KATE FARMS STANDARD 1.4,) LIQD liquid Take 325 mLs by mouth 3 (three) times daily between meals. 04/04/24   Cross, Melissa D, NP  omeprazole (PRILOSEC) 40 MG capsule Take 40 mg by mouth daily before breakfast.    [provider]  ondansetron  (ZOFRAN ) 4 MG tablet Take 1 tablet (4 mg total) by mouth every 6 (six) hours as needed for nausea. 02/15/24   Cheryle Page, MD  oxyCODONE  10 MG  TABS Take 0.5-1 tablets (5-10 mg total) by mouth every 6 (six) hours as needed for moderate pain (pain score 4-6) (5 mg for moderate pain, 10 mg for severe pain per Dr. [name]). 04/04/24   Cross, Melissa D, NP  polyethylene glycol (MIRALAX  / GLYCOLAX ) 17 g packet Take 17 g by mouth daily. Do not give if ostomy output loose Patient not taking: Reported on 04/26/2024 04/05/24   Cross, Melissa D, NP  sitaGLIPtin (JANUVIA) 50 MG tablet Take 50 mg by mouth daily. 10/28/22   [provider]  sodium chloride  0.9 % injection 5 mLs by Other route 3 (three) times daily. Flush JP drain (abscess drain) with 5 cc sterile saline three times daily 04/04/24   Cross, Melissa D, NP  triamcinolone ointment (KENALOG) 0.5 % Apply 1 Application topically 2 (two) times daily as needed (for itching).    [provider]  TYLENOL  8 HOUR ARTHRITIS PAIN 650 MG CR tablet Take 650-1,300 mg by mouth every 8 (eight) hours as needed for pain.    [provider]     Family History  Problem Relation Age of Onset   Breast cancer Neg Hx    Prostate cancer Neg Hx    Pancreatic cancer Neg Hx    Colon cancer Neg Hx    Endometrial cancer Neg Hx    Ovarian cancer Neg Hx     Social History   Socioeconomic History   Marital status: Single    Spouse name: Not on file   Number of children: Not on file   Years of education: Not on file   Highest education level: Not on file  Occupational History   Not on file  Tobacco Use   Smoking status: Former    Types: Cigarettes    Start date: 1967    Passive exposure: Never   Smokeless tobacco: Never  Vaping Use   Vaping status: Never Used  Substance and Sexual Activity   Alcohol use: No   Drug use: No   Sexual activity: Not Currently  Other Topics Concern   Not on file  Social History Narrative   Not on file   Social Drivers of Health   Financial Resource Strain: Not on file  Food Insecurity: No Food Insecurity (02/29/2024)   Hunger Vital Sign    Worried  About Running Out of Food in the Last Year: Never true    Ran Out of Food in the Last Year: Never true  Transportation Needs: No Transportation Needs (02/29/2024)   PRAPARE - Administrator, Civil Service (Medical): No    Lack of Transportation (Non-Medical): No  Physical  Activity: Not on file  Stress: Not on file  Social Connections: Patient Declined (02/29/2024)   Social Connection and Isolation Panel    Frequency of Communication with Friends and Family: Patient declined    Frequency of Social Gatherings with Friends and Family: Patient declined    Attends Religious Services: Patient declined    Database administrator or Organizations: Patient declined    Attends Banker Meetings: Patient declined    Marital Status: Patient declined     Vital Signs: There were no vitals taken for this visit.  Physical Exam NAD, alert Abdomen: soft, non-tender.  Left lateral drain in place. Thick adherent scab readily removed. Insertion site intact.  Drain to gravity with thick, beige output in gravity bag.   Imaging: No results found.  Labs:  CBC: Recent Labs    03/22/24 0812 03/24/24 0540 03/26/24 1313 03/30/24 0438  WBC 10.1 10.4 7.4 6.7  HGB 8.7* 8.9* 8.7* 7.9*  HCT 27.5* 27.1* 27.1* 24.9*  PLT 584* 605* 601* 452*    COAGS: No results for input(s): INR, APTT in the last 8760 hours.  BMP: Recent Labs    03/24/24 0540 03/25/24 1221 03/26/24 1313 03/30/24 0438  NA 130* 132* 132* 136  K 4.2 4.2 4.3 4.2  CL 96* 98 97* 101  CO2 25 24 25 24   GLUCOSE 96 174* 113* 109*  BUN 16 17 15 14   CALCIUM  9.1 8.9 8.9 9.4  CREATININE 0.92 0.97 1.09* 0.57  GFRNONAA >60 >60 53* >60    LIVER FUNCTION TESTS: Recent Labs    03/17/24 0711 03/20/24 0525 03/26/24 1313 03/30/24 0438  BILITOT 0.5 0.6 0.3 0.4  AST 59* 44* 31 25  ALT 85* 68* 41 30  ALKPHOS 129* 100 88 67  PROT 6.4* 6.0* 7.0 6.7  ALBUMIN  1.8* 1.7* 2.1* 2.2*    Assessment:  78 year old  female with a history of post-operative left paracolic gutter abscess s/p drain placement in IR 03/23/24.   Prior CT imaging shows resolution of the fluid collection. Repeat drain injection today show ongoing fistulous connection between the drain and bowel. Imaging and case reviewed by Dr. Jennefer who recommends proceeding with drain exchange as planned.  Will downsize drain to 8Fr mini-loop.  Drain exchange performed under fluoro without complication.  Patient tolerated procedure well.  EBL 0 mL.  Local anesthetic used.   Patient and daughter aware of plan and agreeable.  Continue to gravity drainage without flushes.  Reassess in 2 weeks.   Signed: Xin Klawitter Sue-Ellen Aianna Fahs, PA 05/26/2024, 2:34 PM    I spent a total of    25 Minutes in face to face in clinical consultation, greater than 50% of which was counseling/coordinating care for left paracolic gutter abscess.

## 2024-05-28 NOTE — Progress Notes (Unsigned)
 Subjective:  Chief complaint: follow-up for intrabdominal abscesses   Patient ID: Alexandria Durham, female    DOB: Jun 28, 1946, 78 y.o.   MRN: [id number]  HPI  Past Medical History:  Diagnosis Date   Abscess of abdominal cavity (HCC) 04/25/2024   Arthritis    Chronic kidney disease    CKD 3a   COPD (chronic obstructive pulmonary disease) (HCC)    Diabetes mellitus    Family history of adverse reaction to anesthesia    sister woke up during her eye surgery.   GERD (gastroesophageal reflux disease)    Heart murmur    Hypercholesteremia    Hypertension    Hypothyroidism     Past Surgical History:  Procedure Laterality Date   BREAST CYST EXCISION Left    benign cyst   CHOLECYSTECTOMY N/A 01/17/2024   Procedure: LAPAROSCOPIC CHOLECYSTECTOMY WITH INTRAOPERATIVE CHOLANGIOGRAM;  Surgeon: Sheldon Standing, MD;  Location: [facility];  Service: General;  Laterality: N/A;   COLECTOMY WITH COLOSTOMY CREATION/HARTMANN PROCEDURE  03/01/2024   Procedure: COLECTOMY, WITH COLOSTOMY CREATION;  Surgeon: [name] Mays, MD;  Location: [facility];  Service: Gynecology;;   [name]  03/01/2024   Procedure: CYSTOSCOPY;  Surgeon: [name] Mays, MD;  Location: [facility];  Service: Gynecology;;   DILATION AND CURETTAGE OF UTERUS N/A 01/17/2024   Procedure: DILATION AND CURETTAGE;  Surgeon: [name] Mays, MD;  Location: [facility];  Service: Gynecology;  Laterality: N/A;   EYE SURGERY Left    cataract removal   HEMORRHOID SURGERY     HIP SURGERY     HYSTEROSCOPY WITH D & C N/A 01/06/2024   Procedure: DILATATION AND CURETTAGE /HYSTEROSCOPY;  Surgeon: Cleatus Moccasin, MD;  Location: [facility];  Service: Gynecology;  Laterality: N/A;  PAP SMEAR (DR TO BRING SUPPLIES)  TUCKERS CARD   HYSTEROSCOPY WITH D & C N/A 02/29/2024   Procedure: HYSTEROSCOPY WITH MYOSURE;  Surgeon: [name] Mays, MD;  [facility]: [facility];  Service: Gynecology;  Laterality: N/A;  myosure, possible ultrasound guidance   IR RADIOLOGIST EVAL & MGMT   05/03/2024   JOINT REPLACEMENT     hip replacement   LAPAROSCOPIC LYSIS OF ADHESIONS  01/06/2024   Procedure: LYSIS, ADHESIONS, LAPAROSCOPIC;  Surgeon: Cleatus Moccasin, MD;  [facility]: [facility];  Service: Gynecology;;   LAPAROSCOPY N/A 01/06/2024   Procedure: LAPAROSCOPY, DIAGNOSTIC;  Surgeon: Cleatus Moccasin, MD;  Location: [facility];  Service: Gynecology;  Laterality: N/A;   LAPAROSCOPY N/A 02/29/2024   Procedure: LAPAROSCOPY, DIAGNOSTIC;  Surgeon: [name] Mays, MD;  [facility]: [facility];  Service: Gynecology;  Laterality: N/A;   LAPAROTOMY N/A 03/01/2024   Procedure: LAPAROTOMY, EXPLORATORY;  Surgeon: [name] Mays, MD;  [facility]: [facility];  Service: Gynecology;  Laterality: N/A;  Possible Ileostomy   MOBILIZATION, SPLENIC FLEXURE, WITH PARTIAL COLECTOMY  03/01/2024   Procedure: MOBILIZATION, SPLENIC FLEXURE, WITH PARTIAL COLECTOMY;  Surgeon: [name] Mays, MD;  [facility];  Service: Gynecology;;   OPERATIVE ULTRASOUND N/A 02/29/2024   Procedure: US  INTRAOPERATIVE;  Surgeon: [name] Mays, MD;  [facility];  Service: Gynecology;  Laterality: N/A;   SIGMOIDOSCOPY N/A 02/29/2024   Procedure: SIGMOIDOSCOPY;  Surgeon: Debby Hila, MD;  Location: [facility];  Service: General;  Laterality: N/A;  Rigid sigmoidoscopy   SIGMOIDOSCOPY  03/01/2024   Procedure: KINGSTON;  Surgeon: Debby Hila, MD;  Location: [facility];  Service: General;;   THYROID  SURGERY     TONSILLECTOMY     TUBAL LIGATION      Family History  Problem Relation Age of Onset  Breast cancer Neg Hx    Prostate cancer Neg Hx    Pancreatic cancer Neg Hx    Colon cancer Neg Hx    Endometrial cancer Neg Hx    Ovarian cancer Neg Hx       Social History   Socioeconomic History   Marital status: Single    Spouse name: Not on file   Number of children: Not on file   Years of education: Not on file   Highest education level: Not on file  Occupational History   Not on file  Tobacco Use   Smoking status: Former    Types:  Cigarettes    Start date: 1967    Passive exposure: Never   Smokeless tobacco: Never  Vaping Use   Vaping status: Never Used  Substance and Sexual Activity   Alcohol use: No   Drug use: No   Sexual activity: Not Currently  Other Topics Concern   Not on file  Social History Narrative   Not on file   Social Drivers of Health   Financial Resource Strain: Not on file  Food Insecurity: No Food Insecurity (02/29/2024)   Hunger Vital Sign    Worried About Running Out of Food in the Last Year: Never true    Ran Out of Food in the Last Year: Never true  Transportation Needs: No Transportation Needs (02/29/2024)   PRAPARE - Administrator, Civil Service (Medical): No    Lack of Transportation (Non-Medical): No  Physical Activity: Not on file  Stress: Not on file  Social Connections: Patient Declined (02/29/2024)   Social Connection and Isolation Panel    Frequency of Communication with Friends and Family: Patient declined    Frequency of Social Gatherings with Friends and Family: Patient declined    Attends Religious Services: Patient declined    Database administrator or Organizations: Patient declined    Attends Banker Meetings: Patient declined    Marital Status: Patient declined    Allergies  Allergen Reactions   Ace Inhibitors Hives and Itching   Tape Other (See Comments)    Skin is sensitive   Aspirin Palpitations   Codeine Palpitations   Merrem  [Meropenem ] Rash     Current Outpatient Medications:    bisacodyl  5 MG EC tablet, Take 5 mg by mouth daily as needed for moderate constipation., Disp: , Rfl:    buPROPion  (WELLBUTRIN  XL) 150 MG 24 hr tablet, Take 150 mg by mouth every evening., Disp: , Rfl:    Cholecalciferol  (VITAMIN D3) 25 MCG (1000 UT) CAPS, Take 1,000 Units by mouth daily. (Patient not taking: Reported on 04/26/2024), Disp: , Rfl:    ciprofloxacin  (CIPRO ) 500 MG tablet, Take 1 tablet (500 mg total) by mouth 2 (two) times daily., Disp: 60  tablet, Rfl: 1   Cyanocobalamin  (VITAMIN B-12 PO), Take 1 tablet by mouth in the morning., Disp: , Rfl:    diphenhydramine -acetaminophen  (TYLENOL  PM) 25-500 MG TABS tablet, Take 2 tablets by mouth at bedtime as needed (Sleep/Pain)., Disp: , Rfl:    ezetimibe  (ZETIA ) 10 MG tablet, Take 10 mg by mouth daily., Disp: , Rfl:    ferrous sulfate 325 (65 FE) MG tablet, Take 325 mg by mouth daily with breakfast. (Patient not taking: Reported on 04/26/2024), Disp: , Rfl:    fluticasone  (FLONASE ) 50 MCG/ACT nasal spray, Place 2 sprays into both nostrils daily as needed for rhinitis., Disp: , Rfl:    hydrochlorothiazide  (HYDRODIURIL ) 25 MG tablet, Take 1  tablet (25 mg total) by mouth daily., Disp: 30 tablet, Rfl: 0   levothyroxine  (SYNTHROID ) 75 MCG tablet, Take 75 mcg by mouth daily before breakfast., Disp: , Rfl:    lidocaine  (LIDODERM ) 5 %, Place 1 patch onto the skin daily. Remove & Discard patch within 12 hours or as directed by MD. Apply to the right mid abdomen., Disp: 30 patch, Rfl: 1   losartan  (COZAAR ) 50 MG tablet, Take 50 mg by mouth in the morning., Disp: , Rfl:    magnesium  oxide (MAG-OX) 400 MG tablet, Take 400 mg by mouth daily., Disp: , Rfl:    methocarbamol  (ROBAXIN ) 500 MG tablet, Take 1 tablet (500 mg total) by mouth 2 (two) times daily as needed for muscle spasms., Disp: 14 tablet, Rfl: 0   metroNIDAZOLE  (FLAGYL ) 500 MG tablet, Take 1 tablet (500 mg total) by mouth 2 (two) times daily., Disp: 60 tablet, Rfl: 1   mirtazapine  (REMERON ) 7.5 MG tablet, Take 1 tablet (7.5 mg total) by mouth at bedtime., Disp: 30 tablet, Rfl: 3   mometasone (ELOCON) 0.1 % cream, Apply 1 Application topically daily as needed (Dermatitis- affected sites)., Disp: , Rfl:    Nutritional Supplements (FEEDING SUPPLEMENT, KATE FARMS STANDARD 1.4,) LIQD liquid, Take 325 mLs by mouth 3 (three) times daily between meals., Disp: 325 mL, Rfl: 3   omeprazole (PRILOSEC) 40 MG capsule, Take 40 mg by mouth daily before breakfast.,  Disp: , Rfl:    ondansetron  (ZOFRAN ) 4 MG tablet, Take 1 tablet (4 mg total) by mouth every 6 (six) hours as needed for nausea., Disp: 20 tablet, Rfl: 0   oxyCODONE  10 MG TABS, Take 0.5-1 tablets (5-10 mg total) by mouth every 6 (six) hours as needed for moderate pain (pain score 4-6) (5 mg for moderate pain, 10 mg for severe pain per Dr. [name])., Disp: 30 tablet, Rfl: 0   polyethylene glycol (MIRALAX  / GLYCOLAX ) 17 g packet, Take 17 g by mouth daily. Do not give if ostomy output loose (Patient not taking: Reported on 04/26/2024), Disp: 14 each, Rfl: 0   sitaGLIPtin (JANUVIA) 50 MG tablet, Take 50 mg by mouth daily., Disp: , Rfl:    sodium chloride  0.9 % injection, 5 mLs by Other route 3 (three) times daily. Flush JP drain (abscess drain) with 5 cc sterile saline three times daily, Disp: 5 mL, Rfl: 60   triamcinolone ointment (KENALOG) 0.5 %, Apply 1 Application topically 2 (two) times daily as needed (for itching)., Disp: , Rfl:    TYLENOL  8 HOUR ARTHRITIS PAIN 650 MG CR tablet, Take 650-1,300 mg by mouth every 8 (eight) hours as needed for pain., Disp: , Rfl:    Review of Systems     Objective:   Physical Exam        Assessment & Plan:

## 2024-05-29 ENCOUNTER — Ambulatory Visit (INDEPENDENT_AMBULATORY_CARE_PROVIDER_SITE_OTHER): Payer: Self-pay | Admitting: Infectious Disease

## 2024-05-29 ENCOUNTER — Other Ambulatory Visit: Payer: Self-pay

## 2024-05-29 VITALS — BP 89/52 | HR 105 | Temp 98.0°F

## 2024-05-29 DIAGNOSIS — N719 Inflammatory disease of uterus, unspecified: Secondary | ICD-10-CM

## 2024-05-29 DIAGNOSIS — K5792 Diverticulitis of intestine, part unspecified, without perforation or abscess without bleeding: Secondary | ICD-10-CM | POA: Diagnosis not present

## 2024-05-29 DIAGNOSIS — K632 Fistula of intestine: Secondary | ICD-10-CM

## 2024-05-29 DIAGNOSIS — K859 Acute pancreatitis without necrosis or infection, unspecified: Secondary | ICD-10-CM | POA: Diagnosis not present

## 2024-05-29 DIAGNOSIS — K651 Peritoneal abscess: Secondary | ICD-10-CM

## 2024-05-29 DIAGNOSIS — B370 Candidal stomatitis: Secondary | ICD-10-CM

## 2024-05-29 MED ORDER — FLUCONAZOLE 100 MG PO TABS: 100.0000 mg | ORAL_TABLET | Freq: Every day | ORAL | 0 refills | Status: AC

## 2024-06-02 ENCOUNTER — Other Ambulatory Visit: Payer: Self-pay

## 2024-06-02 ENCOUNTER — Telehealth: Payer: Self-pay

## 2024-06-02 DIAGNOSIS — T8143XA Infection following a procedure, organ and space surgical site, initial encounter: Secondary | ICD-10-CM

## 2024-06-02 NOTE — Progress Notes (Signed)
 Time approximately 1520  Pt's niece, Dawna called [facility] with concerns that JP drain had come out, noted when changing colostomy bag, niece stated that they were able to see the end of the tube opposite of the bulb, this RN spoke with Dr. Johann and returned call at approximately 1605, spoke with Dawna once again, per Dr. Delinda instruction, Call [facility] IR @ [phone]  and if no answer call on-call IR, #, [phone],to have drain replaced (most likely), also informed that they can remove suture, be sure to take all of it out of skin and place gauze over site or tape all of drain up, so it does not pull on pt's skin, verbal acknowledgement noted

## 2024-06-02 NOTE — Telephone Encounter (Signed)
 Patient family called IR provider line, shared that patient's drain was completely removed on accident and that pigtail of end of catheter is outside the body.  She suspects the drain has been out for 24 hours at this time.  Left abdominal drain was placed in July of this year originally after patient experienced perforated uterus and subsequent exploratory laparotomy, descending end colostomy and partial colectomy, and postop fluid collection. She has been following up with Dr. [name] (surgery), ID, IR drain clinic.  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    Case discussed with Dr. Philip with the following action plan made: 1.  Patient will be scheduled (no schedulers available at this time of day to confirm exact time, goal will be for Monday) for drain reinsertion attempt under fluoroscopy through existing tract. 2.  If drain cannot be reintroduced through the existing tract, recommendation will be made for follow-up CT in approximately 2 weeks or as indicated by symptoms by her general surgery team. 3.  If she develops symptoms such as fever, chills, new abdominal pain, nausea, vomiting, copious output from drain tract, patient is counseled to present to the ER over the weekend.  Family and patient understand that at this time there is unlikely a target fluid collection for new puncture.  Alexandria Mcgovern NP 06/02/2024 5:01 PM

## 2024-06-05 ENCOUNTER — Ambulatory Visit (HOSPITAL_COMMUNITY)
Admission: RE | Admit: 2024-06-05 | Discharge: 2024-06-05 | Disposition: A | Discharge status: Home / Self Care | Source: Ambulatory Visit

## 2024-06-05 ENCOUNTER — Other Ambulatory Visit: Payer: Self-pay

## 2024-06-05 DIAGNOSIS — Y839 Surgical procedure, unspecified as the cause of abnormal reaction of the patient, or of later complication, without mention of misadventure at the time of the procedure: Secondary | ICD-10-CM | POA: Diagnosis not present

## 2024-06-05 DIAGNOSIS — Z4803 Encounter for change or removal of drains: Secondary | ICD-10-CM | POA: Insufficient documentation

## 2024-06-05 DIAGNOSIS — T8143XA Infection following a procedure, organ and space surgical site, initial encounter: Secondary | ICD-10-CM | POA: Diagnosis not present

## 2024-06-05 DIAGNOSIS — K5780 Diverticulitis of intestine, part unspecified, with perforation and abscess without bleeding: Secondary | ICD-10-CM | POA: Diagnosis not present

## 2024-06-05 DIAGNOSIS — K651 Peritoneal abscess: Secondary | ICD-10-CM | POA: Insufficient documentation

## 2024-06-05 HISTORY — PX: IR CATHETER TUBE CHANGE: IMG717

## 2024-06-05 MED ORDER — LIDOCAINE HCL (PF) 1 % IJ SOLN
INTRAMUSCULAR | Status: AC
  Filled 2024-06-05: qty 30

## 2024-06-05 MED ORDER — LIDOCAINE VISCOUS HCL 2 % MT SOLN
OROMUCOSAL | Status: AC
  Filled 2024-06-05: qty 15

## 2024-06-05 NOTE — Procedures (Signed)
 Interventional Radiology Procedure Note  Procedure: attempt at LLQ DRAIN REPLACEMENT(UNSUCCESSFUL)    Complications: None  Estimated Blood Loss:  0  Findings: PERCUTANEOUS TRACT CLOSED.  UNABLE TO REACCESS FOR DRAIN REPLACMENT D/W WITH PATIENT AND HER SISTER FOR OUTPT SURGERY F/U    EMERSON FREDERIC SPECKING, MD

## 2024-06-06 ENCOUNTER — Telehealth: Payer: Self-pay | Admitting: *Deleted

## 2024-06-06 NOTE — Telephone Encounter (Signed)
 Spoke with [name] Clause, patient's sister and relayed message from [name], NP that we will touch base with Dr. [name] on Monday when she returns to see if she wants repeat imaging. Doris verbalized understanding and thanked the office for calling.

## 2024-06-06 NOTE — Telephone Encounter (Signed)
 Spoke with patient and her sister [name].  They state patient's JP drain fell out on Friday? 10/3. They reached out to IR and patient was scheduled for re-placement yesterday on 10/6. [name] According to patient, they were unable to replace JP drain.   Per Dr. Thedford note, they attempted drain placement but were unsuccessful due to percutaneous tract closed. Patient was told to reach out to Dr. Lyda office. Advised patient that her message will be relayed to providers and the office would call back with recommendations.

## 2024-06-07 NOTE — Telephone Encounter (Signed)
 Spoke w/pt stating had to go to ED in May where she had to have colon surgery (notes in computer), but in the past week both eyes have been itchy & the eyelids achy feeling started Pataday yesterday seemed to help some  -Denies any actual eye pain, no eye redness, no photophobia, no missing/loss in vision & no curtain/veil over vision at this time  -Advise pt continue Pataday daily or as needed for allergies, start lid scrub (baby shampoo) twice daily & start tears (name brand only) 3 times daily & can start warm compresses through out the day or as needed for comfort  -Stressed to pt if things gets worse or new symptoms come up let us  know ASAP -Pt expressed understanding

## 2024-06-12 ENCOUNTER — Other Ambulatory Visit

## 2024-06-14 ENCOUNTER — Telehealth: Payer: Self-pay

## 2024-06-14 NOTE — Telephone Encounter (Signed)
 Per Dr.Newton, I reached out to Alexandria Durham and also her sister Alexandria Durham, regarding a follow up appointment with Dr.Newton.   Pt is scheduled for 11/10 @ 3:15. Pt agreed to date/time. I will also make her sister, Alexandria, aware (per pt request)

## 2024-06-14 NOTE — Telephone Encounter (Signed)
-----   Message from [place], [place] sent at 06/13/2024  6:03 PM EDT ----- Regarding: RE: follow-up Thanks for asking, but no, wouldn't want to overbook for this. May go long. Can wait longer to see her. ----- Message ----- From: [place], Keelee Yankey, CMA Sent: 06/13/2024   3:48 PM EDT To: Hoy Masters, MD Subject: RE: follow-up                                  There is an opening at 12:45 on 10/20, would that be ok? ----- Message ----- From: Masters Hoy, MD Sent: 06/13/2024   3:23 PM EDT To: [facility] Subject: follow-up                                      Can we get her scheduled for a follow-up, next available. We can make a plan at that time of what to do regarding her uterus, the drain coming out.   Thanks, [employer]

## 2024-06-14 NOTE — Telephone Encounter (Signed)
 Spoke with patient's sister [name] Clause and relayed message that Ms. Bently has been scheduled for a follow up with Dr. [name] on Monday, November 10 th. At 3:15 pm with an arrival time of 3:00 for check in. Doris verbalized understanding, agreed to date and time of appt. And thanked the office for calling.

## 2024-06-16 ENCOUNTER — Ambulatory Visit
Admission: RE | Admit: 2024-06-16 | Discharge: 2024-06-16 | Disposition: A | Source: Ambulatory Visit | Attending: Internal Medicine

## 2024-06-16 DIAGNOSIS — K651 Peritoneal abscess: Secondary | ICD-10-CM

## 2024-06-16 HISTORY — PX: IR RADIOLOGIST EVAL & MGMT: IMG5224

## 2024-06-16 NOTE — Progress Notes (Signed)
 Referring Physician(s): Vu,Trung T  Supervising Physician: Jennefer Rover  Chief Complaint: The patient is seen in follow up today s/p left paracolic gutter fluid collection drain placement 03/23/24   History of present illness:  Alexandria Durham, 78 year old female, has a medical history significant for CKD, COPD, DM, HTN, post-menopausal bleeding/thickened endometrium, pancreatitis and acute cholecystitis. She is s/p laparoscopic cholecystectomy and D&C 12/2023. She was admitted to the hospital 02/29/24 for a hysteroscopy with repeat D&C. The procedure was complicated by perforation of the uterus, injury to the sigmoid colon and septic shock. On 03/01/24 she was taken back to the OR for exploratory laparotomy, descending end colostomy and partial colectomy. She had a surgically placed JP drain plus wound vac left in place post-procedure. Subsequent imaging 03/22/24 showed a fluid/gas collection along the left paracolic gutter. She underwent left lateral drain placement 03/23/24 and has been followed in the IR drain clinic for ongoing management of her left-sided drain.    04/19/24: Repeat CT Abd Pelv w/ contrast showed persistent fluid collection.  Drain injection showed a fistulous connection.  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   She followed up with IR 05/26/24 and reported a daily output of >30 ml/day. Repeat drain injection that day showed ongoing fistulous connection between the drain and bowel. Our team recommended decreasing the size of the drain and the patient was agreeable. The drain was exchanged/downsized to an 8Fr mini-loop.  She was tentatively scheduled to follow up with us  in 2 weeks however she called IR 06/02/24 stating the drain had accidentally been pulled completely out. She was scheduled for an attempted drain replacement and was seen at the [facility] [place] IR 06/05/24. Dr. Vanice attempted to replaced the  drain but the tract had closed. She was advised to follow up with the surgical team. She contacted the surgery team and was told that new imaging would be ordered if the patient becomes symptomatic.  The patient arrived to the IR outpatient clinic today for her previously scheduled appointment. She is accompanied by her daughter and with the exception of a low appetite she is feeling well. She denies significant abdominal pain, nausea, fevers, etc. She endorses a low, generalized abdominal discomfort.    Past Medical History:  Diagnosis Date   Abscess of abdominal cavity (HCC) 04/25/2024   Arthritis    Chronic kidney disease    CKD 3a   COPD (chronic obstructive pulmonary disease) (HCC)    Diabetes mellitus    Family history of adverse reaction to anesthesia    sister woke up during her eye surgery.   GERD (gastroesophageal reflux disease)    Heart murmur    Hypercholesteremia    Hypertension    Hypothyroidism     Past Surgical History:  Procedure Laterality Date   BREAST CYST EXCISION Left    benign cyst   CHOLECYSTECTOMY N/A 01/17/2024   Procedure: LAPAROSCOPIC CHOLECYSTECTOMY WITH INTRAOPERATIVE CHOLANGIOGRAM;  Surgeon: Sheldon Standing, MD;  Location: [facility];  Service: General;  Laterality: N/A;   COLECTOMY WITH COLOSTOMY CREATION/HARTMANN PROCEDURE  03/01/2024   Procedure: COLECTOMY, WITH COLOSTOMY CREATION;  Surgeon: [name] Mays, MD;  Location: [facility];  Service: Gynecology;;   [name]  03/01/2024   Procedure: CYSTOSCOPY;  Surgeon: [name] Mays, MD;  Location: [facility];  Service: Gynecology;;   DILATION AND CURETTAGE OF UTERUS N/A 01/17/2024   Procedure: DILATION AND CURETTAGE;  Surgeon: [name] Mays, MD;  Location: [facility];  Service: Gynecology;  Laterality: N/A;   EYE SURGERY Left    cataract removal   HEMORRHOID SURGERY     HIP SURGERY     HYSTEROSCOPY WITH D & C N/A 01/06/2024   Procedure: DILATATION AND CURETTAGE /HYSTEROSCOPY;  Surgeon: Cleatus Moccasin, MD;   Location: [facility];  Service: Gynecology;  Laterality: N/A;  PAP SMEAR (DR TO BRING SUPPLIES)  TUCKERS CARD   HYSTEROSCOPY WITH D & C N/A 02/29/2024   Procedure: HYSTEROSCOPY WITH MYOSURE;  Surgeon: [name] Mays, MD;  Location: [facility];  Service: Gynecology;  Laterality: N/A;  myosure, possible ultrasound guidance   IR CATHETER TUBE CHANGE  05/26/2024   IR CATHETER TUBE CHANGE  06/05/2024   IR RADIOLOGIST EVAL & MGMT  05/03/2024   IR RADIOLOGIST EVAL & MGMT  05/26/2024   IR RADIOLOGIST EVAL & MGMT  04/19/2024   JOINT REPLACEMENT     hip replacement   LAPAROSCOPIC LYSIS OF ADHESIONS  01/06/2024   Procedure: LYSIS, ADHESIONS, LAPAROSCOPIC;  Surgeon: Cleatus Moccasin, MD;  Location: [facility];  Service: Gynecology;;   LAPAROSCOPY N/A 01/06/2024   Procedure: LAPAROSCOPY, DIAGNOSTIC;  Surgeon: Cleatus Moccasin, MD;  Location: [facility];  Service: Gynecology;  Laterality: N/A;   LAPAROSCOPY N/A 02/29/2024   Procedure: LAPAROSCOPY, DIAGNOSTIC;  Surgeon: [name] Mays, MD;  Location: [facility];  Service: Gynecology;  Laterality: N/A;   LAPAROTOMY N/A 03/01/2024   Procedure: LAPAROTOMY, EXPLORATORY;  Surgeon: [name] Mays, MD;  Location: [facility];  Service: Gynecology;  Laterality: N/A;  Possible Ileostomy   MOBILIZATION, SPLENIC FLEXURE, WITH PARTIAL COLECTOMY  03/01/2024   Procedure: MOBILIZATION, SPLENIC FLEXURE, WITH PARTIAL COLECTOMY;  Surgeon: [name] Mays, MD;  Location: [facility];  Service: Gynecology;;   OPERATIVE ULTRASOUND N/A 02/29/2024   Procedure: US  INTRAOPERATIVE;  Surgeon: [name] Mays, MD;  Location: [facility];  Service: Gynecology;  Laterality: N/A;   SIGMOIDOSCOPY N/A 02/29/2024   Procedure: SIGMOIDOSCOPY;  Surgeon: Debby Hila, MD;  Location: [facility];  Service: General;  Laterality: N/A;  Rigid sigmoidoscopy   SIGMOIDOSCOPY  03/01/2024   Procedure: [place];  Surgeon: Debby Hila, MD;  Location: [facility];  Service: General;;   THYROID  SURGERY     TONSILLECTOMY     TUBAL LIGATION       Allergies: Ace inhibitors, Tape, Aspirin, Codeine, and Merrem  [meropenem ]  Medications: Prior to Admission medications   Medication Sig Start Date End Date Taking? Authorizing Provider  bisacodyl  5 MG EC tablet Take 5 mg by mouth daily as needed for moderate constipation.    [provider]  buPROPion  (WELLBUTRIN  XL) 150 MG 24 hr tablet Take 150 mg by mouth every evening.    [provider]  Cholecalciferol  (VITAMIN D3) 25 MCG (1000 UT) CAPS Take 1,000 Units by mouth daily. Patient not taking: Reported on 04/26/2024    [provider]  ciprofloxacin  (CIPRO ) 500 MG tablet Take 1 tablet (500 mg total) by mouth 2 (two) times daily. 04/26/24   Fleeta Kathie Jomarie LOISE, MD  Cyanocobalamin  (VITAMIN B-12 PO) Take 1 tablet by mouth in the morning.    [provider]  diphenhydramine -acetaminophen  (TYLENOL  PM) 25-500 MG TABS tablet Take 2 tablets by mouth at bedtime as needed (Sleep/Pain).    [provider]  ezetimibe  (ZETIA ) 10 MG tablet Take 10 mg by mouth daily. 10/29/23 10/28/24  [provider]  ferrous sulfate 325 (65 FE) MG tablet Take 325 mg by mouth daily with breakfast. Patient not taking: Reported on 04/26/2024    [provider]  fluconazole  (DIFLUCAN ) 100 MG tablet Take 1 tablet (100 mg total) by mouth daily. 05/29/24   Fleeta Kathie Jomarie LOISE, MD  fluticasone  (FLONASE ) 50 MCG/ACT nasal spray Place 2 sprays into both nostrils daily as needed for rhinitis. 02/15/24   Cheryle Page, MD  hydrochlorothiazide  (HYDRODIURIL ) 25 MG tablet Take 1 tablet (25 mg total) by mouth daily. 01/10/24   Barbarann Nest, MD  levothyroxine  (SYNTHROID ) 75 MCG tablet Take 75 mcg by mouth daily before breakfast. 09/22/22   [provider]  lidocaine  (LIDODERM ) 5 % Place 1 patch onto the skin daily. Remove & Discard patch within 12 hours or as directed by MD. Apply to the right mid abdomen. 04/04/24   Cross, Melissa D, NP  losartan  (COZAAR ) 50 MG tablet  Take 50 mg by mouth in the morning.    [provider]  magnesium  oxide (MAG-OX) 400 MG tablet Take 400 mg by mouth daily. 03/01/17   [provider]  methocarbamol  (ROBAXIN ) 500 MG tablet Take 1 tablet (500 mg total) by mouth 2 (two) times daily as needed for muscle spasms. 05/25/21   Joldersma, Logan, PA-C  metroNIDAZOLE  (FLAGYL ) 500 MG tablet Take 1 tablet (500 mg total) by mouth 2 (two) times daily. 04/26/24   Fleeta Kathie Jomarie LOISE, MD  mirtazapine  (REMERON ) 7.5 MG tablet Take 1 tablet (7.5 mg total) by mouth at bedtime. 04/04/24   Cross, Melissa D, NP  mometasone (ELOCON) 0.1 % cream Apply 1 Application topically daily as needed (Dermatitis- affected sites). 10/27/23   [provider]  Nutritional Supplements (FEEDING SUPPLEMENT, KATE FARMS STANDARD 1.4,) LIQD liquid Take 325 mLs by mouth 3 (three) times daily between meals. 04/04/24   Cross, Melissa D, NP  omeprazole (PRILOSEC) 40 MG capsule Take 40 mg by mouth daily before breakfast.    [provider]  ondansetron  (ZOFRAN ) 4 MG tablet Take 1 tablet (4 mg total) by mouth every 6 (six) hours as needed for nausea. 02/15/24   Cheryle Page, MD  oxyCODONE  10 MG TABS Take 0.5-1 tablets (5-10 mg total) by mouth every 6 (six) hours as needed for moderate pain (pain score 4-6) (5 mg for moderate pain, 10 mg for severe pain per Dr. [name]). 04/04/24   Cross, Melissa D, NP  polyethylene glycol (MIRALAX  / GLYCOLAX ) 17 g packet Take 17 g by mouth daily. Do not give if ostomy output loose Patient not taking: Reported on 04/26/2024 04/05/24   Cross, Melissa D, NP  sitaGLIPtin (JANUVIA) 50 MG tablet Take 50 mg by mouth daily. 10/28/22   [provider]  sodium chloride  0.9 % injection 5 mLs by Other route 3 (three) times daily. Flush JP drain (abscess drain) with 5 cc sterile saline three times daily 04/04/24   Cross, Melissa D, NP  triamcinolone ointment (KENALOG) 0.5 % Apply 1 Application topically 2 (two) times daily as needed  (for itching).    [provider]  TYLENOL  8 HOUR ARTHRITIS PAIN 650 MG CR tablet Take 650-1,300 mg by mouth every 8 (eight) hours as needed for pain.    [provider]     Family History  Problem Relation Age of Onset   Breast cancer Neg Hx    Prostate cancer Neg Hx    Pancreatic cancer Neg Hx    Colon cancer Neg Hx    Endometrial cancer Neg Hx    Ovarian cancer Neg Hx     Social History   Socioeconomic History   Marital status: Single    Spouse  name: Not on file   Number of children: Not on file   Years of education: Not on file   Highest education level: Not on file  Occupational History   Not on file  Tobacco Use   Smoking status: Former    Types: Cigarettes    Start date: 1967    Passive exposure: Never   Smokeless tobacco: Never  Vaping Use   Vaping status: Never Used  Substance and Sexual Activity   Alcohol use: No   Drug use: No   Sexual activity: Not Currently  Other Topics Concern   Not on file  Social History Narrative   Not on file   Social Drivers of Health   Financial Resource Strain: Not on file  Food Insecurity: No Food Insecurity (02/29/2024)   Hunger Vital Sign    Worried About Running Out of Food in the Last Year: Never true    Ran Out of Food in the Last Year: Never true  Transportation Needs: No Transportation Needs (02/29/2024)   PRAPARE - Administrator, Civil Service (Medical): No    Lack of Transportation (Non-Medical): No  Physical Activity: Not on file  Stress: Not on file  Social Connections: Patient Declined (02/29/2024)   Social Connection and Isolation Panel    Frequency of Communication with Friends and Family: Patient declined    Frequency of Social Gatherings with Friends and Family: Patient declined    Attends Religious Services: Patient declined    Database administrator or Organizations: Patient declined    Attends Banker Meetings: Patient declined    Marital Status: Patient  declined     Vital Signs: There were no vitals taken for this visit.  Physical Exam Constitutional:      General: She is not in acute distress.    Appearance: She is not ill-appearing.  Pulmonary:     Effort: Pulmonary effort is normal.  Abdominal:     Comments: Left abdominal colostomy. Site of prior LLQ drain is unremarkable. The site is completely closed and healed.   Skin:    General: Skin is warm and dry.  Neurological:     Mental Status: She is alert and oriented to person, place, and time.     Imaging: No results found.  Labs:  CBC: Recent Labs    03/22/24 0812 03/24/24 0540 03/26/24 1313 03/30/24 0438  WBC 10.1 10.4 7.4 6.7  HGB 8.7* 8.9* 8.7* 7.9*  HCT 27.5* 27.1* 27.1* 24.9*  PLT 584* 605* 601* 452*    COAGS: No results for input(s): INR, APTT in the last 8760 hours.  BMP: Recent Labs    03/24/24 0540 03/25/24 1221 03/26/24 1313 03/30/24 0438  NA 130* 132* 132* 136  K 4.2 4.2 4.3 4.2  CL 96* 98 97* 101  CO2 25 24 25 24   GLUCOSE 96 174* 113* 109*  BUN 16 17 15 14   CALCIUM  9.1 8.9 8.9 9.4  CREATININE 0.92 0.97 1.09* 0.57  GFRNONAA >60 >60 53* >60    LIVER FUNCTION TESTS: Recent Labs    03/17/24 0711 03/20/24 0525 03/26/24 1313 03/30/24 0438  BILITOT 0.5 0.6 0.3 0.4  AST 59* 44* 31 25  ALT 85* 68* 41 30  ALKPHOS 129* 100 88 67  PROT 6.4* 6.0* 7.0 6.7  ALBUMIN  1.8* 1.7* 2.1* 2.2*    Assessment and Plan:   78 year old female with a history of post-operative left paracolic gutter abscess s/p drain placement in IR 03/23/24 with exchange/downsize  05/26/24. The drain was accidentally removed 06/02/24 with an unsuccessful attempt in IR at replacement 06/07/24.   The patient is overall doing well and she really only complained about having a low appetite. We discussed concerning signs/symptoms of a recurrent abscess and that she should call her surgical team if she develops worsening abdominal pain, fevers, chills, nausea, vomiting, etc.  The site of the prior LLQ drain has healed nicely.   Plan: Follow up with IR as needed.    Electronically Signed: Warren JONELLE Dais 06/16/2024, 2:17 PM   I spent a total of 15 Minutes in face to face in clinical consultation, greater than 50% of which was counseling/coordinating care for LLQ abscess.

## 2024-07-10 ENCOUNTER — Inpatient Hospital Stay

## 2024-07-10 ENCOUNTER — Inpatient Hospital Stay: Attending: Psychiatry | Admitting: Psychiatry

## 2024-07-10 ENCOUNTER — Encounter: Payer: Self-pay | Admitting: Psychiatry

## 2024-07-10 VITALS — BP 111/61 | HR 98 | Temp 97.4°F | Resp 18 | Wt 120.2 lb

## 2024-07-10 DIAGNOSIS — E46 Unspecified protein-calorie malnutrition: Secondary | ICD-10-CM | POA: Diagnosis not present

## 2024-07-10 DIAGNOSIS — Z933 Colostomy status: Secondary | ICD-10-CM | POA: Diagnosis not present

## 2024-07-10 DIAGNOSIS — N898 Other specified noninflammatory disorders of vagina: Secondary | ICD-10-CM | POA: Insufficient documentation

## 2024-07-10 DIAGNOSIS — R9389 Abnormal findings on diagnostic imaging of other specified body structures: Secondary | ICD-10-CM | POA: Diagnosis not present

## 2024-07-10 DIAGNOSIS — T8149XA Infection following a procedure, other surgical site, initial encounter: Secondary | ICD-10-CM | POA: Diagnosis not present

## 2024-07-10 DIAGNOSIS — R634 Abnormal weight loss: Secondary | ICD-10-CM | POA: Diagnosis not present

## 2024-07-10 DIAGNOSIS — K5900 Constipation, unspecified: Secondary | ICD-10-CM | POA: Insufficient documentation

## 2024-07-10 DIAGNOSIS — E119 Type 2 diabetes mellitus without complications: Secondary | ICD-10-CM | POA: Diagnosis not present

## 2024-07-10 DIAGNOSIS — R630 Anorexia: Secondary | ICD-10-CM | POA: Insufficient documentation

## 2024-07-10 LAB — PREALBUMIN: Prealbumin: 24 mg/dL (ref 18–38)

## 2024-07-10 LAB — CMP (CANCER CENTER ONLY)
ALT: 11 U/L (ref 0–44)
AST: 14 U/L — ABNORMAL LOW (ref 15–41)
Albumin: 3.9 g/dL (ref 3.5–5.0)
Alkaline Phosphatase: 48 U/L (ref 38–126)
Anion gap: 9 (ref 5–15)
BUN: 23 mg/dL (ref 8–23)
CO2: 26 mmol/L (ref 22–32)
Calcium: 11 mg/dL — ABNORMAL HIGH (ref 8.9–10.3)
Chloride: 102 mmol/L (ref 98–111)
Creatinine: 1.47 mg/dL — ABNORMAL HIGH (ref 0.44–1.00)
GFR, Estimated: 37 mL/min — ABNORMAL LOW (ref >60)
Glucose, Bld: 197 mg/dL — ABNORMAL HIGH (ref 70–99)
Potassium: 4.4 mmol/L (ref 3.5–5.1)
Sodium: 137 mmol/L (ref 135–145)
Total Bilirubin: 0.3 mg/dL (ref 0.0–1.2)
Total Protein: 8.2 g/dL — ABNORMAL HIGH (ref 6.5–8.1)

## 2024-07-10 MED ORDER — OLANZAPINE 5 MG PO TABS: 5.0000 mg | ORAL_TABLET | Freq: Every day | ORAL | 3 refills | Status: DC

## 2024-07-10 MED ORDER — OLANZAPINE 5 MG PO TABS
5.0000 mg | ORAL_TABLET | Freq: Every day | ORAL | 3 refills | Status: AC
  Filled 2024-07-21: qty 30, 30d supply, fill #0

## 2024-07-10 NOTE — Progress Notes (Signed)
 Gynecologic Oncology Return Clinic Visit  Date of Service: 07/10/2024 Referring Provider: Vina Solian, MD   Assessment & Plan: Alexandria Durham is a 78 y.o. woman with thickened endometrium, postmenopausal bleeding with hysteroscopy D&C on 02/29/24 complicated bowel perforation, returned to OR on 03/01/24 for xlap, partial colectomy, descending end colostomy. Postop course complicated by abscess with multi-drug resistant organisms.   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  Surgery may require an abdominal hysterectomy to be able to deal with any complex anatomy in the pelvis. -Reviewed that although she would ideally have improved nutritional status and functional status, given that we may be delaying diagnosis for a cancer, we will have to balance these 2 things. - Given her nutritional status and functional status, feel that surgery may be best performed in [facility] given the additional support and services available there.  Given this, we will refer her to [facility] colorectal surgery for potential joint procedure. - Will likely schedule a virtual visit once she has seen colorectal and we have discussed surgical plan.  Reviewed that likely this will be sometime in January to be able to coordinate everything. - CT abdomen/pelvis ordered to reevaluate ongoing fluid collections given that patient has had drain out.  We will also use this to evaluate for any evidence of local metastatic disease.  Decreased appetite, weight loss, protein calorie malnutrition: - Patient has had 20 pound weight loss since last check in August.  Patient reports that she is doing slightly better and is up 3 pounds since last week. - Reviewed protein supplementation. - Patient did not feel that Megace helped.  Open  to other options.  Prescription for Zyprexa sent. - Will collect CMP and prealbumin today.  Postop abscess: -Drain was incidentally pulled by patient in early October.  Not replaced. - No fevers or chills per patient.  Unclear if the drainage vaginally is in part a fistulous connection between her uterus and the rectal stump. - Repeat CT scan as above.  Ostomy: - Solid stool in ostomy on exam. - Recommend stool softener given appearance of stool is type II on the Bristol stool scale  Midline wound: - Now well-healed.   RTC tentative 2mo for preop planning  Hoy Masters, MD Gynecologic Oncology  Medical Decision Making I personally spent  TOTAL 60 minutes face-to-face and non-face-to-face in the care of this patient, which includes all pre, intra, and post visit time on the date of service.   ----------------------- Reason for Visit: Follow-up   Interval History: Since her last visit, patient has been following with IR for her drain.  The drain had been downsized with plan for follow-up evaluation, but patient reported that the drain came out on 06/02/2024.  She was then seen on 06/05/2024 at which time drain replacement was attempted but the tract had closed.  The drain site has since healed.  Patient was also evaluated by Dr. Debby in follow-up on 05/22/2024 at which time, based on her malnutrition and functional status she did not recommend colostomy reversal at this time to allow for better healing.  She was recommended Megace to help with appetite stimulation and protein shakes to increase calorie intake.  Today patient presents with her sister and niece.  She is currently living with her niece.  She feels that she is doing  a little bit better with eating but still has no appetite or taste.  Reports that she has gained 3 pounds since last week when measured by her home aide.  She tried the Megace prescribed by Dr. Debby but did not feel like this helped.  This was only a  30-day supply and has not continued.  She continues to have vaginal drainage that is yellowish-brown in color, not all the time.  Denies fevers or chills at home.  Reports that was coming out of her ostomy currently is more formed.    Past Medical/Surgical History: Past Medical History:  Diagnosis Date   Abscess of abdominal cavity (HCC) 04/25/2024   Arthritis    Chronic kidney disease    CKD 3a   COPD (chronic obstructive pulmonary disease) (HCC)    Diabetes mellitus    Family history of adverse reaction to anesthesia    sister woke up during her eye surgery.   GERD (gastroesophageal reflux disease)    Heart murmur    Hypercholesteremia    Hypertension    Hypothyroidism     Past Surgical History:  Procedure Laterality Date   BREAST CYST EXCISION Left    benign cyst   CHOLECYSTECTOMY N/A 01/17/2024   Procedure: LAPAROSCOPIC CHOLECYSTECTOMY WITH INTRAOPERATIVE CHOLANGIOGRAM;  Surgeon: Sheldon Standing, MD;  Location: [facility];  Service: General;  Laterality: N/A;   COLECTOMY WITH COLOSTOMY CREATION/HARTMANN PROCEDURE  03/01/2024   Procedure: COLECTOMY, WITH COLOSTOMY CREATION;  Surgeon: [name] Mays, MD;  Location: [facility];  Service: Gynecology;;   [name]  03/01/2024   Procedure: CYSTOSCOPY;  Surgeon: [name] Mays, MD;  Location: [facility];  Service: Gynecology;;   DILATION AND CURETTAGE OF UTERUS N/A 01/17/2024   Procedure: DILATION AND CURETTAGE;  Surgeon: [name] Mays, MD;  Location: [facility];  Service: Gynecology;  Laterality: N/A;   EYE SURGERY Left    cataract removal   HEMORRHOID SURGERY     HIP SURGERY     HYSTEROSCOPY WITH D & C N/A 01/06/2024   Procedure: DILATATION AND CURETTAGE /HYSTEROSCOPY;  Surgeon: Cleatus Moccasin, MD;  [facility]: [facility];  Service: Gynecology;  Laterality: N/A;  PAP SMEAR (DR TO BRING SUPPLIES)  TUCKERS CARD   HYSTEROSCOPY WITH D & C N/A 02/29/2024   Procedure: HYSTEROSCOPY WITH MYOSURE;  Surgeon: [name] Mays, MD;  Location: [facility];  Service:  Gynecology;  Laterality: N/A;  myosure, possible ultrasound guidance   IR CATHETER TUBE CHANGE  05/26/2024   IR CATHETER TUBE CHANGE  06/05/2024   IR RADIOLOGIST EVAL & MGMT  05/03/2024   IR RADIOLOGIST EVAL & MGMT  05/26/2024   IR RADIOLOGIST EVAL & MGMT  04/19/2024   IR RADIOLOGIST EVAL & MGMT  06/16/2024   JOINT REPLACEMENT     hip replacement   LAPAROSCOPIC LYSIS OF ADHESIONS  01/06/2024   Procedure: LYSIS, ADHESIONS, LAPAROSCOPIC;  Surgeon: Cleatus Moccasin, MD;  [facility]: [facility];  Service: Gynecology;;   LAPAROSCOPY N/A 01/06/2024   Procedure: LAPAROSCOPY, DIAGNOSTIC;  Surgeon: Cleatus Moccasin, MD;  [facility]: [facility];  Service: Gynecology;  Laterality: N/A;   LAPAROSCOPY N/A 02/29/2024   Procedure: LAPAROSCOPY, DIAGNOSTIC;  Surgeon: [name] Mays, MD;  [facility];  Service: Gynecology;  Laterality: N/A;   LAPAROTOMY N/A 03/01/2024   Procedure: LAPAROTOMY, EXPLORATORY;  Surgeon: [name] Mays, MD;  [facility]: [facility];  Service: Gynecology;  Laterality: N/A;  Possible Ileostomy   MOBILIZATION, SPLENIC FLEXURE, WITH PARTIAL COLECTOMY  03/01/2024   Procedure: MOBILIZATION, SPLENIC FLEXURE, WITH PARTIAL COLECTOMY;  Surgeon: [name],  Hoy, MD;  Location: [facility];  Service: Gynecology;;   OPERATIVE ULTRASOUND N/A 02/29/2024   Procedure: US  INTRAOPERATIVE;  Surgeon: [name] Hoy, MD;  [facility]: [facility];  Service: Gynecology;  Laterality: N/A;   SIGMOIDOSCOPY N/A 02/29/2024   Procedure: SIGMOIDOSCOPY;  Surgeon: Debby Hila, MD;  [facility]: [facility];  Service: General;  Laterality: N/A;  Rigid sigmoidoscopy   SIGMOIDOSCOPY  03/01/2024   Procedure: [place];  Surgeon: Debby Hila, MD;  Location: [facility];  Service: General;;   THYROID  SURGERY     TONSILLECTOMY     TUBAL LIGATION      Family History  Problem Relation Age of Onset   Breast cancer Neg Hx    Prostate cancer Neg Hx    Pancreatic cancer Neg Hx    Colon cancer Neg Hx    Endometrial cancer Neg Hx    Ovarian cancer Neg Hx      Social History   Socioeconomic History   Marital status: Single    Spouse name: Not on file   Number of children: Not on file   Years of education: Not on file   Highest education level: Not on file  Occupational History   Not on file  Tobacco Use   Smoking status: Former    Types: Cigarettes    Start date: 1967    Passive exposure: Never   Smokeless tobacco: Never  Vaping Use   Vaping status: Never Used  Substance and Sexual Activity   Alcohol use: No   Drug use: No   Sexual activity: Not Currently  Other Topics Concern   Not on file  Social History Narrative   Not on file   Social Drivers of Health   Financial Resource Strain: Not on file  Food Insecurity: No Food Insecurity (02/29/2024)   Hunger Vital Sign    Worried About Running Out of Food in the Last Year: Never true    Ran Out of Food in the Last Year: Never true  Transportation Needs: No Transportation Needs (02/29/2024)   PRAPARE - Administrator, Civil Service (Medical): No    Lack of Transportation (Non-Medical): No  Physical Activity: Not on file  Stress: Not on file  Social Connections: Patient Declined (02/29/2024)   Social Connection and Isolation Panel    Frequency of Communication with Friends and Family: Patient declined    Frequency of Social Gatherings with Friends and Family: Patient declined    Attends Religious Services: Patient declined    Database Administrator or Organizations: Patient declined    Attends Banker Meetings: Patient declined    Marital Status: Patient declined    Current Medications:  Current Outpatient Medications:    bisacodyl  5 MG EC tablet, Take 5 mg by mouth daily as needed for moderate constipation., Disp: , Rfl:    buPROPion  (WELLBUTRIN  XL) 150 MG 24 hr tablet, Take 150 mg by mouth every evening., Disp: , Rfl:    Cholecalciferol  (VITAMIN D3) 25 MCG (1000 UT) CAPS, Take 1,000 Units by mouth daily. (Patient not taking: Reported on 04/26/2024),  Disp: , Rfl:    ciprofloxacin  (CIPRO ) 500 MG tablet, Take 1 tablet (500 mg total) by mouth 2 (two) times daily., Disp: 60 tablet, Rfl: 1   Cyanocobalamin  (VITAMIN B-12 PO), Take 1 tablet by mouth in the morning., Disp: , Rfl:    diphenhydramine -acetaminophen  (TYLENOL  PM) 25-500 MG TABS tablet, Take 2 tablets by mouth at bedtime as needed (Sleep/Pain)., Disp: , Rfl:  ezetimibe  (ZETIA ) 10 MG tablet, Take 10 mg by mouth daily., Disp: , Rfl:    ferrous sulfate 325 (65 FE) MG tablet, Take 325 mg by mouth daily with breakfast. (Patient not taking: Reported on 04/26/2024), Disp: , Rfl:    fluconazole  (DIFLUCAN ) 100 MG tablet, Take 1 tablet (100 mg total) by mouth daily., Disp: 10 tablet, Rfl: 0   fluticasone  (FLONASE ) 50 MCG/ACT nasal spray, Place 2 sprays into both nostrils daily as needed for rhinitis., Disp: , Rfl:    hydrochlorothiazide  (HYDRODIURIL ) 25 MG tablet, Take 1 tablet (25 mg total) by mouth daily., Disp: 30 tablet, Rfl: 0   levothyroxine  (SYNTHROID ) 75 MCG tablet, Take 75 mcg by mouth daily before breakfast., Disp: , Rfl:    lidocaine  (LIDODERM ) 5 %, Place 1 patch onto the skin daily. Remove & Discard patch within 12 hours or as directed by MD. Apply to the right mid abdomen., Disp: 30 patch, Rfl: 1   losartan  (COZAAR ) 50 MG tablet, Take 50 mg by mouth in the morning., Disp: , Rfl:    magnesium  oxide (MAG-OX) 400 MG tablet, Take 400 mg by mouth daily., Disp: , Rfl:    methocarbamol  (ROBAXIN ) 500 MG tablet, Take 1 tablet (500 mg total) by mouth 2 (two) times daily as needed for muscle spasms., Disp: 14 tablet, Rfl: 0   metroNIDAZOLE  (FLAGYL ) 500 MG tablet, Take 1 tablet (500 mg total) by mouth 2 (two) times daily., Disp: 60 tablet, Rfl: 1   mirtazapine  (REMERON ) 7.5 MG tablet, Take 1 tablet (7.5 mg total) by mouth at bedtime., Disp: 30 tablet, Rfl: 3   mometasone (ELOCON) 0.1 % cream, Apply 1 Application topically daily as needed (Dermatitis- affected sites)., Disp: , Rfl:    Nutritional  Supplements (FEEDING SUPPLEMENT, KATE FARMS STANDARD 1.4,) LIQD liquid, Take 325 mLs by mouth 3 (three) times daily between meals., Disp: 325 mL, Rfl: 3   omeprazole (PRILOSEC) 40 MG capsule, Take 40 mg by mouth daily before breakfast., Disp: , Rfl:    ondansetron  (ZOFRAN ) 4 MG tablet, Take 1 tablet (4 mg total) by mouth every 6 (six) hours as needed for nausea., Disp: 20 tablet, Rfl: 0   oxyCODONE  10 MG TABS, Take 0.5-1 tablets (5-10 mg total) by mouth every 6 (six) hours as needed for moderate pain (pain score 4-6) (5 mg for moderate pain, 10 mg for severe pain per Dr. [name])., Disp: 30 tablet, Rfl: 0   polyethylene glycol (MIRALAX  / GLYCOLAX ) 17 g packet, Take 17 g by mouth daily. Do not give if ostomy output loose (Patient not taking: Reported on 04/26/2024), Disp: 14 each, Rfl: 0   sitaGLIPtin (JANUVIA) 50 MG tablet, Take 50 mg by mouth daily., Disp: , Rfl:    sodium chloride  0.9 % injection, 5 mLs by Other route 3 (three) times daily. Flush JP drain (abscess drain) with 5 cc sterile saline three times daily, Disp: 5 mL, Rfl: 60   triamcinolone ointment (KENALOG) 0.5 %, Apply 1 Application topically 2 (two) times daily as needed (for itching)., Disp: , Rfl:    TYLENOL  8 HOUR ARTHRITIS PAIN 650 MG CR tablet, Take 650-1,300 mg by mouth every 8 (eight) hours as needed for pain., Disp: , Rfl:   Review of Symptoms: Complete 10-system review is positive for: Increasing appetite, slight constipation, hip pain sometimes, problems sleeping, pelvic pain, sometimes numbness in her fingers, anxiety, fatigue, itching, some abdominal pain, dizziness, depression, weight loss/gain, slight pain with urination, problem walking  Physical Exam: BP 111/61 (BP Location:  Left Arm, Patient Position: Sitting)   Pulse 98   Temp (!) 97.4 F (36.3 C) (Oral)   Resp 18   Wt 120 lb 3.2 oz (54.5 kg)   SpO2 99%   BMI 20.63 kg/m  General: Alert, oriented, no acute distress. HEENT: Normocephalic, atraumatic. Neck  symmetric without masses. Sclera anicteric.  Chest: Normal work of breathing.  Cardiovascular: Regular rate Abdomen: Soft, nontender.  Normoactive bowel sounds.  Ostomy in left abdomen with retracted ostomy. Formed stool in ostomy. Midline dressing removed and midline wound completely healed. Prior drain site healed.  Skin: sacrum with intact skin Extremities: Grossly normal range of motion.  Warm, well perfused.  No edema bilaterally. GU: Normal external genitalia.  On speculum exam normal-appearing vaginal mucosa and cervix.  Yellowish discharge in vaginal vault.  Bimanual exam limited by patient discomfort.  Normal cervix.  Mild tenderness to palpation on bimanual exam. Exam chaperoned by Kimberly Jordan, CMA    Laboratory & Radiologic Studies: CT ABDOMEN PELVIS W CONTRAST 05/03/2024  Narrative CLINICAL DATA:  Status post percutaneous catheter drainage of left lower quadrant postoperative abscess on 03/23/2024. 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  EXAM: CT ABDOMEN AND PELVIS WITH CONTRAST  TECHNIQUE: Multidetector CT imaging of the abdomen and pelvis was performed using the standard protocol following bolus administration of intravenous contrast.  RADIATION DOSE REDUCTION: This exam was performed according to the departmental dose-optimization program which includes automated exposure control, adjustment of the mA and/or kV according to patient size and/or use of iterative reconstruction technique.  CONTRAST:  100mL ISOVUE -300 IOPAMIDOL  (ISOVUE -300) INJECTION 61%  COMPARISON:  04/19/2024  FINDINGS: Lower chest: No acute abnormality.  Hepatobiliary: No focal liver abnormality is seen. Status post cholecystectomy. No biliary dilatation.  Pancreas: Unremarkable. No pancreatic ductal dilatation or surrounding inflammatory changes.  Spleen: Normal in size without focal abnormality.  Adrenals/Urinary Tract: Adrenal glands are  unremarkable. Kidneys are normal, without renal calculi, focal lesion, or hydronephrosis. Bladder is unremarkable.  Stomach/Bowel: No bowel obstruction, significant ileus or free intraperitoneal air. Stable appearance of left lower quadrant colostomy.  Vascular/Lymphatic: Stable atherosclerosis of the abdominal aorta without aneurysm. No lymphadenopathy identified.  Reproductive: Stable fluid in the endometrial canal.  Other: Stable positioning of left pelvic percutaneous drainage catheter posterior to the lower descending colon. No further abscess identified.  Musculoskeletal: No acute or significant osseous findings.  IMPRESSION: 1. Stable positioning of left pelvic percutaneous drainage catheter posterior to the lower descending colon. No further abscess identified. 2. Stable fluid in the endometrial canal. 3. Stable appearance of left lower quadrant colostomy. 4. Aortic atherosclerosis.   Electronically Signed By: Marcey Moan M.D. On: 05/03/2024 12:59

## 2024-07-10 NOTE — Patient Instructions (Addendum)
 It was a pleasure to see you in clinic today. - CT scan will get scheduled - We will send you to the [facility] colorectal doctors - I will send a prescription for a medication called zyprexa for appetite - Return visit planned for 2 months in case we dont' have surgery scheduled at [facility] yet  Thank you very much for allowing me to provide care for you today.  I appreciate your confidence in choosing our Gynecologic Oncology team at [facility].  If you have any questions about your visit today please call our office or send us  a MyChart message and we will get back to you as soon as possible.

## 2024-07-11 ENCOUNTER — Ambulatory Visit: Payer: Self-pay | Admitting: Psychiatry

## 2024-07-11 DIAGNOSIS — N179 Acute kidney failure, unspecified: Secondary | ICD-10-CM

## 2024-07-11 NOTE — Telephone Encounter (Signed)
 Per Dr.Newton, I reached out to Alexandria Durham and reviewed recent lab results as reported by Dr.Newton.   Pt voiced an understanding and agrees to a lab appointment. Alexandria Durham requested I call her sister Alexandria Durham. I called her and relayed lab results. She will make sure Alexandria Durham stays hydrated and will bring her next week to have labs drawn.   Lab appointment made on Monday 11/17 @ 12:45. She also has a CT appointment that day.

## 2024-07-11 NOTE — Telephone Encounter (Signed)
-----   Message from [place], [place] sent at 07/11/2024  2:02 PM EST ----- Please let patient know that her nutrition levels on her labs are improved. Her kidney function is a little abnormal. Please encourage hydration and I would like to reach labs in 1 week. Orders in  for CMP and A1c. ----- Message ----- From: Interface, Lab In [place] Sent: 07/10/2024   4:38 PM EST To: Hoy Masters, MD

## 2024-07-17 ENCOUNTER — Ambulatory Visit (HOSPITAL_COMMUNITY)
Admission: RE | Admit: 2024-07-17 | Discharge: 2024-07-17 | Disposition: A | Discharge status: Home / Self Care | Source: Ambulatory Visit | Attending: Psychiatry | Admitting: Psychiatry

## 2024-07-17 ENCOUNTER — Inpatient Hospital Stay

## 2024-07-17 DIAGNOSIS — R9389 Abnormal findings on diagnostic imaging of other specified body structures: Secondary | ICD-10-CM | POA: Diagnosis not present

## 2024-07-17 DIAGNOSIS — T8149XA Infection following a procedure, other surgical site, initial encounter: Secondary | ICD-10-CM | POA: Insufficient documentation

## 2024-07-17 DIAGNOSIS — E119 Type 2 diabetes mellitus without complications: Secondary | ICD-10-CM

## 2024-07-17 DIAGNOSIS — N179 Acute kidney failure, unspecified: Secondary | ICD-10-CM

## 2024-07-17 LAB — CMP (CANCER CENTER ONLY)
ALT: 10 U/L (ref 0–44)
AST: 16 U/L (ref 15–41)
Albumin: 3.8 g/dL (ref 3.5–5.0)
Alkaline Phosphatase: 45 U/L (ref 38–126)
Anion gap: 9 (ref 5–15)
BUN: 18 mg/dL (ref 8–23)
CO2: 27 mmol/L (ref 22–32)
Calcium: 11 mg/dL — ABNORMAL HIGH (ref 8.9–10.3)
Chloride: 105 mmol/L (ref 98–111)
Creatinine: 1.34 mg/dL — ABNORMAL HIGH (ref 0.44–1.00)
GFR, Estimated: 41 mL/min — ABNORMAL LOW (ref >60)
Glucose, Bld: 101 mg/dL — ABNORMAL HIGH (ref 70–99)
Potassium: 4.6 mmol/L (ref 3.5–5.1)
Sodium: 141 mmol/L (ref 135–145)
Total Bilirubin: 0.4 mg/dL (ref 0.0–1.2)
Total Protein: 8.1 g/dL (ref 6.5–8.1)

## 2024-07-17 LAB — HEMOGLOBIN A1C
Hgb A1c MFr Bld: 6 % — ABNORMAL HIGH (ref 4.8–5.6)
Mean Plasma Glucose: 125.5 mg/dL

## 2024-07-17 MED ORDER — SODIUM CHLORIDE (PF) 0.9 % IJ SOLN
INTRAMUSCULAR | Status: AC
  Filled 2024-07-17: qty 50

## 2024-07-17 MED ORDER — IOHEXOL 300 MG/ML  SOLN
80.0000 mL | Freq: Once | INTRAMUSCULAR | Status: AC | PRN
  Administered 2024-07-17: 80 mL via INTRAVENOUS

## 2024-07-17 MED ORDER — IOHEXOL 9 MG/ML PO SOLN
ORAL | Status: AC
Start: 2024-07-17 — End: 2024-07-17
  Filled 2024-07-17: qty 1000

## 2024-07-17 MED ORDER — IOHEXOL 9 MG/ML PO SOLN
500.0000 mL | ORAL | Status: AC
  Administered 2024-07-17 (×2): 500 mL via ORAL

## 2024-07-18 NOTE — Telephone Encounter (Signed)
-----   Message from [place], [place] sent at 07/17/2024  5:42 PM EST ----- Please call pt to relay result note message. Thanks. ----- Message ----- From: [place]a, Lab In [place] Sent: 07/17/2024   1:47 PM EST To: Hoy Masters, MD

## 2024-07-18 NOTE — Telephone Encounter (Signed)
 Alexandria Durham is aware of the recent lab results as reported by Alexandria Epps NP. She asked me to call her sister Alexandria Durham. Alexandria Durham is aware of results and the need to follow up with Alexandria Durham PCP. She asked that results be faxed to that office for an upcoming appointment. Lab results faxed.

## 2024-07-20 ENCOUNTER — Other Ambulatory Visit (HOSPITAL_COMMUNITY): Payer: Self-pay

## 2024-07-21 ENCOUNTER — Other Ambulatory Visit (HOSPITAL_COMMUNITY): Payer: Self-pay

## 2024-07-24 ENCOUNTER — Other Ambulatory Visit (HOSPITAL_COMMUNITY): Payer: Self-pay

## 2024-07-24 NOTE — Progress Notes (Signed)
 New Patient Clinic History and Physical  Date: 07/24/24   Chief Complaint: End colostomy, hoping for takedown  ATTENDING ATTESTATION:Patient seen and examined at bedside. Agree with the resident note with the following addition/changes: Ms. Alexandria Durham is a lovely kind 78yo F presenting for evaluation for ostomy takedown in conjunction with Dr. [name].  Overall, she is quite debilitated since her surgery.  I do think she is a reasonable candidate for takedown but in the future having reviewed her CT scans and understanding her anatomy.  She has lost a significant amount of weight as well as some functionality since her surgery.  I would like to see her continue to make progress in both realms prior to undergoing ostomy closure in conjunctino with hysterectomy.  I have asked the she return to clinic prior in the new year and we will re-assess her readiness for major abdominal surgery.She had a colonoscopy in 2024 and does not require repeat c-scope prior to surgery.  Prior CT imaging reveals long colonic stump- although above rectum sigmoid appears thickened and decompressed portentially reflecting chronic diverticulitis- we likely would resect her remaining sigmoid and perform colorectal anastomosis  Alexandria CHRISTELLA Bock, MD Colorectal Surgery ===========================================================================  History of Present Illness:   Alexandria Durham is a 78 y.o. female who presents for consideration of colostomy takedown at the referral of her gynecologic oncologist Dr. Hoy [name]. Jeni was frequently in and out of the hospital this summer, undergoing multiple surgeries and procedures (detailed below). 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 She developed intra-abdominal abscess in L paracolic gutter requiring IR drainage and antibiotic treatment. This drain fell out in October  and has not been replaced.  Since discharge, Emiley has been slowly gaining weight and becoming more independent. Last spring she weighed about 175 lbs, but lost a significant amount of weight when hospitalized that she fell to only 115 lbs. Today, she is back up to 126 lbs and is determined to continue gaining weight and improving nutritionally. She previously was able to walk up stairs and around the block without problem, but now is unable to walk much more than 10 feet without becoming fatigued. She was discharged to her sister's home for extra support, but now is nearly entirely independent with ADLs.   She does have daily colostomy output, though it is scant in amount and very firm. She previously would go 7d at a time before having a bowel movement. She was discharged on Miralax , though stopped using this as she did not believe it was still working. She has not had any troubles with pouching. She does have occasional mucous per rectum, but no persistent drainage per rectum or vagina.   ? D&C at [facility] 5/4 - abdominal pain n/v, mild diverticulitis, ceftriaxone  and flagyl  5/8 - D&C for possible endometritis, uterine perforation 5/19 - D&C, cholecystectomy  7/1 - D&C, hysteroscopy 7/2 - ex-lap, partial colectomy and end colostomy 7/24 - drain placement by IR in L paracolic abscess  Past colonoscopy: 04/12/23 - 4 sub centimeter polyps removed, moderate pan-colonic diverticulosis  Imaging Findings: CT imaging not available via powershare  Past Medical History[1]  Past Surgical History[2]   Allergies[3]  Current Medications[4]  Family History[5]  Short Social History[6]  Review of Systems:  General: No fevers, weight loss, fatigue  HEENT: No visual changes, hearing changes CV: No chest pain, palpitations Resp: No shortness of breath GI: No nausea, vomiting,  or diarrhea; endorses constipation GU: No dysuria, hematuria  MSK: No muscle aches, arthritis Neuro: No  numbness, tingling  Psych: No depression, anxiety  Physical Exam: BP 146/64   Pulse 95   Temp 36.3 C (97.3 F)   Ht 162.6 cm (5' 4.02)   Wt 57.2 kg (126 lb 3.2 oz)   BMI 21.65 kg/m   General: Frail elderly lady sitting upright on exam bed CV: Regular rate, normotensive Resp: Normal work of breathing on room air Abd: Colostomy in LLQ with small stool balls in appliance, midline laparotomy incision well healed, no distension or tenderness to palpation Extremities: No peripheral edema  Assessment/Plan: Alexandria Durham is a 78 y.o. female patient who presents to clinic today for consideration of colostomy reversal. She experienced a prolonged hospitalization in July after injury to sigmoid colon during [facility] requiring sigmoid colectomy and end colostomy creation. She is gaining weight again and able to perform ADLs independently, but she has not nearly recovered to her prior functional status. Further surgical planning pending imaging findings.   - Discussed elective colostomy takedown and timing being at least 41mo after index operation - Continue to improve nutritional and functional status in anticipation of colostomy takedown - Return to clinic on 09/04/24 to evaluate progress - Request images to be resent via PowerShare as shared images were of incorrect patient  Damien Ewings, MD, MD  Colorectal Surgery        [1] Past Medical History: Diagnosis Date  . Arthritis   . Asymmetric septal hypertrophy (RAF-HCC)--with minimal mid ventricular gradient 04/30/2016  . Chronic obstructive pulmonary disease (CMS-HCC) 04/04/2024  . CKD (chronic kidney disease) stage 3, GFR 30-59 ml/min (CMS-HCC) 01/01/2016   NOT due to diabetes - May 2017 Micral was neg/normal   . Gastroesophageal reflux disease without esophagitis 03/02/2016  . 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 Headache   . Murmur 03/24/2016  . Shortness of breath   . 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 BREAST SURGERY  1972   cyst removal  . COLONOSCOPY    . ESOPHAGOGASTRODUODENOSCOPY    . free vascularized tibula graft     prior to THR  . HEMORRHOID SURGERY    . INCISION AND DRAINAGE HIP Right   . JOINT REPLACEMENT  1998   Right hip   . PR THYROIDECTOMY Bilateral 03/11/2017   Procedure: BILATERAL THYROIDECTOMY FOR GOITER WITH NERVE MONITER;  Surgeon: Oneil Curtistine Cao, MD;  Location: OR [facility];  Service: General Surgery  . TONSILLECTOMY    . TUBAL LIGATION     x 2  [3] Allergies Allergen Reactions  . Ace Inhibitors Other (See Comments)    unknown  . Aspirin Palpitations  . Codeine Palpitations  . Penicillins Rash  [4] Current Outpatient Medications  Medication Sig Dispense Refill  . acetaminophen  (TYLENOL ) 650 MG CR tablet Take 650 mg by mouth every eight (8) hours as needed for pain.    [name] ammonium lactate (AMLACTIN) 12 % cream Apply topically as needed for dry skin.    [name] atorvastatin (LIPITOR) 20 MG tablet Take 1 tablet (20 mg total) by mouth daily. (Patient taking differently: Take 20 mg by mouth nightly. ) 90 tablet 3  . buPROPion  (WELLBUTRIN  SR) 150 MG 12 hr tablet Take 150 mg by mouth daily.     . cholecalciferol , vitamin D3, (VITAMIN D3) 1,000 unit capsule Take 1,000 Units by mouth daily.    . docusate sodium  (COLACE) 50 MG  capsule Take by mouth Two (2) times a day.    . fluticasone  (FLONASE ) 50 mcg/actuation nasal spray USE 2 SPRAYS IN EACH  NOSTRIL DAILY 16 g 3  . gabapentin  (NEURONTIN ) 100 MG capsule Take 1-2 capsules three times daily. (Patient taking differently: Take 200 mg by mouth Three (3) times a day. Take 1-2 capsules three times daily.) 180 capsule 3  . hydroCHLOROthiazide  (HYDRODIURIL ) 25 MG tablet Take 1 tablet (25 mg total) by mouth daily. 90 tablet 3  . levothyroxine  (SYNTHROID , LEVOTHROID) 88 MCG tablet Take 1 tablet (88 mcg total) by mouth daily at 0600. 90 tablet 3  . losartan  (COZAAR ) 50 MG tablet Take 1 tablet  (50 mg total) by mouth daily. 30 tablet 0  . magnesium  oxide (MAG-OX) 400 mg tablet Take 400 mg by mouth daily.    . magnesium  oxide (MAG-OX) 400 mg tablet Take 400 mg by mouth.    . metFORMIN (GLUCOPHAGE-XR) 500 MG 24 hr tablet Take 1 tablet (500 mg total) by mouth every morning before breakfast. (Patient taking differently: Take 500 mg by mouth daily with evening meal. ) 90 tablet 3  . omeprazole (PRILOSEC) 20 MG capsule Take 1 capsule (20 mg total) by mouth daily. 90 capsule 3  . polyethylene glycol (MIRALAX ) 17 gram packet Take 17 g by mouth daily.    [name] tiZANidine (ZANAFLEX) 2 MG tablet Take 1 tablet (2 mg total) by mouth nightly. Take 1-2 tablets at bedtime (Patient taking differently: Take 2 mg by mouth nightly as needed. Take 1-2 tablets at bedtime ) 180 tablet 3  . traMADol  (ULTRAM ) 50 mg tablet Take 1 tablet (50 mg total) by mouth every six (6) hours as needed for pain. 30 tablet 0  . vitamin E acetate (VITAMIN E ORAL) Take by mouth daily.     No current facility-administered medications for this visit.  [5] Family History Problem Relation Age of Onset  . Thyroid  disease Daughter        her daughter has had a thyroidectomy for a goiter  . Ulcers Father        he died around the age of 78 from a perforated ulcer  . Coronary artery disease Mother        she died at the age of 76 from an MI: she also had a cerebral aneursym  . Aneurysm Mother   . Arthritis Mother   . Diabetes Mother   . Heart disease Mother   . Heart attack Mother   . Hypertension Mother   . Thyroid  disease Maternal Aunt        she had a goiter  . Hypertension Maternal Aunt   . Stroke Maternal Aunt   . Aneurysm Sister   . Diabetes Sister   . Hypertension Brother   . Cancer Son   . Cancer Paternal Uncle   . Asthma Sister   . Diabetes Brother   . Hypertension Brother   . Heart disease Brother   . Hypertension Sister   . Hypertension Maternal Grandmother   . Stroke Maternal Grandmother   . Cancer Other    . Migraines Other   [6] Social History Tobacco Use  . Smoking status: Every Day    Current packs/day: 0.25    Average packs/day: 0.3 packs/day for 53.0 years (13.3 ttl pk-yrs)    Types: Cigarettes  . Smokeless tobacco: Never  . Tobacco comments:    5 per day  Substance Use Topics  . Alcohol use: Yes    Alcohol/week:  0.0 standard drinks of alcohol    Comment: very seldom a beer  . Drug use: No

## 2024-07-31 ENCOUNTER — Encounter: Payer: Self-pay | Admitting: Infectious Disease

## 2024-07-31 DIAGNOSIS — K632 Fistula of intestine: Secondary | ICD-10-CM | POA: Insufficient documentation

## 2024-07-31 NOTE — Progress Notes (Deleted)
 Subjective:  Chief Complaint:follow-up for intrabdominal abscesses   Patient ID: Alexandria Durham, female    DOB: Dec 21, 1945, 78 y.o.   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   GERD (gastroesophageal reflux disease)    Heart murmur    Hypercholesteremia    Hypertension    Hypothyroidism     Past Surgical History:  Procedure Laterality Date   BREAST CYST EXCISION Left    benign cyst   CHOLECYSTECTOMY N/A 01/17/2024   Procedure: LAPAROSCOPIC CHOLECYSTECTOMY WITH INTRAOPERATIVE CHOLANGIOGRAM;  Surgeon: Sheldon Standing, MD;  Location: [facility];  Service: General;  Laterality: N/A;   COLECTOMY WITH COLOSTOMY CREATION/HARTMANN PROCEDURE  03/01/2024   Procedure: COLECTOMY, WITH COLOSTOMY CREATION;  Surgeon: [name] Mays, MD;  Location: [facility];  Service: Gynecology;;   [name]  03/01/2024   Procedure: CYSTOSCOPY;  Surgeon: [name] Mays, MD;  Location: [facility];  Service: Gynecology;;   DILATION AND CURETTAGE OF UTERUS N/A 01/17/2024   Procedure: DILATION AND CURETTAGE;  Surgeon: [name] Mays, MD;  Location: [facility];  Service: Gynecology;  Laterality: N/A;   EYE SURGERY Left    cataract removal   HEMORRHOID SURGERY     HIP SURGERY     HYSTEROSCOPY WITH D & C N/A 01/06/2024   Procedure: DILATATION AND CURETTAGE /HYSTEROSCOPY;  Surgeon: Cleatus Moccasin, MD;  Location: [facility];  Service: Gynecology;  Laterality: N/A;  PAP SMEAR (DR TO BRING SUPPLIES)  TUCKERS CARD   HYSTEROSCOPY WITH D & C N/A 02/29/2024   Procedure: HYSTEROSCOPY WITH MYOSURE;  Surgeon: [name] Mays, MD;  Location: [facility];  Service: Gynecology;  Laterality: N/A;  myosure, possible ultrasound guidance    IR CATHETER TUBE CHANGE  05/26/2024   IR CATHETER TUBE CHANGE  06/05/2024   IR RADIOLOGIST EVAL & MGMT  05/03/2024   IR RADIOLOGIST EVAL & MGMT  05/26/2024   IR RADIOLOGIST EVAL & MGMT  04/19/2024   IR RADIOLOGIST EVAL & MGMT  06/16/2024   JOINT REPLACEMENT     hip replacement   LAPAROSCOPIC LYSIS OF ADHESIONS  01/06/2024   Procedure: LYSIS, ADHESIONS, LAPAROSCOPIC;  Surgeon: Cleatus Moccasin, MD;  Location: [facility];  Service: Gynecology;;   LAPAROSCOPY N/A 01/06/2024   Procedure: LAPAROSCOPY, DIAGNOSTIC;  Surgeon: Cleatus Moccasin, MD;  Location: [facility];  Service: Gynecology;  Laterality: N/A;   LAPAROSCOPY N/A 02/29/2024   Procedure: LAPAROSCOPY, DIAGNOSTIC;  Surgeon: [name] Mays, MD;  Location: [facility];  Service: Gynecology;  Laterality: N/A;   LAPAROTOMY N/A 03/01/2024   Procedure: LAPAROTOMY, EXPLORATORY;  Surgeon: [name] Mays, MD;  Location: [facility];  Service: Gynecology;  Laterality: N/A;  Possible Ileostomy   MOBILIZATION, SPLENIC FLEXURE, WITH PARTIAL COLECTOMY  03/01/2024   Procedure: MOBILIZATION, SPLENIC FLEXURE, WITH PARTIAL COLECTOMY;  Surgeon: [name] Mays, MD;  Location: [facility];  Service: Gynecology;;   OPERATIVE ULTRASOUND N/A 02/29/2024   Procedure: US  INTRAOPERATIVE;  Surgeon: [name] Mays, MD;  Location: [facility];  Service: Gynecology;  Laterality: N/A;   SIGMOIDOSCOPY N/A 02/29/2024   Procedure: SIGMOIDOSCOPY;  Surgeon: Debby Hila, MD;  Location: [facility];  Service: General;  Laterality: N/A;  Rigid sigmoidoscopy   SIGMOIDOSCOPY  03/01/2024   Procedure: SIGMOIDOSCOPY;  Surgeon: Debby Hila, MD;  Location: [facility];  Service: General;;   THYROID  SURGERY     TONSILLECTOMY     TUBAL LIGATION      Family History  Problem Relation Age of Onset   Breast cancer Neg Hx    Prostate cancer Neg Hx    Pancreatic cancer Neg Hx    Colon cancer Neg Hx    Endometrial cancer Neg Hx    Ovarian cancer Neg Hx       Social History   Socioeconomic History   Marital status:  Widowed    Spouse name: Not on file   Number of children: Not on file   Years of education: Not on file   Highest education level: Not on file  Occupational History   Not on file  Tobacco Use   Smoking status: Former    Types: Cigarettes    Start date: 1967    Passive exposure: Never   Smokeless tobacco: Never  Vaping Use   Vaping status: Never Used  Substance and Sexual Activity   Alcohol use: No   Drug use: No   Sexual activity: Not Currently  Other Topics Concern   Not on file  Social History Narrative   Not on file   Social Drivers of Health   Financial Resource Strain: Not on file  Food Insecurity: No Food Insecurity (02/29/2024)   Hunger Vital Sign    Worried About Running Out of Food in the Last Year: Never true    Ran Out of Food in the Last Year: Never true  Transportation Needs: No Transportation Needs (02/29/2024)   PRAPARE - Administrator, Civil Service (Medical): No    Lack of Transportation (Non-Medical): No  Physical Activity: Not on file  Stress: Not on file  Social Connections: Patient Declined (02/29/2024)   Social Connection and Isolation Panel    Frequency of Communication with Friends and Family: Patient declined    Frequency of Social Gatherings with Friends and Family: Patient declined    Attends Religious Services: Patient declined    Database Administrator or Organizations: Patient declined    Attends Banker Meetings: Patient declined    Marital Status: Patient declined    Allergies  Allergen Reactions   Ace Inhibitors Hives and Itching   Tape Other (See Comments)    Skin is sensitive   Aspirin Palpitations   Codeine Palpitations   Merrem  [Meropenem ] Rash     Current Outpatient Medications:    bisacodyl  5 MG EC tablet, Take 5 mg by mouth daily as needed for moderate constipation., Disp: , Rfl:    buPROPion  (WELLBUTRIN  XL) 150 MG 24 hr tablet, Take 150 mg by mouth every evening., Disp: , Rfl:    Cholecalciferol   (VITAMIN D3) 25 MCG (1000 UT) CAPS, Take 1,000 Units by mouth daily. (Patient not taking: Reported on 04/26/2024), Disp: , Rfl:    ciprofloxacin  (CIPRO ) 500 MG tablet, Take 1 tablet (500 mg total) by mouth 2 (two) times daily., Disp: 60 tablet, Rfl: 1   Cyanocobalamin  (VITAMIN B-12 PO), Take 1 tablet by mouth in the morning., Disp: , Rfl:    diphenhydramine -acetaminophen  (TYLENOL  PM) 25-500 MG TABS tablet, Take 2 tablets by mouth at bedtime as needed (Sleep/Pain)., Disp: , Rfl:    ezetimibe  (ZETIA ) 10 MG tablet, Take 10 mg by mouth daily., Disp: , Rfl:    ferrous sulfate 325 (65 FE) MG tablet, Take 325 mg  by mouth daily with breakfast. (Patient not taking: Reported on 04/26/2024), Disp: , Rfl:    fluconazole  (DIFLUCAN ) 100 MG tablet, Take 1 tablet (100 mg total) by mouth daily., Disp: 10 tablet, Rfl: 0   fluticasone  (FLONASE ) 50 MCG/ACT nasal spray, Place 2 sprays into both nostrils daily as needed for rhinitis., Disp: , Rfl:    hydrochlorothiazide  (HYDRODIURIL ) 25 MG tablet, Take 1 tablet (25 mg total) by mouth daily., Disp: 30 tablet, Rfl: 0   levothyroxine  (SYNTHROID ) 75 MCG tablet, Take 75 mcg by mouth daily before breakfast., Disp: , Rfl:    lidocaine  (LIDODERM ) 5 %, Place 1 patch onto the skin daily. Remove & Discard patch within 12 hours or as directed by MD. Apply to the right mid abdomen., Disp: 30 patch, Rfl: 1   losartan  (COZAAR ) 50 MG tablet, Take 50 mg by mouth in the morning., Disp: , Rfl:    magnesium  oxide (MAG-OX) 400 MG tablet, Take 400 mg by mouth daily., Disp: , Rfl:    methocarbamol  (ROBAXIN ) 500 MG tablet, Take 1 tablet (500 mg total) by mouth 2 (two) times daily as needed for muscle spasms., Disp: 14 tablet, Rfl: 0   metroNIDAZOLE  (FLAGYL ) 500 MG tablet, Take 1 tablet (500 mg total) by mouth 2 (two) times daily., Disp: 60 tablet, Rfl: 1   mirtazapine  (REMERON ) 7.5 MG tablet, Take 1 tablet (7.5 mg total) by mouth at bedtime., Disp: 30 tablet, Rfl: 3   mometasone (ELOCON) 0.1 %  cream, Apply 1 Application topically daily as needed (Dermatitis- affected sites)., Disp: , Rfl:    Nutritional Supplements (FEEDING SUPPLEMENT, KATE FARMS STANDARD 1.4,) LIQD liquid, Take 325 mLs by mouth 3 (three) times daily between meals., Disp: 325 mL, Rfl: 3   OLANZapine  (ZYPREXA ) 5 MG tablet, Take 1 tablet (5 mg total) by mouth at bedtime., Disp: 30 tablet, Rfl: 3   omeprazole (PRILOSEC) 40 MG capsule, Take 40 mg by mouth daily before breakfast., Disp: , Rfl:    ondansetron  (ZOFRAN ) 4 MG tablet, Take 1 tablet (4 mg total) by mouth every 6 (six) hours as needed for nausea., Disp: 20 tablet, Rfl: 0   oxyCODONE  10 MG TABS, Take 0.5-1 tablets (5-10 mg total) by mouth every 6 (six) hours as needed for moderate pain (pain score 4-6) (5 mg for moderate pain, 10 mg for severe pain per Dr. [name])., Disp: 30 tablet, Rfl: 0   polyethylene glycol (MIRALAX  / GLYCOLAX ) 17 g packet, Take 17 g by mouth daily. Do not give if ostomy output loose (Patient not taking: Reported on 04/26/2024), Disp: 14 each, Rfl: 0   sitaGLIPtin (JANUVIA) 50 MG tablet, Take 50 mg by mouth daily., Disp: , Rfl:    sodium chloride  0.9 % injection, 5 mLs by Other route 3 (three) times daily. Flush JP drain (abscess drain) with 5 cc sterile saline three times daily, Disp: 5 mL, Rfl: 60   triamcinolone ointment (KENALOG) 0.5 %, Apply 1 Application topically 2 (two) times daily as needed (for itching)., Disp: , Rfl:    TYLENOL  8 HOUR ARTHRITIS PAIN 650 MG CR tablet, Take 650-1,300 mg by mouth every 8 (eight) hours as needed for pain., Disp: , Rfl:    Review of Systems     Objective:   Physical Exam        Assessment & Plan:

## 2024-08-01 ENCOUNTER — Ambulatory Visit: Payer: Self-pay | Admitting: Infectious Disease

## 2024-08-01 ENCOUNTER — Telehealth: Payer: Self-pay | Admitting: Psychiatry

## 2024-08-01 DIAGNOSIS — S3769XD Other injury of uterus, subsequent encounter: Secondary | ICD-10-CM

## 2024-08-01 DIAGNOSIS — K5792 Diverticulitis of intestine, part unspecified, without perforation or abscess without bleeding: Secondary | ICD-10-CM

## 2024-08-01 DIAGNOSIS — K632 Fistula of intestine: Secondary | ICD-10-CM

## 2024-08-01 DIAGNOSIS — K651 Peritoneal abscess: Secondary | ICD-10-CM

## 2024-08-01 NOTE — Telephone Encounter (Signed)
 Called pt regarding her recent visit with Dr. Fenton at [facility]. Have gotten CT images from [facility] moved over to [facility] system so they are available for reviewed. PT currently with no abdominal pain, diarrhea, fevers/chills. Pt informed of reasons to notify us  (worsening abdominal pain, fevers, etc.). She will keep her follow-up with me on 1/12 as scheduled. All questions answered.

## 2024-09-11 ENCOUNTER — Inpatient Hospital Stay: Attending: Psychiatry | Admitting: Psychiatry

## 2024-09-11 VITALS — BP 146/82 | HR 65 | Temp 98.4°F | Resp 18 | Wt 141.0 lb

## 2024-09-11 DIAGNOSIS — N950 Postmenopausal bleeding: Secondary | ICD-10-CM | POA: Insufficient documentation

## 2024-09-11 DIAGNOSIS — R9389 Abnormal findings on diagnostic imaging of other specified body structures: Secondary | ICD-10-CM | POA: Insufficient documentation

## 2024-09-11 DIAGNOSIS — Z7189 Other specified counseling: Secondary | ICD-10-CM

## 2024-09-11 DIAGNOSIS — K5792 Diverticulitis of intestine, part unspecified, without perforation or abscess without bleeding: Secondary | ICD-10-CM

## 2024-09-11 NOTE — Progress Notes (Unsigned)
 Gynecologic Oncology Return Clinic Visit  Date of Service: 09/11/2024 Referring Provider: Vina Solian, MD   Assessment & Plan: Alexandria Durham is a 79 y.o. woman with thickened endometrium, postmenopausal bleeding with hysteroscopy D&C on 02/29/24 complicated bowel perforation, returned to OR on 03/01/24 for xlap, partial colectomy, descending end colostomy. Postop course complicated by abscess with multi-drug resistant organisms.   *** Hyst, bso, slnbx, poss lad, poss laparotomy Oxycodone  itch Not sure if hydrocodone  or dilaudid  itched as bad Dilaudid  she thinks worked well Ct scan prior to surgery (cone > import to [facility])   PMB, thickened endometrium - Given unable to sample lining, patient requires hysterectomy - Given ostomy, prior pelvic infection and concern for possible fistula from her rectal stump, discussed that surgery may be complex in order to perform hysterectomy.  Surgery may require an abdominal hysterectomy to be able to deal with any complex anatomy in the pelvis. -Reviewed that although she would ideally have improved nutritional status and functional status, given that we may be delaying diagnosis for a cancer, we will have to balance these 2 things. - Given her nutritional status and functional status, feel that surgery may be best performed in [facility] given the additional support and services available there.  Given this, we will refer her to [facility] colorectal surgery for potential joint procedure. - Will likely schedule a virtual visit once she has seen colorectal and we have discussed surgical plan.  Reviewed that likely this will be sometime in January to be able to coordinate everything. - CT abdomen/pelvis ordered to reevaluate ongoing fluid collections given that patient has had drain out.  We will also use this to evaluate for any evidence of local metastatic disease.  Decreased appetite, weight loss, protein calorie malnutrition: - Patient has had 20 pound weight  loss since last check in August.  Patient reports that she is doing slightly better and is up 3 pounds since last week. - Reviewed protein supplementation. - Patient did not feel that Megace helped.  Open to other options.  Prescription for Zyprexa  sent. - Will collect CMP and prealbumin today.  Postop abscess: -Drain was incidentally pulled by patient in early October.  Not replaced. - No fevers or chills per patient.  Unclear if the drainage vaginally is in part a fistulous connection between her uterus and the rectal stump. - Repeat CT scan as above.  Ostomy: - Solid stool in ostomy on exam. - Recommend stool softener given appearance of stool is type II on the Bristol stool scale  Midline wound: - Now well-healed.   RTC ***  Hoy Masters, MD Gynecologic Oncology  Medical Decision Making I personally spent  TOTAL *** minutes face-to-face and non-face-to-face in the care of this patient, which includes all pre, intra, and post visit time on the date of service.   ----------------------- Reason for Visit: Follow-up   Interval History: *** Feeling a lot better, more energy Strted olanzapine  nightly, helping Staying with sister Walking on own around house and most when she is out. Sometimes cane No fevers, feels cold No bleeding or discharge Ostomy, putting out well, some irritation around it someimtes Sees Dr. Fenton on 09/14/24 Prefers Tuesdays Thursdays for surgeyr    ***Since her last visit, patient has been following with IR for her drain.  The drain had been downsized with plan for follow-up evaluation, but patient reported that the drain came out on 06/02/2024.  She was then seen on 06/05/2024 at which time drain replacement was attempted  but the tract had closed.  The drain site has since healed.  Patient was also evaluated by Dr. Debby in follow-up on 05/22/2024 at which time, based on her malnutrition and functional status she did not recommend colostomy reversal  at this time to allow for better healing.  She was recommended Megace to help with appetite stimulation and protein shakes to increase calorie intake.  Today patient presents with her sister and niece.  She is currently living with her niece.  She feels that she is doing a little bit better with eating but still has no appetite or taste.  Reports that she has gained 3 pounds since last week when measured by her home aide.  She tried the Megace prescribed by Dr. Debby but did not feel like this helped.  This was only a 30-day supply and has not continued.  She continues to have vaginal drainage that is yellowish-brown in color, not all the time.  Denies fevers or chills at home.  Reports that was coming out of her ostomy currently is more formed.    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   GERD (gastroesophageal reflux disease)    Heart murmur    Hypercholesteremia    Hypertension    Hypothyroidism     Past Surgical History:  Procedure Laterality Date   BREAST CYST EXCISION Left    benign cyst   CHOLECYSTECTOMY N/A 01/17/2024   Procedure: LAPAROSCOPIC CHOLECYSTECTOMY WITH INTRAOPERATIVE CHOLANGIOGRAM;  Surgeon: Sheldon Standing, MD;  Location: [facility];  Service: General;  Laterality: N/A;   COLECTOMY WITH COLOSTOMY CREATION/HARTMANN PROCEDURE  03/01/2024   Procedure: COLECTOMY, WITH COLOSTOMY CREATION;  Surgeon: [name] Mays, MD;  Location: [facility];  Service: Gynecology;;   [name]  03/01/2024   Procedure: CYSTOSCOPY;  Surgeon: [name] Mays, MD;  Location: [facility];  Service: Gynecology;;   DILATION AND CURETTAGE OF UTERUS N/A 01/17/2024   Procedure: DILATION  AND CURETTAGE;  Surgeon: [name] Mays, MD;  Location: [facility];  Service: Gynecology;  Laterality: N/A;   EYE SURGERY Left    cataract removal   HEMORRHOID SURGERY     HIP SURGERY     HYSTEROSCOPY WITH D & C N/A 01/06/2024   Procedure: DILATATION AND CURETTAGE /HYSTEROSCOPY;  Surgeon: Cleatus Moccasin, MD;  Location: [facility];  Service: Gynecology;  Laterality: N/A;  PAP SMEAR (DR TO BRING SUPPLIES)  TUCKERS CARD   HYSTEROSCOPY WITH D & C N/A 02/29/2024   Procedure: HYSTEROSCOPY WITH MYOSURE;  Surgeon: [name] Mays, MD;  Location: [facility];  Service: Gynecology;  Laterality: N/A;  myosure, possible ultrasound guidance   IR CATHETER TUBE CHANGE  05/26/2024   IR CATHETER TUBE CHANGE  06/05/2024   IR RADIOLOGIST EVAL & MGMT  05/03/2024   IR RADIOLOGIST EVAL & MGMT  05/26/2024   IR RADIOLOGIST EVAL & MGMT  04/19/2024   IR RADIOLOGIST EVAL & MGMT  06/16/2024   JOINT REPLACEMENT     hip replacement   LAPAROSCOPIC LYSIS OF ADHESIONS  01/06/2024   Procedure: LYSIS, ADHESIONS, LAPAROSCOPIC;  Surgeon: Cleatus Moccasin, MD;  Location: [facility];  Service: Gynecology;;   LAPAROSCOPY N/A 01/06/2024  Procedure: LAPAROSCOPY, DIAGNOSTIC;  Surgeon: Cleatus Moccasin, MD;  Location: [facility];  Service: Gynecology;  Laterality: N/A;   LAPAROSCOPY N/A 02/29/2024   Procedure: LAPAROSCOPY, DIAGNOSTIC;  Surgeon: [name] Mays, MD;  Location: [facility];  Service: Gynecology;  Laterality: N/A;   LAPAROTOMY N/A 03/01/2024   Procedure: LAPAROTOMY, EXPLORATORY;  Surgeon: [name] Mays, MD;  Location: [facility];  Service: Gynecology;  Laterality: N/A;  Possible Ileostomy   MOBILIZATION, SPLENIC FLEXURE, WITH PARTIAL COLECTOMY  03/01/2024   Procedure: MOBILIZATION, SPLENIC FLEXURE, WITH PARTIAL COLECTOMY;  Surgeon: [name] Mays, MD;  Location: [facility];  Service: Gynecology;;   OPERATIVE ULTRASOUND N/A 02/29/2024   Procedure: US  INTRAOPERATIVE;  Surgeon: [name] Mays, MD;  Location: [facility];  Service: Gynecology;  Laterality: N/A;    SIGMOIDOSCOPY N/A 02/29/2024   Procedure: SIGMOIDOSCOPY;  Surgeon: Debby Hila, MD;  Location: [facility];  Service: General;  Laterality: N/A;  Rigid sigmoidoscopy   SIGMOIDOSCOPY  03/01/2024   Procedure: [place];  Surgeon: Debby Hila, MD;  Location: [facility];  Service: General;;   THYROID  SURGERY     TONSILLECTOMY     TUBAL LIGATION      Family History  Problem Relation Age of Onset   Breast cancer Neg Hx    Prostate cancer Neg Hx    Pancreatic cancer Neg Hx    Colon cancer Neg Hx    Endometrial cancer Neg Hx    Ovarian cancer Neg Hx     Social History   Socioeconomic History   Marital status: Widowed    Spouse name: Not on file   Number of children: Not on file   Years of education: Not on file   Highest education level: Not on file  Occupational History   Not on file  Tobacco Use   Smoking status: Former    Average packs/day: 0.5 packs/day    Types: Cigarettes    Start date: 48    Passive exposure: Never   Smokeless tobacco: Never  Vaping Use   Vaping status: Never Used  Substance and Sexual Activity   Alcohol use: No   Drug use: No   Sexual activity: Not Currently  Other Topics Concern   Not on file  Social History Narrative   Not on file   Social Drivers of Health   Tobacco Use: High Risk (07/24/2024)   [facility]   Patient History    Smoking Tobacco Use: Every Day    Smokeless Tobacco Use: Never    Passive Exposure: Not on file  Financial Resource Strain: Not on file  Food Insecurity: No Food Insecurity (02/29/2024)   Epic    Worried About Programme Researcher, Broadcasting/film/video in the Last Year: Never true    Ran Out of Food in the Last Year: Never true  Transportation Needs: No Transportation Needs (02/29/2024)   Epic    Lack of Transportation (Medical): No    Lack of Transportation (Non-Medical): No  Physical Activity: Not on file  Stress: Not on file  Social Connections: Patient Declined (02/29/2024)   Social Connection and Isolation Panel     Frequency of Communication with Friends and Family: Patient declined    Frequency of Social Gatherings with Friends and Family: Patient declined    Attends Religious Services: Patient declined    Database Administrator or Organizations: Patient declined    Attends Banker Meetings: Patient declined    Marital Status: Patient declined  Depression (PHQ2-9): Not on file  Alcohol Screen: Not  on file  Housing: High Risk (02/29/2024)   Epic    Unable to Pay for Housing in the Last Year: Yes    Number of Times Moved in the Last Year: 0    Homeless in the Last Year: No  Utilities: Not At Risk (02/29/2024)   Epic    Threatened with loss of utilities: No  Recent Concern: Utilities - At Risk (02/09/2024)   [facility] Utilities    Threatened with loss of utilities: Yes  Health Literacy: Not on file    Current Medications:  Current Outpatient Medications:    bisacodyl  5 MG EC tablet, Take 5 mg by mouth daily as needed for moderate constipation., Disp: , Rfl:    buPROPion  (WELLBUTRIN  XL) 150 MG 24 hr tablet, Take 150 mg by mouth every evening., Disp: , Rfl:    Cholecalciferol  (VITAMIN D3) 25 MCG (1000 UT) CAPS, Take 1,000 Units by mouth daily., Disp: , Rfl:    Cyanocobalamin  (VITAMIN B-12 PO), Take 1 tablet by mouth in the morning., Disp: , Rfl:    diphenhydramine -acetaminophen  (TYLENOL  PM) 25-500 MG TABS tablet, Take 2 tablets by mouth at bedtime as needed (Sleep/Pain)., Disp: , Rfl:    ezetimibe  (ZETIA ) 10 MG tablet, Take 10 mg by mouth daily., Disp: , Rfl:    fluticasone  (FLONASE ) 50 MCG/ACT nasal spray, Place 2 sprays into both nostrils daily as needed for rhinitis., Disp: , Rfl:    levothyroxine  (SYNTHROID ) 75 MCG tablet, Take 75 mcg by mouth daily before breakfast., Disp: , Rfl:    losartan  (COZAAR ) 50 MG tablet, Take 50 mg by mouth in the morning., Disp: , Rfl:    magnesium  oxide (MAG-OX) 400 MG tablet, Take 400 mg by mouth daily., Disp: , Rfl:    meclizine (ANTIVERT) 25 MG tablet,  Take 25 mg by mouth 3 (three) times daily as needed for dizziness., Disp: , Rfl:    mometasone (ELOCON) 0.1 % cream, Apply 1 Application topically daily as needed (Dermatitis- affected sites)., Disp: , Rfl:    Nutritional Supplements (FEEDING SUPPLEMENT, KATE FARMS STANDARD 1.4,) LIQD liquid, Take 325 mLs by mouth 3 (three) times daily between meals., Disp: 325 mL, Rfl: 3   OLANZapine  (ZYPREXA ) 5 MG tablet, Take 1 tablet (5 mg total) by mouth at bedtime., Disp: 30 tablet, Rfl: 3   omeprazole (PRILOSEC) 40 MG capsule, Take 40 mg by mouth daily before breakfast., Disp: , Rfl:    polyethylene glycol (MIRALAX  / GLYCOLAX ) 17 g packet, Take 17 g by mouth daily. Do not give if ostomy output loose, Disp: 14 each, Rfl: 0   triamcinolone ointment (KENALOG) 0.5 %, Apply 1 Application topically 2 (two) times daily as needed (for itching)., Disp: , Rfl:    TYLENOL  8 HOUR ARTHRITIS PAIN 650 MG CR tablet, Take 650-1,300 mg by mouth every 8 (eight) hours as needed for pain., Disp: , Rfl:    fluconazole  (DIFLUCAN ) 100 MG tablet, Take 1 tablet (100 mg total) by mouth daily. (Patient not taking: Reported on 09/05/2024), Disp: 10 tablet, Rfl: 0   hydrochlorothiazide  (HYDRODIURIL ) 25 MG tablet, Take 1 tablet (25 mg total) by mouth daily. (Patient not taking: Reported on 09/05/2024), Disp: 30 tablet, Rfl: 0   methocarbamol  (ROBAXIN ) 500 MG tablet, Take 1 tablet (500 mg total) by mouth 2 (two) times daily as needed for muscle spasms. (Patient not taking: Reported on 09/05/2024), Disp: 14 tablet, Rfl: 0   metroNIDAZOLE  (FLAGYL ) 500 MG tablet, Take 1 tablet (500 mg total) by mouth 2 (two) times  daily. (Patient not taking: Reported on 09/05/2024), Disp: 60 tablet, Rfl: 1   mirtazapine  (REMERON ) 7.5 MG tablet, Take 1 tablet (7.5 mg total) by mouth at bedtime. (Patient not taking: Reported on 09/05/2024), Disp: 30 tablet, Rfl: 3   ondansetron  (ZOFRAN ) 4 MG tablet, Take 1 tablet (4 mg total) by mouth every 6 (six) hours as needed for nausea.  (Patient not taking: Reported on 09/05/2024), Disp: 20 tablet, Rfl: 0   oxyCODONE  10 MG TABS, Take 0.5-1 tablets (5-10 mg total) by mouth every 6 (six) hours as needed for moderate pain (pain score 4-6) (5 mg for moderate pain, 10 mg for severe pain per Dr. [name]). (Patient not taking: Reported on 09/05/2024), Disp: 30 tablet, Rfl: 0   sitaGLIPtin (JANUVIA) 50 MG tablet, Take 50 mg by mouth daily. (Patient not taking: Reported on 09/05/2024), Disp: , Rfl:    sodium chloride  0.9 % injection, 5 mLs by Other route 3 (three) times daily. Flush JP drain (abscess drain) with 5 cc sterile saline three times daily (Patient not taking: Reported on 09/05/2024), Disp: 5 mL, Rfl: 60  Review of Symptoms: Complete 10-system review is positive for: joint pain, vision problems, dizziness, problem with walking  Physical Exam: BP (!) 146/82 Comment: manual recheck, no S&S will monitor and f/u with PCP  Pulse 65   Temp 98.4 F (36.9 C) (Oral)   Resp 18   Wt 141 lb (64 kg)   SpO2 99%   BMI 24.20 kg/m  General: Alert, oriented, no acute distress. HEENT: Normocephalic, atraumatic. Neck symmetric without masses. Sclera anicteric.  Chest: Normal work of breathing.  Cardiovascular: Regular rate Abdomen: Soft, nontender.  Normoactive bowel sounds.  Ostomy in left abdomen with retracted ostomy. Soft brown stool in ostomy. Midline wound completely healed. Prior drain site healed.  Skin: sacrum with intact skin Extremities: Grossly normal range of motion.  Warm, well perfused.  No edema bilaterally. GU: Normal external genitalia.  On speculum exam normal-appearing vaginal mucosa and cervix.  No abnormal discharge.  Bimanual exam limited by patient discomfort.  Normal cervix.  Mild tenderness to palpation on bimanual exam. Exam chaperoned by Kimberly Jordan, CMA     Laboratory & Radiologic Studies: ***

## 2024-09-11 NOTE — Patient Instructions (Signed)
 It was good seeing you today.   Dr. [name] will reach out to Dr. Fenton to follow up after your visit today.  She will probably recommend having a CT scan at [facility] prior to surgery for updated imaging.   Please call the office for any questions or concerns.

## 2024-09-13 ENCOUNTER — Encounter: Payer: Self-pay | Admitting: Psychiatry

## 2024-09-15 ENCOUNTER — Other Ambulatory Visit (HOSPITAL_COMMUNITY): Payer: Self-pay

## 2024-09-29 ENCOUNTER — Other Ambulatory Visit: Payer: Self-pay | Admitting: Gynecologic Oncology

## 2024-09-29 DIAGNOSIS — N950 Postmenopausal bleeding: Secondary | ICD-10-CM

## 2024-09-29 DIAGNOSIS — R9389 Abnormal findings on diagnostic imaging of other specified body structures: Secondary | ICD-10-CM

## 2024-09-29 DIAGNOSIS — Z933 Colostomy status: Secondary | ICD-10-CM

## 2024-10-03 ENCOUNTER — Telehealth: Payer: Self-pay | Admitting: *Deleted

## 2024-10-03 NOTE — Telephone Encounter (Signed)
 Spoke with the patient and gave the appt date/time for the CT scan

## 2024-10-10 ENCOUNTER — Ambulatory Visit (HOSPITAL_COMMUNITY)
